# Patient Record
Sex: Female | Born: 1965
Health system: Southern US, Community
[De-identification: ages and names within clinical notes are randomized; demographics above are authoritative.]

## PROBLEM LIST (undated history)

## (undated) DIAGNOSIS — F329 Major depressive disorder, single episode, unspecified: Secondary | ICD-10-CM

## (undated) DIAGNOSIS — F32A Depression, unspecified: Secondary | ICD-10-CM

## (undated) DIAGNOSIS — R42 Dizziness and giddiness: Secondary | ICD-10-CM

## (undated) DIAGNOSIS — E079 Disorder of thyroid, unspecified: Secondary | ICD-10-CM

## (undated) DIAGNOSIS — R51 Headache: Secondary | ICD-10-CM

## (undated) DIAGNOSIS — T7840XA Allergy, unspecified, initial encounter: Secondary | ICD-10-CM

## (undated) DIAGNOSIS — M48061 Spinal stenosis, lumbar region without neurogenic claudication: Secondary | ICD-10-CM

## (undated) DIAGNOSIS — M79605 Pain in left leg: Secondary | ICD-10-CM

## (undated) DIAGNOSIS — K219 Gastro-esophageal reflux disease without esophagitis: Secondary | ICD-10-CM

## (undated) DIAGNOSIS — H9209 Otalgia, unspecified ear: Secondary | ICD-10-CM

## (undated) DIAGNOSIS — I739 Peripheral vascular disease, unspecified: Secondary | ICD-10-CM

## (undated) DIAGNOSIS — F419 Anxiety disorder, unspecified: Secondary | ICD-10-CM

## (undated) HISTORY — PX: OTHER SURGICAL HISTORY: SHX169

## (undated) HISTORY — DX: Anxiety disorder, unspecified: F41.9

## (undated) HISTORY — DX: Disorder of thyroid, unspecified: E07.9

## (undated) HISTORY — DX: Pain in left leg: M79.605

## (undated) HISTORY — DX: Dizziness and giddiness: R42

## (undated) HISTORY — DX: Headache: R51

## (undated) HISTORY — PX: CHOLECYSTECTOMY: SHX55

## (undated) HISTORY — DX: Peripheral vascular disease, unspecified: I73.9

## (undated) HISTORY — DX: Depression, unspecified: F32.A

## (undated) HISTORY — DX: Major depressive disorder, single episode, unspecified: F32.9

## (undated) HISTORY — DX: Allergy, unspecified, initial encounter: T78.40XA

## (undated) HISTORY — PX: VAGINAL HYSTERECTOMY: SUR661

## (undated) HISTORY — PX: BACK SURGERY: SHX140

## (undated) HISTORY — PX: ABDOMINAL HYSTERECTOMY: SHX81

---

## 2005-03-28 ENCOUNTER — Emergency Department (HOSPITAL_COMMUNITY): Admission: EM | Admit: 2005-03-28 | Discharge: 2005-03-28 | Payer: Self-pay | Admitting: Emergency Medicine

## 2005-04-03 ENCOUNTER — Encounter: Admission: RE | Admit: 2005-04-03 | Discharge: 2005-04-03 | Payer: Self-pay | Admitting: Emergency Medicine

## 2005-11-20 ENCOUNTER — Emergency Department (HOSPITAL_COMMUNITY): Admission: EM | Admit: 2005-11-20 | Discharge: 2005-11-20 | Payer: Self-pay | Admitting: Emergency Medicine

## 2005-11-25 ENCOUNTER — Emergency Department (HOSPITAL_COMMUNITY): Admission: EM | Admit: 2005-11-25 | Discharge: 2005-11-25 | Payer: Self-pay | Admitting: Family Medicine

## 2006-09-21 ENCOUNTER — Other Ambulatory Visit: Admission: RE | Admit: 2006-09-21 | Discharge: 2006-09-21 | Payer: Self-pay | Admitting: Gynecology

## 2007-09-27 ENCOUNTER — Other Ambulatory Visit: Admission: RE | Admit: 2007-09-27 | Discharge: 2007-09-27 | Payer: Self-pay | Admitting: Gynecology

## 2009-04-13 ENCOUNTER — Encounter (INDEPENDENT_AMBULATORY_CARE_PROVIDER_SITE_OTHER): Payer: Self-pay | Admitting: Obstetrics and Gynecology

## 2009-04-13 ENCOUNTER — Ambulatory Visit (HOSPITAL_COMMUNITY): Admission: RE | Admit: 2009-04-13 | Discharge: 2009-04-13 | Payer: Self-pay | Admitting: Obstetrics and Gynecology

## 2009-09-14 ENCOUNTER — Encounter: Admission: RE | Admit: 2009-09-14 | Discharge: 2009-09-14 | Payer: Self-pay | Admitting: Gastroenterology

## 2009-10-11 ENCOUNTER — Ambulatory Visit (HOSPITAL_COMMUNITY): Admission: RE | Admit: 2009-10-11 | Discharge: 2009-10-11 | Payer: Self-pay | Admitting: Gastroenterology

## 2009-11-23 ENCOUNTER — Ambulatory Visit (HOSPITAL_COMMUNITY): Admission: RE | Admit: 2009-11-23 | Discharge: 2009-11-23 | Payer: Self-pay | Admitting: General Surgery

## 2010-05-01 ENCOUNTER — Encounter: Admission: RE | Admit: 2010-05-01 | Discharge: 2010-05-01 | Payer: Self-pay | Admitting: Obstetrics and Gynecology

## 2010-05-20 ENCOUNTER — Encounter (INDEPENDENT_AMBULATORY_CARE_PROVIDER_SITE_OTHER): Payer: Self-pay | Admitting: Obstetrics and Gynecology

## 2010-05-20 ENCOUNTER — Ambulatory Visit (HOSPITAL_COMMUNITY): Admission: RE | Admit: 2010-05-20 | Discharge: 2010-05-21 | Payer: Self-pay | Admitting: Obstetrics and Gynecology

## 2011-01-18 LAB — CBC
Hemoglobin: 10.7 g/dL — ABNORMAL LOW (ref 12.0–15.0)
MCH: 28.7 pg (ref 26.0–34.0)
MCV: 81.8 fL (ref 78.0–100.0)
RBC: 3.73 MIL/uL — ABNORMAL LOW (ref 3.87–5.11)
WBC: 7.3 10*3/uL (ref 4.0–10.5)

## 2011-01-18 LAB — HCG, SERUM, QUALITATIVE: Preg, Serum: NEGATIVE

## 2011-01-19 LAB — CBC
HCT: 38.2 % (ref 36.0–46.0)
Hemoglobin: 13 g/dL (ref 12.0–15.0)
MCV: 80.7 fL (ref 78.0–100.0)
RBC: 4.73 MIL/uL (ref 3.87–5.11)
WBC: 5.3 10*3/uL (ref 4.0–10.5)

## 2011-01-20 LAB — COMPREHENSIVE METABOLIC PANEL
Albumin: 3.7 g/dL (ref 3.5–5.2)
BUN: 19 mg/dL (ref 6–23)
Calcium: 9.1 mg/dL (ref 8.4–10.5)
Creatinine, Ser: 0.63 mg/dL (ref 0.4–1.2)
Total Bilirubin: 0.4 mg/dL (ref 0.3–1.2)
Total Protein: 7.5 g/dL (ref 6.0–8.3)

## 2011-01-20 LAB — DIFFERENTIAL
Eosinophils Absolute: 0 10*3/uL (ref 0.0–0.7)
Eosinophils Relative: 0 % (ref 0–5)
Lymphocytes Relative: 34 % (ref 12–46)
Lymphs Abs: 2.2 10*3/uL (ref 0.7–4.0)
Neutrophils Relative %: 59 % (ref 43–77)

## 2011-01-20 LAB — CBC
HCT: 35.3 % — ABNORMAL LOW (ref 36.0–46.0)
Hemoglobin: 11.8 g/dL — ABNORMAL LOW (ref 12.0–15.0)
MCHC: 33.6 g/dL (ref 30.0–36.0)
MCV: 79.3 fL (ref 78.0–100.0)
Platelets: 283 10*3/uL (ref 150–400)
RDW: 12.8 % (ref 11.5–15.5)

## 2011-02-10 LAB — CBC
MCV: 80.1 fL (ref 78.0–100.0)
RBC: 4.47 MIL/uL (ref 3.87–5.11)
WBC: 4.2 10*3/uL (ref 4.0–10.5)

## 2011-03-18 NOTE — Op Note (Signed)
Kendra Rodriguez, Kendra Rodriguez      ACCOUNT NO.:  [id number]   MEDICAL RECORD NO.:  [id number]          PATIENT TYPE:  AMB   LOCATION:  SDC                           FACILITY:  [facility]   PHYSICIAN:  Osborn Coho, M.D.   DATE OF BIRTH:  [birth year]   DATE OF PROCEDURE:  04/13/2009  DATE OF DISCHARGE:                               OPERATIVE REPORT   PREOPERATIVE DIAGNOSES:  1. Menorrhagia.  2. Endometrial and endocervical polyp.   POSTOPERATIVE DIAGNOSES:  1. Menorrhagia.  2. Endometrial and endocervical polyp.   PROCEDURES:  1. Hysteroscopy.  2. Dilation and curettage.  3. Polypectomy.  4. NovaSure ablation.   SURGEON:  Osborn Coho, MD   ANESTHESIA:  General via LMA.   SPECIMENS TO PATHOLOGY:  1. Endometrial curettings.  2. Endocervical polyps.   HYSTEROSCOPIC FLUID DEFICIT:  LR 35 mL, fluids 1400 mL.   URINE OUTPUT:  Quantity sufficient via straight cath prior to procedure.   ESTIMATED BLOOD LOSS:  Minimal.   COMPLICATIONS:  None.   FINDINGS:  Approximately 1-2 cm endocervical polyp.  Uterus sounded to 9-  1/2 cm.  Cervical length measured 3-1/2 cm.  Cavity length measured 6  cm.  Cavity width measured 4.7 cm.  Power for the ablation was 55 watts  and length of the ablation was 45 seconds.   PROCEDURE:  The patient was taken to the operating room after risks,  benefits, and alternatives discussed with the patient.  The patient  verbalized understanding, consent signed and witnessed.  This was done  via translation with her daughter and previously with Day Surgery staff  and head of the translator.  The patient was placed under general  anesthesia and prepped and draped in the normal sterile fashion in the  dorsal lithotomy position.  A bivalve speculum was placed in the  patient's vagina and a paracervical block administered using a total of  10 mL of 1% lidocaine.  The anterior lip of the cervix was grasped with  single-tooth tenacula and the measurements as  noted above.  The  hysteroscope was introduced after polypectomy performed on the  endocervical polyp which was clearly visualized after the speculum was  placed.  This was protruding out of this external cervical os.  The  hysteroscope was introduced and no obvious intracavitary lesions were  noted.  The hysteroscope was removed and curettage was performed and a  gritty texture was noted.  The NovaSure instrument was then placed into  the endometrial cavity and ablation performed without difficulty with  the measurements as noted above.  The NovaSure instrument was removed  and hysteroscope replaced into the endometrial cavity and good ablation  results were noted.  All instruments were removed.  There was good  hemostasis at the tenaculum site.  Count was correct.  The patient  tolerated the procedure well and is currently awaiting transfer to the  recovery room in good condition.      Osborn Coho, M.D.  Electronically Signed     AR/MEDQ  D:  04/13/2009  T:  04/14/2009  Job:  [id number]

## 2011-03-20 ENCOUNTER — Other Ambulatory Visit: Payer: Self-pay | Admitting: Dermatology

## 2011-05-15 ENCOUNTER — Other Ambulatory Visit: Payer: Self-pay | Admitting: Internal Medicine

## 2011-05-15 DIAGNOSIS — R1013 Epigastric pain: Secondary | ICD-10-CM

## 2011-05-22 ENCOUNTER — Ambulatory Visit
Admission: RE | Admit: 2011-05-22 | Discharge: 2011-05-22 | Disposition: A | Discharge status: Home / Self Care | Payer: BC Managed Care – PPO | Source: Ambulatory Visit | Attending: Internal Medicine | Admitting: Internal Medicine

## 2011-05-22 DIAGNOSIS — R1013 Epigastric pain: Secondary | ICD-10-CM

## 2011-05-29 ENCOUNTER — Other Ambulatory Visit: Payer: Self-pay | Admitting: Internal Medicine

## 2011-05-29 DIAGNOSIS — N6321 Unspecified lump in the left breast, upper outer quadrant: Secondary | ICD-10-CM

## 2011-06-04 ENCOUNTER — Ambulatory Visit
Admission: RE | Admit: 2011-06-04 | Discharge: 2011-06-04 | Disposition: A | Discharge status: Home / Self Care | Payer: BC Managed Care – PPO | Source: Ambulatory Visit | Attending: Internal Medicine | Admitting: Internal Medicine

## 2011-06-04 DIAGNOSIS — N6321 Unspecified lump in the left breast, upper outer quadrant: Secondary | ICD-10-CM

## 2011-08-21 ENCOUNTER — Other Ambulatory Visit: Payer: Self-pay

## 2011-08-21 DIAGNOSIS — M79609 Pain in unspecified limb: Secondary | ICD-10-CM

## 2011-09-08 ENCOUNTER — Encounter: Payer: Self-pay | Admitting: Vascular Surgery

## 2011-09-15 ENCOUNTER — Encounter: Payer: Self-pay | Admitting: Vascular Surgery

## 2011-09-16 ENCOUNTER — Ambulatory Visit (INDEPENDENT_AMBULATORY_CARE_PROVIDER_SITE_OTHER): Payer: BC Managed Care – PPO | Admitting: Vascular Surgery

## 2011-09-16 ENCOUNTER — Other Ambulatory Visit (INDEPENDENT_AMBULATORY_CARE_PROVIDER_SITE_OTHER): Payer: BC Managed Care – PPO | Admitting: *Deleted

## 2011-09-16 ENCOUNTER — Encounter: Payer: Self-pay | Admitting: Vascular Surgery

## 2011-09-16 VITALS — BP 112/71 | HR 77 | Resp 18 | Ht 63.0 in | Wt 192.0 lb

## 2011-09-16 DIAGNOSIS — M79609 Pain in unspecified limb: Secondary | ICD-10-CM

## 2011-09-16 NOTE — Progress Notes (Signed)
Subjective:     Patient ID: Kendra Rodriguez, female   DOB: April 08, 1966, 45 y.o.   MRN: [id number]  HPI this 45 year old female patient was referred for evaluation of bilateral leg pain . Her daughter accompanies her who speaks [place] and serves as a Nurse, learning disability. The patient has pain in both lower extremities sometimes worse on the right sometimes worse on the left. This occurs only while sitting or lying supine but never while ambulating. Ambulation relieved the pain. She has no history of back pain or lumbar disc disease. She has no history of cellulitis, infection, gangrene, deep vein thrombosis, or thrombophlebitis. The discomfort begins in the hips and extends down into the thighs. She occasionally has numbness in the feet. Assessment going on for 3-4 months and is worse than it was at the onset.  Past Medical History  Diagnosis Date  . Dizziness   . Acute sinusitis   . Peripheral vascular disease   . Leg pain, left     with swelling    History  Substance Use Topics  . Smoking status: Never Smoker   . Smokeless tobacco: Never Used  . Alcohol Use: No    Family History  Problem Relation Age of Onset  . Hypertension Mother   . Arthritis Mother     No Known Allergies  Current outpatient prescriptions:dexlansoprazole (DEXILANT) 60 MG capsule, Take 60 mg by mouth daily.  , Disp: , Rfl: ;  sulfamethoxazole-trimethoprim (BACTRIM DS) 800-160 MG per tablet, Take 1 tablet by mouth every 12 (twelve) hours.  , Disp: , Rfl: ;  triamcinolone (NASACORT) 55 MCG/ACT nasal inhaler, Place 2 sprays into the nose daily.  , Disp: , Rfl:   BP 112/71  Pulse 77  Resp 18  Ht 5\' 3"  (1.6 m)  Wt 192 lb (87.091 kg)  BMI 34.01 kg/m2  Body mass index is 34.01 kg/(m^2).        Review of Systems she complains of dizziness, headaches, decreased vision, joint pain, chest pain, chest tightness, dyspnea on exertion, urinary frequency, dysuria, reflux esophagitis, and diarrhea. All other systems are negative  and a complete review of    Objective:   Physical Exam or pressure 112/71 heart rate 77 respirations 18 General she is well-developed well-nourished female in no apparent distress. She is alert and oriented x3 HEENT normal for age Lungs no rhonchi or wheezing Cardiovascular regular rhythm no murmurs carotid pulses 3+ no bruits Abdomen soft nontender with no masses Neurologic normal Joints no obvious deformities Skin free of rashes Lower extremity exam reveals 3+ femoral popliteal dorsalis pedis and posterior tibial pulses palpable. The feet are warm and well perfused.  Today I ordered lower extremity arterial Dopplers which I reviewed and interpreted. She has excellent triphasic flow in both feet with ABIs exceeding 1.1 bilaterally-this is a normal study    Assessment:    bilateral radicular leg pain relieved by ambulation-sounds like nerve compression syndrome no evidence of vascular occlusive disease    Plan:     Patient will return to see her health care provider to determine if referral to orthopedic or neuro surgeon is indicated to rule out lumbar spine disease No need for further vascular evaluation

## 2011-10-07 ENCOUNTER — Other Ambulatory Visit: Payer: Self-pay | Admitting: Internal Medicine

## 2011-10-09 ENCOUNTER — Ambulatory Visit
Admission: RE | Admit: 2011-10-09 | Discharge: 2011-10-09 | Disposition: A | Discharge status: Home / Self Care | Payer: BC Managed Care – PPO | Source: Ambulatory Visit | Attending: Internal Medicine | Admitting: Internal Medicine

## 2012-01-27 ENCOUNTER — Encounter (INDEPENDENT_AMBULATORY_CARE_PROVIDER_SITE_OTHER): Payer: BC Managed Care – PPO | Admitting: Obstetrics and Gynecology

## 2012-01-27 DIAGNOSIS — N644 Mastodynia: Secondary | ICD-10-CM

## 2012-01-27 DIAGNOSIS — N390 Urinary tract infection, site not specified: Secondary | ICD-10-CM

## 2012-01-27 DIAGNOSIS — B373 Candidiasis of vulva and vagina: Secondary | ICD-10-CM

## 2012-01-27 DIAGNOSIS — B3731 Acute candidiasis of vulva and vagina: Secondary | ICD-10-CM

## 2012-01-28 ENCOUNTER — Ambulatory Visit (INDEPENDENT_AMBULATORY_CARE_PROVIDER_SITE_OTHER): Payer: BC Managed Care – PPO | Admitting: Physician Assistant

## 2012-01-28 ENCOUNTER — Other Ambulatory Visit: Payer: Self-pay | Admitting: Obstetrics and Gynecology

## 2012-01-28 VITALS — BP 127/69 | HR 77 | Temp 97.8°F | Resp 16 | Ht 62.75 in | Wt 194.0 lb

## 2012-01-28 DIAGNOSIS — Z23 Encounter for immunization: Secondary | ICD-10-CM

## 2012-01-28 DIAGNOSIS — M79609 Pain in unspecified limb: Secondary | ICD-10-CM

## 2012-01-28 DIAGNOSIS — S61209A Unspecified open wound of unspecified finger without damage to nail, initial encounter: Secondary | ICD-10-CM

## 2012-01-28 DIAGNOSIS — M79646 Pain in unspecified finger(s): Secondary | ICD-10-CM

## 2012-01-28 DIAGNOSIS — N644 Mastodynia: Secondary | ICD-10-CM

## 2012-01-28 NOTE — Progress Notes (Signed)
  Subjective:    Patient ID: Kendra Rodriguez, female    DOB: 1966-03-01, 46 y.o.   MRN: [id number]  HPI Presents with pain and injury to the right index finger about one hour ago.  She was making strawberry syrup at home and reached into the spinning blender to push a frozen strawberry down toward the blades.  Does not recall the date of her last tetanus.  Her daughter is present and translates.  She is from [place], [place] and has 5 children.   Review of Systems As above.    Objective:   Physical Exam VS noted.  WDWN [place] female, awake, alert, oriented, NAD. Laceration to the tip of the right middle finger.  No significant nail injury.  FROM.  Normal sensation.  Capillary refill <3 seconds.  Verbal consent obtained from the patient.  Local anesthesia with 4cc Lidocaine 2% without epinephrine.  Wound scrubbed with soap and water and rinsed.  Wound closed with 5 5-0 Prolene SI sutures.  Wound cleansed and dressed.  Tdap given.    Assessment & Plan:   1. Wound, open, finger    2. Pain in finger    3. Need for Tdap vaccination  Tdap vaccine greater than or equal to 7yo IM   Local wound care.  RTC 7-10 days for suture removal, sooner if needed.

## 2012-01-28 NOTE — Patient Instructions (Signed)
CUIDADO DE LA HERIDA (Wound Care) Por favor vuelta en 7-10 das para hacer sus puntadas/ grapas quitar o ms pronto si usted tiene preocupaciones. . Mantenga el rea limpia y seca por 24 horas. No quite el vendaje, si est aplicado. . Despus de 24 horas, quita el vendaje y limpia la herida suavemente con cualquier tipo de jabn y agua tibia. Reaplique un vendaje nuevo despus de limpiar herida, si est dirigido. . Limpie la herida con el jabn y agua 1 a 2 veces al da hasta se quitan las puntadas. . No aplique ninguna ungentos ni cremas a la herida mientras que las puntadas estn en lugar, pues sta puede causar curativo retrasado. . Notifique la oficina si usted tiene el siguiente de los muestras de la infeccin: Hinchazn, enrojecimiento, calor, drenaje del pus, de la fiebre > 101.0 F . Notifique la oficina si usted tiene la sangra excesiva eso no para despus de 15-20 minutos de presin firme y constante.   

## 2012-02-04 ENCOUNTER — Ambulatory Visit (INDEPENDENT_AMBULATORY_CARE_PROVIDER_SITE_OTHER): Payer: BC Managed Care – PPO | Admitting: Physician Assistant

## 2012-02-04 ENCOUNTER — Ambulatory Visit
Admission: RE | Admit: 2012-02-04 | Discharge: 2012-02-04 | Disposition: A | Discharge status: Home / Self Care | Payer: BC Managed Care – PPO | Source: Ambulatory Visit | Attending: Obstetrics and Gynecology | Admitting: Obstetrics and Gynecology

## 2012-02-04 VITALS — BP 100/63 | HR 76 | Temp 97.7°F | Resp 18 | Ht 62.0 in | Wt 193.2 lb

## 2012-02-04 DIAGNOSIS — IMO0002 Reserved for concepts with insufficient information to code with codable children: Secondary | ICD-10-CM

## 2012-02-04 DIAGNOSIS — N644 Mastodynia: Secondary | ICD-10-CM

## 2012-02-04 DIAGNOSIS — Z4802 Encounter for removal of sutures: Secondary | ICD-10-CM

## 2012-02-04 NOTE — Progress Notes (Signed)
   Patient ID: Kendra Rodriguez MRN: [id number], DOB: [birth year] 46 y.o. Date of Encounter: 02/04/2012, 3:06 PM  Primary Physician: Bebe Liter, NP, NP  Chief Complaint: Suture removal    See note from 01/28/12   HPI: 46 y.o. y/o female with injury to left 3rd digit Here for suture removal s/p placement on 01/28/12 Doing well No issues/complaints Afebrile/ No chills No erythema No pain Able to move without difficulty Normal sensation  Past Medical History  Diagnosis Date  . Dizziness   . Acute sinusitis   . Peripheral vascular disease   . Leg pain, left     with swelling     Home Meds: Prior to Admission medications   Medication Sig Start Date End Date Taking? Authorizing Provider  dexlansoprazole (DEXILANT) 60 MG capsule Take 60 mg by mouth daily.     Yes Historical Provider, MD  Cholecalciferol 5000 UNITS capsule Take 10,000 Units by mouth once a week.    Historical Provider, MD  ciprofloxacin (CIPRO) 500 MG tablet Take 500 mg by mouth 2 (two) times daily.    Historical Provider, MD    Allergies: No Known Allergies  Physical Exam: Blood pressure 100/63, pulse 76, temperature 97.7 F (36.5 C), temperature source Oral, resp. rate 18, height 5\' 2"  (1.575 m), weight 193 lb 3.2 oz (87.635 kg)., Body mass index is 35.34 kg/(m^2). General: Well developed, well nourished, in no acute distress. Head: Normocephalic, atraumatic, sclera non-icteric, no xanthomas, nares are without discharge.  Neck: Supple. Lungs: Breathing is unlabored. Heart: Normal rate. Msk:  Strength and tone appear normal for age. Wound: Left 3rd digit wound well healed without erythema, swelling, or tenderness to palpation. FROM and 5/5 strength with normal sensation throughout including 2 point discrimination Skin: See above, otherwise dry without rash or erythema. Extremities: No clubbing or cyanosis. No edema. Neuro: Alert and oriented X 3. Moves all extremities spontaneously.  Psych:  Responds to  questions appropriately with a normal affect.   PROCEDURE: Verbal consent obtained. 5 simple interrupted sutures removed without difficulty.  Assessment and Plan: 46 y.o. y/o female here for suture removal for wound described above. -Sutures removed per above -Wound resolved -RTC prn  Signed, Eula Listen, PA-C 02/04/2012 3:06 PM

## 2012-10-07 ENCOUNTER — Ambulatory Visit (HOSPITAL_COMMUNITY): Payer: BC Managed Care – PPO | Admitting: Psychiatry

## 2012-11-24 ENCOUNTER — Encounter (HOSPITAL_COMMUNITY): Payer: Self-pay | Admitting: Psychiatry

## 2012-11-24 ENCOUNTER — Ambulatory Visit (INDEPENDENT_AMBULATORY_CARE_PROVIDER_SITE_OTHER): Payer: BC Managed Care – PPO | Admitting: Psychiatry

## 2012-11-24 VITALS — BP 123/75 | HR 71 | Ht 63.0 in | Wt 195.2 lb

## 2012-11-24 DIAGNOSIS — F3289 Other specified depressive episodes: Secondary | ICD-10-CM

## 2012-11-24 DIAGNOSIS — F329 Major depressive disorder, single episode, unspecified: Secondary | ICD-10-CM

## 2012-11-24 DIAGNOSIS — F32A Depression, unspecified: Secondary | ICD-10-CM

## 2012-11-24 NOTE — Progress Notes (Signed)
Chief complaint I was referred from my primary care physician for depression.  History presenting illness Patient is 47 year old Spanish-speaking married female who is referred from her primary care physician Dr. Shelda Pal for depression.  Patient does not speak [place] and information was obtained from translator.  Patient told that she has been depressed for past 8 months due to multiple stressors in her life.  Her main concern is her children.  She has 5 children and most of them have a lot of issues.  One son is in jail due to domestic violence, other son has relationship issues and 57 year old daughter has behavior problem.  Patient also endorse financial distress and her physical health issues.  She is overweight and diagnosed with high cholesterol, prediabetic, thyroid problem and chronic pain.  Patient admitted worries about her future.  She endorse for past 8 months she's been feeling very sad with decreased sleep having crying spells and afraid to leave her home.  She gets easily tired and sometime gets irritable and angry.  She admitted screaming at her family members especially on husband.  Her primary care physician started her on Celexa which she took for 3 months but she stopped after feeling more tired and feel like a zombie.  She stopped taking the medication and trying to cope on her own to deal with the depression.  Patient admitted at least one time having passive suicidal thinking 7 months ago and at the same time she is here voices but denies any recent active or passive suicidal thoughts or any hallucination.  Patient is not interested to take any antidepressant however she is more interested to see therapist and counselor.  Patient denies any paranoia, mania or any active psychosis.  She denies any aggression or any violence.  Past psychiatric history Patient endorse history of postpartum depression 15 years ago when she was living in [place].  She was treated with  antidepressant but do not remember the dosage.  Her medications were given by psychiatrist.  She denies any history of suicidal attempt or any inpatient psychiatric treatment.  Patient denies any history of psychosis mania or any aggression or violence.  Current psychiatric medication Patient was given Celexa by her primary care physician 7 months ago but she stopped Dr. 3 months due to side effects.  She is currently not taking any antidepressant.  Medical history Patient has obesity, prediabetic condition, high cholesterol, thyroid problem, chronic pain and vitamin D deficiency.  She sees tried internal medicine associates.  Family history Patient denies any history of family psychiatric illness.  Psychosocial history Patient was born and raised in [place].  She moved to the [place] at age 18.  At that time she got manic.  She lives in [place] for 13 years.  She moved to [place] 12 years ago.  She has 5 children.  Patient denies any history of physical sexual or any verbal abuse.  Patient husband works in [employer].  Education work history Patient has no formal schooling education.  She's not working.  Alcohol and substance use history Patient denies any history of alcohol or any illegal substance use.  Review of Systems  Constitutional: Negative.        Obesity  Musculoskeletal: Positive for back pain.  Skin: Negative.   Neurological: Positive for headaches.  Psychiatric/Behavioral: Positive for depression. Negative for suicidal ideas, hallucinations and substance abuse. The patient is nervous/anxious and has insomnia.    Mental status examination Patient is obese female who is casually  dressed and fairly groomed.  She maintained fair eye contact.  Her speech is soft clear and coherent.  Her thought process is also logical linear and goal-directed.  She described her mood is anxious and depressed and her affect is constricted.  She denies any active or passive suicidal  thoughts or homicidal thoughts.  She denies any auditory or visual hallucination.  There were no flight of ideas or any loose association.  Her attention and concentration is fair.  She's alert and oriented x3.  There were no delusion obsession or any paranoid thinking.  Her insight judgment and impulse control is okay.  Assessment Axis I depressive disorder NOS Axis II deferred Axis III see medical history Axis IV mild to moderate Axis V 60-65  Plan I talked with the patient with the help of translator about considering antidepressant however patient is very reluctant to take any antidepressant.  She is open for counseling and therapy.  She reported that she need skills to deal with her stressors.  We will schedule appointment with therapist in this office.  I recommend if she feels worsening of the symptoms that anytime having active or passive suicidal thoughts and she need to call us immediately or go to local emergency room.  We talk about relapse into depression if she does not get any treatment or help.  She acknowledged.  I will not schedule this patient this Clinical research associate as patient like to see counselor.  We will schedule appointment with the therapist.  I recommend to call us if she is any question or concern.  Time spent 60 minutes.

## 2013-01-04 ENCOUNTER — Ambulatory Visit (HOSPITAL_COMMUNITY): Payer: Self-pay | Admitting: Marriage and Family Therapist

## 2013-01-11 ENCOUNTER — Ambulatory Visit (HOSPITAL_COMMUNITY): Payer: Self-pay | Admitting: Marriage and Family Therapist

## 2013-02-01 ENCOUNTER — Other Ambulatory Visit: Payer: Self-pay | Admitting: Gastroenterology

## 2013-02-01 DIAGNOSIS — R1031 Right lower quadrant pain: Secondary | ICD-10-CM

## 2013-02-04 ENCOUNTER — Ambulatory Visit
Admission: RE | Admit: 2013-02-04 | Discharge: 2013-02-04 | Disposition: A | Discharge status: Home / Self Care | Payer: BC Managed Care – PPO | Source: Ambulatory Visit | Attending: Gastroenterology | Admitting: Gastroenterology

## 2013-02-04 DIAGNOSIS — R1031 Right lower quadrant pain: Secondary | ICD-10-CM

## 2013-06-14 ENCOUNTER — Other Ambulatory Visit: Payer: Self-pay

## 2013-06-14 DIAGNOSIS — Z1231 Encounter for screening mammogram for malignant neoplasm of breast: Secondary | ICD-10-CM

## 2013-06-27 ENCOUNTER — Ambulatory Visit: Payer: Self-pay

## 2013-06-28 ENCOUNTER — Ambulatory Visit
Admission: RE | Admit: 2013-06-28 | Discharge: 2013-06-28 | Disposition: A | Discharge status: Home / Self Care | Payer: BC Managed Care – PPO | Source: Ambulatory Visit

## 2013-06-28 DIAGNOSIS — Z1231 Encounter for screening mammogram for malignant neoplasm of breast: Secondary | ICD-10-CM

## 2013-12-23 ENCOUNTER — Ambulatory Visit (INDEPENDENT_AMBULATORY_CARE_PROVIDER_SITE_OTHER): Payer: BC Managed Care – PPO

## 2013-12-23 ENCOUNTER — Encounter (INDEPENDENT_AMBULATORY_CARE_PROVIDER_SITE_OTHER): Payer: Self-pay

## 2013-12-23 DIAGNOSIS — M79609 Pain in unspecified limb: Secondary | ICD-10-CM

## 2013-12-23 DIAGNOSIS — R209 Unspecified disturbances of skin sensation: Secondary | ICD-10-CM

## 2013-12-23 NOTE — Procedures (Signed)
     HISTORY:  Shon Milletva Raineri is a 48 year old patient with a history of severe left lower extremity discomfort and numbness. The patient had a laser vein reduction procedure, and then began to having significant discomfort and numbness in the left leg. The patient is being evaluated for possible neuropathy.  NERVE CONDUCTION STUDIES:  Nerve conduction studies were performed on both lower extremities. The distal motor latencies and motor amplitudes for the peroneal and posterior tibial nerves were within normal limits. The nerve conduction velocities for these nerves were also normal. The H reflex latencies were normal. The sensory latencies for the peroneal nerves were within normal limits. The medial and lateral plantar sensory latencies were symmetric and normal.   EMG STUDIES:  EMG evaluation was not performed.  IMPRESSION:  Nerve conduction studies done on both lower extremities were within normal limits. No evidence of a peripheral neuropathy is seen. There is no evidence of a tarsal tunnel syndrome on either side. If a lumbosacral radiculopathy is clinically suspected, we would recommend EMG evaluation of the affected extremity.  Marlan Palau. Keith Jrue Yambao MD 12/23/2013 8:57 AM  [facility] [address] [place], [place] [id number]  Phone [phone] Fax [phone]

## 2013-12-28 ENCOUNTER — Ambulatory Visit (INDEPENDENT_AMBULATORY_CARE_PROVIDER_SITE_OTHER): Payer: BC Managed Care – PPO | Admitting: Family Medicine

## 2013-12-28 VITALS — BP 102/60 | HR 108 | Temp 99.2°F | Resp 18 | Ht 63.0 in | Wt 198.0 lb

## 2013-12-28 DIAGNOSIS — J020 Streptococcal pharyngitis: Secondary | ICD-10-CM

## 2013-12-28 DIAGNOSIS — R6889 Other general symptoms and signs: Secondary | ICD-10-CM

## 2013-12-28 DIAGNOSIS — J029 Acute pharyngitis, unspecified: Secondary | ICD-10-CM

## 2013-12-28 LAB — POCT RAPID STREP A (OFFICE): RAPID STREP A SCREEN: POSITIVE — AB

## 2013-12-28 LAB — POCT INFLUENZA A/B
INFLUENZA A, POC: NEGATIVE
INFLUENZA B, POC: NEGATIVE

## 2013-12-28 MED ORDER — AMOXICILLIN 875 MG PO TABS: 875.0000 mg | ORAL_TABLET | Freq: Two times a day (BID) | ORAL | Status: DC

## 2013-12-28 NOTE — Patient Instructions (Signed)
Drink plenty of fluids and given enough rest. Take the next couple of days off of work.  Take the antibiotics one twice daily for infection  Take Tylenol or ibuprofen if needed for pain or fever  Return if worse  Angina por estreptococos (Strep Throat)  La angina estreptocccica es una infeccin en la garganta causada por una bacteria llamada Streptococcus pyogenes. El mdico la llamar "amigdalitis" o "faringitis" estreptocccica, segn haya signos de inflamacin en las amgdalas o en la zona posterior de la garganta. Es ms frecuente en nios entre los 5 y los 15 aos durante los meses de fro, Biomedical engineerpero puede ocurrir a Dealerpersonas de Actuarycualquier edad. La infeccin se transmite de persona a persona (contagio) a travs de la tos, el estornudo u otro contacto cercano.  SNTOMAS  Fiebre o escalofros.  La garganta o las Goodyear Tireamgdalas le duelen y estn inflamadas.  Dolor o dificultad para tragar.  Puntos blancos o amarillos en las amgdalas o la garganta.  Ganglios linfticos hinchados a los lados del cuello o debajo de la Wynotmandbula.  Erupcin rojiza en todo el cuerpo (poco comn). DIAGNSTICO Diferentes infecciones pueden causar los mismos sntomas. Para confirmar el diagnstico deber Assuranthacerse anlisis, y as podrn prescribirle el tratamiento adecuado. La "prueba rpida de estreptococo" ayudar al mdico a hacer el diagnstico en algunos minutos. Si no se dispone de la prueba, se har un rpido hisopado de la zona afectada para hacer un cultivo de las secreciones de la garganta. Si se hace un cultivo, los resultados estarn disponibles en Henry Scheinuno o dos das.  TRATAMIENTO El estreptococo de garganta se trata con antibiticos. INSTRUCCIONES PARA EL CUIDADO DOMICILIARIO  Haga grgaras con 1 cucharadita de sal en 1 taza de agua tibia, 3  4 veces por da o cuando lo crea necesario para sentirse mejor.  Los miembros de la familia que tambin tengan dolor en la garganta deben ser evaluados y tratados con  antibiticos si tienen la infeccin.  Asegrese de que todas las personas de su casa se laven Longs Drug Storesbien las manos.  No comparta alimentos, tazas ni utensilios personales que puedan contagiar la infeccin.  Coma alimentos blandos hasta que el dolor de garganta mejore.  Beba gran cantidad de lquido para mantener la orina de tono claro o color amarillo plido. Esto ayudar a Agricultural engineerprevenir la deshidratacin.  Debe hacer reposo.  No concurra a la escuela o la trabajo hasta que haya tomado los antibiticos durante 24 horas.  Utilice los medicamentos de venta libre o de prescripcin para Chief Technology Officerel dolor, Environmental health practitionerel malestar o la Millerfiebre, segn se lo indique el profesional que lo asiste.  Si le han recetado medicamentos, tmelos segn las indicaciones. Finalice la prescripcin completa, aunque se sienta mejor. SOLICITE ATENCIN MDICA SI:  Los ganglios del cuello siguen agrandados.  Aparece una erupcin cutnea, tos o dolor de odos.  Tiene un catarro verde, amarillo amarronado o esputo sanguinolento.  Siente dolor o Dentistmalestar que no puede controlar con los medicamentos.  Los sntomas parecen empeorar en vez de mejorar. SOLICITE ATENCIN MDICA DE INMEDIATO SI:  Presenta algn nuevo sntoma, como vmitos, dolor de cabeza intenso, rigidez o dolor en el cuello, dolor en el pecho o problemas respiratorios o dificultad para tragar.  Presenta dolor de garganta intenso, babeo o cambios en la voz.  Siente que el cuello se hincha o la piel de esa zona se vuelve roja y sensible.  Tiene fiebre.  Tiene signos de deshidratacin, como fatiga, boca seca y disminucin de la [place].  Comienza a  sentir mucho sueo, o no puede despertarse bien. Document Released: 07/30/2005 Document Revised: 10/06/2012 [facility] Patient Information 2014 [place], [place].

## 2013-12-28 NOTE — Progress Notes (Signed)
Subjective: 48 year old lady who speaks only a limited amount of [place]. Her daughter who speaks English is with her, and my Geophysicist/field seismologistassistant can speaks [place]. The patient has been sick for 2 days with lots of chills. She had a little a sensation in her years. She has a sore throat but not much in the way of runny nose or cough. She's had a headache and body aches. Does not smoke.  Objective: Pleasant lady in no major distress. TMs normal on the left. Lots of tympanosclerosis on the right. Throat is red, though not severely. No exudate. Enlarged tonsils. Neck supple with moderate nodes. Chest clear to auscultation. Heart regular without murmurs.  Assessment: Pharyngitis Flulike illness  Plan: Swab and strept  Results for orders placed in visit on 12/28/13  POCT RAPID STREP A (OFFICE)      Result Value Ref Range   Rapid Strep A Screen Positive (*) Negative  POCT INFLUENZA A/B      Result Value Ref Range   Influenza A, POC Negative     Influenza B, POC Negative

## 2014-01-30 ENCOUNTER — Ambulatory Visit (INDEPENDENT_AMBULATORY_CARE_PROVIDER_SITE_OTHER): Payer: BC Managed Care – PPO | Admitting: Physician Assistant

## 2014-01-30 ENCOUNTER — Ambulatory Visit: Payer: BC Managed Care – PPO

## 2014-01-30 VITALS — BP 140/86 | HR 66 | Temp 97.8°F | Resp 16 | Ht 62.75 in | Wt 198.8 lb

## 2014-01-30 DIAGNOSIS — M79605 Pain in left leg: Secondary | ICD-10-CM

## 2014-01-30 DIAGNOSIS — H9209 Otalgia, unspecified ear: Secondary | ICD-10-CM

## 2014-01-30 DIAGNOSIS — R937 Abnormal findings on diagnostic imaging of other parts of musculoskeletal system: Secondary | ICD-10-CM

## 2014-01-30 DIAGNOSIS — J302 Other seasonal allergic rhinitis: Secondary | ICD-10-CM

## 2014-01-30 DIAGNOSIS — M79609 Pain in unspecified limb: Secondary | ICD-10-CM

## 2014-01-30 DIAGNOSIS — I83891 Varicose veins of right lower extremities with other complications: Secondary | ICD-10-CM | POA: Insufficient documentation

## 2014-01-30 DIAGNOSIS — H6980 Other specified disorders of Eustachian tube, unspecified ear: Secondary | ICD-10-CM

## 2014-01-30 DIAGNOSIS — J309 Allergic rhinitis, unspecified: Secondary | ICD-10-CM

## 2014-01-30 DIAGNOSIS — H9201 Otalgia, right ear: Secondary | ICD-10-CM

## 2014-01-30 MED ORDER — TRIAMCINOLONE ACETONIDE 55 MCG/ACT NA AERO: 2.0000 | INHALATION_SPRAY | Freq: Every day | NASAL | Status: DC

## 2014-01-30 MED ORDER — TRAMADOL HCL 50 MG PO TABS: 50.0000 mg | ORAL_TABLET | Freq: Three times a day (TID) | ORAL | Status: DC | PRN

## 2014-01-30 NOTE — Patient Instructions (Signed)
Use nose spray for the allergies which is causing increase in fluid in your ear. Waiting on a referral to ortho for back/leg pain - take your xray to the appt.
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
## 2014-02-08 NOTE — Progress Notes (Signed)
CPE with Kendra LennertSarah Weber on 5/27 @ 2:45pm.

## 2014-03-29 ENCOUNTER — Encounter: Payer: Self-pay | Admitting: Physician Assistant

## 2014-03-29 ENCOUNTER — Ambulatory Visit (INDEPENDENT_AMBULATORY_CARE_PROVIDER_SITE_OTHER): Payer: BC Managed Care – PPO | Admitting: Physician Assistant

## 2014-03-29 VITALS — BP 110/74 | HR 80 | Temp 97.8°F | Resp 16 | Ht 62.75 in | Wt 205.0 lb

## 2014-03-29 DIAGNOSIS — Z0000 Encounter for general adult medical examination without abnormal findings: Secondary | ICD-10-CM

## 2014-03-29 DIAGNOSIS — Z13220 Encounter for screening for lipoid disorders: Secondary | ICD-10-CM

## 2014-03-29 DIAGNOSIS — J329 Chronic sinusitis, unspecified: Secondary | ICD-10-CM

## 2014-03-29 DIAGNOSIS — R5381 Other malaise: Secondary | ICD-10-CM

## 2014-03-29 DIAGNOSIS — R5383 Other fatigue: Secondary | ICD-10-CM

## 2014-03-29 DIAGNOSIS — Z113 Encounter for screening for infections with a predominantly sexual mode of transmission: Secondary | ICD-10-CM

## 2014-03-29 DIAGNOSIS — Z01419 Encounter for gynecological examination (general) (routine) without abnormal findings: Secondary | ICD-10-CM

## 2014-03-29 LAB — POCT URINALYSIS DIPSTICK
Bilirubin, UA: NEGATIVE
GLUCOSE UA: NEGATIVE
Ketones, UA: NEGATIVE
Leukocytes, UA: NEGATIVE
NITRITE UA: NEGATIVE
PROTEIN UA: NEGATIVE
SPEC GRAV UA: 1.025
UROBILINOGEN UA: 0.2
pH, UA: 6

## 2014-03-29 LAB — LIPID PANEL
Cholesterol: 198 mg/dL (ref 0–200)
HDL: 46 mg/dL (ref >39)
LDL CALC: 79 mg/dL (ref 0–99)
TRIGLYCERIDES: 363 mg/dL — AB (ref <150)
Total CHOL/HDL Ratio: 4.3 Ratio
VLDL: 73 mg/dL — AB (ref 0–40)

## 2014-03-29 LAB — CBC WITH DIFFERENTIAL/PLATELET
BASOS ABS: 0 10*3/uL (ref 0.0–0.1)
Basophils Relative: 0 % (ref 0–1)
Eosinophils Absolute: 0.1 10*3/uL (ref 0.0–0.7)
Eosinophils Relative: 1 % (ref 0–5)
HCT: 36.8 % (ref 36.0–46.0)
Hemoglobin: 12.8 g/dL (ref 12.0–15.0)
LYMPHS ABS: 2.6 10*3/uL (ref 0.7–4.0)
LYMPHS PCT: 39 % (ref 12–46)
MCH: 27.1 pg (ref 26.0–34.0)
MCHC: 34.8 g/dL (ref 30.0–36.0)
MCV: 78 fL (ref 78.0–100.0)
Monocytes Absolute: 0.5 10*3/uL (ref 0.1–1.0)
Monocytes Relative: 8 % (ref 3–12)
NEUTROS ABS: 3.4 10*3/uL (ref 1.7–7.7)
NEUTROS PCT: 52 % (ref 43–77)
PLATELETS: 305 10*3/uL (ref 150–400)
RBC: 4.72 MIL/uL (ref 3.87–5.11)
RDW: 13.2 % (ref 11.5–15.5)
WBC: 6.6 10*3/uL (ref 4.0–10.5)

## 2014-03-29 LAB — COMPLETE METABOLIC PANEL WITH GFR
ALT: 20 U/L (ref 0–35)
AST: 20 U/L (ref 0–37)
Albumin: 4.2 g/dL (ref 3.5–5.2)
Alkaline Phosphatase: 91 U/L (ref 39–117)
BUN: 14 mg/dL (ref 6–23)
CALCIUM: 9.3 mg/dL (ref 8.4–10.5)
CHLORIDE: 104 meq/L (ref 96–112)
CO2: 27 meq/L (ref 19–32)
CREATININE: 0.83 mg/dL (ref 0.50–1.10)
GFR, Est Non African American: 84 mL/min
Glucose, Bld: 95 mg/dL (ref 70–99)
Potassium: 4 mEq/L (ref 3.5–5.3)
SODIUM: 138 meq/L (ref 135–145)
TOTAL PROTEIN: 7.1 g/dL (ref 6.0–8.3)
Total Bilirubin: 0.4 mg/dL (ref 0.2–1.2)

## 2014-03-29 MED ORDER — AMOXICILLIN 875 MG PO TABS: 875.0000 mg | ORAL_TABLET | Freq: Two times a day (BID) | ORAL | Status: DC

## 2014-03-29 MED ORDER — GUAIFENESIN ER 1200 MG PO TB12: 1.0000 | ORAL_TABLET | Freq: Two times a day (BID) | ORAL | Status: AC

## 2014-03-29 NOTE — Patient Instructions (Signed)
Lubrication during sex to help with dryness.  If the hot flashes and dryness and irritability get to where you cannot handle it please recheck with me.  I will contact you with your lab results as soon as they are available.   If you have not heard from me in 2 weeks, please contact me.  The fastest way to get your results is to register for My Chart (see the instructions on the last page of this printout).

## 2014-03-29 NOTE — Progress Notes (Signed)
Subjective:    Patient ID: Kendra Rodriguez, female    DOB: April 05, 1966, 48 y.o.   MRN: [id number]  HPI Pt presents to clinic for a CPE.  She is doing well.  She has been doing PT for her leg pain - after she saw the ortho they felt that her numbness was related to musculoskeletal so she started PT.  She has had some left frontal headache and congestion for the last couple of weeks.  She has allergies but has been using her nasal spray and that has helped a lot.  Been years since vision and dental visits.  Mammogram this year - normal.  Review of Systems  Constitutional: Negative.   HENT: Negative.   Eyes: Negative.   Respiratory: Negative.   Cardiovascular: Negative.   Gastrointestinal: Negative.   Endocrine: Negative.   Genitourinary: Negative.   Musculoskeletal: Negative.   Skin: Negative.   Allergic/Immunologic: Negative.   Neurological: Negative.   Hematological: Negative.   Psychiatric/Behavioral: Negative.        Objective:   Physical Exam  Constitutional: She is oriented to person, place, and time. She appears well-developed and well-nourished.  HENT:  Head: Normocephalic and atraumatic.  Right Ear: Hearing, tympanic membrane, external ear and ear canal normal.  Left Ear: Hearing, tympanic membrane, external ear and ear canal normal.  Nose: Nose normal.  Mouth/Throat: Uvula is midline, oropharynx is clear and moist and mucous membranes are normal.  Eyes: Conjunctivae are normal.  Neck: Normal range of motion. Neck supple. No thyromegaly present.  Cardiovascular: Normal rate, regular rhythm and normal heart sounds.   No murmur heard. Pulmonary/Chest: Effort normal and breath sounds normal. She has no wheezes.  Abdominal: Soft. Bowel sounds are normal.  Genitourinary: Vagina normal. No breast swelling, tenderness, discharge or bleeding. Pelvic exam was performed with patient supine. There is no rash, tenderness, lesion or injury on the right labia. There is no rash,  tenderness, lesion or injury on the left labia. Right adnexum displays no mass, no tenderness and no fullness. Left adnexum displays no mass, no tenderness and no fullness.  Musculoskeletal: Normal range of motion.  Neurological: She is alert and oriented to person, place, and time.  Skin: Skin is warm and dry.  Psychiatric: She has a normal mood and affect. Her behavior is normal. Judgment and thought content normal.   Results for orders placed in visit on 03/29/14  POCT URINALYSIS DIPSTICK      Result Value Ref Range   Color, UA yellow     Clarity, UA clear     Glucose, UA neg     Bilirubin, UA neg     Ketones, UA neg     Spec Grav, UA 1.025     Blood, UA trace     pH, UA 6.0     Protein, UA neg     Urobilinogen, UA 0.2     Nitrite, UA neg     Leukocytes, UA Negative         Assessment & Plan:  Annual physical exam - Plan: CBC with Differential, COMPLETE METABOLIC PANEL WITH GFR, POCT urinalysis dipstick  Encounter for routine gynecological examination  Other malaise and fatigue - Plan: TSH  Screening for cholesterol level - Plan: Lipid panel  Screening for STD (sexually transmitted disease) - Plan: RPR, Hepatitis C antibody, Hepatitis B surface antigen, HIV antibody  Sinus infection - Plan: amoxicillin (AMOXIL) 875 MG tablet, Guaifenesin (MUCINEX MAXIMUM STRENGTH) 1200 MG TB12  Benny Lennert PA-C  [facility] 03/29/2014 8:56 PM

## 2014-03-30 LAB — HEPATITIS B SURFACE ANTIGEN: Hepatitis B Surface Ag: NEGATIVE

## 2014-03-30 LAB — TSH: TSH: 0.936 u[IU]/mL (ref 0.350–4.500)

## 2014-03-30 LAB — HEPATITIS C ANTIBODY: HCV Ab: NEGATIVE

## 2014-03-30 LAB — RPR

## 2014-03-30 LAB — HIV ANTIBODY (ROUTINE TESTING W REFLEX): HIV 1&2 Ab, 4th Generation: NONREACTIVE

## 2014-05-23 ENCOUNTER — Other Ambulatory Visit: Payer: Self-pay | Admitting: Orthopedic Surgery

## 2014-05-23 DIAGNOSIS — M5136 Other intervertebral disc degeneration, lumbar region: Secondary | ICD-10-CM

## 2014-05-23 DIAGNOSIS — M5126 Other intervertebral disc displacement, lumbar region: Secondary | ICD-10-CM

## 2014-06-02 ENCOUNTER — Ambulatory Visit
Admission: RE | Admit: 2014-06-02 | Discharge: 2014-06-02 | Disposition: A | Discharge status: Home / Self Care | Payer: BC Managed Care – PPO | Source: Ambulatory Visit | Attending: Orthopedic Surgery | Admitting: Orthopedic Surgery

## 2014-06-02 ENCOUNTER — Other Ambulatory Visit: Payer: Self-pay | Admitting: Orthopedic Surgery

## 2014-06-02 ENCOUNTER — Inpatient Hospital Stay
Admission: RE | Admit: 2014-06-02 | Discharge: 2014-06-02 | Disposition: A | Discharge status: Home / Self Care | Payer: Self-pay | Source: Ambulatory Visit | Attending: Orthopedic Surgery | Admitting: Orthopedic Surgery

## 2014-06-02 VITALS — BP 104/57 | HR 66

## 2014-06-02 DIAGNOSIS — M5126 Other intervertebral disc displacement, lumbar region: Secondary | ICD-10-CM

## 2014-06-02 DIAGNOSIS — M5136 Other intervertebral disc degeneration, lumbar region: Secondary | ICD-10-CM

## 2014-06-02 DIAGNOSIS — R52 Pain, unspecified: Secondary | ICD-10-CM

## 2014-06-02 MED ORDER — DIAZEPAM 5 MG PO TABS: 10.0000 mg | ORAL_TABLET | Freq: Once | ORAL | Status: DC

## 2014-06-02 MED ORDER — IOHEXOL 180 MG/ML  SOLN
18.0000 mL | Freq: Once | INTRAMUSCULAR | Status: AC | PRN
  Administered 2014-06-02: 18 mL via INTRATHECAL

## 2014-06-02 MED ORDER — DIAZEPAM 5 MG PO TABS
10.0000 mg | ORAL_TABLET | Freq: Once | ORAL | Status: AC
  Administered 2014-06-02: 10 mg via ORAL

## 2014-06-02 NOTE — Discharge Instructions (Signed)

## 2014-06-02 NOTE — Progress Notes (Signed)
Daughter read/interpreted discharge instructions to patient.  Questions answered.  jkl

## 2014-06-05 ENCOUNTER — Telehealth: Payer: Self-pay | Admitting: Radiology

## 2014-06-05 ENCOUNTER — Other Ambulatory Visit: Payer: Self-pay | Admitting: Orthopedic Surgery

## 2014-06-05 DIAGNOSIS — G971 Other reaction to spinal and lumbar puncture: Secondary | ICD-10-CM

## 2014-06-05 NOTE — Telephone Encounter (Signed)
Called daughter to tell her we had not received the blood patch order as of yet and that she might want to also call the office to expedite the order. Explained we could not do the blood patch without the order.

## 2014-06-05 NOTE — Telephone Encounter (Signed)
Pt's daughter called. Pt had a myelo on 06/02/14. She is now having a positional headache and would like to have a blood patch. Blood patch explained. Dr. Jeannetta EllisGioffre's office called. They will call back about the order.

## 2014-06-06 ENCOUNTER — Ambulatory Visit
Admission: RE | Admit: 2014-06-06 | Discharge: 2014-06-06 | Disposition: A | Discharge status: Home / Self Care | Payer: BC Managed Care – PPO | Source: Ambulatory Visit | Attending: Orthopedic Surgery | Admitting: Orthopedic Surgery

## 2014-06-06 VITALS — BP 114/69 | HR 63

## 2014-06-06 DIAGNOSIS — G971 Other reaction to spinal and lumbar puncture: Secondary | ICD-10-CM

## 2014-06-06 MED ORDER — IOHEXOL 180 MG/ML  SOLN
1.0000 mL | Freq: Once | INTRAMUSCULAR | Status: AC | PRN
  Administered 2014-06-06: 1 mL via EPIDURAL

## 2014-06-06 NOTE — Progress Notes (Signed)
20cc blood drawn from right [facility] space without difficulty for Epidural Blood Patch.  jkl

## 2014-06-06 NOTE — Discharge Instructions (Signed)

## 2014-06-09 NOTE — Progress Notes (Signed)
Patient's daughter called to ask if it is normal for her mom (who doesn't speak English) to still feel some pressure in the front of her head (no pain/headache anymore) and to have some dizziness after having an epidural blood patch here five days ago to treat the spinal headache she suffered after a lumbar myelogram 06/02/14.  I told her daughter that it may be that her mom still is a bit short on spinal fluid in her head and to try some more bed rest and caffeine consumption.  She said she would call her mom's doctor next week if the symptoms persist.  jkl

## 2014-07-17 ENCOUNTER — Other Ambulatory Visit: Payer: Self-pay

## 2014-07-17 DIAGNOSIS — Z1231 Encounter for screening mammogram for malignant neoplasm of breast: Secondary | ICD-10-CM

## 2014-07-24 ENCOUNTER — Encounter (INDEPENDENT_AMBULATORY_CARE_PROVIDER_SITE_OTHER): Payer: Self-pay

## 2014-07-24 ENCOUNTER — Ambulatory Visit
Admission: RE | Admit: 2014-07-24 | Discharge: 2014-07-24 | Disposition: A | Discharge status: Home / Self Care | Payer: BC Managed Care – PPO | Source: Ambulatory Visit

## 2014-07-24 DIAGNOSIS — Z1231 Encounter for screening mammogram for malignant neoplasm of breast: Secondary | ICD-10-CM

## 2014-08-14 ENCOUNTER — Ambulatory Visit (INDEPENDENT_AMBULATORY_CARE_PROVIDER_SITE_OTHER): Payer: BC Managed Care – PPO | Admitting: Family Medicine

## 2014-08-14 ENCOUNTER — Ambulatory Visit (INDEPENDENT_AMBULATORY_CARE_PROVIDER_SITE_OTHER): Payer: BC Managed Care – PPO

## 2014-08-14 VITALS — BP 121/82 | HR 69 | Temp 97.9°F | Resp 20 | Ht 62.5 in | Wt 203.2 lb

## 2014-08-14 DIAGNOSIS — R1031 Right lower quadrant pain: Secondary | ICD-10-CM

## 2014-08-14 DIAGNOSIS — R300 Dysuria: Secondary | ICD-10-CM

## 2014-08-14 DIAGNOSIS — H6591 Unspecified nonsuppurative otitis media, right ear: Secondary | ICD-10-CM

## 2014-08-14 DIAGNOSIS — R109 Unspecified abdominal pain: Secondary | ICD-10-CM

## 2014-08-14 LAB — POCT CBC
Granulocyte percent: 53.7 %G (ref 37–80)
HCT, POC: 39.1 % (ref 37.7–47.9)
Hemoglobin: 12.9 g/dL (ref 12.2–16.2)
Lymph, poc: 3.1 (ref 0.6–3.4)
MCH, POC: 26.6 pg — AB (ref 27–31.2)
MCHC: 32.9 g/dL (ref 31.8–35.4)
MCV: 80.8 fL (ref 80–97)
MID (cbc): 0.4 (ref 0–0.9)
MPV: 6.9 fL (ref 0–99.8)
POC Granulocyte: 4.1 (ref 2–6.9)
POC LYMPH PERCENT: 40.9 %L (ref 10–50)
POC MID %: 5.4 %M (ref 0–12)
Platelet Count, POC: 305 10*3/uL (ref 142–424)
RBC: 4.84 M/uL (ref 4.04–5.48)
RDW, POC: 13.4 %
WBC: 7.7 10*3/uL (ref 4.6–10.2)

## 2014-08-14 LAB — POCT URINALYSIS DIPSTICK
Bilirubin, UA: NEGATIVE
Blood, UA: NEGATIVE
Glucose, UA: NEGATIVE
Ketones, UA: NEGATIVE
Leukocytes, UA: NEGATIVE
Nitrite, UA: NEGATIVE
Protein, UA: NEGATIVE
Spec Grav, UA: 1.02
Urobilinogen, UA: 0.2
pH, UA: 6

## 2014-08-14 LAB — POCT UA - MICROSCOPIC ONLY
Casts, Ur, LPF, POC: NEGATIVE
Crystals, Ur, HPF, POC: NEGATIVE
Mucus, UA: NEGATIVE
Yeast, UA: NEGATIVE

## 2014-08-14 MED ORDER — AMOXICILLIN 875 MG PO TABS: 875.0000 mg | ORAL_TABLET | Freq: Two times a day (BID) | ORAL | Status: DC

## 2014-08-14 NOTE — Progress Notes (Signed)
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 She has 4 days of right upper quadrant pain which is worse after eating. The pain is intermittent and occurs at night sometimes. The pain radiates to her back and occasionally to her shoulder.  Patient describes pain as burning in quality.  Patient does not have any nausea vomiting or diarrhea. She's had this pain before.  Patient is status post hysterectomy and cholecystectomy.  She is no fever, jaundice, reflux currently. She has been treated for reflux in the past.  Objective: Alert and cooperative, in no acute distress. No icterus   HEENT: Patient has serous otitis media with scarring and clear bubbles in the left ear. Chest: Clear Heart: Regular no murmur Abdomen: Soft but tender in the right upper quadrant without HSM. There is no mass, rebound or guarding.  [facility] reading (PRIMARY) by  Dr. Milus GlazierLauenstein: KUB shows nonspecific gas pattern with slightly increased stool burden.  Results for orders placed in visit on 08/14/14  POCT UA - MICROSCOPIC ONLY      Result Value Ref Range   WBC, Ur, HPF, POC 1-3     RBC, urine, microscopic 0-1     Bacteria, U Microscopic trace     Mucus, UA neg     Epithelial cells, urine per micros 1-3     Crystals, Ur, HPF, POC neg     Casts, Ur, LPF, POC neg     Yeast, UA neg    POCT URINALYSIS DIPSTICK      Result Value Ref Range   Color, UA yellow     Clarity, UA clear     Glucose, UA neg     Bilirubin, UA neg     Ketones, UA neg     Spec Grav, UA 1.020     Blood, UA neg     pH, UA 6.0     Protein, UA neg     Urobilinogen, UA 0.2     Nitrite, UA neg     Leukocytes, UA Negative    POCT CBC      Result Value Ref Range   WBC 7.7  4.6 - 10.2 K/uL   Lymph, poc 3.1  0.6 - 3.4   POC LYMPH PERCENT 40.9  10 - 50 %L   MID (cbc) 0.4  0 - 0.9   POC MID % 5.4  0 - 12 %M   POC Granulocyte 4.1  2 - 6.9   Granulocyte percent 53.7  37 - 80 %G   RBC 4.84   4.04 - 5.48 M/uL   Hemoglobin 12.9  12.2 - 16.2 g/dL   HCT, POC 14.739.1  82.937.7 - 47.9 %   MCV 80.8  80 - 97 fL   MCH, POC 26.6 (*) 27 - 31.2 pg   MCHC 32.9  31.8 - 35.4 g/dL   RDW, POC 56.213.4     Platelet Count, POC 305  142 - 424 K/uL   MPV 6.9  0 - 99.8 fL   Right flank pain  Burning with urination - Plan: POCT UA - Microscopic Only, POCT urinalysis dipstick, POCT CBC, Comprehensive metabolic panel, DG Abd 1 View  RLQ abdominal pain - Plan: POCT UA - Microscopic Only, POCT urinalysis dipstick, POCT CBC, Comprehensive metabolic panel, DG Abd 1 View  Otitis media with effusion, right Patient instructed to use MiraLax for 5 days as well as take amoxicillin 875 twice a day for the next  week. Signed, Elvina SidleKurt Catilyn Boggus, MD

## 2014-08-14 NOTE — Patient Instructions (Signed)
You need to take MiraLax daily for 5 days.

## 2014-08-15 LAB — COMPREHENSIVE METABOLIC PANEL
ALT: 16 U/L (ref 0–35)
AST: 16 U/L (ref 0–37)
Albumin: 4.2 g/dL (ref 3.5–5.2)
Alkaline Phosphatase: 102 U/L (ref 39–117)
BUN: 15 mg/dL (ref 6–23)
CO2: 27 mEq/L (ref 19–32)
Calcium: 9.5 mg/dL (ref 8.4–10.5)
Chloride: 101 mEq/L (ref 96–112)
Creat: 0.59 mg/dL (ref 0.50–1.10)
Glucose, Bld: 102 mg/dL — ABNORMAL HIGH (ref 70–99)
Potassium: 3.8 mEq/L (ref 3.5–5.3)
Sodium: 136 mEq/L (ref 135–145)
Total Bilirubin: 0.4 mg/dL (ref 0.2–1.2)
Total Protein: 7.5 g/dL (ref 6.0–8.3)

## 2014-09-01 ENCOUNTER — Other Ambulatory Visit: Payer: Self-pay | Admitting: Surgical

## 2014-09-20 ENCOUNTER — Encounter (HOSPITAL_COMMUNITY): Admission: RE | Payer: Self-pay | Source: Ambulatory Visit

## 2014-09-20 ENCOUNTER — Ambulatory Visit (HOSPITAL_COMMUNITY)
Admission: RE | Admit: 2014-09-20 | Payer: BC Managed Care – PPO | Source: Ambulatory Visit | Admitting: Orthopedic Surgery

## 2014-09-20 SURGERY — LUMBAR LAMINECTOMY/DECOMPRESSION MICRODISCECTOMY 1 LEVEL
Anesthesia: General | Laterality: Left

## 2014-10-05 ENCOUNTER — Other Ambulatory Visit: Payer: Self-pay | Admitting: Surgical

## 2014-10-18 ENCOUNTER — Ambulatory Visit (HOSPITAL_COMMUNITY)
Admission: RE | Admit: 2014-10-18 | Discharge: 2014-10-18 | Disposition: A | Discharge status: Home / Self Care | Payer: BC Managed Care – PPO | Source: Ambulatory Visit | Attending: Surgical | Admitting: Surgical

## 2014-10-18 ENCOUNTER — Encounter (HOSPITAL_COMMUNITY): Payer: Self-pay

## 2014-10-18 ENCOUNTER — Encounter (HOSPITAL_COMMUNITY)
Admission: RE | Admit: 2014-10-18 | Discharge: 2014-10-18 | Disposition: A | Discharge status: Home / Self Care | Payer: BC Managed Care – PPO | Source: Ambulatory Visit | Attending: Orthopedic Surgery | Admitting: Orthopedic Surgery

## 2014-10-18 DIAGNOSIS — Z01812 Encounter for preprocedural laboratory examination: Secondary | ICD-10-CM | POA: Diagnosis not present

## 2014-10-18 DIAGNOSIS — Z01818 Encounter for other preprocedural examination: Secondary | ICD-10-CM

## 2014-10-18 DIAGNOSIS — M545 Low back pain: Secondary | ICD-10-CM | POA: Diagnosis not present

## 2014-10-18 DIAGNOSIS — M48061 Spinal stenosis, lumbar region without neurogenic claudication: Secondary | ICD-10-CM

## 2014-10-18 HISTORY — DX: Otalgia, unspecified ear: H92.09

## 2014-10-18 HISTORY — DX: Spinal stenosis, lumbar region without neurogenic claudication: M48.061

## 2014-10-18 HISTORY — DX: Gastro-esophageal reflux disease without esophagitis: K21.9

## 2014-10-18 LAB — CBC
HEMATOCRIT: 39.9 % (ref 36.0–46.0)
HEMOGLOBIN: 13.3 g/dL (ref 12.0–15.0)
MCH: 27.3 pg (ref 26.0–34.0)
MCHC: 33.3 g/dL (ref 30.0–36.0)
MCV: 81.9 fL (ref 78.0–100.0)
Platelets: 289 10*3/uL (ref 150–400)
RBC: 4.87 MIL/uL (ref 3.87–5.11)
RDW: 12.6 % (ref 11.5–15.5)
WBC: 5.4 10*3/uL (ref 4.0–10.5)

## 2014-10-18 LAB — COMPREHENSIVE METABOLIC PANEL
ALT: 19 U/L (ref 0–35)
AST: 17 U/L (ref 0–37)
Albumin: 4.1 g/dL (ref 3.5–5.2)
Alkaline Phosphatase: 118 U/L — ABNORMAL HIGH (ref 39–117)
Anion gap: 12 (ref 5–15)
BUN: 15 mg/dL (ref 6–23)
CO2: 26 mEq/L (ref 19–32)
Calcium: 9.9 mg/dL (ref 8.4–10.5)
Chloride: 100 mEq/L (ref 96–112)
Creatinine, Ser: 0.5 mg/dL (ref 0.50–1.10)
GFR calc Af Amer: >90 mL/min (ref >90)
GFR calc non Af Amer: >90 mL/min (ref >90)
Glucose, Bld: 110 mg/dL — ABNORMAL HIGH (ref 70–99)
Potassium: 4.4 mEq/L (ref 3.7–5.3)
Sodium: 138 mEq/L (ref 137–147)
Total Bilirubin: 0.4 mg/dL (ref 0.3–1.2)
Total Protein: 8.1 g/dL (ref 6.0–8.3)

## 2014-10-18 LAB — SURGICAL PCR SCREEN
MRSA, PCR: NEGATIVE
STAPHYLOCOCCUS AUREUS: POSITIVE — AB

## 2014-10-18 LAB — URINALYSIS, ROUTINE W REFLEX MICROSCOPIC
Bilirubin Urine: NEGATIVE
Glucose, UA: NEGATIVE mg/dL
Hgb urine dipstick: NEGATIVE
Ketones, ur: NEGATIVE mg/dL
Leukocytes, UA: NEGATIVE
Nitrite: NEGATIVE
Protein, ur: NEGATIVE mg/dL
Specific Gravity, Urine: 1.017 (ref 1.005–1.030)
Urobilinogen, UA: 0.2 mg/dL (ref 0.0–1.0)
pH: 7.5 (ref 5.0–8.0)

## 2014-10-18 LAB — PROTIME-INR
INR: 1.15 (ref 0.00–1.49)
Prothrombin Time: 14.8 seconds (ref 11.6–15.2)

## 2014-10-18 NOTE — Pre-Procedure Instructions (Signed)
EKG AND CXR WERE DONE TODAY PREOP. ANGELES THORNLOW WAS [place]  SPANISH INTERPRETER PRESENT TO INTERPRET FOR PT DURING PREOP VISIT.  SPANISH INTERPRETER HAS BEEN REQUESTED FOR DAY OF SURGERY.

## 2014-10-18 NOTE — Patient Instructions (Signed)
Kendra Rodriguez B Cumming  10/18/2014   Your procedure is scheduled on:   Tuesday  December 22nd  [facility]              Short Stay Center at 10:30 AM.  Call this number if you have problems the morning of surgery [phone]   Remember:  Do not eat food or drink liquids :After Midnight.     Take these medicines the morning of surgery with A SIP OF WATER:  DEXILANT                               You may not have any metal on your body including hair pins and              piercings  Do not wear jewelry, make-up, lotions, powders or perfumes.             Do not wear nail polish.  Do not shave  48 hours prior to surgery.              Men may shave face and neck.   Do not bring valuables to the hospital. [facility] IS NOT             RESPONSIBLE   FOR VALUABLES.  Contacts, dentures or bridgework may not be worn into surgery.  Leave suitcase in the car. After surgery it may be brought to your room.     Patients discharged the day of surgery will not be allowed to drive home.  Name and phone number of your driver:  PATIENT IS TO STAY IN HOSPITAL ONE NIGHT  Special Instructions: N/A              Please read over the following fact sheets you were given: _____________________________________________________________________             [facility] - Preparing for Surgery Before surgery, you can play an important role.  Because skin is not sterile, your skin needs to be as free of germs as possible.  You can reduce the number of germs on your skin by washing with CHG (chlorahexidine gluconate) soap before surgery.  CHG is an antiseptic cleaner which kills germs and bonds with the skin to continue killing germs even after washing. Please DO NOT use if you have an allergy to CHG or antibacterial soaps.  If your skin becomes reddened/irritated stop using the CHG and inform your nurse when you arrive at Short Stay. Do not shave (including legs and underarms) for at  least 48 hours prior to the first CHG shower.  You may shave your face/neck. Please follow these instructions carefully:  1.  Shower with CHG Soap the night before surgery and the  morning of Surgery.  2.  If you choose to wash your hair, wash your hair first as usual with your  normal  shampoo.  3.  After you shampoo, rinse your hair and body thoroughly to remove the  shampoo.                           4.  Use CHG as you would any other liquid soap.  You can apply chg directly  to the skin and wash  Gently with a scrungie or clean washcloth.  5.  Apply the CHG Soap to your body ONLY FROM THE NECK DOWN.   Do not use on face/ open                           Wound or open sores. Avoid contact with eyes, ears mouth and genitals (private parts).                       Wash face,  Genitals (private parts) with your normal soap.             6.  Wash thoroughly, paying special attention to the area where your surgery  will be performed.  7.  Thoroughly rinse your body with warm water from the neck down.  8.  DO NOT shower/wash with your normal soap after using and rinsing off  the CHG Soap.                9.  Pat yourself dry with a clean towel.            10.  Wear clean pajamas.            11.  Place clean sheets on your bed the night of your first shower and do not  sleep with pets. Day of Surgery : Do not apply any lotions/deodorants the morning of surgery.  Please wear clean clothes to the hospital/surgery center.  FAILURE TO FOLLOW THESE INSTRUCTIONS MAY RESULT IN THE CANCELLATION OF YOUR SURGERY PATIENT SIGNATURE_________________________________  NURSE SIGNATURE__________________________________  ________________________________________________________________________   Adam Phenix  An incentive spirometer is a tool that can help keep your lungs clear and active. This tool measures how well you are filling your lungs with each breath. Taking long deep breaths  may help reverse or decrease the chance of developing breathing (pulmonary) problems (especially infection) following:  A long period of time when you are unable to move or be active. BEFORE THE PROCEDURE   If the spirometer includes an indicator to show your best effort, your nurse or respiratory therapist will set it to a desired goal.  If possible, sit up straight or lean slightly forward. Try not to slouch.  Hold the incentive spirometer in an upright position. INSTRUCTIONS FOR USE  1. Sit on the edge of your bed if possible, or sit up as far as you can in bed or on a chair. 2. Hold the incentive spirometer in an upright position. 3. Breathe out normally. 4. Place the mouthpiece in your mouth and seal your lips tightly around it. 5. Breathe in slowly and as deeply as possible, raising the piston or the ball toward the top of the column. 6. Hold your breath for 3-5 seconds or for as long as possible. Allow the piston or ball to fall to the bottom of the column. 7. Remove the mouthpiece from your mouth and breathe out normally. 8. Rest for a few seconds and repeat Steps 1 through 7 at least 10 times every 1-2 hours when you are awake. Take your time and take a few normal breaths between deep breaths. 9. The spirometer may include an indicator to show your best effort. Use the indicator as a goal to work toward during each repetition. 10. After each set of 10 deep breaths, practice coughing to be sure your lungs are clear. If you have an incision (the cut made at the time of surgery),  support your incision when coughing by placing a pillow or rolled up towels firmly against it. Once you are able to get out of bed, walk around indoors and cough well. You may stop using the incentive spirometer when instructed by your caregiver.  RISKS AND COMPLICATIONS  Take your time so you do not get dizzy or light-headed.  If you are in pain, you may need to take or ask for pain medication before doing  incentive spirometry. It is harder to take a deep breath if you are having pain. AFTER USE  Rest and breathe slowly and easily.  It can be helpful to keep track of a log of your progress. Your caregiver can provide you with a simple table to help with this. If you are using the spirometer at home, follow these instructions: [place] IF:   You are having difficultly using the spirometer.  You have trouble using the spirometer as often as instructed.  Your pain medication is not giving enough relief while using the spirometer.  You develop fever of 100.5 F (38.1 C) or higher. SEEK IMMEDIATE MEDICAL CARE IF:   You cough up bloody sputum that had not been present before.  You develop fever of 102 F (38.9 C) or greater.  You develop worsening pain at or near the incision site. MAKE SURE YOU:   Understand these instructions.  Will watch your condition.  Will get help right away if you are not doing well or get worse. Document Released: 03/02/2007 Document Revised: 01/12/2012 Document Reviewed: 05/03/2007 [facility] Patient Information 2014 [place], [place].   ________________________________________________________________________

## 2014-10-20 NOTE — Progress Notes (Signed)
Final EKG done 10/18/14 in EPIC.   

## 2014-10-24 ENCOUNTER — Ambulatory Visit (HOSPITAL_COMMUNITY): Payer: BC Managed Care – PPO

## 2014-10-24 ENCOUNTER — Encounter (HOSPITAL_COMMUNITY): Payer: Self-pay

## 2014-10-24 ENCOUNTER — Inpatient Hospital Stay (HOSPITAL_COMMUNITY): Payer: BC Managed Care – PPO

## 2014-10-24 ENCOUNTER — Ambulatory Visit (HOSPITAL_COMMUNITY): Payer: BC Managed Care – PPO | Admitting: Anesthesiology

## 2014-10-24 ENCOUNTER — Inpatient Hospital Stay (HOSPITAL_COMMUNITY)
Admission: RE | Admit: 2014-10-24 | Discharge: 2014-10-25 | DRG: 520 | Disposition: A | Discharge status: Home / Self Care | Payer: BC Managed Care – PPO | Source: Ambulatory Visit | Attending: Orthopedic Surgery | Admitting: Orthopedic Surgery

## 2014-10-24 ENCOUNTER — Encounter (HOSPITAL_COMMUNITY)
Admission: RE | Disposition: A | Discharge status: Home / Self Care | Payer: Self-pay | Source: Ambulatory Visit | Attending: Orthopedic Surgery

## 2014-10-24 DIAGNOSIS — Z9049 Acquired absence of other specified parts of digestive tract: Secondary | ICD-10-CM | POA: Diagnosis present

## 2014-10-24 DIAGNOSIS — M48062 Spinal stenosis, lumbar region with neurogenic claudication: Secondary | ICD-10-CM | POA: Diagnosis present

## 2014-10-24 DIAGNOSIS — R531 Weakness: Secondary | ICD-10-CM | POA: Diagnosis present

## 2014-10-24 DIAGNOSIS — I739 Peripheral vascular disease, unspecified: Secondary | ICD-10-CM | POA: Diagnosis present

## 2014-10-24 DIAGNOSIS — E079 Disorder of thyroid, unspecified: Secondary | ICD-10-CM | POA: Diagnosis present

## 2014-10-24 DIAGNOSIS — M21372 Foot drop, left foot: Secondary | ICD-10-CM | POA: Diagnosis present

## 2014-10-24 DIAGNOSIS — F419 Anxiety disorder, unspecified: Secondary | ICD-10-CM | POA: Diagnosis present

## 2014-10-24 DIAGNOSIS — G43909 Migraine, unspecified, not intractable, without status migrainosus: Secondary | ICD-10-CM | POA: Diagnosis present

## 2014-10-24 DIAGNOSIS — Z79899 Other long term (current) drug therapy: Secondary | ICD-10-CM | POA: Diagnosis not present

## 2014-10-24 DIAGNOSIS — J309 Allergic rhinitis, unspecified: Secondary | ICD-10-CM | POA: Diagnosis present

## 2014-10-24 DIAGNOSIS — Z419 Encounter for procedure for purposes other than remedying health state, unspecified: Secondary | ICD-10-CM

## 2014-10-24 DIAGNOSIS — F329 Major depressive disorder, single episode, unspecified: Secondary | ICD-10-CM | POA: Diagnosis present

## 2014-10-24 DIAGNOSIS — M4806 Spinal stenosis, lumbar region: Secondary | ICD-10-CM | POA: Diagnosis present

## 2014-10-24 DIAGNOSIS — Z90710 Acquired absence of both cervix and uterus: Secondary | ICD-10-CM | POA: Diagnosis not present

## 2014-10-24 DIAGNOSIS — M5126 Other intervertebral disc displacement, lumbar region: Secondary | ICD-10-CM | POA: Diagnosis present

## 2014-10-24 DIAGNOSIS — K219 Gastro-esophageal reflux disease without esophagitis: Secondary | ICD-10-CM | POA: Diagnosis present

## 2014-10-24 DIAGNOSIS — M25512 Pain in left shoulder: Secondary | ICD-10-CM

## 2014-10-24 HISTORY — PX: LUMBAR LAMINECTOMY/DECOMPRESSION MICRODISCECTOMY: SHX5026

## 2014-10-24 SURGERY — LUMBAR LAMINECTOMY/DECOMPRESSION MICRODISCECTOMY 1 LEVEL
Anesthesia: General | Site: Back | Laterality: Left

## 2014-10-24 MED ORDER — BUPIVACAINE-EPINEPHRINE (PF) 0.5% -1:200000 IJ SOLN
INTRAMUSCULAR | Status: AC
  Filled 2014-10-24: qty 30

## 2014-10-24 MED ORDER — PROMETHAZINE HCL 25 MG/ML IJ SOLN: 6.2500 mg | INTRAMUSCULAR | Status: DC | PRN

## 2014-10-24 MED ORDER — EPHEDRINE SULFATE 50 MG/ML IJ SOLN
INTRAMUSCULAR | Status: DC | PRN
  Administered 2014-10-24: 10 mg via INTRAVENOUS

## 2014-10-24 MED ORDER — GLYCOPYRROLATE 0.2 MG/ML IJ SOLN
INTRAMUSCULAR | Status: DC | PRN
  Administered 2014-10-24: .3 mg via INTRAVENOUS

## 2014-10-24 MED ORDER — CEFAZOLIN SODIUM 1-5 GM-% IV SOLN
1.0000 g | Freq: Three times a day (TID) | INTRAVENOUS | Status: AC
  Administered 2014-10-24 – 2014-10-25 (×3): 1 g via INTRAVENOUS
  Filled 2014-10-24 (×3): qty 50

## 2014-10-24 MED ORDER — BACITRACIN-NEOMYCIN-POLYMYXIN 400-5-5000 EX OINT
TOPICAL_OINTMENT | CUTANEOUS | Status: DC | PRN
  Administered 2014-10-24: 1 via TOPICAL

## 2014-10-24 MED ORDER — FENTANYL CITRATE 0.05 MG/ML IJ SOLN
INTRAMUSCULAR | Status: AC
Start: 2014-10-24 — End: 2014-10-24
  Filled 2014-10-24: qty 5

## 2014-10-24 MED ORDER — BUPIVACAINE LIPOSOME 1.3 % IJ SUSP
20.0000 mL | Freq: Once | INTRAMUSCULAR | Status: AC
  Administered 2014-10-24: 20 mL
  Filled 2014-10-24: qty 20

## 2014-10-24 MED ORDER — HYDROMORPHONE HCL 1 MG/ML IJ SOLN: 0.5000 mg | INTRAMUSCULAR | Status: DC | PRN

## 2014-10-24 MED ORDER — ROCURONIUM BROMIDE 100 MG/10ML IV SOLN
INTRAVENOUS | Status: AC
  Filled 2014-10-24: qty 1

## 2014-10-24 MED ORDER — PHENOL 1.4 % MT LIQD: 1.0000 | OROMUCOSAL | Status: DC | PRN

## 2014-10-24 MED ORDER — OXYCODONE-ACETAMINOPHEN 5-325 MG PO TABS
1.0000 | ORAL_TABLET | ORAL | Status: DC | PRN
  Administered 2014-10-25: 2 via ORAL
  Filled 2014-10-24: qty 2

## 2014-10-24 MED ORDER — FENTANYL CITRATE 0.05 MG/ML IJ SOLN
INTRAMUSCULAR | Status: DC | PRN
  Administered 2014-10-24: 100 ug via INTRAVENOUS
  Administered 2014-10-24 (×3): 50 ug via INTRAVENOUS

## 2014-10-24 MED ORDER — BISACODYL 5 MG PO TBEC: 5.0000 mg | DELAYED_RELEASE_TABLET | Freq: Every day | ORAL | Status: DC | PRN

## 2014-10-24 MED ORDER — DEXAMETHASONE SODIUM PHOSPHATE 10 MG/ML IJ SOLN
INTRAMUSCULAR | Status: DC | PRN
  Administered 2014-10-24: 10 mg via INTRAVENOUS

## 2014-10-24 MED ORDER — DEXAMETHASONE SODIUM PHOSPHATE 10 MG/ML IJ SOLN
INTRAMUSCULAR | Status: AC
  Filled 2014-10-24: qty 1

## 2014-10-24 MED ORDER — POLYETHYLENE GLYCOL 3350 17 G PO PACK: 17.0000 g | PACK | Freq: Every day | ORAL | Status: DC | PRN

## 2014-10-24 MED ORDER — MENTHOL 3 MG MT LOZG
1.0000 | LOZENGE | OROMUCOSAL | Status: DC | PRN
  Filled 2014-10-24: qty 9

## 2014-10-24 MED ORDER — ASPIRIN EC 81 MG PO TBEC
81.0000 mg | DELAYED_RELEASE_TABLET | Freq: Once | ORAL | Status: DC
  Filled 2014-10-24: qty 1

## 2014-10-24 MED ORDER — MIDAZOLAM HCL 2 MG/2ML IJ SOLN
INTRAMUSCULAR | Status: AC
  Filled 2014-10-24: qty 2

## 2014-10-24 MED ORDER — HYDROMORPHONE HCL 1 MG/ML IJ SOLN
0.2500 mg | INTRAMUSCULAR | Status: DC | PRN
  Administered 2014-10-24: 0.5 mg via INTRAVENOUS

## 2014-10-24 MED ORDER — BACITRACIN ZINC 500 UNIT/GM EX OINT
TOPICAL_OINTMENT | CUTANEOUS | Status: AC
  Filled 2014-10-24: qty 28.35

## 2014-10-24 MED ORDER — THROMBIN 5000 UNITS EX SOLR
CUTANEOUS | Status: AC
  Filled 2014-10-24: qty 5000

## 2014-10-24 MED ORDER — PROPOFOL 10 MG/ML IV BOLUS
INTRAVENOUS | Status: AC
  Filled 2014-10-24: qty 20

## 2014-10-24 MED ORDER — ROCURONIUM BROMIDE 100 MG/10ML IV SOLN
INTRAVENOUS | Status: DC | PRN
  Administered 2014-10-24: 10 mg via INTRAVENOUS
  Administered 2014-10-24 (×3): 5 mg via INTRAVENOUS
  Administered 2014-10-24: 40 mg via INTRAVENOUS
  Administered 2014-10-24: 5 mg via INTRAVENOUS

## 2014-10-24 MED ORDER — CEFAZOLIN SODIUM-DEXTROSE 2-3 GM-% IV SOLR
INTRAVENOUS | Status: AC
  Filled 2014-10-24: qty 50

## 2014-10-24 MED ORDER — MIDAZOLAM HCL 5 MG/5ML IJ SOLN
INTRAMUSCULAR | Status: DC | PRN
  Administered 2014-10-24: 2 mg via INTRAVENOUS

## 2014-10-24 MED ORDER — ACETAMINOPHEN 650 MG RE SUPP: 650.0000 mg | RECTAL | Status: DC | PRN

## 2014-10-24 MED ORDER — ONDANSETRON HCL 4 MG/2ML IJ SOLN
INTRAMUSCULAR | Status: DC | PRN
  Administered 2014-10-24: 4 mg via INTRAVENOUS

## 2014-10-24 MED ORDER — ONDANSETRON HCL 4 MG/2ML IJ SOLN: 4.0000 mg | INTRAMUSCULAR | Status: DC | PRN

## 2014-10-24 MED ORDER — ASPIRIN 81 MG PO CHEW
81.0000 mg | CHEWABLE_TABLET | Freq: Once | ORAL | Status: AC
  Administered 2014-10-24: 81 mg via ORAL
  Filled 2014-10-24: qty 1

## 2014-10-24 MED ORDER — INFLUENZA VAC SPLIT QUAD 0.5 ML IM SUSY
0.5000 mL | PREFILLED_SYRINGE | INTRAMUSCULAR | Status: AC
  Administered 2014-10-25: 0.5 mL via INTRAMUSCULAR
  Filled 2014-10-24 (×2): qty 0.5

## 2014-10-24 MED ORDER — SODIUM CHLORIDE 0.9 % IR SOLN
Status: AC
  Filled 2014-10-24: qty 1

## 2014-10-24 MED ORDER — NEOSTIGMINE METHYLSULFATE 10 MG/10ML IV SOLN
INTRAVENOUS | Status: DC | PRN
  Administered 2014-10-24: 4 mg via INTRAVENOUS

## 2014-10-24 MED ORDER — LACTATED RINGERS IV SOLN
INTRAVENOUS | Status: DC | PRN
  Administered 2014-10-24: 12:00:00 via INTRAVENOUS

## 2014-10-24 MED ORDER — FLEET ENEMA 7-19 GM/118ML RE ENEM: 1.0000 | ENEMA | Freq: Once | RECTAL | Status: AC | PRN

## 2014-10-24 MED ORDER — METHOCARBAMOL 500 MG PO TABS
500.0000 mg | ORAL_TABLET | Freq: Four times a day (QID) | ORAL | Status: DC | PRN
  Administered 2014-10-25 (×2): 500 mg via ORAL
  Filled 2014-10-24 (×3): qty 1

## 2014-10-24 MED ORDER — FAMOTIDINE IN NACL 20-0.9 MG/50ML-% IV SOLN
20.0000 mg | INTRAVENOUS | Status: AC
  Administered 2014-10-24: 20 mg via INTRAVENOUS
  Filled 2014-10-24 (×2): qty 50

## 2014-10-24 MED ORDER — PANTOPRAZOLE SODIUM 40 MG PO TBEC
40.0000 mg | DELAYED_RELEASE_TABLET | Freq: Every day | ORAL | Status: DC
  Administered 2014-10-25: 40 mg via ORAL
  Filled 2014-10-24: qty 1

## 2014-10-24 MED ORDER — PROPOFOL 10 MG/ML IV BOLUS
INTRAVENOUS | Status: DC | PRN
  Administered 2014-10-24 (×2): 50 mg via INTRAVENOUS
  Administered 2014-10-24: 150 mg via INTRAVENOUS

## 2014-10-24 MED ORDER — HYDROMORPHONE HCL 1 MG/ML IJ SOLN
INTRAMUSCULAR | Status: AC
  Administered 2014-10-24: 0.5 mg
  Filled 2014-10-24: qty 1

## 2014-10-24 MED ORDER — MEPERIDINE HCL 50 MG/ML IJ SOLN: 6.2500 mg | INTRAMUSCULAR | Status: DC | PRN

## 2014-10-24 MED ORDER — CEFAZOLIN SODIUM-DEXTROSE 2-3 GM-% IV SOLR
2.0000 g | INTRAVENOUS | Status: AC
  Administered 2014-10-24: 2 g via INTRAVENOUS

## 2014-10-24 MED ORDER — HYDROCODONE-ACETAMINOPHEN 5-325 MG PO TABS
1.0000 | ORAL_TABLET | ORAL | Status: DC | PRN
  Administered 2014-10-24 – 2014-10-25 (×4): 2 via ORAL
  Filled 2014-10-24 (×4): qty 2

## 2014-10-24 MED ORDER — FENTANYL CITRATE 0.05 MG/ML IJ SOLN
INTRAMUSCULAR | Status: AC
  Filled 2014-10-24: qty 2

## 2014-10-24 MED ORDER — LIDOCAINE HCL (CARDIAC) 20 MG/ML IV SOLN
INTRAVENOUS | Status: DC | PRN
  Administered 2014-10-24: 50 mg via INTRAVENOUS

## 2014-10-24 MED ORDER — LIDOCAINE HCL (CARDIAC) 20 MG/ML IV SOLN
INTRAVENOUS | Status: AC
  Filled 2014-10-24: qty 5

## 2014-10-24 MED ORDER — SODIUM CHLORIDE 0.9 % IR SOLN
Status: DC | PRN
  Administered 2014-10-24: 500 mL

## 2014-10-24 MED ORDER — LACTATED RINGERS IV SOLN: INTRAVENOUS | Status: DC

## 2014-10-24 MED ORDER — METHOCARBAMOL 1000 MG/10ML IJ SOLN
500.0000 mg | Freq: Four times a day (QID) | INTRAVENOUS | Status: DC | PRN
  Filled 2014-10-24: qty 5

## 2014-10-24 MED ORDER — GELATIN ABSORBABLE MT POWD
OROMUCOSAL | Status: DC | PRN
  Administered 2014-10-24: 13:00:00 via TOPICAL

## 2014-10-24 MED ORDER — LACTATED RINGERS IV SOLN
INTRAVENOUS | Status: DC
  Administered 2014-10-24: 17:00:00 via INTRAVENOUS

## 2014-10-24 MED ORDER — ACETAMINOPHEN 325 MG PO TABS: 650.0000 mg | ORAL_TABLET | ORAL | Status: DC | PRN

## 2014-10-24 MED ORDER — LACTATED RINGERS IV SOLN
INTRAVENOUS | Status: DC
  Administered 2014-10-24: 1000 mL via INTRAVENOUS

## 2014-10-24 MED ORDER — NITROGLYCERIN 0.4 MG SL SUBL
SUBLINGUAL_TABLET | SUBLINGUAL | Status: AC
  Administered 2014-10-24: 0.4 mg
  Filled 2014-10-24: qty 1

## 2014-10-24 MED ORDER — LORAZEPAM 2 MG/ML IJ SOLN
0.5000 mg | INTRAMUSCULAR | Status: AC
  Administered 2014-10-24: 0.5 mg via INTRAVENOUS
  Filled 2014-10-24: qty 1

## 2014-10-24 MED ORDER — ONDANSETRON HCL 4 MG/2ML IJ SOLN
INTRAMUSCULAR | Status: AC
  Filled 2014-10-24: qty 2

## 2014-10-24 MED ORDER — BUPIVACAINE-EPINEPHRINE 0.5% -1:200000 IJ SOLN
INTRAMUSCULAR | Status: DC | PRN
  Administered 2014-10-24: 20 mL

## 2014-10-24 MED ORDER — NITROGLYCERIN 0.4 MG SL SUBL: 0.4000 mg | SUBLINGUAL_TABLET | SUBLINGUAL | Status: DC | PRN

## 2014-10-24 SURGICAL SUPPLY — 36 items
BAG ZIPLOCK 12X15 (MISCELLANEOUS) ×2 IMPLANT
CLEANER TIP ELECTROSURG 2X2 (MISCELLANEOUS) ×2 IMPLANT
DRAPE LG THREE QUARTER DISP (DRAPES) ×4 IMPLANT
DRAPE MICROSCOPE LEICA (MISCELLANEOUS) ×2 IMPLANT
DRAPE POUCH INSTRU U-SHP 10X18 (DRAPES) ×2 IMPLANT
DRAPE SURG 17X11 SM STRL (DRAPES) ×2 IMPLANT
DRSG ADAPTIC 3X8 NADH LF (GAUZE/BANDAGES/DRESSINGS) ×2 IMPLANT
DRSG PAD ABDOMINAL 8X10 ST (GAUZE/BANDAGES/DRESSINGS) ×6 IMPLANT
DURAPREP 26ML APPLICATOR (WOUND CARE) ×2 IMPLANT
ELECT BLADE TIP CTD 4 INCH (ELECTRODE) ×2 IMPLANT
ELECT REM PT RETURN 9FT ADLT (ELECTROSURGICAL) ×2
ELECTRODE REM PT RTRN 9FT ADLT (ELECTROSURGICAL) ×1 IMPLANT
GAUZE SPONGE 4X4 12PLY STRL (GAUZE/BANDAGES/DRESSINGS) ×2 IMPLANT
GLOVE BIOGEL PI IND STRL 8 (GLOVE) ×2 IMPLANT
GLOVE BIOGEL PI INDICATOR 8 (GLOVE) ×2
GLOVE ECLIPSE 8.0 STRL XLNG CF (GLOVE) ×4 IMPLANT
GOWN STRL REUS W/TWL XL LVL3 (GOWN DISPOSABLE) ×4 IMPLANT
KIT BASIN OR (CUSTOM PROCEDURE TRAY) ×2 IMPLANT
KIT POSITIONING SURG ANDREWS (MISCELLANEOUS) ×2 IMPLANT
LABEL STERILE EO BLANK 1X3 WHT (LABEL) ×2 IMPLANT
MANIFOLD NEPTUNE II (INSTRUMENTS) ×2 IMPLANT
NEEDLE SPNL 18GX3.5 QUINCKE PK (NEEDLE) ×6 IMPLANT
PACK LAMINECTOMY ORTHO (CUSTOM PROCEDURE TRAY) ×2 IMPLANT
PATTIES SURGICAL .5 X.5 (GAUZE/BANDAGES/DRESSINGS) IMPLANT
PATTIES SURGICAL .75X.75 (GAUZE/BANDAGES/DRESSINGS) ×2 IMPLANT
PATTIES SURGICAL 1X1 (DISPOSABLE) ×2 IMPLANT
SLEEVE SURGEON STRL (DRAPES) ×2 IMPLANT
SPONGE LAP 4X18 X RAY DECT (DISPOSABLE) ×4 IMPLANT
SPONGE SURGIFOAM ABS GEL 100 (HEMOSTASIS) ×2 IMPLANT
STAPLER VISISTAT 35W (STAPLE) ×2 IMPLANT
SUT VIC AB 0 CT1 27 (SUTURE)
SUT VIC AB 0 CT1 27XBRD ANTBC (SUTURE) IMPLANT
SUT VIC AB 1 CT1 27 (SUTURE) ×3
SUT VIC AB 1 CT1 27XBRD ANTBC (SUTURE) ×3 IMPLANT
SYR 20CC LL (SYRINGE) ×2 IMPLANT
TOWEL OR 17X26 10 PK STRL BLUE (TOWEL DISPOSABLE) ×2 IMPLANT

## 2014-10-24 NOTE — Transfer of Care (Signed)
Immediate Anesthesia Transfer of Care Note  Patient: Kendra Rodriguez  Procedure(s) Performed: Procedure(s): COMPLETE LUMBAR DECOMPRESSION L4-L5 CENTRAL AND MICRODISCECTOMY L4-L5 LEFT (Left)  Patient Location: PACU  Anesthesia Type:General  Level of Consciousness: awake, alert , oriented and patient cooperative  Airway & Oxygen Therapy: Patient Spontanous Breathing and Patient connected to face mask oxygen  Post-op Assessment: Report given to PACU RN, Post -op Vital signs reviewed and stable and Patient moving all extremities  Post vital signs: Reviewed and stable  Complications: No apparent anesthesia complications

## 2014-10-24 NOTE — Op Note (Signed)
Kendra Rodriguez:  Heick, Marc                 ACCOUNT NO.:  [id number]  MEDICAL RECORD NO.:  [id number]  LOCATION:  1603                         [facility]  PHYSICIAN:  Georges Lynchonald A. Veldon Wager, M.D.DATE OF BIRTH:  November 05, 1965  DATE OF PROCEDURE:  10/24/2014 DATE OF DISCHARGE:                              OPERATIVE REPORT   SURGEON:  Georges Lynchonald A. Darrelyn HillockGioffre, M.D.  ASSISTANT:  Marlowe KaysJames Aplington, M.D.  PREOPERATIVE DIAGNOSES: 1. Spinal stenosis at L4-5 with large defect on the left at this     level. 2. Footdrop on the left. 3. Herniated lumbar disk at L4-5 on the left.  POSTOPERATIVE DIAGNOSES: 1. Spinal stenosis at L4-5 with large defect on the left at this     level. 2. Footdrop on the left. 3. Herniated lumbar disk at L4-5 on the left.  Note, all her pain was     on the left leg.  DESCRIPTION OF PROCEDURE:  Under general anesthesia, routine orthopedic prepping and draping, the lower back was carried out.  At this particular time, the appropriate time-out was first carried out.  I also marked the appropriate left side of the back because of the pain that was not that side.  The foot drop was on that side, but she did have a central approach.  Under general anesthesia after the routine orthopedic prepping and draping, and the patient on spinal frame.  She had 2 g of IV Ancef.  At this time, two needles were placed in the back after the time-out.  X-ray was taken for localization purposes.  Following that, an incision was made over the L4-5 space, bleeders were identified and cauterized.  The incision was extended proximally and distally.  Self- retaining retractors were inserted.  I then stripped the muscle from the lamina and spinous process bilaterally.  Kocher clamps then were placed on the spinous processes.  X-ray was taken to verify the position. Following that, I then went down to began my central decompressive lumbar laminectomy, removing the spinous process of L4, part of L5 and  a part of L3 in order to get down to do a good decompression.  The microscope was brought in.  We then went down and identified the L4-5 space.  Great care was taken not to injure the dura.  We stayed well above the ligamentum flavum and carried out our laminectomies.  We went proximally and distally until we were totally free with the hockey- stick.  We then went out and decompressed the lateral recesses in usual fashion.  The left lateral recess was quite tight as expected with the myelogram findings and the clinical findings of a partial foot drop.  We protected the dura at all times.  After we did this decompression, we then cauterized the lateral recess veins on the left.  I gently inserted the Penfield 4, identified the nerve root and then gently retracted that with the D'Errico retractors.  We then put a needle in the space. Another x-ray was taken.  Note, another x-ray was taken prior to that. The x-rays were labeled.  I then made a cruciate incision in the posterior longitudinal ligament and a complete diskectomy was carried out.  The pituitary rongeur was inserted partially into the disk space and x-ray was taken.  No needle x-ray was taken, this was a pituitary in this space.  Following that, I utilized the Epstein curettes and a nerve hook to make sure there were no other loose fragments, there were none. The nerve root was completely decompressed.  I was able to easily pass a hockey-stick down the foramen as well as out the foramen above after we did our foraminotomy.  The wound was thoroughly irrigated and good hemostasis was maintained.  I loosely applied some thrombin-soaked Gelfoam, closed the wound layers in usual fashion leaving a small distal deep and proximal part of the wound open for drainage purposes.  Subcu was closed with #1 Vicryl, skin with metal staples.  Note, I injected 20 mL of Exparel into the muscle and soft tissue at the end of the procedure.  At the  beginning of the procedure, I injected 20 mL of 0.5% Marcaine and epinephrine into the soft tissue to control bleeding. Sterile Neosporin dressings were applied.  The patient left the operative room in satisfactory condition.          ______________________________ Georges Lynchonald A. Darrelyn HillockGioffre, M.D.     RAG/MEDQ  D:  10/24/2014  T:  10/24/2014  Job:  [id number]

## 2014-10-24 NOTE — Brief Op Note (Signed)
10/24/2014  3:06 PM  PATIENT:  Kendra Rodriguez  48 y.o. female  PRE-OPERATIVE DIAGNOSIS:  SPINAL STENOSIS L-4-L-5 and Herniated Lumbar Disc at L-4-L-5 on the left and Foot Drop on the Left.  POST-OPERATIVE DIAGNOSIS: Same as Pre-Op  PROCEDURE:  Procedure(s): COMPLETE LUMBAR DECOMPRESSION L4-L5 CENTRAL AND MICRODISCECTOMY L4-L5 LEFT (Left) and Foraminotomies for L-4 and L-5 Nerve Roots Bilaterally  SURGEON:  Surgeon(s) and Role:    * Jacki Conesonald A Chaundra Abreu, MD - Primary    * Drucilla SchmidtJames P Aplington, MD - Assisting     ASSISTANTS: Marlowe KaysJames Aplington MD  ANESTHESIA:   general  EBL:  Total I/O In: -  Out: 50 [Blood:50]  BLOOD ADMINISTERED:none  DRAINS: none   LOCAL MEDICATIONS USED:  MARCAINE20cc of 0.50% with Epinephrine.at the start of the case and 20cc of Exparel at the end of the Case.   SPECIMEN:  Source of Specimen:  L-4-L-5 interspace.  DISPOSITION OF SPECIMEN:  PATHOLOGY  COUNTS:  YES  TOURNIQUET:  * No tourniquets in log *  DICTATION: .Other Dictation: Dictation Number [id number]  PLAN OF CARE: Admit for overnight observation  PATIENT DISPOSITION:  PACU - hemodynamically stable.   Delay start of Pharmacological VTE agent (>24hrs) due to surgical blood loss or risk of bleeding: yes

## 2014-10-24 NOTE — Anesthesia Preprocedure Evaluation (Addendum)

## 2014-10-24 NOTE — H&P (Signed)
Kendra Rodriguez is an 48 y.o. female.   Chief Complaint: Pain in Left Leg. HPI: Progressive pain and Weakness of Left Foot.  Past Medical History  Diagnosis Date  . Dizziness   . Acute sinusitis   . Leg pain, left     with swelling  . Thyroid disease     TOLD BLOOD LEVELS SLIGHTLY ABNORMAL - BUT NOT REQUIRED MEDICATION  . Anxiety   . Allergy   . Peripheral vascular disease     HX OF TREATMENT FOR VARICOSE VEINS RIGHT LEG  . Ear ache     RIGHT --HX OF MULTIPLE EAR INFECTIONS AS A CHILD   . Depression     NO MEDICATIONS - DOING OK NOW  . GERD (gastroesophageal reflux disease)   . Headache(784.0)     MIGRAINES  . Lumbar stenosis     PAIN BACK AND LEFT LEG AND NUMBNESS LEG AND FOOT    Past Surgical History  Procedure Laterality Date  . Cholecystectomy    . Bladder lift      AT SAME TIME OF HYSTERECTOMY  . Abdominal hysterectomy      Family History  Problem Relation Age of Onset  . Hypertension Mother   . Arthritis Mother   . Diabetes Mother    Social History:  reports that she has never smoked. She has never used smokeless tobacco. She reports that she does not drink alcohol or use illicit drugs.  Allergies: No Known Allergies  Medications Prior to Admission  Medication Sig Dispense Refill  . Cholecalciferol (VITAMIN D PO) Take by mouth. TWICE A WEEK ON TUES AND THURS    . dexlansoprazole (DEXILANT) 60 MG capsule Take 60 mg by mouth every morning.     . Loratadine (CLARITIN PO) Take by mouth. AS NEEDED    . Omega-3 Fatty Acids (FISH OIL PO) Take 1 tablet by mouth every morning.     Marland Kitchen. amoxicillin (AMOXIL) 875 MG tablet Take 1 tablet (875 mg total) by mouth 2 (two) times daily. (Patient not taking: Reported on 10/13/2014) 20 tablet 0    No results found for this or any previous visit (from the past 48 hour(s)). No results found.  Review of Systems  Constitutional: Negative.   HENT: Negative.   Eyes: Negative.   Respiratory: Negative.   Cardiovascular: Negative.    Gastrointestinal: Negative.   Genitourinary: Negative.   Musculoskeletal: Positive for back pain.  Skin: Negative.   Neurological:       Weakness of Dorsiflexion of Left Foot.  Endo/Heme/Allergies: Negative.   Psychiatric/Behavioral: Negative.     Blood pressure 121/73, pulse 80, temperature 98.3 F (36.8 C), temperature source Oral, resp. rate 16, height 5' 3.5" (1.613 m), weight 91.173 kg (201 lb), SpO2 99 %. Physical Exam  Constitutional: She appears well-developed.  HENT:  Head: Normocephalic.  Eyes: Pupils are equal, round, and reactive to light.  Neck: Normal range of motion.  Cardiovascular: Normal rate.   Respiratory: Effort normal.  GI: Soft.  Musculoskeletal:  Weakness of Dorsiflexors of left Foot  Neurological:  Weakness of Dorsiflexors of left Foot.     Assessment/Plan Complete Decompressive Lumbar Laminectomy at L-4-L-5  Takeesha Isley A 10/24/2014, 12:44 PM

## 2014-10-24 NOTE — Progress Notes (Signed)
Received a call from the nurse about worsening left shoulder and scapula pressures/tightness that began about 1700. Pt has no cardiac history and her vitals have been stable since surgery today. New EKG was normal sinus rhythm with no change to prior ekg. Chest x-ray was negative. Suspect anxiety vs GERD episode, which pt has a history of both. Treated pt with one time doses of nitro, aspirin and oxygen and waited for any effect. If no improvement recommended one time dose of pepcid IV and 0.5 mg of ativan and to called for any worsening symptoms so that I may get the hospitalist involved.  Kendra OverallBrad Lashawnda Hancox PA-C

## 2014-10-25 ENCOUNTER — Encounter (HOSPITAL_COMMUNITY): Payer: Self-pay | Admitting: Orthopedic Surgery

## 2014-10-25 DIAGNOSIS — M5126 Other intervertebral disc displacement, lumbar region: Secondary | ICD-10-CM | POA: Diagnosis present

## 2014-10-25 MED ORDER — HYDROXYZINE HCL 10 MG PO TABS
10.0000 mg | ORAL_TABLET | Freq: Three times a day (TID) | ORAL | Status: DC | PRN
  Administered 2014-10-25: 10 mg via ORAL
  Filled 2014-10-25 (×2): qty 1

## 2014-10-25 MED ORDER — OXYCODONE-ACETAMINOPHEN 5-325 MG PO TABS: 1.0000 | ORAL_TABLET | ORAL | Status: DC | PRN

## 2014-10-25 MED ORDER — CHLORHEXIDINE GLUCONATE CLOTH 2 % EX PADS
6.0000 | MEDICATED_PAD | Freq: Every day | CUTANEOUS | Status: DC
  Administered 2014-10-25: 6 via TOPICAL

## 2014-10-25 MED ORDER — METHOCARBAMOL 500 MG PO TABS
500.0000 mg | ORAL_TABLET | Freq: Four times a day (QID) | ORAL | Status: DC | PRN
Start: 2014-10-25 — End: 2015-07-08

## 2014-10-25 MED ORDER — MUPIROCIN 2 % EX OINT
1.0000 "application " | TOPICAL_OINTMENT | Freq: Two times a day (BID) | CUTANEOUS | Status: DC
  Filled 2014-10-25: qty 22

## 2014-10-25 MED ORDER — DIPHENHYDRAMINE HCL 25 MG PO CAPS
25.0000 mg | ORAL_CAPSULE | Freq: Four times a day (QID) | ORAL | Status: DC | PRN
  Filled 2014-10-25: qty 1

## 2014-10-25 NOTE — Evaluation (Signed)
Occupational Therapy Evaluation Patient Details Name: Kendra Rodriguez MRN: [id number] DOB: 1966-03-16 Today's Date: 10/25/2014    History of Present Illness 48 yo spanish speaking female s/p L4-L5 decompression, discectomy 10/24/14. Pt is from home-family present and able to assist as needed.   Clinical Impression   Patient evaluated by Occupational Therapy with no further acute OT needs identified. All education has been completed and the patient has no further questions. All education completed.  Family is very supportive.  See below for any follow-up Occupational Therapy or equipment needs. OT is signing off. Thank you for this referral.      Follow Up Recommendations  No OT follow up;Supervision/Assistance - 24 hour    Equipment Recommendations  None recommended by OT    Recommendations for Other Services       Precautions / Restrictions Precautions Precautions: Back Precaution Booklet Issued: Yes (comment) Precaution Comments: Issued back precaution handout. Daughter stated she would translate for pt and that handout in spanish was not needed. Restrictions Weight Bearing Restrictions: No      Mobility Bed Mobility Overal bed mobility: Needs Assistance Bed Mobility: Rolling;Sidelying to Sit;Supine to Sit;Sit to Sidelying Rolling: Supervision Sidelying to sit: Min guard     Sit to sidelying: Min assist General bed mobility comments: Pt has a high bed at home, so simulated with similar height.  Pt requires min A to lift LEs onto bed.  Spouse is able to assist.  Anticipate when her pain is better, she will be able to perform with supervision   Transfers Overall transfer level: Needs assistance   Transfers: Sit to/from Stand;Stand Pivot Transfers Sit to Stand: Supervision         General transfer comment: close guard for safety. Multimodal cues for safety, technique, hand placement    Balance Overall balance assessment: No apparent balance deficits (not formally  assessed)                                          ADL Overall ADL's : Needs assistance/impaired Eating/Feeding: Independent   Grooming: Wash/dry hands;Wash/dry face;Oral care;Supervision/safety;Standing Grooming Details (indicate cue type and reason): Pt instructed to avoid bending when brushing teeth - use a cup and bring it to her mouth Upper Body Bathing: Set up;Sitting   Lower Body Bathing: Sit to/from stand;Minimal assistance;Adhering to back precautions Lower Body Bathing Details (indicate cue type and reason): Pt able to simulate shower in standing with supervision.  Discussed use of LH sponge vs family assisting  Upper Body Dressing : Supervision/safety;Sitting   Lower Body Dressing: Moderate assistance;Adhering to back precautions;Sit to/from stand Lower Body Dressing Details (indicate cue type and reason): Pt unable to cross ankles over knees to access feet for LB ADLs.  Discussed safe technique with pt, daughter, and spouse.   They report they will assist her as needed at discharge.  Did discuss AE, but they report they will assist her.  Toilet Transfer: Supervision/safety;Ambulation;Comfort height toilet Toilet Transfer Details (indicate cue type and reason): Pt instructed in safe technique Toileting- Clothing Manipulation and Hygiene: Supervision/safety;Sit to/from stand   Tub/ Shower Transfer: Walk-in shower;Supervision/safety;Adhering to back precautions   Functional mobility during ADLs: Supervision/safety General ADL Comments: Family is very supportive and will assist pt as needed      Vision  Perception     Praxis      Pertinent Vitals/Pain Pain Assessment: 0-10 Pain Score: 8  Pain Location: back  Pain Descriptors / Indicators: Aching;Constant;Grimacing;Guarding Pain Intervention(s): Monitored during session;Repositioned;Patient requesting pain meds-RN notified     Hand Dominance     Extremity/Trunk  Assessment Upper Extremity Assessment Upper Extremity Assessment: Overall WFL for tasks assessed   Lower Extremity Assessment Lower Extremity Assessment: Defer to PT evaluation   Cervical / Trunk Assessment Cervical / Trunk Assessment: Normal   Communication Communication Communication:  (Pt's daughter present and interpretted)   Cognition Arousal/Alertness: Awake/alert Behavior During Therapy: WFL for tasks assessed/performed Overall Cognitive Status: Within Functional Limits for tasks assessed                     General Comments       Exercises       Shoulder Instructions      Home Living Family/patient expects to be discharged to:: Private residence Living Arrangements: Children;Spouse/significant other   Type of Home: House Home Access: Stairs to enter Secretary/administratorntrance Stairs-Number of Steps: 4 Entrance Stairs-Rails: Right Home Layout: One level     Bathroom Shower/Tub: Producer, television/film/videoWalk-in shower   Bathroom Toilet: Standard     Home Equipment: None          Prior Functioning/Environment Level of Independence: Independent             OT Diagnosis: Acute pain   OT Problem List: Pain   OT Treatment/Interventions:      OT Goals(Current goals can be found in the care plan section) Acute Rehab OT Goals Patient Stated Goal: decreased pain  OT Goal Formulation: All assessment and education complete, DC therapy  OT Frequency:     Barriers to D/C:            Co-evaluation              End of Session Nurse Communication: Mobility status;Patient requests pain meds  Activity Tolerance: Patient tolerated treatment well Patient left: in chair;with call bell/phone within reach;with family/visitor present   Time: 4098-11911314-1327 OT Time Calculation (min): 13 min Charges:  OT General Charges $OT Visit: 1 Procedure OT Evaluation $Initial OT Evaluation Tier I: 1 Procedure OT Treatments $Self Care/Home Management : 8-22 mins G-Codes:    Kendra Rodriguez  M 10/25/2014, 1:44 PM

## 2014-10-25 NOTE — Anesthesia Postprocedure Evaluation (Signed)
  Anesthesia Post-op Note  Patient: Kendra Rodriguez  Procedure(s) Performed: Procedure(s) (LRB): COMPLETE LUMBAR DECOMPRESSION L4-L5 CENTRAL AND MICRODISCECTOMY L4-L5 LEFT (Left)  Patient Location: PACU  Anesthesia Type: General  Level of Consciousness: awake and alert   Airway and Oxygen Therapy: Patient Spontanous Breathing  Post-op Pain: mild  Post-op Assessment: Post-op Vital signs reviewed, Patient's Cardiovascular Status Stable, Respiratory Function Stable, Patent Airway and No signs of Nausea or vomiting  Last Vitals:  Filed Vitals:   10/25/14 1000  BP: 100/62  Pulse: 65  Temp: 36.7 C  Resp: 18    Post-op Vital Signs: stable   Complications: No apparent anesthesia complications

## 2014-10-25 NOTE — Discharge Instructions (Signed)
For the first few days, remove your dressing, tape a piece of saran wrap over your incision.  Take your shower, then remove the saran wrap and put a clean dressing on. After two days you can shower without the saran wrap.  Change your dressing daily. Shower only, no tub bath. Call if any temperatures greater than 101 or any wound complications: [phone] during the day and ask for Dr. Jeannetta EllisGioffre's nurse, Mackey Birchwoodammy Johnson.

## 2014-10-25 NOTE — Evaluation (Signed)
Physical Therapy Evaluation Patient Details Name: Kendra Rodriguez Fake MRN: [id number] DOB: 30-Jun-1966 Today's Date: 10/25/2014   History of Present Illness  48 yo spanish speaking female s/p L4-L5 decompression, discectomy 10/24/14. Pt is from home-family present and able to assist as needed.  Clinical Impression  On eval, pt was Min guard assist for mobility-able to ambulate ~150 feet without assistive device and to climb 2 steps with use of rail. Pt tolerated activity fairly well-pain 7/10 with activity. Daughter present and assisted with translation.  Discussed car transfer. All education complete.    Follow Up Recommendations No PT follow up;Supervision/Assistance - 24 hour    Equipment Recommendations  None recommended by PT    Recommendations for Other Services       Precautions / Restrictions Precautions Precautions: Back Precaution Booklet Issued: Yes (comment) Precaution Comments: Issued back precaution handout. Daughter stated she would translate for pt and that handout in spanish was not needed. Restrictions Weight Bearing Restrictions: No      Mobility  Bed Mobility Overal bed mobility: Needs Assistance Bed Mobility: Rolling;Sidelying to Sit Rolling: Supervision Sidelying to sit: Min guard       General bed mobility comments: close guard for safety.  Increased time. Multimodal cues for safety, technique, hand placement. Asked OT to attempt to practice getting back into high bed with pt, if able.   Transfers Overall transfer level: Needs assistance   Transfers: Sit to/from Stand Sit to Stand: Min guard         General transfer comment: close guard for safety. Multimodal cues for safety, technique, hand placement  Ambulation/Gait Ambulation/Gait assistance: Min guard Ambulation Distance (Feet): 150 Feet Assistive device: None Gait Pattern/deviations: Step-through pattern;Decreased stride length     General Gait Details: guarded gait pattern due to back  discomfort/pain. Pt tolerated fairly well.   Stairs Stairs: Yes   Stair Management: One rail Right;Step to pattern;Forwards Number of Stairs: 2 General stair comments: Multimodal cues for safety, technique. close guard for safety.  Wheelchair Mobility    Modified Rankin (Stroke Patients Only)       Balance                                             Pertinent Vitals/Pain Pain Assessment: 0-10 Pain Score: 7  Pain Location: back Pain Intervention(s): Limited activity within patient's tolerance;Monitored during session;Repositioned;Premedicated before session    Home Living Family/patient expects to be discharged to:: Private residence Living Arrangements: Children;Spouse/significant other   Type of Home: House Home Access: Stairs to enter Entrance Stairs-Rails: Right Entrance Stairs-Number of Steps: 4 Home Layout: One level Home Equipment: None      Prior Function Level of Independence: Independent               Hand Dominance        Extremity/Trunk Assessment   Upper Extremity Assessment: Defer to OT evaluation           Lower Extremity Assessment: Overall WFL for tasks assessed      Cervical / Trunk Assessment: Normal  Communication   Communication: Prefers language other than English  Cognition Arousal/Alertness: Awake/alert Behavior During Therapy: WFL for tasks assessed/performed Overall Cognitive Status: Within Functional Limits for tasks assessed                      General Comments  Exercises        Assessment/Plan    PT Assessment Patent does not need any further PT services  PT Diagnosis Difficulty walking;Acute pain   PT Problem List Decreased activity tolerance;Decreased mobility;Pain  PT Treatment Interventions     PT Goals (Current goals can be found in the Care Plan section) Acute Rehab PT Goals Patient Stated Goal: less pain.  PT Goal Formulation: All assessment and education  complete, DC therapy    Frequency     Barriers to discharge        Co-evaluation               End of Session   Activity Tolerance: Patient limited by pain Patient left: in chair;with call bell/phone within reach;with family/visitor present           Time: [id number] PT Time Calculation (min) (ACUTE ONLY): 21 min   Charges:   PT Evaluation $Initial PT Evaluation Tier I: 1 Procedure PT Treatments $Gait Training: 8-22 mins   PT G Codes:          Rebeca AlertJannie Shalan Neault, MPT Pager: [phone]

## 2014-10-25 NOTE — Progress Notes (Signed)
Pt c/o stabbing type pain and pressure at left shoulder blade area, no other s/s or complaints. BP and stats fine. PA Dixon advised and order for stat EKG, and stat  portable cxray. EKG showed NSR/ PA advised. Cxray normal also. PA advised, pain continued, PA Dixon ordered stat Nitro SL x1.  IV pepcid and ativan also ordered if pain continued.  Ativan given and pt able to go to sleep.

## 2014-10-25 NOTE — Progress Notes (Signed)
   Subjective: 1 Day Post-Op Procedure(s) (LRB): COMPLETE LUMBAR DECOMPRESSION L4-L5 CENTRAL AND MICRODISCECTOMY L4-L5 LEFT (Left) Patient reports pain as moderate.   Patient seen in rounds with Dr. Darrelyn HillockGioffre. Patient is doing well. She reports that she is feeling better in regards to the scapular back pain that she had last night. No chest pain or SOB. Having some itching. No leg pain.  Plan is to go Home after hospital stay.  Objective: Vital signs in last 24 hours: Temp:  [97.8 F (36.6 C)-99.1 F (37.3 C)] 98.2 F (36.8 C) (12/23 [id number]) Pulse Rate:  [62-80] 71 (12/23 0611) Resp:  [9-18] 17 (12/23 0611) BP: (97-121)/(46-73) 98/56 mmHg (12/23 0611) SpO2:  [95 %-100 %] 98 % (12/23 0611) Weight:  [91.173 kg (201 lb)] 91.173 kg (201 lb) (12/22 1039)  Intake/Output from previous day:  Intake/Output Summary (Last 24 hours) at 10/25/14 [id number] Last data filed at 10/25/14 [id number]  Gross per 24 hour  Intake   2460 ml  Output   1250 ml  Net   1210 ml    Intake/Output this shift: Total I/O In: -  Out: 350 [Urine:350]   EXAM General - Patient is Alert and Oriented Extremity - Neurologically intact Intact pulses distally Dorsiflexion/Plantar flexion intact Dressing/Incision - clean, dry, no drainage Motor Function - intact, moving foot and toes well on exam.   Past Medical History  Diagnosis Date  . Dizziness   . Acute sinusitis   . Leg pain, left     with swelling  . Thyroid disease     TOLD BLOOD LEVELS SLIGHTLY ABNORMAL - BUT NOT REQUIRED MEDICATION  . Anxiety   . Allergy   . Peripheral vascular disease     HX OF TREATMENT FOR VARICOSE VEINS RIGHT LEG  . Ear ache     RIGHT --HX OF MULTIPLE EAR INFECTIONS AS A CHILD   . Depression     NO MEDICATIONS - DOING OK NOW  . GERD (gastroesophageal reflux disease)   . Headache(784.0)     MIGRAINES  . Lumbar stenosis     PAIN BACK AND LEFT LEG AND NUMBNESS LEG AND FOOT    Assessment/Plan: 1 Day Post-Op Procedure(s)  (LRB): COMPLETE LUMBAR DECOMPRESSION L4-L5 CENTRAL AND MICRODISCECTOMY L4-L5 LEFT (Left) Active Problems:   Spinal stenosis, lumbar region, with neurogenic claudication   Lumbar disc herniation  Estimated body mass index is 35.04 kg/(m^2) as calculated from the following:   Height as of this encounter: 5' 3.5" (1.613 m).   Weight as of this encounter: 91.173 kg (201 lb). Advance diet Up with therapy D/C IV fluids Discharge home   DVT Prophylaxis - Aspirin Weight-Bearing as tolerated   Doing well. On atarax for itching. Will walk with PT this morning. DC home after PT. Follow up in office in 2 weeks.   Dimitri PedAmber Shelah Heatley, PA-C Orthopaedic Surgery 10/25/2014, 9:39 AM

## 2014-10-30 NOTE — Discharge Summary (Signed)
Physician Discharge Summary   Patient ID: Kendra Rodriguez MRN: [id number] DOB/AGE: [birth year] 49 y.o.  Admit date: 10/24/2014 Discharge date: 10/25/2014  Primary Diagnosis: Lumbar disc herniation  Admission Diagnoses:  Past Medical History  Diagnosis Date  . Dizziness   . Acute sinusitis   . Leg pain, left     with swelling  . Thyroid disease     TOLD BLOOD LEVELS SLIGHTLY ABNORMAL - BUT NOT REQUIRED MEDICATION  . Anxiety   . Allergy   . Peripheral vascular disease     HX OF TREATMENT FOR VARICOSE VEINS RIGHT LEG  . Ear ache     RIGHT --HX OF MULTIPLE EAR INFECTIONS AS A CHILD   . Depression     NO MEDICATIONS - DOING OK NOW  . GERD (gastroesophageal reflux disease)   . Headache(784.0)     MIGRAINES  . Lumbar stenosis     PAIN BACK AND LEFT LEG AND NUMBNESS LEG AND FOOT   Discharge Diagnoses:   Active Problems:   Spinal stenosis, lumbar region, with neurogenic claudication   Lumbar disc herniation  Estimated body mass index is 35.04 kg/(m^2) as calculated from the following:   Height as of this encounter: 5' 3.5" (1.613 m).   Weight as of this encounter: 91.173 kg (201 lb).  Procedure:  Procedure(s) (LRB): COMPLETE LUMBAR DECOMPRESSION L4-L5 CENTRAL AND MICRODISCECTOMY L4-L5 LEFT (Left)   Consults: None  HPI: The patient developed low back pain with progressively worsening left LE pain and left foot weakness.    Laboratory Data: Hospital Outpatient Visit on 10/18/2014  Component Date Value Ref Range Status  . WBC 10/18/2014 5.4  4.0 - 10.5 K/uL Final  . RBC 10/18/2014 4.87  3.87 - 5.11 MIL/uL Final  . Hemoglobin 10/18/2014 13.3  12.0 - 15.0 g/dL Final  . HCT 10/18/2014 39.9  36.0 - 46.0 % Final  . MCV 10/18/2014 81.9  78.0 - 100.0 fL Final  . MCH 10/18/2014 27.3  26.0 - 34.0 pg Final  . MCHC 10/18/2014 33.3  30.0 - 36.0 g/dL Final  . RDW 10/18/2014 12.6  11.5 - 15.5 % Final  . Platelets 10/18/2014 289  150 - 400 K/uL Final  . MRSA, PCR 10/18/2014 NEGATIVE   NEGATIVE Final  . Staphylococcus aureus 10/18/2014 POSITIVE* NEGATIVE Final   Comment:        The Xpert SA Assay (FDA approved for NASAL specimens in patients over 49 years of age), is one component of a comprehensive surveillance program.  Test performance has been validated by [employer] for patients greater than or equal to 49 year old. It is not intended to diagnose infection nor to guide or monitor treatment.   . Sodium 10/18/2014 138  137 - 147 mEq/L Final  . Potassium 10/18/2014 4.4  3.7 - 5.3 mEq/L Final  . Chloride 10/18/2014 100  96 - 112 mEq/L Final  . CO2 10/18/2014 26  19 - 32 mEq/L Final  . Glucose, Bld 10/18/2014 110* 70 - 99 mg/dL Final  . BUN 10/18/2014 15  6 - 23 mg/dL Final  . Creatinine, Ser 10/18/2014 0.50  0.50 - 1.10 mg/dL Final  . Calcium 10/18/2014 9.9  8.4 - 10.5 mg/dL Final  . Total Protein 10/18/2014 8.1  6.0 - 8.3 g/dL Final  . Albumin 10/18/2014 4.1  3.5 - 5.2 g/dL Final  . AST 10/18/2014 17  0 - 37 U/L Final  . ALT 10/18/2014 19  0 - 35 U/L Final  . Alkaline Phosphatase 10/18/2014  118* 39 - 117 U/L Final  . Total Bilirubin 10/18/2014 0.4  0.3 - 1.2 mg/dL Final  . GFR calc non Af Amer 10/18/2014 >90  >90 mL/min Final  . GFR calc Af Amer 10/18/2014 >90  >90 mL/min Final   Comment: (NOTE) The eGFR has been calculated using the CKD EPI equation. This calculation has not been validated in all clinical situations. eGFR's persistently <90 mL/min signify possible Chronic Kidney Disease.   . Anion gap 10/18/2014 12  5 - 15 Final  . Prothrombin Time 10/18/2014 14.8  11.6 - 15.2 seconds Final  . INR 10/18/2014 1.15  0.00 - 1.49 Final  . Color, Urine 10/18/2014 YELLOW  YELLOW Final  . APPearance 10/18/2014 CLEAR  CLEAR Final  . Specific Gravity, Urine 10/18/2014 1.017  1.005 - 1.030 Final  . pH 10/18/2014 7.5  5.0 - 8.0 Final  . Glucose, UA 10/18/2014 NEGATIVE  NEGATIVE mg/dL Final  . Hgb urine dipstick 10/18/2014 NEGATIVE  NEGATIVE Final  .  Bilirubin Urine 10/18/2014 NEGATIVE  NEGATIVE Final  . Ketones, ur 10/18/2014 NEGATIVE  NEGATIVE mg/dL Final  . Protein, ur 10/18/2014 NEGATIVE  NEGATIVE mg/dL Final  . Urobilinogen, UA 10/18/2014 0.2  0.0 - 1.0 mg/dL Final  . Nitrite 10/18/2014 NEGATIVE  NEGATIVE Final  . Leukocytes, UA 10/18/2014 NEGATIVE  NEGATIVE Final   MICROSCOPIC NOT DONE ON URINES WITH NEGATIVE PROTEIN, BLOOD, LEUKOCYTES, NITRITE, OR GLUCOSE <1000 mg/dL.     X-Rays:Dg Chest 2 View  10/18/2014   CLINICAL DATA:  Preop lumbar surgery.  EXAM: CHEST  2 VIEW  COMPARISON:  None.  FINDINGS: The heart size and mediastinal contours are within normal limits. Both lungs are clear. The visualized skeletal structures are unremarkable.  IMPRESSION: No active cardiopulmonary disease.   Electronically Signed   By: Rolm Baptise M.D.   On: 10/18/2014 13:36   Dg Lumbar Spine 2-3 Views  10/24/2014   ADDENDUM REPORT: 10/24/2014 12:12  ADDENDUM: Per request, the exam images are addended with labeling of the lumbar spinal segment levels.  To review the annotations of the segment levels, in may be necessary to view a different presentation state in PACs than what may be displayed when the case is initially opened.  Comparison made with prior CT myelogram of 06/02/2014, which was labeled with 5 lumbar segment levels.  Current radiographs are labeled similarly.   Electronically Signed   By: Lavonia Dana M.D.   On: 10/24/2014 12:12   10/24/2014   CLINICAL DATA:  Low back pain radiating down left leg.  EXAM: LUMBAR SPINE - 2-3 VIEW  COMPARISON:  01/30/2014  FINDINGS: Slight leftward scoliosis of the lumbar spine. Degenerative facet disease in the lower lumbar spine. No fracture. Slight anterolisthesis of L4 on L5 related to facet disease. This is stable. SI joints are symmetric and unremarkable.  IMPRESSION: Stable degenerative changes.  No acute bony abnormality.  Electronically Signed: By: Rolm Baptise M.D. On: 10/18/2014 13:43   Dg Chest Port 1  View  10/24/2014   CLINICAL DATA:  Left shoulder pain, left scapular pain  EXAM: PORTABLE CHEST - 1 VIEW  COMPARISON:  10/18/2014  FINDINGS: Cardiomediastinal silhouette is stable. No acute infiltrate or pleural effusion. No pulmonary edema. Bony thorax is unremarkable.  IMPRESSION: No active disease.   Electronically Signed   By: Lahoma Crocker M.D.   On: 10/24/2014 22:59   Dg Spine Portable 1 View  10/24/2014   CLINICAL DATA:  Intraoperative lumbar decompression L4-5  EXAM: PORTABLE SPINE -  1 VIEW  COMPARISON:  Study obtained earlier in the day  FINDINGS: This cross-table lateral portable lumbar images labeled image number 5 with time stamped 1433:50. Metallic probe overlies the L4-5 interspace from a posterior approach. There is slight anterolisthesis of L4 on L5. No fracture.  IMPRESSION: Metallic probe overlies the posterior aspect of the L4-5 interspace.   Electronically Signed   By: Lowella Grip M.D.   On: 10/24/2014 14:49   Dg Spine Portable 1 View  10/24/2014   CLINICAL DATA:  48 year old female undergoing lumbar surgery. Initial encounter.  EXAM: PORTABLE SPINE - 1 VIEW  COMPARISON:  Intraoperative film at 1339 hr the same day and earlier.  FINDINGS: Portable AP view at film lumbar spine at 1404 hrs. Two surgical probes in place, the more cephalad probe tip is at the L4 pedicle level. More caudal probe tip is at or just below the L5 pedicle level.  IMPRESSION: Intraoperative localization as above.   Electronically Signed   By: Lars Pinks M.D.   On: 10/24/2014 14:20   Dg Spine Portable 1 View  10/24/2014   CLINICAL DATA:  Intraoperative lumbar decompression  EXAM: PORTABLE SPINE - 1 VIEW  COMPARISON:  Study obtained earlier in the day  FINDINGS: Current cross-table lateral lumbar images labeled 3. There is a metallic probe with the tip posterior to the superior most aspect of the L4 vertebral body. There is no fracture. There is slight anterolisthesis of L4 on L5.  IMPRESSION: Metallic probe  tip is posterior to the superior most aspect of the L4 vertebral body.   Electronically Signed   By: Lowella Grip M.D.   On: 10/24/2014 14:02   Dg Spine Portable 1 View  10/24/2014   CLINICAL DATA:  48 year old female undergoing lumbar surgery. Initial encounter.  EXAM: PORTABLE SPINE - 1 VIEW  COMPARISON:  Intraoperative radiograph 1238 hr today. Preoperative films 10/18/2014.  FINDINGS: Intraoperative portable cross-table lateral view of the lumbar spine labeled film #2 At 1321 hrs.  Cephalad Surgical clamp or probe is situated between the L3 and L4 spinous processes. The caudal Surgical clamp or probe is just below the L4 spinous process. Grade 1 anterolisthesis re- identified at L4-L5.  IMPRESSION: Intraoperative localization as above.   Electronically Signed   By: Lars Pinks M.D.   On: 10/24/2014 13:51   Dg Spine Portable 1 View  10/24/2014   CLINICAL DATA:  Central lumbar decompression L4-L5. Intraoperative film.  EXAM: PORTABLE SPINE - 1 VIEW  COMPARISON:  10/18/2014  FINDINGS: Portable cross-table lateral view of the lumbar spine demonstrates surgical instruments localizing to posterior L3-L4 and L4-L5. Minimal anterolisthesis of L4 on L5, stable to preoperative exam. Facet arthropathy is seen at L4-L5 and L5-S1.  IMPRESSION: Surgical instruments localizing L3-L4 and L4-L5 during central lumbar decompression.   Electronically Signed   By: Jeb Levering M.D.   On: 10/24/2014 13:38    EKG: Orders placed or performed during the hospital encounter of 10/24/14  . EKG 12-Lead  . EKG 12-Lead  . EKG 12-Lead  . EKG 12-Lead  . EKG 12-Lead  . EKG 12-Lead     Hospital Course: Kendra Rodriguez is a 48 y.o. who was admitted to [facility]. They were brought to the operating room on 10/24/2014 and underwent Procedure(s): COMPLETE LUMBAR DECOMPRESSION L4-L5 CENTRAL AND MICRODISCECTOMY L4-L5 LEFT.  Patient tolerated the procedure well and was later transferred to the recovery room and then  to the orthopaedic floor for postoperative care.  They  were given PO and IV analgesics for pain control following their surgery.  They were given 24 hours of postoperative antibiotics of  Anti-infectives    Start     Dose/Rate Route Frequency Ordered Stop   10/24/14 2000  ceFAZolin (ANCEF) IVPB 1 g/50 mL premix     1 g100 mL/hr over 30 Minutes Intravenous Every 8 hours 10/24/14 1643 10/25/14 1230   10/24/14 1326  polymyxin B 500,000 Units, bacitracin 50,000 Units in sodium chloride irrigation 0.9 % 500 mL irrigation  Status:  Discontinued       As needed 10/24/14 1326 10/24/14 1509   10/24/14 1032  ceFAZolin (ANCEF) IVPB 2 g/50 mL premix     2 g100 mL/hr over 30 Minutes Intravenous On call to O.R. 10/24/14 1032 10/24/14 1301     and started on DVT prophylaxis in the form of Aspirin.   PT was ordered.  Discharge planning consulted to help with postop disposition and equipment needs.  Patient had a fair night on the evening of surgery.  They started to get up OOB with therapy on day one. Patient was seen in rounds and was ready to go home.   Diet: Regular diet Activity:WBAT Follow-up:in 2 weeks Disposition - Home Discharged Condition: stable   Discharge Instructions    Call MD / Call 911    Complete by:  As directed   If you experience chest pain or shortness of breath, CALL 911 and be transported to the hospital emergency room.  If you develope a fever above 101 F, pus (white drainage) or increased drainage or redness at the wound, or calf pain, call your surgeon's office.     Constipation Prevention    Complete by:  As directed   Drink plenty of fluids.  Prune juice may be helpful.  You may use a stool softener, such as Colace (over the counter) 100 mg twice a day.  Use MiraLax (over the counter) for constipation as needed.     Diet general    Complete by:  As directed      Discharge instructions    Complete by:  As directed   For the first few days, remove your dressing, tape a piece  of saran wrap over your incision.  Take your shower, then remove the saran wrap and put a clean dressing on. After two days you can shower without the saran wrap.  Change your dressing daily. Shower only, no tub bath. Call if any temperatures greater than 101 or any wound complications: [phone] during the day and ask for Dr. Charlestine Night nurse, Brunilda Payor.     Driving restrictions    Complete by:  As directed   No driving for 2 weeks     Increase activity slowly as tolerated    Complete by:  As directed      Lifting restrictions    Complete by:  As directed   No lifting            Medication List    TAKE these medications        CLARITIN PO  Take by mouth. AS NEEDED     DEXILANT 60 MG capsule  Generic drug:  dexlansoprazole  Take 60 mg by mouth every morning.     FISH OIL PO  Take 1 tablet by mouth every morning.     methocarbamol 500 MG tablet  Commonly known as:  ROBAXIN  Take 1 tablet (500 mg total) by mouth every 6 (six) hours as needed  for muscle spasms.  Notes to Patient:  Muscle Relaxer     oxyCODONE-acetaminophen 5-325 MG per tablet  Commonly known as:  PERCOCET/ROXICET  Take 1-2 tablets by mouth every 4 (four) hours as needed for moderate pain.     VITAMIN D PO  Take by mouth. TWICE A WEEK ON TUES AND THURS           Follow-up Information    Follow up with GIOFFRE,RONALD A, MD. Schedule an appointment as soon as possible for a visit in 2 weeks.   Specialty:  Orthopedic Surgery   Contact information:   [address] [place] [address] [phone]       Signed: Ardeen Jourdain, PA-C Orthopaedic Surgery 10/30/2014, 2:21 PM

## 2015-04-15 ENCOUNTER — Ambulatory Visit (INDEPENDENT_AMBULATORY_CARE_PROVIDER_SITE_OTHER): Payer: BLUE CROSS/BLUE SHIELD | Admitting: Family Medicine

## 2015-04-15 VITALS — BP 108/72 | HR 74 | Temp 97.9°F | Resp 18 | Ht 64.0 in | Wt 203.0 lb

## 2015-04-15 DIAGNOSIS — G5601 Carpal tunnel syndrome, right upper limb: Secondary | ICD-10-CM

## 2015-04-15 DIAGNOSIS — G5602 Carpal tunnel syndrome, left upper limb: Secondary | ICD-10-CM | POA: Diagnosis not present

## 2015-04-15 DIAGNOSIS — G5603 Carpal tunnel syndrome, bilateral upper limbs: Secondary | ICD-10-CM

## 2015-04-15 MED ORDER — MELOXICAM 7.5 MG PO TABS: 7.5000 mg | ORAL_TABLET | Freq: Two times a day (BID) | ORAL | Status: DC

## 2015-04-15 NOTE — Progress Notes (Addendum)
Patient ID: Kendra Rodriguez, female   DOB: 12-Oct-1966, 49 y.o.   MRN: [id number]   This chart was scribed for Elvina Sidle, MD by [facility], medical scribe at [facility].The patient was seen in exam room 11 and the patient's care was started at 8:13 AM.  Patient ID: Kendra Rodriguez MRN: [id number], DOB: 1965/11/22, 49 y.o. Date of Encounter: 04/15/2015  Primary Physician: Gwynneth Aliment, MD  Chief Complaint:  Chief Complaint  Patient presents with   Generalized Body Aches    from head down toward hands x1 week    Numbness    x1 week in hand mostly at night    HPI:  Kendra Rodriguez is a 49 y.o. female who presents to [facility] complaining of numbness starting from her head traveling down to her hands and pain in her hands bilaterally. Worse on the right than the left. Her pain in her hands worsens with repetitive movement. This has been ongoing for week. She has a history of spinal stenosis. She sees [facility], but does not see a neurologist. PCP is at [facility]. Her daughter was present to aid in translation   Past Medical History  Diagnosis Date   Dizziness    Acute sinusitis    Leg pain, left     with swelling   Thyroid disease     TOLD BLOOD LEVELS SLIGHTLY ABNORMAL - BUT NOT REQUIRED MEDICATION   Anxiety    Allergy    Peripheral vascular disease     HX OF TREATMENT FOR VARICOSE VEINS RIGHT LEG   Ear ache     RIGHT --HX OF MULTIPLE EAR INFECTIONS AS A CHILD    Depression     NO MEDICATIONS - DOING OK NOW   GERD (gastroesophageal reflux disease)    Headache(784.0)     MIGRAINES   Lumbar stenosis     PAIN BACK AND LEFT LEG AND NUMBNESS LEG AND FOOT    Home Meds: Prior to Admission medications   Medication Sig Start Date End Date Taking? Authorizing Provider  Cholecalciferol (VITAMIN D PO) Take by mouth. TWICE A WEEK ON TUES AND THURS   Yes Historical Provider, MD  dexlansoprazole  (DEXILANT) 60 MG capsule Take 60 mg by mouth every morning.    Yes Historical Provider, MD  Loratadine (CLARITIN PO) Take by mouth. AS NEEDED   Yes Historical Provider, MD  rosuvastatin (CRESTOR) 10 MG tablet Take 10 mg by mouth daily.   Yes Historical Provider, MD  methocarbamol (ROBAXIN) 500 MG tablet Take 1 tablet (500 mg total) by mouth every 6 (six) hours as needed for muscle spasms. Patient not taking: Reported on 04/15/2015 10/25/14   Dimitri Ped, PA-C  Omega-3 Fatty Acids (FISH OIL PO) Take 1 tablet by mouth every morning.     Historical Provider, MD  oxyCODONE-acetaminophen (PERCOCET/ROXICET) 5-325 MG per tablet Take 1-2 tablets by mouth every 4 (four) hours as needed for moderate pain. Patient not taking: Reported on 04/15/2015 10/25/14   Dimitri Ped, PA-C    Allergies: No Known Allergies  History   Social History   Marital Status: Married    Spouse Name: N/A   Number of Children: N/A   Years of Education: N/A   Occupational History   Not on file.   Social History Main Topics   Smoking status: Never Smoker    Smokeless tobacco: Never Used   Alcohol Use: No   Drug Use: No  Sexual Activity: Yes   Other Topics Concern   Not on file   Social History Narrative    Review of Systems: Constitutional: negative for chills, fever, night sweats, weight changes, or fatigue  HEENT: negative for vision changes, hearing loss, congestion, rhinorrhea, ST, epistaxis, or sinus pressure Cardiovascular: negative for chest pain or palpitations Respiratory: negative for hemoptysis, wheezing, shortness of breath, or cough Abdominal: negative for abdominal pain, nausea, vomiting, diarrhea, or constipation Dermatological: negative for rash Msk: Positive for arthralgias  Neurologic: negative for headache, dizziness, or syncope. Positive for numbness All other systems reviewed and are otherwise negative with the exception to those above and in the HPI.  Physical  Exam: Blood pressure 108/72, pulse 74, temperature 97.9 F (36.6 C), temperature source Oral, resp. rate 18, height 5\' 4"  (1.626 m), weight 203 lb (92.08 kg), SpO2 98 %., Body mass index is 34.83 kg/(m^2). General: Well developed, well nourished, in no acute distress. Head: Normocephalic, atraumatic, eyes without discharge, sclera non-icteric, nares are without discharge. Bilateral auditory canals clear, TM's are without perforation, pearly grey and translucent with reflective cone of light bilaterally. Oral cavity moist, posterior pharynx without exudate, erythema, peritonsillar abscess, or post nasal drip Neck: Supple. No thyromegaly. Full ROM. No lymphadenopathy. Msk:  Strength and tone normal for age. Extremities/Skin: Warm and dry. No clubbing or cyanosis. No edema. No rashes or suspicious lesions. Reproduction of pain with hyperextension of her wrist (Dorsi flexion). Negative Tinel's sign. Reflexes are normal. Full ROM and strength in her hands. Neuro: Alert and oriented X 3. Moves all extremities spontaneously. Gait is normal. CNII-XII grossly in tact.  Psych:  Responds to questions appropriately with a normal affect.  Patient speaks no English and so interview was conducted in Spanish primarily   ASSESSMENT AND PLAN:  49 y.o. year old female with   This chart was scribed in my presence and reviewed by me personally.    ICD-9-CM ICD-10-CM   1. Bilateral carpal tunnel syndrome 354.0 G56.01 meloxicam (MOBIC) 7.5 MG tablet    G56.02   follow up with PCP who is conducting endocrine workup related to the spinal stenosis in her back  Signed, Elvina Sidle, MD 04/15/2015 8:13 AM

## 2015-04-15 NOTE — Patient Instructions (Signed)
Other problems can cause carpal tunnel include: Low thyroid Low calcium Electrolyte imbalance Diabetes Other inflammatory conditions and the arthritis family  I don't think Mrs. Kiah has spinal stenosis in her neck, but it is a possibility as well as other nerve compression syndromes of the spine,    Sndrome del tnel carpiano  (Carpal Tunnel Syndrome)  El tnel carpiano es un espacio estrecho ubicado en el lado palmar de la [place]. Est formado por los huesos de la [place] y los ligamentos. Los nervios, vasos sanguneos y tendones pasan a travs del tnel carpiano. Los movimientos de la [place] o ciertas enfermedades pueden causar hinchazn del tnel. Esta hinchazn comprime el nervio principal en la mueca ((nervio mediano) y ocasiona un trastorno doloroso que se denomina sndrome del tnel carpiano. CAUSAS   Movimientos repetidos de [employer]  Lesiones en la [place].  Ciertas enfermedades como la artritis, la diabetes, el alcoholismo, el hipertiroidismo o la insuficiencia renal.  Obesidad.  Embarazo. SNTOMAS   Sensacin de "pinchazos" en los dedos o la mano.  Hormigueo o entumecimiento en los dedos o en la mano.  Sensacin dolorosa en todo el brazo.  Dolor en la mueca que sube por el brazo hasta el hombro.  Dolor que baja por la mano o los dedos.  Sensacin de [employer]. DIAGNSTICO  El mdico le har una historia clnica y un examen fsico. Puede ser necesario hacer un electromiograma. Esta prueba mide las seales elctricas enviadas por los msculos. Generalmente el paso de las seales elctricas es impedido por el sndrome del tnel carpiano. Tambin puede ser necesario que le tomen radiografas.  TRATAMIENTO  El sndrome del tnel carpiano puede curarse espontneamente sin tratamiento. El [employer] indicar el uso de un cabestrillo para la [place] o que tome medicamentos como antiinflamatorios no esteroides. Las inyecciones de cortisona pueden ayudar. A  veces es necesaria la ciruga para liberar el nervio comprimido.  INSTRUCCIONES PARA EL CUIDADO EN EL HOGAR   Tome todos los medicamentos segn le indic su mdico. Slo tome medicamentos de venta libre o recetados para Primary school teacher, las molestias o bajar la fiebre segn las indicaciones de su mdico.  Si le aconsejaron usar un cabestrillo para evitar que la [place] se doble, selo como le indicaron. Es importante que use el cabestrillo durante la noche. selo mientras sienta dolor o adormecimiento en la mano, el brazo o la [place]. Esto puede durar entre 1 y 2 meses.  Haga reposar la [place] de toda actividad que le cause dolor. Si sus sntomas estn relacionados con Kathie Dike, deber conversar con su empleador acerca de la posibilidad de cambiar a una tarea que no requiera el uso de la [place].  Aplique hielo en la mueca despus de los perodos prolongados de [place].  Ponga el hielo en una bolsa plstica.  Colquese una toalla entre la piel y la bolsa de hielo.  Deje el hielo en el lugar durante 15 a 20 minutos, 3 a 4 veces por da.  Cumpla con todas las visitas de control, segn le indique su mdico. Aqu se incluyen derivaciones a un ortopedista, fisioterapia y rehabilitacin. Gildardo Griffes en obtener la asistencia necesaria puede dar como resultado una demora o fracaso en la curacin. SOLICITE ATENCIN MDICA DE INMEDIATO SI:   Desarrolla nuevos e inexplicables sntomas.  Los sntomas actuales empeoran y la medicacin no los [place]. ASEGRESE DE QUE:   Comprende estas instrucciones.  Controlar su enfermedad.  Solicitar ayuda de inmediato si no mejora  o si empeora. Document Released: 10/20/2005 Document Revised: 07/14/2012 [facility] Patient Information 2015 [place], [place]. This information is not intended to replace advice given to you by your health care provider. Make sure you discuss any questions you have with your health care provider.

## 2015-07-08 ENCOUNTER — Ambulatory Visit (INDEPENDENT_AMBULATORY_CARE_PROVIDER_SITE_OTHER): Payer: BLUE CROSS/BLUE SHIELD | Admitting: Physician Assistant

## 2015-07-08 VITALS — BP 118/80 | HR 67 | Temp 98.0°F | Resp 16 | Ht 62.0 in | Wt 197.2 lb

## 2015-07-08 DIAGNOSIS — R079 Chest pain, unspecified: Secondary | ICD-10-CM

## 2015-07-08 DIAGNOSIS — R42 Dizziness and giddiness: Secondary | ICD-10-CM | POA: Diagnosis not present

## 2015-07-08 DIAGNOSIS — R519 Headache, unspecified: Secondary | ICD-10-CM

## 2015-07-08 DIAGNOSIS — R51 Headache: Secondary | ICD-10-CM | POA: Diagnosis not present

## 2015-07-08 DIAGNOSIS — R109 Unspecified abdominal pain: Secondary | ICD-10-CM

## 2015-07-08 LAB — POCT CBC
Granulocyte percent: 56.2 %G (ref 37–80)
HCT, POC: 41.7 % (ref 37.7–47.9)
HEMOGLOBIN: 13.5 g/dL (ref 12.2–16.2)
LYMPH, POC: 2.1 (ref 0.6–3.4)
MCH, POC: 26.1 pg — AB (ref 27–31.2)
MCHC: 32.4 g/dL (ref 31.8–35.4)
MCV: 80.7 fL (ref 80–97)
MID (CBC): 0.3 (ref 0–0.9)
MPV: 7 fL (ref 0–99.8)
POC Granulocyte: 3.1 (ref 2–6.9)
POC LYMPH PERCENT: 37.9 %L (ref 10–50)
POC MID %: 5.9 %M (ref 0–12)
Platelet Count, POC: 310 10*3/uL (ref 142–424)
RBC: 5.17 M/uL (ref 4.04–5.48)
RDW, POC: 13 %
WBC: 5.5 10*3/uL (ref 4.6–10.2)

## 2015-07-08 LAB — POCT GLYCOSYLATED HEMOGLOBIN (HGB A1C): Hemoglobin A1C: 6

## 2015-07-08 LAB — GLUCOSE, POCT (MANUAL RESULT ENTRY): POC Glucose: 113 mg/dl — AB (ref 70–99)

## 2015-07-08 MED ORDER — IBUPROFEN 600 MG PO TABS: 600.0000 mg | ORAL_TABLET | Freq: Three times a day (TID) | ORAL | Status: DC | PRN

## 2015-07-08 MED ORDER — MECLIZINE HCL 50 MG PO TABS: 25.0000 mg | ORAL_TABLET | Freq: Three times a day (TID) | ORAL | Status: DC | PRN

## 2015-07-08 NOTE — Progress Notes (Signed)
[facility] [address] [place] [address] [phone]  Date:  07/08/2015   Name:  Kendra Rodriguez   DOB:  [birth year]   MRN:  [id number]  PCP:  Gwynneth Aliment, MD    History of Present Illness:  Kendra Rodriguez is a 49 y.o. female patient with PMH of spinal stenosis, and pre-diabetes who presents to [facility] for chief complaint of fatigue, dizziness, and leg pain that started about 3 days ago.  Patient reports headache that feels as if here left eye is burning.  There is no vision changes.  She has some associated sob, nausea, and abdominal pain, though she reports that she recently started metformin for 2 weeks.  She stopped taking her metformin 3 days ago, once the abdominal pain started which has helped.  Dizziness occurs randomly and non-positional.   Reports that 4 days ago, she had a lower back injection for right leg pain.  The leg pain has improved, she notes.   She also has chest pain that has reoccurred over the last 4 days.  It is associated with some exertion, and relieved with rest.  This is an occurrence that has been happening for months however and with EKGs, and she reports that it has always proven normal, and without further workup. She also notes feeling hot all over, however no fever.     Patient Active Problem List   Diagnosis Date Noted  . Lumbar disc herniation 10/25/2014  . Spinal stenosis, lumbar region, with neurogenic claudication 10/24/2014  . Varicose veins of right leg with edema 01/30/2014    Past Medical History  Diagnosis Date  . Dizziness   . Acute sinusitis   . Leg pain, left     with swelling  . Thyroid disease     TOLD BLOOD LEVELS SLIGHTLY ABNORMAL - BUT NOT REQUIRED MEDICATION  . Anxiety   . Allergy   . Peripheral vascular disease     HX OF TREATMENT FOR VARICOSE VEINS RIGHT LEG  . Ear ache     RIGHT --HX OF MULTIPLE EAR INFECTIONS AS A CHILD   . Depression     NO MEDICATIONS - DOING OK NOW  . GERD (gastroesophageal  reflux disease)   . Headache(784.0)     MIGRAINES  . Lumbar stenosis     PAIN BACK AND LEFT LEG AND NUMBNESS LEG AND FOOT    Past Surgical History  Procedure Laterality Date  . Cholecystectomy    . Bladder lift      AT SAME TIME OF HYSTERECTOMY  . Abdominal hysterectomy    . Lumbar laminectomy/decompression microdiscectomy Left 10/24/2014    Procedure: COMPLETE LUMBAR DECOMPRESSION L4-L5 CENTRAL AND MICRODISCECTOMY L4-L5 LEFT;  Surgeon: Jacki Cones, MD;  Location: [facility];  Service: Orthopedics;  Laterality: Left;    Social History  Substance Use Topics  . Smoking status: Never Smoker   . Smokeless tobacco: Never Used  . Alcohol Use: No    Family History  Problem Relation Age of Onset  . Hypertension Mother   . Arthritis Mother   . Diabetes Mother     No Known Allergies  Medication list has been reviewed and updated.  Current Outpatient Prescriptions on File Prior to Visit  Medication Sig Dispense Refill  . dexlansoprazole (DEXILANT) 60 MG capsule Take 60 mg by mouth every morning.     . rosuvastatin (CRESTOR) 10 MG tablet Take 10 mg by mouth daily.    . Loratadine (CLARITIN  PO) Take by mouth. AS NEEDED     No current facility-administered medications on file prior to visit.    ROS ROS otherwise unremarkable unless listed above.   Physical Examination: BP 118/80 mmHg  Pulse 67  Temp(Src) 98 F (36.7 C) (Oral)  Resp 16  Ht 5\' 2"  (1.575 m)  Wt 197 lb 3.2 oz (89.449 kg)  BMI 36.06 kg/m2  SpO2 98% Ideal Body Weight: Weight in (lb) to have BMI = 25: 136.4  Physical Exam  Constitutional: She is oriented to person, place, and time. She appears well-developed and well-nourished. No distress.  HENT:  Head: Normocephalic and atraumatic.  Right Ear: External ear normal.  Left Ear: External ear normal.  Nose: Nose normal.  Mouth/Throat: Oropharynx is clear and moist. No oropharyngeal exudate.  Eyes: Conjunctivae are normal. Pupils are equal, round, and  reactive to light.  Cardiovascular: Normal rate, regular rhythm, normal heart sounds and intact distal pulses.  Exam reveals no friction rub.   No murmur heard. Pulmonary/Chest: Effort normal and breath sounds normal. No respiratory distress. She has no wheezes.  Musculoskeletal: She exhibits no edema.  Neurological: She is alert and oriented to person, place, and time. She has normal strength. She displays normal reflexes. No cranial nerve deficit. She exhibits normal muscle tone. She displays a negative Romberg sign.  Skin: Skin is warm and dry. She is not diaphoretic.  Psychiatric: She has a normal mood and affect. Her behavior is normal.    Results for orders placed or performed in visit on 07/08/15  POCT CBC  Result Value Ref Range   WBC 5.5 4.6 - 10.2 K/uL   Lymph, poc 2.1 0.6 - 3.4   POC LYMPH PERCENT 37.9 10 - 50 %L   MID (cbc) 0.3 0 - 0.9   POC MID % 5.9 0 - 12 %M   POC Granulocyte 3.1 2 - 6.9   Granulocyte percent 56.2 37 - 80 %G   RBC 5.17 4.04 - 5.48 M/uL   Hemoglobin 13.5 12.2 - 16.2 g/dL   HCT, POC 96.0 45.4 - 47.9 %   MCV 80.7 80 - 97 fL   MCH, POC 26.1 (A) 27 - 31.2 pg   MCHC 32.4 31.8 - 35.4 g/dL   RDW, POC 09.8 %   Platelet Count, POC 310 142 - 424 K/uL   MPV 7.0 0 - 99.8 fL  POCT glucose (manual entry)  Result Value Ref Range   POC Glucose 113 (A) 70 - 99 mg/dl  POCT glycosylated hemoglobin (Hb A1C)  Result Value Ref Range   Hemoglobin A1C 6.0    EKG normal  Assessment and Plan: 49 year old female is here today for chief complaint of dizziness, abdominal pain, and chest pains. EKG appears normal.   -Cardiology consult appreciated at this time. -Sxs likely due to steroid injection.  Glucose within normal range.   -I have advised to continue the metformin -Given meclizine and advil for the headache. -She will rtc in 7 days, if her sxs do not resolve, sooner if worsens.  -Will check electrolyte, renal, and liver fxn at this time.   Headache, unspecified  headache type - Plan: POCT CBC, POCT glucose (manual entry), POCT glycosylated hemoglobin (Hb A1C), meclizine (ANTIVERT) 50 MG tablet, ibuprofen (ADVIL,MOTRIN) 600 MG tablet, COMPLETE METABOLIC PANEL WITH GFR  Dizziness - Plan: POCT glucose (manual entry), POCT glycosylated hemoglobin (Hb A1C), meclizine (ANTIVERT) 50 MG tablet, COMPLETE METABOLIC PANEL WITH GFR  Abdominal pain, unspecified abdominal location -  Plan: POCT CBC, POCT glucose (manual entry), POCT glycosylated hemoglobin (Hb A1C), ibuprofen (ADVIL,MOTRIN) 600 MG tablet  Chest pain, unspecified chest pain type - Plan: EKG 12-Lead, Ambulatory referral to Cardiology, COMPLETE METABOLIC PANEL WITH GFR   Trena Platt, PA-C [facility] 07/08/2015 8:25 AM

## 2015-07-08 NOTE — Patient Instructions (Signed)
Please take the ibuprofen with food.  Please take your reflux medication at this time. I will have the results of outgoing labs, in about 7 days. If you continue to have dizziness, and headache please let me know. I am referring you to cardiology for this chest pain where we can have a full work up of this. Likely the steroid injection is causing these symptoms, and should resolve within the next week.

## 2015-07-09 LAB — COMPLETE METABOLIC PANEL WITH GFR
ALBUMIN: 4.2 g/dL (ref 3.6–5.1)
ALT: 16 U/L (ref 6–29)
AST: 15 U/L (ref 10–35)
Alkaline Phosphatase: 100 U/L (ref 33–115)
BUN: 17 mg/dL (ref 7–25)
CHLORIDE: 102 mmol/L (ref 98–110)
CO2: 27 mmol/L (ref 20–31)
Calcium: 9.4 mg/dL (ref 8.6–10.2)
Creat: 0.54 mg/dL (ref 0.50–1.10)
GFR, Est African American: >89 mL/min (ref >=60)
GLUCOSE: 101 mg/dL — AB (ref 65–99)
POTASSIUM: 4.3 mmol/L (ref 3.5–5.3)
SODIUM: 138 mmol/L (ref 135–146)
Total Bilirubin: 0.5 mg/dL (ref 0.2–1.2)
Total Protein: 7.4 g/dL (ref 6.1–8.1)

## 2015-07-25 ENCOUNTER — Telehealth: Payer: Self-pay | Admitting: Physician Assistant

## 2015-07-25 NOTE — Telephone Encounter (Signed)
New Message ° °This message is to inform you that we have made 3 consecutive attempts to contact your patient. We were unsuccessful in these attempts and wanted you to be aware of our efforts. Will remove the patient from our referral work queue at this time ° °Shanti °[facility] °

## 2015-07-25 NOTE — Telephone Encounter (Signed)
Shanti, Thank you for informing us. Judeth Cornfield English, pa-c

## 2015-07-26 ENCOUNTER — Encounter: Payer: Self-pay | Admitting: Family Medicine

## 2015-07-28 ENCOUNTER — Encounter: Payer: Self-pay | Admitting: *Deleted

## 2015-08-30 ENCOUNTER — Other Ambulatory Visit: Payer: Self-pay

## 2015-08-30 DIAGNOSIS — Z1231 Encounter for screening mammogram for malignant neoplasm of breast: Secondary | ICD-10-CM

## 2015-09-21 ENCOUNTER — Ambulatory Visit
Admission: RE | Admit: 2015-09-21 | Discharge: 2015-09-21 | Disposition: A | Discharge status: Home / Self Care | Payer: BLUE CROSS/BLUE SHIELD | Source: Ambulatory Visit

## 2015-09-21 DIAGNOSIS — Z1231 Encounter for screening mammogram for malignant neoplasm of breast: Secondary | ICD-10-CM

## 2015-10-17 ENCOUNTER — Ambulatory Visit: Payer: BLUE CROSS/BLUE SHIELD | Attending: Family Medicine | Admitting: Family Medicine

## 2015-10-17 ENCOUNTER — Encounter: Payer: Self-pay | Admitting: Family Medicine

## 2015-10-17 VITALS — BP 124/82 | HR 73 | Temp 96.0°F | Resp 14 | Ht 63.5 in | Wt 193.4 lb

## 2015-10-17 DIAGNOSIS — M4806 Spinal stenosis, lumbar region: Secondary | ICD-10-CM

## 2015-10-17 DIAGNOSIS — E781 Pure hyperglyceridemia: Secondary | ICD-10-CM

## 2015-10-17 DIAGNOSIS — Z7984 Long term (current) use of oral hypoglycemic drugs: Secondary | ICD-10-CM | POA: Diagnosis not present

## 2015-10-17 DIAGNOSIS — R7303 Prediabetes: Secondary | ICD-10-CM | POA: Insufficient documentation

## 2015-10-17 DIAGNOSIS — M791 Myalgia: Secondary | ICD-10-CM | POA: Diagnosis not present

## 2015-10-17 DIAGNOSIS — M48062 Spinal stenosis, lumbar region with neurogenic claudication: Secondary | ICD-10-CM

## 2015-10-17 DIAGNOSIS — M609 Myositis, unspecified: Secondary | ICD-10-CM | POA: Diagnosis not present

## 2015-10-17 DIAGNOSIS — Z23 Encounter for immunization: Secondary | ICD-10-CM

## 2015-10-17 DIAGNOSIS — I739 Peripheral vascular disease, unspecified: Secondary | ICD-10-CM | POA: Diagnosis not present

## 2015-10-17 DIAGNOSIS — R42 Dizziness and giddiness: Secondary | ICD-10-CM | POA: Diagnosis not present

## 2015-10-17 DIAGNOSIS — K219 Gastro-esophageal reflux disease without esophagitis: Secondary | ICD-10-CM | POA: Diagnosis not present

## 2015-10-17 DIAGNOSIS — H9201 Otalgia, right ear: Secondary | ICD-10-CM | POA: Insufficient documentation

## 2015-10-17 DIAGNOSIS — E079 Disorder of thyroid, unspecified: Secondary | ICD-10-CM | POA: Diagnosis not present

## 2015-10-17 DIAGNOSIS — Z0000 Encounter for general adult medical examination without abnormal findings: Secondary | ICD-10-CM | POA: Insufficient documentation

## 2015-10-17 DIAGNOSIS — F419 Anxiety disorder, unspecified: Secondary | ICD-10-CM | POA: Diagnosis not present

## 2015-10-17 DIAGNOSIS — F329 Major depressive disorder, single episode, unspecified: Secondary | ICD-10-CM | POA: Diagnosis not present

## 2015-10-17 DIAGNOSIS — M5126 Other intervertebral disc displacement, lumbar region: Secondary | ICD-10-CM | POA: Diagnosis not present

## 2015-10-17 DIAGNOSIS — IMO0001 Reserved for inherently not codable concepts without codable children: Secondary | ICD-10-CM

## 2015-10-17 LAB — LIPID PANEL
Cholesterol: 141 mg/dL (ref 125–200)
HDL: 49 mg/dL (ref >=46)
LDL CALC: 63 mg/dL (ref <130)
Total CHOL/HDL Ratio: 2.9 Ratio (ref <=5.0)
Triglycerides: 144 mg/dL (ref <150)
VLDL: 29 mg/dL (ref <30)

## 2015-10-17 LAB — CBC WITH DIFFERENTIAL/PLATELET
BASOS ABS: 0 10*3/uL (ref 0.0–0.1)
Basophils Relative: 0 % (ref 0–1)
EOS PCT: 2 % (ref 0–5)
Eosinophils Absolute: 0.1 10*3/uL (ref 0.0–0.7)
HCT: 39.7 % (ref 36.0–46.0)
Hemoglobin: 13.5 g/dL (ref 12.0–15.0)
LYMPHS ABS: 2.4 10*3/uL (ref 0.7–4.0)
Lymphocytes Relative: 37 % (ref 12–46)
MCH: 27.1 pg (ref 26.0–34.0)
MCHC: 34 g/dL (ref 30.0–36.0)
MCV: 79.7 fL (ref 78.0–100.0)
MPV: 9.5 fL (ref 8.6–12.4)
Monocytes Absolute: 0.4 10*3/uL (ref 0.1–1.0)
Monocytes Relative: 7 % (ref 3–12)
Neutro Abs: 3.5 10*3/uL (ref 1.7–7.7)
Neutrophils Relative %: 54 % (ref 43–77)
PLATELETS: 331 10*3/uL (ref 150–400)
RBC: 4.98 MIL/uL (ref 3.87–5.11)
RDW: 13.2 % (ref 11.5–15.5)
WBC: 6.4 10*3/uL (ref 4.0–10.5)

## 2015-10-17 LAB — COMPLETE METABOLIC PANEL WITH GFR
ALT: 13 U/L (ref 6–29)
AST: 15 U/L (ref 10–35)
Albumin: 4.4 g/dL (ref 3.6–5.1)
Alkaline Phosphatase: 102 U/L (ref 33–130)
BUN: 17 mg/dL (ref 7–25)
CALCIUM: 9.8 mg/dL (ref 8.6–10.4)
CO2: 29 mmol/L (ref 20–31)
Chloride: 101 mmol/L (ref 98–110)
Creat: 0.58 mg/dL (ref 0.50–1.05)
GFR, Est African American: >89 mL/min (ref >=60)
GFR, Est Non African American: >89 mL/min (ref >=60)
Glucose, Bld: 94 mg/dL (ref 65–99)
Potassium: 5.2 mmol/L (ref 3.5–5.3)
Sodium: 137 mmol/L (ref 135–146)
TOTAL PROTEIN: 7.6 g/dL (ref 6.1–8.1)
Total Bilirubin: 0.5 mg/dL (ref 0.2–1.2)

## 2015-10-17 LAB — CK: Total CK: 36 U/L (ref 7–177)

## 2015-10-17 MED ORDER — MECLIZINE HCL 25 MG PO TABS: 25.0000 mg | ORAL_TABLET | Freq: Two times a day (BID) | ORAL | Status: DC | PRN

## 2015-10-17 NOTE — Progress Notes (Signed)
Subjective:    Patient ID: Kendra Rodriguez, female    DOB: 08-Dec-1965, 49 y.o.   MRN: [id number]  HPI 49 year old female with a history of prediabetes, spinal stenosis and disc herniation status post complete lumbar decompression L4-L5, central on microdiscectomy L4-L5 on the left. In 10/2014 who comes into the clinic with complains of bilateral leg weakness which she describes as "my legs feeling tired". She was previously being seen at [facility] and is s/p epidural injections to her back 3 months ago. Complains of 3-4 month history of dizziness worse when she rises from a seated position but denies the room spinning. Also endorses some right otalgia but no fever and no sinus symptoms or postnasal drain.  States her symptoms date back to when she was placed on her new medications 3 months ago-metformin and Crestor.  Past Medical History  Diagnosis Date  . Dizziness   . Acute sinusitis   . Leg pain, left     with swelling  . Thyroid disease     TOLD BLOOD LEVELS SLIGHTLY ABNORMAL - BUT NOT REQUIRED MEDICATION  . Anxiety   . Allergy   . Peripheral vascular disease (HCC)     HX OF TREATMENT FOR VARICOSE VEINS RIGHT LEG  . Ear ache     RIGHT --HX OF MULTIPLE EAR INFECTIONS AS A CHILD   . Depression     NO MEDICATIONS - DOING OK NOW  . GERD (gastroesophageal reflux disease)   . Headache(784.0)     MIGRAINES  . Lumbar stenosis     PAIN BACK AND LEFT LEG AND NUMBNESS LEG AND FOOT    Past Surgical History  Procedure Laterality Date  . Cholecystectomy    . Bladder lift      AT SAME TIME OF HYSTERECTOMY  . Abdominal hysterectomy    . Lumbar laminectomy/decompression microdiscectomy Left 10/24/2014    Procedure: COMPLETE LUMBAR DECOMPRESSION L4-L5 CENTRAL AND MICRODISCECTOMY L4-L5 LEFT;  Surgeon: Jacki Conesonald A Gioffre, MD;  Location: [facility];  Service: Orthopedics;  Laterality: Left;    No Known Allergies  Current Outpatient Prescriptions on File Prior to Visit  Medication  Sig Dispense Refill  . dexlansoprazole (DEXILANT) 60 MG capsule Take 60 mg by mouth every morning.     . Loratadine (CLARITIN PO) Take by mouth. AS NEEDED    . metFORMIN (GLUCOPHAGE) 850 MG tablet Take 850 mg by mouth 2 (two) times daily with a meal.    . ibuprofen (ADVIL,MOTRIN) 600 MG tablet Take 1 tablet (600 mg total) by mouth every 8 (eight) hours as needed. (Patient not taking: Reported on 10/17/2015) 30 tablet 0   No current facility-administered medications on file prior to visit.       Review of Systems  Constitutional: Negative for activity change, appetite change and fatigue.  HENT: Positive for ear pain. Negative for congestion, sinus pressure and sore throat.   Eyes: Negative for visual disturbance.  Respiratory: Negative for cough, chest tightness, shortness of breath and wheezing.   Cardiovascular: Negative for chest pain and palpitations.  Gastrointestinal: Negative for abdominal pain, constipation and abdominal distention.  Endocrine: Negative for polydipsia.  Genitourinary: Negative for dysuria and frequency.  Musculoskeletal:       See hpi  Skin: Negative for rash.  Neurological: Positive for light-headedness. Negative for tremors and numbness.  Hematological: Does not bruise/bleed easily.  Psychiatric/Behavioral: Negative for behavioral problems and agitation.       Objective: Filed Vitals:   10/17/15 0951  BP:  124/82  Pulse: 73  Temp: 96 F (35.6 C)  Resp: 14  Height: 5' 3.5" (1.613 m)  Weight: 193 lb 6.4 oz (87.726 kg)  SpO2: 96%      Physical Exam  Constitutional: She is oriented to person, place, and time. She appears well-developed and well-nourished.  Cardiovascular: Normal rate, normal heart sounds and intact distal pulses.   No murmur heard. Pulmonary/Chest: Effort normal and breath sounds normal. She has no wheezes. She has no rales. She exhibits no tenderness.  Abdominal: Soft. Bowel sounds are normal. She exhibits no distension and no  mass. There is no tenderness.  Musculoskeletal: Normal range of motion.  Neurological: She is alert and oriented to person, place, and time.  Skin: Skin is warm and dry.      Lab Results  Component Value Date   HGBA1C 6.0 07/08/2015    Lipid Panel     Component Value Date/Time   CHOL 198 03/29/2014 1616   TRIG 363* 03/29/2014 1616   HDL 46 03/29/2014 1616   CHOLHDL 4.3 03/29/2014 1616   VLDL 73* 03/29/2014 1616   LDLCALC 79 03/29/2014 1616        Assessment & Plan:  Prediabetes: Controlled with A1c of 6.0. She could probably be controlled on lifestyle modifications and ADA diet. I will hold off on discontinuing metformin until we are persuaded that she would be disciplined with her diet and exercise regimen.  Myalgia: This could be statin induced. I have discontinued Crestor and will send a full lipid panel today. If statin is indicated we could try low-dose pravastatin but if not Petrella 5. Could be indicated  Dizziness/vertigo: Her med list reveals meclizine which I will refill  Hyper triglyceridemia: For now she'll be diet controlled since statin has been discontinued  Spinal stenosis and disc herniation: Status post surgery a year ago. She gives a history of resolution of the neurogenic claudication post surgery; if other causes of leg weakness and pain have been excluded we might have to proceed with evaluation for spinal etiology.

## 2015-10-17 NOTE — Progress Notes (Signed)
Patient here to establish care Complains of bilateral leg/feet weakness She wonders if it due to her diagnosed spinal stenosis She reports no depression/anxiety,smoking, ETOH or drug use She will take a flu shot

## 2015-10-18 ENCOUNTER — Telehealth: Payer: Self-pay

## 2015-10-18 NOTE — Telephone Encounter (Signed)
CMA called [employer] and spoke to [place] # [phone]. Patient was left a message to return my call asap.

## 2015-10-18 NOTE — Telephone Encounter (Signed)
-----   Message from Jaclyn ShaggyEnobong Amao, MD sent at 10/18/2015  8:49 AM EST ----- Please inform the patient that labs are normal. Thank you.

## 2015-10-19 ENCOUNTER — Telehealth: Payer: Self-pay

## 2015-10-19 NOTE — Telephone Encounter (Signed)
CMA called [employer] and spoke with Reuel BoomDaniel [phone]. Interpreter called patient, but patient didn't answer. VM was left for patient to return my call asap.  Letter will be sent to patient address on file on 12/19 by the end of the business if patient doesn't return my call.

## 2015-10-19 NOTE — Telephone Encounter (Addendum)
Patient will be sent a letter to address on file by the end of business day on 12/19, if patient do not reply back.

## 2015-10-22 ENCOUNTER — Other Ambulatory Visit: Payer: Self-pay | Admitting: Family Medicine

## 2015-10-22 MED ORDER — FLUCONAZOLE 150 MG PO TABS: 150.0000 mg | ORAL_TABLET | Freq: Once | ORAL | Status: DC

## 2015-10-22 NOTE — Progress Notes (Signed)
Diflucan sent to pharmacy as per patient's request for medication for yeast infection.

## 2015-10-23 ENCOUNTER — Telehealth: Payer: Self-pay | Admitting: Family Medicine

## 2015-10-23 NOTE — Telephone Encounter (Signed)
CMA called [employer] spoke with Kendra Rodriguez [phone]. Interpreter left message for the patient to return my call.

## 2015-10-23 NOTE — Telephone Encounter (Signed)
Pt. Called and gave new phone number. Please f/u with pt.

## 2015-10-23 NOTE — Telephone Encounter (Signed)
Pt. Returned call. Please f/u with pt. °

## 2015-10-25 NOTE — Telephone Encounter (Signed)
Pt. Returned call. Please f/u with pt. °

## 2015-10-25 NOTE — Telephone Encounter (Signed)
Pt. Called stating that she took the medication that was prescribed to her two days ago and pt. Is still experiencing burning when she urinates. Please f/u with pt. For advise.

## 2015-10-30 ENCOUNTER — Ambulatory Visit: Payer: BLUE CROSS/BLUE SHIELD | Admitting: Family Medicine

## 2015-10-31 ENCOUNTER — Ambulatory Visit: Payer: BLUE CROSS/BLUE SHIELD | Attending: Family Medicine | Admitting: Family Medicine

## 2015-10-31 ENCOUNTER — Encounter: Payer: Self-pay | Admitting: Family Medicine

## 2015-10-31 VITALS — BP 127/82 | HR 69 | Temp 97.9°F | Resp 13 | Ht 63.5 in | Wt 191.8 lb

## 2015-10-31 DIAGNOSIS — E079 Disorder of thyroid, unspecified: Secondary | ICD-10-CM | POA: Diagnosis not present

## 2015-10-31 DIAGNOSIS — R7303 Prediabetes: Secondary | ICD-10-CM | POA: Diagnosis not present

## 2015-10-31 DIAGNOSIS — R300 Dysuria: Secondary | ICD-10-CM

## 2015-10-31 DIAGNOSIS — R531 Weakness: Secondary | ICD-10-CM | POA: Insufficient documentation

## 2015-10-31 DIAGNOSIS — F329 Major depressive disorder, single episode, unspecified: Secondary | ICD-10-CM | POA: Diagnosis not present

## 2015-10-31 DIAGNOSIS — IMO0001 Reserved for inherently not codable concepts without codable children: Secondary | ICD-10-CM

## 2015-10-31 DIAGNOSIS — K219 Gastro-esophageal reflux disease without esophagitis: Secondary | ICD-10-CM | POA: Insufficient documentation

## 2015-10-31 DIAGNOSIS — F419 Anxiety disorder, unspecified: Secondary | ICD-10-CM | POA: Diagnosis not present

## 2015-10-31 DIAGNOSIS — I739 Peripheral vascular disease, unspecified: Secondary | ICD-10-CM | POA: Diagnosis not present

## 2015-10-31 DIAGNOSIS — M609 Myositis, unspecified: Secondary | ICD-10-CM | POA: Diagnosis not present

## 2015-10-31 DIAGNOSIS — M791 Myalgia: Secondary | ICD-10-CM | POA: Insufficient documentation

## 2015-10-31 LAB — POCT URINALYSIS DIPSTICK
Bilirubin, UA: NEGATIVE
Glucose, UA: NEGATIVE
KETONES UA: NEGATIVE
Leukocytes, UA: NEGATIVE
Nitrite, UA: NEGATIVE
PROTEIN UA: NEGATIVE
RBC UA: NEGATIVE
Spec Grav, UA: 1.025
Urobilinogen, UA: 0.2
pH, UA: 6.5

## 2015-10-31 MED ORDER — CIPROFLOXACIN HCL 500 MG PO TABS: 500.0000 mg | ORAL_TABLET | Freq: Two times a day (BID) | ORAL | Status: DC

## 2015-10-31 NOTE — Telephone Encounter (Signed)
CMA called [employer] and spoke to [place]rist[place] [phone]. Interpreter verified name and DOB. Patient informed me that she was seen in our office today concerning the burning when she urinates. Patient was placed on another abx for her symptoms. Patient didn't have any further questions.

## 2015-10-31 NOTE — Progress Notes (Signed)
Patient reports burning when she urinates No other complaints today

## 2015-10-31 NOTE — Progress Notes (Signed)
Subjective:    Patient ID: Kendra Rodriguez, female    DOB: [birth year], 49 y.o.   MRN: [id number]  HPI  49 year old female with a history of prediabetes (A1c of 6.0) who comes into the clinic complaining of dysuria for the last 2 weeks and right flank pain and lower abdominal pain. She denies nausea, vomiting or fever and use an OTC medication with no relief. At her last visit she had complained of lower extremity weakness and pain for which she was taken off her statin and she reports improvement in symptoms.  Past Medical History  Diagnosis Date  . Dizziness   . Acute sinusitis   . Leg pain, left     with swelling  . Thyroid disease     TOLD BLOOD LEVELS SLIGHTLY ABNORMAL - BUT NOT REQUIRED MEDICATION  . Anxiety   . Allergy   . Peripheral vascular disease (HCC)     HX OF TREATMENT FOR VARICOSE VEINS RIGHT LEG  . Ear ache     RIGHT --HX OF MULTIPLE EAR INFECTIONS AS A CHILD   . Depression     NO MEDICATIONS - DOING OK NOW  . GERD (gastroesophageal reflux disease)   . Headache(784.0)     MIGRAINES  . Lumbar stenosis     PAIN BACK AND LEFT LEG AND NUMBNESS LEG AND FOOT    Past Surgical History  Procedure Laterality Date  . Cholecystectomy    . Bladder lift      AT SAME TIME OF HYSTERECTOMY  . Abdominal hysterectomy    . Lumbar laminectomy/decompression microdiscectomy Left 10/24/2014    Procedure: COMPLETE LUMBAR DECOMPRESSION L4-L5 CENTRAL AND MICRODISCECTOMY L4-L5 LEFT;  Surgeon: Jacki Cones, MD;  Location: [facility];  Service: Orthopedics;  Laterality: Left;    Social History   Social History  . Marital Status: Married    Spouse Name: N/A  . Number of Children: N/A  . Years of Education: N/A   Occupational History  . Not on file.   Social History Main Topics  . Smoking status: Never Smoker   . Smokeless tobacco: Never Used  . Alcohol Use: No  . Drug Use: No  . Sexual Activity: Yes   Other Topics Concern  . Not on file   Social History Narrative     No Known Allergies    Review of Systems  Constitutional: Negative for activity change and appetite change.  HENT: Negative for sinus pressure and sore throat.   Respiratory: Negative for chest tightness, shortness of breath and wheezing.   Cardiovascular: Positive for chest pain and palpitations.  Gastrointestinal: Negative for abdominal pain, constipation and abdominal distention.  Genitourinary:       See history of present illness  Musculoskeletal: Negative.   Psychiatric/Behavioral: Negative for behavioral problems and dysphoric mood.       Objective: Filed Vitals:   10/31/15 0924  BP: 127/82  Pulse: 69  Temp: 97.9 F (36.6 C)  Resp: 13  Height: 5' 3.5" (1.613 m)  Weight: 191 lb 12.8 oz (87 kg)  SpO2: 95%      Physical Exam  Constitutional: She is oriented to person, place, and time. She appears well-developed and well-nourished.  Cardiovascular: Normal rate, normal heart sounds and intact distal pulses.   No murmur heard. Pulmonary/Chest: Effort normal and breath sounds normal. She has no wheezes. She has no rales. She exhibits no tenderness.  Abdominal: Soft. Bowel sounds are normal. She exhibits no distension and no mass. There is no  tenderness.  Musculoskeletal: Normal range of motion. She exhibits tenderness.  Positive right CVA tenderness  Neurological: She is alert and oriented to person, place, and time.               Assessment & Plan:  Pre Diabetes: Hba1c of 6.0 D/c metformin Maintain dietary control and lifestyle modifications  Urinary symptoms: UA is negative for UTI probably because she used an OTC medication Placed on Cipro because she is symptomatic.  Myalgia: Improved since discontinuation of statin.  This note has been created with Education officer, [employer] speech recognition software and smart phrase technology. Any transcriptional errors are unintentional.

## 2015-10-31 NOTE — Patient Instructions (Signed)

## 2015-11-09 ENCOUNTER — Ambulatory Visit: Payer: BLUE CROSS/BLUE SHIELD | Admitting: Family Medicine

## 2015-11-23 ENCOUNTER — Ambulatory Visit (INDEPENDENT_AMBULATORY_CARE_PROVIDER_SITE_OTHER): Payer: BLUE CROSS/BLUE SHIELD

## 2015-11-23 ENCOUNTER — Ambulatory Visit (INDEPENDENT_AMBULATORY_CARE_PROVIDER_SITE_OTHER): Payer: BLUE CROSS/BLUE SHIELD | Admitting: Family Medicine

## 2015-11-23 VITALS — BP 116/76 | HR 60 | Temp 97.8°F | Resp 16 | Ht 62.75 in | Wt 191.6 lb

## 2015-11-23 DIAGNOSIS — R300 Dysuria: Secondary | ICD-10-CM

## 2015-11-23 DIAGNOSIS — R32 Unspecified urinary incontinence: Secondary | ICD-10-CM

## 2015-11-23 DIAGNOSIS — Z124 Encounter for screening for malignant neoplasm of cervix: Secondary | ICD-10-CM

## 2015-11-23 DIAGNOSIS — M25562 Pain in left knee: Secondary | ICD-10-CM | POA: Diagnosis not present

## 2015-11-23 DIAGNOSIS — M179 Osteoarthritis of knee, unspecified: Secondary | ICD-10-CM

## 2015-11-23 DIAGNOSIS — M1712 Unilateral primary osteoarthritis, left knee: Secondary | ICD-10-CM

## 2015-11-23 LAB — POCT URINALYSIS DIP (MANUAL ENTRY)
BILIRUBIN UA: NEGATIVE
Bilirubin, UA: NEGATIVE
Blood, UA: NEGATIVE
Glucose, UA: NEGATIVE
Leukocytes, UA: NEGATIVE
Nitrite, UA: NEGATIVE
PH UA: 7.5
Protein Ur, POC: NEGATIVE
SPEC GRAV UA: 1.02
Urobilinogen, UA: 1

## 2015-11-23 LAB — POCT WET + KOH PREP
TRICH BY WET PREP: ABSENT
YEAST BY WET PREP: ABSENT
Yeast by KOH: ABSENT

## 2015-11-23 LAB — POC MICROSCOPIC URINALYSIS (UMFC): Mucus: ABSENT

## 2015-11-23 MED ORDER — MELOXICAM 15 MG PO TABS: 15.0000 mg | ORAL_TABLET | Freq: Every day | ORAL | Status: DC | PRN
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
## 2015-11-23 NOTE — Patient Instructions (Addendum)
Ejercicios de Kegel  Licensed conveyancer) El objetivo de los ejercicios de Kegel es aislar y Company secretary los msculos del suelo plvico. Estos msculos actan como una hamaca que soporta el recto, la vagina, el intestino delgado y [place]. A medida que los msculos se debilitan, se hunden y esos rganos son desplazados de sus posiciones normales. Los ejercicios de Kegel fortalecen los msculos del suelo plvico y ayudan a Careers information officer control de la vejiga y del intestino, mejoran la respuesta sexual y Biomedical scientist a Lawyer problemas y Chartered certified accountant durante el [place]. Se pueden hacer en cualquier lugar y en cualquier momento.  CMO REALIZAR LOS EJERCICIOS DE KEGEL  1. Localice los msculos del suelo plvico. Para ello apriete (contraiga) los msculos que se utilizan para tratar de TEFL teacher el flujo de [place]. Usted se sentir una opresin en el rea vaginal (mujeres) y que se eleva y comprime la zona rectal (hombres y mujeres). 2. Para comenzar, contraiga los msculos plvicos durante 2 a 5 segundos y despus reljelos durante 2 a 5 segundos. Esta es una serie. Repita 4 a 5 series con una breve pausa en el medio. 3. Contraiga los msculos de la pelvis durante 8 a 10 segundos y despus reljelos durante 8 a 10 segundos. Repita 4 a 5 series. Si no puede contraer los msculos de la pelvis durante 8 a 10 segundos, trate de 5 a 7 segundos y Scientist, research (physical sciences) 8 a 10 segundos. Su objetivo es Education officer, environmental 4 a 5 series de 10 Advice worker. Mantenga el estmago, las nalgas y las piernas relajadas durante los ejercicios. Realice ambas series de contracciones, cortas y largas. Vare las posiciones. Haga las contracciones 3 a 4 veces por Futures trader. Haga las series mientras est:    Acostado en la cama por la [place].  De pie en el almuerzo.  Sentado por las tardes.  Acostado en la cama por la noche.  Debe hacer 40 a 50 contracciones por da. No realice ms ejercicios de Kegel por da que lo recomendado. El  exceso de ejercicio puede causar fatiga muscular. Contine con estos ejercicios durante al menos 15 a 20 semanas o segn las indicaciones de su mdico.    Esta informacin no tiene Theme park manager el consejo del mdico. Asegrese de hacerle al mdico cualquier pregunta que tenga.   Document Released: 10/06/2012 Elsevier Interactive Patient Education 2016 [employer].  Dolor de rodilla (Knee Pain) El dolor de rodilla es un sntoma muy comn y puede tener muchas causas. Suele desaparecer cuando se siguen las indicaciones del mdico en lo que respecta a Engineer, materials y las molestias en la casa. Sin embargo, puede Scientist, product/process development en una afeccin que requiere [place]. Algunas afecciones pueden incluir lo siguiente: 4. Artritis por uso y Chiropractor (artrosis). 5. Artritis por hinchazn e irritacin (artritis reumatoide o gota). 6. Un quiste o un crecimiento en la rodilla. 7. Una infeccin en la articulacin de la rodilla. 8. Una lesin que no se cura. 9. Dao, hinchazn o irritacin de los tejidos que sostienen la rodilla (distensin de ligamentos o tendinitis). Si el dolor de Paramedic, tal vez haya que realizar ms estudios para Scientist, forensic, los cuales pueden incluir radiografas u otros estudios de diagnstico por imgenes de la rodilla. Adems, es posible que haya que extraerle lquido de la rodilla. El tratamiento del dolor continuo de rodilla depende de la causa, pero puede incluir lo siguiente:  Medicamentos para Engineer, materials o reducir la hinchazn.  Inyecciones de corticoides en la rodilla.  Fisioterapia.  Ciruga. INSTRUCCIONES PARA EL CUIDADO EN EL HOGAR  Tome los medicamentos solamente como se lo haya indicado el mdico.  Mantenga la rodilla en reposo y en alto (elevada) mientras est descansando.  No haga cosas que le causen dolor o que lo intensifiquen.  Evite las actividades o los ejercicios de alto impacto, como correr, Merchandiser, retail soga o hacer saltos de tijera.  Aplique hielo sobre la zona de la rodilla:  Ponga el hielo en una bolsa plstica.  Coloque una toalla entre la piel y la bolsa de hielo.  Coloque el hielo durante 20 minutos, 2 a 3 veces por da.  Pregntele al mdico si debe usar una Neurosurgeon.  Cuando duerma, pngase una almohada debajo de la rodilla.  Baje de peso si es necesario. [place] extra puede generar presin en la rodilla.  No consuma ningn producto que contenga tabaco, lo que incluye cigarrillos, tabaco de Theatre manager o Administrator, Civil Service. Si necesita ayuda para dejar de fumar, consulte al mdico. Fumar puede retrasar la curacin de cualquier problema que tenga en el hueso y Nurse, learning disability. SOLICITE ATENCIN MDICA SI:  El dolor de rodilla contina, [place].  Tiene fiebre junto con dolor de rodilla.  La rodilla se le tuerce o se le traba.  La rodilla est ms hinchada. SOLICITE ATENCIN MDICA DE INMEDIATO SI:   La articulacin de la rodilla est caliente al tacto.  Tiene dolor en el pecho o dificultad para respirar.   Esta informacin no tiene Theme park manager el consejo del mdico. Asegrese de hacerle al mdico cualquier pregunta que tenga.   Document Released: 04/07/2008 Document Revised: 11/10/2014 Elsevier Interactive Patient Education 2016 [employer]. Osteoartritis (Osteoarthritis) La osteoartritis es una enfermedad que provoca dolor e inflamacin en las articulaciones. Ocurre cuando el cartlago de la articulacin afectada se desgasta. El cartlago acta como una almohadilla que cubre los extremos de los huesos que forman una articulacin. La osteoartritis es la ms frecuente de reumatismo articular. Afecta a menudo a los ancianos. Las articulaciones que se ven ms afectadas por esta afeccin son las que se encuentran en las siguientes zonas: 10. Los extremos de los dedos. 11. Los pulgares. 12. El cuello. 13. La parte inferior de la  espalda. 14. Las rodillas. 15. Las caderas CAUSAS  Con el paso del [place], el cartlago que recubre los extremos de los huesos comienza a Acupuncturist. Esto provoca friccin [employer], lo que causa dolor y entumecimiento en las articulaciones afectadas.  FACTORES DE RIESGO Ciertos factores pueden aumentar las probabilidades de padecer osteoartritis, incluidos los siguientes:  Edad avanzada.  Exceso de Runner, broadcasting/film/video.  Uso excesivo de la articulacin.  Lesin previa en la articulacin. SIGNOS Y SNTOMAS   Dolor, hinchazn y entumecimiento en la articulacin.  Con el tiempo, la articulacin pierde su forma normal.  Pueden formarse pequeos depsitos de hueso (ostefitos) en los extremos de Nurse, learning disability.  Algunos trozos de [employer] o cartlago pueden separarse y flotar dentro del espacio de la articulacin. Esto puede causar ms dolor y lesiones. DIAGNSTICO  El mdico le preguntar acerca de sus sntomas y le har un examen fsico. Le indicarn varios estudios, como:  Radiografas de Statistician.  Anlisis de sangre para descartar otros tipos de artritis. Pueden usarse pruebas adicionales para diagnosticar la enfermedad. TRATAMIENTO  Los [employer] del tratamiento son Human resources officer y mejorar el funcionamiento de Nurse, learning disability. Los planes de tratamiento pueden incluir lo  siguiente:  Un programa de ejercicios recomendado que permita el descanso y el alivio de la articulacin.  Un plan de control del peso.  Tcnicas de [employer], como las siguientes:  Aplicacin correcta de fro y Airline pilot.  Impulsos elctricos enviados a las terminaciones nerviosas que se encuentran debajo de la piel (neuroestimulacin elctrica transcutnea [TENS]).  Masajes.  Ciertos suplementos nutricionales.  Medicamentos para Human resources officer como:  Paracetamol.  Antiinflamatorios no esteroides (AINE), como el naproxeno.  Narcticos o agentes de accin central, como  el tramadol.  Corticoides. Estos se pueden administrar por va oral o mediante una inyeccin.  Ciruga para reposicionar los [place] y Engineer, materials (osteotoma) o para retirar las piezas sueltas de hueso y TEFL teacher. Puede ser necesario el reemplazo de las articulaciones en estadios avanzados de la enfermedad. INSTRUCCIONES PARA EL CUIDADO EN EL HOGAR   Tome los medicamentos solamente como se lo haya indicado el mdico.  Mantenga un peso saludable. Siga las instrucciones del mdico con respecto al control del [place]. Esto puede incluir instrucciones Software engineer.  Practique los ejercicios que le indiquen. Es posible que el mdico le recomiende tipos especficos de ejercicios. Estos pueden incluir los siguientes:  Ejercicios de fortalecimiento Se realizan para fortalecer los msculos que sostienen las articulaciones afectadas por la artritis. Pueden realizarse con peso o con bandas para agregar resistencia.  Actividades Rhona Raider. Son Programmer, applications a paso ligero, gimnasia [place] de bajo impacto, que acelere el corazn.  Actividades de amplitud de movimientos. Dan agilidad a las articulaciones.  Ejercicios de equilibrio y [place]. Ayudan a [employer] se necesitan para la vida diaria.  Haga descansar a las articulaciones segn las indicaciones del mdico.  Concurra a todas las visitas de control como se lo haya indicado el mdico. SOLICITE ATENCIN MDICA SI:   La piel se pone roja.  Aparece una erupcin adems del dolor en la articulacin.  El dolor en la articulacin empeora.  Tiene fiebre y siente dolor en la articulacin o el msculo. SOLICITE ATENCIN MDICA DE INMEDIATO SI:  Nota una prdida importante de peso o del apetito.  Tiene transpiracin nocturna. PARA Parthenia Ames MS INFORMACIN   [employer] de Artritis y Event organiser Musculoesquelticas y Dermatolgicas [facility] of Arthritis and Musculoskeletal and Skin Diseases):  www.niams.http://www.myers.net/.  Instituto [employer] el Envejecimiento ([employer] on Aging): https://walker.com/.  Instit[facility] de Advice worker of Rheumatology): www.rheumatology.org.   Esta informacin no tiene como fin reemplazar el consejo del mdico. Asegrese de hacerle al mdico cualquier pregunta que tenga.   Document Released: 07/30/2005 Document Revised: 11/10/2014 Elsevier Interactive Patient Education 2016 [employer]. Josem Kaufmann urinaria (Urinary Incontinence) La incontinencia urinaria es la prdida involuntaria de orina de la vejiga. CAUSAS  Hay muchas causas de incontinencia urinaria. Ellas son: 9. Medicamentos. 17. Infecciones. 18. Agrandamiento prosttico que causa un aumento del flujo de [place] de la vejiga. 19. Ciruga. 20. Enfermedades neurolgicas. 21. Factores emocionales. SIGNOS Y SNTOMAS Hay cuatro tipos de incontinencia urinaria:  Incontinencia urinaria de urgencia: es la prdida involuntaria de orina antes de llegar al bao. Hay una urgencia repentina de orinar pero no llega a tiempo al bao.  Incontinencia por estrs: es la prdida repentina de orina con cualquier actividad que fuerza a pasar orina. La causa ms frecuente son los cambios anatmicos en la pelvis y las zonas del esfnter.  Incontinencia por rebosamiento: es la prdida de orina por una obstruccin de la apertura de la vejiga. Esto causa un retroceso de  la [place] y [place] acumulacin de presin dentro de la vejiga. Cuando la presin dentro de la vejiga excede la presin que Research officer, political party, la orina rebalsa y causa incontinencia, similar al desbordamiento de un dique.  Incontinencia total: es la prdida de orina como resultado de la incapacidad de Academic librarian orina dentro de la vejiga. DIAGNSTICO  La evaluacin de la causa puede requerir:  Neomia Dear historia mdica y obsttrica exhaustiva y [place].  Examen fsico completo.  Anlisis de laboratorio como un  urocultivo y [place]. Cuando se indican estudios adicionales, pueden incluirse:  Una ecografa.  Radiografas de vejiga y riones.  Una cistoscopa. Consiste en un examen de la vejiga utilizando un telescopio pequeo.  Estudios urodinmicos para comprobar la funcin nerviosa de la vejiga y Programmer, multimedia. TRATAMIENTO  El tratamiento de la incontinencia urinaria depende de la causa:  Para la incontinencia urinaria causada por una infeccin urinaria, le indicarn antibiticos. Si la incontinencia urinaria se relaciona con los [employer] toma, el mdico podr Estate agent.  Para la incontinencia por estrs, en necesaria una ciruga para restablecer el soporte anatmico de la vejiga o el esfnter, o ambos, lo que Designer, jewellery.  Para la incontinencia por rebosamiento causada por una prstata agrandada, una operacin para abrir el canal a travs de la prstata agrandada permitir que el flujo de orina salga de la vejiga. En las mujeres con fibromas, ser necesaria una histerectoma.  Para la incontinencia total, una ciruga del esfnter podr ayudar. Puede ser necesario colocar un esfnter urinario artificial (se coloca un manguito inflable alrededor de Engineer, mining). En las mujeres que hayan desarrollado un pasaje similar a un orificio entre la vejiga y la vagina (fstula vesicovaginal ), ser necesario realizar una ciruga para cerrar la fstula. INSTRUCCIONES PARA EL CUIDADO EN EL HOGAR  La higiene diaria normal y [address] regular de apsitos o paales para adultos cambiados con regularidad ayudan a prevenir olores y daos en la piel.  Evite la cafena. Puede sobreestimular la vejiga.  Use el bao con regularidad. Trate de ir al bao cada 2  3 horas, aunque no sienta la necesidad. Tmese el tiempo para vaciar la vejiga completamente. Despus de orinar, espere un minuto. Luego trate de orinar nuevamente.  Para las causas que implican una disfuncin nerviosa, lleve un  registro de los medicamentos que toma y un diario de las veces que va al bao. SOLICITE ATENCIN MDICA SI:  Despus del procedimiento, experimenta un empeoramiento del dolor en vez de mejorar.  La incontinencia empeora en vez de mejorar. SOLICITE ATENCIN MDICA DE INMEDIATO SI:  Le sube la fiebre o tiene escalofros.  No puede orinar.  Observa irritacin en la ingle o baja hacia los muslos. ASEGRESE DE QUE:   Comprende estas instrucciones.   Controlar su afeccin.  Recibir ayuda de inmediato si no mejora o si empeora.   Esta informacin no tiene Theme park manager el consejo del mdico. Asegrese de hacerle al mdico cualquier pregunta que tenga.   Document Released: 10/20/2005 Document Revised: 11/10/2014 Elsevier Interactive Patient Education [employer]

## 2015-11-29 LAB — PAP IG, CT-NG NAA, HPV HIGH-RISK
Chlamydia Probe Amp: NOT DETECTED
GC Probe Amp: NOT DETECTED
HPV DNA HIGH RISK: NOT DETECTED

## 2015-11-30 ENCOUNTER — Encounter: Payer: Self-pay | Admitting: Family Medicine

## 2016-02-12 DIAGNOSIS — R140 Abdominal distension (gaseous): Secondary | ICD-10-CM | POA: Diagnosis not present

## 2016-02-12 DIAGNOSIS — K219 Gastro-esophageal reflux disease without esophagitis: Secondary | ICD-10-CM | POA: Diagnosis not present

## 2016-02-12 DIAGNOSIS — R079 Chest pain, unspecified: Secondary | ICD-10-CM | POA: Diagnosis not present

## 2016-02-26 DIAGNOSIS — K219 Gastro-esophageal reflux disease without esophagitis: Secondary | ICD-10-CM | POA: Diagnosis not present

## 2016-02-26 DIAGNOSIS — E669 Obesity, unspecified: Secondary | ICD-10-CM | POA: Diagnosis not present

## 2016-02-26 DIAGNOSIS — R1011 Right upper quadrant pain: Secondary | ICD-10-CM | POA: Diagnosis not present

## 2016-02-26 DIAGNOSIS — R1013 Epigastric pain: Secondary | ICD-10-CM | POA: Diagnosis not present

## 2016-03-21 ENCOUNTER — Ambulatory Visit (INDEPENDENT_AMBULATORY_CARE_PROVIDER_SITE_OTHER): Payer: BLUE CROSS/BLUE SHIELD | Admitting: Physician Assistant

## 2016-03-21 VITALS — BP 120/82 | HR 76 | Temp 98.0°F | Resp 18 | Ht 62.75 in | Wt 191.0 lb

## 2016-03-21 DIAGNOSIS — J0110 Acute frontal sinusitis, unspecified: Secondary | ICD-10-CM | POA: Diagnosis not present

## 2016-03-21 IMAGING — CR DG KNEE 1-2V*L*
2 series · 2 of 2 positions shown · non-contrast
Comparison: Lower leg radiographs 11/20/2005.

CLINICAL DATA: Knee pain of unknown origin.

EXAM:
LEFT KNEE - 1-2 VIEW

[AP]
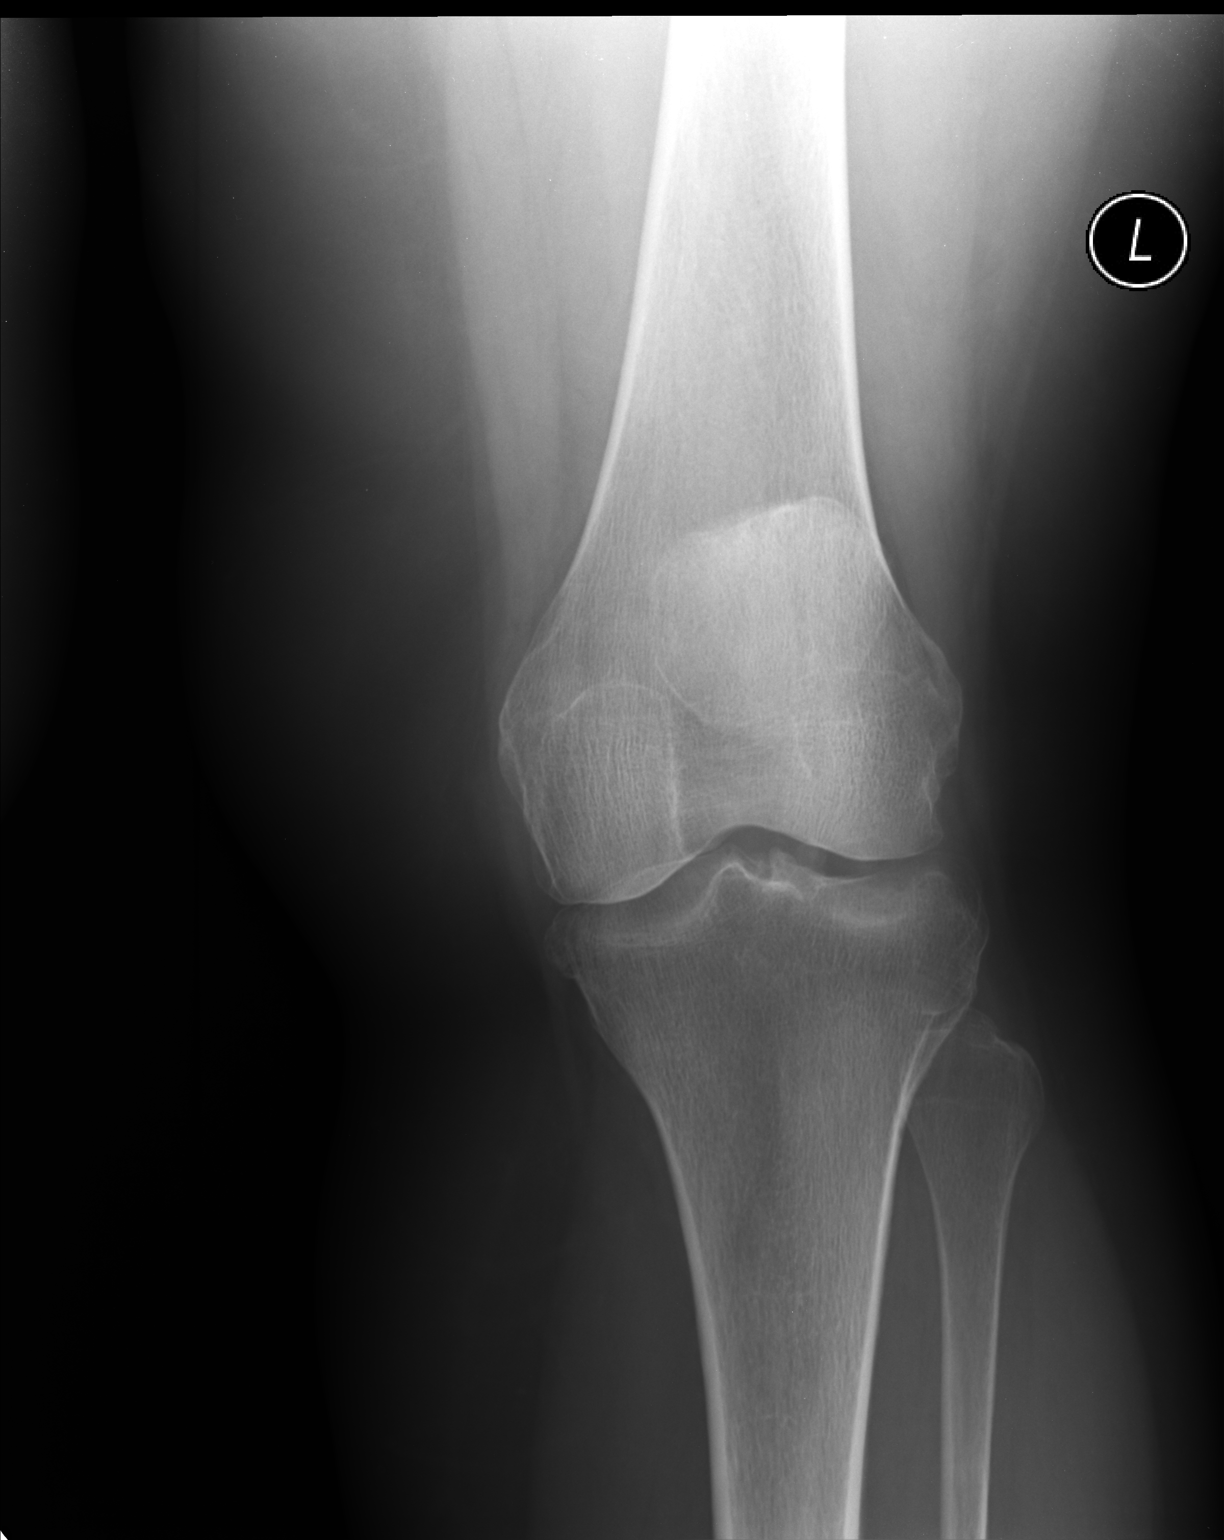

[lateral]
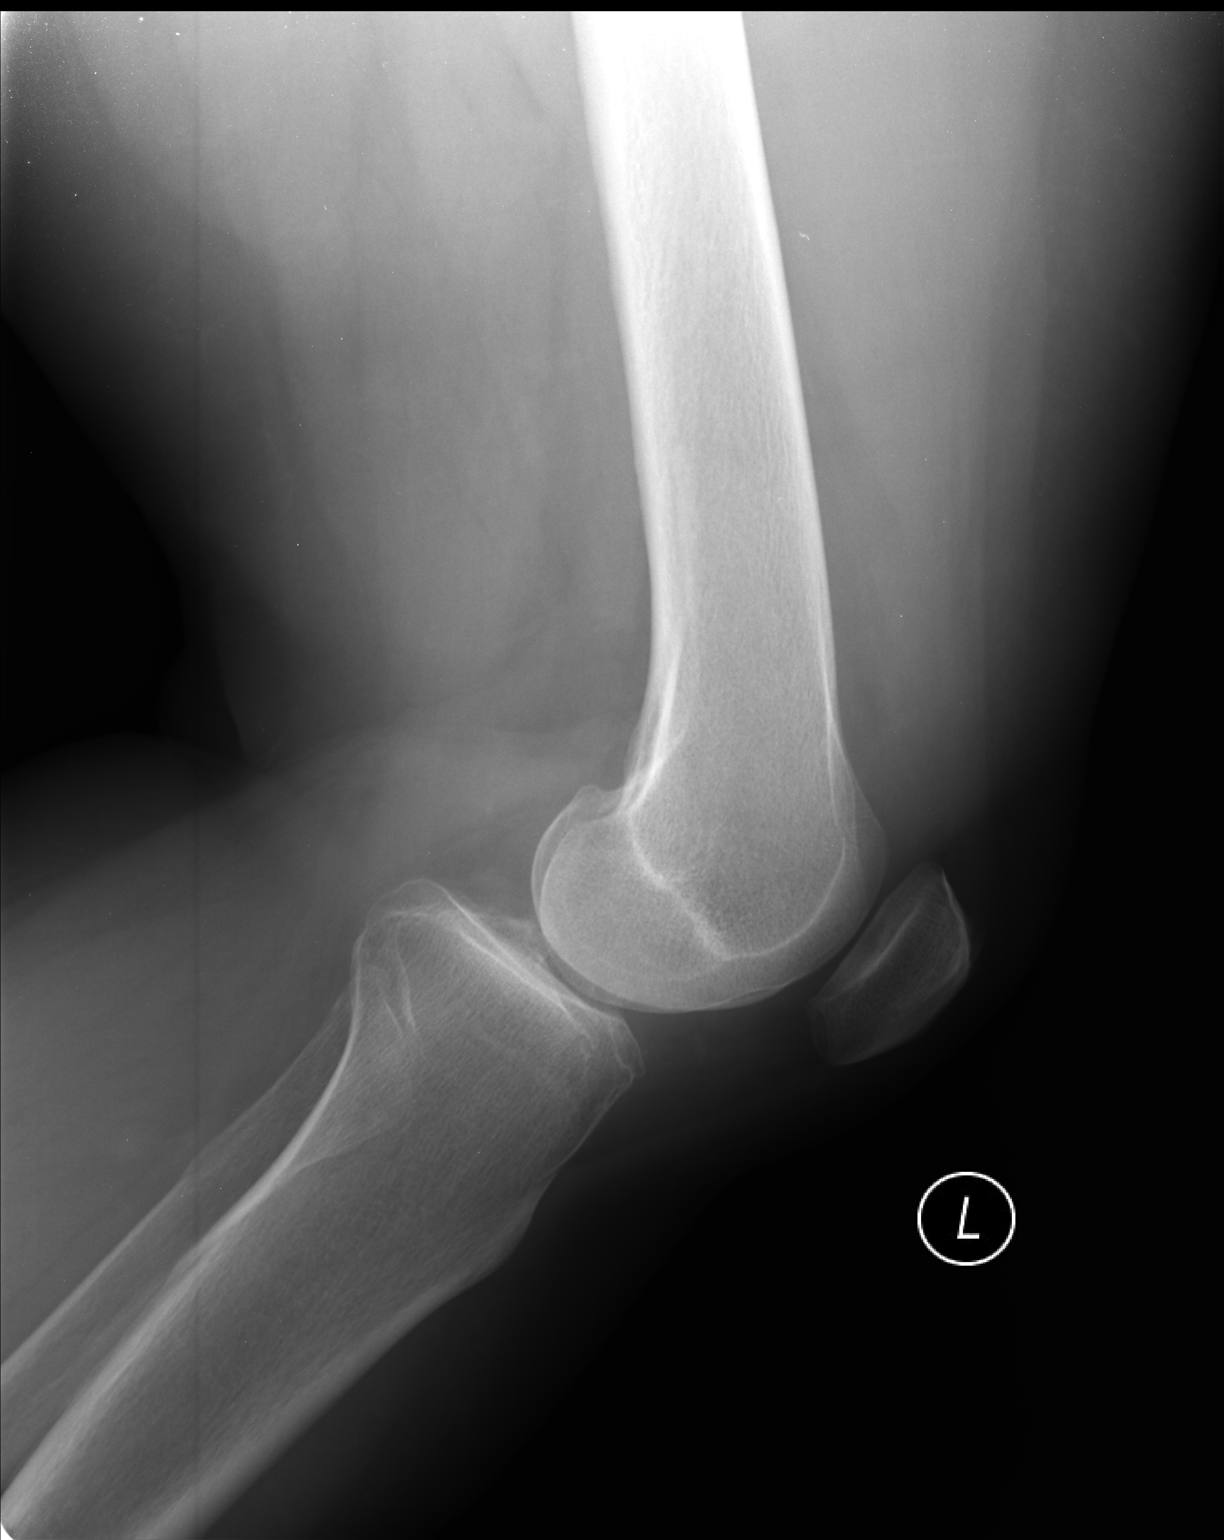

[2 of 2 positions shown; findings below may reference images not displayed]

FINDINGS: The mineralization and alignment are normal. The patient has
developed medial and lateral joint space loss with osteophytes.
There is a possible central loose body. Patellofemoral joint space
is maintained. No evidence of acute fracture, dislocation or
significant joint effusion.
IMPRESSION: Osteoarthritis with potential central loose body. No acute osseous
findings.

## 2016-03-21 MED ORDER — FLUTICASONE PROPIONATE 50 MCG/ACT NA SUSP: 2.0000 | Freq: Every day | NASAL | Status: DC

## 2016-03-21 MED ORDER — AMOXICILLIN-POT CLAVULANATE 875-125 MG PO TABS: 1.0000 | ORAL_TABLET | Freq: Two times a day (BID) | ORAL | Status: DC

## 2016-03-21 MED ORDER — PSEUDOEPHEDRINE HCL 60 MG PO TABS: 60.0000 mg | ORAL_TABLET | ORAL | Status: DC | PRN

## 2016-03-21 NOTE — Patient Instructions (Addendum)
Please take the sudafed 2-3 times daily as needed for congestion. Please use the flonase 2 sprays in each nostril for the next several weeks. Continue to take your zyrtec. If you're not feeling better by Monday please take the antibiotic, twice daily for 10 days.  You may also have migraines, picking up excedrin migraine to take while you have migraine symptoms may be helpful.   Sinusitis, Adult Sinusitis is redness, soreness, and inflammation of the paranasal sinuses. Paranasal sinuses are air pockets within the bones of your face. They are located beneath your eyes, in the middle of your forehead, and above your eyes. In healthy paranasal sinuses, mucus is able to drain out, and air is able to circulate through them by way of your nose. However, when your paranasal sinuses are inflamed, mucus and air can become trapped. This can allow bacteria and other germs to grow and cause infection. Sinusitis can develop quickly and last only a short time (acute) or continue over a long period (chronic). Sinusitis that lasts for more than 12 weeks is considered chronic. CAUSES Causes of sinusitis include:  Allergies.  Structural abnormalities, such as displacement of the cartilage that separates your nostrils (deviated septum), which can decrease the air flow through your nose and sinuses and affect sinus drainage.  Functional abnormalities, such as when the small hairs (cilia) that line your sinuses and help remove mucus do not work properly or are not present. SIGNS AND SYMPTOMS Symptoms of acute and chronic sinusitis are the same. The primary symptoms are pain and pressure around the affected sinuses. Other symptoms include:  Upper toothache.  Earache.  Headache.  Bad breath.  Decreased sense of smell and taste.  A cough, which worsens when you are lying flat.  Fatigue.  Fever.  Thick drainage from your nose, which often is green and may contain pus (purulent).  Swelling and warmth  over the affected sinuses. DIAGNOSIS Your health care provider will perform a physical exam. During your exam, your health care provider may perform any of the following to help determine if you have acute sinusitis or chronic sinusitis:  Look in your nose for signs of abnormal growths in your nostrils (nasal polyps).  Tap over the affected sinus to check for signs of infection.  View the inside of your sinuses using an imaging device that has a light attached (endoscope). If your health care provider suspects that you have chronic sinusitis, one or more of the following tests may be recommended:  Allergy tests.  Nasal culture. A sample of mucus is taken from your nose, sent to a lab, and screened for bacteria.  Nasal cytology. A sample of mucus is taken from your nose and examined by your health care provider to determine if your sinusitis is related to an allergy. TREATMENT Most cases of acute sinusitis are related to a viral infection and will resolve on their own within 10 days. Sometimes, medicines are prescribed to help relieve symptoms of both acute and chronic sinusitis. These may include pain medicines, decongestants, nasal steroid sprays, or saline sprays. However, for sinusitis related to a bacterial infection, your health care provider will prescribe antibiotic medicines. These are medicines that will help kill the bacteria causing the infection. Rarely, sinusitis is caused by a fungal infection. In these cases, your health care provider will prescribe antifungal medicine. For some cases of chronic sinusitis, surgery is needed. Generally, these are cases in which sinusitis recurs more than 3 times per year, despite other treatments. HOME  CARE INSTRUCTIONS  Drink plenty of water. Water helps thin the mucus so your sinuses can drain more easily.  Use a humidifier.  Inhale steam 3-4 times a day (for example, sit in the bathroom with the shower running).  Apply a warm, moist  washcloth to your face 3-4 times a day, or as directed by your health care provider.  Use saline nasal sprays to help moisten and clean your sinuses.  Take medicines only as directed by your health care provider.  If you were prescribed either an antibiotic or antifungal medicine, finish it all even if you start to feel better. SEEK IMMEDIATE MEDICAL CARE IF:  You have increasing pain or severe headaches.  You have nausea, vomiting, or drowsiness.  You have swelling around your face.  You have vision problems.  You have a stiff neck.  You have difficulty breathing.   This information is not intended to replace advice given to you by your health care provider. Make sure you discuss any questions you have with your health care provider.   Document Released: 10/20/2005 Document Revised: 11/10/2014 Document Reviewed: 11/04/2011 Elsevier Interactive Patient Education [employer]

## 2016-03-21 NOTE — Progress Notes (Signed)
   Subjective:    Patient ID: Kendra Rodriguez, female    DOB: Mar 08, 1966, 50 y.o.   MRN: [id number]  Chief Complaint  Patient presents with  . Sinusitis  . Headache  . Fatigue  . Dizziness   Medications, allergies, past medical history, surgical history, family history, social history and problem list reviewed and updated.  HPI  [age] yof presents with above complaints.   Symptoms intermittent over past 8-9 days. Head/nasal congestion. Right nasal rhinorrhea. Ears feeling congested. Feeling warm but no fevers. Having chills past few days. Intermittent right sided and top of head HAs over past week. Sometimes assoc with position. Currently without HA but feeling head congestion/eye pressure/and fatigue. Slight dizziness past few days.   Admits to hx HAs made worse with photophobia. None recently. Sometimes has aura with HAs but none recently.   Review of Systems No cp, sob, abd pain, n/v, diarrhea.     Objective:   Physical Exam  Constitutional: She appears well-developed and well-nourished.  Non-toxic appearance. She does not have a sickly appearance. She does not appear ill. No distress.  BP 120/82 mmHg  Pulse 76  Temp(Src) 98 F (36.7 C) (Oral)  Resp 18  Ht 5' 2.75" (1.594 m)  Wt 191 lb (86.637 kg)  BMI 34.10 kg/m2  SpO2 97%   HENT:  Right Ear: A middle ear effusion is present.  Left Ear: Tympanic membrane normal.  Nose: Mucosal edema and rhinorrhea present. Right sinus exhibits maxillary sinus tenderness. Right sinus exhibits no frontal sinus tenderness. Left sinus exhibits maxillary sinus tenderness. Left sinus exhibits no frontal sinus tenderness.  Mouth/Throat: Uvula is midline, oropharynx is clear and moist and mucous membranes are normal.  Pulmonary/Chest: Effort normal and breath sounds normal. No tachypnea.  Lymphadenopathy:       Head (right side): No submental, no submandibular and no tonsillar adenopathy present.       Head (left side): No submental, no  submandibular and no tonsillar adenopathy present.    She has no cervical adenopathy.      Assessment & Plan:   Acute frontal sinusitis, recurrence not specified - Plan: amoxicillin-clavulanate (AUGMENTIN) 875-125 MG tablet, pseudoephedrine (SUDAFED) 60 MG tablet, fluticasone (FLONASE) 50 MCG/ACT nasal spray --suspect sinusitis secondary to allergic rhinitis --continue zyrtec, start flonase and sudafed --script given for augmentin if symptoms not improved by early next week or if symptoms worsen --encouraged to try excedrin migraine if has return of migraine like symptoms   Donnajean Lopesodd M. Mavery Milling, PA-C Physician Assistant-Certified [facility]  03/21/2016 4:47 PM

## 2016-05-29 ENCOUNTER — Ambulatory Visit (INDEPENDENT_AMBULATORY_CARE_PROVIDER_SITE_OTHER): Payer: BLUE CROSS/BLUE SHIELD | Admitting: Urgent Care

## 2016-05-29 VITALS — BP 118/78 | HR 63 | Temp 98.4°F | Resp 17 | Ht 62.75 in | Wt 189.0 lb

## 2016-05-29 DIAGNOSIS — J309 Allergic rhinitis, unspecified: Secondary | ICD-10-CM

## 2016-05-29 DIAGNOSIS — R350 Frequency of micturition: Secondary | ICD-10-CM

## 2016-05-29 DIAGNOSIS — J019 Acute sinusitis, unspecified: Secondary | ICD-10-CM | POA: Diagnosis not present

## 2016-05-29 DIAGNOSIS — R300 Dysuria: Secondary | ICD-10-CM | POA: Diagnosis not present

## 2016-05-29 DIAGNOSIS — H6983 Other specified disorders of Eustachian tube, bilateral: Secondary | ICD-10-CM

## 2016-05-29 DIAGNOSIS — R42 Dizziness and giddiness: Secondary | ICD-10-CM | POA: Diagnosis not present

## 2016-05-29 LAB — POCT URINALYSIS DIP (MANUAL ENTRY)
BILIRUBIN UA: NEGATIVE
BILIRUBIN UA: NEGATIVE
Blood, UA: NEGATIVE
Glucose, UA: NEGATIVE
LEUKOCYTES UA: NEGATIVE
Nitrite, UA: NEGATIVE
PH UA: 7.5
Protein Ur, POC: NEGATIVE
Spec Grav, UA: 1.015
Urobilinogen, UA: 0.2

## 2016-05-29 LAB — POCT CBC
GRANULOCYTE PERCENT: 59 % (ref 37–80)
HCT, POC: 38.8 % (ref 37.7–47.9)
Hemoglobin: 13.4 g/dL (ref 12.2–16.2)
Lymph, poc: 1.7 (ref 0.6–3.4)
MCH: 27.9 pg (ref 27–31.2)
MCHC: 34.7 g/dL (ref 31.8–35.4)
MCV: 80.6 fL (ref 80–97)
MID (CBC): 0.3 (ref 0–0.9)
MPV: 7.4 fL (ref 0–99.8)
PLATELET COUNT, POC: 256 10*3/uL (ref 142–424)
POC GRANULOCYTE: 2.8 (ref 2–6.9)
POC LYMPH %: 35.4 % (ref 10–50)
POC MID %: 5.6 % (ref 0–12)
RBC: 4.81 M/uL (ref 4.04–5.48)
RDW, POC: 12.6 %
WBC: 4.7 10*3/uL (ref 4.6–10.2)

## 2016-05-29 LAB — POCT GLYCOSYLATED HEMOGLOBIN (HGB A1C): HEMOGLOBIN A1C: 6

## 2016-05-29 LAB — POC MICROSCOPIC URINALYSIS (UMFC): MUCUS RE: ABSENT

## 2016-05-29 MED ORDER — CETIRIZINE HCL 10 MG PO TABS: 10.0000 mg | ORAL_TABLET | Freq: Every day | ORAL | 11 refills | Status: DC

## 2016-05-29 MED ORDER — AMOXICILLIN 875 MG PO TABS: 875.0000 mg | ORAL_TABLET | Freq: Two times a day (BID) | ORAL | 0 refills | Status: DC

## 2016-05-29 MED ORDER — MONTELUKAST SODIUM 10 MG PO TABS: 10.0000 mg | ORAL_TABLET | Freq: Every day | ORAL | 3 refills | Status: DC

## 2016-05-29 NOTE — Progress Notes (Signed)
MRN: [id number] DOB: [birth year]  Subjective:   Kendra Rodriguez is a 50 y.o. female presenting for chief complaint of Headache; Dizziness; Emesis (On sunday due to dizziness ); and Dysuria (x2weeks )  Reports 2 week history of dysuria, urinary frequency, polydipsia, now having chills, dizziness with associated nausea and vomiting x1, blurred vision, headache, fatigue, sinus congestion, right sinus pain. Has tried Zyrtec with minimal relief. Admits difficult to control allergies. Also uses Flonase. Denies fever, new back pain, flank pain, abdominal pain, hematuria. Patient is pre-diabetic, takes Metformin once daily. Also has GERD, takes Dexilant for this. Her PCP is with [facility].   Kendra Rodriguez has a current medication list which includes the following prescription(s): dexlansoprazole, metformin, rosuvastatin, loratadine, meclizine, meloxicam, and sucralfate. Also has No Known Allergies.  Kendra Rodriguez  has a past medical history of Acute sinusitis; Allergy; Anxiety; Depression; Dizziness; Ear ache; GERD (gastroesophageal reflux disease); Headache(784.0); Leg pain, left; Lumbar stenosis; Peripheral vascular disease (HCC); and Thyroid disease. Also  has a past surgical history that includes Cholecystectomy; bladder lift; Abdominal hysterectomy; and Lumbar laminectomy/decompression microdiscectomy (Left, 10/24/2014).  Objective:   Vitals: BP 118/78 (BP Location: Left Arm, Patient Position: Sitting, Cuff Size: Normal)   Pulse 63   Temp 98.4 F (36.9 C) (Oral)   Resp 17   Ht 5' 2.75" (1.594 m)   Wt 189 lb (85.7 kg)   SpO2 100%   BMI 33.75 kg/m   Wt Readings from Last 3 Encounters:  05/29/16 189 lb (85.7 kg)  03/21/16 191 lb (86.6 kg)  11/23/15 191 lb 9.6 oz (86.9 kg)   Physical Exam  Constitutional: She is oriented to person, place, and time. She appears well-developed and well-nourished.  HENT:  Right TM sclerotic, flat. Left TM normal but no effusions or erythema bilaterally. Right  nasal turbinates boggy and edematous with right sided maxillary sinus tenderness. Slight postnasal drip present but without oropharyngeal exudates, erythema or abscesses.  Eyes: Pupils are equal, round, and reactive to light. No scleral icterus.  Neck: Normal range of motion. Neck supple. No thyromegaly present.  Cardiovascular: Normal rate, regular rhythm and intact distal pulses.  Exam reveals no gallop and no friction rub.   No murmur heard. Pulmonary/Chest: No respiratory distress. She has no wheezes. She has no rales.  Abdominal: Soft. Bowel sounds are normal. She exhibits no distension and no mass. There is no tenderness. There is no guarding.  Neurological: She is alert and oriented to person, place, and time.  Skin: Skin is warm and dry.   Results for orders placed or performed in visit on 05/29/16 (from the past 24 hour(s))  POCT urinalysis dipstick     Status: None   Collection Time: 05/29/16 10:30 AM  Result Value Ref Range   Color, UA yellow yellow   Clarity, UA clear clear   Glucose, UA negative negative   Bilirubin, UA negative negative   Ketones, POC UA negative negative   Spec Grav, UA 1.015    Blood, UA negative negative   pH, UA 7.5    Protein Ur, POC negative negative   Urobilinogen, UA 0.2    Nitrite, UA Negative Negative   Leukocytes, UA Negative Negative  POCT Microscopic Urinalysis ([facility])     Status: None   Collection Time: 05/29/16 10:31 AM  Result Value Ref Range   WBC,UR,HPF,POC None None WBC/hpf   RBC,UR,HPF,POC None None RBC/hpf   Bacteria None None, Too numerous to count   Mucus Absent Absent   Epithelial  Cells, UR Per Microscopy None None, Too numerous to count cells/hpf  POCT CBC     Status: None   Collection Time: 05/29/16 11:05 AM  Result Value Ref Range   WBC 4.7 4.6 - 10.2 K/uL   Lymph, poc 1.7 0.6 - 3.4   POC LYMPH PERCENT 35.4 10 - 50 %L   MID (cbc) 0.3 0 - 0.9   POC MID % 5.6 0 - 12 %M   POC Granulocyte 2.8 2 - 6.9   Granulocyte  percent 59.0 37 - 80 %G   RBC 4.81 4.04 - 5.48 M/uL   Hemoglobin 13.4 12.2 - 16.2 g/dL   HCT, POC 19.4 17.4 - 47.9 %   MCV 80.6 80 - 97 fL   MCH, POC 27.9 27 - 31.2 pg   MCHC 34.7 31.8 - 35.4 g/dL   RDW, POC 08.1 %   Platelet Count, POC 256 142 - 424 K/uL   MPV 7.4 0 - 99.8 fL  POCT glycosylated hemoglobin (Hb A1C)     Status: None   Collection Time: 05/29/16 11:11 AM  Result Value Ref Range   Hemoglobin A1C 6.0    Assessment and Plan :   1. Dysuria 2. Urinary frequency - May be related to her pre-diabetes which is stable. She does not drink caffeine, sodas, juices. She generally just drinks water. Will monitor her symptoms, consider STI testing, wet prep if they persist.  3. Dizziness 4. Allergic rhinitis, unspecified allergic rhinitis type 5. Eustachian tube dysfunction, bilateral 6. Acute sinusitis, recurrence not specified, unspecified location - I suspect this is all related to her allergies. She is to start Singulair to address allergies. Continue Zyrtec and Flonase. Start amoxicillin to address secondary sinus infection. Recheck in 1 week.  [name], PA-C [facility] [phone] 05/29/2016 10:17 AM

## 2016-05-29 NOTE — Patient Instructions (Addendum)
Sinusitis en adultos (Sinusitis, Adult) La sinusitis es el enrojecimiento, el dolor y la inflamacin de los senos paranasales. Los senos paranasales son cavidades de aire que estn dentro de los huesos de la cara. Se encuentran debajo de los ojos, en la mitad de la frente y encima de los ojos. En los senos paranasales sanos, el moco puede drenar y el aire circula a travs de ellos en su camino hacia la nariz. Sin embargo, cuando se inflaman, el moco y el aire quedan atrapados. Esto hace que se desarrollen bacterias y otros grmenes que causan infeccin. La sinusitis puede desarrollarse rpidamente y durar solo un tiempo corto (aguda) o continuar por un perodo largo (crnica). La sinusitis que dura ms de 12 semanas se considera crnica. CAUSAS Las causas de la sinusitis son:  Alergias.  Las anomalas estructurales, como el desplazamiento del cartlago que separa las fosas nasales (desvo del tabique), que puede disminuir el flujo de aire por la nariz y los senos paranasales, y afectar su drenaje.  Las anomalas funcionales, como cuando los pequeos pelos (cilias) que se encuentran en los senos paranasales y ayudan a eliminar el moco no funcionan correctamente o no estn presentes. SIGNOS Y SNTOMAS Los sntomas de la sinusitis aguda y crnica son los mismos. Los sntomas principales son el dolor y la presin alrededor de los senos paranasales afectados. Otros sntomas son:  Dolor en los dientes superiores.  Dolor de odos.  Dolor de cabeza.  Mal aliento.  Disminucin del sentido del olfato y del gusto.  Tos, que empeora al acostarse.  Fatiga.  Fiebre.  Drenaje de moco espeso por la nariz, que generalmente es de color verde y puede contener pus (purulento).  Hinchazn y calor en los senos paranasales afectados. DIAGNSTICO Su mdico le realizar un examen fsico. Durante el examen, el mdico puede hacer cualquiera de estas cosas para determinar si usted tiene sinusitis aguda o  crnica:  Le revisar la nariz para buscar signos de crecimientos anormales en las fosas nasales (plipos nasales).  Palpar los senos paranasales afectados para buscar signos de infeccin.  Observar la parte interna de los senos paranasales con un dispositivo que tiene una luz (endoscopio). Si el mdico sospecha que usted sufre sinusitis crnica, podr indicar una o ms de las siguientes pruebas:  Pruebas de alergia.  Cultivo de las secreciones nasales. Se extrae una muestra de moco de la nariz, que se enva al laboratorio para detectar bacterias.  Citologa nasal. Se extrae una muestra de moco de la nariz, que el mdico examina para determinar si la sinusitis est relacionada con una alergia. TRATAMIENTO La mayora de los casos de sinusitis aguda se deben a una infeccin viral y se resuelven espontneamente en un perodo de 10das. A veces, se recetan medicamentos como ayuda para aliviar los sntomas tanto de la sinusitis aguda como de la crnica. Estos pueden incluir analgsicos, descongestivos, aerosoles nasales con corticoides o aerosoles de solucin salina. Sin embargo, para la sinusitis por infeccin bacteriana, el mdico le recetar antibiticos. Los antibiticos son medicamentos que destruyen las bacterias que causan la infeccin. Con poca frecuencia, la sinusitis tiene su origen en una infeccin por hongos. En estos casos, el mdico le recetar un medicamento antimictico. Para algunos casos de sinusitis crnica, es necesario someterse a una ciruga. Generalmente, se trata de casos en los que la sinusitis se repite ms de 3veces al ao, a pesar de otros tratamientos. INSTRUCCIONES PARA EL CUIDADO EN EL HOGAR  Beber abundante agua. Los lquidos ayudan a disolver   el moco, para que drene ms fcilmente de los senos paranasales.  Use un humidificador.  Inhale vapor de 3a 4veces al da (por ejemplo, sintese en el bao con la [place]).  Aplquese un pao tibio y hmedo en  la cara 3 o 4veces al da o segn las indicaciones del mdico.  Use un aerosol nasal salino para ayudar a Environmental education officer y [employer] senos nasales.  Tome los medicamentos solamente como se lo haya indicado el mdico.  Si le recetaron un antimictico o un antibitico, asegrese de terminarlos, incluso si comienza a sentirse mejor. SOLICITE ATENCIN MDICA DE INMEDIATO SI:  Siente ms dolor o sufre dolores de cabeza intensos.  Tiene nuseas, vmitos o somnolencia.  Observa hinchazn alrededor del rostro.  Tiene problemas de visin.  Presenta rigidez en el cuello.  Tiene dificultad para respirar.   Esta informacin no tiene Theme park manager el consejo del mdico. Asegrese de hacerle al mdico cualquier pregunta que tenga.   Document Released: 07/30/2005 Document Revised: 11/10/2014 Elsevier Interactive Patient Education 2016 [employer].     Barotitis media (Barotitis Media) La barotitis media es la inflamacin del [place]. Se produce cuando el conducto auditivo (trompa de [place]) que une la parte posterior de la nariz (nasofaringe) con el tmpano, se obstruye. Esta obstruccin puede ser causada por un resfro, alergias ambientales o una infeccin en las vas respiratorias superiores. La barotitis media que no se cura puede llevar a una lesin o a la prdida auditiva barotrauma), que Sales executive a ser [employer]. INSTRUCCIONES PARA EL CUIDADO EN EL HOGAR   Tome todos los medicamentos como le indic el mdico. Los medicamentos de venta libre podrn ayudar a Advertising account planner la obstruccin del canal y a Air traffic controller el pasaje de Soil scientist.  No se introduzca nada en el odo para limpiarlo o destaparlo. Las gotas ticas no lo ayudarn.  No practique natacin ni buceo ni viaje en avin hasta que su mdico le diga que puede [place]. Si realizar estas actividades fueran necesario, puede ser til la goma de [place], que lo har tragar con frecuencia. Tambin lo ayudar si se oprime la Darene Lamer y  sopla suavemente hasta destaparse los odos para equilibrar los cambios de presin. Esto fuerza el aire en las trompas de [place].  Slo tome medicamentos de venta libre o recetados para Primary school teacher, Environmental health practitioner o bajar la [place], segn las indicaciones de su mdico.  Un descongestivo puede ayudarlo a Education administrator el odo medio y Radio producer ms fcil el equilibrio de la presin. SOLICITE ATENCIN MDICA SI:  Experimenta alguna forma de mareo grave, en el que siente como si la habitacin le diera vueltas y tiene nuseas (vrtigo).  Los sntomas slo involucran un odo. SOLICITE ATENCIN MDICA DE INMEDIATO SI:   Siente un fuerte dolor de cabeza, mareos o dolor intenso en el odo.  Tiene un drenaje sanguinolento o similar a pus por el odo.  Tiene fiebre.  Los sntomas no mejoran o empeoran. ASEGRESE DE QUE:   Comprende estas instrucciones.  Controlar su afeccin.  Recibir ayuda de inmediato si no mejora o si empeora.   Esta informacin no tiene Theme park manager el consejo del mdico. Asegrese de hacerle al mdico cualquier pregunta que tenga.   Document Released: 10/20/2005 Document Revised: 10/25/2013 Elsevier Interactive Patient Education 2016 [employer].    [place] (Dizziness) Los mareos son un problema muy frecuente. Se trata de una sensacin de inestabilidad o de desvanecimiento. Puede sentir que se va a desmayar. Un mareo puede  provocarle una lesin si se tropieza o se cae. Las Dealer de todas las edades pueden sufrir Research scientist (life sciences), Biomedical engineer es ms frecuente en los adultos [place]. Esta afeccin puede tener muchas causas, entre las que se pueden [employer], la deshidratacin y [place]. INSTRUCCIONES PARA EL CUIDADO EN EL HOGAR Estas indicaciones pueden ayudarlo con el trastorno: Comida y bebida  Beba suficiente lquido para Pharmacologist la orina clara o de color amarillo plido. Esto evita la deshidratacin. Trate de beber ms lquidos  transparentes, como agua.  No beba alcohol.  Limite el consumo de cafena si el mdico se lo indica.  Limite el consumo de sal si el mdico se lo indica. Actividad  Evite los movimientos rpidos.  Levntese de las sillas con lentitud y apyese hasta sentirse bien.  Por la maana, sintese primero a un lado de la cama. Cuando se sienta bien, pngase lentamente de [address] se sostiene de algo, hasta que sepa que ha logrado el equilibrio.  Mueva las piernas con frecuencia si debe estar de pie en un lugar durante mucho tiempo. Mientras est de pie, contraiga y relaje los msculos de las piernas.  No conduzca vehculos ni opere maquinaria pesada si se siente mareado.  Evite agacharse si se siente mareado. En su casa, coloque los objetos de modo que le resulte fcil alcanzarlos sin Public librarian. Estilo de vida  No consuma ningn producto que contenga tabaco, lo que incluye cigarrillos, tabaco de Theatre manager o Administrator, Civil Service. Si necesita ayuda para dejar de fumar, consulte al [employer].  Trate de reducir el nivel de estrs practicando actividades como el yoga o la meditacin. Hable con el mdico si necesita ayuda. Instrucciones generales  Controle sus mareos para ver si hay cambios.  Tome los medicamentos solamente como se lo haya indicado el mdico. Hable con el mdico si cree que los medicamentos que est tomando son la causa de sus mareos.  Infrmele a un amigo o a un familiar si se siente mareado. Pdale a esta persona que llame al mdico si observa cambios en su comportamiento.  Concurra a todas las visitas de control como se lo haya indicado el mdico. Esto es importante. SOLICITE ATENCIN MDICA SI:  Los [employer].  Los [employer] o la sensacin de Production assistant, radio.  Siente nuseas.  Ha perdido la audicin.  Aparecen nuevos sntomas.  Cuando est de pie se siente inestable o que la habitacin da vueltas. SOLICITE ATENCIN MDICA DE INMEDIATO SI:  Vomita  o tiene diarrea y no puede comer ni beber nada.  Tiene dificultad para hablar, para caminar, para tragar o para [employer], las manos o las piernas.  Siente una debilidad generalizada.  No piensa con claridad o tiene dificultades para armar oraciones. Es posible que un amigo o un familiar adviertan que esto ocurre.  Tiene dolor de pecho, dolor abdominal, sudoracin o Company secretary.  Hay cambios en la visin.  Observa un sangrado.  Tiene dolores de [place].  Tiene dolor o rigidez en el cuello.  Tiene fiebre.   Esta informacin no tiene Theme park manager el consejo del mdico. Asegrese de hacerle al mdico cualquier pregunta que tenga.   Document Released: 10/20/2005 Document Revised: 03/06/2015 Elsevier Interactive Patient Education [employer]   IF you received an x-ray today, you will receive an invoice from [facility]. Please contact [facility] at [phone] with questions or concerns regarding your invoice.   IF you received labwork today, you will receive an invoice from [facility]. Please contact [facility] at [phone] with questions or concerns regarding your invoice.   Our billing staff will not be able to assist you with questions regarding bills from these companies.  You will be contacted with the lab results as soon as they are available. The fastest way to get your results is to activate your My Chart account. Instructions are located on the last page of this paperwork. If you have not heard from [place] regarding the results in 2 weeks, please contact this office.

## 2016-05-30 ENCOUNTER — Encounter: Payer: Self-pay | Admitting: Urgent Care

## 2016-05-30 LAB — URINE CULTURE: ORGANISM ID, BACTERIA: NO GROWTH

## 2016-07-03 ENCOUNTER — Ambulatory Visit
Admission: RE | Admit: 2016-07-03 | Discharge: 2016-07-03 | Disposition: A | Discharge status: Home / Self Care | Payer: BLUE CROSS/BLUE SHIELD | Source: Ambulatory Visit | Attending: Gastroenterology | Admitting: Gastroenterology

## 2016-07-03 ENCOUNTER — Other Ambulatory Visit: Payer: Self-pay | Admitting: Gastroenterology

## 2016-07-03 DIAGNOSIS — R1033 Periumbilical pain: Secondary | ICD-10-CM | POA: Diagnosis not present

## 2016-07-03 DIAGNOSIS — R1031 Right lower quadrant pain: Secondary | ICD-10-CM

## 2016-07-03 DIAGNOSIS — K219 Gastro-esophageal reflux disease without esophagitis: Secondary | ICD-10-CM | POA: Diagnosis not present

## 2016-07-03 DIAGNOSIS — E669 Obesity, unspecified: Secondary | ICD-10-CM | POA: Diagnosis not present

## 2016-07-03 MED ORDER — IOPAMIDOL (ISOVUE-300) INJECTION 61%
100.0000 mL | Freq: Once | INTRAVENOUS | Status: AC | PRN
  Administered 2016-07-03: 100 mL via INTRAVENOUS

## 2016-07-09 DIAGNOSIS — Z23 Encounter for immunization: Secondary | ICD-10-CM | POA: Diagnosis not present

## 2016-07-09 DIAGNOSIS — M5417 Radiculopathy, lumbosacral region: Secondary | ICD-10-CM | POA: Diagnosis not present

## 2016-07-09 DIAGNOSIS — R7309 Other abnormal glucose: Secondary | ICD-10-CM | POA: Diagnosis not present

## 2016-07-09 DIAGNOSIS — E785 Hyperlipidemia, unspecified: Secondary | ICD-10-CM | POA: Diagnosis not present

## 2016-07-09 DIAGNOSIS — M545 Low back pain: Secondary | ICD-10-CM | POA: Diagnosis not present

## 2016-07-17 DIAGNOSIS — Z9889 Other specified postprocedural states: Secondary | ICD-10-CM | POA: Diagnosis not present

## 2016-07-17 DIAGNOSIS — M5441 Lumbago with sciatica, right side: Secondary | ICD-10-CM | POA: Diagnosis not present

## 2016-07-31 DIAGNOSIS — Z01812 Encounter for preprocedural laboratory examination: Secondary | ICD-10-CM | POA: Diagnosis not present

## 2016-08-06 DIAGNOSIS — M4807 Spinal stenosis, lumbosacral region: Secondary | ICD-10-CM | POA: Diagnosis not present

## 2016-09-08 DIAGNOSIS — M25561 Pain in right knee: Secondary | ICD-10-CM | POA: Diagnosis not present

## 2016-09-08 DIAGNOSIS — M5441 Lumbago with sciatica, right side: Secondary | ICD-10-CM | POA: Diagnosis not present

## 2016-09-08 DIAGNOSIS — M5136 Other intervertebral disc degeneration, lumbar region: Secondary | ICD-10-CM | POA: Diagnosis not present

## 2016-09-08 DIAGNOSIS — M4807 Spinal stenosis, lumbosacral region: Secondary | ICD-10-CM | POA: Diagnosis not present

## 2016-09-08 DIAGNOSIS — Z9889 Other specified postprocedural states: Secondary | ICD-10-CM | POA: Diagnosis not present

## 2016-11-13 DIAGNOSIS — J029 Acute pharyngitis, unspecified: Secondary | ICD-10-CM | POA: Diagnosis not present

## 2016-11-13 DIAGNOSIS — R1031 Right lower quadrant pain: Secondary | ICD-10-CM | POA: Diagnosis not present

## 2016-11-13 DIAGNOSIS — H9201 Otalgia, right ear: Secondary | ICD-10-CM | POA: Diagnosis not present

## 2016-11-13 DIAGNOSIS — R7303 Prediabetes: Secondary | ICD-10-CM | POA: Diagnosis not present

## 2016-12-04 DIAGNOSIS — R1031 Right lower quadrant pain: Secondary | ICD-10-CM | POA: Diagnosis not present

## 2016-12-04 DIAGNOSIS — Z6833 Body mass index (BMI) 33.0-33.9, adult: Secondary | ICD-10-CM | POA: Diagnosis not present

## 2016-12-04 DIAGNOSIS — Z124 Encounter for screening for malignant neoplasm of cervix: Secondary | ICD-10-CM | POA: Diagnosis not present

## 2016-12-04 DIAGNOSIS — R102 Pelvic and perineal pain: Secondary | ICD-10-CM | POA: Diagnosis not present

## 2016-12-04 DIAGNOSIS — Z1231 Encounter for screening mammogram for malignant neoplasm of breast: Secondary | ICD-10-CM | POA: Diagnosis not present

## 2016-12-04 DIAGNOSIS — Z01411 Encounter for gynecological examination (general) (routine) with abnormal findings: Secondary | ICD-10-CM | POA: Diagnosis not present

## 2016-12-09 DIAGNOSIS — R102 Pelvic and perineal pain: Secondary | ICD-10-CM | POA: Diagnosis not present

## 2016-12-09 DIAGNOSIS — R1031 Right lower quadrant pain: Secondary | ICD-10-CM | POA: Diagnosis not present

## 2016-12-16 DIAGNOSIS — K219 Gastro-esophageal reflux disease without esophagitis: Secondary | ICD-10-CM | POA: Diagnosis not present

## 2016-12-16 DIAGNOSIS — R1031 Right lower quadrant pain: Secondary | ICD-10-CM | POA: Diagnosis not present

## 2016-12-16 DIAGNOSIS — G894 Chronic pain syndrome: Secondary | ICD-10-CM | POA: Diagnosis not present

## 2016-12-16 DIAGNOSIS — H9201 Otalgia, right ear: Secondary | ICD-10-CM | POA: Diagnosis not present

## 2017-01-28 DIAGNOSIS — M2669 Other specified disorders of temporomandibular joint: Secondary | ICD-10-CM | POA: Diagnosis not present

## 2017-01-28 DIAGNOSIS — H7401 Tympanosclerosis, right ear: Secondary | ICD-10-CM | POA: Diagnosis not present

## 2017-01-28 DIAGNOSIS — H9201 Otalgia, right ear: Secondary | ICD-10-CM | POA: Diagnosis not present

## 2017-02-04 DIAGNOSIS — N3281 Overactive bladder: Secondary | ICD-10-CM | POA: Diagnosis not present

## 2017-03-09 DIAGNOSIS — R1011 Right upper quadrant pain: Secondary | ICD-10-CM | POA: Diagnosis not present

## 2017-03-12 DIAGNOSIS — J309 Allergic rhinitis, unspecified: Secondary | ICD-10-CM | POA: Diagnosis not present

## 2017-04-17 ENCOUNTER — Encounter (INDEPENDENT_AMBULATORY_CARE_PROVIDER_SITE_OTHER): Payer: Self-pay | Admitting: Physician Assistant

## 2017-04-17 ENCOUNTER — Ambulatory Visit (INDEPENDENT_AMBULATORY_CARE_PROVIDER_SITE_OTHER): Payer: BLUE CROSS/BLUE SHIELD | Admitting: Physician Assistant

## 2017-04-17 VITALS — BP 125/69 | HR 69 | Temp 98.1°F | Ht 62.0 in | Wt 191.6 lb

## 2017-04-17 DIAGNOSIS — R0981 Nasal congestion: Secondary | ICD-10-CM | POA: Diagnosis not present

## 2017-04-17 DIAGNOSIS — M4310 Spondylolisthesis, site unspecified: Secondary | ICD-10-CM

## 2017-04-17 DIAGNOSIS — R202 Paresthesia of skin: Secondary | ICD-10-CM | POA: Diagnosis not present

## 2017-04-17 DIAGNOSIS — Z9109 Other allergy status, other than to drugs and biological substances: Secondary | ICD-10-CM | POA: Diagnosis not present

## 2017-04-17 DIAGNOSIS — R5383 Other fatigue: Secondary | ICD-10-CM

## 2017-04-17 DIAGNOSIS — R300 Dysuria: Secondary | ICD-10-CM

## 2017-04-17 DIAGNOSIS — E781 Pure hyperglyceridemia: Secondary | ICD-10-CM | POA: Diagnosis not present

## 2017-04-17 LAB — POCT URINALYSIS DIPSTICK
Bilirubin, UA: NEGATIVE
Blood, UA: NEGATIVE
GLUCOSE UA: NEGATIVE
Ketones, UA: NEGATIVE
Leukocytes, UA: NEGATIVE
NITRITE UA: NEGATIVE
PROTEIN UA: NEGATIVE
SPEC GRAV UA: 1.02 (ref 1.010–1.025)
UROBILINOGEN UA: 0.2 U/dL
pH, UA: 7 (ref 5.0–8.0)

## 2017-04-17 MED ORDER — NAPROXEN 500 MG PO TABS
500.0000 mg | ORAL_TABLET | Freq: Two times a day (BID) | ORAL | 0 refills | Status: DC
Start: 2017-04-17 — End: 2017-06-25

## 2017-04-17 MED ORDER — PHENYLEPHRINE HCL 1 % NA SOLN: 1.0000 [drp] | Freq: Four times a day (QID) | NASAL | 0 refills | Status: AC | PRN

## 2017-04-17 MED ORDER — FLUCONAZOLE 150 MG PO TABS: 150.0000 mg | ORAL_TABLET | Freq: Once | ORAL | 0 refills | Status: AC

## 2017-04-17 MED ORDER — GABAPENTIN 100 MG PO CAPS: 100.0000 mg | ORAL_CAPSULE | Freq: Three times a day (TID) | ORAL | 3 refills | Status: DC

## 2017-04-17 MED ORDER — LEVOCETIRIZINE DIHYDROCHLORIDE 5 MG PO TABS: 5.0000 mg | ORAL_TABLET | Freq: Every evening | ORAL | 0 refills | Status: DC

## 2017-04-17 NOTE — Patient Instructions (Signed)
Fatiga  (Fatigue)  La fatiga es una sensacin de cansancio en todo momento, falta de energa o falta de motivacin. La fatiga ocasional o leve con frecuencia es una reaccin normal a la actividad o la vida en general. Sin embargo, la fatiga de larga duracin (crnica) o extrema puede indicar una enfermedad preexistente.  INSTRUCCIONES PARA EL CUIDADO EN EL HOGAR  Controle su fatiga para ver si hay cambios. Las siguientes indicaciones ayudarn a aliviar cualquier molestia que pueda sentir:   Hable con el mdico acerca de cunto debe dormir cada noche. Trate de dormir la cantidad de tiempo requerida todas las noches.   Tome los medicamentos solamente como se lo haya indicado el mdico.   Siga una dieta saludable y nutritiva. Pida ayuda al mdico si necesita hacer cambios en su dieta.   Beba suficiente lquido para mantener la orina clara o de color amarillo plido.   Practique actividades que lo relajen, como yoga, meditacin, terapia de masajes o acupuntura.   Haga ejercicios regularmente.   Cambie las situaciones que le provocan estrs. Trate de que su rutina de trabajo y personal sea moderada.   No consuma drogas.   Limite el consumo de alcohol a no ms de 1 medida por da si es mujer y no est embarazada, y 2 medidas si es hombre. Una medida equivale a 12onzas de cerveza, 5onzas de vino o 1onzas de bebidas alcohlicas de alta graduacin.   Tome una multivitamina, si se lo indic el mdico.  SOLICITE ATENCIN MDICA SI:   La fatiga no mejora.   Tiene fiebre.   Tiene prdida o aumento involuntario de peso.   Tiene dolores de cabeza.   Tiene dificultad para:  ? Dormirse.  ? Dormir durante toda la noche.   Se siente enojado, culpable, ansioso o triste.   No puede defecar (estreimiento).   Tiene la piel seca.   Tiene hinchadas las piernas u otra parte del cuerpo.  SOLICITE ATENCIN MDICA DE INMEDIATO SI:   Se siente confundido.   Tiene visin borrosa.   Sufre mareos o se desmaya.   Sufre un  dolor intenso de cabeza.   Siente dolor intenso en el abdomen, la pelvis o la espalda.   Tiene dolor de pecho, dificultad para respirar, o latidos cardacos irregulares o acelerados.   No puede orinar u orina menos de lo normal.   Presenta sangrado anormal, como sangrado del recto, la vagina, la nariz, los pulmones o los pezones.   Vomita sangre.   Tiene pensamientos acerca de hacerse dao a s mismo o cometer suicidio.   Le preocupa la posibilidad de hacerle dao a otra persona.  Esta informacin no tiene como fin reemplazar el consejo del mdico. Asegrese de hacerle al mdico cualquier pregunta que tenga.  Document Released: 02/05/2009 Document Revised: 02/11/2016 Document Reviewed: 02/21/2014  Elsevier Interactive Patient Education  2018 Elsevier Inc.
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
## 2017-04-21 ENCOUNTER — Other Ambulatory Visit (INDEPENDENT_AMBULATORY_CARE_PROVIDER_SITE_OTHER): Payer: BLUE CROSS/BLUE SHIELD

## 2017-04-21 DIAGNOSIS — R5383 Other fatigue: Secondary | ICD-10-CM | POA: Diagnosis not present

## 2017-04-21 DIAGNOSIS — E781 Pure hyperglyceridemia: Secondary | ICD-10-CM | POA: Diagnosis not present

## 2017-04-22 ENCOUNTER — Telehealth (INDEPENDENT_AMBULATORY_CARE_PROVIDER_SITE_OTHER): Payer: Self-pay | Admitting: Physician Assistant

## 2017-04-22 LAB — CBC WITH DIFFERENTIAL/PLATELET
BASOS: 0 %
Basophils Absolute: 0 10*3/uL (ref 0.0–0.2)
EOS (ABSOLUTE): 0.1 10*3/uL (ref 0.0–0.4)
Eos: 1 %
HEMOGLOBIN: 12.9 g/dL (ref 11.1–15.9)
Hematocrit: 38.8 % (ref 34.0–46.6)
IMMATURE GRANS (ABS): 0 10*3/uL (ref 0.0–0.1)
Immature Granulocytes: 0 %
LYMPHS: 42 %
Lymphocytes Absolute: 2.1 10*3/uL (ref 0.7–3.1)
MCH: 27.4 pg (ref 26.6–33.0)
MCHC: 33.2 g/dL (ref 31.5–35.7)
MCV: 82 fL (ref 79–97)
MONOCYTES: 7 %
Monocytes Absolute: 0.4 10*3/uL (ref 0.1–0.9)
NEUTROS ABS: 2.5 10*3/uL (ref 1.4–7.0)
Neutrophils: 50 %
Platelets: 271 10*3/uL (ref 150–379)
RBC: 4.71 x10E6/uL (ref 3.77–5.28)
RDW: 13.6 % (ref 12.3–15.4)
WBC: 5.1 10*3/uL (ref 3.4–10.8)

## 2017-04-22 LAB — COMPREHENSIVE METABOLIC PANEL
ALBUMIN: 4.4 g/dL (ref 3.5–5.5)
ALT: 13 IU/L (ref 0–32)
AST: 15 IU/L (ref 0–40)
Albumin/Globulin Ratio: 1.6 (ref 1.2–2.2)
Alkaline Phosphatase: 105 IU/L (ref 39–117)
BILIRUBIN TOTAL: 0.4 mg/dL (ref 0.0–1.2)
BUN/Creatinine Ratio: 27 — ABNORMAL HIGH (ref 9–23)
BUN: 17 mg/dL (ref 6–24)
CO2: 26 mmol/L (ref 20–29)
CREATININE: 0.62 mg/dL (ref 0.57–1.00)
Calcium: 9.6 mg/dL (ref 8.7–10.2)
Chloride: 100 mmol/L (ref 96–106)
GFR, EST AFRICAN AMERICAN: 121 mL/min/{1.73_m2} (ref >59)
GFR, EST NON AFRICAN AMERICAN: 105 mL/min/{1.73_m2} (ref >59)
GLUCOSE: 94 mg/dL (ref 65–99)
Globulin, Total: 2.8 g/dL (ref 1.5–4.5)
Potassium: 4.5 mmol/L (ref 3.5–5.2)
Sodium: 141 mmol/L (ref 134–144)
TOTAL PROTEIN: 7.2 g/dL (ref 6.0–8.5)

## 2017-04-22 LAB — LIPID PANEL
CHOL/HDL RATIO: 2.7 ratio (ref 0.0–4.4)
Cholesterol, Total: 138 mg/dL (ref 100–199)
HDL: 52 mg/dL (ref >39)
LDL CALC: 55 mg/dL (ref 0–99)
Triglycerides: 154 mg/dL — ABNORMAL HIGH (ref 0–149)
VLDL CHOLESTEROL CAL: 31 mg/dL (ref 5–40)

## 2017-04-22 LAB — TSH: TSH: 1.7 u[IU]/mL (ref 0.450–4.500)

## 2017-04-22 NOTE — Telephone Encounter (Signed)
I spoke to patient and answered all her questions. I recommended 2000 IU of Vit D3 and to continue on Metformin and Rosuvastatin.

## 2017-04-22 NOTE — Telephone Encounter (Signed)
Patient called stated did get message labs normal.  Would like to know if Vitamin D levels were checked because  Vitamin D levels are usually low and has been feeling tired.   And if needs to continue Metformin for pre-diabetes  and Rosuvastatin for cholesterol.  Please follow up with patient

## 2017-04-22 NOTE — Telephone Encounter (Signed)
FWD to PCP. Tempestt S Roberts, CMA  

## 2017-06-10 ENCOUNTER — Encounter (INDEPENDENT_AMBULATORY_CARE_PROVIDER_SITE_OTHER): Payer: Self-pay | Admitting: Specialist

## 2017-06-10 ENCOUNTER — Ambulatory Visit (INDEPENDENT_AMBULATORY_CARE_PROVIDER_SITE_OTHER): Payer: BLUE CROSS/BLUE SHIELD | Admitting: Specialist

## 2017-06-10 ENCOUNTER — Ambulatory Visit (INDEPENDENT_AMBULATORY_CARE_PROVIDER_SITE_OTHER): Payer: BLUE CROSS/BLUE SHIELD

## 2017-06-10 VITALS — BP 128/77 | HR 63 | Ht 62.0 in | Wt 191.0 lb

## 2017-06-10 DIAGNOSIS — G8929 Other chronic pain: Secondary | ICD-10-CM

## 2017-06-10 DIAGNOSIS — M4316 Spondylolisthesis, lumbar region: Secondary | ICD-10-CM

## 2017-06-10 DIAGNOSIS — M47816 Spondylosis without myelopathy or radiculopathy, lumbar region: Secondary | ICD-10-CM

## 2017-06-10 DIAGNOSIS — M5441 Lumbago with sciatica, right side: Secondary | ICD-10-CM | POA: Diagnosis not present
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
## 2017-06-10 NOTE — Patient Instructions (Signed)
Avoid frequent bending and stooping  No lifting greater than 15 lbs. May use ice or moist heat for pain. Weight loss is of benefit. Handicap license is approved.   

## 2017-06-25 ENCOUNTER — Ambulatory Visit (INDEPENDENT_AMBULATORY_CARE_PROVIDER_SITE_OTHER): Payer: BLUE CROSS/BLUE SHIELD | Admitting: Urgent Care

## 2017-06-25 ENCOUNTER — Ambulatory Visit (INDEPENDENT_AMBULATORY_CARE_PROVIDER_SITE_OTHER): Payer: BLUE CROSS/BLUE SHIELD | Admitting: Physician Assistant

## 2017-06-25 ENCOUNTER — Encounter: Payer: Self-pay | Admitting: Urgent Care

## 2017-06-25 VITALS — BP 107/70 | HR 71 | Temp 98.6°F | Resp 18 | Ht 62.0 in | Wt 191.0 lb

## 2017-06-25 DIAGNOSIS — E669 Obesity, unspecified: Secondary | ICD-10-CM | POA: Diagnosis not present

## 2017-06-25 DIAGNOSIS — M1712 Unilateral primary osteoarthritis, left knee: Secondary | ICD-10-CM

## 2017-06-25 DIAGNOSIS — M25562 Pain in left knee: Secondary | ICD-10-CM

## 2017-06-25 DIAGNOSIS — K219 Gastro-esophageal reflux disease without esophagitis: Secondary | ICD-10-CM | POA: Diagnosis not present

## 2017-06-25 DIAGNOSIS — R1012 Left upper quadrant pain: Secondary | ICD-10-CM | POA: Diagnosis not present

## 2017-06-25 DIAGNOSIS — R6889 Other general symptoms and signs: Secondary | ICD-10-CM | POA: Diagnosis not present

## 2017-06-25 DIAGNOSIS — M25362 Other instability, left knee: Secondary | ICD-10-CM

## 2017-06-25 MED ORDER — NAPROXEN 500 MG PO TABS: 500.0000 mg | ORAL_TABLET | Freq: Two times a day (BID) | ORAL | 1 refills | Status: DC

## 2017-06-25 NOTE — Patient Instructions (Addendum)
Puede usar naproxen 2 pastillas dos veces diaramente con comida. Seria una dosis de 400-440mg . Para dolor de nervio puede usar gabapentin 300mg  tres veces diaramente.    Artritis (Arthritis) El trmino artritis se [place] comnmente para hacer referencia al dolor de las articulaciones o a la enfermedad articular. Hay ms de 100tipos de artritis. CAUSAS La causa ms frecuente de esta afeccin es el desgaste de una articulacin. Algunas otras causas son las siguientes:  Gota.  Inflamacin de una articulacin.  Una infeccin de Risk analyst.  Esguinces y otras lesiones cerca de la articulacin.  Una reaccin farmacolgica o alrgica. En algunos casos, es posible que la causa no se conozca. SNTOMAS El sntoma principal de esta afeccin es el dolor de la articulacin con el movimiento. Otros sntomas pueden ser los siguientes:  Enrojecimiento, hinchazn o rigidez de Risk analyst.  Calor que emana de Nurse, learning disability.  Grant Ruts.  Sensacin generalizada de estar enfermo. DIAGNSTICO Esta afeccin se puede diagnosticar mediante un examen fsico y Judy Pimple ellos:  Anlisis de [place].  Anlisis de [place].  Estudios de diagnstico por imgenes, como una resonancia magntica (RM), radiografas o una tomografa computarizada (TC). A veces, se extrae lquido de una articulacin para analizarlo. TRATAMIENTO El tratamiento de esta afeccin puede incluir lo siguiente:  El tratamiento de la causa, si se conoce.  Reposo.  Mantener elevada la articulacin.  Aplicar compresas fras o calientes en la articulacin.  Medicamentos para [employer] sntomas y Social research officer, government.  Inyecciones de un corticoide, como cortisona, en la articulacin para ayudar a Engineer, materials y Social research officer, government. Segn la causa de la artritis, tal vez haya que hacer cambios en el estilo de vida para reducir la carga sobre la articulacin. Algunos de los cambios incluyen realizar ms  actividad fsica y Publishing copy de [place]. INSTRUCCIONES PARA EL CUIDADO EN EL HOGAR Medicamentos  [employer] de venta libre y los recetados solamente como se lo haya indicado el mdico.  No tome aspirina para Acupuncturist dolor si se sospecha la presencia de [place]. Actividades  Ponga en reposo la articulacin como se lo haya indicado el mdico. El reposo es importante cuando la enfermedad est activa y la articulacin le duele, est hinchada o rgida.  Evite las actividades que intensifiquen Chief Technology Officer. Es importante Contractor equilibrio entre la actividad y el reposo.  Con frecuencia, realice ejercicios de flexibilidad articular como se lo haya indicado el mdico. Intente realizar ejercicios de bajo impacto, por ejemplo: ? Natacin. ? Gimnasia acutica. ? Andar en bicicleta. ? Caminar. Cuidado de la articulacin  Si la articulacin se le hincha, mantngala elevada como se lo haya indicado el mdico.  Si al despertar por la maana, nota que la articulacin est rgida, intente tomar una ducha con agua tibia.  Si se lo indican, pngase calor en la articulacin. Si es diabtico, no se aplique calor sin la autorizacin del mdico. ? Coloque una toalla entre la articulacin y la compresa caliente o la almohadilla trmica. ? Coloque el calor en la zona durante 20 o .  Si se lo indican, pngase hielo en la articulacin: ? Ponga el hielo en una bolsa plstica. ? Coloque una [employer] piel y la bolsa de hielo. ? Coloque el hielo durante , 2 a 3veces por da.  Concurra a todas las visitas de control como se lo haya indicado el mdico. Esto es importante. SOLICITE ATENCIN MDICA SI:  El dolor empeora.  Tiene fiebre. SOLICITE ATENCIN  MDICA DE INMEDIATO SI:  Siente dolor, u observa hinchazn o enrojecimiento en la articulacin.  Siente dolor en muchas articulaciones y se le hinchan.  Siente un dolor intenso en la espalda.  Tiene mucha  debilidad en la pierna.  No puede controlar los intestinos o la vejiga. Esta informacin no tiene Theme park manager el consejo del mdico. Asegrese de hacerle al mdico cualquier pregunta que tenga. Document Released: 10/20/2005 Document Revised: 02/11/2016 Document Reviewed: 01/15/2015 Elsevier Interactive Patient Education  2018 [employer].   IF you received an x-ray today, you will receive an invoice from [facility]. Please contact [facility] at [phone] with questions or concerns regarding your invoice.   IF you received labwork today, you will receive an invoice from [place]. Please contact [facility] at [phone] with questions or concerns regarding your invoice.   Our billing staff will not be able to assist you with questions regarding bills from these companies.  You will be contacted with the lab results as soon as they are available. The fastest way to get your results is to activate your My Chart account. Instructions are located on the last page of this paperwork. If you have not heard from [place] regarding the results in 2 weeks, please contact this office.

## 2017-06-25 NOTE — Progress Notes (Signed)
    MRN: [id number] DOB: 1966-07-15  Subjective:   Kendra Rodriguez is a 51 y.o. female presenting for chief complaint of Knee Pain (left knee pain started last night when walkking pt states it feels likes its snapping and has a burning feeling from the knee down )  Reports 1 day history of recurrent left knee pain, having knee locking and buckling. She has used her OA knee brace but has not taken any medications for relief. She denies fever, redness, swelling, trauma, falls. She has an orthopedist, set up an appointment on 06/30/2017 with [facility]. Today, she would like to address her pain.   Kendra Rodriguez has a current medication list which includes the following prescription(s): cetirizine, dexlansoprazole, gabapentin, levocetirizine, metformin, montelukast, naproxen, rosuvastatin, and sucralfate. Also has No Known Allergies.  Kendra Rodriguez  has a past medical history of Acute sinusitis; Allergy; Anxiety; Depression; Dizziness; Ear ache; GERD (gastroesophageal reflux disease); Headache(784.0); Leg pain, left; Lumbar stenosis; Peripheral vascular disease (HCC); and Thyroid disease. Also  has a past surgical history that includes Cholecystectomy; bladder lift; Abdominal hysterectomy; and Lumbar laminectomy/decompression microdiscectomy (Left, 10/24/2014).  Objective:   Vitals: BP 107/70   Pulse 71   Temp 98.6 F (37 C) (Oral)   Resp 18   Ht 5\' 2"  (1.575 m)   Wt 191 lb (86.6 kg)   SpO2 97%   BMI 34.93 kg/m   Physical Exam  Constitutional: She is oriented to person, place, and time. She appears well-developed and well-nourished.  Cardiovascular: Normal rate.   Pulmonary/Chest: Effort normal.  Musculoskeletal:       Left knee: She exhibits normal range of motion, no swelling, no effusion, no ecchymosis, no deformity, no laceration, no erythema, normal alignment, normal patellar mobility, no bony tenderness and normal meniscus. Tenderness found. Medial joint line (mild) and lateral joint line (mild)  tenderness noted. No MCL, no LCL and no patellar tendon tenderness noted.  Neurological: She is alert and oriented to person, place, and time.   Assessment and Plan :   1. Acute pain of left knee 2. Knee buckling, left 3. Osteoarthritis of left knee, unspecified osteoarthritis type - We did not have a radiology tech to perform x-rays today. She is to keep her appointment with [facility], will start naproxen for pain and inflammation. Advised rest, continued use of OA reaction knee brace. Return-to-clinic precautions discussed, patient verbalized understanding.   [name], PA-C [facility] [phone] 06/25/2017  1:51 PM

## 2017-06-30 ENCOUNTER — Ambulatory Visit (INDEPENDENT_AMBULATORY_CARE_PROVIDER_SITE_OTHER): Payer: BLUE CROSS/BLUE SHIELD

## 2017-06-30 ENCOUNTER — Ambulatory Visit (INDEPENDENT_AMBULATORY_CARE_PROVIDER_SITE_OTHER): Payer: BLUE CROSS/BLUE SHIELD | Admitting: Specialist

## 2017-06-30 ENCOUNTER — Encounter (INDEPENDENT_AMBULATORY_CARE_PROVIDER_SITE_OTHER): Payer: Self-pay | Admitting: Specialist

## 2017-06-30 VITALS — BP 119/76 | HR 59 | Ht 62.0 in | Wt 191.0 lb

## 2017-06-30 DIAGNOSIS — M25562 Pain in left knee: Secondary | ICD-10-CM | POA: Diagnosis not present

## 2017-06-30 DIAGNOSIS — M2550 Pain in unspecified joint: Secondary | ICD-10-CM

## 2017-06-30 DIAGNOSIS — M1712 Unilateral primary osteoarthritis, left knee: Secondary | ICD-10-CM

## 2017-06-30 DIAGNOSIS — M4316 Spondylolisthesis, lumbar region: Secondary | ICD-10-CM | POA: Diagnosis not present

## 2017-06-30 DIAGNOSIS — M7062 Trochanteric bursitis, left hip: Secondary | ICD-10-CM

## 2017-06-30 DIAGNOSIS — M7061 Trochanteric bursitis, right hip: Secondary | ICD-10-CM

## 2017-06-30 DIAGNOSIS — M5416 Radiculopathy, lumbar region: Secondary | ICD-10-CM

## 2017-06-30 LAB — CBC
HCT: 39.1 % (ref 35.0–45.0)
Hemoglobin: 13 g/dL (ref 11.7–15.5)
MCH: 27.4 pg (ref 27.0–33.0)
MCHC: 33.2 g/dL (ref 32.0–36.0)
MCV: 82.5 fL (ref 80.0–100.0)
MPV: 9.4 fL (ref 7.5–12.5)
PLATELETS: 295 10*3/uL (ref 140–400)
RBC: 4.74 MIL/uL (ref 3.80–5.10)
RDW: 12.7 % (ref 11.0–15.0)
WBC: 4.9 10*3/uL (ref 3.8–10.8)

## 2017-06-30 MED ORDER — METHYLPREDNISOLONE ACETATE 40 MG/ML IJ SUSP
80.0000 mg | INTRAMUSCULAR | Status: AC | PRN
  Administered 2017-06-30: 80 mg

## 2017-06-30 MED ORDER — LIDOCAINE HCL 1 % IJ SOLN
3.0000 mL | INTRAMUSCULAR | Status: AC | PRN
  Administered 2017-06-30: 3 mL

## 2017-06-30 MED ORDER — BUPIVACAINE HCL 0.25 % IJ SOLN
6.0000 mL | INTRAMUSCULAR | Status: AC | PRN
  Administered 2017-06-30: 6 mL via INTRA_ARTICULAR

## 2017-06-30 NOTE — Progress Notes (Signed)
Office Visit Note   Patient: Kendra Rodriguez           Date of Birth: Feb 12, 1966           MRN: [id number] Visit Date: 06/30/2017              Requested by: Clent Demark, PA-C [place], [place] [address] PCP: Clent Demark, PA-C   Assessment & Plan: Visit Diagnoses:  1. Acute pain of left knee   2. Spondylolisthesis, lumbar region   3. Radiculopathy, lumbar region   4. Polyarthralgia   5. Greater trochanteric bursitis of both hips   6. Arthritis of left knee     Plan: Patient's ongoing lumbar spine issues we'll schedule MRI with contrast to rule out HNP/stenosis. Blood work also drawn today to check CBC and arthritis panel. We'll follow-up couple weeks for recheck to review MRI and labs. a given patient some relief of her knee pain offered injection. After patient consent left knee intra-articular Marcaine/Depo-Medrol injection was performed and tolerated without complication. She does not get long-term relief with the injection I would then get an MRI to evaluate the extent of her degenerative changes and also rule out meniscal tear. All questions answered. Interpreter was present throughout.  Follow-Up Instructions: Return in about 2 weeks (around 07/14/2017) for For review of lumbar spine MRI and labs with Dr. Louanne Skye.   Orders:  Orders Placed This Encounter  Procedures  . XR Knee 1-2 Views Left  . MR LUMBAR SPINE W CONTRAST  . CBC  . Rheumatoid Factor  . Antinuclear Antib (ANA)  . Uric acid  . Sed Rate (ESR)   No orders of the defined types were placed in this encounter.     Procedures: Large Joint Inj Date/Time: 06/30/2017 10:40 AM Performed by: Lanae Crumbly Authorized by: Lanae Crumbly   Consent Given by:  Patient Timeout: prior to procedure the correct patient, procedure, and site was verified   Location:  Knee Site:  L knee Needle Size:  25 G Needle Length:  1.5 inches Approach:  Anterolateral Ultrasound Guidance: No     Fluoroscopic Guidance: No   Medications:  3 mL lidocaine 1 %; 80 mg methylPREDNISolone acetate 40 MG/ML; 6 mL bupivacaine 0.25 % Aspiration Attempted: No   Patient tolerance:  Patient tolerated the procedure well with no immediate complications     Clinical Data: No additional findings.   Subjective: Chief Complaint  Patient presents with  . Left Knee - Pain    HPI Patient comes into the office today with complaints of left knee pain and low back pain and left lower extremity radiculopathy. Patient was seen by Dr.  Louanne Skye for lumbar spine issues. status post COMPLETE LUMBAR DECOMPRESSION L4-L5 CENTRAL AND MICRODISCECTOMY L4-L5 LEFT by Dr. Latanya Maudlin 10/24/2014. CT is complaining of low back pain that radiates to the left buttock left thigh down to her calf and foot. This worse with increased activity. No radicular symptoms on the right side. Also complaining of pain over the left greater than right lateral hip. Left knee has also been more bothersome over the last several weeks. No injury. Pain more localized to the medial joint line. Some feeling of catching. No instability. No injury. Also describes grinding behind her kneecap. Pain when she is getting up from a seated position, stairs and squatting. She has used Naprosyn with minimal improvement of the knee but not the lumbar radicular component. Patient not sure of a family  history of rheumatoid arthritis, lupus.   Review of Systems No current cardiac pulmonary GI GU issues.  Objective: Vital Signs: BP 119/76 (BP Location: Left Arm, Patient Position: Sitting)   Pulse (!) 59   Ht _0  (1.575 m)   Wt 191 lb (86.6 kg)   BMI 34.93 kg/m   Physical Exam  Constitutional: She is oriented to person, place, and time. She appears well-developed. No distress.  HENT:  Head: Normocephalic and atraumatic.  Eyes: Pupils are equal, round, and reactive to light. EOM are normal.  Neck: Normal range of motion.  Pulmonary/Chest: No  respiratory distress.  Musculoskeletal:  Patient has left knee pain which she gets up from a seated position. Gait somewhat antalgic. Left lumbar paraspinal tenderness. Left greater than right hip greater trochanter bursa tenderness. Negative logroll. Left knee positive patellofemoral crepitus/grind.  She has marked tenderness over the medial joint line. Ligaments stable. Some swelling without large effusion. Right knee unremarkable. She is moderate tenderness across the left ankle joint. No redness or bruising.  Neurological: She is alert and oriented to person, place, and time.  Skin: Skin is warm and dry.  Psychiatric: She has a normal mood and affect.    Ortho Exam  Specialty Comments:  No specialty comments available.  Imaging: Xr Knee 1-2 Views Left  Result Date: 06/30/2017 X-ray left knee shows tricompartmental degenerative changes most noted medial and patellofemoral compartment. Patellofemoral that shows some lateral tracking. Medial compartment close to bone-on-bone. No acute finding.    PMFS History: Patient Active Problem List   Diagnosis Date Noted  . Prediabetes 10/17/2015  . Hypertriglyceridemia 10/17/2015  . Lumbar disc herniation 10/25/2014  . Spinal stenosis, lumbar region, with neurogenic claudication 10/24/2014  . Varicose veins of right leg with edema 01/30/2014   Past Medical History:  Diagnosis Date  . Acute sinusitis   . Allergy   . Anxiety   . Depression    NO MEDICATIONS - DOING OK NOW  . Dizziness   . Ear ache    RIGHT --HX OF MULTIPLE EAR INFECTIONS AS A CHILD   . GERD (gastroesophageal reflux disease)   . Headache(784.0)    MIGRAINES  . Leg pain, left    with swelling  . Lumbar stenosis    PAIN BACK AND LEFT LEG AND NUMBNESS LEG AND FOOT  . Peripheral vascular disease (HCC)    HX OF TREATMENT FOR VARICOSE VEINS RIGHT LEG  . Thyroid disease    TOLD BLOOD LEVELS SLIGHTLY ABNORMAL - BUT NOT REQUIRED MEDICATION    Family History  Problem  Relation Age of Onset  . Hypertension Mother   . Arthritis Mother   . Diabetes Mother     Past Surgical History:  Procedure Laterality Date  . ABDOMINAL HYSTERECTOMY    . bladder lift     AT SAME TIME OF HYSTERECTOMY  . CHOLECYSTECTOMY    . LUMBAR LAMINECTOMY/DECOMPRESSION MICRODISCECTOMY Left 10/24/2014   Procedure: COMPLETE LUMBAR DECOMPRESSION L4-L5 CENTRAL AND MICRODISCECTOMY L4-L5 LEFT;  Surgeon: Tobi Bastos, MD;  Location: [facility];  Service: Orthopedics;  Laterality: Left;   Social History   Occupational History  . Not on file.   Social History Main Topics  . Smoking status: Never Smoker  . Smokeless tobacco: Never Used  . Alcohol use No  . Drug use: No  . Sexual activity: Yes

## 2017-07-01 LAB — ANA: Anti Nuclear Antibody(ANA): NEGATIVE

## 2017-07-01 LAB — SEDIMENTATION RATE: Sed Rate: 15 mm/hr (ref 0–30)

## 2017-07-01 LAB — RHEUMATOID FACTOR

## 2017-07-01 LAB — URIC ACID: URIC ACID, SERUM: 4.8 mg/dL (ref 2.5–7.0)

## 2017-08-03 ENCOUNTER — Ambulatory Visit (INDEPENDENT_AMBULATORY_CARE_PROVIDER_SITE_OTHER): Payer: BLUE CROSS/BLUE SHIELD | Admitting: Physician Assistant

## 2017-08-03 ENCOUNTER — Encounter (INDEPENDENT_AMBULATORY_CARE_PROVIDER_SITE_OTHER): Payer: Self-pay | Admitting: Physician Assistant

## 2017-08-03 VITALS — BP 101/69 | HR 70 | Temp 98.0°F | Wt 192.0 lb

## 2017-08-03 DIAGNOSIS — R0789 Other chest pain: Secondary | ICD-10-CM

## 2017-08-03 DIAGNOSIS — H9201 Otalgia, right ear: Secondary | ICD-10-CM

## 2017-08-03 DIAGNOSIS — H6991 Unspecified Eustachian tube disorder, right ear: Secondary | ICD-10-CM | POA: Diagnosis not present

## 2017-08-03 DIAGNOSIS — R7303 Prediabetes: Secondary | ICD-10-CM | POA: Diagnosis not present

## 2017-08-03 LAB — POCT GLYCOSYLATED HEMOGLOBIN (HGB A1C): Hemoglobin A1C: 5.7

## 2017-08-03 MED ORDER — PREDNISONE 20 MG PO TABS: 40.0000 mg | ORAL_TABLET | Freq: Every day | ORAL | 0 refills | Status: DC

## 2017-08-03 MED ORDER — NAPROXEN 500 MG PO TABS: 500.0000 mg | ORAL_TABLET | Freq: Two times a day (BID) | ORAL | 1 refills | Status: DC

## 2017-08-03 MED ORDER — FLUTICASONE PROPIONATE 50 MCG/ACT NA SUSP: 2.0000 | Freq: Every day | NASAL | 2 refills | Status: DC

## 2017-08-03 MED ORDER — CETIRIZINE HCL 10 MG PO TABS: 10.0000 mg | ORAL_TABLET | Freq: Every day | ORAL | 11 refills | Status: DC

## 2017-08-03 NOTE — Patient Instructions (Signed)
Disfuncin de la trompa de Eustaquio (Eustachian Tube Dysfunction) La trompa de Eustaquio conecta el odo medio con la parte posterior de la nariz. Regula la presin de aire en el odo medio al permitir que el aire circule por el odo y la nariz. Tambin ayuda a drenar el lquido del espacio del odo medio. Cuando la trompa de Eustaquio no funciona bien, se puede producir una acumulacin de presin de aire, lquido o ambos en el odo medio. La disfuncin de la trompa de Eustaquio puede afectar a uno o a los dos odos. CAUSAS Esta afeccin ocurre cuando la trompa de Eustaquio se bloquea o no puede abrirse normalmente. Puede ser consecuencia de lo siguiente:  Infecciones en los odos.  Resfriados y otras infecciones de las vas respiratorias superiores.  Alergias.  Irritacin, por ejemplo, por el humo del cigarrillo o los cidos del estmago que vuelven hacia el esfago (reflujo gastroesofgico).  Cambios sbitos en la presin del aire, como cuando baja un avin.  Crecimientos anormales en la nariz o la garganta, como plipos nasales, tumores o tejido engrosado en la parte posterior de la garganta (adenoides). FACTORES DE RIESGO Puede ser ms probable que esta afeccin se desarrolle en las personas que fuman y las personas que tienen sobrepeso. Tambin es ms probable que la disfuncin de la trompa de Eustaquio se produzca en los nios, especialmente los nios que presentan lo siguiente:  Ciertos defectos congnitos en la boca, como fisura del paladar.  Amgdalas y adenoides grandes. SNTOMAS Los sntomas de esta afeccin pueden incluir lo siguiente:  Sensacin de que el odo est tapado.  Dolor de odo.  Ruidos como chasquidos o crujidos en el odo.  Zumbidos en el odo.  Prdida auditiva.  Prdida del equilibrio. Los sntomas pueden empeorar cuando la presin que tiene a su alrededor cambia, como cuando viaja a una zona de mayor altura o en avin. DIAGNSTICO Esta afeccin se  puede diagnosticar en funcin de lo siguiente:  Sus sntomas.  Un examen fsico del odo, de la nariz y de la garganta.  Pruebas en las que se determine lo siguiente: ? El movimiento de la membrana del tmpano (timpanograma). ? La audicin (audiometra). TRATAMIENTO El tratamiento depende de la causa y de la gravedad de la afeccin. Si los sntomas son leves, es posible que pueda aliviarlos haciendo circular aire ("destapar") dentro de los odos. Si tiene sntomas de lquido en los odos, el tratamiento puede incluir lo siguiente:  Descongestivos.  Antihistamnicos.  Aerosoles nasales o gotas para los odos que contengan medicamentos para reducir la hinchazn (corticoides). En algunos casos, puede necesitar un procedimiento para drenar el lquido de la membrana del tmpano (miringotoma). En este procedimiento, se coloca un tubo pequeo en la membrana del tmpano para hacer lo siguiente:  Drenar el lquido.  Restablecer el aire en el espacio del odo medio. INSTRUCCIONES PARA EL CUIDADO EN EL HOGAR  Tome los medicamentos de venta libre y los recetados solamente como se lo haya indicado el mdico.  Utilice las tcnicas recomendadas por el mdico para ayudar a destapar los odos. Estas pueden incluir las siguientes: ? Masticar goma de mascar. ? Bostezos. ? Tragar vigorosamente con frecuencia. ? Cerrar la boca, taparse la nariz y soplar suavemente por la nariz como si tratara de soltar el aire.  No haga ninguna de estas cosas hasta que el mdico lo autorice: ? Viajar a grandes alturas. ? Viajar en avin. ? Trabajar en una cabina o una habitacin presurizada. ? Practicar buceo.  Mantener   secos los odos. Squese bien los odos despus de ducharse o darse un bao.  No fume.  Concurra a todas las visitas de control como se lo haya indicado el mdico. Esto es importante. SOLICITE ATENCIN MDICA SI:  Los sntomas no desaparecen despus del tratamiento.  Los sntomas regresan  despus del tratamiento.  No puede destaparse los odos.  Tiene los siguientes sntomas: ? Fiebre. ? Dolor en el odo. ? Dolor de cabeza o en el cuello. ? Hay lquido que sale del odo.  La audicin cambia de repente.  Se siente muy mareado.  Pierde el equilibrio. Esta informacin no tiene como fin reemplazar el consejo del mdico. Asegrese de hacerle al mdico cualquier pregunta que tenga. Document Released: 11/08/2014 Document Revised: 11/08/2014 Document Reviewed: 11/08/2014 Elsevier Interactive Patient Education  2018 [employer]vier [employer]  

## 2017-08-03 NOTE — Progress Notes (Signed)
Subjective:  Patient ID: Kendra Rodriguez, female    DOB: 1966-10-05  Age: 51 y.o. MRN: [id number]  CC: right ear pain  HPI  [name] is a 51 y.o. female with a medical history of anxiety, depression, OAB, GERD, migraines, varicose veins, lumbar stenosis s/p surgical decompression 10/24/14, s/p cholecystectomy, and s/p hypsterectomy, presents with right ear pain and ringing since yesterday.  No close contacts with the same. Has not taken anything for relief. No cough, SOB, fever, abdominal pain, or rash.     Concerned about her current sugar level and cholesterol. Last A1c 6.4%. Last triglycerides 154 mg/dL. Has occasional left sided chest pain that is described as sharp. Relieved when applying pressure to the chest wall. Does not know if attributed to her heartburn. May last from 30 -60 minutes. Does not radiate nor is pain associated with exertion/activities.    Outpatient Medications Prior to Visit  Medication Sig Dispense Refill  . cetirizine (ZYRTEC) 10 MG tablet Take 1 tablet (10 mg total) by mouth daily. 30 tablet 11  . dexlansoprazole (DEXILANT) 60 MG capsule Take 60 mg by mouth every morning.     . gabapentin (NEURONTIN) 100 MG capsule Take 1 capsule (100 mg total) by mouth 3 (three) times daily. 90 capsule 3  . levocetirizine (XYZAL) 5 MG tablet Take 1 tablet (5 mg total) by mouth every evening. 30 tablet 0  . metFORMIN (GLUCOPHAGE) 850 MG tablet Take 850 mg by mouth 2 (two) times daily with a meal.    . montelukast (SINGULAIR) 10 MG tablet Take 1 tablet (10 mg total) by mouth at bedtime. 30 tablet 3  . naproxen (NAPROSYN) 500 MG tablet Take 1 tablet (500 mg total) by mouth 2 (two) times daily with a meal. 30 tablet 1  . rosuvastatin (CRESTOR) 10 MG tablet Take 10 mg by mouth daily.    . sucralfate (CARAFATE) 1 GM/10ML suspension Take 1 g by mouth 4 (four) times daily -  with meals and at bedtime.     No facility-administered medications prior to visit.      ROS Review of  Systems  Constitutional: Negative for chills, fever and malaise/fatigue.  Eyes: Negative for blurred vision.  Respiratory: Negative for shortness of breath.   Cardiovascular: Negative for chest pain and palpitations.  Gastrointestinal: Negative for abdominal pain and nausea.  Genitourinary: Negative for dysuria and hematuria.  Musculoskeletal: Negative for joint pain and myalgias.  Skin: Negative for rash.  Neurological: Negative for tingling and headaches.  Psychiatric/Behavioral: Negative for depression. The patient is not nervous/anxious.     Objective:  There were no vitals taken for this visit.  BP/Weight 06/30/2017 06/25/2017 06/10/2017  Systolic BP 119 107 128  Diastolic BP 76 70 77  Wt. (Lbs) 191 191 191  BMI 34.93 34.93 34.93  Some encounter information is confidential and restricted. Go to Review Flowsheets activity to see all data.      Physical Exam  Constitutional: She is oriented to person, place, and time.  Well developed, well nourished, NAD, polite  HENT:  Head: Normocephalic and atraumatic.  TMs normal with exception of some scarring of the right TM. Turbinates hypertrophic, pale, and with moderate amount of clear mucus bilaterally  Eyes: No scleral icterus.  Neck: Normal range of motion. Neck supple. No thyromegaly present.  Cardiovascular: Normal rate, regular rhythm and normal heart sounds.   Pulmonary/Chest: Effort normal and breath sounds normal.  Musculoskeletal: She exhibits no edema.  Neurological: She is alert and  oriented to person, place, and time. No cranial nerve deficit. Coordination normal.  Skin: Skin is warm and dry. No rash noted. No erythema. No pallor.  Psychiatric: She has a normal mood and affect. Her behavior is normal. Thought content normal.  Vitals reviewed.    Assessment & Plan:    1. Disorder of right eustachian tube - predniSONE (DELTASONE) 20 MG tablet; Take 2 tablets (40 mg total) by mouth daily with breakfast.  Dispense: 10  tablet; Refill: 0 - fluticasone (FLONASE) 50 MCG/ACT nasal spray; Place 2 sprays into both nostrils daily.  Dispense: 16 g; Refill: 2 - cetirizine (ZYRTEC) 10 MG tablet; Take 1 tablet (10 mg total) by mouth daily.  Dispense: 30 tablet; Refill: 11  2. Otalgia of right ear - Begin  predniSONE (DELTASONE) 20 MG tablet; Take 2 tablets (40 mg total) by mouth daily with breakfast.  Dispense: 10 tablet; Refill: 0 - Begin fluticasone (FLONASE) 50 MCG/ACT nasal spray; Place 2 sprays into both nostrils daily.  Dispense: 16 g; Refill: 2 - Begin cetirizine (ZYRTEC) 10 MG tablet; Take 1 tablet (10 mg total) by mouth daily.  Dispense: 30 tablet; Refill: 11  3. Chest wall pain - Begin naproxen (NAPROSYN) 500 MG tablet after finishing prednisone; Take 1 tablet (500 mg total) by mouth 2 (two) times daily with a meal.  Dispense: 30 tablet; Refill: 1  4. Prediabetes - HgB A1c 5.7%in clinic today.   Meds ordered this encounter  Medications  . predniSONE (DELTASONE) 20 MG tablet    Sig: Take 2 tablets (40 mg total) by mouth daily with breakfast.    Dispense:  10 tablet    Refill:  0    Order Specific Question:   Supervising Provider    Answer:   Quentin Angst [id number]  . fluticasone (FLONASE) 50 MCG/ACT nasal spray    Sig: Place 2 sprays into both nostrils daily.    Dispense:  16 g    Refill:  2    Order Specific Question:   Supervising Provider    Answer:   Quentin Angst [id number]  . cetirizine (ZYRTEC) 10 MG tablet    Sig: Take 1 tablet (10 mg total) by mouth daily.    Dispense:  30 tablet    Refill:  11    Order Specific Question:   Supervising Provider    Answer:   Quentin Angst [id number]  . naproxen (NAPROSYN) 500 MG tablet    Sig: Take 1 tablet (500 mg total) by mouth 2 (two) times daily with a meal.    Dispense:  30 tablet    Refill:  1    Order Specific Question:   Supervising Provider    Answer:   Quentin Angst [id number]    Follow-up: Return if symptoms  worsen or fail to improve, for return for flu vaccine.   Loletta Specter PA

## 2017-08-07 ENCOUNTER — Other Ambulatory Visit (INDEPENDENT_AMBULATORY_CARE_PROVIDER_SITE_OTHER): Payer: Self-pay | Admitting: Physician Assistant

## 2017-08-07 ENCOUNTER — Ambulatory Visit
Admission: RE | Admit: 2017-08-07 | Discharge: 2017-08-07 | Disposition: A | Discharge status: Home / Self Care | Payer: BLUE CROSS/BLUE SHIELD | Source: Ambulatory Visit | Attending: Surgery | Admitting: Surgery

## 2017-08-07 ENCOUNTER — Other Ambulatory Visit: Payer: BLUE CROSS/BLUE SHIELD

## 2017-08-07 DIAGNOSIS — M5416 Radiculopathy, lumbar region: Secondary | ICD-10-CM

## 2017-08-07 DIAGNOSIS — M48061 Spinal stenosis, lumbar region without neurogenic claudication: Secondary | ICD-10-CM | POA: Diagnosis not present

## 2017-08-07 MED ORDER — GADOBENATE DIMEGLUMINE 529 MG/ML IV SOLN
18.0000 mL | Freq: Once | INTRAVENOUS | Status: AC | PRN
  Administered 2017-08-07: 18 mL via INTRAVENOUS

## 2017-08-07 NOTE — Telephone Encounter (Signed)
FWD to PCP. Tempestt S Roberts, CMA  

## 2017-08-14 ENCOUNTER — Encounter (INDEPENDENT_AMBULATORY_CARE_PROVIDER_SITE_OTHER): Payer: Self-pay | Admitting: Specialist

## 2017-08-14 ENCOUNTER — Ambulatory Visit (INDEPENDENT_AMBULATORY_CARE_PROVIDER_SITE_OTHER): Payer: BLUE CROSS/BLUE SHIELD | Admitting: Specialist

## 2017-08-14 VITALS — BP 130/77 | HR 65 | Ht 62.0 in | Wt 191.0 lb

## 2017-08-14 DIAGNOSIS — M5136 Other intervertebral disc degeneration, lumbar region: Secondary | ICD-10-CM

## 2017-08-14 DIAGNOSIS — M4316 Spondylolisthesis, lumbar region: Secondary | ICD-10-CM | POA: Diagnosis not present

## 2017-08-14 DIAGNOSIS — M1712 Unilateral primary osteoarthritis, left knee: Secondary | ICD-10-CM | POA: Diagnosis not present

## 2017-08-14 MED ORDER — DICLOFENAC-MISOPROSTOL 50-0.2 MG PO TBEC: 1.0000 | DELAYED_RELEASE_TABLET | Freq: Three times a day (TID) | ORAL | 1 refills | Status: AC

## 2017-08-14 NOTE — Patient Instructions (Signed)
  Knee is suffering from osteoarthritis, only real proven treatments are Weight loss, NSIADs like diclofenac and exercise. Well padded shoes help. Ice the knee 2-3 times a day 15-20 mins at a time. Avoid frequent bending and stooping  No lifting greater than 10-15 lbs. May use ice or moist heat for pain. Weight loss is of benefit. Handicap license is approved. See a physical therapist for the lumbar spine and left knee.

## 2017-08-14 NOTE — Progress Notes (Signed)
Office Visit Note   Patient: Kendra Rodriguez           Date of Birth: Feb 21, 1966           MRN: [id number] Visit Date: 08/14/2017              Requested by: Loletta Specter, PA-C [address] [place], [place] [address] PCP: Loletta Specter, PA-C   Assessment & Plan: Visit Diagnoses:  1. Spondylolisthesis, lumbar region   2. Degenerative disc disease, lumbar   3. Unilateral primary osteoarthritis, left knee     Plan: Knee is suffering from osteoarthritis, only real proven treatments are Weight loss, NSIADs like diclofenac and exercise. Well padded shoes help. Ice the knee 2-3 times a day 15-20 mins at a time. Avoid frequent bending and stooping  No lifting greater than 10-15 lbs. May use ice or moist heat for pain. Weight loss is of benefit. Handicap license is approved. See a physical therapist for the lumbar spine and left knee.  Follow-Up Instructions: No Follow-up on file.   Orders:  No orders of the defined types were placed in this encounter.  No orders of the defined types were placed in this encounter.     Procedures: No procedures performed   Clinical Data: No additional findings.   Subjective: Chief Complaint  Patient presents with  . Lower Back - Follow-up    MRI Review-Lsp    51 year old female with history of L4-5 laminectomy by Dr. Darrelyn Rodriguez in 2015. She has been seen by Dr. [name] at [facility] and has had previous ESIs, her pain is worse with bending, stooping, Lifting and sitting. Riding in a car, mopping, sweeping,picking up sticks and stooping. She has pain when she is straining at the bathroom. Pain is into the left anterior thigh to the knee.  Back pain worse than leg pain. She does not want surgery.    Review of Systems  Constitutional: Negative.   HENT: Negative.   Eyes: Negative.   Respiratory: Negative.   Cardiovascular: Negative.   Gastrointestinal: Negative.   Endocrine: Negative.   Genitourinary: Negative.     Musculoskeletal: Negative.   Skin: Negative.   Allergic/Immunologic: Negative.   Neurological: Negative.   Hematological: Negative.   Psychiatric/Behavioral: Negative.      Objective: Vital Signs: BP 130/77 (BP Location: Left Arm, Patient Position: Sitting)   Pulse 65   Ht  (1.575 m)   Wt 191 lb (86.6 kg)   BMI 34.93 kg/m   Physical Exam  Constitutional: She is oriented to person, place, and time. She appears well-developed and well-nourished.  HENT:  Head: Normocephalic and atraumatic.  Eyes: Pupils are equal, round, and reactive to light. EOM are normal.  Neck: Normal range of motion. Neck supple.  Pulmonary/Chest: Effort normal and breath sounds normal.  Abdominal: Soft. Bowel sounds are normal.  Musculoskeletal: Normal range of motion.  Neurological: She is alert and oriented to person, place, and time.  Skin: Skin is warm and dry.  Psychiatric: She has a normal mood and affect. Her behavior is normal. Judgment and thought content normal.    Back Exam   Tenderness  The patient is experiencing tenderness in the lumbar.  Range of Motion  Extension: normal  Flexion: normal  Lateral Bend Right: normal  Lateral Bend Left: normal  Rotation Right: normal  Rotation Left: normal   Muscle Strength  Right Quadriceps:  5/5  Left Quadriceps:  5/5  Right Hamstrings:  5/5  Left Hamstrings:  5/5   Tests  Straight leg raise right: negative Straight leg raise left: negative  Reflexes  Patellar: 2/4 Achilles: Hyporeflexic Babinski's sign: normal   Other  Toe Walk: normal Heel Walk: normal Sensation: normal Gait: normal  Erythema: no back redness Scars: present      Specialty Comments:  No specialty comments available.  Imaging: No results found.   PMFS History: Patient Active Problem List   Diagnosis Date Noted  . Prediabetes 10/17/2015  . Hypertriglyceridemia 10/17/2015  . Lumbar disc herniation 10/25/2014  . Spinal stenosis, lumbar region,  with neurogenic claudication 10/24/2014  . Varicose veins of right leg with edema 01/30/2014   Past Medical History:  Diagnosis Date  . Acute sinusitis   . Allergy   . Anxiety   . Depression    NO MEDICATIONS - DOING OK NOW  . Dizziness   . Ear ache    RIGHT --HX OF MULTIPLE EAR INFECTIONS AS A CHILD   . GERD (gastroesophageal reflux disease)   . Headache(784.0)    MIGRAINES  . Leg pain, left    with swelling  . Lumbar stenosis    PAIN BACK AND LEFT LEG AND NUMBNESS LEG AND FOOT  . Peripheral vascular disease (HCC)    HX OF TREATMENT FOR VARICOSE VEINS RIGHT LEG  . Thyroid disease    TOLD BLOOD LEVELS SLIGHTLY ABNORMAL - BUT NOT REQUIRED MEDICATION    Family History  Problem Relation Age of Onset  . Hypertension Mother   . Arthritis Mother   . Diabetes Mother     Past Surgical History:  Procedure Laterality Date  . ABDOMINAL HYSTERECTOMY    . bladder lift     AT SAME TIME OF HYSTERECTOMY  . CHOLECYSTECTOMY    . LUMBAR LAMINECTOMY/DECOMPRESSION MICRODISCECTOMY Left 10/24/2014   Procedure: COMPLETE LUMBAR DECOMPRESSION L4-L5 CENTRAL AND MICRODISCECTOMY L4-L5 LEFT;  Surgeon: Jacki Cones, MD;  Location: [facility];  Service: Orthopedics;  Laterality: Left;   Social History   Occupational History  . Not on file.   Social History Main Topics  . Smoking status: Never Smoker  . Smokeless tobacco: Never Used  . Alcohol use No  . Drug use: No  . Sexual activity: Yes

## 2017-08-18 DIAGNOSIS — Z23 Encounter for immunization: Secondary | ICD-10-CM | POA: Diagnosis not present

## 2017-08-24 ENCOUNTER — Encounter: Payer: Self-pay | Admitting: Physical Therapy

## 2017-08-24 ENCOUNTER — Ambulatory Visit: Payer: BLUE CROSS/BLUE SHIELD | Attending: Specialist | Admitting: Physical Therapy

## 2017-08-24 DIAGNOSIS — M6281 Muscle weakness (generalized): Secondary | ICD-10-CM | POA: Diagnosis not present

## 2017-08-24 DIAGNOSIS — M25662 Stiffness of left knee, not elsewhere classified: Secondary | ICD-10-CM | POA: Diagnosis not present

## 2017-08-24 DIAGNOSIS — G8929 Other chronic pain: Secondary | ICD-10-CM | POA: Diagnosis not present

## 2017-08-24 DIAGNOSIS — M5442 Lumbago with sciatica, left side: Secondary | ICD-10-CM | POA: Diagnosis not present

## 2017-08-24 DIAGNOSIS — M25562 Pain in left knee: Secondary | ICD-10-CM | POA: Insufficient documentation

## 2017-08-24 NOTE — Therapy (Signed)
[facility] [address] [place], [place], [address] Phone: [phone]   Fax:  [phone]  Physical Therapy Evaluation  Patient Details  Name: Kendra Rodriguez MRN: [id number] Date of Birth: [birth year] Referring Provider: [name]  Encounter Date: 08/24/2017      PT End of Session - 08/24/17 1425    Visit Number 1   Number of Visits 13   Date for PT Re-Evaluation 10/05/17   PT Start Time 1331   PT Stop Time 1430   PT Time Calculation (min) 59 min   Activity Tolerance Patient tolerated treatment well   Behavior During Therapy [facility] for tasks assessed/performed      Past Medical History:  Diagnosis Date  . Acute sinusitis   . Allergy   . Anxiety   . Depression    NO MEDICATIONS - DOING OK NOW  . Dizziness   . Ear ache    RIGHT --HX OF MULTIPLE EAR INFECTIONS AS A CHILD   . GERD (gastroesophageal reflux disease)   . Headache(784.0)    MIGRAINES  . Leg pain, left    with swelling  . Lumbar stenosis    PAIN BACK AND LEFT LEG AND NUMBNESS LEG AND FOOT  . Peripheral vascular disease (HCC)    HX OF TREATMENT FOR VARICOSE VEINS RIGHT LEG  . Thyroid disease    TOLD BLOOD LEVELS SLIGHTLY ABNORMAL - BUT NOT REQUIRED MEDICATION    Past Surgical History:  Procedure Laterality Date  . ABDOMINAL HYSTERECTOMY    . bladder lift     AT SAME TIME OF HYSTERECTOMY  . CHOLECYSTECTOMY    . LUMBAR LAMINECTOMY/DECOMPRESSION MICRODISCECTOMY Left 10/24/2014   Procedure: COMPLETE LUMBAR DECOMPRESSION L4-L5 CENTRAL AND MICRODISCECTOMY L4-L5 LEFT;  Surgeon: Jacki Cones, MD;  Location: [facility];  Service: Orthopedics;  Laterality: Left;    There were no vitals filed for this visit.       Subjective Assessment - 08/24/17 1341    Subjective pt is a 51 y.o with CC of low back and L knee pain. reports hx of chronic low back pain starting over 5 years ago with low back surgery 3 years ago with relief of the LLE pain. reports pain  in the back started hurting that started about a year after the surgery with specific MOI with referral down to the L knee. with bowel movement it causes the entire leg to hurt. pt reports no saddle paresthesia, reports occasional incontent accident with sleeping but nothing when she is awake. pt reports knee pain started about the same time the low back pain did with no specific MOI   Limitations Lifting;Walking;Standing  no lifting over 15#   How long can you sit comfortably? 15 min   How long can you stand comfortably? 1 hour   How long can you walk comfortably? 20 min  more comfortable with amb   Diagnostic tests MRI 08/10/2017   Patient Stated Goals to feel better, bending forward   Currently in Pain? Yes   Pain Score 5   at worst 8/10   Pain Location Back   Pain Orientation Right;Left;Lower   Pain Descriptors / Indicators Tightness;Throbbing   Pain Type Chronic pain   Pain Radiating Towards to the L knee with occaisonal referral to the foot   Pain Onset More than a month ago   Pain Frequency Constant   Aggravating Factors  bending forward, prolonged sitting,    Pain Relieving Factors walking around, resting, heat  Effect of Pain on Daily Activities limited endurance with positions   Multiple Pain Sites Yes   Pain Score 0  with walking at worst 6/10   Pain Location Knee   Pain Orientation Left   Pain Descriptors / Indicators --  popping, locking, clicking   Pain Type Chronic pain   Pain Onset More than a month ago   Pain Frequency Intermittent   Aggravating Factors  walking   Pain Relieving Factors injections,    Effect of Pain on Daily Activities limited CKC endurance             [facility] PT Assessment - 08/24/17 1351      Assessment   Medical Diagnosis L knee OA, and low back disc tear and spondylosis   Referring Provider Vira BrownsJames Nitka MD   Onset Date/Surgical Date --  2 years ago   Hand Dominance Right   Next MD Visit --  1 year   Prior Therapy yes      Precautions   Precaution Comments no lifting over 15#, avoid pushing heavy shopping cart     Restrictions   Weight Bearing Restrictions No     Balance Screen   Has the patient fallen in the past 6 months No   Has the patient had a decrease in activity level because of a fear of falling?  No   Is the patient reluctant to leave their home because of a fear of falling?  No     Home Environment   Living Environment Private residence   Living Arrangements Spouse/significant other   Available Help at Discharge Family;Available PRN/intermittently   Type of Home House   Home Access Stairs to enter   Entrance Stairs-Number of Steps 10   Entrance Stairs-Rails Can reach both  can reach in the front of th ehouse, but unable to in the ba   Home Layout --   Home Equipment None     Prior Function   Level of Independence Independent with basic ADLs   Vocation Unemployed   Leisure cooking     Cognition   Overall Cognitive Status Within Functional Limits for tasks assessed     Observation/Other Assessments   Focus on Therapeutic Outcomes (FOTO)  61% limited  predicted 43% limited     Posture/Postural Control   Posture/Postural Control Postural limitations   Postural Limitations Rounded Shoulders;Forward head     ROM / Strength   AROM / PROM / Strength AROM;Strength;PROM     AROM   AROM Assessment Site Lumbar;Knee   Right/Left Knee Right;Left   Right Knee Extension 0   Right Knee Flexion 130   Left Knee Extension 0   Left Knee Flexion 110   Lumbar Flexion 68  referral of sx down the LLE   Lumbar Extension 14  pinching at end range   Lumbar - Right Side Bend 13   Lumbar - Left Side Bend 14     PROM   PROM Assessment Site Knee   Right/Left Knee Left   Left Knee Extension 0   Left Knee Flexion 130     Strength   Strength Assessment Site Lumbar;Hip   Right/Left Hip Left;Right   Right Hip ABduction 4-/5   Left Hip ABduction 3+/5   Right/Left Knee Left;Right   Right Knee  Flexion 4+/5   Right Knee Extension 4+/5   Left Knee Flexion 4-/5  pain   Left Knee Extension 4-/5     Palpation   Spinal mobility hypomobility of L1-T11  Palpation comment TTP along bil lumbar paraspinals     Special Tests    Special Tests Lumbar   Lumbar Tests Straight Leg Raise;Prone Knee Bend Test     Prone Knee Bend Test   Findings Negative     Straight Leg Raise   Findings Positive   Comment ipsilatera/ contralateral            Objective measurements completed on examination: See above findings.          [facility] Adult PT Treatment/Exercise - 08/24/17 1351      Lumbar Exercises: Stretches   Lower Trunk Rotation Limitations --  2 x 10   Prone on Elbows Stretch --  10 x holding 5 sec    Prone on Elbows Stretch Limitations educated on benefits of tx     Lumbar Exercises: Supine   Clam 10 reps  with red theraband   Bent Knee Raise 10 reps  with ADIM                PT Education - 08/24/17 1424    Education provided Yes   Education Details evaluation findings, POC, HEP with form/ rationale.    Person(s) Educated Patient   Methods Explanation;Verbal cues;Handout   Comprehension Verbalized understanding;Verbal cues required          PT Short Term Goals - 08/24/17 1439      PT SHORT TERM GOAL #1   Title pt to be I with inital HEP   Time 3   Period Weeks   Status New   Target Date 09/14/17     PT SHORT TERM GOAL #2   Title pt to verbalize and demo proper posture with sitting/ standing and lifting mechanics to prevent and reduce low back pain   Time 3   Period Weeks   Status New   Target Date 09/14/17     PT SHORT TERM GOAL #3   Title pt to report pain to </= 4/10 in the back with no refrral of pain down the LLE to demo improvement in condition   Time 3   Period Weeks   Status New   Target Date 09/14/17           PT Long Term Goals - 08/24/17 1442      PT LONG TERM GOAL #1   Title pt to increase low back flexion to >/= 75  degrees, and extension/ bil sidebending to >/= 20   degrees with </= 2/10 painto promote funcitonal mobility required for ADLs    Time 6   Period Weeks   Status New   Target Date 10/05/17     PT LONG TERM GOAL #2   Title improve bil LE strength to >/= 4+/5 to promote hip stability and assist with maintain good form with lifting/ carrying mechanics    Time 6   Period Weeks   Status New   Target Date 10/05/17     PT LONG TERM GOAL #3   Title pt to increase sitting/ standing and walking endurnace to >/=1 hour with </= 2/10 pain for functional community amb and ADLs    Time 6   Period Weeks   Status New   Target Date 10/05/17     PT LONG TERM GOAL #4   Title pt to increase FOTO score </= 43% limited to demo improvement in function    Time 6   Period Weeks   Status New   Target Date 10/05/17  Plan - 08/24/17 1425    Clinical Impression Statement pt presents to OPPT with CC of chronic low back pain with referral down the LLE and L knee pain. She underwent a microdiscectomy and laminectomy 3 years ago with pain returning about 1 year following the surgery. pt demonstrated limited trunk mobility with exacerbation of referal sx down the LLE with flexion. pt demonstrated postive special testing indicating probability of disc involvement. mild limited knee mobility with pain noted during MMT. she would benefit from physical therapy to decrease low back pain/ referral sx, improve trunk/ knee mobility, increase L knee strength by addressing the deficits listed.    History and Personal Factors relevant to plan of care: Pmhx of lumbar microdiscectomy/ laminectomy, hx of depression, anxiety, spanish speaking   Clinical Presentation Evolving   Clinical Presentation due to: limited trunk mobility, low back pain/ L knee pain, decreased L knee ROM, weakness   Clinical Decision Making Moderate   Rehab Potential Good   PT Frequency 2x / week   PT Duration 6 weeks   PT  Treatment/Interventions ADLs/Self Care Home Management;Patient/family education;Cryotherapy;Electrical Stimulation;Iontophoresis 4mg /ml Dexamethasone;Moist Heat;Traction;Ultrasound;Therapeutic activities;Therapeutic exercise;Dry needling;Taping;Manual techniques;Passive range of motion;Neuromuscular re-education;Balance training   PT Next Visit Plan review and update HEP, extension biased treatment with progression, core strengthening. modalities,    PT Home Exercise Plan prone press-up, clams, supine marching. LTR   Consulted and Agree with Plan of Care Patient      Patient will benefit from skilled therapeutic intervention in order to improve the following deficits and impairments:  Pain, Improper body mechanics, Postural dysfunction, Decreased range of motion, Decreased strength, Decreased activity tolerance, Decreased endurance, Increased fascial restricitons  Visit Diagnosis: Chronic bilateral low back pain with left-sided sciatica - Plan: PT plan of care cert/re-cert  Chronic pain of left knee - Plan: PT plan of care cert/re-cert  Stiffness of left knee, not elsewhere classified - Plan: PT plan of care cert/re-cert  Muscle weakness (generalized) - Plan: PT plan of care cert/re-cert     Problem List Patient Active Problem List   Diagnosis Date Noted  . Prediabetes 10/17/2015  . Hypertriglyceridemia 10/17/2015  . Lumbar disc herniation 10/25/2014  . Spinal stenosis, lumbar region, with neurogenic claudication 10/24/2014  . Varicose veins of right leg with edema 01/30/2014   Lulu Riding PT, DPT, LAT, ATC  08/24/17  2:55 PM      [facility] [address] [place], [place], [address] Phone: [phone]   Fax:  [phone]  Name: NATOSHA BOU MRN: [id number] Date of Birth: 1966-02-24

## 2017-08-31 ENCOUNTER — Ambulatory Visit: Payer: BLUE CROSS/BLUE SHIELD | Admitting: Physical Therapy

## 2017-08-31 ENCOUNTER — Encounter: Payer: Self-pay | Admitting: Physical Therapy

## 2017-08-31 DIAGNOSIS — M6281 Muscle weakness (generalized): Secondary | ICD-10-CM | POA: Diagnosis not present

## 2017-08-31 DIAGNOSIS — M25562 Pain in left knee: Secondary | ICD-10-CM | POA: Diagnosis not present

## 2017-08-31 DIAGNOSIS — M5442 Lumbago with sciatica, left side: Secondary | ICD-10-CM | POA: Diagnosis not present

## 2017-08-31 DIAGNOSIS — M25662 Stiffness of left knee, not elsewhere classified: Secondary | ICD-10-CM

## 2017-08-31 DIAGNOSIS — G8929 Other chronic pain: Secondary | ICD-10-CM

## 2017-08-31 NOTE — Therapy (Signed)
[facility] [address] [place], [place], [address] Phone: [phone]   Fax:  [phone]  Physical Therapy Treatment  Patient Details  Name: Kendra Rodriguez MRN: [id number] Date of Birth: [birth year] Referring Provider: [name]  Encounter Date: 08/31/2017      PT End of Session - 08/31/17 1642    Visit Number 2   Number of Visits 13   Date for PT Re-Evaluation 10/05/17   PT Start Time 1332   PT Stop Time 1427   PT Time Calculation (min) 55 min   Activity Tolerance Patient tolerated treatment well   Behavior During Therapy [facility] for tasks assessed/performed      Past Medical History:  Diagnosis Date  . Acute sinusitis   . Allergy   . Anxiety   . Depression    NO MEDICATIONS - DOING OK NOW  . Dizziness   . Ear ache    RIGHT --HX OF MULTIPLE EAR INFECTIONS AS A CHILD   . GERD (gastroesophageal reflux disease)   . Headache(784.0)    MIGRAINES  . Leg pain, left    with swelling  . Lumbar stenosis    PAIN BACK AND LEFT LEG AND NUMBNESS LEG AND FOOT  . Peripheral vascular disease (HCC)    HX OF TREATMENT FOR VARICOSE VEINS RIGHT LEG  . Thyroid disease    TOLD BLOOD LEVELS SLIGHTLY ABNORMAL - BUT NOT REQUIRED MEDICATION    Past Surgical History:  Procedure Laterality Date  . ABDOMINAL HYSTERECTOMY    . bladder lift     AT SAME TIME OF HYSTERECTOMY  . CHOLECYSTECTOMY    . LUMBAR LAMINECTOMY/DECOMPRESSION MICRODISCECTOMY Left 10/24/2014   Procedure: COMPLETE LUMBAR DECOMPRESSION L4-L5 CENTRAL AND MICRODISCECTOMY L4-L5 LEFT;  Surgeon: Jacki Cones, MD;  Location: [facility];  Service: Orthopedics;  Laterality: Left;    There were no vitals filed for this visit.      Subjective Assessment - 08/31/17 1358    Subjective Exercises help.    Patient is accompained by: Interpreter   Currently in Pain? Yes   Pain Score 5    Pain Location Back   Pain Orientation Right;Left;Lower   Pain Descriptors / Indicators  Aching;Throbbing;Numbness   Pain Type Chronic pain   Pain Radiating Towards to knee    Pain Onset Today   Pain Frequency Constant   Aggravating Factors  bending over,  ADL.  Housework   Pain Relieving Factors resting,   heat,  walking around   Effect of Pain on Daily Activities limits endurance with ADL's   Multiple Pain Sites Yes   Pain Score --  mild   Pain Location Knee   Pain Orientation Left   Pain Type Chronic pain   Pain Frequency Intermittent   Pain Relieving Factors injections                         [facility] Adult PT Treatment/Exercise - 08/31/17 0001      Self-Care   Self-Care ADL's;Posture   ADL's demo modifications,  correct techniques.   Posture sitting,  standing     Lumbar Exercises: Stretches   Passive Hamstring Stretch 3 reps;30 seconds   Lower Trunk Rotation Limitations 5 reps,  cued for pain free range.   Prone on Elbows Stretch Limitations 5 reps,  irritating initially then pain eases     Lumbar Exercises: Supine   Clam 10 reps  red band,  cued technique   Bent  Knee Raise 10 reps   Bridge 10 reps   Bridge Limitations small lifts,  mild discomfort     Lumbar Exercises: Prone   Straight Leg Raises Limitations 2 reps increased back pain.  pain decreased with soft tissue work    Other Prone Lumbar Exercises multifitus with knee flex     Knee/Hip Exercises: Doctor, hospital 3 reps;30 seconds   Gastroc Stretch Limitations each     Modalities   Modalities Moist Heat     Moist Heat Therapy   Number Minutes Moist Heat 10 Minutes   Moist Heat Location --  low back     Manual Therapy   Manual therapy comments pain from prone exercise eases with soft tissue work,  instrument assist and with hands.  Tissue was softened ,  Pain decreased.  On right side.,   gentle PROM left QL                PT Education - 08/31/17 1642    Education provided Yes   Education Details ADL body mechanics,     Person(s) Educated Patient    Methods Explanation;Demonstration;Verbal cues   Comprehension Verbalized understanding;Need further instruction          PT Short Term Goals - 08/24/17 1439      PT SHORT TERM GOAL #1   Title pt to be I with inital HEP   Time 3   Period Weeks   Status New   Target Date 09/14/17     PT SHORT TERM GOAL #2   Title pt to verbalize and demo proper posture with sitting/ standing and lifting mechanics to prevent and reduce low back pain   Time 3   Period Weeks   Status New   Target Date 09/14/17     PT SHORT TERM GOAL #3   Title pt to report pain to </= 4/10 in the back with no refrral of pain down the LLE to demo improvement in condition   Time 3   Period Weeks   Status New   Target Date 09/14/17           PT Long Term Goals - 08/24/17 1442      PT LONG TERM GOAL #1   Title pt to increase low back flexion to >/= 75 degrees, and extension/ bil sidebending to >/= 20   degrees with </= 2/10 painto promote funcitonal mobility required for ADLs    Time 6   Period Weeks   Status New   Target Date 10/05/17     PT LONG TERM GOAL #2   Title improve bil LE strength to >/= 4+/5 to promote hip stability and assist with maintain good form with lifting/ carrying mechanics    Time 6   Period Weeks   Status New   Target Date 10/05/17     PT LONG TERM GOAL #3   Title pt to increase sitting/ standing and walking endurnace to >/=1 hour with </= 2/10 pain for functional community amb and ADLs    Time 6   Period Weeks   Status New   Target Date 10/05/17     PT LONG TERM GOAL #4   Title pt to increase FOTO score </= 43% limited to demo improvement in function    Time 6   Period Weeks   Status New   Target Date 10/05/17               Plan - 08/31/17 1643  Clinical Impression Statement Pain flare with prone hip extension today.  NO pain at end of session.  Patient required min cues with HEP  She has been doing ADL's at home with poor posture,  bending over,   reaching, etc.  She will try to limit amount of pain  with new techniques and frequent rests, vs getting the task done with increasing pain , then resting.     PT Treatment/Interventions ADLs/Self Care Home Management;Patient/family education;Cryotherapy;Electrical Stimulation;Iontophoresis 4mg /ml Dexamethasone;Moist Heat;Traction;Ultrasound;Therapeutic activities;Therapeutic exercise;Dry needling;Taping;Manual techniques;Passive range of motion;Neuromuscular re-education;Balance training   PT Next Visit Plan review and update HEP, extension biased treatment with progression, core strengthening. modalities, , ADL handouts (Spanish)   PT Home Exercise Plan prone press-up, clams, supine marching. LTR   Consulted and Agree with Plan of Care Patient      Patient will benefit from skilled therapeutic intervention in order to improve the following deficits and impairments:  Pain, Improper body mechanics, Postural dysfunction, Decreased range of motion, Decreased strength, Decreased activity tolerance, Decreased endurance, Increased fascial restricitons  Visit Diagnosis: Chronic bilateral low back pain with left-sided sciatica  Chronic pain of left knee  Stiffness of left knee, not elsewhere classified  Muscle weakness (generalized)     Problem List Patient Active Problem List   Diagnosis Date Noted  . Prediabetes 10/17/2015  . Hypertriglyceridemia 10/17/2015  . Lumbar disc herniation 10/25/2014  . Spinal stenosis, lumbar region, with neurogenic claudication 10/24/2014  . Varicose veins of right leg with edema 01/30/2014    Keara Pagliarulo PTA 08/31/2017, 4:47 PM  [facility] [address] [place], [place], [address] Phone: [phone]   Fax:  [phone]  Name: Kendra Rodriguez MRN: [id number] Date of Birth: Jan 03, 1966

## 2017-09-02 ENCOUNTER — Ambulatory Visit: Payer: BLUE CROSS/BLUE SHIELD | Admitting: Physical Therapy

## 2017-09-02 ENCOUNTER — Encounter: Payer: Self-pay | Admitting: Physical Therapy

## 2017-09-02 DIAGNOSIS — G8929 Other chronic pain: Secondary | ICD-10-CM

## 2017-09-02 DIAGNOSIS — M25662 Stiffness of left knee, not elsewhere classified: Secondary | ICD-10-CM | POA: Diagnosis not present

## 2017-09-02 DIAGNOSIS — M25562 Pain in left knee: Secondary | ICD-10-CM | POA: Diagnosis not present

## 2017-09-02 DIAGNOSIS — M6281 Muscle weakness (generalized): Secondary | ICD-10-CM

## 2017-09-02 DIAGNOSIS — M5442 Lumbago with sciatica, left side: Principal | ICD-10-CM

## 2017-09-02 NOTE — Therapy (Signed)
[facility] [address] [place], [place], [address] Phone: [phone]   Fax:  [phone]  Physical Therapy Treatment  Patient Details  Name: CAMIE HAUSS MRN: [id number] Date of Birth: 11/01/1966 Referring Provider: [name]  Encounter Date: 09/02/2017      PT End of Session - 09/02/17 1548    Visit Number 3   Number of Visits 13   Date for PT Re-Evaluation 10/05/17   PT Start Time 1500   PT Stop Time 1542   PT Time Calculation (min) 42 min   Activity Tolerance Patient tolerated treatment well   Behavior During Therapy [facility] for tasks assessed/performed      Past Medical History:  Diagnosis Date  . Acute sinusitis   . Allergy   . Anxiety   . Depression    NO MEDICATIONS - DOING OK NOW  . Dizziness   . Ear ache    RIGHT --HX OF MULTIPLE EAR INFECTIONS AS A CHILD   . GERD (gastroesophageal reflux disease)   . Headache(784.0)    MIGRAINES  . Leg pain, left    with swelling  . Lumbar stenosis    PAIN BACK AND LEFT LEG AND NUMBNESS LEG AND FOOT  . Peripheral vascular disease (HCC)    HX OF TREATMENT FOR VARICOSE VEINS RIGHT LEG  . Thyroid disease    TOLD BLOOD LEVELS SLIGHTLY ABNORMAL - BUT NOT REQUIRED MEDICATION    Past Surgical History:  Procedure Laterality Date  . ABDOMINAL HYSTERECTOMY    . bladder lift     AT SAME TIME OF HYSTERECTOMY  . CHOLECYSTECTOMY    . LUMBAR LAMINECTOMY/DECOMPRESSION MICRODISCECTOMY Left 10/24/2014   Procedure: COMPLETE LUMBAR DECOMPRESSION L4-L5 CENTRAL AND MICRODISCECTOMY L4-L5 LEFT;  Surgeon: Jacki Cones, MD;  Location: [facility];  Service: Orthopedics;  Laterality: Left;    There were no vitals filed for this visit.      Subjective Assessment - 09/02/17 1459    Subjective "I am doing better, pain only about 4-5/10. the exercises are good "   Currently in Pain? Yes   Pain Score 4    Pain Location Back   Pain Orientation Right;Left;Lower   Pain Score 6   Pain Orientation Right;Left   Aggravating Factors  walking                         [facility] Adult PT Treatment/Exercise - 09/02/17 1523      Lumbar Exercises: Stretches   Prone on Elbows Stretch --  1 x 20   Press Ups --  2 x 15 reps     Lumbar Exercises: Supine   Bent Knee Raise 20 reps  with ADIM x 2 sets   Bridge --  2 x 12 with ball squeeze     Knee/Hip Exercises: Stretches   Passive Hamstring Stretch 2 reps;30 seconds;Both     Knee/Hip Exercises: Aerobic   Nustep L5 x 4 min, L6 x 2, UE/LE     Knee/Hip Exercises: Supine   Short Arc Quad Sets 2 sets  12 reps with ball squeeze   Short Arc Quad Sets Limitations to promote VMO activation     Knee/Hip Exercises: Sidelying   Hip ABduction 2 sets;15 reps;Right  controlled eccentrics on 2nd set     Moist Heat Therapy   Number Minutes Moist Heat --     Manual Therapy   Manual Therapy Soft tissue mobilization;Joint mobilization   Manual therapy  comments Manual trigger point release over the vastus lateralis, DTM over vastus lateralis   Joint Mobilization slow creep medial patellar mobs                PT Education - 09/02/17 1546    Education provided Yes   Education Details updated HEP for prone press-up progression.    Person(s) Educated Patient   Methods Explanation;Verbal cues   Comprehension Verbalized understanding;Verbal cues required          PT Short Term Goals - 08/24/17 1439      PT SHORT TERM GOAL #1   Title pt to be I with inital HEP   Time 3   Period Weeks   Status New   Target Date 09/14/17     PT SHORT TERM GOAL #2   Title pt to verbalize and demo proper posture with sitting/ standing and lifting mechanics to prevent and reduce low back pain   Time 3   Period Weeks   Status New   Target Date 09/14/17     PT SHORT TERM GOAL #3   Title pt to report pain to </= 4/10 in the back with no refrral of pain down the LLE to demo improvement in condition   Time 3   Period  Weeks   Status New   Target Date 09/14/17           PT Long Term Goals - 08/24/17 1442      PT LONG TERM GOAL #1   Title pt to increase low back flexion to >/= 75 degrees, and extension/ bil sidebending to >/= 20   degrees with </= 2/10 painto promote funcitonal mobility required for ADLs    Time 6   Period Weeks   Status New   Target Date 10/05/17     PT LONG TERM GOAL #2   Title improve bil LE strength to >/= 4+/5 to promote hip stability and assist with maintain good form with lifting/ carrying mechanics    Time 6   Period Weeks   Status New   Target Date 10/05/17     PT LONG TERM GOAL #3   Title pt to increase sitting/ standing and walking endurnace to >/=1 hour with </= 2/10 pain for functional community amb and ADLs    Time 6   Period Weeks   Status New   Target Date 10/05/17     PT LONG TERM GOAL #4   Title pt to increase FOTO score </= 43% limited to demo improvement in function    Time 6   Period Weeks   Status New   Target Date 10/05/17               Plan - 09/02/17 1549    Clinical Impression Statement pt reports improvement of back pain but reports increased soreness in the R knee. Following manual techniques for the vastus lateralis and VMO activation techniques. Progressed prone extension biased treatment which she performed well reporting pain dropped in the low back to 1/10 and in the knee to 3/10.    PT Treatment/Interventions ADLs/Self Care Home Management;Patient/family education;Cryotherapy;Electrical Stimulation;Iontophoresis 4mg /ml Dexamethasone;Moist Heat;Traction;Ultrasound;Therapeutic activities;Therapeutic exercise;Dry needling;Taping;Manual techniques;Passive range of motion;Neuromuscular re-education;Balance training   PT Next Visit Plan Update HEP, extension biased treatment with progression, core strengthening. modalities, ADL handouts (Spanish), Vastus lateralis manual, and VMO activsation techinques. trial McConnel taping, discuss DN.    PT Home Exercise Plan prone press-up, clams, supine marching. LTR, prone    Consulted and  Agree with Plan of Care Patient      Patient will benefit from skilled therapeutic intervention in order to improve the following deficits and impairments:  Pain, Improper body mechanics, Postural dysfunction, Decreased range of motion, Decreased strength, Decreased activity tolerance, Decreased endurance, Increased fascial restricitons  Visit Diagnosis: Chronic bilateral low back pain with left-sided sciatica  Chronic pain of left knee  Stiffness of left knee, not elsewhere classified  Muscle weakness (generalized)     Problem List Patient Active Problem List   Diagnosis Date Noted  . Prediabetes 10/17/2015  . Hypertriglyceridemia 10/17/2015  . Lumbar disc herniation 10/25/2014  . Spinal stenosis, lumbar region, with neurogenic claudication 10/24/2014  . Varicose veins of right leg with edema 01/30/2014   Lulu RidingKristoffer Latoya Diskin PT, DPT, LAT, ATC  09/02/17  3:57 PM      [facility] [address] [place], [place], [address] Phone: [phone]   Fax:  [phone]  Name: Teofilo Podva B Remillard MRN: [id number] Date of Birth: [birth year]

## 2017-09-07 ENCOUNTER — Encounter: Payer: Self-pay | Admitting: Physical Therapy

## 2017-09-07 ENCOUNTER — Ambulatory Visit: Payer: BLUE CROSS/BLUE SHIELD | Attending: Specialist | Admitting: Physical Therapy

## 2017-09-07 DIAGNOSIS — M5442 Lumbago with sciatica, left side: Secondary | ICD-10-CM | POA: Insufficient documentation

## 2017-09-07 DIAGNOSIS — G8929 Other chronic pain: Secondary | ICD-10-CM | POA: Insufficient documentation

## 2017-09-07 DIAGNOSIS — M25662 Stiffness of left knee, not elsewhere classified: Secondary | ICD-10-CM | POA: Insufficient documentation

## 2017-09-07 DIAGNOSIS — M6281 Muscle weakness (generalized): Secondary | ICD-10-CM | POA: Diagnosis not present

## 2017-09-07 DIAGNOSIS — M25562 Pain in left knee: Secondary | ICD-10-CM | POA: Insufficient documentation

## 2017-09-07 NOTE — Therapy (Signed)
[facility] [address] [place], [place], [address] Phone: [phone]   Fax:  [phone]  Physical Therapy Treatment  Patient Details  Name: Kendra Rodriguez MRN: [id number] Date of Birth: 03/27/1966 Referring Provider: [name]   Encounter Date: 09/07/2017  PT End of Session - 09/07/17 1424    Visit Number  4    Number of Visits  13    Date for PT Re-Evaluation  10/05/17    PT Start Time  1337    PT Stop Time  1430    PT Time Calculation (min)  53 min    Activity Tolerance  Patient tolerated treatment well    Behavior During Therapy  [facility] for tasks assessed/performed       Past Medical History:  Diagnosis Date  . Acute sinusitis   . Allergy   . Anxiety   . Depression    NO MEDICATIONS - DOING OK NOW  . Dizziness   . Ear ache    RIGHT --HX OF MULTIPLE EAR INFECTIONS AS A CHILD   . GERD (gastroesophageal reflux disease)   . Headache(784.0)    MIGRAINES  . Leg pain, left    with swelling  . Lumbar stenosis    PAIN BACK AND LEFT LEG AND NUMBNESS LEG AND FOOT  . Peripheral vascular disease (HCC)    HX OF TREATMENT FOR VARICOSE VEINS RIGHT LEG  . Thyroid disease    TOLD BLOOD LEVELS SLIGHTLY ABNORMAL - BUT NOT REQUIRED MEDICATION    Past Surgical History:  Procedure Laterality Date  . ABDOMINAL HYSTERECTOMY    . bladder lift     AT SAME TIME OF HYSTERECTOMY  . CHOLECYSTECTOMY      There were no vitals filed for this visit.  Subjective Assessment - 09/07/17 1344    Subjective  Pain 6/10 low back and last toes right foot,  Burning .  in toe.  (Burning in toe new)  I did OK after the last session.      Currently in Pain?  Yes    Pain Score  6     Pain Location  Back    Pain Descriptors / Indicators  Burning;Cramping    Pain Score  0    Pain Location  Knee    Pain Orientation  Right    Pain Relieving Factors  exercises                      [facility] Adult PT Treatment/Exercise - 09/07/17  0001      Lumbar Exercises: Stretches   Passive Hamstring Stretch  3 reps;30 seconds both,  feels good ROM good   both,  feels good ROM good   Standing Extension  3 reps 5 seconds   5 seconds     Lumbar Exercises: Supine   Bent Knee Raise  20 reps Abdominal work concurrent.   Abdominal work concurrent.   Bridge  -- 2 x 12 with ball squeeze   2 x 12 with ball squeeze   Bridge Limitations    purple ball      Knee/Hip Exercises: Supine   Short Arc [employer]  10 reps    Short Arc Quad Sets Limitations  with ball squeeze and 3 LBS right. and 3 LBS right   and 3 LBS right     Knee/Hip Exercises: Sidelying   Clams  10       Modalities   Modalities  Cryotherapy  Cryotherapy   Number Minutes Cryotherapy  10 Minutes    Cryotherapy Location  -- foot right   foot right   Type of Cryotherapy  -- cold pack   cold pack     Manual Therapy   Manual therapy comments  KT tape to create space lateral foot.  suspect itape will not last.               PT Short Term Goals - 09/07/17 1440      PT SHORT TERM GOAL #1   Title  pt to be I with inital HEP    Baseline  independent with exercises issued so sfar    Time  3    Period  Weeks    Status  On-going      PT SHORT TERM GOAL #2   Title  pt to verbalize and demo proper posture with sitting/ standing and lifting mechanics to prevent and reduce low back pain    Time  3    Period  Weeks    Status  Unable to assess      PT SHORT TERM GOAL #3   Title  pt to report pain to </= 4/10 in the back with no refrral of pain down the LLE to demo improvement in condition    Baseline  6/10 back pain,  no referral intp leg, however has New lateral foor burning right    Time  3    Period  Weeks    Status  On-going        PT Long Term Goals - 08/24/17 1442      PT LONG TERM GOAL #1   Title  pt to increase low back flexion to >/= 75 degrees, and extension/ bil sidebending to >/= 20   degrees with </= 2/10 painto promote funcitonal  mobility required for ADLs     Time  6    Period  Weeks    Status  New    Target Date  10/05/17      PT LONG TERM GOAL #2   Title  improve bil LE strength to >/= 4+/5 to promote hip stability and assist with maintain good form with lifting/ carrying mechanics     Time  6    Period  Weeks    Status  New    Target Date  10/05/17      PT LONG TERM GOAL #3   Title  pt to increase sitting/ standing and walking endurnace to >/=1 hour with </= 2/10 pain for functional community amb and ADLs     Time  6    Period  Weeks    Status  New    Target Date  10/05/17      PT LONG TERM GOAL #4   Title  pt to increase FOTO score </= 43% limited to demo improvement in function     Time  6    Period  Weeks    Status  New    Target Date  10/05/17            Plan - 09/07/17 1425    Clinical Impression Statement  No right knee pain.  Back pain off and on.  she has burning lateral foot.  I suspect it is local,  trial of tape.  Tissue tender to palpation.  Noted left  calf visabily smaller than right.  No back pain or knee pain noted during session.      PT Next Visit  Plan  Update HEP, extension biased treatment with progression, core strengthening. modalities, ADL handouts (Spanish), Vastus lateralis manual, and VMO activsation techinques. trial McConnel taping, discuss DN.  assess foot tape.    PT Home Exercise Plan  prone press-up, clams, supine marching. LTR, prone     Consulted and Agree with Plan of Care  Patient       Patient will benefit from skilled therapeutic intervention in order to improve the following deficits and impairments:     Visit Diagnosis: Chronic bilateral low back pain with left-sided sciatica  Chronic pain of left knee  Stiffness of left knee, not elsewhere classified  Muscle weakness (generalized)     Problem List Patient Active Problem List   Diagnosis Date Noted  . Prediabetes 10/17/2015  . Hypertriglyceridemia 10/17/2015  . Lumbar disc herniation  10/25/2014  . Spinal stenosis, lumbar region, with neurogenic claudication 10/24/2014  . Varicose veins of right leg with edema 01/30/2014    Linville Decarolis PTA 09/07/2017, 2:56 PM  [facility] [address] [place], [place], [address] Phone: [phone]   Fax:  [phone]  Name: Kendra Rodriguez MRN: [id number] Date of Birth: May 01, 1966

## 2017-09-09 ENCOUNTER — Ambulatory Visit: Payer: BLUE CROSS/BLUE SHIELD | Admitting: Physical Therapy

## 2017-09-09 ENCOUNTER — Encounter: Payer: Self-pay | Admitting: Physical Therapy

## 2017-09-09 DIAGNOSIS — M25662 Stiffness of left knee, not elsewhere classified: Secondary | ICD-10-CM

## 2017-09-09 DIAGNOSIS — M5442 Lumbago with sciatica, left side: Principal | ICD-10-CM

## 2017-09-09 DIAGNOSIS — M25562 Pain in left knee: Secondary | ICD-10-CM

## 2017-09-09 DIAGNOSIS — M6281 Muscle weakness (generalized): Secondary | ICD-10-CM

## 2017-09-09 DIAGNOSIS — G8929 Other chronic pain: Secondary | ICD-10-CM

## 2017-09-09 NOTE — Therapy (Signed)
[facility] [address] [place], [place], [address] Phone: [phone]   Fax:  [phone]  Physical Therapy Treatment  Patient Details  Name: Kendra Rodriguez MRN: [id number] Date of Birth: 1966-06-05 Referring Provider: Vira BrownsJames Nitka MD   Encounter Date: 09/09/2017  PT End of Session - 09/09/17 1547    Visit Number  5    Number of Visits  13    Date for PT Re-Evaluation  10/05/17    PT Start Time  1505    PT Stop Time  1545    PT Time Calculation (min)  40 min    Activity Tolerance  Patient tolerated treatment well    Behavior During Therapy  [facility] for tasks assessed/performed       Past Medical History:  Diagnosis Date  . Acute sinusitis   . Allergy   . Anxiety   . Depression    NO MEDICATIONS - DOING OK NOW  . Dizziness   . Ear ache    RIGHT --HX OF MULTIPLE EAR INFECTIONS AS A CHILD   . GERD (gastroesophageal reflux disease)   . Headache(784.0)    MIGRAINES  . Leg pain, left    with swelling  . Lumbar stenosis    PAIN BACK AND LEFT LEG AND NUMBNESS LEG AND FOOT  . Peripheral vascular disease (HCC)    HX OF TREATMENT FOR VARICOSE VEINS RIGHT LEG  . Thyroid disease    TOLD BLOOD LEVELS SLIGHTLY ABNORMAL - BUT NOT REQUIRED MEDICATION    Past Surgical History:  Procedure Laterality Date  . ABDOMINAL HYSTERECTOMY    . bladder lift     AT SAME TIME OF HYSTERECTOMY  . CHOLECYSTECTOMY      There were no vitals filed for this visit.  Subjective Assessment - 09/09/17 1509    Subjective  "I am having more soreness in the R foot and my knee is much betteer, with only 4/10 pain in the back"     Currently in Pain?  Yes    Pain Score  4     Pain Location  Back    Pain Orientation  Right;Left    Pain Descriptors / Indicators  Aching    Pain Type  Chronic pain    Pain Frequency  Intermittent    Aggravating Factors   bending forward    Pain Relieving Factors  resting    Pain Score  0    Pain Location  Knee    Pain  Orientation  Right                      [facility] Adult PT Treatment/Exercise - 09/09/17 1613      Lumbar Exercises: Seated   Other Seated Lumbar Exercises  seated on dyna disc pelvic tilt 2 x 10, marching while sitting on dyna disc 2 x 10 with ADIM, horizontal lift/ chop 2 x 10 bil with red theraband while       Knee/Hip Exercises: Standing   SLS with Vectors  2 x 10 bil 4 -way with red theraband      Manual Therapy   Manual Therapy  Other (comment)    Manual therapy comments  MTPR over peroneal longus     Joint Mobilization  proximal fibular head glides and 4th/5th MTP glides    Other Manual Therapy  metatarsal bar in the R shoe to provide support and lift up the metatarsals  PT Education - 09/09/17 1547    Education provided  Yes    Education Details  updated HEP for standing hip strengthening. benefits of trialing metatarsal bar.     Person(s) Educated  Patient    Methods  Explanation;Verbal cues    Comprehension  Verbalized understanding;Verbal cues required       PT Short Term Goals - 09/07/17 1440      PT SHORT TERM GOAL #1   Title  pt to be I with inital HEP    Baseline  independent with exercises issued so sfar    Time  3    Period  Weeks    Status  On-going      PT SHORT TERM GOAL #2   Title  pt to verbalize and demo proper posture with sitting/ standing and lifting mechanics to prevent and reduce low back pain    Time  3    Period  Weeks    Status  Unable to assess      PT SHORT TERM GOAL #3   Title  pt to report pain to </= 4/10 in the back with no refrral of pain down the LLE to demo improvement in condition    Baseline  6/10 back pain,  no referral intp leg, however has New lateral foor burning right    Time  3    Period  Weeks    Status  On-going        PT Long Term Goals - 08/24/17 1442      PT LONG TERM GOAL #1   Title  pt to increase low back flexion to >/= 75 degrees, and extension/ bil sidebending to >/= 20    degrees with </= 2/10 painto promote funcitonal mobility required for ADLs     Time  6    Period  Weeks    Status  New    Target Date  10/05/17      PT LONG TERM GOAL #2   Title  improve bil LE strength to >/= 4+/5 to promote hip stability and assist with maintain good form with lifting/ carrying mechanics     Time  6    Period  Weeks    Status  New    Target Date  10/05/17      PT LONG TERM GOAL #3   Title  pt to increase sitting/ standing and walking endurnace to >/=1 hour with </= 2/10 pain for functional community amb and ADLs     Time  6    Period  Weeks    Status  New    Target Date  10/05/17      PT LONG TERM GOAL #4   Title  pt to increase FOTO score </= 43% limited to demo improvement in function     Time  6    Period  Weeks    Status  New    Target Date  10/05/17            Plan - 09/09/17 1548    Clinical Impression Statement  pt reports no pain inthe knee but has increased pain inthe R lateral foot. Following manual for the peroneals which she reported decreased pain in the foot. trialed metatarsal bar in the shoe to help seperate the metatarals. following hip strengthening she reported no pain in the with walking/ standing in the foot, knee or back.    PT Next Visit Plan  Update HEP, core strengthening. modalities, ADL handouts (Spanish), Vastus  lateralis manual, and VMO activsation techinques. trial McConnel taping, discuss DN.  assess metatarsal bar    PT Home Exercise Plan  prone press-up, clams, supine marching. LTR, prone, standing hip abduction/ extension    Consulted and Agree with Plan of Care  Patient       Patient will benefit from skilled therapeutic intervention in order to improve the following deficits and impairments:  Pain, Improper body mechanics, Postural dysfunction, Decreased range of motion, Decreased strength, Decreased activity tolerance, Decreased endurance, Increased fascial restricitons  Visit Diagnosis: Chronic bilateral low back  pain with left-sided sciatica  Chronic pain of left knee  Stiffness of left knee, not elsewhere classified  Muscle weakness (generalized)     Problem List Patient Active Problem List   Diagnosis Date Noted  . Prediabetes 10/17/2015  . Hypertriglyceridemia 10/17/2015  . Lumbar disc herniation 10/25/2014  . Spinal stenosis, lumbar region, with neurogenic claudication 10/24/2014  . Varicose veins of right leg with edema 01/30/2014   Lulu RidingKristoffer Almendra Loria PT, DPT, LAT, ATC  09/09/17  4:22 PM      [facility] [address] [place], [place], [address] Phone: [phone]   Fax:  [phone]  Name: Kendra Rodriguez MRN: [id number] Date of Birth: September 25, 1966

## 2017-09-14 ENCOUNTER — Ambulatory Visit: Payer: BLUE CROSS/BLUE SHIELD | Admitting: Physical Therapy

## 2017-09-14 ENCOUNTER — Encounter: Payer: Self-pay | Admitting: Physical Therapy

## 2017-09-14 ENCOUNTER — Ambulatory Visit (INDEPENDENT_AMBULATORY_CARE_PROVIDER_SITE_OTHER): Payer: BLUE CROSS/BLUE SHIELD | Admitting: Physician Assistant

## 2017-09-14 ENCOUNTER — Encounter (INDEPENDENT_AMBULATORY_CARE_PROVIDER_SITE_OTHER): Payer: Self-pay | Admitting: Physician Assistant

## 2017-09-14 VITALS — Ht 62.0 in | Wt 193.0 lb

## 2017-09-14 DIAGNOSIS — M25662 Stiffness of left knee, not elsewhere classified: Secondary | ICD-10-CM

## 2017-09-14 DIAGNOSIS — R202 Paresthesia of skin: Secondary | ICD-10-CM

## 2017-09-14 DIAGNOSIS — G8929 Other chronic pain: Secondary | ICD-10-CM

## 2017-09-14 DIAGNOSIS — R7303 Prediabetes: Secondary | ICD-10-CM | POA: Diagnosis not present

## 2017-09-14 DIAGNOSIS — H6991 Unspecified Eustachian tube disorder, right ear: Secondary | ICD-10-CM

## 2017-09-14 DIAGNOSIS — M25562 Pain in left knee: Secondary | ICD-10-CM | POA: Diagnosis not present

## 2017-09-14 DIAGNOSIS — M7750 Other enthesopathy of unspecified foot: Secondary | ICD-10-CM | POA: Diagnosis not present

## 2017-09-14 DIAGNOSIS — M5442 Lumbago with sciatica, left side: Principal | ICD-10-CM

## 2017-09-14 DIAGNOSIS — L309 Dermatitis, unspecified: Secondary | ICD-10-CM

## 2017-09-14 DIAGNOSIS — M6281 Muscle weakness (generalized): Secondary | ICD-10-CM | POA: Diagnosis not present

## 2017-09-14 LAB — GLUCOSE, POCT (MANUAL RESULT ENTRY): POC Glucose: 105 mg/dl — AB (ref 70–99)

## 2017-09-14 MED ORDER — GABAPENTIN 300 MG PO CAPS: 300.0000 mg | ORAL_CAPSULE | Freq: Every day | ORAL | 0 refills | Status: DC

## 2017-09-14 MED ORDER — TRIAMCINOLONE ACETONIDE 0.5 % EX CREA: 1.0000 "application " | TOPICAL_CREAM | Freq: Two times a day (BID) | CUTANEOUS | 0 refills | Status: AC

## 2017-09-14 MED ORDER — NAPROXEN 500 MG PO TABS: 500.0000 mg | ORAL_TABLET | Freq: Two times a day (BID) | ORAL | 0 refills | Status: DC

## 2017-09-14 MED ORDER — FLUTICASONE PROPIONATE 50 MCG/ACT NA SUSP: 2.0000 | Freq: Every day | NASAL | 2 refills | Status: DC

## 2017-09-14 NOTE — Patient Instructions (Signed)
Disfuncin de la trompa de Eustaquio (Eustachian Tube Dysfunction) La trompa de Eustaquio conecta el odo medio con la parte posterior de la nariz. Regula la presin de aire en el odo medio al permitir que el aire circule por el odo y la nariz. Tambin ayuda a drenar el lquido del espacio del odo medio. Cuando la trompa de Eustaquio no funciona bien, se puede producir una acumulacin de presin de aire, lquido o ambos en el odo medio. La disfuncin de la trompa de Eustaquio puede afectar a uno o a los dos odos. CAUSAS Esta afeccin ocurre cuando la trompa de Eustaquio se bloquea o no puede abrirse normalmente. Puede ser consecuencia de lo siguiente:  Infecciones en los odos.  Resfriados y otras infecciones de las vas respiratorias superiores.  Alergias.  Irritacin, por ejemplo, por el humo del cigarrillo o los cidos del estmago que vuelven hacia el esfago (reflujo gastroesofgico).  Cambios sbitos en la presin del aire, como cuando baja un avin.  Crecimientos anormales en la nariz o la garganta, como plipos nasales, tumores o tejido engrosado en la parte posterior de la garganta (adenoides). FACTORES DE RIESGO Puede ser ms probable que esta afeccin se desarrolle en las personas que fuman y las personas que tienen sobrepeso. Tambin es ms probable que la disfuncin de la trompa de Eustaquio se produzca en los nios, especialmente los nios que presentan lo siguiente:  Ciertos defectos congnitos en la boca, como fisura del paladar.  Amgdalas y adenoides grandes. SNTOMAS Los sntomas de esta afeccin pueden incluir lo siguiente:  Sensacin de que el odo est tapado.  Dolor de odo.  Ruidos como chasquidos o crujidos en el odo.  Zumbidos en el odo.  Prdida auditiva.  Prdida del equilibrio. Los sntomas pueden empeorar cuando la presin que tiene a su alrededor cambia, como cuando viaja a una zona de mayor altura o en avin. DIAGNSTICO Esta afeccin se  puede diagnosticar en funcin de lo siguiente:  Sus sntomas.  Un examen fsico del odo, de la nariz y de la garganta.  Pruebas en las que se determine lo siguiente: ? El movimiento de la membrana del tmpano (timpanograma). ? La audicin (audiometra). TRATAMIENTO El tratamiento depende de la causa y de la gravedad de la afeccin. Si los sntomas son leves, es posible que pueda aliviarlos haciendo circular aire ("destapar") dentro de los odos. Si tiene sntomas de lquido en los odos, el tratamiento puede incluir lo siguiente:  Descongestivos.  Antihistamnicos.  Aerosoles nasales o gotas para los odos que contengan medicamentos para reducir la hinchazn (corticoides). En algunos casos, puede necesitar un procedimiento para drenar el lquido de la membrana del tmpano (miringotoma). En este procedimiento, se coloca un tubo pequeo en la membrana del tmpano para hacer lo siguiente:  Drenar el lquido.  Restablecer el aire en el espacio del odo medio. INSTRUCCIONES PARA EL CUIDADO EN EL HOGAR  Tome los medicamentos de venta libre y los recetados solamente como se lo haya indicado el mdico.  Utilice las tcnicas recomendadas por el mdico para ayudar a destapar los odos. Estas pueden incluir las siguientes: ? Masticar goma de mascar. ? Bostezos. ? Tragar vigorosamente con frecuencia. ? Cerrar la boca, taparse la nariz y soplar suavemente por la nariz como si tratara de soltar el aire.  No haga ninguna de estas cosas hasta que el mdico lo autorice: ? Viajar a grandes alturas. ? Viajar en avin. ? Trabajar en una cabina o una habitacin presurizada. ? Practicar buceo.  Mantener   secos los odos. Squese bien los odos despus de ducharse o darse un bao.  No fume.  Concurra a todas las visitas de control como se lo haya indicado el mdico. Esto es importante. SOLICITE ATENCIN MDICA SI:  Los sntomas no desaparecen despus del tratamiento.  Los sntomas regresan  despus del tratamiento.  No puede destaparse los odos.  Tiene los siguientes sntomas: ? Fiebre. ? Dolor en el odo. ? Dolor de cabeza o en el cuello. ? Hay lquido que sale del odo.  La audicin cambia de repente.  Se siente muy mareado.  Pierde el equilibrio. Esta informacin no tiene como fin reemplazar el consejo del mdico. Asegrese de hacerle al mdico cualquier pregunta que tenga. Document Released: 11/08/2014 Document Revised: 11/08/2014 Document Reviewed: 11/08/2014 Elsevier Interactive Patient Education  2018 [employer]vier [employer]  

## 2017-09-14 NOTE — Therapy (Signed)
[place], [place], [address] Phone: [phone]   Fax:  [phone]  Physical Therapy Treatment  Patient Details  Name: Kendra Rodriguez MRN: [id number] Date of Birth: Jan 27, 1966 Referring Provider: Basil Dess MD   Encounter Date: 09/14/2017  PT End of Session - 09/14/17 1428    Visit Number  6    Number of Visits  13    Date for PT Re-Evaluation  10/05/17    PT Start Time  1332    PT Stop Time  1415    PT Time Calculation (min)  43 min    Activity Tolerance  Patient tolerated treatment well    Behavior During Therapy  [facility] for tasks assessed/performed       Past Medical History:  Diagnosis Date  . Acute sinusitis   . Allergy   . Anxiety   . Depression    NO MEDICATIONS - DOING OK NOW  . Dizziness   . Ear ache    RIGHT --HX OF MULTIPLE EAR INFECTIONS AS A CHILD   . GERD (gastroesophageal reflux disease)   . Headache(784.0)    MIGRAINES  . Leg pain, left    with swelling  . Lumbar stenosis    PAIN BACK AND LEFT LEG AND NUMBNESS LEG AND FOOT  . Peripheral vascular disease (HCC)    HX OF TREATMENT FOR VARICOSE VEINS RIGHT LEG  . Thyroid disease    TOLD BLOOD LEVELS SLIGHTLY ABNORMAL - BUT NOT REQUIRED MEDICATION    Past Surgical History:  Procedure Laterality Date  . ABDOMINAL HYSTERECTOMY    . bladder lift     AT SAME TIME OF HYSTERECTOMY  . CHOLECYSTECTOMY      There were no vitals filed for this visit.  Subjective Assessment - 09/14/17 1337    Subjective  Saw Md about her foot pain .  He said there was inflamation.  She is new meds,  does not know the names .  Back has a pinch low back,    Currently in Pain?  Yes    Pain Score  4     Pain Descriptors / Indicators  -- pinch    Pain Frequency  Intermittent    Aggravating Factors   sitting down a long time    Pain Relieving Factors  exercises    Multiple Pain Sites  -- right foot  5/10 MD is addressing with meds.     Pain Score  0     Pain Location  Knee    Pain Orientation  Right    Pain Relieving Factors  exercise                      [facility] Adult PT Treatment/Exercise - 09/14/17 0001      Therapeutic Activites    Therapeutic Activities  ADL's;Lifting    ADL's  laundry simulation,  she has been reaching into washing machine with 2 hands,  she has been bending over the dryer with hips flexed.      Lifting  ball from mat  carry ball      Lumbar Exercises: Stretches   Single Knee to Chest Stretch  3 reps;30 seconds rt and left  pin decreased     Pelvic Tilt  -- 8 X noted a pnch  so stopped and stretched      Lumbar Exercises: Seated   Other Seated Lumbar Exercises  seated on dyna disc pelvic tilt 2 x 10,  marching while sitting on dyna disc 2 x 10 with ADIM, horizontal lift/ chop 2 x 10 bil with red theraband while  left LE harder to lift      Lumbar Exercises: Supine   Isometric Hip Flexion  5 reps each leg    Large Ball Abdominal Isometric  10 reps    Large Ball Oblique Isometric  10 reps each hand      Knee/Hip Exercises: Standing   Hip ADduction  20 reps red band    Hip Extension  20 reps;1 set each leg,  red band,  , cued to avoid arch of back.               PT Education - 09/14/17 1427    Education provided  Yes    Education Details  Lift,  carry,  squat to pick up,  Laundry  ADL's    Person(s) Educated  Patient [place] child     Methods  Explanation;Demonstration;Verbal cues    Comprehension  Verbalized understanding;Returned demonstration       PT Short Term Goals - 09/14/17 1434      PT SHORT TERM GOAL #1   Title  pt to be I with inital HEP    Baseline  independent with exercises issued so sfar    Time  3    Period  Weeks    Status  On-going      PT SHORT TERM GOAL #2   Title  pt to verbalize and demo proper posture with sitting/ standing and lifting mechanics to prevent and reduce low back pain    Baseline  able to demo and verbal info for sitting posture.  Lifting  mechanics for ADL's education continued today.     Time  3    Period  Weeks    Status  Partially Met      PT SHORT TERM GOAL #3   Title  pt to report pain to </= 4/10 in the back with no refrral of pain down the LLE to demo improvement in condition    Baseline  pain inconsistant ,  intermitant into leg,  foot pain continues.     Time  3    Period  Weeks    Status  On-going        PT Long Term Goals - 08/24/17 1442      PT LONG TERM GOAL #1   Title  pt to increase low back flexion to >/= 75 degrees, and extension/ bil sidebending to >/= 20   degrees with </= 2/10 painto promote funcitonal mobility required for ADLs     Time  6    Period  Weeks    Status  New    Target Date  10/05/17      PT LONG TERM GOAL #2   Title  improve bil LE strength to >/= 4+/5 to promote hip stability and assist with maintain good form with lifting/ carrying mechanics     Time  6    Period  Weeks    Status  New    Target Date  10/05/17      PT LONG TERM GOAL #3   Title  pt to increase sitting/ standing and walking endurnace to >/=1 hour with </= 2/10 pain for functional community amb and ADLs     Time  6    Period  Weeks    Status  New    Target Date  10/05/17      PT  LONG TERM GOAL #4   Title  pt to increase FOTO score </= 43% limited to demo improvement in function     Time  6    Period  Weeks    Status  New    Target Date  10/05/17            Plan - 09/14/17 1430    Clinical Impression Statement  Knee pain seems to be resolved.  Patient requires ongoing education for ADL's.  Patient was able to demo good posture after instruction with lifting light objects.  She should be able to practice with 10 LB objects next.  No pain at end of session in back. She declined the need for modalities.  MD is addressing foot pain with medication. STG #2 partially met.    PT Treatment/Interventions  ADLs/Self Care Home Management;Patient/family education;Cryotherapy;Electrical Stimulation;Iontophoresis  38m/ml Dexamethasone;Moist Heat;Traction;Ultrasound;Therapeutic activities;Therapeutic exercise;Dry needling;Taping;Manual techniques;Passive range of motion;Neuromuscular re-education;Balance training    PT Next Visit Plan  Update HEP, core strengthening. modalities, ADL handouts (Spanish), Vastus lateralis manual, and VMO activsation techinques. trial McConnel taping, discuss DN.  assess metatarsal bar.  Practice lifting,  carrying 10 LBS    PT Home Exercise Plan  prone press-up, clams, supine marching. LTR, prone, standing hip abduction/ extension    Consulted and Agree with Plan of Care  Patient       Patient will benefit from skilled therapeutic intervention in order to improve the following deficits and impairments:     Visit Diagnosis: Chronic bilateral low back pain with left-sided sciatica  Chronic pain of left knee  Stiffness of left knee, not elsewhere classified  Muscle weakness (generalized)     Problem List Patient Active Problem List   Diagnosis Date Noted  . Prediabetes 10/17/2015  . Hypertriglyceridemia 10/17/2015  . Lumbar disc herniation 10/25/2014  . Spinal stenosis, lumbar region, with neurogenic claudication 10/24/2014  . Varicose veins of right leg with edema 01/30/2014    Bernie Fobes PTA 09/14/2017, 2:38 PM  [place] [address]Phone: [phone]  Fax:  [phone] Name: EAZURI BOZARDMRN: [id number] of Birth: [birth year]

## 2017-09-14 NOTE — Progress Notes (Signed)
Subjective:  Patient ID: Kendra Rodriguez, female    DOB: [birth year]  Age: 51 y.o. MRN: [id number]  CC: foot pain and otalgia  HPI Kendra Rodriguez a 51 y.o.femalewith a medical history of anxiety, depression, OAB, GERD, migraines, varicose veins, lumbar stenosis s/p surgical decompression 10/24/14, s/p cholecystectomy, and s/p hypsterectomy, presents with right ear pain and right foot pain. Right ear pain addressed on 08/03/17 and diagnosed with disorder of the right eustachian tube. Prescribed prednisone, fluticasone, and cetirizine. Took medications with significant relief of symptoms. Only had ringing in the right ear. Has run out of fluticasone and prednisone. Continues to take cetirizine. Does not endorse fever, malaise, chills, rash, headache, loss of audition, dizziness, or other focal neurological deficit.     Right foot with lateral pain along the 5th digit since approximately 10 days ago. Has pain and burning sensation along the right 5th metacarpal and digit with radiculopathy along the peroneal distribution.        Outpatient Medications Prior to Visit  Medication Sig Dispense Refill  . cetirizine (ZYRTEC) 10 MG tablet Take 1 tablet (10 mg total) by mouth daily. 30 tablet 11  . dexlansoprazole (DEXILANT) 60 MG capsule Take 60 mg by mouth every morning.     . fluticasone (FLONASE) 50 MCG/ACT nasal spray Place 2 sprays into both nostrils daily. 16 g 2  . gabapentin (NEURONTIN) 100 MG capsule Take 1 capsule (100 mg total) by mouth 3 (three) times daily. 90 capsule 3  . levocetirizine (XYZAL) 5 MG tablet Take 1 tablet (5 mg total) by mouth every evening. 30 tablet 0  . metFORMIN (GLUCOPHAGE) 850 MG tablet Take 850 mg by mouth 2 (two) times daily with a meal.    . montelukast (SINGULAIR) 10 MG tablet Take 1 tablet (10 mg total) by mouth at bedtime. 30 tablet 3  . naproxen (NAPROSYN) 500 MG tablet Take 1 tablet (500 mg total) by mouth 2 (two) times daily with a meal. 30 tablet 1  .  predniSONE (DELTASONE) 20 MG tablet Take 2 tablets (40 mg total) by mouth daily with breakfast. 10 tablet 0  . rosuvastatin (CRESTOR) 10 MG tablet Take 10 mg by mouth daily.    . sucralfate (CARAFATE) 1 GM/10ML suspension Take 1 g by mouth 4 (four) times daily -  with meals and at bedtime.     No facility-administered medications prior to visit.      ROS Review of Systems  Constitutional: Negative for chills, fever and malaise/fatigue.  HENT: Positive for ear pain.   Eyes: Negative for blurred vision.  Respiratory: Negative for shortness of breath.   Cardiovascular: Negative for chest pain and palpitations.  Gastrointestinal: Negative for abdominal pain and nausea.  Genitourinary: Negative for dysuria and hematuria.  Musculoskeletal: Positive for joint pain. Negative for myalgias.  Skin: Positive for rash.  Neurological: Negative for tingling and headaches.  Psychiatric/Behavioral: Negative for depression. The patient is not nervous/anxious.     Objective:  Ht 5\' 2"  (1.575 m)   Wt 193 lb (87.5 kg)   BMI 35.30 kg/m   BP/Weight 09/14/2017 08/14/2017 08/03/2017  Systolic BP - 130 101  Diastolic BP - 77 69  Wt. (Lbs) 193 191 192  BMI 35.3 34.93 35.12  Some encounter information is confidential and restricted. Go to Review Flowsheets activity to see all data.      Physical Exam  Constitutional: She is oriented to person, place, and time.  Well developed, overweight, NAD, polite  HENT:  Head:  Normocephalic and atraumatic.  Right TM with scarring but no erythema, bulging, or retraction.  Eyes: No scleral icterus.  Neck: Normal range of motion. Neck supple. No thyromegaly present.  Cardiovascular: Normal rate, regular rhythm and normal heart sounds.  Pulmonary/Chest: Effort normal and breath sounds normal.  Abdominal: Soft. Bowel sounds are normal. There is no tenderness.  Musculoskeletal: She exhibits no edema.  Right fifth digit with no erythema, edema, ecchymosis, or  deformity; however, there is some tenderness to palpation.  Neurological: She is alert and oriented to person, place, and time. No cranial nerve deficit. Coordination normal.  Skin: Skin is warm and dry.  Interdigital erythema with small papules in the right hand.  Psychiatric: She has a normal mood and affect. Her behavior is normal. Thought content normal.  Vitals reviewed.    Assessment & Plan:    1. Disorder of right eustachian tube - Continue on cetirizine - Refill fluticasone (FLONASE) 50 MCG/ACT nasal spray; Place 2 sprays daily into both nostrils.  Dispense: 16 g; Refill: 2 - Begin Naproxen 500 mg BID PRN  2. Eczema, unspecified type - Begin Triamcinolone 0.5% cream BID x7 days  3. Tendonitis of foot - Begin Naproxen 500 mg BID  4. Paresthesia of right foot - Begin gabapentin 300 mg qhs  5. Prediabetes - Glucose (CBG) 105 in clinic today.     Meds ordered this encounter  Medications  . naproxen (NAPROSYN) 500 MG tablet    Sig: Take 1 tablet (500 mg total) 2 (two) times daily with a meal by mouth.    Dispense:  30 tablet    Refill:  0    Order Specific Question:   Supervising Provider    Answer:   Quentin AngstJEGEDE, OLUGBEMIGA E [id number]]  . triamcinolone cream (KENALOG) 0.5 %    Sig: Apply 1 application 2 (two) times daily for 7 days topically. Do not use for more than 7 consecutive days without medical approval.    Dispense:  30 g    Refill:  0    Order Specific Question:   Supervising Provider    Answer:   Quentin AngstJEGEDE, OLUGBEMIGA E [id number]]  . gabapentin (NEURONTIN) 300 MG capsule    Sig: Take 1 capsule (300 mg total) at bedtime by mouth.    Dispense:  30 capsule    Refill:  0    Order Specific Question:   Supervising Provider    Answer:   Quentin AngstJEGEDE, OLUGBEMIGA E [id number]]  . fluticasone (FLONASE) 50 MCG/ACT nasal spray    Sig: Place 2 sprays daily into both nostrils.    Dispense:  16 g    Refill:  2    Order Specific Question:   Supervising Provider    Answer:    Quentin AngstJEGEDE, OLUGBEMIGA E [id number]]    Follow-up: 3 months for prediabetes.  [name]

## 2017-09-16 ENCOUNTER — Ambulatory Visit: Payer: BLUE CROSS/BLUE SHIELD | Admitting: Physical Therapy

## 2017-09-16 ENCOUNTER — Encounter: Payer: Self-pay | Admitting: Physical Therapy

## 2017-09-16 DIAGNOSIS — M5442 Lumbago with sciatica, left side: Principal | ICD-10-CM

## 2017-09-16 DIAGNOSIS — G8929 Other chronic pain: Secondary | ICD-10-CM

## 2017-09-16 DIAGNOSIS — M6281 Muscle weakness (generalized): Secondary | ICD-10-CM

## 2017-09-16 DIAGNOSIS — M25662 Stiffness of left knee, not elsewhere classified: Secondary | ICD-10-CM | POA: Diagnosis not present

## 2017-09-16 DIAGNOSIS — M25562 Pain in left knee: Secondary | ICD-10-CM

## 2017-09-16 NOTE — Patient Instructions (Addendum)

## 2017-09-16 NOTE — Therapy (Signed)
[place], [place], [address] Phone: [phone]   Fax:  [phone]  Physical Therapy Treatment  Patient Details  Name: Kendra Rodriguez MRN: [id number] Date of Birth: [birth year] Referring Provider: Basil Dess MD   Encounter Date: 09/16/2017  PT End of Session - 09/16/17 1514    Visit Number  7    Number of Visits  13    Date for PT Re-Evaluation  10/05/17    PT Start Time  1501    PT Stop Time  4010    PT Time Calculation (min)  47 min    Activity Tolerance  Patient tolerated treatment well    Behavior During Therapy  [facility] for tasks assessed/performed       Past Medical History:  Diagnosis Date  . Acute sinusitis   . Allergy   . Anxiety   . Depression    NO MEDICATIONS - DOING OK NOW  . Dizziness   . Ear ache    RIGHT --HX OF MULTIPLE EAR INFECTIONS AS A CHILD   . GERD (gastroesophageal reflux disease)   . Headache(784.0)    MIGRAINES  . Leg pain, left    with swelling  . Lumbar stenosis    PAIN BACK AND LEFT LEG AND NUMBNESS LEG AND FOOT  . Peripheral vascular disease (HCC)    HX OF TREATMENT FOR VARICOSE VEINS RIGHT LEG  . Thyroid disease    TOLD BLOOD LEVELS SLIGHTLY ABNORMAL - BUT NOT REQUIRED MEDICATION    Past Surgical History:  Procedure Laterality Date  . ABDOMINAL HYSTERECTOMY    . bladder lift     AT SAME TIME OF HYSTERECTOMY  . CHOLECYSTECTOMY      There were no vitals filed for this visit.  Subjective Assessment - 09/16/17 1455    Subjective  "The low back is still about a 3/10 pain today, the knee is still going well"     Currently in Pain?  Yes    Pain Score  3     Pain Location  Back    Pain Type  Chronic pain    Pain Onset  More than a month ago    Pain Frequency  Intermittent    Pain Score  0    Pain Orientation  Right    Pain Type  Chronic pain    Pain Onset  More than a month ago    Pain Frequency  Intermittent                      [facility]  Adult PT Treatment/Exercise - 09/16/17 1517      Self-Care   Self-Care  Posture    ADL's  proper lifting/ carrying mechanics to prevent/ reduce low back pain      Lumbar Exercises: Stretches   Lower Trunk Rotation  -- 1 x 10      Lumbar Exercises: Supine   Bent Knee Raise  20 reps with ADIM      Manual Therapy   Manual Therapy  Soft tissue mobilization;Muscle Energy Technique    Joint Mobilization  grade 3 PA L1-L5    Soft tissue mobilization  IASTM over bil lumbar paraspinals with focus on the R    Muscle Energy Technique  resisted R hip flexion 5 x 10 sec in supine       Trigger Point Dry Needling - 09/16/17 1515    Consent Given?  Yes    Education Handout Provided  Yes    Muscles Treated Upper Body  Longissimus    Longissimus Response  Twitch response elicited;Palpable increased muscle length R L1-L3 multfidus           PT Education - 09/16/17 1513    Education provided  Yes    Education Details  muscle anatomay and referral patterns. What TPDN is benefits, what to expect and after care.     Person(s) Educated  Patient    Methods  Explanation    Comprehension  Verbalized understanding       PT Short Term Goals - 09/14/17 1434      PT SHORT TERM GOAL #1   Title  pt to be I with inital HEP    Baseline  independent with exercises issued so sfar    Time  3    Period  Weeks    Status  On-going      PT SHORT TERM GOAL #2   Title  pt to verbalize and demo proper posture with sitting/ standing and lifting mechanics to prevent and reduce low back pain    Baseline  able to demo and verbal info for sitting posture.  Lifting mechanics for ADL's education continued today.     Time  3    Period  Weeks    Status  Partially Met      PT SHORT TERM GOAL #3   Title  pt to report pain to </= 4/10 in the back with no refrral of pain down the LLE to demo improvement in condition    Baseline  pain inconsistant ,  intermitant into leg,  foot pain continues.     Time  3     Period  Weeks    Status  On-going        PT Long Term Goals - 08/24/17 1442      PT LONG TERM GOAL #1   Title  pt to increase low back flexion to >/= 75 degrees, and extension/ bil sidebending to >/= 20   degrees with </= 2/10 painto promote funcitonal mobility required for ADLs     Time  6    Period  Weeks    Status  New    Target Date  10/05/17      PT LONG TERM GOAL #2   Title  improve bil LE strength to >/= 4+/5 to promote hip stability and assist with maintain good form with lifting/ carrying mechanics     Time  6    Period  Weeks    Status  New    Target Date  10/05/17      PT LONG TERM GOAL #3   Title  pt to increase sitting/ standing and walking endurnace to >/=1 hour with </= 2/10 pain for functional community amb and ADLs     Time  6    Period  Weeks    Status  New    Target Date  10/05/17      PT LONG TERM GOAL #4   Title  pt to increase FOTO score </= 43% limited to demo improvement in function     Time  6    Period  Weeks    Status  New    Target Date  10/05/17            Plan - 09/16/17 1730    Clinical Impression Statement  pt reports pain inthe low back today at 3/10. Educated and performed DN over R lumbar paraspinals/ multifidus  followed with manual techinques. conintued posteriorly rotated innominate treatment on the L with MET for activating R hip flexion. she performed core strenghtening well with minimal report of pain. educated about posture and proper form with lifting/ carrying mechanics. Post session she reported no pain.     PT Next Visit Plan  Update HEP, assess response to DN, ADL handouts (Spanish), Vastus lateralis manual, and VMO activsation techinques. trial McConnel taping, discuss DN.  assess metatarsal bar.  Practice lifting,  carrying 10 LBS    PT Home Exercise Plan  prone press-up, clams, supine marching. LTR, prone, standing hip abduction/ extension    Consulted and Agree with Plan of Care  Patient       Patient will benefit  from skilled therapeutic intervention in order to improve the following deficits and impairments:  Pain, Improper body mechanics, Postural dysfunction, Decreased range of motion, Decreased strength, Decreased activity tolerance, Decreased endurance, Increased fascial restricitons  Visit Diagnosis: Chronic bilateral low back pain with left-sided sciatica  Chronic pain of left knee  Stiffness of left knee, not elsewhere classified  Muscle weakness (generalized)     Problem List Patient Active Problem List   Diagnosis Date Noted  . Prediabetes 10/17/2015  . Hypertriglyceridemia 10/17/2015  . Lumbar disc herniation 10/25/2014  . Spinal stenosis, lumbar region, with neurogenic claudication 10/24/2014  . Varicose veins of right leg with edema 01/30/2014   Starr Lake PT, DPT, LAT, ATC  09/16/17  5:35 PM      [place], [place], [address] Phone: [phone]   Fax:  [phone]  Name: STACIA FEAZELL MRN: [id number] Date of Birth: 1966-05-11

## 2017-09-21 ENCOUNTER — Ambulatory Visit: Payer: BLUE CROSS/BLUE SHIELD | Admitting: Physical Therapy

## 2017-09-21 ENCOUNTER — Encounter: Payer: Self-pay | Admitting: Physical Therapy

## 2017-09-21 DIAGNOSIS — M25562 Pain in left knee: Secondary | ICD-10-CM

## 2017-09-21 DIAGNOSIS — M5442 Lumbago with sciatica, left side: Secondary | ICD-10-CM | POA: Diagnosis not present

## 2017-09-21 DIAGNOSIS — G8929 Other chronic pain: Secondary | ICD-10-CM | POA: Diagnosis not present

## 2017-09-21 DIAGNOSIS — M6281 Muscle weakness (generalized): Secondary | ICD-10-CM

## 2017-09-21 DIAGNOSIS — M25662 Stiffness of left knee, not elsewhere classified: Secondary | ICD-10-CM | POA: Diagnosis not present

## 2017-09-21 NOTE — Therapy (Signed)
[place], [place], [address] Phone: [phone]   Fax:  [phone]  Physical Therapy Treatment  Patient Details  Name: Kendra Rodriguez MRN: [id number] Date of Birth: [birth year] Referring Provider: Basil Dess MD   Encounter Date: 09/21/2017  PT End of Session - 09/21/17 1413    Visit Number  8    Number of Visits  13    Date for PT Re-Evaluation  10/05/17    PT Start Time  1332    PT Stop Time  1425    PT Time Calculation (min)  53 min    Activity Tolerance  Patient tolerated treatment well       Past Medical History:  Diagnosis Date  . Acute sinusitis   . Allergy   . Anxiety   . Depression    NO MEDICATIONS - DOING OK NOW  . Dizziness   . Ear ache    RIGHT --HX OF MULTIPLE EAR INFECTIONS AS A CHILD   . GERD (gastroesophageal reflux disease)   . Headache(784.0)    MIGRAINES  . Leg pain, left    with swelling  . Lumbar stenosis    PAIN BACK AND LEFT LEG AND NUMBNESS LEG AND FOOT  . Peripheral vascular disease (HCC)    HX OF TREATMENT FOR VARICOSE VEINS RIGHT LEG  . Thyroid disease    TOLD BLOOD LEVELS SLIGHTLY ABNORMAL - BUT NOT REQUIRED MEDICATION    Past Surgical History:  Procedure Laterality Date  . ABDOMINAL HYSTERECTOMY    . bladder lift     AT SAME TIME OF HYSTERECTOMY  . CHOLECYSTECTOMY    . COMPLETE LUMBAR DECOMPRESSION L4-L5 CENTRAL AND MICRODISCECTOMY L4-L5 LEFT Left 10/24/2014   Performed by Tobi Bastos, MD at [facility]    There were no vitals filed for this visit.  Subjective Assessment - 09/21/17 1335    Subjective  No pain    feels pressure/  weight in low back ever since the DN    Currently in Pain?  No/denies    Pain Location  Back    Pain Orientation  Right;Left;Lower    Pain Descriptors / Indicators  -- pressure,  weight    Pain Type  Chronic pain    Pain Radiating Towards  no    Aggravating Factors   sitting down tpoo long,      Pain Relieving Factors   DN,  exrecise  Cat camel    Multiple Pain Sites  -- lateral foot pain 6/10  taking pain meds for.                      [facility] Treatment/Exercise - 09/21/17 0001      Lumbar Exercises: Seated   Other Seated Lumbar Exercises  on green ball pelvic mobility stiff,  stabilization bounce, reach march SBA        Lumbar Exercises: Supine   Clam  10 reps green band,  2 sets,  also ball squeeze with abd engagement    Bridge  20 reps      Lumbar Exercises: Quadruped   Other Quadruped Lumbar Exercises  cat/camel.  mirror needed for cat,  ROM limited,  Camel WNL.      Knee/Hip Exercises: Stretches   Passive Hamstring Stretch  3 reps;30 seconds    Quad Stretch  3 reps;30 seconds right,    Hip Flexor Stretch  3 reps;30 seconds riight      Moist  Heat Therapy   Number Minutes Moist Heat  10 Minutes    Moist Heat Location  Lumbar Spine               PT Short Term Goals - 09/21/17 1415      PT SHORT TERM GOAL #1   Title  pt to be I with inital HEP    Baseline  independent    Time  3    Period  Weeks    Status  Achieved      PT SHORT TERM GOAL #2   Title  pt to verbalize and demo proper posture with sitting/ standing and lifting mechanics to prevent and reduce low back pain    Baseline  doing at home without pain increase ,  consistant?    Time  3    Period  Weeks    Status  Partially Met      PT SHORT TERM GOAL #3   Title  pt to report pain to </= 4/10 in the back with no refrral of pain down the LLE to demo improvement in condition    Baseline  no pain today    Time  3    Period  Weeks    Status  Partially Met        PT Long Term Goals - 08/24/17 1442      PT LONG TERM GOAL #1   Title  pt to increase low back flexion to >/= 75 degrees, and extension/ bil sidebending to >/= 20   degrees with </= 2/10 painto promote funcitonal mobility required for ADLs     Time  6    Period  Weeks    Status  New    Target Date  10/05/17      PT LONG TERM GOAL  #2   Title  improve bil LE strength to >/= 4+/5 to promote hip stability and assist with maintain good form with lifting/ carrying mechanics     Time  6    Period  Weeks    Status  New    Target Date  10/05/17      PT LONG TERM GOAL #3   Title  pt to increase sitting/ standing and walking endurnace to >/=1 hour with </= 2/10 pain for functional community amb and ADLs     Time  6    Period  Weeks    Status  New    Target Date  10/05/17      PT LONG TERM GOAL #4   Title  pt to increase FOTO score </= 43% limited to demo improvement in function     Time  6    Period  Weeks    Status  New    Target Date  10/05/17            Plan - 09/21/17 1414    Clinical Impression Statement  No pain today,  just pressure in low back.  stabilization continued with patient getting good height with bridge.  DN was helpful.  STG#1 met.  STG#2, #3 partially met.    PT Treatment/Interventions  ADLs/Self Care Home Management;Patient/family education;Cryotherapy;Electrical Stimulation;Iontophoresis 52m/ml Dexamethasone;Moist Heat;Traction;Ultrasound;Therapeutic activities;Therapeutic exercise;Dry needling;Taping;Manual techniques;Passive range of motion;Neuromuscular re-education;Balance training    PT Next Visit Plan  Update HEP, , ADL handouts (Spanish), Vastus lateralis manual, and VMO activsation techinques. trial McConnel taping, discuss DN.  assess metatarsal bar.  Practice lifting,  carrying 10 LBS    PT Home Exercise Plan  prone  press-up, clams, supine marching. LTR, prone, standing hip abduction/ extension    Consulted and Agree with Plan of Care  Patient       Patient will benefit from skilled therapeutic intervention in order to improve the following deficits and impairments:     Visit Diagnosis: Chronic bilateral low back pain with left-sided sciatica  Chronic pain of left knee  Muscle weakness (generalized)     Problem List Patient Active Problem List   Diagnosis Date Noted  .  Prediabetes 10/17/2015  . Hypertriglyceridemia 10/17/2015  . Lumbar disc herniation 10/25/2014  . Spinal stenosis, lumbar region, with neurogenic claudication 10/24/2014  . Varicose veins of right leg with edema 01/30/2014    HARRIS,KAREN PTA 09/21/2017, 2:17 PM  [place], [place], [address] Phone: [phone]   Fax:  [phone]  Name: Kendra Rodriguez MRN: [id number] Date of Birth: December 21, 1965

## 2017-09-23 ENCOUNTER — Encounter: Payer: Self-pay | Admitting: Physical Therapy

## 2017-09-23 ENCOUNTER — Ambulatory Visit: Payer: BLUE CROSS/BLUE SHIELD | Admitting: Physical Therapy

## 2017-09-23 DIAGNOSIS — M5442 Lumbago with sciatica, left side: Secondary | ICD-10-CM | POA: Diagnosis not present

## 2017-09-23 DIAGNOSIS — M25562 Pain in left knee: Secondary | ICD-10-CM

## 2017-09-23 DIAGNOSIS — G8929 Other chronic pain: Secondary | ICD-10-CM

## 2017-09-23 DIAGNOSIS — M6281 Muscle weakness (generalized): Secondary | ICD-10-CM

## 2017-09-23 DIAGNOSIS — M25662 Stiffness of left knee, not elsewhere classified: Secondary | ICD-10-CM | POA: Diagnosis not present

## 2017-09-23 NOTE — Therapy (Signed)
[place], [place], [address] Phone: [phone]   Fax:  [phone]  Physical Therapy Treatment  Patient Details  Name: Kendra Rodriguez MRN: [id number] Date of Birth: [birth year] Referring Provider: Basil Dess MD   Encounter Date: 09/23/2017  PT End of Session - 09/23/17 1548    Visit Number  9    Number of Visits  13    Date for PT Re-Evaluation  10/05/17    PT Start Time  1501    PT Stop Time  1547    PT Time Calculation (min)  46 min    Activity Tolerance  Patient tolerated treatment well    Behavior During Therapy  [facility] for tasks assessed/performed       Past Medical History:  Diagnosis Date  . Acute sinusitis   . Allergy   . Anxiety   . Depression    NO MEDICATIONS - DOING OK NOW  . Dizziness   . Ear ache    RIGHT --HX OF MULTIPLE EAR INFECTIONS AS A CHILD   . GERD (gastroesophageal reflux disease)   . Headache(784.0)    MIGRAINES  . Leg pain, left    with swelling  . Lumbar stenosis    PAIN BACK AND LEFT LEG AND NUMBNESS LEG AND FOOT  . Peripheral vascular disease (HCC)    HX OF TREATMENT FOR VARICOSE VEINS RIGHT LEG  . Thyroid disease    TOLD BLOOD LEVELS SLIGHTLY ABNORMAL - BUT NOT REQUIRED MEDICATION    Past Surgical History:  Procedure Laterality Date  . ABDOMINAL HYSTERECTOMY    . bladder lift     AT SAME TIME OF HYSTERECTOMY  . CHOLECYSTECTOMY    . LUMBAR LAMINECTOMY/DECOMPRESSION MICRODISCECTOMY Left 10/24/2014   Procedure: COMPLETE LUMBAR DECOMPRESSION L4-L5 CENTRAL AND MICRODISCECTOMY L4-L5 LEFT;  Surgeon: Tobi Bastos, MD;  Location: [facility];  Service: Orthopedics;  Laterality: Left;    There were no vitals filed for this visit.  Subjective Assessment - 09/23/17 1459    Subjective  "I am feeling heaviness in the back but no pain"     Currently in Pain?  No/denies    Pain Score  0-No pain    Pain Location  Back    Pain Descriptors / Indicators  Heaviness                       [facility] Adult PT Treatment/Exercise - 09/23/17 1530      Lumbar Exercises: Supine   Dead Bug  5 reps 15 sec hold with ball between hands and knees      Knee/Hip Exercises: Standing   Hip Abduction  20 reps;2 sets;Both;Knee straight    Hip Extension  Stengthening;Both;2 sets;20 reps;Knee straight    Other Standing Knee Exercises  1 x 20 with yellow theraband, with tactile cues to touch table for appropriate height      Knee/Hip Exercises: Seated   Sit to Sand  2 sets;15 reps;without UE support with blue theraband around the knees for glute med      Manual Therapy   Manual Therapy  Myofascial release    Joint Mobilization  grade 3 PA L1-L5, long axis distraction grade 5 LLE    Soft tissue mobilization  IASTM over bil lumbar paraspinals with focus on the R    Myofascial Release  fascial stretching/ rolling bil       Trigger Point Dry Needling - 09/23/17 1508    Consent Given?  Yes    Education Handout Provided  Yes    Muscles Treated Upper Body  Longissimus    Longissimus Response  Twitch response elicited;Palpable increased muscle length L1,L5 bil multifidus             PT Short Term Goals - 09/21/17 1415      PT SHORT TERM GOAL #1   Title  pt to be I with inital HEP    Baseline  independent    Time  3    Period  Weeks    Status  Achieved      PT SHORT TERM GOAL #2   Title  pt to verbalize and demo proper posture with sitting/ standing and lifting mechanics to prevent and reduce low back pain    Baseline  doing at home without pain increase ,  consistant?    Time  3    Period  Weeks    Status  Partially Met      PT SHORT TERM GOAL #3   Title  pt to report pain to </= 4/10 in the back with no refrral of pain down the LLE to demo improvement in condition    Baseline  no pain today    Time  3    Period  Weeks    Status  Partially Met        PT Long Term Goals - 08/24/17 1442      PT LONG TERM GOAL #1   Title  pt to increase  low back flexion to >/= 75 degrees, and extension/ bil sidebending to >/= 20   degrees with </= 2/10 painto promote funcitonal mobility required for ADLs     Time  6    Period  Weeks    Status  New    Target Date  10/05/17      PT LONG TERM GOAL #2   Title  improve bil LE strength to >/= 4+/5 to promote hip stability and assist with maintain good form with lifting/ carrying mechanics     Time  6    Period  Weeks    Status  New    Target Date  10/05/17      PT LONG TERM GOAL #3   Title  pt to increase sitting/ standing and walking endurnace to >/=1 hour with </= 2/10 pain for functional community amb and ADLs     Time  6    Period  Weeks    Status  New    Target Date  10/05/17      PT LONG TERM GOAL #4   Title  pt to increase FOTO score </= 43% limited to demo improvement in function     Time  6    Period  Weeks    Status  New    Target Date  10/05/17            Plan - 09/23/17 1549    Clinical Impression Statement  No report of pain today just heaviness inthe L low back. continued TPDN over the L paraspinals followed with IASTM techniques. She was able to perform core and hip strengthening with no reprot of pain and reported no heaviness in the low back. She declined modalities post session.     PT Treatment/Interventions  ADLs/Self Care Home Management;Patient/family education;Cryotherapy;Electrical Stimulation;Iontophoresis 59m/ml Dexamethasone;Moist Heat;Traction;Ultrasound;Therapeutic activities;Therapeutic exercise;Dry needling;Taping;Manual techniques;Passive range of motion;Neuromuscular re-education;Balance training    PT Next Visit Plan  Update HEP, , ADL handouts (Spanish), Vastus lateralis manual,  and VMO activsation techinques. trial McConnel taping, discuss DN.  assess metatarsal bar.  Practice lifting,  carrying 10 LBS, if she is doing well consider D/C    PT Home Exercise Plan  prone press-up, clams, supine marching. LTR, prone, standing hip abduction/ extension     Consulted and Agree with Plan of Care  Patient       Patient will benefit from skilled therapeutic intervention in order to improve the following deficits and impairments:  Pain, Improper body mechanics, Postural dysfunction, Decreased range of motion, Decreased strength, Decreased activity tolerance, Decreased endurance, Increased fascial restricitons  Visit Diagnosis: Chronic bilateral low back pain with left-sided sciatica  Chronic pain of left knee  Muscle weakness (generalized)  Stiffness of left knee, not elsewhere classified     Problem List Patient Active Problem List   Diagnosis Date Noted  . Prediabetes 10/17/2015  . Hypertriglyceridemia 10/17/2015  . Lumbar disc herniation 10/25/2014  . Spinal stenosis, lumbar region, with neurogenic claudication 10/24/2014  . Varicose veins of right leg with edema 01/30/2014   Starr Lake PT, DPT, LAT, ATC  09/23/17  3:53 PM      [facility] [address] [place], [place], [address] Phone: [phone]   Fax:  [phone]  Name: Kendra Rodriguez MRN: [id number] Date of Birth: 12/17/1965

## 2017-09-28 ENCOUNTER — Encounter: Payer: Self-pay | Admitting: Physical Therapy

## 2017-09-28 ENCOUNTER — Ambulatory Visit: Payer: BLUE CROSS/BLUE SHIELD | Admitting: Physical Therapy

## 2017-09-28 DIAGNOSIS — M5442 Lumbago with sciatica, left side: Principal | ICD-10-CM

## 2017-09-28 DIAGNOSIS — M6281 Muscle weakness (generalized): Secondary | ICD-10-CM

## 2017-09-28 DIAGNOSIS — G8929 Other chronic pain: Secondary | ICD-10-CM

## 2017-09-28 DIAGNOSIS — M25562 Pain in left knee: Secondary | ICD-10-CM | POA: Diagnosis not present

## 2017-09-28 DIAGNOSIS — M25662 Stiffness of left knee, not elsewhere classified: Secondary | ICD-10-CM | POA: Diagnosis not present

## 2017-09-28 NOTE — Patient Instructions (Addendum)
Bridge     Repeat _10_X.   1 - 2 X a dia  http://pm.exer.us/55   Copyright  VHI. All rights reserved.  Lower Abdominal (Dead Bug): Strength         Copyright  VHI. All rights reserved.  Hamstring Stretch     1 X dia 3 X 30 segundos.Calf Stretch    1 X dia 3X  30 segunds http://gt2.exer.us/478   Copyright  VHI. All rights reserved.    http://gt2.exer.us/280   Copyright  VHI. All rights reserved.  dEAD BUG ADDED FROM eX DRAWER\3-4 X A WEEK 5-10 X

## 2017-09-28 NOTE — Therapy (Addendum)
[place], [place], [address] Phone: [phone]   Fax:  [phone]  Physical Therapy Treatment / Discharge Summary  Patient Details  Name: Kendra Rodriguez MRN: [id number] Date of Birth: 28-Dec-1965 Referring Provider: Basil Dess MD   Encounter Date: 09/28/2017  PT End of Session - 09/28/17 1530    PT Start Time  1332    PT Stop Time  1416    PT Time Calculation (min)  44 min    Activity Tolerance  Patient tolerated treatment well    Behavior During Therapy  [facility] for tasks assessed/performed       Past Medical History:  Diagnosis Date  . Acute sinusitis   . Allergy   . Anxiety   . Depression    NO MEDICATIONS - DOING OK NOW  . Dizziness   . Ear ache    RIGHT --HX OF MULTIPLE EAR INFECTIONS AS A CHILD   . GERD (gastroesophageal reflux disease)   . Headache(784.0)    MIGRAINES  . Leg pain, left    with swelling  . Lumbar stenosis    PAIN BACK AND LEFT LEG AND NUMBNESS LEG AND FOOT  . Peripheral vascular disease (HCC)    HX OF TREATMENT FOR VARICOSE VEINS RIGHT LEG  . Thyroid disease    TOLD BLOOD LEVELS SLIGHTLY ABNORMAL - BUT NOT REQUIRED MEDICATION    Past Surgical History:  Procedure Laterality Date  . ABDOMINAL HYSTERECTOMY    . bladder lift     AT SAME TIME OF HYSTERECTOMY  . CHOLECYSTECTOMY    . LUMBAR LAMINECTOMY/DECOMPRESSION MICRODISCECTOMY Left 10/24/2014   Procedure: COMPLETE LUMBAR DECOMPRESSION L4-L5 CENTRAL AND MICRODISCECTOMY L4-L5 LEFT;  Surgeon: Tobi Bastos, MD;  Location: [facility];  Service: Orthopedics;  Laterality: Left;    There were no vitals filed for this visit.  Subjective Assessment - 09/28/17 1336    Subjective  Wed is my last treatment.  can i give me exercise so i can do at home and continue.  The Needles have helped me the most.    I do the exercises 20 minutes a day.  the cat is the hardest.     Patient is accompained by:  Interpreter    Pain Score   0-No pain has just a little bit,  not too much.    Pain Location  Back    Pain Radiating Towards  no    Aggravating Factors   sitting down too long    Pain Relieving Factors  DN,  exercise                      [facility] Adult PT Treatment/Exercise - 09/28/17 0001      Lumbar Exercises: Stretches   Press Ups  5 reps      Lumbar Exercises: Aerobic   Stationary Bike  nustep 5 minutes L4      Lumbar Exercises: Supine   Clam  10 reps green band    Dead Bug  5 reps HEP             PT Education - 09/28/17 1347    Education provided  Yes    Education Details  HEP    Person(s) Educated  Patient    Methods  Explanation;Demonstration;Verbal cues;Handout    Comprehension  Verbalized understanding;Returned demonstration       PT Short Term Goals - 09/21/17 1415      PT SHORT TERM GOAL #1  Title  pt to be I with inital HEP    Baseline  independent    Time  3    Period  Weeks    Status  Achieved      PT SHORT TERM GOAL #2   Title  pt to verbalize and demo proper posture with sitting/ standing and lifting mechanics to prevent and reduce low back pain    Baseline  doing at home without pain increase ,  consistant?    Time  3    Period  Weeks    Status  Partially Met      PT SHORT TERM GOAL #3   Title  pt to report pain to </= 4/10 in the back with no refrral of pain down the LLE to demo improvement in condition    Baseline  no pain today    Time  3    Period  Weeks    Status  Partially Met        PT Long Term Goals - 08/24/17 1442      PT LONG TERM GOAL #1   Title  pt to increase low back flexion to >/= 75 degrees, and extension/ bil sidebending to >/= 20   degrees with </= 2/10 painto promote funcitonal mobility required for ADLs     Time  6    Period  Weeks    Status  New    Target Date  10/05/17      PT LONG TERM GOAL #2   Title  improve bil LE strength to >/= 4+/5 to promote hip stability and assist with maintain good form with lifting/ carrying  mechanics     Time  6    Period  Weeks    Status  New    Target Date  10/05/17      PT LONG TERM GOAL #3   Title  pt to increase sitting/ standing and walking endurnace to >/=1 hour with </= 2/10 pain for functional community amb and ADLs     Time  6    Period  Weeks    Status  New    Target Date  10/05/17      PT LONG TERM GOAL #4   Title  pt to increase FOTO score </= 43% limited to demo improvement in function     Time  6    Period  Weeks    Status  New    Target Date  10/05/17            Plan - 09/28/17 1531    Clinical Impression Statement  No pain today.  patient relates her pain improvement mostly due to DN.  Focus was finalizing her HEP at her request.  She plans on one more visit prior to D/C. Patient is independent with all exercises issued today.    PT Next Visit Plan  Check HEP, , ADL handouts (Spanish),  Practice lifting,  carrying 10 LBS, if she is doing well patient requests D/C to HEP.    PT Home Exercise Plan  prone press-up, clams, supine marching. LTR, prone, standing hip abduction/ extension,  Dead Bug, calf, hamstring stretch, Bridge.    Consulted and Agree with Plan of Care  Patient       Patient will benefit from skilled therapeutic intervention in order to improve the following deficits and impairments:     Visit Diagnosis: Chronic bilateral low back pain with left-sided sciatica  Chronic pain of left knee  Muscle weakness (  generalized)  Stiffness of left knee, not elsewhere classified     Problem List Patient Active Problem List   Diagnosis Date Noted  . Prediabetes 10/17/2015  . Hypertriglyceridemia 10/17/2015  . Lumbar disc herniation 10/25/2014  . Spinal stenosis, lumbar region, with neurogenic claudication 10/24/2014  . Varicose veins of right leg with edema 01/30/2014    HARRIS,KAREN PTA 09/28/2017, 3:35 PM  [facility] [address] [place], [place], [address] Phone:  [phone]   Fax:  [phone]  Name: CLARITA MCELVAIN MRN: [id number] Date of Birth: 10-Oct-1966      PHYSICAL THERAPY DISCHARGE SUMMARY  Visits from Start of Care: 10  Current functional level related to goals / functional outcomes: See goals    Remaining deficits: Unknown   Education / Equipment: HEP, theraband  Plan: Patient agrees to discharge.  Patient goals were partially met. Patient is being discharged due to not returning since the last visit.  ?????

## 2017-09-30 ENCOUNTER — Ambulatory Visit: Payer: BLUE CROSS/BLUE SHIELD | Admitting: Physical Therapy

## 2017-11-10 ENCOUNTER — Ambulatory Visit (INDEPENDENT_AMBULATORY_CARE_PROVIDER_SITE_OTHER): Payer: BLUE CROSS/BLUE SHIELD | Admitting: Physician Assistant

## 2017-12-07 DIAGNOSIS — Z1231 Encounter for screening mammogram for malignant neoplasm of breast: Secondary | ICD-10-CM | POA: Diagnosis not present

## 2017-12-07 DIAGNOSIS — R252 Cramp and spasm: Secondary | ICD-10-CM | POA: Diagnosis not present

## 2017-12-07 DIAGNOSIS — R102 Pelvic and perineal pain: Secondary | ICD-10-CM | POA: Diagnosis not present

## 2017-12-07 DIAGNOSIS — Z139 Encounter for screening, unspecified: Secondary | ICD-10-CM | POA: Diagnosis not present

## 2017-12-07 DIAGNOSIS — Z6834 Body mass index (BMI) 34.0-34.9, adult: Secondary | ICD-10-CM | POA: Diagnosis not present

## 2017-12-07 DIAGNOSIS — Z01411 Encounter for gynecological examination (general) (routine) with abnormal findings: Secondary | ICD-10-CM | POA: Diagnosis not present

## 2017-12-28 DIAGNOSIS — R102 Pelvic and perineal pain: Secondary | ICD-10-CM | POA: Diagnosis not present

## 2017-12-28 DIAGNOSIS — N3281 Overactive bladder: Secondary | ICD-10-CM | POA: Diagnosis not present

## 2017-12-28 DIAGNOSIS — E559 Vitamin D deficiency, unspecified: Secondary | ICD-10-CM | POA: Diagnosis not present

## 2018-04-13 ENCOUNTER — Encounter (INDEPENDENT_AMBULATORY_CARE_PROVIDER_SITE_OTHER): Payer: Self-pay | Admitting: Physician Assistant

## 2018-04-13 ENCOUNTER — Other Ambulatory Visit: Payer: Self-pay

## 2018-04-13 ENCOUNTER — Ambulatory Visit (INDEPENDENT_AMBULATORY_CARE_PROVIDER_SITE_OTHER): Payer: BLUE CROSS/BLUE SHIELD | Admitting: Physician Assistant

## 2018-04-13 VITALS — BP 114/76 | HR 65 | Temp 97.8°F | Ht 62.0 in | Wt 195.6 lb

## 2018-04-13 DIAGNOSIS — R42 Dizziness and giddiness: Secondary | ICD-10-CM | POA: Diagnosis not present

## 2018-04-13 DIAGNOSIS — H9201 Otalgia, right ear: Secondary | ICD-10-CM | POA: Diagnosis not present

## 2018-04-13 DIAGNOSIS — E669 Obesity, unspecified: Secondary | ICD-10-CM

## 2018-04-13 DIAGNOSIS — Z6835 Body mass index (BMI) 35.0-35.9, adult: Secondary | ICD-10-CM | POA: Diagnosis not present

## 2018-04-13 DIAGNOSIS — R7303 Prediabetes: Secondary | ICD-10-CM | POA: Diagnosis not present

## 2018-04-13 NOTE — Patient Instructions (Signed)
Vrtigo  (Vertigo)  El trmino vrtigo hace referencia a la sensacin de que se est moviendo cuando no es as. El vrtigo puede causarle la sensacin de que las cosas que lo rodean se estn moviendo cuando en realidad eso no sucede. Esta sensacin puede aparecer y desaparecer en cualquier momento. El vrtigo suele desaparecer solo.  CUIDADOS EN EL HOGAR   No haga movimientos rpidos.   No conduzca.   No use maquinaria pesada.   No haga nada que pueda ser peligroso para usted o para otras personas en el caso de que se produjera una crisis de vrtigo.   Sintese de inmediato si est mareado o tiene dificultad para mantener el equilibrio.   Tome los medicamentos de venta libre y los recetados solamente como se lo haya indicado el mdico.   Siga las indicaciones de mdico en lo que respecta a las posiciones o los movimientos que debe evitar.   Beba suficiente lquido para mantener el pis (orina) claro o de color amarillo plido.   Concurra a todas las visitas de control como se lo haya indicado el mdico. Esto es importante.  SOLICITE AYUDA SI:   Los medicamentos no le alivian el vrtigo.   Tiene fiebre.   Los problemas empeoran o le aparecen sntomas nuevos.   Sus familiares o amigos observan cambios en su comportamiento.   Tiene malestar estomacal (nuseas) o vomita.   Tiene sensacin de hormigueo o se le adormece una parte del cuerpo.  SOLICITE AYUDA DE INMEDIATO SI:   Tiene dificultad para moverse o para caminar.   Esta mareado todo el tiempo.   Pierde el conocimiento (se desmaya).   Tiene dolores de cabeza muy intensos.   Se siente dbil o tiene problemas para usar las manos, los brazos o las piernas.   Tiene cambios en la audicin.   Tiene cambios en la visin.   Tiene rigidez en el cuello.   La luz brillante empieza a molestarlo.  Esta informacin no tiene como fin reemplazar el consejo del mdico. Asegrese de hacerle al mdico cualquier pregunta que tenga.  Document Released: 11/22/2010  Document Revised: 07/11/2015 Document Reviewed: 02/12/2015  Elsevier Interactive Patient Education  2018 [employer]

## 2018-04-13 NOTE — Progress Notes (Signed)
Subjective:  Patient ID: Kendra Rodriguez, female    DOB: [birth year]  Age: 52 y.o. MRN: [id number]  CC: annual physical  HPI  Kendra Callanderva B Ramirezis a 52 y.o.femalewith amedical historyof anxiety, depression, prediabetes, OAB, GERD, migraines, varicose veins, lumbar stenosis s/p surgical decompression 10/24/14, s/p cholecystectomy, and s/p hypsterectomy, presents for an annual physical. Says she is trying to watch her carb intake due to her prediabetes. Taking Metformin 850 mg once a day due to abdominal upset. Says she still has occasional right otalgia. Otolaryngologist performed a fiber optic exam and did not find anything abnormal in the ear or throat. Reports that right ear discomfort is worse when she does not take cetirizine. There is occasional dizziness associated with the right ear discomfort/pain. Has chronic mild LBP but there are no other symptoms or complaints at this time.       Outpatient Medications Prior to Visit  Medication Sig Dispense Refill  . cetirizine (ZYRTEC) 10 MG tablet Take 1 tablet (10 mg total) by mouth daily. 30 tablet 11  . dexlansoprazole (DEXILANT) 60 MG capsule Take 60 mg by mouth every morning.     . metFORMIN (GLUCOPHAGE) 850 MG tablet Take 850 mg by mouth 2 (two) times daily with a meal.    . fluticasone (FLONASE) 50 MCG/ACT nasal spray Place 2 sprays daily into both nostrils. (Patient not taking: Reported on 04/13/2018) 16 g 2  . sucralfate (CARAFATE) 1 GM/10ML suspension Take 1 g by mouth 4 (four) times daily -  with meals and at bedtime.    . gabapentin (NEURONTIN) 300 MG capsule Take 1 capsule (300 mg total) at bedtime by mouth. 30 capsule 0  . levocetirizine (XYZAL) 5 MG tablet Take 1 tablet (5 mg total) by mouth every evening. 30 tablet 0  . montelukast (SINGULAIR) 10 MG tablet Take 1 tablet (10 mg total) by mouth at bedtime. 30 tablet 3  . naproxen (NAPROSYN) 500 MG tablet Take 1 tablet (500 mg total) 2 (two) times daily with a meal by mouth. 30  tablet 0  . predniSONE (DELTASONE) 20 MG tablet Take 2 tablets (40 mg total) by mouth daily with breakfast. 10 tablet 0  . rosuvastatin (CRESTOR) 10 MG tablet Take 10 mg by mouth daily.     No facility-administered medications prior to visit.      ROS Review of Systems  Constitutional: Negative for chills, fever and malaise/fatigue.  HENT: Positive for ear pain.   Eyes: Negative for blurred vision.  Respiratory: Negative for shortness of breath.   Cardiovascular: Negative for chest pain and palpitations.  Gastrointestinal: Negative for abdominal pain and nausea.  Genitourinary: Negative for dysuria and hematuria.  Musculoskeletal: Positive for back pain (chronic). Negative for joint pain and myalgias.  Skin: Negative for rash.  Neurological: Positive for dizziness. Negative for tingling and headaches.  Psychiatric/Behavioral: Negative for depression. The patient is not nervous/anxious.     Objective:  BP 114/76 (BP Location: Left Arm, Patient Position: Sitting, Cuff Size: Normal)   Pulse 65   Temp 97.8 F (36.6 C) (Oral)   Ht 5\' 2"  (1.575 m)   Wt 195 lb 9.6 oz (88.7 kg)   SpO2 98%   BMI 35.78 kg/m   BP/Weight 04/13/2018 09/14/2017 08/14/2017  Systolic BP 114 - 130  Diastolic BP 76 - 77  Wt. (Lbs) 195.6 193 191  BMI 35.78 35.3 34.93  Some encounter information is confidential and restricted. Go to Review Flowsheets activity to see all data.  Physical Exam  Constitutional: She is oriented to person, place, and time.  Well developed, well nourished, NAD, polite  HENT:  Head: Normocephalic and atraumatic.  Scar tissue on right TM.  Eyes: Pupils are equal, round, and reactive to light. EOM are normal. No scleral icterus.  Neck: Normal range of motion. Neck supple. No thyromegaly present.  Cardiovascular: Normal rate, regular rhythm, normal heart sounds and intact distal pulses. Exam reveals no friction rub.  No murmur heard. Pulmonary/Chest: Effort normal and  breath sounds normal.  Abdominal: Soft. Bowel sounds are normal. There is no tenderness.  Musculoskeletal: She exhibits no edema.  Neurological: She is alert and oriented to person, place, and time.  Patellar DTR 2+. Normal gait. Normal heel and toe walking.  Skin: Skin is warm and dry. No rash noted. No erythema. No pallor.  Psychiatric: She has a normal mood and affect. Her behavior is normal. Thought content normal.  Vitals reviewed.    Assessment & Plan:    1. Right ear pain - Ambulatory referral to Neurology - Otolaryngology reports normal anatomy by way of fiber optic procedure. - Pt also to be evaluated at dental for TMJ  2. Vertigo - Ambulatory referral to Neurology  3. Prediabetes - CBC with Differential - Comprehensive metabolic panel - Lipid panel  4. Class 2 obesity without serious comorbidity with body mass index (BMI) of 35.0 to 35.9 in adult, unspecified obesity type - CBC with Differential - Comprehensive metabolic panel - Lipid panel     Follow-up: Return in about 8 weeks (around 06/08/2018) for Vertigo, f/u neurology.   Loletta Specter PA

## 2018-04-14 ENCOUNTER — Other Ambulatory Visit (INDEPENDENT_AMBULATORY_CARE_PROVIDER_SITE_OTHER): Payer: Self-pay | Admitting: Physician Assistant

## 2018-04-14 DIAGNOSIS — E7841 Elevated Lipoprotein(a): Secondary | ICD-10-CM

## 2018-04-14 LAB — CBC WITH DIFFERENTIAL/PLATELET
Basophils Absolute: 0 10*3/uL (ref 0.0–0.2)
Basos: 1 %
EOS (ABSOLUTE): 0.1 10*3/uL (ref 0.0–0.4)
EOS: 1 %
HEMATOCRIT: 38 % (ref 34.0–46.6)
Hemoglobin: 13.1 g/dL (ref 11.1–15.9)
Immature Grans (Abs): 0 10*3/uL (ref 0.0–0.1)
Immature Granulocytes: 0 %
LYMPHS ABS: 1.9 10*3/uL (ref 0.7–3.1)
Lymphs: 42 %
MCH: 27.4 pg (ref 26.6–33.0)
MCHC: 34.5 g/dL (ref 31.5–35.7)
MCV: 80 fL (ref 79–97)
MONOS ABS: 0.4 10*3/uL (ref 0.1–0.9)
Monocytes: 8 %
NEUTROS ABS: 2.1 10*3/uL (ref 1.4–7.0)
Neutrophils: 48 %
Platelets: 302 10*3/uL (ref 150–450)
RBC: 4.78 x10E6/uL (ref 3.77–5.28)
RDW: 13 % (ref 12.3–15.4)
WBC: 4.4 10*3/uL (ref 3.4–10.8)

## 2018-04-14 LAB — LIPID PANEL
CHOL/HDL RATIO: 4.3 ratio (ref 0.0–4.4)
Cholesterol, Total: 200 mg/dL — ABNORMAL HIGH (ref 100–199)
HDL: 47 mg/dL (ref >39)
LDL Calculated: 110 mg/dL — ABNORMAL HIGH (ref 0–99)
TRIGLYCERIDES: 214 mg/dL — AB (ref 0–149)
VLDL Cholesterol Cal: 43 mg/dL — ABNORMAL HIGH (ref 5–40)

## 2018-04-14 LAB — COMPREHENSIVE METABOLIC PANEL
A/G RATIO: 1.5 (ref 1.2–2.2)
ALT: 12 IU/L (ref 0–32)
AST: 13 IU/L (ref 0–40)
Albumin: 4.4 g/dL (ref 3.5–5.5)
Alkaline Phosphatase: 113 IU/L (ref 39–117)
BUN / CREAT RATIO: 35 — AB (ref 9–23)
BUN: 19 mg/dL (ref 6–24)
Bilirubin Total: 0.4 mg/dL (ref 0.0–1.2)
CO2: 25 mmol/L (ref 20–29)
Calcium: 9.6 mg/dL (ref 8.7–10.2)
Chloride: 100 mmol/L (ref 96–106)
Creatinine, Ser: 0.55 mg/dL — ABNORMAL LOW (ref 0.57–1.00)
GFR calc Af Amer: 125 mL/min/{1.73_m2} (ref >59)
GFR, EST NON AFRICAN AMERICAN: 108 mL/min/{1.73_m2} (ref >59)
GLOBULIN, TOTAL: 3 g/dL (ref 1.5–4.5)
Glucose: 105 mg/dL — ABNORMAL HIGH (ref 65–99)
POTASSIUM: 4.4 mmol/L (ref 3.5–5.2)
SODIUM: 140 mmol/L (ref 134–144)
Total Protein: 7.4 g/dL (ref 6.0–8.5)

## 2018-04-14 MED ORDER — ATORVASTATIN CALCIUM 40 MG PO TABS: 40.0000 mg | ORAL_TABLET | Freq: Every day | ORAL | 3 refills | Status: DC

## 2018-04-16 ENCOUNTER — Telehealth (INDEPENDENT_AMBULATORY_CARE_PROVIDER_SITE_OTHER): Payer: Self-pay

## 2018-04-16 NOTE — Telephone Encounter (Signed)
[employer] interpreter Kendra CastleValentina [phone] left message informing patient that triglycerides and LDL are elevated, [facility] [address] All other labs normal. Call [facility] with any questions. Kendra Rodriguez, CMA

## 2018-04-16 NOTE — Telephone Encounter (Signed)
-----   Message from Loletta Specteroger David Gomez, PA-C sent at 04/14/2018  8:27 AM EDT ----- Triglycerides and LDL are elevated. I have sent ator[facility]atin to [facility] [address] Rest of labs unremarkable.

## 2018-05-31 ENCOUNTER — Ambulatory Visit (INDEPENDENT_AMBULATORY_CARE_PROVIDER_SITE_OTHER): Payer: BLUE CROSS/BLUE SHIELD | Admitting: Physician Assistant

## 2018-05-31 ENCOUNTER — Encounter (INDEPENDENT_AMBULATORY_CARE_PROVIDER_SITE_OTHER): Payer: Self-pay | Admitting: Physician Assistant

## 2018-05-31 ENCOUNTER — Other Ambulatory Visit: Payer: Self-pay

## 2018-05-31 VITALS — BP 125/63 | HR 75 | Temp 97.8°F | Ht 62.0 in | Wt 197.0 lb

## 2018-05-31 DIAGNOSIS — R110 Nausea: Secondary | ICD-10-CM | POA: Diagnosis not present

## 2018-05-31 DIAGNOSIS — R51 Headache: Secondary | ICD-10-CM | POA: Diagnosis not present

## 2018-05-31 DIAGNOSIS — H9201 Otalgia, right ear: Secondary | ICD-10-CM

## 2018-05-31 DIAGNOSIS — R7303 Prediabetes: Secondary | ICD-10-CM | POA: Diagnosis not present

## 2018-05-31 DIAGNOSIS — F40248 Other situational type phobia: Secondary | ICD-10-CM

## 2018-05-31 DIAGNOSIS — H532 Diplopia: Secondary | ICD-10-CM

## 2018-05-31 DIAGNOSIS — R519 Headache, unspecified: Secondary | ICD-10-CM

## 2018-05-31 DIAGNOSIS — E7841 Elevated Lipoprotein(a): Secondary | ICD-10-CM

## 2018-05-31 DIAGNOSIS — H81399 Other peripheral vertigo, unspecified ear: Secondary | ICD-10-CM

## 2018-05-31 LAB — POCT GLYCOSYLATED HEMOGLOBIN (HGB A1C): HEMOGLOBIN A1C: 5.6 % (ref 4.0–5.6)

## 2018-05-31 MED ORDER — ALPRAZOLAM 2 MG PO TABS: 2.0000 mg | ORAL_TABLET | Freq: Every evening | ORAL | 0 refills | Status: DC | PRN

## 2018-05-31 MED ORDER — ATORVASTATIN CALCIUM 40 MG PO TABS: 40.0000 mg | ORAL_TABLET | Freq: Every day | ORAL | 3 refills | Status: DC

## 2018-05-31 NOTE — Patient Instructions (Signed)
Resonancia magntica  (Magnetic Resonance Imaging)  La resonancia magntica (RM) es un estudio indoloro, que toma imgenes del interior del cuerpo. Este estudio utiliza un potente imn. No utiliza rayos X ni radiacin.  ANTES DEL PROCEDIMIENTO   Le pedirn que se quite todos los objetos de metal que tenga. Esto incluye:  ? El reloj, joyas y otros objetos de metal.  ? Ciertos maquillajes pueden tener trazas de metal y tal vez sea necesario quitarlo.  ? Los aparatos de ortodoncia y las emplomaduras no son un problema.  PROCEDIMIENTO   Es posible que le den tapones para los odos o auriculares para escuchar msica. La mquina puede hacer ruido.   Podra recibir una inyeccin con una sustancia de contraste para ayudar a la resonancia magntica a tomar mejores imgenes.   La resonancia magntica se realiza en un escner con forma de tnel. Usted estar recostado sobre una mesa que se desliza dentro de ese escner. Una vez adentro, podr hablar con la persona que realiza el estudio.   Le pedirn que se quede muy quieto. Le dirn cuando pueda cambiar de posicin. Deber esperar unos minutos para asegurarse de que las imgenes pueden leerse.  DESPUS DEL PROCEDIMIENTO   Podr volver a sus actividades normales de inmediato.   Si le administraron una inyeccin con una sustancia de contraste, la eliminar naturalmente despus de un da.   Su mdico hablar con usted sobre los resultados.  Esta informacin no tiene como fin reemplazar el consejo del mdico. Asegrese de hacerle al mdico cualquier pregunta que tenga.  Document Released: 02/04/2011 Document Revised: 11/10/2014 Document Reviewed: 12/15/2013  Elsevier Interactive Patient Education  2018 [employer] Inc[employer]

## 2018-05-31 NOTE — Progress Notes (Signed)
Subjective:  Patient ID: Kendra Rodriguez, female    DOB: [birth year]  Age: 52 y.o. MRN: [id number]  CC: ear pain and headache  HPI   Kendra Rodriguez a 52 y.o.femalewith amedical historyof anxiety, depression, prediabetes, OAB, GERD, migraines, varicose veins, lumbar stenosis s/p surgical decompression 10/24/14, s/p cholecystectomy, and s/p hypsterectomy, presents to f/u on ear pain. Seen here six weeks ago for right ear pain. Otolaryngology reports normal anatomy by way of fiber optic procedure. Pt advised to be evaluated at dental for TMJ but she has not gone for evaluation. Has popping and pain when chewing on foods or when yawning. Also referred to neurology for occasional vertigo but is still awaiting neurology appointment on 07/08/18. Has taken certirizine and tylenol without relief. Has been experiencing double vision, visual blurring, mild vertigo, pain on either left or right of head (alternates), nausea, and photophobia for more than four weeks now. Feels headaches are worsening and becoming more frequent. Does not endorse CP, palpitations, SOB, abdominal pain, fever, chills, rash, swelling, or GI/GU sxs.     Outpatient Medications Prior to Visit  Medication Sig Dispense Refill  . cetirizine (ZYRTEC) 10 MG tablet Take 1 tablet (10 mg total) by mouth daily. 30 tablet 11  . dexlansoprazole (DEXILANT) 60 MG capsule Take 60 mg by mouth every morning.     . metFORMIN (GLUCOPHAGE) 850 MG tablet Take 850 mg by mouth 2 (two) times daily with a meal.    . atorvastatin (LIPITOR) 40 MG tablet Take 1 tablet (40 mg total) by mouth daily. (Patient not taking: Reported on 05/31/2018) 90 tablet 3  . sucralfate (CARAFATE) 1 GM/10ML suspension Take 1 g by mouth 4 (four) times daily -  with meals and at bedtime.    . fluticasone (FLONASE) 50 MCG/ACT nasal spray Place 2 sprays daily into both nostrils. (Patient not taking: Reported on 04/13/2018) 16 g 2   No facility-administered medications prior to  visit.      ROS Review of Systems  Constitutional: Negative for chills, fever and malaise/fatigue.  HENT: Positive for ear pain.   Eyes: Positive for blurred vision and double vision.  Respiratory: Negative for shortness of breath.   Cardiovascular: Negative for chest pain and palpitations.  Gastrointestinal: Positive for nausea. Negative for abdominal pain and vomiting.  Genitourinary: Negative for dysuria and hematuria.  Musculoskeletal: Negative for joint pain and myalgias.  Skin: Negative for rash.  Neurological: Positive for dizziness and headaches. Negative for tingling.  Psychiatric/Behavioral: Negative for depression. The patient is not nervous/anxious.     Objective:  BP 125/63 (BP Location: Left Arm, Patient Position: Sitting, Cuff Size: Large)   Pulse 75   Temp 97.8 F (36.6 C) (Oral)   Ht 5\' 2"  (1.575 m)   Wt 197 lb (89.4 kg)   SpO2 97%   BMI 36.03 kg/m   BP/Weight 05/31/2018 04/13/2018 09/14/2017  Systolic BP 125 114 -  Diastolic BP 63 76 -  Wt. (Lbs) 197 195.6 193  BMI 36.03 35.78 35.3  Some encounter information is confidential and restricted. Go to Review Flowsheets activity to see all data.      Physical Exam  Constitutional: She is oriented to person, place, and time.  Well developed, well nourished, NAD, polite  HENT:  Head: Normocephalic and atraumatic.  Eyes: Pupils are equal, round, and reactive to light. EOM are normal. No scleral icterus. Right pupil is round and reactive. Left pupil is round and reactive. Pupils are equal.  Neck: Normal range  of motion. Neck supple. No thyromegaly present.  Cardiovascular: Normal rate, regular rhythm and normal heart sounds. Exam reveals no friction rub.  No murmur heard. Pulmonary/Chest: Effort normal and breath sounds normal.  Abdominal: Soft. Bowel sounds are normal. There is no tenderness.  Musculoskeletal: She exhibits no edema.  Neurological: She is alert and oriented to person, place, and time. She is  not disoriented. She displays normal reflexes. No cranial nerve deficit or sensory deficit. She displays a negative Romberg sign. Coordination and gait normal.  Stereognosis intact. Finger to nose intact bilaterally. Strength 5/5 throughout.   Skin: Skin is warm and dry. No rash noted. No erythema. No pallor.  Psychiatric: She has a normal mood and affect. Her behavior is normal. Thought content normal.  Vitals reviewed.    Assessment & Plan:     1. Nonintractable headache, unspecified chronicity pattern, unspecified headache type - MR Brain Wo Contrast; Future - CBC with Differential - Comprehensive metabolic panel - Sedimentation Rate - C-reactive protein  2. Diplopia - MR Brain Wo Contrast; Future  3. Nausea without vomiting - MR Brain Wo Contrast; Future  4. Otalgia of right ear - MR Brain Wo Contrast; Future  5. Other peripheral vertigo, unspecified ear - MR Brain Wo Contrast; Future  6. Other situational type phobia - Begin alprazolam (XANAX) 2 MG tablet; Take 1 tablet (2 mg total) by mouth at bedtime as needed for sleep.  Dispense: 1 tablet; Refill: 0  7. Prediabetes - HgB A1c 5.6% today  8. Elevated lipoprotein(a) - atorvastatin (LIPITOR) 40 MG tablet; Take 1 tablet (40 mg total) by mouth daily.  Dispense: 90 tablet; Refill: 3   Meds ordered this encounter  Medications  . alprazolam (XANAX) 2 MG tablet    Sig: Take 1 tablet (2 mg total) by mouth at bedtime as needed for sleep.    Dispense:  1 tablet    Refill:  0    Order Specific Question:   Supervising Provider    Answer:   Hoy RegisterNEWLIN, ENOBONG [[id number]]  . atorvastatin (LIPITOR) 40 MG tablet    Sig: Take 1 tablet (40 mg total) by mouth daily.    Dispense:  90 tablet    Refill:  3    Order Specific Question:   Supervising Provider    Answer:   Hoy RegisterNEWLIN, ENOBONG [[id number]]    Follow-up: 2 weeks for Headache, vertigo, and diplopia   Loletta Specteroger David Arwyn Besaw PA

## 2018-06-01 LAB — COMPREHENSIVE METABOLIC PANEL
ALBUMIN: 4.3 g/dL (ref 3.5–5.5)
ALK PHOS: 132 IU/L — AB (ref 39–117)
ALT: 22 IU/L (ref 0–32)
AST: 15 IU/L (ref 0–40)
Albumin/Globulin Ratio: 1.3 (ref 1.2–2.2)
BILIRUBIN TOTAL: 0.3 mg/dL (ref 0.0–1.2)
BUN/Creatinine Ratio: 28 — ABNORMAL HIGH (ref 9–23)
BUN: 18 mg/dL (ref 6–24)
CO2: 24 mmol/L (ref 20–29)
CREATININE: 0.64 mg/dL (ref 0.57–1.00)
Calcium: 9.4 mg/dL (ref 8.7–10.2)
Chloride: 101 mmol/L (ref 96–106)
GFR calc non Af Amer: 103 mL/min/{1.73_m2} (ref >59)
GFR, EST AFRICAN AMERICAN: 119 mL/min/{1.73_m2} (ref >59)
GLOBULIN, TOTAL: 3.2 g/dL (ref 1.5–4.5)
GLUCOSE: 126 mg/dL — AB (ref 65–99)
Potassium: 4.1 mmol/L (ref 3.5–5.2)
SODIUM: 138 mmol/L (ref 134–144)
TOTAL PROTEIN: 7.5 g/dL (ref 6.0–8.5)

## 2018-06-01 LAB — CBC WITH DIFFERENTIAL/PLATELET
BASOS ABS: 0 10*3/uL (ref 0.0–0.2)
Basos: 1 %
EOS (ABSOLUTE): 0 10*3/uL (ref 0.0–0.4)
EOS: 1 %
HEMATOCRIT: 37.9 % (ref 34.0–46.6)
Hemoglobin: 12.9 g/dL (ref 11.1–15.9)
IMMATURE GRANS (ABS): 0 10*3/uL (ref 0.0–0.1)
Immature Granulocytes: 0 %
LYMPHS ABS: 2.4 10*3/uL (ref 0.7–3.1)
LYMPHS: 38 %
MCH: 27.5 pg (ref 26.6–33.0)
MCHC: 34 g/dL (ref 31.5–35.7)
MCV: 81 fL (ref 79–97)
MONOCYTES: 7 %
Monocytes Absolute: 0.4 10*3/uL (ref 0.1–0.9)
NEUTROS ABS: 3.5 10*3/uL (ref 1.4–7.0)
Neutrophils: 53 %
Platelets: 302 10*3/uL (ref 150–450)
RBC: 4.69 x10E6/uL (ref 3.77–5.28)
RDW: 13.9 % (ref 12.3–15.4)
WBC: 6.4 10*3/uL (ref 3.4–10.8)

## 2018-06-01 LAB — C-REACTIVE PROTEIN: CRP: 2 mg/L (ref 0–10)

## 2018-06-01 LAB — SEDIMENTATION RATE: Sed Rate: 13 mm/hr (ref 0–40)

## 2018-06-03 ENCOUNTER — Telehealth (INDEPENDENT_AMBULATORY_CARE_PROVIDER_SITE_OTHER): Payer: Self-pay

## 2018-06-03 NOTE — Telephone Encounter (Signed)
-----   Message from Loletta Specteroger David Gomez, PA-C sent at 06/01/2018  1:03 PM EDT ----- Essentially normal results. Normal inflammatory markers.

## 2018-06-03 NOTE — Telephone Encounter (Signed)
Call placed using [employer] interpreter [phone]([phone]) left voicemail notifying patient that labs are normal and inflammatory markers are normal. Call [facility] with any questions or concerns. Maryjean Mornempestt S Sheronda Parran, CMA

## 2018-06-07 ENCOUNTER — Ambulatory Visit (HOSPITAL_COMMUNITY)
Admission: RE | Admit: 2018-06-07 | Discharge: 2018-06-07 | Disposition: A | Discharge status: Home / Self Care | Payer: BLUE CROSS/BLUE SHIELD | Source: Ambulatory Visit | Attending: Physician Assistant | Admitting: Physician Assistant

## 2018-06-07 DIAGNOSIS — H9201 Otalgia, right ear: Secondary | ICD-10-CM | POA: Diagnosis not present

## 2018-06-07 DIAGNOSIS — R51 Headache: Secondary | ICD-10-CM | POA: Diagnosis not present

## 2018-06-07 DIAGNOSIS — R110 Nausea: Secondary | ICD-10-CM | POA: Insufficient documentation

## 2018-06-07 DIAGNOSIS — H532 Diplopia: Secondary | ICD-10-CM | POA: Diagnosis not present

## 2018-06-07 DIAGNOSIS — H81399 Other peripheral vertigo, unspecified ear: Secondary | ICD-10-CM

## 2018-06-07 DIAGNOSIS — R519 Headache, unspecified: Secondary | ICD-10-CM

## 2018-06-07 DIAGNOSIS — R42 Dizziness and giddiness: Secondary | ICD-10-CM | POA: Diagnosis not present

## 2018-06-11 ENCOUNTER — Telehealth (INDEPENDENT_AMBULATORY_CARE_PROVIDER_SITE_OTHER): Payer: Self-pay

## 2018-06-11 NOTE — Telephone Encounter (Signed)
Patients daughter is aware that brain MRI was normal. She will inform patient.  Maryjean Mornempestt S Roberts, CMA

## 2018-06-11 NOTE — Telephone Encounter (Signed)
-----   Message from Loletta Specteroger David Gomez, PA-C sent at 06/08/2018  8:38 AM EDT ----- Normal brain MRI.

## 2018-06-24 ENCOUNTER — Ambulatory Visit (INDEPENDENT_AMBULATORY_CARE_PROVIDER_SITE_OTHER): Payer: BLUE CROSS/BLUE SHIELD | Admitting: Physician Assistant

## 2018-06-24 ENCOUNTER — Encounter (INDEPENDENT_AMBULATORY_CARE_PROVIDER_SITE_OTHER): Payer: Self-pay | Admitting: Physician Assistant

## 2018-06-24 VITALS — BP 97/63 | HR 78 | Temp 98.0°F | Ht 62.0 in | Wt 196.2 lb

## 2018-06-24 DIAGNOSIS — R202 Paresthesia of skin: Secondary | ICD-10-CM | POA: Diagnosis not present

## 2018-06-24 DIAGNOSIS — R519 Headache, unspecified: Secondary | ICD-10-CM

## 2018-06-24 DIAGNOSIS — M79671 Pain in right foot: Secondary | ICD-10-CM

## 2018-06-24 DIAGNOSIS — F458 Other somatoform disorders: Secondary | ICD-10-CM

## 2018-06-24 DIAGNOSIS — R51 Headache: Secondary | ICD-10-CM | POA: Diagnosis not present

## 2018-06-24 MED ORDER — TOPIRAMATE 25 MG PO TABS: 25.0000 mg | ORAL_TABLET | Freq: Two times a day (BID) | ORAL | 2 refills | Status: DC

## 2018-06-24 MED ORDER — PREDNISONE 10 MG PO TABS: 10.0000 mg | ORAL_TABLET | Freq: Every day | ORAL | 0 refills | Status: AC

## 2018-06-24 NOTE — Progress Notes (Signed)
Subjective:  Patient ID: Kendra Rodriguez, female    DOB: [birth year]  Age: 52 y.o. MRN: [id number]  CC: f/u headache/vertigo/diplopia. Grinds teeth.   HPI   Kendra Graffam Ramirezis a 52 y.o.femalewith amedical historyof anxiety, depression,prediabetes,OAB, GERD, migraines, varicose veins, lumbar stenosis s/p surgical decompression 10/24/14, s/p cholecystectomy, and s/p hypsterectomy, presents on f/u of headache/vertigo/diplopia. MR brain w/o contrast was ordered three weeks ago which revealed normal appearance of the brain for her age. Says she still has headache and vertigo at times. Diplopia has not occurred since her last visit here. Headache is often associated with photophobia. Denies any other associated symptoms.      She went to the dentist and was told she has signs of bruxism which may likely be causing her ear pain. Wearing mouthguard at night.      Also complains of right ankle pain and swelling waxing and waning since three weeks ago. Pain and swelling have progressed and mainly located around the right lateral malleolus. Can feel a burning sensation distally to the lateral aspect of right foot. Denies trauma, inversion injury, or fall. No associated back pain. Has not taken anything for relief. Does not endorse any other symptoms.      Outpatient Medications Prior to Visit  Medication Sig Dispense Refill  . atorvastatin (LIPITOR) 40 MG tablet Take 1 tablet (40 mg total) by mouth daily. 90 tablet 3  . cetirizine (ZYRTEC) 10 MG tablet Take 1 tablet (10 mg total) by mouth daily. 30 tablet 11  . dexlansoprazole (DEXILANT) 60 MG capsule Take 60 mg by mouth every morning.     . metFORMIN (GLUCOPHAGE) 850 MG tablet Take 850 mg by mouth 2 (two) times daily with a meal.    . sucralfate (CARAFATE) 1 GM/10ML suspension Take 1 g by mouth 4 (four) times daily -  with meals and at bedtime.    Marland Kitchen alprazolam (XANAX) 2 MG tablet Take 1 tablet (2 mg total) by mouth at bedtime as needed for sleep. 1  tablet 0   No facility-administered medications prior to visit.      ROS Review of Systems  Constitutional: Negative for chills, fever and malaise/fatigue.  Eyes: Negative for blurred vision.  Respiratory: Negative for shortness of breath.   Cardiovascular: Negative for chest pain and palpitations.  Gastrointestinal: Negative for abdominal pain and nausea.  Genitourinary: Negative for dysuria and hematuria.  Musculoskeletal: Positive for joint pain (right ankle). Negative for myalgias.  Skin: Negative for rash.  Neurological: Negative for tingling and headaches.  Psychiatric/Behavioral: Negative for depression. The patient is not nervous/anxious.     Objective:  BP 97/63 (BP Location: Left Arm, Patient Position: Sitting, Cuff Size: Large)   Pulse 78   Temp 98 F (36.7 C) (Oral)   Ht 5\' 2"  (1.575 m)   Wt 196 lb 3.2 oz (89 kg)   SpO2 95%   BMI 35.89 kg/m   BP/Weight 06/24/2018 05/31/2018 04/13/2018  Systolic BP 97 125 114  Diastolic BP 63 63 76  Wt. (Lbs) 196.2 197 195.6  BMI 35.89 36.03 35.78  Some encounter information is confidential and restricted. Go to Review Flowsheets activity to see all data.      Physical Exam  Constitutional: She is oriented to person, place, and time.  Well developed, overweight, NAD, polite  HENT:  Head: Normocephalic and atraumatic.  Eyes: Pupils are equal, round, and reactive to light. EOM are normal. No scleral icterus.  Neck: Normal range of motion. Neck supple. No thyromegaly  present.  Cardiovascular: Normal rate, regular rhythm and normal heart sounds.  Pulmonary/Chest: Effort normal and breath sounds normal.  Musculoskeletal: She exhibits no edema.  Mild edema and mild erythema around the right lateral malleolus with tenderness to palpation and radiculopathy to the distal lateral foot upon palpation.   Neurological: She is alert and oriented to person, place, and time.  Normal gait  Skin: Skin is warm and dry. No rash noted. No  erythema. No pallor.  Psychiatric: She has a normal mood and affect. Her behavior is normal. Thought content normal.  Vitals reviewed.    Assessment & Plan:   1. Nonintractable headache, unspecified chronicity pattern, unspecified headache type - Begin topiramate (TOPAMAX) 25 MG tablet; Take 1 tablet (25 mg total) by mouth 2 (two) times daily.  Dispense: 30 tablet; Refill: 2  2. Right foot pain - Inflammation of the area around right lateral malleolus. - Begin predniSONE (DELTASONE) 10 MG tablet; Take 1 tablet (10 mg total) by mouth daily with breakfast for 12 days. Day 1 take 6 tablets  Day 2 take 6 tablets Day 3 take 5 tablets  Day 4 take 5 tablets   Day 5 take 4 tablets  Day 6 take 4 tablets  Day 7 take 3 tablets   Day 8 take 3 tablets  Day 9 take 2 tablets Day 10 take 2 tablets  Day 11 take 1 tablet   Day 12 take 1 tablet  Dispense: 42 tablet; Refill: 0  3. Paresthesia of right foot - Inflammation of the area around right lateral malleolus is likely affecting the peroneal nerve. Will try prednisone first. If no better by f/u appt, then I will send to orthopedics. - Begin predniSONE (DELTASONE) 10 MG tablet; Take 1 tablet (10 mg total) by mouth daily with breakfast for 12 days. Day 1 take 6 tablets  Day 2 take 6 tablets Day 3 take 5 tablets  Day 4 take 5 tablets   Day 5 take 4 tablets  Day 6 take 4 tablets  Day 7 take 3 tablets   Day 8 take 3 tablets  Day 9 take 2 tablets Day 10 take 2 tablets  Day 11 take 1 tablet   Day 12 take 1 tablet  Dispense: 42 tablet; Refill: 0  4. Bruxism - Continue wearing mouth guard and continue your Dentist's advice.    Meds ordered this encounter  Medications  . predniSONE (DELTASONE) 10 MG tablet    Sig: Take 1 tablet (10 mg total) by mouth daily with breakfast for 12 days. Day 1 take 6 tablets  Day 2 take 6 tablets Day 3 take 5 tablets  Day 4 take 5 tablets   Day 5 take 4 tablets  Day 6 take 4 tablets  Day 7 take 3 tablets   Day 8 take 3 tablets   Day 9 take 2 tablets Day 10 take 2 tablets  Day 11 take 1 tablet   Day 12 take 1 tablet    Dispense:  42 tablet    Refill:  0    Order Specific Question:   Supervising Provider    Answer:   Hoy RegisterNEWLIN, ENOBONG [[id number]]  . topiramate (TOPAMAX) 25 MG tablet    Sig: Take 1 tablet (25 mg total) by mouth 2 (two) times daily.    Dispense:  30 tablet    Refill:  2    Order Specific Question:   Supervising Provider    Answer:   Hoy RegisterNEWLIN, ENOBONG [[id number]]  Follow-up: Return in about 4 weeks (around 07/22/2018) for headache, and foot pain. Flu vaccine.Loletta Specter PA

## 2018-06-24 NOTE — Progress Notes (Signed)
Pt complains of daily ear pain ( right )

## 2018-07-08 ENCOUNTER — Ambulatory Visit: Payer: BLUE CROSS/BLUE SHIELD | Admitting: Neurology

## 2018-07-13 DIAGNOSIS — R140 Abdominal distension (gaseous): Secondary | ICD-10-CM | POA: Diagnosis not present

## 2018-07-13 DIAGNOSIS — K219 Gastro-esophageal reflux disease without esophagitis: Secondary | ICD-10-CM | POA: Diagnosis not present

## 2018-07-13 DIAGNOSIS — R1013 Epigastric pain: Secondary | ICD-10-CM | POA: Diagnosis not present

## 2018-07-30 ENCOUNTER — Telehealth (INDEPENDENT_AMBULATORY_CARE_PROVIDER_SITE_OTHER): Payer: Self-pay | Admitting: Specialist

## 2018-07-30 NOTE — Telephone Encounter (Signed)
Patients daughter called, patient would like to be added to cancellation list. # [phone]

## 2018-08-02 NOTE — Telephone Encounter (Signed)
I put her on the cancellation list 

## 2018-08-18 ENCOUNTER — Other Ambulatory Visit (INDEPENDENT_AMBULATORY_CARE_PROVIDER_SITE_OTHER): Payer: Self-pay | Admitting: Physician Assistant

## 2018-08-18 DIAGNOSIS — H9201 Otalgia, right ear: Secondary | ICD-10-CM

## 2018-08-18 DIAGNOSIS — H6991 Unspecified Eustachian tube disorder, right ear: Secondary | ICD-10-CM

## 2018-08-18 NOTE — Telephone Encounter (Signed)
FWD to PCP. Kendra Rodriguez Kendra Rodriguez, CMA  

## 2018-08-24 DIAGNOSIS — Z23 Encounter for immunization: Secondary | ICD-10-CM | POA: Diagnosis not present

## 2018-08-26 ENCOUNTER — Ambulatory Visit (INDEPENDENT_AMBULATORY_CARE_PROVIDER_SITE_OTHER): Payer: BLUE CROSS/BLUE SHIELD | Admitting: Specialist

## 2018-08-26 ENCOUNTER — Ambulatory Visit (INDEPENDENT_AMBULATORY_CARE_PROVIDER_SITE_OTHER): Payer: Self-pay

## 2018-08-26 ENCOUNTER — Other Ambulatory Visit (INDEPENDENT_AMBULATORY_CARE_PROVIDER_SITE_OTHER): Payer: Self-pay | Admitting: Physician Assistant

## 2018-08-26 ENCOUNTER — Encounter (INDEPENDENT_AMBULATORY_CARE_PROVIDER_SITE_OTHER): Payer: Self-pay | Admitting: Specialist

## 2018-08-26 VITALS — BP 129/73 | HR 76 | Ht 62.0 in | Wt 196.0 lb

## 2018-08-26 DIAGNOSIS — M67471 Ganglion, right ankle and foot: Secondary | ICD-10-CM

## 2018-08-26 DIAGNOSIS — M79671 Pain in right foot: Secondary | ICD-10-CM

## 2018-08-26 DIAGNOSIS — G5601 Carpal tunnel syndrome, right upper limb: Secondary | ICD-10-CM | POA: Diagnosis not present

## 2018-08-26 DIAGNOSIS — M7521 Bicipital tendinitis, right shoulder: Secondary | ICD-10-CM

## 2018-08-26 DIAGNOSIS — M25511 Pain in right shoulder: Secondary | ICD-10-CM

## 2018-08-26 DIAGNOSIS — M7671 Peroneal tendinitis, right leg: Secondary | ICD-10-CM

## 2018-08-26 MED ORDER — DICLOFENAC SODIUM 1 % TD GEL: 4.0000 g | Freq: Four times a day (QID) | TRANSDERMAL | 1 refills | Status: DC

## 2018-08-26 NOTE — Telephone Encounter (Signed)
FWD to PCP. Kendra Rodriguez, CMA  

## 2018-08-26 NOTE — Progress Notes (Signed)
Office Visit Note   Patient: Kendra Rodriguez           Date of Birth: 1966-07-21           MRN: [id number] Visit Date: 08/26/2018              Requested by: Loletta Specter, PA-C [address] [place], [place] [address] PCP: Loletta Specter, PA-C   Assessment & Plan: Visit Diagnoses:  1. Right shoulder pain, unspecified chronicity   2. Right foot pain   3. Carpal tunnel syndrome, right upper limb   4. Tendonitis of upper biceps tendon of right shoulder   5. Peroneal tendonitis of right lower leg   6. Ganglion, right ankle and foot     Plan: Avoid overhead lifting and overhead use of the arms. Do not lift greater than 10 lbs. Tylenol ES one every 6-8 hours for pain and inflamation. Go to PT for exercise program, and then a home exercise program. Voltaren gel up to 4 times a day applied to the right outer foot and right anterior shoulder  May use the wrist splints as needed.    Carpal Tunnel Syndrome  Carpal tunnel syndrome is a condition that causes pain in your hand and arm. The carpal tunnel is a narrow area located on the palm side of your wrist. Repeated wrist motion or certain diseases may cause swelling within the tunnel. This swelling pinches the main nerve in the wrist (median nerve). What are the causes? This condition may be caused by:  Repeated wrist motions.  Wrist injuries.  Arthritis.  A cyst or tumor in the carpal tunnel.  Fluid buildup during pregnancy. Sometimes the cause of this condition is not known. What increases the risk? This condition is more likely to develop in:  People who have jobs that cause them to repeatedly move their wrists in the same motion, such as Health visitor.  Women.  People with certain conditions, such as: ? Diabetes. ? Obesity. ? An underactive thyroid (hypothyroidism). ? Kidney failure. What are the signs or symptoms? Symptoms of this condition include:  A tingling feeling in your fingers,  especially in your thumb, index, and middle fingers.  Tingling or numbness in your hand.  An aching feeling in your entire arm, especially when your wrist and elbow are bent for long periods of time.  Wrist pain that goes up your arm to your shoulder.  Pain that goes down into your palm or fingers.  A weak feeling in your hands. You may have trouble grabbing and holding items. Your symptoms may feel worse during the night. How is this diagnosed? This condition is diagnosed with a medical history and physical exam. You may also have tests, including:  An electromyogram (EMG). This test measures electrical signals sent by your nerves into the muscles.  X-rays. How is this treated? Treatment for this condition includes:  Lifestyle changes. It is important to stop doing or modify the activity that caused your condition.  Physical or occupational therapy.  Medicines for pain and inflammation. This may include medicine that is injected into your wrist.  A wrist splint.  Surgery. Follow these instructions at home: If you have a splint:   Wear it as told by your health care provider. Remove it only as told by your health care provider.  Loosen the splint if your fingers become numb and tingle, or if they turn cold and blue.  Keep the splint clean and dry. General  instructions   Take over-the-counter and prescription medicines only as told by your health care provider.  Rest your wrist from any activity that may be causing your pain. If your condition is work related, talk to your employer about changes that can be made, such as getting a wrist pad to use while typing.  If directed, apply ice to the painful area: ? Put ice in a plastic bag. ? Place a towel between your skin and the bag. ? Leave the ice on for 20 minutes, 2-3 times per day.  Keep all follow-up visits as told by your health care provider. This is important.  Do any exercises as told by your health care  provider, physical therapist, or occupational therapist. Contact a health care provider if:  You have new symptoms.  Your pain is not controlled with medicines.  Your symptoms get worse.   Follow-Up Instructions: No follow-ups on file.   Orders:  Orders Placed This Encounter  Procedures  . XR Shoulder Right  . XR Foot Complete Right   No orders of the defined types were placed in this encounter.     Procedures: No procedures performed   Clinical Data: No additional findings.   Subjective: Chief Complaint  Patient presents with  . Right Shoulder - Pain  . Right Foot - Pain    52 year old right handed female with 3 month history of right shoulder pain and right foot pain. The pain in the right shoulder presented gradually and she noticed right shoulder pain with over head use of the right arm and with lying on the right side. She saw her primary MD Dr. Su Hilt, he prescribed prednisone. History of GERD. She has history of right foot pain with standing and walking and at rest The pain is less. Then the right shoulder is painful, sometimes with neck stiffness and discomfort with occasional stiffness and grating in the lower neck. There is some weakness in the right wrist, with gripping and with cleaning or opening jars. The burning pain is sometimes.   Review of Systems  Constitutional: Negative.   HENT: Negative.   Eyes: Negative.   Respiratory: Negative.   Cardiovascular: Negative.   Gastrointestinal: Negative.   Endocrine: Negative.   Genitourinary: Negative.   Musculoskeletal: Positive for arthralgias, myalgias and neck stiffness.  Skin: Negative.   Allergic/Immunologic: Negative.   Neurological: Negative.   Hematological: Negative.   Psychiatric/Behavioral: Negative.      Objective: Vital Signs: BP 129/73 (BP Location: Left Arm, Patient Position: Sitting)   Pulse 76   Ht 5\' 2"  (1.575 m)   Wt 196 lb (88.9 kg)   BMI 35.85 kg/m   Physical Exam  Right  Ankle Exam   Tenderness  The patient is experiencing tenderness in the lateral malleolus. Swelling: moderate  Range of Motion  Dorsiflexion: normal  Plantar flexion: normal  Eversion:  10 abnormal  Inversion: abnormal   Muscle Strength  Dorsiflexion:  5/5 Plantar flexion:  5/5 Anterior tibial:  5/5 Posterior tibial:  5/5 Gastrocsoleus:  5/5 Peroneal muscle:  4/5  Tests  Anterior drawer: negative Varus tilt: negative  Other  Erythema: absent Sensation: normal Pulse: present   Comments:  Pain with eversion of the right ankle and foot vs resistance. Area of swelling right anterolateral ankle and lateral talocalcaneal Region 5cm x 9cm x 2cm, this transluminates.    Left Ankle Exam  Left ankle exam is normal.   Right Hand Exam   Tenderness  The patient is experiencing  tenderness in the palmer area.  Range of Motion  Wrist  Extension: normal  Flexion: abnormal  Pronation: normal  Supination: normal  Hand  MP Thumb: normal  MP Index: normal  MP Middle: normal  MP Ring: normal  MP Little: normal   Muscle Strength  Wrist extension: 5/5  Wrist flexion: 5/5  Grip: 5/5   Tests  Phalen's sign: positive Tinel's sign (median nerve): positive Finkelstein's test: negative  Other  Erythema: absent Scars: absent Sensation: decreased Pulse: present   Right Shoulder Exam   Tenderness  The patient is experiencing tenderness in the biceps tendon.  Range of Motion  Active abduction:  140 abnormal  Passive abduction:  150 abnormal  Extension: 60  External rotation: 90  Forward flexion: 150  Internal rotation 0 degrees: normal  Internal rotation 90 degrees: normal   Muscle Strength  Abduction: 5/5  Internal rotation: 5/5  External rotation: 5/5  Supraspinatus: 5/5  Subscapularis: 5/5  Biceps: 4/5   Tests  Apprehension: negative Hawkins test: negative Cross arm: negative Impingement: positive Drop arm: negative Sulcus: absent  Other  Scars:  absent Sensation: normal Pulse: present  Comments:  Pain with bicep testing vs resistance and direct palpation of the right anterior bicipital groove.    Left Shoulder Exam  Left shoulder exam is normal.      Specialty Comments:  No specialty comments available.  Imaging: No results found.   PMFS History: Patient Active Problem List   Diagnosis Date Noted  . Right ear pain 04/13/2018  . Vertigo 04/13/2018  . Prediabetes 10/17/2015  . Hypertriglyceridemia 10/17/2015  . Lumbar disc herniation 10/25/2014  . Spinal stenosis, lumbar region, with neurogenic claudication 10/24/2014  . Varicose veins of right leg with edema 01/30/2014   Past Medical History:  Diagnosis Date  . Acute sinusitis   . Allergy   . Anxiety   . Depression    NO MEDICATIONS - DOING OK NOW  . Dizziness   . Ear ache    RIGHT --HX OF MULTIPLE EAR INFECTIONS AS A CHILD   . GERD (gastroesophageal reflux disease)   . Headache(784.0)    MIGRAINES  . Leg pain, left    with swelling  . Lumbar stenosis    PAIN BACK AND LEFT LEG AND NUMBNESS LEG AND FOOT  . Peripheral vascular disease (HCC)    HX OF TREATMENT FOR VARICOSE VEINS RIGHT LEG  . Thyroid disease    TOLD BLOOD LEVELS SLIGHTLY ABNORMAL - BUT NOT REQUIRED MEDICATION    Family History  Problem Relation Age of Onset  . Hypertension Mother   . Arthritis Mother   . Diabetes Mother     Past Surgical History:  Procedure Laterality Date  . ABDOMINAL HYSTERECTOMY    . bladder lift     AT SAME TIME OF HYSTERECTOMY  . CHOLECYSTECTOMY    . LUMBAR LAMINECTOMY/DECOMPRESSION MICRODISCECTOMY Left 10/24/2014   Procedure: COMPLETE LUMBAR DECOMPRESSION L4-L5 CENTRAL AND MICRODISCECTOMY L4-L5 LEFT;  Surgeon: Jacki Cones, MD;  Location: [facility];  Service: Orthopedics;  Laterality: Left;   Social History   Occupational History  . Not on file  Tobacco Use  . Smoking status: Never Smoker  . Smokeless tobacco: Never Used  Substance and Sexual  Activity  . Alcohol use: No  . Drug use: No  . Sexual activity: Yes

## 2018-08-26 NOTE — Patient Instructions (Signed)
Avoid overhead lifting and overhead use of the arms. Do not lift greater than 10 lbs. Tylenol ES one every 6-8 hours for pain and inflamation. Go to PT for exercise program, and then a home exercise program. Voltaren gel up to 4 times a day applied to the right outer foot and right anterior shoulder  May use the wrist splints as needed.    Carpal Tunnel Syndrome  Carpal tunnel syndrome is a condition that causes pain in your hand and arm. The carpal tunnel is a narrow area located on the palm side of your wrist. Repeated wrist motion or certain diseases may cause swelling within the tunnel. This swelling pinches the main nerve in the wrist (median nerve). What are the causes? This condition may be caused by:  Repeated wrist motions.  Wrist injuries.  Arthritis.  A cyst or tumor in the carpal tunnel.  Fluid buildup during pregnancy. Sometimes the cause of this condition is not known. What increases the risk? This condition is more likely to develop in:  People who have jobs that cause them to repeatedly move their wrists in the same motion, such as Health visitor.  Women.  People with certain conditions, such as: ? Diabetes. ? Obesity. ? An underactive thyroid (hypothyroidism). ? Kidney failure. What are the signs or symptoms? Symptoms of this condition include:  A tingling feeling in your fingers, especially in your thumb, index, and middle fingers.  Tingling or numbness in your hand.  An aching feeling in your entire arm, especially when your wrist and elbow are bent for long periods of time.  Wrist pain that goes up your arm to your shoulder.  Pain that goes down into your palm or fingers.  A weak feeling in your hands. You may have trouble grabbing and holding items. Your symptoms may feel worse during the night. How is this diagnosed? This condition is diagnosed with a medical history and physical exam. You may also have tests, including:  An  electromyogram (EMG). This test measures electrical signals sent by your nerves into the muscles.  X-rays. How is this treated? Treatment for this condition includes:  Lifestyle changes. It is important to stop doing or modify the activity that caused your condition.  Physical or occupational therapy.  Medicines for pain and inflammation. This may include medicine that is injected into your wrist.  A wrist splint.  Surgery. Follow these instructions at home: If you have a splint:   Wear it as told by your health care provider. Remove it only as told by your health care provider.  Loosen the splint if your fingers become numb and tingle, or if they turn cold and blue.  Keep the splint clean and dry. General instructions   Take over-the-counter and prescription medicines only as told by your health care provider.  Rest your wrist from any activity that may be causing your pain. If your condition is work related, talk to your employer about changes that can be made, such as getting a wrist pad to use while typing.  If directed, apply ice to the painful area: ? Put ice in a plastic bag. ? Place a towel between your skin and the bag. ? Leave the ice on for 20 minutes, 2-3 times per day.  Keep all follow-up visits as told by your health care provider. This is important.  Do any exercises as told by your health care provider, physical therapist, or occupational therapist. Contact a health care provider if:  You  have new symptoms.  Your pain is not controlled with medicines.  Your symptoms get worse.   This information is not intended to replace advice given to you by your health care provider. Make sure you discuss any questions you have with your health care provider. Document Released: 10/17/2000 Document Revised: 02/28/2016 Document Reviewed: 07/01/2017 Elsevier Interactive Patient Education  2017 [employer].

## 2018-08-31 ENCOUNTER — Encounter: Payer: Self-pay | Admitting: Physical Therapy

## 2018-08-31 ENCOUNTER — Ambulatory Visit: Payer: BLUE CROSS/BLUE SHIELD | Attending: Specialist | Admitting: Physical Therapy

## 2018-08-31 ENCOUNTER — Other Ambulatory Visit: Payer: Self-pay

## 2018-08-31 DIAGNOSIS — G8929 Other chronic pain: Secondary | ICD-10-CM

## 2018-08-31 DIAGNOSIS — M6281 Muscle weakness (generalized): Secondary | ICD-10-CM | POA: Diagnosis not present

## 2018-08-31 DIAGNOSIS — M25511 Pain in right shoulder: Secondary | ICD-10-CM | POA: Insufficient documentation

## 2018-08-31 DIAGNOSIS — M25571 Pain in right ankle and joints of right foot: Secondary | ICD-10-CM | POA: Diagnosis not present

## 2018-08-31 DIAGNOSIS — R2689 Other abnormalities of gait and mobility: Secondary | ICD-10-CM | POA: Insufficient documentation

## 2018-08-31 NOTE — Patient Instructions (Addendum)

## 2018-08-31 NOTE — Therapy (Signed)
[facility] [address] [place], [place], [address] Phone: [phone]   Fax:  [phone]  Physical Therapy Treatment  Patient Details  Name: Kendra Rodriguez MRN: [id number] Date of Birth: 1966-02-20 Referring Provider (PT): [name]   Encounter Date: 08/31/2018  PT End of Session - 08/31/18 1520    Visit Number  1    Number of Visits  17    Date for PT Re-Evaluation  10/26/18    PT Start Time  1331    PT Stop Time  1415    PT Time Calculation (min)  44 min    Activity Tolerance  Patient tolerated treatment well    Behavior During Therapy  [facility] for tasks assessed/performed       Past Medical History:  Diagnosis Date  . Acute sinusitis   . Allergy   . Anxiety   . Depression    NO MEDICATIONS - DOING OK NOW  . Dizziness   . Ear ache    RIGHT --HX OF MULTIPLE EAR INFECTIONS AS A CHILD   . GERD (gastroesophageal reflux disease)   . Headache(784.0)    MIGRAINES  . Leg pain, left    with swelling  . Lumbar stenosis    PAIN BACK AND LEFT LEG AND NUMBNESS LEG AND FOOT  . Peripheral vascular disease (HCC)    HX OF TREATMENT FOR VARICOSE VEINS RIGHT LEG  . Thyroid disease    TOLD BLOOD LEVELS SLIGHTLY ABNORMAL - BUT NOT REQUIRED MEDICATION    Past Surgical History:  Procedure Laterality Date  . ABDOMINAL HYSTERECTOMY    . bladder lift     AT SAME TIME OF HYSTERECTOMY  . CHOLECYSTECTOMY    . LUMBAR LAMINECTOMY/DECOMPRESSION MICRODISCECTOMY Left 10/24/2014   Procedure: COMPLETE LUMBAR DECOMPRESSION L4-L5 CENTRAL AND MICRODISCECTOMY L4-L5 LEFT;  Surgeon: Jacki Cones, MD;  Location: [facility];  Service: Orthopedics;  Laterality: Left;    There were no vitals filed for this visit.  Subjective Assessment - 08/31/18 1336    Subjective  pt is 52 y.o F With CC of R foot and R ankle with no specific onset that started about 3 months. Pain starts in the shoulder and refers down the R arm tot he elbow and report of  wrist pain. In  R foot it startes inthe ankle and radiates into the toe and feel like its burning, Since onset the shoulder feels like it is worsening, and the foot is staying the same since onset.     Limitations  Walking;Standing    How long can you sit comfortably?  20- 30 min     How long can you stand comfortably?  20 min     How long can you walk comfortably?  20 min     Diagnostic tests  X-ray 08/26/2018    Patient Stated Goals  decrease pain and not have to take mediacation,     Currently in Pain?  Yes    Pain Score  8     Pain Location  Shoulder    Pain Orientation  Right;Lateral;Anterior    Pain Descriptors / Indicators  Aching   pinching   Pain Type  Chronic pain    Pain Radiating Towards  to the elbow    Pain Onset  More than a month ago    Pain Frequency  Constant    Aggravating Factors   reaching overhead, getting dressed, grooming    Pain Relieving Factors  laying down, ointment  Effect of Pain on Daily Activities  limited lifting/ movement with R shoulder    Multiple Pain Sites  Yes    Pain Score  8    Pain Location  Ankle    Pain Orientation  Right    Pain Descriptors / Indicators  Burning   pinching   Pain Type  Chronic pain    Pain Onset  More than a month ago    Pain Frequency  Constant    Aggravating Factors   standing/ walking     Pain Relieving Factors  N/A    Effect of Pain on Daily Activities  limited walking/ standing endurance         [facility] PT Assessment - 08/31/18 1326      Assessment   Medical Diagnosis  R shoulder pain, R peroneals tendinitis, and R bicep tendinitis    Referring Provider (PT)  [name]    Onset Date/Surgical Date  --   3 months   Hand Dominance  Right    Next MD Visit  6 weeks    Prior Therapy  yes      Precautions   Precautions  None      Restrictions   Weight Bearing Restrictions  No      Balance Screen   Has the patient fallen in the past 6 months  No      Home Environment   Living Environment  Private  residence    Living Arrangements  Spouse/significant other;Children    Available Help at Discharge  Family    Type of Home  House    Home Access  Stairs to enter    Entrance Stairs-Number of Steps  5    Entrance Stairs-Rails  Can reach both    Home Layout  One level      Prior Function   Level of Independence  Independent with basic ADLs    Vocation  Unemployed      Cognition   Overall Cognitive Status  Within Functional Limits for tasks assessed      Posture/Postural Control   Posture/Postural Control  Postural limitations    Postural Limitations  Rounded Shoulders;Forward head      ROM / Strength   AROM / PROM / Strength  AROM;PROM;Strength      AROM   Overall AROM Comments  bil ankle ROM WFL    AROM Assessment Site  Shoulder;Ankle;Knee    Right/Left Shoulder  Right;Left    Right Shoulder Extension  55 Degrees    Right Shoulder Flexion  98 Degrees   ERP    Right Shoulder ABduction  78 Degrees    Right Shoulder Internal Rotation  --   R SIJ   Right Shoulder External Rotation  --   C3   Left Shoulder Extension  65 Degrees    Left Shoulder Flexion  154 Degrees    Left Shoulder ABduction  167 Degrees    Left Shoulder Internal Rotation  --   T9   Left Shoulder External Rotation  --   T2   Right/Left Ankle  Right;Left      Strength   Strength Assessment Site  Shoulder;Elbow;Hand;Ankle    Right/Left Shoulder  Right;Left    Right/Left Elbow  Right;Left    Right Hand Grip (lbs)  27.6   27,32,24   Left Hand Grip (lbs)  33.3   36,38,26   Right/Left Ankle  Right;Left    Right Ankle Dorsiflexion  4/5    Right Ankle Plantar  Flexion  4/5    Right Ankle Inversion  4/5    Right Ankle Eversion  4/5    Left Ankle Dorsiflexion  5/5    Left Ankle Plantar Flexion  5/5    Left Ankle Inversion  5/5    Left Ankle Eversion  5/5      Palpation   Palpation comment  TTP in the R ankle along the peroneal brevis/ longus and pain along the 5th metatarsal, R shoulder upper trap  tighness, TTP along the bicep brachii       Special Tests    Special Tests  Rotator Cuff Impingement;Ankle/Foot Special Tests    Rotator Cuff Impingment tests  Leanord Asal test;other;Painful Arc of Motion    Ankle/Foot Special Tests   Thompson's Test;Talar Tilt Test      Hawkins-Kennedy test   Findings  Positive    Side  Right      Painful Arc of Motion   Findings  Positive    Side  Right      other   Findings  Negative    Comments  scapular assist      Talar Tilt Test    Findings  Negative      Thompson's Test   Findings  Negative      Ambulation/Gait   Ambulation/Gait  Yes    Gait Pattern  Step-through pattern;Decreased stance time - right;Decreased step length - left;Trendelenburg;Antalgic                   [facility] Adult PT Treatment/Exercise - 08/31/18 1326      Exercises   Exercises  Shoulder;Ankle      Shoulder Exercises: Seated   Other Seated Exercises  shoulder table slides flexion / abduction 1 x 10 ea.      Shoulder Exercises: Stretch   Other Shoulder Stretches  upper trap stretch 2 x 30 sec      Modalities   Modalities  Iontophoresis      Iontophoresis   Type of Iontophoresis  Dexamethasone    Location  R lateral distal peroneals along th ebase of fifth    Dose  4mg /ml    Time  6 hour stat patch      Ankle Exercises: Stretches   Gastroc Stretch  2 reps;30 seconds             PT Education - 08/31/18 1414    Education Details  evaluation findings, POC, goals, HEP with proper form/ rationale.     Person(s) Educated  Patient    Methods  Explanation;Verbal cues;Handout;Demonstration    Comprehension  Verbalized understanding;Verbal cues required;Returned demonstration       PT Short Term Goals - 08/31/18 1531      PT SHORT TERM GOAL #1   Title  pt to be I with inital HEP    Time  4    Period  Weeks    Status  On-going    Target Date  09/28/18      PT SHORT TERM GOAL #2   Title  pt to verbalize techniques to calm down  R shoulder and foot pain via RICE and HEP    Time  4    Period  Weeks    Status  New    Target Date  09/28/18      PT SHORT TERM GOAL #3   Title  pt to increase R grip strength by >/= 10# to demo improvement in shoulder function    Time  4    Period  Weeks    Status  New    Target Date  09/28/18      PT SHORT TERM GOAL #4   Title  pt to be able to perofrm grooming and dressing with  </= 5/10 pain in the R shoulder    Time  4    Period  Weeks    Status  New    Target Date  09/28/18        PT Long Term Goals - 08/31/18 1532      PT LONG TERM GOAL #1   Title  increase R shoulder abduction/ flexion to >/= 130 and IR to L1, and ER to C6 to promote functional mobility required for ADLs with </= 2/10 pain     Time  8    Period  Weeks    Status  New    Target Date  10/26/18      PT LONG TERM GOAL #2   Title  increase R shoulder strength to >/= 4/5 to promote functional lifting and carrying for ADLs with </= 2/10 pain     Time  8    Period  Weeks    Status  New    Target Date  09/28/18      PT LONG TERM GOAL #3   Title  increase R ankle strength to >/= 5/5 in all planes to promote stability with walking/ standing activities and maximize gait efficency    Time  8    Period  Weeks    Status  New    Target Date  10/26/18      PT LONG TERM GOAL #4   Title  pt to increase FOTO score </= 35% limited to demo improvement in function     Time  8    Period  Weeks    Status  New    Target Date  10/26/18      PT LONG TERM GOAL #5   Title  pt to be able to stand/ walk >/= 60 min with </= 2/10 pain for functional endurance required for household tasks     Time  8    Period  Weeks    Status  New    Target Date  10/26/18      Additional Long Term Goals   Additional Long Term Goals  --      PT LONG TERM GOAL #6   Title  pt to be i with all HEP given as of last visit to maintain and progress current function independently    Time  8    Period  Weeks    Status  New    Target  Date  10/26/18            Plan - 08/31/18 1415    Clinical Impression Statement  pt is a 52 y.o F presents to OPPT with CC of R shoulder and lateral ankle pain starting 3 months ago with specific onset. She demonstrates significant R shoulder ROM limitation due to guarding and pain, unable to perofrm MMT due to pain, and limited special testing . R ankle she has functional ROM but demosntrates pain with eversion and plantarflexion with tenderness noted along the peroneal longus and fifth metarsal. she we would benefit from physical therapy to decrease R shoulder and ankle pain, improve mobility and strength and maximize funciton by addressing the deficits listed.     History and Personal Factors relevant to plan of care:  pt speaks spanish, hx of low back pain    Clinical Presentation  Evolving    Clinical Presentation due to:  pain in multiple joints, limited shoulder ROM, gen weakness    Clinical Decision Making  Moderate    Rehab Potential  Good    PT Frequency  2x / week    PT Duration  6 weeks    PT Treatment/Interventions  ADLs/Self Care Home Management;Cryotherapy;Electrical Stimulation;Iontophoresis 4mg /ml Dexamethasone;Moist Heat;Ultrasound;Therapeutic activities;Therapeutic exercise;Manual techniques;Taping;Vasopneumatic Device;Dry needling;Passive range of motion;Patient/family education;Gait training;Balance training    PT Next Visit Plan  review/ update HEP, shoulder ROM, STW, scapular mobs, how was ionto patch.     PT Home Exercise Plan  upper trap stretch, scapular retraction, calf stretch, table slides flexion/ abduction    Consulted and Agree with Plan of Care  Patient       Patient will benefit from skilled therapeutic intervention in order to improve the following deficits and impairments:  Pain, Decreased endurance, Decreased strength, Increased fascial restricitons, Difficulty walking, Postural dysfunction, Improper body mechanics, Decreased activity tolerance, Decreased  range of motion, Abnormal gait, Increased edema  Visit Diagnosis: Chronic right shoulder pain  Muscle weakness (generalized)  Pain in right ankle and joints of right foot  Other abnormalities of gait and mobility     Problem List Patient Active Problem List   Diagnosis Date Noted  . Right ear pain 04/13/2018  . Vertigo 04/13/2018  . Prediabetes 10/17/2015  . Hypertriglyceridemia 10/17/2015  . Lumbar disc herniation 10/25/2014  . Spinal stenosis, lumbar region, with neurogenic claudication 10/24/2014  . Varicose veins of right leg with edema 01/30/2014   Lulu Riding PT, DPT, LAT, ATC  08/31/18  3:39 PM      [facility] [address] [place], [place], [address] Phone: [phone]   Fax:  [phone]  Name: Kendra Rodriguez MRN: [id number] Date of Birth: 08-25-1966

## 2018-09-08 ENCOUNTER — Ambulatory Visit: Payer: BLUE CROSS/BLUE SHIELD | Attending: Specialist | Admitting: Physical Therapy

## 2018-09-08 ENCOUNTER — Encounter: Payer: Self-pay | Admitting: Physical Therapy

## 2018-09-08 DIAGNOSIS — R2689 Other abnormalities of gait and mobility: Secondary | ICD-10-CM

## 2018-09-08 DIAGNOSIS — M25571 Pain in right ankle and joints of right foot: Secondary | ICD-10-CM

## 2018-09-08 DIAGNOSIS — G8929 Other chronic pain: Secondary | ICD-10-CM | POA: Diagnosis not present

## 2018-09-08 DIAGNOSIS — M25511 Pain in right shoulder: Secondary | ICD-10-CM | POA: Diagnosis not present

## 2018-09-08 DIAGNOSIS — M6281 Muscle weakness (generalized): Secondary | ICD-10-CM | POA: Insufficient documentation

## 2018-09-08 NOTE — Therapy (Signed)
[facility] [address] [place], [place], [address] Phone: [phone]   Fax:  [phone]  Physical Therapy Treatment  Patient Details  Name: Kendra Rodriguez MRN: [id number] Date of Birth: 11/07/1965 Referring Provider (PT): [name]   Encounter Date: 09/08/2018  PT End of Session - 09/08/18 1733    Visit Number  2    Number of Visits  17    Date for PT Re-Evaluation  10/26/18    PT Start Time  1547    PT Stop Time  1628    PT Time Calculation (min)  41 min    Activity Tolerance  Patient tolerated treatment well    Behavior During Therapy  [facility] for tasks assessed/performed       Past Medical History:  Diagnosis Date  . Acute sinusitis   . Allergy   . Anxiety   . Depression    NO MEDICATIONS - DOING OK NOW  . Dizziness   . Ear ache    RIGHT --HX OF MULTIPLE EAR INFECTIONS AS A CHILD   . GERD (gastroesophageal reflux disease)   . Headache(784.0)    MIGRAINES  . Leg pain, left    with swelling  . Lumbar stenosis    PAIN BACK AND LEFT LEG AND NUMBNESS LEG AND FOOT  . Peripheral vascular disease (HCC)    HX OF TREATMENT FOR VARICOSE VEINS RIGHT LEG  . Thyroid disease    TOLD BLOOD LEVELS SLIGHTLY ABNORMAL - BUT NOT REQUIRED MEDICATION    Past Surgical History:  Procedure Laterality Date  . ABDOMINAL HYSTERECTOMY    . bladder lift     AT SAME TIME OF HYSTERECTOMY  . CHOLECYSTECTOMY    . LUMBAR LAMINECTOMY/DECOMPRESSION MICRODISCECTOMY Left 10/24/2014   Procedure: COMPLETE LUMBAR DECOMPRESSION L4-L5 CENTRAL AND MICRODISCECTOMY L4-L5 LEFT;  Surgeon: Jacki Cones, MD;  Location: [facility];  Service: Orthopedics;  Laterality: Left;    There were no vitals filed for this visit.  Subjective Assessment - 09/08/18 1551    Subjective  "the shoulder is doing better and the R foot is still sore"     Currently in Pain?  Yes    Pain Score  6     Pain Orientation  Right;Left    Pain Type  Chronic pain    Pain  Onset  More than a month ago    Aggravating Factors   reaching     Pain Score  7    Pain Location  Ankle    Pain Orientation  Right    Pain Descriptors / Indicators  Burning;Sore                       [facility] Adult PT Treatment/Exercise - 09/08/18 0001      Exercises   Exercises  Shoulder;Ankle      Shoulder Exercises: Supine   Protraction  Right;Strengthening;10 reps;Weights   pt fatigued quickly with exercise   Protraction Weight (lbs)  2      Shoulder Exercises: Seated   Other Seated Exercises  lower trap strengtheing with elbows on bolster 2 x 10 with red theraband      Iontophoresis   Type of Iontophoresis  Dexamethasone    Location  R lateral distal peroneals along the base of fifth    Dose  4mg /ml    Time  6 hour stat patch      Manual Therapy   Manual Therapy  Joint mobilization;Soft tissue mobilization;Other (  comment)    Manual therapy comments  skilled palpation and monitoring pt throghout TPDN    Joint Mobilization  R ankle distraction mobs grade 4 & 5,  R shoulder scapular mobs grade 3 in all directions with emphasis on upward rotation.    scapular assist combined with shoulder flexion/ abduction   Soft tissue mobilization  IASTM along R peroneal longus/ brevis    Other Manual Therapy  MTPR along R rhomboids and pec major       Trigger Point Dry Needling - 09/08/18 1556    Consent Given?  Yes    Education Handout Provided  Yes    Muscles Treated Lower Body  --   peroneal longus/ brevis on the R          PT Education - 09/08/18 1736    Education Details  reviewed HEP. DN eduation benefits and what to expect.     Person(s) Educated  Patient    Methods  Explanation;Verbal cues    Comprehension  Verbalized understanding;Verbal cues required       PT Short Term Goals - 08/31/18 1531      PT SHORT TERM GOAL #1   Title  pt to be I with inital HEP    Time  4    Period  Weeks    Status  On-going    Target Date  09/28/18      PT SHORT  TERM GOAL #2   Title  pt to verbalize techniques to calm down R shoulder and foot pain via RICE and HEP    Time  4    Period  Weeks    Status  New    Target Date  09/28/18      PT SHORT TERM GOAL #3   Title  pt to increase R grip strength by >/= 10# to demo improvement in shoulder function    Time  4    Period  Weeks    Status  New    Target Date  09/28/18      PT SHORT TERM GOAL #4   Title  pt to be able to perofrm grooming and dressing with  </= 5/10 pain in the R shoulder    Time  4    Period  Weeks    Status  New    Target Date  09/28/18        PT Long Term Goals - 08/31/18 1532      PT LONG TERM GOAL #1   Title  increase R shoulder abduction/ flexion to >/= 130 and IR to L1, and ER to C6 to promote functional mobility required for ADLs with </= 2/10 pain     Time  8    Period  Weeks    Status  New    Target Date  10/26/18      PT LONG TERM GOAL #2   Title  increase R shoulder strength to >/= 4/5 to promote functional lifting and carrying for ADLs with </= 2/10 pain     Time  8    Period  Weeks    Status  New    Target Date  09/28/18      PT LONG TERM GOAL #3   Title  increase R ankle strength to >/= 5/5 in all planes to promote stability with walking/ standing activities and maximize gait efficency    Time  8    Period  Weeks    Status  New  Target Date  10/26/18      PT LONG TERM GOAL #4   Title  pt to increase FOTO score </= 35% limited to demo improvement in function     Time  8    Period  Weeks    Status  New    Target Date  10/26/18      PT LONG TERM GOAL #5   Title  pt to be able to stand/ walk >/= 60 min with </= 2/10 pain for functional endurance required for household tasks     Time  8    Period  Weeks    Status  New    Target Date  10/26/18      Additional Long Term Goals   Additional Long Term Goals  --      PT LONG TERM GOAL #6   Title  pt to be i with all HEP given as of last visit to maintain and progress current function  independently    Time  8    Period  Weeks    Status  New    Target Date  10/26/18            Plan - 09/08/18 1733    Clinical Impression Statement  pt reports consistency with HEP and improvement of the R shoulder pain but conitnued pain in the R ankle/ foot. Educated and consent was given for DN along the peroneals followed with IASTM techniques and ankle mobs. For shoulder focused on scapular motion and scapulohumeral rhythm which she noted improvement with shoudler motions. continued Ionto along R ankle for pain relief.     PT Treatment/Interventions  ADLs/Self Care Home Management;Cryotherapy;Electrical Stimulation;Iontophoresis 4mg /ml Dexamethasone;Moist Heat;Ultrasound;Therapeutic activities;Therapeutic exercise;Manual techniques;Taping;Vasopneumatic Device;Dry needling;Passive range of motion;Patient/family education;Gait training;Balance training    PT Next Visit Plan  update HEP, shoulder ROM, STW, scapular mobs, peri-scapular strengthening.     PT Home Exercise Plan  upper trap stretch, scapular retraction, calf stretch, table slides flexion/ abduction    Consulted and Agree with Plan of Care  Patient       Patient will benefit from skilled therapeutic intervention in order to improve the following deficits and impairments:  Pain, Decreased endurance, Decreased strength, Increased fascial restricitons, Difficulty walking, Postural dysfunction, Improper body mechanics, Decreased activity tolerance, Decreased range of motion, Abnormal gait, Increased edema  Visit Diagnosis: Chronic right shoulder pain  Muscle weakness (generalized)  Pain in right ankle and joints of right foot  Other abnormalities of gait and mobility     Problem List Patient Active Problem List   Diagnosis Date Noted  . Right ear pain 04/13/2018  . Vertigo 04/13/2018  . Prediabetes 10/17/2015  . Hypertriglyceridemia 10/17/2015  . Lumbar disc herniation 10/25/2014  . Spinal stenosis, lumbar region,  with neurogenic claudication 10/24/2014  . Varicose veins of right leg with edema 01/30/2014   Lulu Riding PT, DPT, LAT, ATC  09/08/18  5:37 PM      [facility] [address] [place], [place], [address] Phone: [phone]   Fax:  [phone]  Name: GRICEL COPEN MRN: [id number] Date of Birth: January 29, 1966

## 2018-09-09 ENCOUNTER — Ambulatory Visit: Payer: BLUE CROSS/BLUE SHIELD | Admitting: Physical Therapy

## 2018-09-10 ENCOUNTER — Encounter

## 2018-09-14 ENCOUNTER — Encounter: Payer: Self-pay | Admitting: Physical Therapy

## 2018-09-14 ENCOUNTER — Ambulatory Visit: Payer: BLUE CROSS/BLUE SHIELD | Admitting: Physical Therapy

## 2018-09-14 DIAGNOSIS — R2689 Other abnormalities of gait and mobility: Secondary | ICD-10-CM | POA: Diagnosis not present

## 2018-09-14 DIAGNOSIS — G8929 Other chronic pain: Secondary | ICD-10-CM

## 2018-09-14 DIAGNOSIS — M25571 Pain in right ankle and joints of right foot: Secondary | ICD-10-CM

## 2018-09-14 DIAGNOSIS — M6281 Muscle weakness (generalized): Secondary | ICD-10-CM

## 2018-09-14 DIAGNOSIS — M25511 Pain in right shoulder: Principal | ICD-10-CM

## 2018-09-14 NOTE — Therapy (Signed)
[facility] [address] [place], [place], [address] Phone: [phone]   Fax:  [phone]  Physical Therapy Treatment  Patient Details  Name: Kendra Rodriguez MRN: [id number] Date of Birth: April 30, 1966 Referring Provider (PT): [name]   Encounter Date: 09/14/2018  PT End of Session - 09/14/18 0939    Visit Number  3    Number of Visits  17    Date for PT Re-Evaluation  10/26/18    PT Start Time  0846    PT Stop Time  0930    PT Time Calculation (min)  44 min    Activity Tolerance  Patient tolerated treatment well    Behavior During Therapy  [facility] for tasks assessed/performed       Past Medical History:  Diagnosis Date  . Acute sinusitis   . Allergy   . Anxiety   . Depression    NO MEDICATIONS - DOING OK NOW  . Dizziness   . Ear ache    RIGHT --HX OF MULTIPLE EAR INFECTIONS AS A CHILD   . GERD (gastroesophageal reflux disease)   . Headache(784.0)    MIGRAINES  . Leg pain, left    with swelling  . Lumbar stenosis    PAIN BACK AND LEFT LEG AND NUMBNESS LEG AND FOOT  . Peripheral vascular disease (HCC)    HX OF TREATMENT FOR VARICOSE VEINS RIGHT LEG  . Thyroid disease    TOLD BLOOD LEVELS SLIGHTLY ABNORMAL - BUT NOT REQUIRED MEDICATION    Past Surgical History:  Procedure Laterality Date  . ABDOMINAL HYSTERECTOMY    . bladder lift     AT SAME TIME OF HYSTERECTOMY  . CHOLECYSTECTOMY    . LUMBAR LAMINECTOMY/DECOMPRESSION MICRODISCECTOMY Left 10/24/2014   Procedure: COMPLETE LUMBAR DECOMPRESSION L4-L5 CENTRAL AND MICRODISCECTOMY L4-L5 LEFT;  Surgeon: Jacki Cones, MD;  Location: [facility];  Service: Orthopedics;  Laterality: Left;    There were no vitals filed for this visit.  Subjective Assessment - 09/14/18 0847    Subjective  "since yesterday my shoulder is more sore, and the Foot is more sore which is burning    Currently in Pain?  Yes    Pain Score  6     Pain Orientation  Right    Pain Descriptors  / Indicators  Aching    Pain Type  Chronic pain    Pain Onset  More than a month ago    Pain Frequency  Intermittent    Pain Score  6    Pain Location  Ankle    Pain Orientation  Right    Pain Descriptors / Indicators  Burning;Sore    Pain Type  Chronic pain    Pain Onset  More than a month ago    Pain Frequency  Intermittent    Aggravating Factors   standing/ walking                       [facility] Adult PT Treatment/Exercise - 09/14/18 0854      Shoulder Exercises: Seated   Other Seated Exercises  UE ranger 2 x 10 protaction with yellow band resistance      Shoulder Exercises: Standing   Flexion  Right;10 reps;Other (comment)   combined with scapular assist      Iontophoresis   Type of Iontophoresis  Dexamethasone    Location  long head of the bicep tendon on R     Dose  4mg /ml  Time  6 hour stat patch      Manual Therapy   Manual Therapy  Scapular mobilization    Manual therapy comments  skilled palpation and monitoring pt throghout TPDN    Joint Mobilization  R ankle distraction mobs grade 4 & 5,  R shoulder scapular mobs grade 3 in sitting     Soft tissue mobilization  IASTM along R peroneal brevis and tertius, and biceps brachii    Scapular Mobilization  upward rotation performed with active flexion/ abduction    Other Manual Therapy  MTPR along R rhomboids x upper trap on R x 2 ea.       Trigger Point Dry Needling - 09/14/18 [id number]    Consent Given?  Yes    Education Handout Provided  No    Muscles Treated Upper Body  Rhomboids;Pectoralis major;Upper trapezius    Upper Trapezius Response  Twitch reponse elicited;Palpable increased muscle length   r only   Pectoralis Major Response  Twitch response elicited;Palpable increased muscle length   R only   Rhomboids Response  Twitch response elicited;Palpable increased muscle length   R only          PT Education - 09/14/18 0937    Education Details  updated HEP for ceiling punches and lower trap  strength    Person(s) Educated  Patient    Methods  Explanation;Verbal cues;Handout    Comprehension  Verbalized understanding;Verbal cues required       PT Short Term Goals - 08/31/18 1531      PT SHORT TERM GOAL #1   Title  pt to be I with inital HEP    Time  4    Period  Weeks    Status  On-going    Target Date  09/28/18      PT SHORT TERM GOAL #2   Title  pt to verbalize techniques to calm down R shoulder and foot pain via RICE and HEP    Time  4    Period  Weeks    Status  New    Target Date  09/28/18      PT SHORT TERM GOAL #3   Title  pt to increase R grip strength by >/= 10# to demo improvement in shoulder function    Time  4    Period  Weeks    Status  New    Target Date  09/28/18      PT SHORT TERM GOAL #4   Title  pt to be able to perofrm grooming and dressing with  </= 5/10 pain in the R shoulder    Time  4    Period  Weeks    Status  New    Target Date  09/28/18        PT Long Term Goals - 08/31/18 1532      PT LONG TERM GOAL #1   Title  increase R shoulder abduction/ flexion to >/= 130 and IR to L1, and ER to C6 to promote functional mobility required for ADLs with </= 2/10 pain     Time  8    Period  Weeks    Status  New    Target Date  10/26/18      PT LONG TERM GOAL #2   Title  increase R shoulder strength to >/= 4/5 to promote functional lifting and carrying for ADLs with </= 2/10 pain     Time  8    Period  Weeks  Status  New    Target Date  09/28/18      PT LONG TERM GOAL #3   Title  increase R ankle strength to >/= 5/5 in all planes to promote stability with walking/ standing activities and maximize gait efficency    Time  8    Period  Weeks    Status  New    Target Date  10/26/18      PT LONG TERM GOAL #4   Title  pt to increase FOTO score </= 35% limited to demo improvement in function     Time  8    Period  Weeks    Status  New    Target Date  10/26/18      PT LONG TERM GOAL #5   Title  pt to be able to stand/ walk >/=  60 min with </= 2/10 pain for functional endurance required for household tasks     Time  8    Period  Weeks    Status  New    Target Date  10/26/18      Additional Long Term Goals   Additional Long Term Goals  --      PT LONG TERM GOAL #6   Title  pt to be i with all HEP given as of last visit to maintain and progress current function independently    Time  8    Period  Weeks    Status  New    Target Date  10/26/18            Plan - 09/14/18 0940    Clinical Impression Statement  increased soreness in the R shoulder and R ankle. DN was performed along peroneal tertius and bicep followed with IASTM. Continued working on scapulohumeral rhythm and shoulder ROM which she noted improvement of pain in the shoulder to 3/10 and reported no pain inthe R ankle. trialed placemen to of the ionto patch along the long head of the biceps tendon.     PT Treatment/Interventions  ADLs/Self Care Home Management;Cryotherapy;Electrical Stimulation;Iontophoresis 4mg /ml Dexamethasone;Moist Heat;Ultrasound;Therapeutic activities;Therapeutic exercise;Manual techniques;Taping;Vasopneumatic Device;Dry needling;Passive range of motion;Patient/family education;Gait training;Balance training    PT Next Visit Plan  update HEP, shoulder ROM, STW, scapular mobs, peri-scapular strengthening.     PT Home Exercise Plan  upper trap stretch, scapular retraction, calf stretch, table slides flexion/ abduction    Consulted and Agree with Plan of Care  Patient       Patient will benefit from skilled therapeutic intervention in order to improve the following deficits and impairments:  Pain, Decreased endurance, Decreased strength, Increased fascial restricitons, Difficulty walking, Postural dysfunction, Improper body mechanics, Decreased activity tolerance, Decreased range of motion, Abnormal gait, Increased edema  Visit Diagnosis: Chronic right shoulder pain  Muscle weakness (generalized)  Pain in right ankle and  joints of right foot  Other abnormalities of gait and mobility     Problem List Patient Active Problem List   Diagnosis Date Noted  . Right ear pain 04/13/2018  . Vertigo 04/13/2018  . Prediabetes 10/17/2015  . Hypertriglyceridemia 10/17/2015  . Lumbar disc herniation 10/25/2014  . Spinal stenosis, lumbar region, with neurogenic claudication 10/24/2014  . Varicose veins of right leg with edema 01/30/2014   Kendra Rodriguez PT, DPT, LAT, ATC  09/14/18  9:48 AM      [facility] [address] [place], [place], [address] Phone: [phone]   Fax:  [phone]  Name: Kendra Rodriguez MRN: [id number]  Date of Birth: [birth year]

## 2018-09-16 ENCOUNTER — Ambulatory Visit: Payer: BLUE CROSS/BLUE SHIELD | Admitting: Physical Therapy

## 2018-09-16 ENCOUNTER — Encounter: Payer: Self-pay | Admitting: Physical Therapy

## 2018-09-16 DIAGNOSIS — M6281 Muscle weakness (generalized): Secondary | ICD-10-CM | POA: Diagnosis not present

## 2018-09-16 DIAGNOSIS — M25571 Pain in right ankle and joints of right foot: Secondary | ICD-10-CM

## 2018-09-16 DIAGNOSIS — M25511 Pain in right shoulder: Secondary | ICD-10-CM | POA: Diagnosis not present

## 2018-09-16 DIAGNOSIS — R2689 Other abnormalities of gait and mobility: Secondary | ICD-10-CM | POA: Diagnosis not present

## 2018-09-16 DIAGNOSIS — G8929 Other chronic pain: Secondary | ICD-10-CM | POA: Diagnosis not present

## 2018-09-16 NOTE — Therapy (Signed)
[facility] Outpatient Rehabilitation [facility] [address] [place], [place], [address] Phone: [phone]   Fax:  [phone]  Physical Therapy Treatment  Patient Details  Name: Kendra Rodriguez MRN: [id number] Date of Birth: 1966-01-11 Referring Provider (PT): [name]   Encounter Date: 09/16/2018  PT End of Session - 09/16/18 1413    Visit Number  4    Number of Visits  17    Date for PT Re-Evaluation  10/26/18    PT Start Time  1413    PT Stop Time  1500    PT Time Calculation (min)  47 min    Activity Tolerance  Patient tolerated treatment well    Behavior During Therapy  [facility] for tasks assessed/performed       Past Medical History:  Diagnosis Date  . Acute sinusitis   . Allergy   . Anxiety   . Depression    NO MEDICATIONS - DOING OK NOW  . Dizziness   . Ear ache    RIGHT --HX OF MULTIPLE EAR INFECTIONS AS A CHILD   . GERD (gastroesophageal reflux disease)   . Headache(784.0)    MIGRAINES  . Leg pain, left    with swelling  . Lumbar stenosis    PAIN BACK AND LEFT LEG AND NUMBNESS LEG AND FOOT  . Peripheral vascular disease (HCC)    HX OF TREATMENT FOR VARICOSE VEINS RIGHT LEG  . Thyroid disease    TOLD BLOOD LEVELS SLIGHTLY ABNORMAL - BUT NOT REQUIRED MEDICATION    Past Surgical History:  Procedure Laterality Date  . ABDOMINAL HYSTERECTOMY    . bladder lift     AT SAME TIME OF HYSTERECTOMY  . CHOLECYSTECTOMY    . LUMBAR LAMINECTOMY/DECOMPRESSION MICRODISCECTOMY Left 10/24/2014   Procedure: COMPLETE LUMBAR DECOMPRESSION L4-L5 CENTRAL AND MICRODISCECTOMY L4-L5 LEFT;  Surgeon: Jacki Cones, MD;  Location: [facility];  Service: Orthopedics;  Laterality: Left;    There were no vitals filed for this visit.  Subjective Assessment - 09/16/18 1413    Subjective  "I feeling better today, the R shoulder is only 3/10, and 5 /10 in the ankle"     Currently in Pain?  Yes    Pain Score  3     Pain Location  Shoulder    Pain Orientation  Right     Pain Descriptors / Indicators  Sore    Pain Onset  More than a month ago    Pain Frequency  Intermittent    Aggravating Factors   doing too much     Pain Relieving Factors  laying down, ointment, exercise     Pain Score  5    Pain Location  Ankle    Pain Orientation  Right    Pain Descriptors / Indicators  Sore;Aching                       [facility] Adult PT Treatment/Exercise - 09/16/18 1423      Exercises   Exercises  Shoulder;Ankle      Shoulder Exercises: Seated   Other Seated Exercises  scapular retraction with ER 2 x 15 with red theraband      Shoulder Exercises: Standing   Other Standing Exercises  scaption 2 x 15       Shoulder Exercises: ROM/Strengthening   UBE (Upper Arm Bike)  L1 x 4 min    changing direction at 2 min      Iontophoresis   Type of  Iontophoresis  Dexamethasone    Location  long head of the bicep tendon on R     Dose  4mg /ml    Time  6 hour stat patch      Manual Therapy   Manual Therapy  Taping    Manual therapy comments  skilled palpation and monitoring pt throghout TPDN    Joint Mobilization  R ankle distraction mobs grade 4 & 5,  R shoulder scapular mobs grade 3 in sitting     Soft tissue mobilization  IASTM along posterior tib and upper trap    Scapular Mobilization  upward rotation performed with active flexion/ abduction    McConnell  arch support       Ankle Exercises: Stretches   Gastroc Stretch  2 reps;30 seconds   slant board     Ankle Exercises: Seated   Other Seated Ankle Exercises  heel raise with inverions for posterior tib strength 2 x 15       Trigger Point Dry Needling - 09/16/18 1425    Consent Given?  Yes    Education Handout Provided  No   given previously   Muscles Treated Upper Body  Subscapularis    Muscles Treated Lower Body  --   posterior tib on the R   Upper Trapezius Response  Twitch reponse elicited;Palpable increased muscle length    Subscapularis Response  Twitch response  elicited;Palpable increased muscle length             PT Short Term Goals - 08/31/18 1531      PT SHORT TERM GOAL #1   Title  pt to be I with inital HEP    Time  4    Period  Weeks    Status  On-going    Target Date  09/28/18      PT SHORT TERM GOAL #2   Title  pt to verbalize techniques to calm down R shoulder and foot pain via RICE and HEP    Time  4    Period  Weeks    Status  New    Target Date  09/28/18      PT SHORT TERM GOAL #3   Title  pt to increase R grip strength by >/= 10# to demo improvement in shoulder function    Time  4    Period  Weeks    Status  New    Target Date  09/28/18      PT SHORT TERM GOAL #4   Title  pt to be able to perofrm grooming and dressing with  </= 5/10 pain in the R shoulder    Time  4    Period  Weeks    Status  New    Target Date  09/28/18        PT Long Term Goals - 08/31/18 1532      PT LONG TERM GOAL #1   Title  increase R shoulder abduction/ flexion to >/= 130 and IR to L1, and ER to C6 to promote functional mobility required for ADLs with </= 2/10 pain     Time  8    Period  Weeks    Status  New    Target Date  10/26/18      PT LONG TERM GOAL #2   Title  increase R shoulder strength to >/= 4/5 to promote functional lifting and carrying for ADLs with </= 2/10 pain     Time  8    Period  Weeks    Status  New    Target Date  09/28/18      PT LONG TERM GOAL #3   Title  increase R ankle strength to >/= 5/5 in all planes to promote stability with walking/ standing activities and maximize gait efficency    Time  8    Period  Weeks    Status  New    Target Date  10/26/18      PT LONG TERM GOAL #4   Title  pt to increase FOTO score </= 35% limited to demo improvement in function     Time  8    Period  Weeks    Status  New    Target Date  10/26/18      PT LONG TERM GOAL #5   Title  pt to be able to stand/ walk >/= 60 min with </= 2/10 pain for functional endurance required for household tasks     Time  8     Period  Weeks    Status  New    Target Date  10/26/18      Additional Long Term Goals   Additional Long Term Goals  --      PT LONG TERM GOAL #6   Title  pt to be i with all HEP given as of last visit to maintain and progress current function independently    Time  8    Period  Weeks    Status  New    Target Date  10/26/18            Plan - 09/16/18 1500    Clinical Impression Statement  imrpovement noted today with pain inthe shoulder and ankle. continued DN along the subscapularis, proximal bicep and R tibialis posterior followed with IASTM. conitnued shoulder strengthening, and trialed arch taping which she reported decreased pain with standing/ walking. pt reported 2-3/10 pain following session today.     PT Next Visit Plan  update HEP, shoulder ROM, STW, scapular mobs, peri-scapular strengthening.     PT Home Exercise Plan  upper trap stretch, scapular retraction, calf stretch, table slides flexion/ abduction    Consulted and Agree with Plan of Care  Patient       Patient will benefit from skilled therapeutic intervention in order to improve the following deficits and impairments:  Pain, Decreased endurance, Decreased strength, Increased fascial restricitons, Difficulty walking, Postural dysfunction, Improper body mechanics, Decreased activity tolerance, Decreased range of motion, Abnormal gait, Increased edema  Visit Diagnosis: Chronic right shoulder pain  Muscle weakness (generalized)  Pain in right ankle and joints of right foot  Other abnormalities of gait and mobility     Problem List Patient Active Problem List   Diagnosis Date Noted  . Right ear pain 04/13/2018  . Vertigo 04/13/2018  . Prediabetes 10/17/2015  . Hypertriglyceridemia 10/17/2015  . Lumbar disc herniation 10/25/2014  . Spinal stenosis, lumbar region, with neurogenic claudication 10/24/2014  . Varicose veins of right leg with edema 01/30/2014   Lulu RidingKristoffer Jadda Hunsucker PT, DPT, LAT, ATC   09/16/18  3:02 PM      [facility] [address] [place], [place], [address] Phone: [phone]   Fax:  [phone]  Name: Kendra Rodriguez MRN: [id number] Date of Birth: 26-May-1966

## 2018-09-17 ENCOUNTER — Encounter (INDEPENDENT_AMBULATORY_CARE_PROVIDER_SITE_OTHER): Payer: Self-pay | Admitting: Physician Assistant

## 2018-09-17 ENCOUNTER — Telehealth (INDEPENDENT_AMBULATORY_CARE_PROVIDER_SITE_OTHER): Payer: Self-pay | Admitting: Physician Assistant

## 2018-09-17 NOTE — Telephone Encounter (Signed)
FWD to PCP. Kendra Rodriguez Suman Trivedi, CMA  

## 2018-09-17 NOTE — Telephone Encounter (Signed)
I have written letter. Pt's husband will pick up along with his letter.

## 2018-09-17 NOTE — Telephone Encounter (Signed)
Patient called requesting a letter form her PCP specifying her current diagnosis and current med list. Patient needs this letter for immigration purposes. If you have any questions please call [phone].

## 2018-09-20 ENCOUNTER — Ambulatory Visit: Payer: BLUE CROSS/BLUE SHIELD | Admitting: Physical Therapy

## 2018-09-20 ENCOUNTER — Encounter: Payer: Self-pay | Admitting: Physical Therapy

## 2018-09-20 DIAGNOSIS — M6281 Muscle weakness (generalized): Secondary | ICD-10-CM | POA: Diagnosis not present

## 2018-09-20 DIAGNOSIS — G8929 Other chronic pain: Secondary | ICD-10-CM

## 2018-09-20 DIAGNOSIS — R2689 Other abnormalities of gait and mobility: Secondary | ICD-10-CM | POA: Diagnosis not present

## 2018-09-20 DIAGNOSIS — M25511 Pain in right shoulder: Secondary | ICD-10-CM | POA: Diagnosis not present

## 2018-09-20 DIAGNOSIS — M25571 Pain in right ankle and joints of right foot: Secondary | ICD-10-CM

## 2018-09-20 NOTE — Therapy (Signed)
[facility] [address] [place], [place], [address] Phone: [phone]   Fax:  [phone]  Physical Therapy Treatment  Patient Details  Name: Kendra Rodriguez MRN: [id number] Date of Birth: [birth year] Referring Provider (PT): [name]   Encounter Date: 09/20/2018  PT End of Session - 09/20/18 1414    Visit Number  5    Number of Visits  17    Date for PT Re-Evaluation  10/26/18    PT Start Time  1415    PT Stop Time  1458    PT Time Calculation (min)  43 min    Activity Tolerance  Patient tolerated treatment well    Behavior During Therapy  [employer] for tasks assessed/performed       Past Medical History:  Diagnosis Date  . Acute sinusitis   . Allergy   . Anxiety   . Depression    NO MEDICATIONS - DOING OK NOW  . Dizziness   . Ear ache    RIGHT --HX OF MULTIPLE EAR INFECTIONS AS A CHILD   . GERD (gastroesophageal reflux disease)   . Headache(784.0)    MIGRAINES  . Leg pain, left    with swelling  . Lumbar stenosis    PAIN BACK AND LEFT LEG AND NUMBNESS LEG AND FOOT  . Peripheral vascular disease (HCC)    HX OF TREATMENT FOR VARICOSE VEINS RIGHT LEG  . Thyroid disease    TOLD BLOOD LEVELS SLIGHTLY ABNORMAL - BUT NOT REQUIRED MEDICATION    Past Surgical History:  Procedure Laterality Date  . ABDOMINAL HYSTERECTOMY    . bladder lift     AT SAME TIME OF HYSTERECTOMY  . CHOLECYSTECTOMY    . LUMBAR LAMINECTOMY/DECOMPRESSION MICRODISCECTOMY Left 10/24/2014   Procedure: COMPLETE LUMBAR DECOMPRESSION L4-L5 CENTRAL AND MICRODISCECTOMY L4-L5 LEFT;  Surgeon: Jacki Cones, MD;  Location: [facility];  Service: Orthopedics;  Laterality: Left;    There were no vitals filed for this visit.  Subjective Assessment - 09/20/18 1414    Subjective  " I feel like I am doing better    Currently in Pain?  Yes    Pain Score  4     Pain Orientation  Right    Pain Descriptors / Indicators  Sore    Pain Type  Chronic pain    Pain Onset  More than a month ago    Pain Frequency  Intermittent    Pain Score  4    Pain Location  Ankle    Pain Orientation  Right    Pain Descriptors / Indicators  Aching    Pain Onset  More than a month ago    Pain Frequency  Intermittent                       [facility] Adult PT Treatment/Exercise - 09/20/18 1417      Knee/Hip Exercises: Sidelying   Hip ABduction  2 sets;10 reps;Strengthening;Right      Shoulder Exercises: Sidelying   Other Sidelying Exercises  abduction 2 x 15 RUE only in L sidelying      Shoulder Exercises: Standing   External Rotation  10 reps;Strengthening;Theraband    Theraband Level (Shoulder External Rotation)  Level 2 (Red)    Internal Rotation  10 reps;Strengthening;Right;Theraband    Theraband Level (Shoulder Internal Rotation)  Level 2 (Red)      Shoulder Exercises: ROM/Strengthening   Nustep  L5 x 5 min UE/LE  Manual Therapy   Soft tissue mobilization  IASTM along posterior tib and upper trap    Scapular Mobilization  upward rotation performed with active sidelying R shoulder abduction    McConnell  arch support taping on the R arch      Ankle Exercises: Seated   Towel Crunch  3 reps   RLE only            PT Education - 09/20/18 1500    Education Details  updated HEP for shoulder strengthening.     Person(s) Educated  Patient    Methods  Explanation;Verbal cues;Handout    Comprehension  Verbalized understanding;Verbal cues required       PT Short Term Goals - 08/31/18 1531      PT SHORT TERM GOAL #1   Title  pt to be I with inital HEP    Time  4    Period  Weeks    Status  On-going    Target Date  09/28/18      PT SHORT TERM GOAL #2   Title  pt to verbalize techniques to calm down R shoulder and foot pain via RICE and HEP    Time  4    Period  Weeks    Status  New    Target Date  09/28/18      PT SHORT TERM GOAL #3   Title  pt to increase R grip strength by >/= 10# to demo improvement in shoulder  function    Time  4    Period  Weeks    Status  New    Target Date  09/28/18      PT SHORT TERM GOAL #4   Title  pt to be able to perofrm grooming and dressing with  </= 5/10 pain in the R shoulder    Time  4    Period  Weeks    Status  New    Target Date  09/28/18        PT Long Term Goals - 08/31/18 1532      PT LONG TERM GOAL #1   Title  increase R shoulder abduction/ flexion to >/= 130 and IR to L1, and ER to C6 to promote functional mobility required for ADLs with </= 2/10 pain     Time  8    Period  Weeks    Status  New    Target Date  10/26/18      PT LONG TERM GOAL #2   Title  increase R shoulder strength to >/= 4/5 to promote functional lifting and carrying for ADLs with </= 2/10 pain     Time  8    Period  Weeks    Status  New    Target Date  09/28/18      PT LONG TERM GOAL #3   Title  increase R ankle strength to >/= 5/5 in all planes to promote stability with walking/ standing activities and maximize gait efficency    Time  8    Period  Weeks    Status  New    Target Date  10/26/18      PT LONG TERM GOAL #4   Title  pt to increase FOTO score </= 35% limited to demo improvement in function     Time  8    Period  Weeks    Status  New    Target Date  10/26/18      PT LONG TERM GOAL #  5   Title  pt to be able to stand/ walk >/= 60 min with </= 2/10 pain for functional endurance required for household tasks     Time  8    Period  Weeks    Status  New    Target Date  10/26/18      Additional Long Term Goals   Additional Long Term Goals  --      PT LONG TERM GOAL #6   Title  pt to be i with all HEP given as of last visit to maintain and progress current function independently    Time  8    Period  Weeks    Status  New    Target Date  10/26/18            Plan - 09/20/18 1458    Clinical Impression Statement  Kendra Rodriguez continues to make improvement reporting decreased pain/ burning in the R foot and pain only at 3-4/10 in the R shoulder.  Continued arch taping to promote support with standing, and strengthening to reduce arch drop. worked on shoulder stability which she reported decreased pain at 2/10. trialed holding off on ionto to assess pt progress.     PT Treatment/Interventions  ADLs/Self Care Home Management;Cryotherapy;Electrical Stimulation;Iontophoresis 4mg /ml Dexamethasone;Moist Heat;Ultrasound;Therapeutic activities;Therapeutic exercise;Manual techniques;Taping;Vasopneumatic Device;Dry needling;Passive range of motion;Patient/family education;Gait training;Balance training    PT Next Visit Plan  update HEP, shoulder ROM, STW, scapular mobs, peri-scapular strengthening.     PT Home Exercise Plan  upper trap stretch, scapular retraction, calf stretch, table slides flexion/ abduction, shoulder IR/ER, rows     Consulted and Agree with Plan of Care  Patient       Patient will benefit from skilled therapeutic intervention in order to improve the following deficits and impairments:  Pain, Decreased endurance, Decreased strength, Increased fascial restricitons, Difficulty walking, Postural dysfunction, Improper body mechanics, Decreased activity tolerance, Decreased range of motion, Abnormal gait, Increased edema  Visit Diagnosis: Chronic right shoulder pain  Muscle weakness (generalized)  Pain in right ankle and joints of right foot  Other abnormalities of gait and mobility     Problem List Patient Active Problem List   Diagnosis Date Noted  . Right ear pain 04/13/2018  . Vertigo 04/13/2018  . Prediabetes 10/17/2015  . Hypertriglyceridemia 10/17/2015  . Lumbar disc herniation 10/25/2014  . Spinal stenosis, lumbar region, with neurogenic claudication 10/24/2014  . Varicose veins of right leg with edema 01/30/2014   Lulu RidingKristoffer Tinie Mcgloin PT, DPT, LAT, ATC  09/20/18  3:01 PM      [facility] [address] [place], [place], [address] Phone: [phone]   Fax:   [phone]  Name: Kendra Rodriguez MRN: [id number] Date of Birth: 1966/02/22

## 2018-09-22 ENCOUNTER — Ambulatory Visit: Payer: BLUE CROSS/BLUE SHIELD | Admitting: Physical Therapy

## 2018-09-22 ENCOUNTER — Encounter: Payer: Self-pay | Admitting: Physical Therapy

## 2018-09-22 DIAGNOSIS — M6281 Muscle weakness (generalized): Secondary | ICD-10-CM | POA: Diagnosis not present

## 2018-09-22 DIAGNOSIS — G8929 Other chronic pain: Secondary | ICD-10-CM

## 2018-09-22 DIAGNOSIS — M25511 Pain in right shoulder: Principal | ICD-10-CM

## 2018-09-22 DIAGNOSIS — M25571 Pain in right ankle and joints of right foot: Secondary | ICD-10-CM | POA: Diagnosis not present

## 2018-09-22 DIAGNOSIS — R2689 Other abnormalities of gait and mobility: Secondary | ICD-10-CM | POA: Diagnosis not present

## 2018-09-22 NOTE — Therapy (Signed)
[facility] [address] [place], [place], [address] Phone: [phone]   Fax:  [phone]  Physical Therapy Treatment  Patient Details  Name: Kendra Rodriguez MRN: [id number] Date of Birth: [birth year] Referring Provider (PT): Vira BrownsJames Nitka MD   Encounter Date: 09/22/2018  PT End of Session - 09/22/18 1418    Visit Number  6    Number of Visits  17    Date for PT Re-Evaluation  10/26/18    PT Start Time  1418    PT Stop Time  1501    PT Time Calculation (min)  43 min    Activity Tolerance  Patient tolerated treatment well    Behavior During Therapy  [facility] for tasks assessed/performed       Past Medical History:  Diagnosis Date  . Acute sinusitis   . Allergy   . Anxiety   . Depression    NO MEDICATIONS - DOING OK NOW  . Dizziness   . Ear ache    RIGHT --HX OF MULTIPLE EAR INFECTIONS AS A CHILD   . GERD (gastroesophageal reflux disease)   . Headache(784.0)    MIGRAINES  . Leg pain, left    with swelling  . Lumbar stenosis    PAIN BACK AND LEFT LEG AND NUMBNESS LEG AND FOOT  . Peripheral vascular disease (HCC)    HX OF TREATMENT FOR VARICOSE VEINS RIGHT LEG  . Thyroid disease    TOLD BLOOD LEVELS SLIGHTLY ABNORMAL - BUT NOT REQUIRED MEDICATION    Past Surgical History:  Procedure Laterality Date  . ABDOMINAL HYSTERECTOMY    . bladder lift     AT SAME TIME OF HYSTERECTOMY  . CHOLECYSTECTOMY    . LUMBAR LAMINECTOMY/DECOMPRESSION MICRODISCECTOMY Left 10/24/2014   Procedure: COMPLETE LUMBAR DECOMPRESSION L4-L5 CENTRAL AND MICRODISCECTOMY L4-L5 LEFT;  Surgeon: Jacki Conesonald A Gioffre, MD;  Location: [facility];  Service: Orthopedics;  Laterality: Left;    There were no vitals filed for this visit.  Subjective Assessment - 09/22/18 1419    Subjective  " I am feeling more or less the R shoulder I    Currently in Pain?  Yes    Pain Score  6     Pain Location  Shoulder    Pain Orientation  Right    Pain Type  Chronic pain    Pain Onset  More than a month ago    Pain Frequency  Intermittent    Pain Score  4    Pain Location  Ankle    Pain Orientation  Right    Pain Type  Chronic pain    Pain Frequency  Intermittent    Aggravating Factors   standing/ walking    Pain Relieving Factors  taping                       [facility] Adult PT Treatment/Exercise - 09/22/18 0001      Exercises   Exercises  Shoulder;Ankle      Shoulder Exercises: Seated   Horizontal ABduction  Strengthening;Both;15 reps;Theraband    Theraband Level (Shoulder Horizontal ABduction)  Level 3 (Green)    Other Seated Exercises  rib mob grade 3 using towel      Shoulder Exercises: Standing   Other Standing Exercises  lower trap y's 2 x 15     Other Standing Exercises  scaption 2 x 10 1# bil       Manual Therapy   Manual therapy comments  MTPR along rhomboids and upper trap, and scalenes, and for R ankle posterior tib and peroneal longus.     Joint Mobilization  R 1st rib mob grade 3      Ankle Exercises: Seated   Other Seated Ankle Exercises  heel raise with inverions for posterior tib strength 2 x 15 with red theraband around                PT Short Term Goals - 08/31/18 1531      PT SHORT TERM GOAL #1   Title  pt to be I with inital HEP    Time  4    Period  Weeks    Status  On-going    Target Date  09/28/18      PT SHORT TERM GOAL #2   Title  pt to verbalize techniques to calm down R shoulder and foot pain via RICE and HEP    Time  4    Period  Weeks    Status  New    Target Date  09/28/18      PT SHORT TERM GOAL #3   Title  pt to increase R grip strength by >/= 10# to demo improvement in shoulder function    Time  4    Period  Weeks    Status  New    Target Date  09/28/18      PT SHORT TERM GOAL #4   Title  pt to be able to perofrm grooming and dressing with  </= 5/10 pain in the R shoulder    Time  4    Period  Weeks    Status  New    Target Date  09/28/18        PT Long Term Goals -  08/31/18 1532      PT LONG TERM GOAL #1   Title  increase R shoulder abduction/ flexion to >/= 130 and IR to L1, and ER to C6 to promote functional mobility required for ADLs with </= 2/10 pain     Time  8    Period  Weeks    Status  New    Target Date  10/26/18      PT LONG TERM GOAL #2   Title  increase R shoulder strength to >/= 4/5 to promote functional lifting and carrying for ADLs with </= 2/10 pain     Time  8    Period  Weeks    Status  New    Target Date  09/28/18      PT LONG TERM GOAL #3   Title  increase R ankle strength to >/= 5/5 in all planes to promote stability with walking/ standing activities and maximize gait efficency    Time  8    Period  Weeks    Status  New    Target Date  10/26/18      PT LONG TERM GOAL #4   Title  pt to increase FOTO score </= 35% limited to demo improvement in function     Time  8    Period  Weeks    Status  New    Target Date  10/26/18      PT LONG TERM GOAL #5   Title  pt to be able to stand/ walk >/= 60 min with </= 2/10 pain for functional endurance required for household tasks     Time  8    Period  Weeks    Status  New    Target Date  10/26/18      Additional Long Term Goals   Additional Long Term Goals  --      PT LONG TERM GOAL #6   Title  pt to be i with all HEP given as of last visit to maintain and progress current function independently    Time  8    Period  Weeks    Status  New    Target Date  10/26/18            Plan - 09/22/18 1518    Clinical Impression Statement  Continued soft tissue work for the R upper trap, scalenes which reproduced symptoms she was feeling, and perfromed rib mobs which she stated reduced pain to a 3-4/10. continued STW along posterior tib and peroneals and pt noted no pain or burning following session.     PT Next Visit Plan  Reassessment, update HEP, shoulder ROM, STW, scapular mobs, peri-scapular strengthening.     PT Home Exercise Plan  upper trap stretch, scapular  retraction, calf stretch, table slides flexion/ abduction, shoulder IR/ER, rows horizontal abduction, rib mobs     Consulted and Agree with Plan of Care  Patient       Patient will benefit from skilled therapeutic intervention in order to improve the following deficits and impairments:     Visit Diagnosis: Chronic right shoulder pain  Muscle weakness (generalized)  Pain in right ankle and joints of right foot     Problem List Patient Active Problem List   Diagnosis Date Noted  . Right ear pain 04/13/2018  . Vertigo 04/13/2018  . Prediabetes 10/17/2015  . Hypertriglyceridemia 10/17/2015  . Lumbar disc herniation 10/25/2014  . Spinal stenosis, lumbar region, with neurogenic claudication 10/24/2014  . Varicose veins of right leg with edema 01/30/2014   Lulu Riding PT, DPT, LAT, ATC  09/22/18  3:22 PM      [facility] [address] [place], [place], [address] Phone: [phone]   Fax:  [phone]  Name: Kendra Rodriguez MRN: [id number] Date of Birth: September 27, 1966

## 2018-09-27 ENCOUNTER — Encounter: Payer: Self-pay | Admitting: Physical Therapy

## 2018-09-27 ENCOUNTER — Ambulatory Visit: Payer: BLUE CROSS/BLUE SHIELD | Admitting: Physical Therapy

## 2018-09-27 DIAGNOSIS — G8929 Other chronic pain: Secondary | ICD-10-CM

## 2018-09-27 DIAGNOSIS — R2689 Other abnormalities of gait and mobility: Secondary | ICD-10-CM | POA: Diagnosis not present

## 2018-09-27 DIAGNOSIS — M25511 Pain in right shoulder: Secondary | ICD-10-CM | POA: Diagnosis not present

## 2018-09-27 DIAGNOSIS — M25571 Pain in right ankle and joints of right foot: Secondary | ICD-10-CM | POA: Diagnosis not present

## 2018-09-27 DIAGNOSIS — M6281 Muscle weakness (generalized): Secondary | ICD-10-CM | POA: Diagnosis not present

## 2018-09-27 NOTE — Therapy (Signed)
[facility] [address] [place], [place], [address] Phone: [phone]   Fax:  [phone]  Physical Therapy Treatment  Patient Details  Name: Kendra Rodriguez MRN: [id number] Date of Birth: 1966/01/18 Referring Provider (PT): Vira BrownsJames Nitka MD   Encounter Date: 09/27/2018  PT End of Session - 09/27/18 1438    Visit Number  7    Number of Visits  17    Date for PT Re-Evaluation  10/26/18    PT Start Time  1418    PT Stop Time  1458    PT Time Calculation (min)  40 min    Activity Tolerance  Patient tolerated treatment well    Behavior During Therapy  [facility] for tasks assessed/performed       Past Medical History:  Diagnosis Date  . Acute sinusitis   . Allergy   . Anxiety   . Depression    NO MEDICATIONS - DOING OK NOW  . Dizziness   . Ear ache    RIGHT --HX OF MULTIPLE EAR INFECTIONS AS A CHILD   . GERD (gastroesophageal reflux disease)   . Headache(784.0)    MIGRAINES  . Leg pain, left    with swelling  . Lumbar stenosis    PAIN BACK AND LEFT LEG AND NUMBNESS LEG AND FOOT  . Peripheral vascular disease (HCC)    HX OF TREATMENT FOR VARICOSE VEINS RIGHT LEG  . Thyroid disease    TOLD BLOOD LEVELS SLIGHTLY ABNORMAL - BUT NOT REQUIRED MEDICATION    Past Surgical History:  Procedure Laterality Date  . ABDOMINAL HYSTERECTOMY    . bladder lift     AT SAME TIME OF HYSTERECTOMY  . CHOLECYSTECTOMY    . LUMBAR LAMINECTOMY/DECOMPRESSION MICRODISCECTOMY Left 10/24/2014   Procedure: COMPLETE LUMBAR DECOMPRESSION L4-L5 CENTRAL AND MICRODISCECTOMY L4-L5 LEFT;  Surgeon: Jacki Conesonald A Gioffre, MD;  Location: [facility];  Service: Orthopedics;  Laterality: Left;    There were no vitals filed for this visit.  Subjective Assessment - 09/27/18 1421    Subjective  "am doing better in the shoulder and reaching having only 3/10 in the hsoulder and the ankle/ foot is bothering more with burning in the     Currently in Pain?  Yes    Pain Score   3     Pain Orientation  Right    Aggravating Factors   reaching overhead     Pain Relieving Factors  laying down, exercise    Pain Score  5    Pain Location  Ankle    Pain Orientation  Right    Pain Descriptors / Indicators  Burning    Pain Type  Chronic pain    Pain Onset  More than a month ago    Pain Frequency  Intermittent    Aggravating Factors   standing/ walking                       [facility] Adult PT Treatment/Exercise - 09/27/18 0001      Exercises   Exercises  Shoulder;Ankle      Lumbar Exercises: Stretches   Passive Hamstring Stretch  2 reps   with 20 ankle pumps each for nerve glide     Shoulder Exercises: Seated   Row  Strengthening;Both;15 reps;Theraband   x 2 sets   Theraband Level (Shoulder Row)  Level 4 (Blue)    Horizontal ABduction  Strengthening;Both;15 reps;Theraband    Theraband Level (Shoulder Horizontal ABduction)  Level 4 (  Blue)      Manual Therapy   Manual Therapy  Neural Stretch    Manual therapy comments  MTPR along piriformis    Joint Mobilization  R 1st rib mob grade 3    Neural Stretch  sciatic nerve glides in sitting      Ankle Exercises: Stretches   Gastroc Stretch  2 reps;30 seconds             PT Education - 09/27/18 1438    Education Details  updated HEp for sciatic nerve glides    Person(s) Educated  Patient    Methods  Explanation;Verbal cues;Handout    Comprehension  Verbalized understanding;Verbal cues required       PT Short Term Goals - 08/31/18 1531      PT SHORT TERM GOAL #1   Title  pt to be I with inital HEP    Time  4    Period  Weeks    Status  On-going    Target Date  09/28/18      PT SHORT TERM GOAL #2   Title  pt to verbalize techniques to calm down R shoulder and foot pain via RICE and HEP    Time  4    Period  Weeks    Status  New    Target Date  09/28/18      PT SHORT TERM GOAL #3   Title  pt to increase R grip strength by >/= 10# to demo improvement in shoulder function    Time   4    Period  Weeks    Status  New    Target Date  09/28/18      PT SHORT TERM GOAL #4   Title  pt to be able to perofrm grooming and dressing with  </= 5/10 pain in the R shoulder    Time  4    Period  Weeks    Status  New    Target Date  09/28/18        PT Long Term Goals - 08/31/18 1532      PT LONG TERM GOAL #1   Title  increase R shoulder abduction/ flexion to >/= 130 and IR to L1, and ER to C6 to promote functional mobility required for ADLs with </= 2/10 pain     Time  8    Period  Weeks    Status  New    Target Date  10/26/18      PT LONG TERM GOAL #2   Title  increase R shoulder strength to >/= 4/5 to promote functional lifting and carrying for ADLs with </= 2/10 pain     Time  8    Period  Weeks    Status  New    Target Date  09/28/18      PT LONG TERM GOAL #3   Title  increase R ankle strength to >/= 5/5 in all planes to promote stability with walking/ standing activities and maximize gait efficency    Time  8    Period  Weeks    Status  New    Target Date  10/26/18      PT LONG TERM GOAL #4   Title  pt to increase FOTO score </= 35% limited to demo improvement in function     Time  8    Period  Weeks    Status  New    Target Date  10/26/18  PT LONG TERM GOAL #5   Title  pt to be able to stand/ walk >/= 60 min with </= 2/10 pain for functional endurance required for household tasks     Time  8    Period  Weeks    Status  New    Target Date  10/26/18      Additional Long Term Goals   Additional Long Term Goals  --      PT LONG TERM GOAL #6   Title  pt to be i with all HEP given as of last visit to maintain and progress current function independently    Time  8    Period  Weeks    Status  New    Target Date  10/26/18            Plan - 09/27/18 1459    Clinical Impression Statement  pt reprots improvement of hsoulder pain rating 3/10 only when reaching above her head. continued pain inthe R ankle rated at 5/10 and reported R posterior  hip pain. following stretching of the piriformis and nerve glides she reported pain dropped to 3/10 in the foot. updated HEP for sciatic nerve glides. plan to assess pt next visit and determine if more PT is necessary.     PT Treatment/Interventions  ADLs/Self Care Home Management;Cryotherapy;Electrical Stimulation;Iontophoresis 4mg /ml Dexamethasone;Moist Heat;Ultrasound;Therapeutic activities;Therapeutic exercise;Manual techniques;Taping;Vasopneumatic Device;Dry needling;Passive range of motion;Patient/family education;Gait training;Balance training    PT Next Visit Plan  Reassessment, update HEP, shoulder ROM, STW, scapular mobs, peri-scapular strengthening.     PT Home Exercise Plan  upper trap stretch, scapular retraction, calf stretch, table slides flexion/ abduction, shoulder IR/ER, rows horizontal abduction, rib mobs     Consulted and Agree with Plan of Care  Patient       Patient will benefit from skilled therapeutic intervention in order to improve the following deficits and impairments:  Pain, Decreased endurance, Decreased strength, Increased fascial restricitons, Difficulty walking, Postural dysfunction, Improper body mechanics, Decreased activity tolerance, Decreased range of motion, Abnormal gait, Increased edema  Visit Diagnosis: Chronic right shoulder pain  Muscle weakness (generalized)  Pain in right ankle and joints of right foot  Other abnormalities of gait and mobility     Problem List Patient Active Problem List   Diagnosis Date Noted  . Right ear pain 04/13/2018  . Vertigo 04/13/2018  . Prediabetes 10/17/2015  . Hypertriglyceridemia 10/17/2015  . Lumbar disc herniation 10/25/2014  . Spinal stenosis, lumbar region, with neurogenic claudication 10/24/2014  . Varicose veins of right leg with edema 01/30/2014   Kendra Rodriguez PT, DPT, LAT, ATC  09/27/18  3:03 PM      [facility] [address] [place], [place], [address] Phone: [phone]   Fax:  [phone]  Name: Kendra Rodriguez MRN: [id number] Date of Birth: October 23, 1966

## 2018-09-29 ENCOUNTER — Encounter: Payer: Self-pay | Admitting: Physical Therapy

## 2018-09-29 ENCOUNTER — Ambulatory Visit: Payer: BLUE CROSS/BLUE SHIELD | Admitting: Physical Therapy

## 2018-09-29 DIAGNOSIS — G8929 Other chronic pain: Secondary | ICD-10-CM

## 2018-09-29 DIAGNOSIS — M25571 Pain in right ankle and joints of right foot: Secondary | ICD-10-CM | POA: Diagnosis not present

## 2018-09-29 DIAGNOSIS — M25511 Pain in right shoulder: Secondary | ICD-10-CM | POA: Diagnosis not present

## 2018-09-29 DIAGNOSIS — M6281 Muscle weakness (generalized): Secondary | ICD-10-CM | POA: Diagnosis not present

## 2018-09-29 DIAGNOSIS — R2689 Other abnormalities of gait and mobility: Secondary | ICD-10-CM

## 2018-09-29 NOTE — Therapy (Signed)
[facility] [address] [place], [place], [address] Phone: [phone]   Fax:  [phone]  Physical Therapy Treatment  Patient Details  Name: Kendra Rodriguez MRN: [id number] Date of Birth: 04/07/1966 Referring Provider (PT): James Nitka MD   Encounter Date: 09/29/2018  PT End of Session - 09/29/18 1441    Visit Number  8    Number of Visits  17    Date for PT Re-Evaluation  10/26/18    PT Start Time  1415    PT Stop Time  1458    PT Time Calculation (min)  43 min    Activity Tolerance  Patient tolerated treatment well    Behavior During Therapy  WFL for tasks assessed/performed       Past Medical History:  Diagnosis Date  . Acute sinusitis   . Allergy   . Anxiety   . Depression    NO MEDICATIONS - DOING OK NOW  . Dizziness   . Ear ache    RIGHT --HX OF MULTIPLE EAR INFECTIONS AS A CHILD   . GERD (gastroesophageal reflux disease)   . Headache(784.0)    MIGRAINES  . Leg pain, left    with swelling  . Lumbar stenosis    PAIN BACK AND LEFT LEG AND NUMBNESS LEG AND FOOT  . Peripheral vascular disease (HCC)    HX OF TREATMENT FOR VARICOSE VEINS RIGHT LEG  . Thyroid disease    TOLD BLOOD LEVELS SLIGHTLY ABNORMAL - BUT NOT REQUIRED MEDICATION    Past Surgical History:  Procedure Laterality Date  . ABDOMINAL HYSTERECTOMY    . bladder lift     AT SAME TIME OF HYSTERECTOMY  . CHOLECYSTECTOMY    . LUMBAR LAMINECTOMY/DECOMPRESSION MICRODISCECTOMY Left 10/24/2014   Procedure: COMPLETE LUMBAR DECOMPRESSION L4-L5 CENTRAL AND MICRODISCECTOMY L4-L5 LEFT;  Surgeon: Ronald A Gioffre, MD;  Location: [facility];  Service: Orthopedics;  Laterality: Left;    There were no vitals filed for this visit.  Subjective Assessment - 09/29/18 1411    Subjective  "my foot is still sore, and the lower back is at a 7 and the R foot is 7/10. The right shoulder is about a 4/10 today"     Currently in Pain?  Yes    Pain Score  4     Pain  Location  Shoulder    Pain Orientation  Right    Pain Descriptors / Indicators  Aching    Pain Type  Chronic pain    Pain Onset  More than a month ago    Pain Frequency  Intermittent    Pain Score  7    Pain Location  Ankle    Pain Orientation  Right    Pain Descriptors / Indicators  Aching;Burning    Pain Type  Chronic pain    Pain Frequency  Intermittent    Aggravating Factors   standing/ walking          [facility] PT Assessment - 09/29/18 0001      Assessment   Referring Provider (PT)  James Nitka MD                   [facility] Adult PT Treatment/Exercise - 09/29/18 0001      Self-Care   Self-Care  Posture    Posture  sleeping positiong using a pillow under the arm to calm down referred symptom down the RUE      Exercises   Exercises  Shoulder;Ankle        Lumbar Exercises: Stretches   Passive Hamstring Stretch  2 reps;30 seconds;Right    ITB Stretch  2 reps;30 seconds;Right   in L sidelying   Piriformis Stretch  2 reps;30 seconds      Knee/Hip Exercises: Supine   Hip Adduction Isometric  2 sets;10 reps;Strengthening;Both   holding 5 sec ea.   Other Supine Knee/Hip Exercises  hip flexion with ball squeeze between knees 10 x holding 3 sec ea.      Shoulder Exercises: ROM/Strengthening   Nustep  L5 x 5 min UE/LE      Manual Therapy   Manual Therapy  Muscle Energy Technique    Manual therapy comments  MTPR along piriformis and glute medius   pt reported increased R ankle referred pain    Soft tissue mobilization  IASTM along the R glute med/ piriformis    Muscle Energy Technique  resisted R hip flexion 5 x 10 sec in supine      Ankle Exercises: Stretches   Gastroc Stretch  2 reps;30 seconds               PT Short Term Goals - 09/29/18 1519      PT SHORT TERM GOAL #1   Title  pt to be I with inital HEP    Time  4    Period  Weeks    Status  Achieved      PT SHORT TERM GOAL #2   Title  pt to verbalize techniques to calm down R shoulder and  foot pain via RICE and HEP    Time  4    Period  Weeks    Status  Achieved      PT SHORT TERM GOAL #3   Title  pt to increase R grip strength by >/= 10# to demo improvement in shoulder function    Time  4    Period  Weeks    Status  Unable to assess      PT SHORT TERM GOAL #4   Title  pt to be able to perofrm grooming and dressing with  </= 5/10 pain in the R shoulder    Time  4    Period  Weeks    Status  Achieved        PT Long Term Goals - 08/31/18 1532      PT LONG TERM GOAL #1   Title  increase R shoulder abduction/ flexion to >/= 130 and IR to L1, and ER to C6 to promote functional mobility required for ADLs with </= 2/10 pain     Time  8    Period  Weeks    Status  New    Target Date  10/26/18      PT LONG TERM GOAL #2   Title  increase R shoulder strength to >/= 4/5 to promote functional lifting and carrying for ADLs with </= 2/10 pain     Time  8    Period  Weeks    Status  New    Target Date  09/28/18      PT LONG TERM GOAL #3   Title  increase R ankle strength to >/= 5/5 in all planes to promote stability with walking/ standing activities and maximize gait efficency    Time  8    Period  Weeks    Status  New    Target Date  10/26/18      PT LONG TERM GOAL #4   Title    pt to increase FOTO score </= 35% limited to demo improvement in function     Time  8    Period  Weeks    Status  New    Target Date  10/26/18      PT LONG TERM GOAL #5   Title  pt to be able to stand/ walk >/= 60 min with </= 2/10 pain for functional endurance required for household tasks     Time  8    Period  Weeks    Status  New    Target Date  10/26/18      Additional Long Term Goals   Additional Long Term Goals  --      PT LONG TERM GOAL #6   Title  pt to be i with all HEP given as of last visit to maintain and progress current function independently    Time  8    Period  Weeks    Status  New    Target Date  10/26/18            Plan - 09/29/18 1513    Clinical  Impression Statement  pt reports increased R ankle pain and R low back pain which following further assessment revealed SIJ involvement. following MET of the R LE with resisted HIp flexion and core activation she reported decreased pain in th eback and RLE. focused primarily on LE due to increased pain and symptoms. plan to conitnue with current POC to work on pain in the R ankle/LE and R shoulder.     PT Treatment/Interventions  ADLs/Self Care Home Management;Cryotherapy;Electrical Stimulation;Iontophoresis 4mg/ml Dexamethasone;Moist Heat;Ultrasound;Therapeutic activities;Therapeutic exercise;Manual techniques;Taping;Vasopneumatic Device;Dry needling;Passive range of motion;Patient/family education;Gait training;Balance training    PT Next Visit Plan  update HEP, shoulder ROM, STW, scapular mobs, peri-scapular strengthening.     PT Home Exercise Plan  upper trap stretch, scapular retraction, calf stretch, table slides flexion/ abduction, shoulder IR/ER, rows horizontal abduction, rib mobs     Consulted and Agree with Plan of Care  Patient       Patient will benefit from skilled therapeutic intervention in order to improve the following deficits and impairments:  Pain, Decreased endurance, Decreased strength, Increased fascial restricitons, Difficulty walking, Postural dysfunction, Improper body mechanics, Decreased activity tolerance, Decreased range of motion, Abnormal gait, Increased edema  Visit Diagnosis: Chronic right shoulder pain  Muscle weakness (generalized)  Pain in right ankle and joints of right foot  Other abnormalities of gait and mobility     Problem List Patient Active Problem List   Diagnosis Date Noted  . Right ear pain 04/13/2018  . Vertigo 04/13/2018  . Prediabetes 10/17/2015  . Hypertriglyceridemia 10/17/2015  . Lumbar disc herniation 10/25/2014  . Spinal stenosis, lumbar region, with neurogenic claudication 10/24/2014  . Varicose veins of right leg with edema  01/30/2014   Kristoffer Leamon PT, DPT, LAT, ATC  09/29/18  3:20 PM      [facility] [address] [place], [place], [address] Phone: [phone]   Fax:  [phone]  Name: Kendra Rodriguez MRN: [id number] Date of Birth: 11/28/1965   

## 2018-10-04 IMAGING — MR MR HEAD W/O CM
9 of 10 series · 38 of 48 positions shown · non-contrast
Comparison: CT head without contrast 10/09/2011.

CLINICAL DATA: Vertigo, episodic, peripheral. Non intractable
headache, unspecified chronicity pattern, unspecified headache type.
Diplopia. Nausea without vomiting. Otalgia of the right ear.

EXAM:
MRI HEAD WITHOUT CONTRAST
TECHNIQUE: Multiplanar, multiecho pulse sequences of the brain and surrounding
structures were obtained without intravenous contrast.

[Series 3: DWI · axial · 3.0mm · 0.94mm/px · z∈[-135,+23]mm · 11 of 110 slices shown (1 of 2)]
[im 1/110]
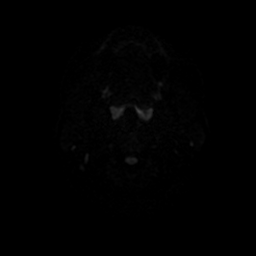
[im 11/110]
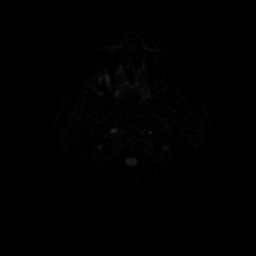
[im 22/110]
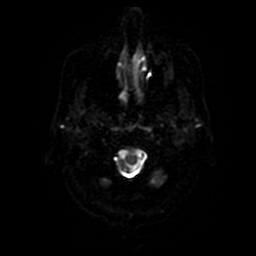
[im 33/110]
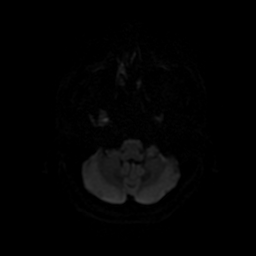
[im 44/110]
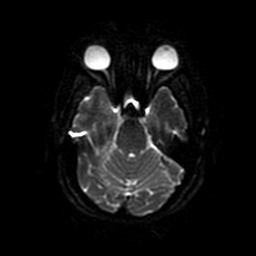
[im 55/110]
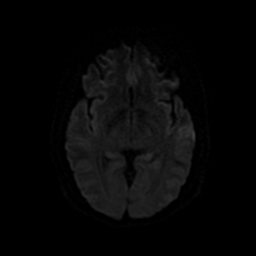
[im 66/110]
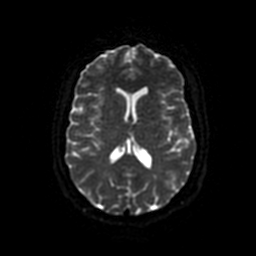
[im 77/110]
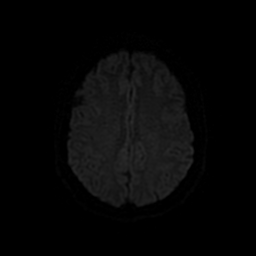
[im 88/110]
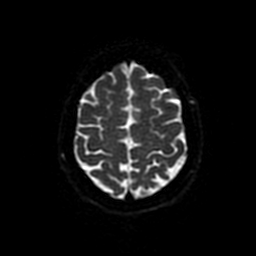
[im 99/110]
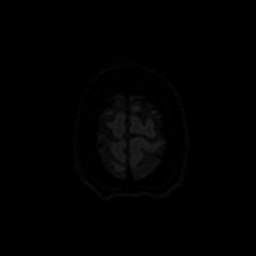
[im 110/110]
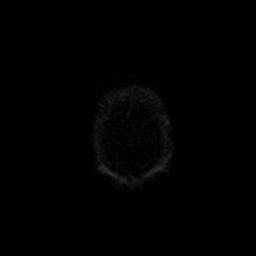

[Series 4: DWI · coronal · 4.0mm · 0.94mm/px · 8 of 72 slices shown (2 of 2)]
[im 1/72]
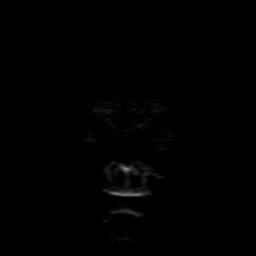
[im 11/72]
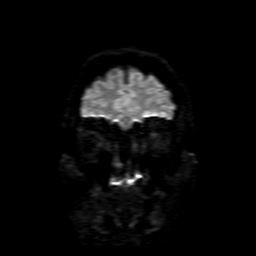
[im 21/72]
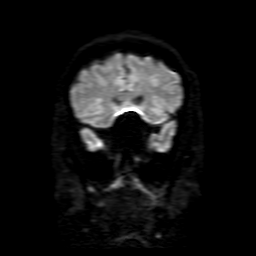
[im 31/72]
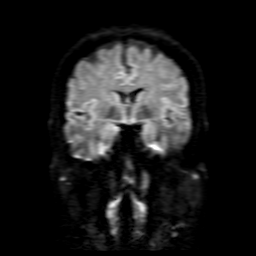
[im 41/72]
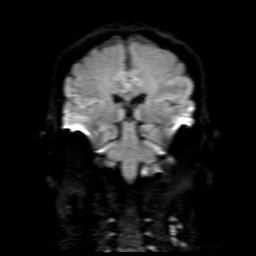
[im 51/72]
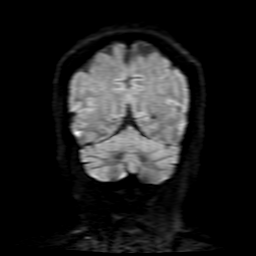
[im 61/72]
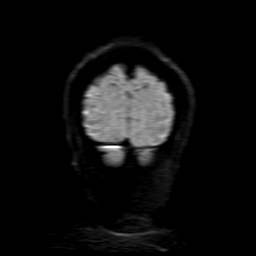
[im 72/72]
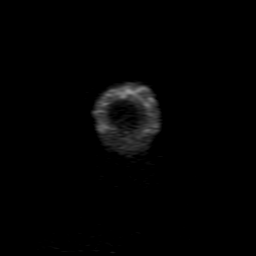

[Series 5: FLAIR · sagittal · 5.0mm · 0.47mm/px · 2 of 23 slices shown (1 of 2)]
[im 1/23]
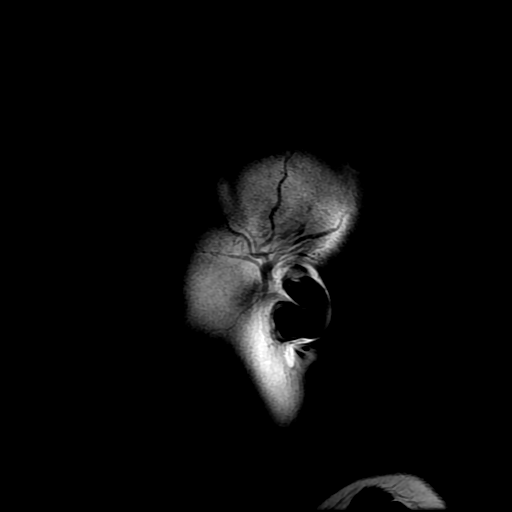
[im 23/23]
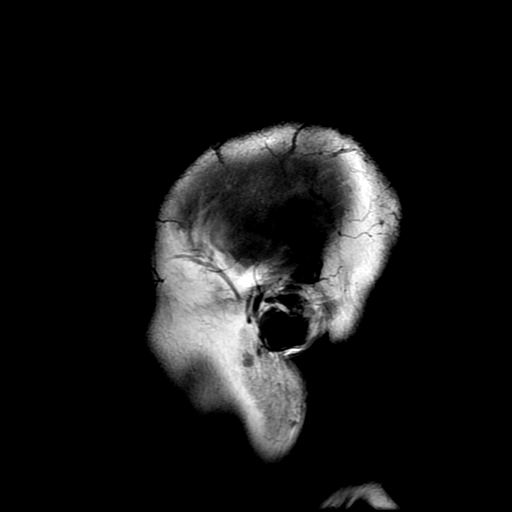

[Series 7: T2 · axial · 5.0mm · 0.47mm/px · z∈[-123,+24]mm · 2 of 26 slices shown (1 of 2)]
[im 1/26]
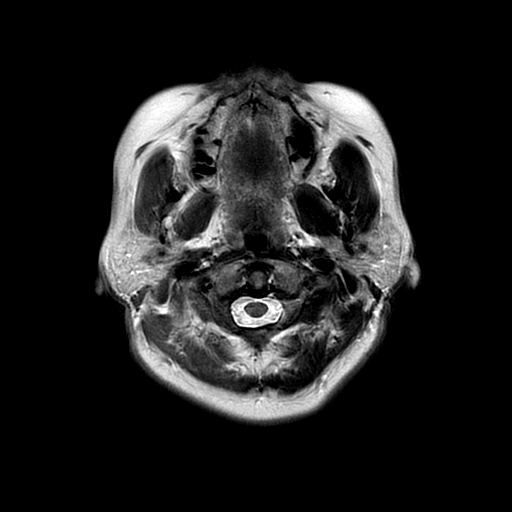
[im 26/26]
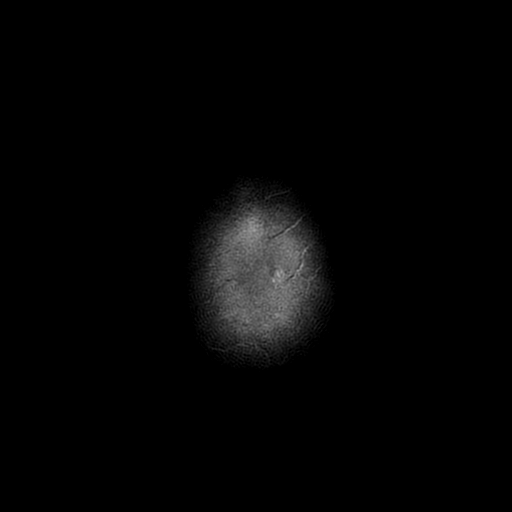

[Series 8: FLAIR · axial · 3.0mm · 0.45mm/px · z∈[-117,+23]mm · 2 of 25 slices shown (2 of 2)]
[im 1/25]
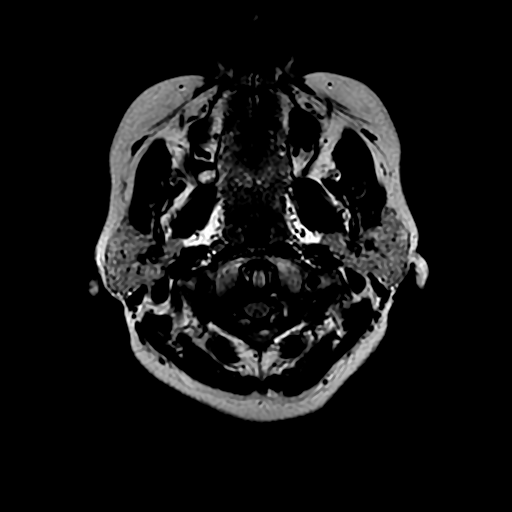
[im 25/25]
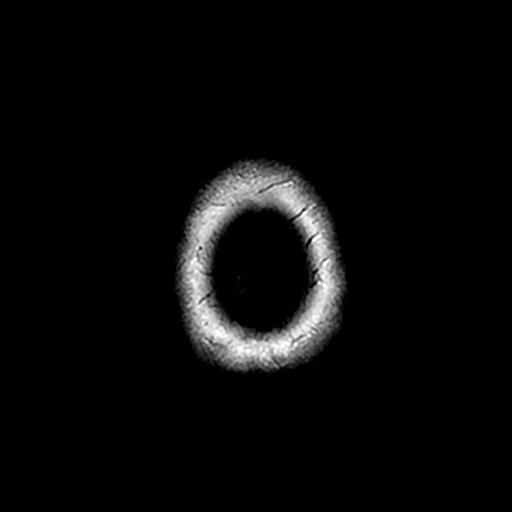

[Series 9: (person_name) · axial · 3.0mm · 0.47mm/px · z∈[-117,-81]mm · 3 of 100 slices shown]
[im 1/100]
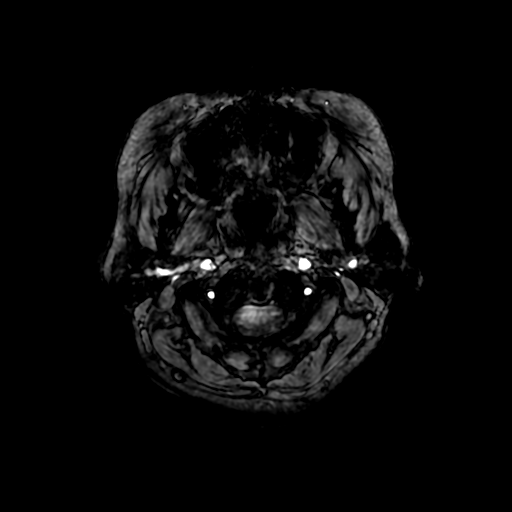
[im 13/100]
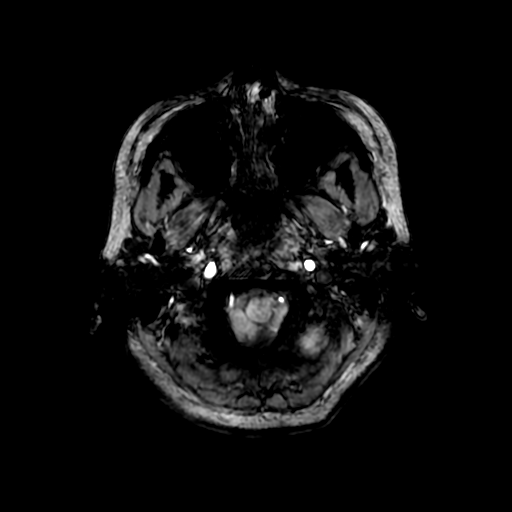
[im 25/100]
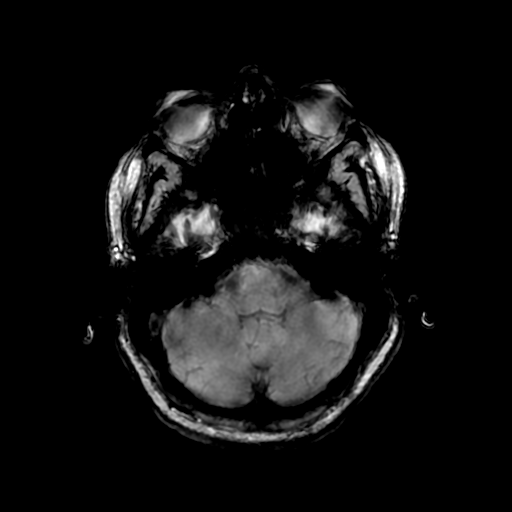

[Series 11: T2 · coronal · 5.0mm · 0.39mm/px · 2 of 25 slices shown (2 of 2)]
[im 1/25]
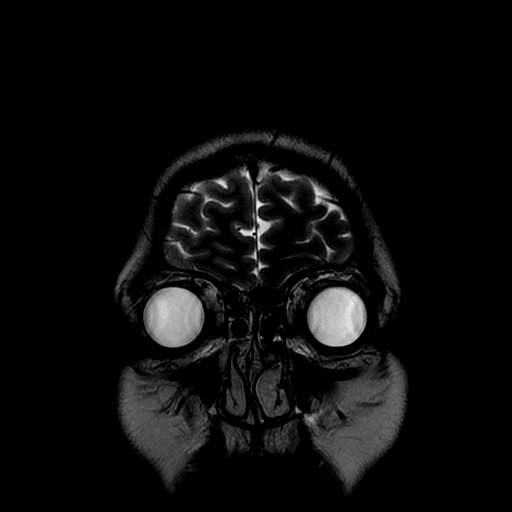
[im 25/25]
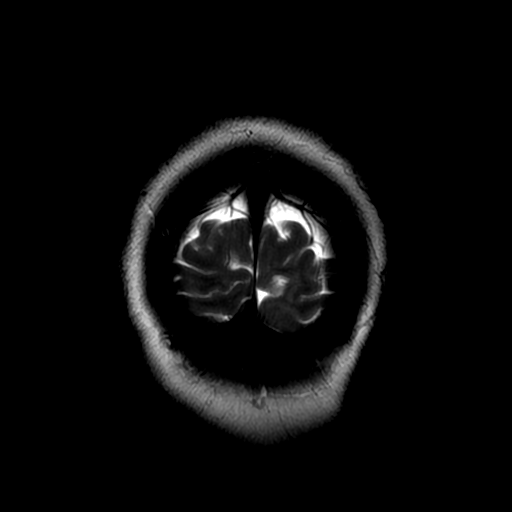

[Series 350: ADC · axial · 3.0mm · 0.94mm/px · z∈[-135,+23]mm · 5 of 55 slices shown (1 of 2)]
[im 1/55]
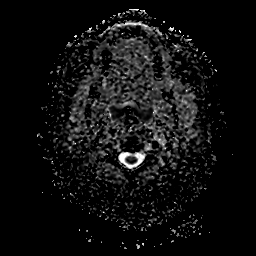
[im 14/55]
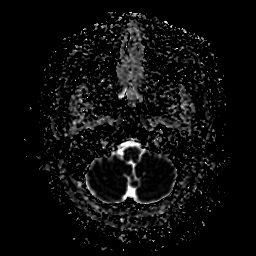
[im 28/55]
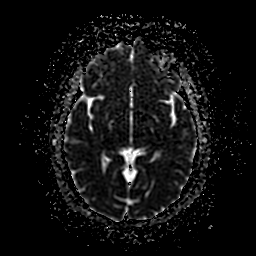
[im 41/55]
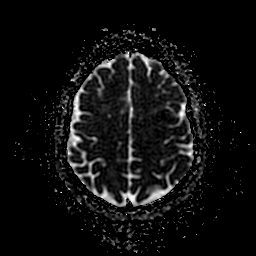
[im 55/55]
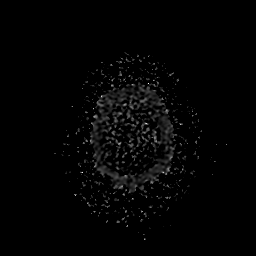

[Series 450: ADC · coronal · 4.0mm · 0.94mm/px · 3 of 36 slices shown (2 of 2)]
[im 1/36]
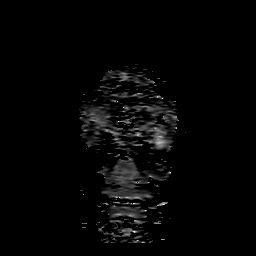
[im 18/36]
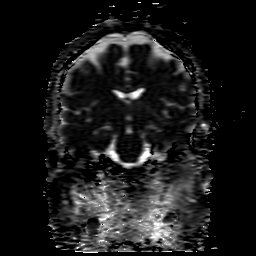
[im 36/36]
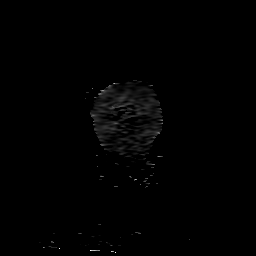

[38 of 48 positions shown; findings below may reference images not displayed]

FINDINGS: Brain: No acute infarct, hemorrhage, or mass lesion is present. The
ventricles are of normal size. No significant extraaxial fluid
collection is present. Four or five scattered subcortical T2
hyperintensities are within normal limits for age. No significant
white matter disease is present. Internal auditory canals are within
normal limits bilaterally. The brainstem and cerebellum are normal.

Vascular: Flow is present in the major intracranial arteries.

Skull and upper cervical spine: The skull base is within normal
limits. Craniocervical junction is normal.

Sinuses/Orbits: Mild mucosal thickening present along the floor of
the left maxillary sinus. The remaining paranasal sinuses and
mastoid air cells are clear. Globes and orbits are within normal
limits.
IMPRESSION: 1. Normal MRI appearance of the brain for age.

## 2018-10-07 ENCOUNTER — Ambulatory Visit (INDEPENDENT_AMBULATORY_CARE_PROVIDER_SITE_OTHER): Payer: BLUE CROSS/BLUE SHIELD | Admitting: Specialist

## 2018-10-07 ENCOUNTER — Encounter (INDEPENDENT_AMBULATORY_CARE_PROVIDER_SITE_OTHER): Payer: Self-pay | Admitting: Specialist

## 2018-10-07 VITALS — BP 127/80 | HR 73 | Ht 62.0 in | Wt 196.0 lb

## 2018-10-07 DIAGNOSIS — M778 Other enthesopathies, not elsewhere classified: Secondary | ICD-10-CM

## 2018-10-07 DIAGNOSIS — G5601 Carpal tunnel syndrome, right upper limb: Secondary | ICD-10-CM | POA: Diagnosis not present

## 2018-10-07 DIAGNOSIS — M722 Plantar fascial fibromatosis: Secondary | ICD-10-CM

## 2018-10-07 DIAGNOSIS — M7581 Other shoulder lesions, right shoulder: Secondary | ICD-10-CM | POA: Diagnosis not present

## 2018-10-07 DIAGNOSIS — M4316 Spondylolisthesis, lumbar region: Secondary | ICD-10-CM

## 2018-10-07 NOTE — Progress Notes (Signed)
Office Visit Note   Patient: Kendra Rodriguez           Date of Birth: 15-Dec-1965           MRN: [id number] Visit Date: 10/07/2018              Requested by: Loletta SpecterGomez, Roger David, PA-C [id number] [place], [place] [id number] PCP: Loletta SpecterGomez, Roger David, PA-C   Assessment & Plan: Visit Diagnoses:  1. Right wrist tendonitis   2. Shoulder tendonitis, right   3. Carpal tunnel syndrome, right upper limb   4. Plantar fasciitis, right     Plan: Avoid overhead lifting and overhead use of the arms. Do not lift greater than 10 lbs. Tylenol ES one every 6-8 hours for pain and inflamation. Call if you are having right knee pain and need to consider a cortisone injection into the right knee. Continue with PT for another 3-4 weeks, then a home exercise program. Avoid bending, stooping and avoid lifting weights greater than 10 lbs. Avoid prolong standing and walking. Avoid frequent bending and stooping  No lifting greater than 10 lbs. May use ice or moist heat for pain. Weight loss is of benefit. Use right wrist splint intermittently for assisting when the wrist is painful. Follow-Up Instructions: Return in about 5 weeks (around 11/11/2018).   Orders:  No orders of the defined types were placed in this encounter.  No orders of the defined types were placed in this encounter.     Procedures: No procedures performed   Clinical Data: No additional findings.   Subjective: Chief Complaint  Patient presents with  . Right Shoulder - Follow-up  . Right Foot - Follow-up    52 year old female with right shoulder pain and carpal tunnel symptoms.  She is going to therapy for the right shoulder and this is improving and she is  Scheduled for further therapy this month for right shoulder tendonitis and bursitis. She is also having problem with right foot heel pain.  Her therapist recommented a store on [address] for arch supports and the use of voltaren gel for arthritis. She also has right  back pain and it is worse with  Bending and stooping and lifting. No numbness or weakness. No pain with prolong standing and walking. Previous studies show spondylolisthesis at L4-5 which is dynamic.    Review of Systems  Constitutional: Negative.   HENT: Negative.   Eyes: Negative.   Respiratory: Negative.   Cardiovascular: Negative.   Gastrointestinal: Negative.   Endocrine: Negative.   Genitourinary: Negative.   Musculoskeletal: Negative.   Skin: Negative.   Allergic/Immunologic: Negative.   Neurological: Negative.   Hematological: Negative.   Psychiatric/Behavioral: Negative.      Objective: Vital Signs: BP 127/80 (BP Location: Left Arm, Patient Position: Sitting)   Pulse 73   Ht 5\' 2"  (1.575 m)   Wt 196 lb (88.9 kg)   BMI 35.85 kg/m   Physical Exam  Constitutional: She is oriented to person, place, and time. She appears well-developed and well-nourished.  HENT:  Head: Normocephalic and atraumatic.  Eyes: Pupils are equal, round, and reactive to light. EOM are normal.  Neck: Normal range of motion. Neck supple.  Pulmonary/Chest: Effort normal and breath sounds normal.  Abdominal: Soft. Bowel sounds are normal.  Musculoskeletal: Normal range of motion.  Neurological: She is alert and oriented to person, place, and time.  Skin: Skin is warm and dry.  Psychiatric: She has a normal mood and affect.  Her behavior is normal. Judgment and thought content normal.    Ortho Exam  Specialty Comments:  No specialty comments available.  Imaging: No results found.   PMFS History: Patient Active Problem List   Diagnosis Date Noted  . Right ear pain 04/13/2018  . Vertigo 04/13/2018  . Prediabetes 10/17/2015  . Hypertriglyceridemia 10/17/2015  . Lumbar disc herniation 10/25/2014  . Spinal stenosis, lumbar region, with neurogenic claudication 10/24/2014  . Varicose veins of right leg with edema 01/30/2014   Past Medical History:  Diagnosis Date  . Acute sinusitis     . Allergy   . Anxiety   . Depression    NO MEDICATIONS - DOING OK NOW  . Dizziness   . Ear ache    RIGHT --HX OF MULTIPLE EAR INFECTIONS AS A CHILD   . GERD (gastroesophageal reflux disease)   . Headache(784.0)    MIGRAINES  . Leg pain, left    with swelling  . Lumbar stenosis    PAIN BACK AND LEFT LEG AND NUMBNESS LEG AND FOOT  . Peripheral vascular disease (HCC)    HX OF TREATMENT FOR VARICOSE VEINS RIGHT LEG  . Thyroid disease    TOLD BLOOD LEVELS SLIGHTLY ABNORMAL - BUT NOT REQUIRED MEDICATION    Family History  Problem Relation Age of Onset  . Hypertension Mother   . Arthritis Mother   . Diabetes Mother     Past Surgical History:  Procedure Laterality Date  . ABDOMINAL HYSTERECTOMY    . bladder lift     AT SAME TIME OF HYSTERECTOMY  . CHOLECYSTECTOMY    . LUMBAR LAMINECTOMY/DECOMPRESSION MICRODISCECTOMY Left 10/24/2014   Procedure: COMPLETE LUMBAR DECOMPRESSION L4-L5 CENTRAL AND MICRODISCECTOMY L4-L5 LEFT;  Surgeon: Jacki Cones, MD;  Location: [facility];  Service: Orthopedics;  Laterality: Left;   Social History   Occupational History  . Not on file  Tobacco Use  . Smoking status: Never Smoker  . Smokeless tobacco: Never Used  Substance and Sexual Activity  . Alcohol use: No  . Drug use: No  . Sexual activity: Yes

## 2018-10-07 NOTE — Patient Instructions (Addendum)
Avoid overhead lifting and overhead use of the arms. Do not lift greater than 10 lbs. Tylenol ES one every 6-8 hours for pain and inflamation. Call if you are having right knee pain and need to consider a cortisone injection into the right knee. Continue with PT for another 3-4 weeks, then a home exercise program. Avoid bending, stooping and avoid lifting weights greater than 10 lbs. Avoid prolong standing and walking. Avoid frequent bending and stooping  No lifting greater than 10 lbs. May use ice or moist heat for pain. Weight loss is of benefit. Use right wrist splint intermittently for assisting when the wrist is painful.

## 2018-10-11 ENCOUNTER — Encounter: Payer: Self-pay | Admitting: Physical Therapy

## 2018-10-11 ENCOUNTER — Ambulatory Visit: Payer: BLUE CROSS/BLUE SHIELD | Attending: Specialist | Admitting: Physical Therapy

## 2018-10-11 DIAGNOSIS — R2689 Other abnormalities of gait and mobility: Secondary | ICD-10-CM | POA: Diagnosis not present

## 2018-10-11 DIAGNOSIS — M25511 Pain in right shoulder: Secondary | ICD-10-CM | POA: Diagnosis not present

## 2018-10-11 DIAGNOSIS — G8929 Other chronic pain: Secondary | ICD-10-CM | POA: Insufficient documentation

## 2018-10-11 DIAGNOSIS — M6281 Muscle weakness (generalized): Secondary | ICD-10-CM | POA: Diagnosis not present

## 2018-10-11 DIAGNOSIS — M5442 Lumbago with sciatica, left side: Secondary | ICD-10-CM | POA: Insufficient documentation

## 2018-10-11 DIAGNOSIS — M25571 Pain in right ankle and joints of right foot: Secondary | ICD-10-CM | POA: Diagnosis not present

## 2018-10-11 NOTE — Therapy (Signed)
[facility] [place], [place], [address] Phone: [phone]   Fax:  [phone]  Physical Therapy Treatment / Re-certification  Patient Details  Name: Kendra Rodriguez MRN: [id number] Date of Birth: 31-Jan-1966 Referring Provider (PT): Basil Dess MD   Encounter Date: 10/11/2018  PT End of Session - 10/11/18 1238    Visit Number  9    Number of Visits  21    Date for PT Re-Evaluation  11/22/18    PT Start Time  2023    PT Stop Time  1231    PT Time Calculation (min)  43 min    Activity Tolerance  Patient tolerated treatment well    Behavior During Therapy  [facility] for tasks assessed/performed       Past Medical History:  Diagnosis Date  . Acute sinusitis   . Allergy   . Anxiety   . Depression    NO MEDICATIONS - DOING OK NOW  . Dizziness   . Ear ache    RIGHT --HX OF MULTIPLE EAR INFECTIONS AS A CHILD   . GERD (gastroesophageal reflux disease)   . Headache(784.0)    MIGRAINES  . Leg pain, left    with swelling  . Lumbar stenosis    PAIN BACK AND LEFT LEG AND NUMBNESS LEG AND FOOT  . Peripheral vascular disease (HCC)    HX OF TREATMENT FOR VARICOSE VEINS RIGHT LEG  . Thyroid disease    TOLD BLOOD LEVELS SLIGHTLY ABNORMAL - BUT NOT REQUIRED MEDICATION    Past Surgical History:  Procedure Laterality Date  . ABDOMINAL HYSTERECTOMY    . bladder lift     AT SAME TIME OF HYSTERECTOMY  . CHOLECYSTECTOMY    . LUMBAR LAMINECTOMY/DECOMPRESSION MICRODISCECTOMY Left 10/24/2014   Procedure: COMPLETE LUMBAR DECOMPRESSION L4-L5 CENTRAL AND MICRODISCECTOMY L4-L5 LEFT;  Surgeon: Tobi Bastos, MD;  Location: [facility];  Service: Orthopedics;  Laterality: Left;    There were no vitals filed for this visit.  Subjective Assessment - 10/11/18 1148    Subjective  pt brought in an updated referral for the low back. " I only have alittle pain in the shoulder and the low back. The back has been going on for a while but for the  last 2 weeks it seems to have gotten worse    Currently in Pain?  Yes    Pain Score  3     Pain Location  Shoulder    Pain Orientation  Right    Aggravating Factors   reaching overhead     Pain Relieving Factors  laying down, exercise     Pain Score  4    Pain Location  Ankle    Pain Orientation  Right    Pain Descriptors / Indicators  Sore    Pain Type  Chronic pain    Pain Onset  More than a month ago    Pain Frequency  Intermittent    Aggravating Factors   stanidng/ walking    Pain Score  6    Pain Location  Back    Pain Orientation  Right    Pain Type  Chronic pain    Pain Onset  More than a month ago    Pain Frequency  Intermittent    Aggravating Factors   sitting for long periods of time,     Pain Relieving Factors  unsure         [facility] PT Assessment - 10/11/18 1154  AROM   AROM Assessment Site  Lumbar    Lumbar Flexion  58    Lumbar Extension  10   pain during movement   Lumbar - Right Side Bend  8   increased pain during motion   Lumbar - Left Side Bend  10      Special Tests    Special Tests  Lumbar;Sacrolliac Tests    Sacroiliac Tests   Gaenslen's Test                   [facility] Adult PT Treatment/Exercise - 10/11/18 0001      Exercises   Exercises  Lumbar      Lumbar Exercises: Stretches   Active Hamstring Stretch  4 reps;30 seconds   PNF contract/ relax with 10 sec hold   Other Lumbar Stretch Exercise  sitting low back stretch 2 x 30 sec      Lumbar Exercises: Supine   Dead Bug  10 reps   10 sec hold   Straight Leg Raise  10 reps   RLE only     Manual Therapy   Joint Mobilization  LAD grade 5 RLE    Muscle Energy Technique  resisted R hip flexion 5 x 10 sec in supine               PT Short Term Goals - 09/29/18 1519      PT SHORT TERM GOAL #1   Title  pt to be I with inital HEP    Time  4    Period  Weeks    Status  Achieved      PT SHORT TERM GOAL #2   Title  pt to verbalize techniques to calm down R shoulder  and foot pain via RICE and HEP    Time  4    Period  Weeks    Status  Achieved      PT SHORT TERM GOAL #3   Title  pt to increase R grip strength by >/= 10# to demo improvement in shoulder function    Time  4    Period  Weeks    Status  Unable to assess      PT SHORT TERM GOAL #4   Title  pt to be able to perofrm grooming and dressing with  </= 5/10 pain in the R shoulder    Time  4    Period  Weeks    Status  Achieved        PT Long Term Goals - 10/11/18 1241      PT LONG TERM GOAL #1   Title  increase R shoulder abduction/ flexion to >/= 130 and IR to L1, and ER to C6 to promote functional mobility required for ADLs with </= 2/10 pain     Time  8    Period  Weeks    Status  Partially Met      PT LONG TERM GOAL #2   Title  increase R shoulder strength to >/= 4/5 to promote functional lifting and carrying for ADLs with </= 2/10 pain     Time  8    Period  Weeks    Status  Partially Met    Target Date  11/22/18      PT LONG TERM GOAL #3   Title  increase R ankle strength to >/= 5/5 in all planes to promote stability with walking/ standing activities and maximize gait efficency    Time  8  Period  Weeks    Status  Unable to assess    Target Date  11/22/18      PT LONG TERM GOAL #4   Title  pt to increase FOTO score </= 35% limited to demo improvement in function     Time  8    Period  Weeks    Status  Unable to assess    Target Date  11/22/18      PT LONG TERM GOAL #5   Title  pt to be able to stand/ walk >/= 60 min with </= 2/10 pain for functional endurance required for household tasks     Time  8    Period  Weeks    Status  On-going    Target Date  11/22/18      Additional Long Term Goals   Additional Long Term Goals  Yes      PT LONG TERM GOAL #6   Title  pt to be i with all HEP given as of last visit to maintain and progress current function independently    Time  8    Period  Weeks    Status  On-going    Target Date  11/22/18      PT LONG TERM  GOAL #7   Title  increase low back mobility to [facility] to with </= 2/10 pain for functional mobility required for ADLs    Time  6    Period  Weeks    Status  New    Target Date  11/22/18            Plan - 10/11/18 1238    Clinical Impression Statement  pt had an updated referral to include her low back. She reported hx or chronic pain with recent exacerbation 2 weeks ago. special testing is consistent for SIJ involvement in combination with low back spasm. following hip mobs and MET for R hip flexors she reported pain dropped from 7/10 to 2-3/10 and additonally reported decreased soreness in the hip. She would benefit from continued physical therapy treating R shoulder/ ankle and low back to reduce pain, improve trunk mobility, and maximize overall function     PT Frequency  2x / week    PT Duration  6 weeks    PT Treatment/Interventions  ADLs/Self Care Home Management;Cryotherapy;Electrical Stimulation;Iontophoresis 37m/ml Dexamethasone;Moist Heat;Ultrasound;Therapeutic activities;Therapeutic exercise;Manual techniques;Taping;Vasopneumatic Device;Dry needling;Passive range of motion;Patient/family education;Gait training;Balance training    PT Next Visit Plan  update HEP, shoulder ROM, STW, scapular mobs, peri-scapular strengthening.     PT Home Exercise Plan  upper trap stretch, scapular retraction, calf stretch, table slides flexion/ abduction, shoulder IR/ER, rows horizontal abduction, rib mobs        Patient will benefit from skilled therapeutic intervention in order to improve the following deficits and impairments:  Pain, Decreased endurance, Decreased strength, Increased fascial restricitons, Difficulty walking, Postural dysfunction, Improper body mechanics, Decreased activity tolerance, Decreased range of motion, Abnormal gait, Increased edema  Visit Diagnosis: Chronic right shoulder pain - Plan: PT plan of care cert/re-cert  Muscle weakness (generalized) - Plan: PT plan of care  cert/re-cert  Pain in right ankle and joints of right foot - Plan: PT plan of care cert/re-cert  Other abnormalities of gait and mobility - Plan: PT plan of care cert/re-cert  Chronic bilateral low back pain with left-sided sciatica - Plan: PT plan of care cert/re-cert     Problem List Patient Active Problem List   Diagnosis Date Noted  . Right  ear pain 04/13/2018  . Vertigo 04/13/2018  . Prediabetes 10/17/2015  . Hypertriglyceridemia 10/17/2015  . Lumbar disc herniation 10/25/2014  . Spinal stenosis, lumbar region, with neurogenic claudication 10/24/2014  . Varicose veins of right leg with edema 01/30/2014   Starr Lake PT, DPT, LAT, ATC  10/11/18  12:47 PM       [facility] [address] [place], [place], [address] Phone: [phone]   Fax:  [phone]  Name: Kendra Rodriguez MRN: [id number] Date of Birth: 1965-11-16

## 2018-10-13 ENCOUNTER — Ambulatory Visit: Payer: BLUE CROSS/BLUE SHIELD | Admitting: Physical Therapy

## 2018-10-13 ENCOUNTER — Encounter: Payer: Self-pay | Admitting: Physical Therapy

## 2018-10-13 DIAGNOSIS — M25511 Pain in right shoulder: Secondary | ICD-10-CM | POA: Diagnosis not present

## 2018-10-13 DIAGNOSIS — M5442 Lumbago with sciatica, left side: Secondary | ICD-10-CM

## 2018-10-13 DIAGNOSIS — M6281 Muscle weakness (generalized): Secondary | ICD-10-CM

## 2018-10-13 DIAGNOSIS — R2689 Other abnormalities of gait and mobility: Secondary | ICD-10-CM | POA: Diagnosis not present

## 2018-10-13 DIAGNOSIS — M25571 Pain in right ankle and joints of right foot: Secondary | ICD-10-CM

## 2018-10-13 DIAGNOSIS — G8929 Other chronic pain: Secondary | ICD-10-CM | POA: Diagnosis not present

## 2018-10-13 NOTE — Therapy (Signed)
[place], [place], [address] Phone: [phone]   Fax:  [phone]  Physical Therapy Treatment  Patient Details  Name: Kendra Rodriguez MRN: [id number] Date of Birth: April 11, 1966 Referring Provider (PT): Basil Dess MD   Encounter Date: 10/13/2018  PT End of Session - 10/13/18 1326    Visit Number  10    Number of Visits  21    Date for PT Re-Evaluation  11/22/18    PT Start Time  1326    PT Stop Time  1410    PT Time Calculation (min)  44 min    Activity Tolerance  Patient tolerated treatment well    Behavior During Therapy  [facility] for tasks assessed/performed       Past Medical History:  Diagnosis Date  . Acute sinusitis   . Allergy   . Anxiety   . Depression    NO MEDICATIONS - DOING OK NOW  . Dizziness   . Ear ache    RIGHT --HX OF MULTIPLE EAR INFECTIONS AS A CHILD   . GERD (gastroesophageal reflux disease)   . Headache(784.0)    MIGRAINES  . Leg pain, left    with swelling  . Lumbar stenosis    PAIN BACK AND LEFT LEG AND NUMBNESS LEG AND FOOT  . Peripheral vascular disease (HCC)    HX OF TREATMENT FOR VARICOSE VEINS RIGHT LEG  . Thyroid disease    TOLD BLOOD LEVELS SLIGHTLY ABNORMAL - BUT NOT REQUIRED MEDICATION    Past Surgical History:  Procedure Laterality Date  . ABDOMINAL HYSTERECTOMY    . bladder lift     AT SAME TIME OF HYSTERECTOMY  . CHOLECYSTECTOMY    . LUMBAR LAMINECTOMY/DECOMPRESSION MICRODISCECTOMY Left 10/24/2014   Procedure: COMPLETE LUMBAR DECOMPRESSION L4-L5 CENTRAL AND MICRODISCECTOMY L4-L5 LEFT;  Surgeon: Tobi Bastos, MD;  Location: [facility];  Service: Orthopedics;  Laterality: Left;    There were no vitals filed for this visit.  Subjective Assessment - 10/13/18 1328    Subjective  I've been more sore in my core my back is much better at a 3/10    Currently in Pain?  Yes    Pain Score  3     Pain Score  3    Pain Location  Ankle    Pain Orientation   Right    Pain Score  3    Pain Location  Back    Pain Orientation  Right    Pain Type  Chronic pain                       [facility] Adult PT Treatment/Exercise - 10/13/18 0001      Exercises   Exercises  Lumbar;Shoulder      Lumbar Exercises: Seated   Hip Flexion on Ball  Both;Strengthening;15 reps   x 2 sets   Hip Flexion on Ball Limitations  tactile cues to maintain good posture keeping core tight throughout exercise      Lumbar Exercises: Supine   Bridge  15 reps   x 2 sets     Shoulder Exercises: Seated   Other Seated Exercises  bicep curl 2 x 10 with eccentric loading using red theraband      Manual Therapy   Manual therapy comments  skilled palpation and monitoring of pt during TPDN    Joint Mobilization  LAD grade 5 RLE    Soft tissue mobilization  IASTM along the R  bicep    Muscle Energy Technique  resisted R hip flexion 5 x 10 sec in supine      Ankle Exercises: Seated   Other Seated Ankle Exercises  ankle eversion 2 x 10 with red theraband, and posterior tib strengthening       Trigger Point Dry Needling - 10/13/18 1411    Consent Given?  Yes    Education Handout Provided  No    Muscles Treated Upper Body  --   bicep brachii on the R            PT Short Term Goals - 09/29/18 1519      PT SHORT TERM GOAL #1   Title  pt to be I with inital HEP    Time  4    Period  Weeks    Status  Achieved      PT SHORT TERM GOAL #2   Title  pt to verbalize techniques to calm down R shoulder and foot pain via RICE and HEP    Time  4    Period  Weeks    Status  Achieved      PT SHORT TERM GOAL #3   Title  pt to increase R grip strength by >/= 10# to demo improvement in shoulder function    Time  4    Period  Weeks    Status  Unable to assess      PT SHORT TERM GOAL #4   Title  pt to be able to perofrm grooming and dressing with  </= 5/10 pain in the R shoulder    Time  4    Period  Weeks    Status  Achieved        PT Long Term Goals -  10/11/18 1241      PT LONG TERM GOAL #1   Title  increase R shoulder abduction/ flexion to >/= 130 and IR to L1, and ER to C6 to promote functional mobility required for ADLs with </= 2/10 pain     Time  8    Period  Weeks    Status  Partially Met      PT LONG TERM GOAL #2   Title  increase R shoulder strength to >/= 4/5 to promote functional lifting and carrying for ADLs with </= 2/10 pain     Time  8    Period  Weeks    Status  Partially Met    Target Date  11/22/18      PT LONG TERM GOAL #3   Title  increase R ankle strength to >/= 5/5 in all planes to promote stability with walking/ standing activities and maximize gait efficency    Time  8    Period  Weeks    Status  Unable to assess    Target Date  11/22/18      PT LONG TERM GOAL #4   Title  pt to increase FOTO score </= 35% limited to demo improvement in function     Time  8    Period  Weeks    Status  Unable to assess    Target Date  11/22/18      PT LONG TERM GOAL #5   Title  pt to be able to stand/ walk >/= 60 min with </= 2/10 pain for functional endurance required for household tasks     Time  8    Period  Weeks    Status  On-going  Target Date  11/22/18      Additional Long Term Goals   Additional Long Term Goals  Yes      PT LONG TERM GOAL #6   Title  pt to be i with all HEP given as of last visit to maintain and progress current function independently    Time  8    Period  Weeks    Status  On-going    Target Date  11/22/18      PT LONG TERM GOAL #7   Title  increase low back mobility to [facility] to with </= 2/10 pain for functional mobility required for ADLs    Time  6    Period  Weeks    Status  New    Target Date  11/22/18            Plan - 10/13/18 1423    Clinical Impression Statement  pt reports she is doing better but notes sorenss in the core and thights which is likely from her new HEP. continued core strengthenign using physioball, and TPDN focusing on the R proximal bicep which she  reported no pain following. she was able to perofrm all exercisese today reporteding pain dropped in the R shoulder, ankle and low back.     PT Treatment/Interventions  ADLs/Self Care Home Management;Cryotherapy;Electrical Stimulation;Iontophoresis 53m/ml Dexamethasone;Moist Heat;Ultrasound;Therapeutic activities;Therapeutic exercise;Manual techniques;Taping;Vasopneumatic Device;Dry needling;Passive range of motion;Patient/family education;Gait training;Balance training    PT Next Visit Plan  update HEP, shoulder ROM, STW, scapular mobs, peri-scapular strengthening.     PT Home Exercise Plan  upper trap stretch, scapular retraction, calf stretch, table slides flexion/ abduction, shoulder IR/ER, rows horizontal abduction, rib mobs     Consulted and Agree with Plan of Care  Patient       Patient will benefit from skilled therapeutic intervention in order to improve the following deficits and impairments:  Pain, Decreased endurance, Decreased strength, Increased fascial restricitons, Difficulty walking, Postural dysfunction, Improper body mechanics, Decreased activity tolerance, Decreased range of motion, Abnormal gait, Increased edema  Visit Diagnosis: Chronic right shoulder pain  Muscle weakness (generalized)  Pain in right ankle and joints of right foot  Other abnormalities of gait and mobility  Chronic bilateral low back pain with left-sided sciatica     Problem List Patient Active Problem List   Diagnosis Date Noted  . Right ear pain 04/13/2018  . Vertigo 04/13/2018  . Prediabetes 10/17/2015  . Hypertriglyceridemia 10/17/2015  . Lumbar disc herniation 10/25/2014  . Spinal stenosis, lumbar region, with neurogenic claudication 10/24/2014  . Varicose veins of right leg with edema 01/30/2014   KStarr LakePT, DPT, LAT, ATC  10/13/18  2:30 PM      [place] [address]Phone: [phone]   Fax:  [phone] Name: ETALEIGH GEROMRN: [id number] of Birth: 912/20/1967

## 2018-10-15 ENCOUNTER — Encounter

## 2018-10-19 ENCOUNTER — Encounter: Payer: Self-pay | Admitting: Physical Therapy

## 2018-10-19 ENCOUNTER — Ambulatory Visit: Payer: BLUE CROSS/BLUE SHIELD | Admitting: Physical Therapy

## 2018-10-19 DIAGNOSIS — G8929 Other chronic pain: Secondary | ICD-10-CM

## 2018-10-19 DIAGNOSIS — M5442 Lumbago with sciatica, left side: Secondary | ICD-10-CM | POA: Diagnosis not present

## 2018-10-19 DIAGNOSIS — M25571 Pain in right ankle and joints of right foot: Secondary | ICD-10-CM

## 2018-10-19 DIAGNOSIS — M25511 Pain in right shoulder: Principal | ICD-10-CM

## 2018-10-19 DIAGNOSIS — M6281 Muscle weakness (generalized): Secondary | ICD-10-CM

## 2018-10-19 DIAGNOSIS — R2689 Other abnormalities of gait and mobility: Secondary | ICD-10-CM

## 2018-10-19 NOTE — Therapy (Signed)
[place], [place], [address] Phone: [phone]   Fax:  [phone]  Physical Therapy Treatment  Patient Details  Name: ZEANNA SUNDE MRN: [id number] Date of Birth: [birth year] Referring Provider (PT): Basil Dess MD   Encounter Date: 10/19/2018  PT End of Session - 10/19/18 1457    Visit Number  11    Number of Visits  21    Date for PT Re-Evaluation  11/22/18    PT Start Time  1418    PT Stop Time  1457    PT Time Calculation (min)  39 min    Activity Tolerance  Patient tolerated treatment well    Behavior During Therapy  [facility] for tasks assessed/performed       Past Medical History:  Diagnosis Date  . Acute sinusitis   . Allergy   . Anxiety   . Depression    NO MEDICATIONS - DOING OK NOW  . Dizziness   . Ear ache    RIGHT --HX OF MULTIPLE EAR INFECTIONS AS A CHILD   . GERD (gastroesophageal reflux disease)   . Headache(784.0)    MIGRAINES  . Leg pain, left    with swelling  . Lumbar stenosis    PAIN BACK AND LEFT LEG AND NUMBNESS LEG AND FOOT  . Peripheral vascular disease (HCC)    HX OF TREATMENT FOR VARICOSE VEINS RIGHT LEG  . Thyroid disease    TOLD BLOOD LEVELS SLIGHTLY ABNORMAL - BUT NOT REQUIRED MEDICATION    Past Surgical History:  Procedure Laterality Date  . ABDOMINAL HYSTERECTOMY    . bladder lift     AT SAME TIME OF HYSTERECTOMY  . CHOLECYSTECTOMY    . LUMBAR LAMINECTOMY/DECOMPRESSION MICRODISCECTOMY Left 10/24/2014   Procedure: COMPLETE LUMBAR DECOMPRESSION L4-L5 CENTRAL AND MICRODISCECTOMY L4-L5 LEFT;  Surgeon: Tobi Bastos, MD;  Location: [facility];  Service: Orthopedics;  Laterality: Left;    There were no vitals filed for this visit.  Subjective Assessment - 10/19/18 1421    Subjective  "I twisted my R foot on Friday and I am pain on the inside of my ankle due to rolling'    Currently in Pain?  Yes    Pain Score  3     Pain Location  Shoulder    Pain  Orientation  Right    Pain Descriptors / Indicators  Aching    Pain Type  Chronic pain    Pain Onset  More than a month ago    Aggravating Factors   reaching overhead    Pain Relieving Factors  laying down, exerc    Pain Score  6    Pain Orientation  Right    Pain Descriptors / Indicators  Aching;Sore    Pain Onset  More than a month ago    Pain Frequency  Intermittent    Aggravating Factors   standing/ walking    Pain Score  2    Pain Location  Back    Pain Orientation  Right    Pain Descriptors / Indicators  Aching    Pain Type  Chronic pain    Pain Onset  More than a month ago    Pain Frequency  Intermittent    Aggravating Factors   sitting for toolong                        [facility] Adult PT Treatment/Exercise - 10/19/18 0001  Elbow Exercises   Elbow Flexion  Right;10 reps;Strengthening   3#     Shoulder Exercises: Supine   Protraction  20 reps;Weights   with cirlce s   Protraction Weight (lbs)  3    Other Supine Exercises  lower trap strengthening with blue band in supine 3 x 10      Shoulder Exercises: Seated   Other Seated Exercises  2 x 15 with elbows propped on bolster with green theraband      Shoulder Exercises: Sidelying   Other Sidelying Exercises  shoulder abduction in L sidelying 2 x 10   combined with scapular upward rotation assist     Manual Therapy   Scapular Mobilization  upward rotation performed with active sidelying L shoulder abduction      Ankle Exercises: Seated   ABC's  2 reps    Other Seated Ankle Exercises  ankle 4 way strengthening with red theraband 1 x 12             PT Education - 10/19/18 1457    Education Details  updated HEP for 4 way ankle strengthening with red theraband    Person(s) Educated  Patient    Methods  Explanation;Verbal cues    Comprehension  Verbalized understanding;Verbal cues required       PT Short Term Goals - 09/29/18 1519      PT SHORT TERM GOAL #1   Title  pt to be I with  inital HEP    Time  4    Period  Weeks    Status  Achieved      PT SHORT TERM GOAL #2   Title  pt to verbalize techniques to calm down R shoulder and foot pain via RICE and HEP    Time  4    Period  Weeks    Status  Achieved      PT SHORT TERM GOAL #3   Title  pt to increase R grip strength by >/= 10# to demo improvement in shoulder function    Time  4    Period  Weeks    Status  Unable to assess      PT SHORT TERM GOAL #4   Title  pt to be able to perofrm grooming and dressing with  </= 5/10 pain in the R shoulder    Time  4    Period  Weeks    Status  Achieved        PT Long Term Goals - 10/11/18 1241      PT LONG TERM GOAL #1   Title  increase R shoulder abduction/ flexion to >/= 130 and IR to L1, and ER to C6 to promote functional mobility required for ADLs with </= 2/10 pain     Time  8    Period  Weeks    Status  Partially Met      PT LONG TERM GOAL #2   Title  increase R shoulder strength to >/= 4/5 to promote functional lifting and carrying for ADLs with </= 2/10 pain     Time  8    Period  Weeks    Status  Partially Met    Target Date  11/22/18      PT LONG TERM GOAL #3   Title  increase R ankle strength to >/= 5/5 in all planes to promote stability with walking/ standing activities and maximize gait efficency    Time  8    Period  Weeks  Status  Unable to assess    Target Date  11/22/18      PT LONG TERM GOAL #4   Title  pt to increase FOTO score </= 35% limited to demo improvement in function     Time  8    Period  Weeks    Status  Unable to assess    Target Date  11/22/18      PT LONG TERM GOAL #5   Title  pt to be able to stand/ walk >/= 60 min with </= 2/10 pain for functional endurance required for household tasks     Time  8    Period  Weeks    Status  On-going    Target Date  11/22/18      Additional Long Term Goals   Additional Long Term Goals  Yes      PT LONG TERM GOAL #6   Title  pt to be i with all HEP given as of last visit to  maintain and progress current function independently    Time  8    Period  Weeks    Status  On-going    Target Date  11/22/18      PT LONG TERM GOAL #7   Title  increase low back mobility to [facility] to with </= 2/10 pain for functional mobility required for ADLs    Time  6    Period  Weeks    Status  New    Target Date  11/22/18            Plan - 10/19/18 1458    Clinical Impression Statement  pt states she rolled her ankle last friday reporting pain at 6-7/10, but notes improvement in the shoulder and back. continued working on core strength and shoulder stabilizers which fatiqued quickly but noted improvement of pain. updated HEp for ankle strengthening which she noted improvement at end of session.     PT Treatment/Interventions  ADLs/Self Care Home Management;Cryotherapy;Electrical Stimulation;Iontophoresis 46m/ml Dexamethasone;Moist Heat;Ultrasound;Therapeutic activities;Therapeutic exercise;Manual techniques;Taping;Vasopneumatic Device;Dry needling;Passive range of motion;Patient/family education;Gait training;Balance training    PT Next Visit Plan  update HEP, shoulder ROM, STW, scapular mobs, peri-scapular strengthening.     PT Home Exercise Plan  upper trap stretch, scapular retraction, calf stretch, table slides flexion/ abduction, shoulder IR/ER, rows horizontal abduction, rib mobs     Consulted and Agree with Plan of Care  Patient       Patient will benefit from skilled therapeutic intervention in order to improve the following deficits and impairments:  Pain, Decreased endurance, Decreased strength, Increased fascial restricitons, Difficulty walking, Postural dysfunction, Improper body mechanics, Decreased activity tolerance, Decreased range of motion, Abnormal gait, Increased edema  Visit Diagnosis: Chronic right shoulder pain  Muscle weakness (generalized)  Pain in right ankle and joints of right foot  Other abnormalities of gait and mobility  Chronic bilateral low  back pain with left-sided sciatica     Problem List Patient Active Problem List   Diagnosis Date Noted  . Right ear pain 04/13/2018  . Vertigo 04/13/2018  . Prediabetes 10/17/2015  . Hypertriglyceridemia 10/17/2015  . Lumbar disc herniation 10/25/2014  . Spinal stenosis, lumbar region, with neurogenic claudication 10/24/2014  . Varicose veins of right leg with edema 01/30/2014   KStarr LakePT, DPT, LAT, ATC  10/19/18  3:01 PM      [facility] [address]Phone: [phone]  Fax:  [phone] Name:  DANIJELA VESSEY MRN: [id number] Date of Birth: 12-02-1965

## 2018-10-21 ENCOUNTER — Ambulatory Visit: Payer: BLUE CROSS/BLUE SHIELD | Admitting: Physical Therapy

## 2018-10-21 ENCOUNTER — Encounter: Payer: Self-pay | Admitting: Physical Therapy

## 2018-10-21 DIAGNOSIS — R2689 Other abnormalities of gait and mobility: Secondary | ICD-10-CM | POA: Diagnosis not present

## 2018-10-21 DIAGNOSIS — M5442 Lumbago with sciatica, left side: Secondary | ICD-10-CM | POA: Diagnosis not present

## 2018-10-21 DIAGNOSIS — M25511 Pain in right shoulder: Secondary | ICD-10-CM | POA: Diagnosis not present

## 2018-10-21 DIAGNOSIS — G8929 Other chronic pain: Secondary | ICD-10-CM

## 2018-10-21 DIAGNOSIS — M6281 Muscle weakness (generalized): Secondary | ICD-10-CM

## 2018-10-21 DIAGNOSIS — M25571 Pain in right ankle and joints of right foot: Secondary | ICD-10-CM

## 2018-10-21 NOTE — Therapy (Signed)
[place], [place], [address] Phone: [phone]   Fax:  [phone]  Physical Therapy Treatment  Patient Details  Name: Kendra Rodriguez MRN: [id number] Date of Birth: 02/10/1966 Referring Provider (PT): Basil Dess MD   Encounter Date: 10/21/2018  PT End of Session - 10/21/18 1419    Visit Number  12    Number of Visits  21    Date for PT Re-Evaluation  11/22/18    PT Start Time  1419    PT Stop Time  1500    PT Time Calculation (min)  41 min    Activity Tolerance  Patient tolerated treatment well    Behavior During Therapy  [facility] for tasks assessed/performed       Past Medical History:  Diagnosis Date  . Acute sinusitis   . Allergy   . Anxiety   . Depression    NO MEDICATIONS - DOING OK NOW  . Dizziness   . Ear ache    RIGHT --HX OF MULTIPLE EAR INFECTIONS AS A CHILD   . GERD (gastroesophageal reflux disease)   . Headache(784.0)    MIGRAINES  . Leg pain, left    with swelling  . Lumbar stenosis    PAIN BACK AND LEFT LEG AND NUMBNESS LEG AND FOOT  . Peripheral vascular disease (HCC)    HX OF TREATMENT FOR VARICOSE VEINS RIGHT LEG  . Thyroid disease    TOLD BLOOD LEVELS SLIGHTLY ABNORMAL - BUT NOT REQUIRED MEDICATION    Past Surgical History:  Procedure Laterality Date  . ABDOMINAL HYSTERECTOMY    . bladder lift     AT SAME TIME OF HYSTERECTOMY  . CHOLECYSTECTOMY    . LUMBAR LAMINECTOMY/DECOMPRESSION MICRODISCECTOMY Left 10/24/2014   Procedure: COMPLETE LUMBAR DECOMPRESSION L4-L5 CENTRAL AND MICRODISCECTOMY L4-L5 LEFT;  Surgeon: Tobi Bastos, MD;  Location: [facility];  Service: Orthopedics;  Laterality: Left;    There were no vitals filed for this visit.  Subjective Assessment - 10/21/18 1419    Subjective  "most of the pain in the is inthe ankle the shoulder and back are around a 2/10"     Currently in Pain?  Yes    Pain Score  2     Pain Location  Shoulder    Pain Orientation   Right    Pain Descriptors / Indicators  Sore    Pain Type  Chronic pain    Pain Onset  More than a month ago    Pain Frequency  Intermittent    Pain Score  5    Pain Location  Ankle    Pain Orientation  Right    Pain Descriptors / Indicators  Aching;Sore    Pain Type  Chronic pain    Pain Onset  More than a month ago    Pain Frequency  Intermittent                       [facility] Adult PT Treatment/Exercise - 10/21/18 0001      Exercises   Exercises  Knee/Hip      Knee/Hip Exercises: Standing   Forward Step Up  2 sets;Both;15 reps   onto bosu     Shoulder Exercises: Standing   Extension  Strengthening;Both;10 reps    Theraband Level (Shoulder Extension)  Level 3 (Green)    Row  15 reps;Theraband    Theraband Level (Shoulder Row)  Level 3 (Green)    Other Standing  Exercises  lower trap y's 2 x 15 green theraband       Iontophoresis   Type of Iontophoresis  Dexamethasone    Location  R proxmial peroneal longus    Dose  31m/ml    Time  6 hour patch      Manual Therapy   Manual therapy comments  skilled palpation and monitoring of pt during TPDN    Joint Mobilization  proximal fibular posterior mobs grade 3     Soft tissue mobilization  IASTM along the peroneals      Ankle Exercises: Seated   ABC's  2 reps    Towel Inversion/Eversion  5 reps   bil RLE only      Trigger Point Dry Needling - 10/21/18 1431    Consent Given?  Yes    Education Handout Provided  No    Muscles Treated Lower Body  --   peroneals            PT Short Term Goals - 09/29/18 1519      PT SHORT TERM GOAL #1   Title  pt to be I with inital HEP    Time  4    Period  Weeks    Status  Achieved      PT SHORT TERM GOAL #2   Title  pt to verbalize techniques to calm down R shoulder and foot pain via RICE and HEP    Time  4    Period  Weeks    Status  Achieved      PT SHORT TERM GOAL #3   Title  pt to increase R grip strength by >/= 10# to demo improvement in shoulder  function    Time  4    Period  Weeks    Status  Unable to assess      PT SHORT TERM GOAL #4   Title  pt to be able to perofrm grooming and dressing with  </= 5/10 pain in the R shoulder    Time  4    Period  Weeks    Status  Achieved        PT Long Term Goals - 10/11/18 1241      PT LONG TERM GOAL #1   Title  increase R shoulder abduction/ flexion to >/= 130 and IR to L1, and ER to C6 to promote functional mobility required for ADLs with </= 2/10 pain     Time  8    Period  Weeks    Status  Partially Met      PT LONG TERM GOAL #2   Title  increase R shoulder strength to >/= 4/5 to promote functional lifting and carrying for ADLs with </= 2/10 pain     Time  8    Period  Weeks    Status  Partially Met    Target Date  11/22/18      PT LONG TERM GOAL #3   Title  increase R ankle strength to >/= 5/5 in all planes to promote stability with walking/ standing activities and maximize gait efficency    Time  8    Period  Weeks    Status  Unable to assess    Target Date  11/22/18      PT LONG TERM GOAL #4   Title  pt to increase FOTO score </= 35% limited to demo improvement in function     Time  8    Period  Weeks  Status  Unable to assess    Target Date  11/22/18      PT LONG TERM GOAL #5   Title  pt to be able to stand/ walk >/= 60 min with </= 2/10 pain for functional endurance required for household tasks     Time  8    Period  Weeks    Status  On-going    Target Date  11/22/18      Additional Long Term Goals   Additional Long Term Goals  Yes      PT LONG TERM GOAL #6   Title  pt to be i with all HEP given as of last visit to maintain and progress current function independently    Time  8    Period  Weeks    Status  On-going    Target Date  11/22/18      PT LONG TERM GOAL #7   Title  increase low back mobility to [facility] to with </= 2/10 pain for functional mobility required for ADLs    Time  6    Period  Weeks    Status  New    Target Date  11/22/18             Plan - 10/21/18 1500    Clinical Impression Statement  pt reports most of the pain in the ankle and some soreness in the shoulder and low back, focused primarily on the ankle today with TPDN along the peroneals followed with IASTM techniques and strengthening. she performed all shoulder strengthening today with  no report of pain or discomfort.     PT Treatment/Interventions  ADLs/Self Care Home Management;Cryotherapy;Electrical Stimulation;Iontophoresis 49m/ml Dexamethasone;Moist Heat;Ultrasound;Therapeutic activities;Therapeutic exercise;Manual techniques;Taping;Vasopneumatic Device;Dry needling;Passive range of motion;Patient/family education;Gait training;Balance training    PT Next Visit Plan  update HEP, shoulder ROM, STW, scapular mobs, peri-scapular strengthening.     PT Home Exercise Plan  upper trap stretch, scapular retraction, calf stretch, table slides flexion/ abduction, shoulder IR/ER, rows horizontal abduction, rib mobs     Consulted and Agree with Plan of Care  Patient       Patient will benefit from skilled therapeutic intervention in order to improve the following deficits and impairments:  Pain, Decreased endurance, Decreased strength, Increased fascial restricitons, Difficulty walking, Postural dysfunction, Improper body mechanics, Decreased activity tolerance, Decreased range of motion, Abnormal gait, Increased edema  Visit Diagnosis: Chronic right shoulder pain  Muscle weakness (generalized)  Pain in right ankle and joints of right foot  Other abnormalities of gait and mobility  Chronic bilateral low back pain with left-sided sciatica     Problem List Patient Active Problem List   Diagnosis Date Noted  . Right ear pain 04/13/2018  . Vertigo 04/13/2018  . Prediabetes 10/17/2015  . Hypertriglyceridemia 10/17/2015  . Lumbar disc herniation 10/25/2014  . Spinal stenosis, lumbar region, with neurogenic claudication 10/24/2014  . Varicose veins of  right leg with edema 01/30/2014   KStarr LakePT, DPT, LAT, ATC  10/21/18  3:51 PM     s [facility]GArbon [place] [address]Phone: [phone]  Fax:  [phone] Name: Kendra ALGEOMRN: [id number] of Birth: 908-21-1967

## 2018-10-25 ENCOUNTER — Ambulatory Visit: Payer: BLUE CROSS/BLUE SHIELD | Admitting: Physical Therapy

## 2018-10-25 ENCOUNTER — Encounter: Payer: Self-pay | Admitting: Physical Therapy

## 2018-10-25 DIAGNOSIS — M25571 Pain in right ankle and joints of right foot: Secondary | ICD-10-CM | POA: Diagnosis not present

## 2018-10-25 DIAGNOSIS — G8929 Other chronic pain: Secondary | ICD-10-CM | POA: Diagnosis not present

## 2018-10-25 DIAGNOSIS — R2689 Other abnormalities of gait and mobility: Secondary | ICD-10-CM

## 2018-10-25 DIAGNOSIS — M25511 Pain in right shoulder: Principal | ICD-10-CM

## 2018-10-25 DIAGNOSIS — M6281 Muscle weakness (generalized): Secondary | ICD-10-CM

## 2018-10-25 DIAGNOSIS — M5442 Lumbago with sciatica, left side: Secondary | ICD-10-CM

## 2018-10-25 NOTE — Therapy (Signed)
[place], [place], [address] Phone: [phone]   Fax:  [phone]  Physical Therapy Treatment  Patient Details  Name: Kendra Rodriguez MRN: [id number] Date of Birth: [birth year] Referring Provider (PT): Basil Dess MD   Encounter Date: 10/25/2018  PT End of Session - 10/25/18 1332    Visit Number  13    Number of Visits  21    Date for PT Re-Evaluation  11/22/18    PT Start Time  1332    PT Stop Time  1416    PT Time Calculation (min)  44 min    Activity Tolerance  Patient tolerated treatment well    Behavior During Therapy  [facility] for tasks assessed/performed       Past Medical History:  Diagnosis Date  . Acute sinusitis   . Allergy   . Anxiety   . Depression    NO MEDICATIONS - DOING OK NOW  . Dizziness   . Ear ache    RIGHT --HX OF MULTIPLE EAR INFECTIONS AS A CHILD   . GERD (gastroesophageal reflux disease)   . Headache(784.0)    MIGRAINES  . Leg pain, left    with swelling  . Lumbar stenosis    PAIN BACK AND LEFT LEG AND NUMBNESS LEG AND FOOT  . Peripheral vascular disease (HCC)    HX OF TREATMENT FOR VARICOSE VEINS RIGHT LEG  . Thyroid disease    TOLD BLOOD LEVELS SLIGHTLY ABNORMAL - BUT NOT REQUIRED MEDICATION    Past Surgical History:  Procedure Laterality Date  . ABDOMINAL HYSTERECTOMY    . bladder lift     AT SAME TIME OF HYSTERECTOMY  . CHOLECYSTECTOMY    . LUMBAR LAMINECTOMY/DECOMPRESSION MICRODISCECTOMY Left 10/24/2014   Procedure: COMPLETE LUMBAR DECOMPRESSION L4-L5 CENTRAL AND MICRODISCECTOMY L4-L5 LEFT;  Surgeon: Tobi Bastos, MD;  Location: [facility];  Service: Orthopedics;  Laterality: Left;    There were no vitals filed for this visit.  Subjective Assessment - 10/25/18 1333    Subjective  the shoulder and the back are at about 3/10 and burning the ankle"     Currently in Pain?  Yes    Pain Score  3     Pain Location  Shoulder    Pain Orientation  Right    Pain  Type  Chronic pain    Pain Onset  More than a month ago    Pain Frequency  Intermittent    Pain Score  3    Pain Descriptors / Indicators  Aching    Pain Onset  More than a month ago    Pain Frequency  Intermittent    Pain Score  0    Pain Location  Ankle    Pain Orientation  Right    Pain Descriptors / Indicators  Aching    Pain Type  Chronic pain                       [facility] Adult PT Treatment/Exercise - 10/25/18 0001      Shoulder Exercises: Seated   Horizontal ABduction  10 reps;Theraband;Strengthening;Both    Theraband Level (Shoulder Horizontal ABduction)  Level 3 (Green)    Other Seated Exercises  2 x 10 with green theraband lower trap strengthening while seated on green physioball      Manual Therapy   Manual Therapy  Other (comment)    Manual therapy comments  skilled palpation and monitoring of pt during  TPDN   MTPR along the R lumbar paraspinals   Joint Mobilization  proximal fibular posterior mobs grade 3, talocrural  distraction Level 5    Soft tissue mobilization  IASTM along the peroneals    Other Manual Therapy  edema reduction massage with pt in prone    Muscle Energy Technique  prone resisted R hip flexor 10 x 10 sec       Ankle Exercises: Seated   Other Seated Ankle Exercises  ankle eversion with manual resistance 2 x 10       Trigger Point Dry Needling - 10/25/18 1348    Consent Given?  Yes    Education Handout Provided  No    Muscles Treated Lower Body  --   peroneal longus/ brevis on the R            PT Short Term Goals - 09/29/18 1519      PT SHORT TERM GOAL #1   Title  pt to be I with inital HEP    Time  4    Period  Weeks    Status  Achieved      PT SHORT TERM GOAL #2   Title  pt to verbalize techniques to calm down R shoulder and foot pain via RICE and HEP    Time  4    Period  Weeks    Status  Achieved      PT SHORT TERM GOAL #3   Title  pt to increase R grip strength by >/= 10# to demo improvement in shoulder  function    Time  4    Period  Weeks    Status  Unable to assess      PT SHORT TERM GOAL #4   Title  pt to be able to perofrm grooming and dressing with  </= 5/10 pain in the R shoulder    Time  4    Period  Weeks    Status  Achieved        PT Long Term Goals - 10/11/18 1241      PT LONG TERM GOAL #1   Title  increase R shoulder abduction/ flexion to >/= 130 and IR to L1, and ER to C6 to promote functional mobility required for ADLs with </= 2/10 pain     Time  8    Period  Weeks    Status  Partially Met      PT LONG TERM GOAL #2   Title  increase R shoulder strength to >/= 4/5 to promote functional lifting and carrying for ADLs with </= 2/10 pain     Time  8    Period  Weeks    Status  Partially Met    Target Date  11/22/18      PT LONG TERM GOAL #3   Title  increase R ankle strength to >/= 5/5 in all planes to promote stability with walking/ standing activities and maximize gait efficency    Time  8    Period  Weeks    Status  Unable to assess    Target Date  11/22/18      PT LONG TERM GOAL #4   Title  pt to increase FOTO score </= 35% limited to demo improvement in function     Time  8    Period  Weeks    Status  Unable to assess    Target Date  11/22/18      PT LONG TERM  GOAL #5   Title  pt to be able to stand/ walk >/= 60 min with </= 2/10 pain for functional endurance required for household tasks     Time  8    Period  Weeks    Status  On-going    Target Date  11/22/18      Additional Long Term Goals   Additional Long Term Goals  Yes      PT LONG TERM GOAL #6   Title  pt to be i with all HEP given as of last visit to maintain and progress current function independently    Time  8    Period  Weeks    Status  On-going    Target Date  11/22/18      PT LONG TERM GOAL #7   Title  increase low back mobility to [facility] to with </= 2/10 pain for functional mobility required for ADLs    Time  6    Period  Weeks    Status  New    Target Date  11/22/18             Plan - 10/25/18 1417    Clinical Impression Statement  pt reports only 3/10 pain in the back and the shoulder. continued TPDN along the R peroneals followed with IASMT and ankle mobs. pt noted no burning following treatment. continued core and shoulder strengthening. end of session she reported no pain.     PT Treatment/Interventions  ADLs/Self Care Home Management;Cryotherapy;Electrical Stimulation;Iontophoresis 52m/ml Dexamethasone;Moist Heat;Ultrasound;Therapeutic activities;Therapeutic exercise;Manual techniques;Taping;Vasopneumatic Device;Dry needling;Passive range of motion;Patient/family education;Gait training;Balance training    PT Next Visit Plan  update HEP, shoulder ROM, STW, scapular mobs, peri-scapular strengthening.     PT Home Exercise Plan  upper trap stretch, scapular retraction, calf stretch, table slides flexion/ abduction, shoulder IR/ER, rows horizontal abduction, rib mobs     Consulted and Agree with Plan of Care  Patient       Patient will benefit from skilled therapeutic intervention in order to improve the following deficits and impairments:  Pain, Decreased endurance, Decreased strength, Increased fascial restricitons, Difficulty walking, Postural dysfunction, Improper body mechanics, Decreased activity tolerance, Decreased range of motion, Abnormal gait, Increased edema  Visit Diagnosis: Chronic right shoulder pain  Muscle weakness (generalized)  Pain in right ankle and joints of right foot  Other abnormalities of gait and mobility  Chronic bilateral low back pain with left-sided sciatica     Problem List Patient Active Problem List   Diagnosis Date Noted  . Right ear pain 04/13/2018  . Vertigo 04/13/2018  . Prediabetes 10/17/2015  . Hypertriglyceridemia 10/17/2015  . Lumbar disc herniation 10/25/2014  . Spinal stenosis, lumbar region, with neurogenic claudication 10/24/2014  . Varicose veins of right leg with edema 01/30/2014    KStarr LakePT, DPT, LAT, ATC  10/25/18  2:18 PM      CLacassineCFirst[facility] Treatment[address]GMay[place] [address]Phone: [phone]  Fax:  [phone] Name: Kendra KOZUBMRN: [id number] of Birth: 9Apr 19, 1967

## 2018-10-26 ENCOUNTER — Ambulatory Visit: Payer: BLUE CROSS/BLUE SHIELD | Admitting: Physical Therapy

## 2018-11-01 ENCOUNTER — Ambulatory Visit: Payer: BLUE CROSS/BLUE SHIELD | Admitting: Physical Therapy

## 2018-11-01 ENCOUNTER — Encounter: Payer: Self-pay | Admitting: Physical Therapy

## 2018-11-01 DIAGNOSIS — R2689 Other abnormalities of gait and mobility: Secondary | ICD-10-CM

## 2018-11-01 DIAGNOSIS — M6281 Muscle weakness (generalized): Secondary | ICD-10-CM | POA: Diagnosis not present

## 2018-11-01 DIAGNOSIS — G8929 Other chronic pain: Secondary | ICD-10-CM

## 2018-11-01 DIAGNOSIS — M25511 Pain in right shoulder: Principal | ICD-10-CM

## 2018-11-01 DIAGNOSIS — M5442 Lumbago with sciatica, left side: Secondary | ICD-10-CM | POA: Diagnosis not present

## 2018-11-01 DIAGNOSIS — M25571 Pain in right ankle and joints of right foot: Secondary | ICD-10-CM | POA: Diagnosis not present

## 2018-11-01 NOTE — Therapy (Signed)
[place], [place], [address] Phone: [phone]   Fax:  [phone]  Physical Therapy Treatment  Patient Details  Name: Kendra Rodriguez MRN: [id number] Date of Birth: 1966/09/27 Referring Provider (PT): Basil Dess MD   Encounter Date: 11/01/2018  PT End of Session - 11/01/18 1406    Visit Number  14    Number of Visits  21    Date for PT Re-Evaluation  11/22/18    PT Start Time  1406    PT Stop Time  1445    PT Time Calculation (min)  39 min    Activity Tolerance  Patient tolerated treatment well    Behavior During Therapy  [facility] for tasks assessed/performed       Past Medical History:  Diagnosis Date  . Acute sinusitis   . Allergy   . Anxiety   . Depression    NO MEDICATIONS - DOING OK NOW  . Dizziness   . Ear ache    RIGHT --HX OF MULTIPLE EAR INFECTIONS AS A CHILD   . GERD (gastroesophageal reflux disease)   . Headache(784.0)    MIGRAINES  . Leg pain, left    with swelling  . Lumbar stenosis    PAIN BACK AND LEFT LEG AND NUMBNESS LEG AND FOOT  . Peripheral vascular disease (HCC)    HX OF TREATMENT FOR VARICOSE VEINS RIGHT LEG  . Thyroid disease    TOLD BLOOD LEVELS SLIGHTLY ABNORMAL - BUT NOT REQUIRED MEDICATION    Past Surgical History:  Procedure Laterality Date  . ABDOMINAL HYSTERECTOMY    . bladder lift     AT SAME TIME OF HYSTERECTOMY  . CHOLECYSTECTOMY    . LUMBAR LAMINECTOMY/DECOMPRESSION MICRODISCECTOMY Left 10/24/2014   Procedure: COMPLETE LUMBAR DECOMPRESSION L4-L5 CENTRAL AND MICRODISCECTOMY L4-L5 LEFT;  Surgeon: Tobi Bastos, MD;  Location: [facility];  Service: Orthopedics;  Laterality: Left;    There were no vitals filed for this visit.  Subjective Assessment - 11/01/18 1408    Subjective  The shoulder is better, the back still hast a 3/10 and mild burning 3/10    Currently in Pain?  Yes    Pain Score  0-No pain    Pain Location  Shoulder    Pain Orientation   Right    Pain Type  Chronic pain    Pain Frequency  Occasional    Aggravating Factors   reaching overhead    Pain Relieving Factors  laying     Pain Score  3    Pain Location  Ankle    Pain Orientation  Right    Pain Descriptors / Indicators  Burning    Pain Type  Chronic pain    Pain Onset  More than a month ago    Pain Frequency  Intermittent    Aggravating Factors   standing/ walking     Pain Relieving Factors  taping    Pain Score  3    Pain Location  Ankle    Pain Orientation  Right    Pain Descriptors / Indicators  Aching    Pain Onset  More than a month ago    Pain Frequency  Intermittent    Aggravating Factors   standing for prolong periods of time    Pain Relieving Factors  unsure                       [facility] Adult PT Treatment/Exercise -  11/01/18 0001      Lumbar Exercises: Supine   Dead Bug  --   5 x 20 second     Knee/Hip Exercises: Seated   Sit to Sand  2 sets;15 reps;without UE support   with blue theraband around the knees     Knee/Hip Exercises: Supine   Straight Leg Raises  2 sets;15 reps   4#     Knee/Hip Exercises: Sidelying   Hip ABduction  2 sets;20 reps   4#     Shoulder Exercises: Supine   Protraction  20 reps;Weights   CW/ CCW with sustained protraction   Protraction Weight (lbs)  3    Other Supine Exercises  lower trap 2 x 15 red theraband       Manual Therapy   Manual therapy comments  MTPR along the peroneal longus x 3    McConnell  arch support taping on the R arch   no pain in the foot with walking              PT Short Term Goals - 09/29/18 1519      PT SHORT TERM GOAL #1   Title  pt to be I with inital HEP    Time  4    Period  Weeks    Status  Achieved      PT SHORT TERM GOAL #2   Title  pt to verbalize techniques to calm down R shoulder and foot pain via RICE and HEP    Time  4    Period  Weeks    Status  Achieved      PT SHORT TERM GOAL #3   Title  pt to increase R grip strength by >/= 10#  to demo improvement in shoulder function    Time  4    Period  Weeks    Status  Unable to assess      PT SHORT TERM GOAL #4   Title  pt to be able to perofrm grooming and dressing with  </= 5/10 pain in the R shoulder    Time  4    Period  Weeks    Status  Achieved        PT Long Term Goals - 10/11/18 1241      PT LONG TERM GOAL #1   Title  increase R shoulder abduction/ flexion to >/= 130 and IR to L1, and ER to C6 to promote functional mobility required for ADLs with </= 2/10 pain     Time  8    Period  Weeks    Status  Partially Met      PT LONG TERM GOAL #2   Title  increase R shoulder strength to >/= 4/5 to promote functional lifting and carrying for ADLs with </= 2/10 pain     Time  8    Period  Weeks    Status  Partially Met    Target Date  11/22/18      PT LONG TERM GOAL #3   Title  increase R ankle strength to >/= 5/5 in all planes to promote stability with walking/ standing activities and maximize gait efficency    Time  8    Period  Weeks    Status  Unable to assess    Target Date  11/22/18      PT LONG TERM GOAL #4   Title  pt to increase FOTO score </= 35% limited to demo improvement in function  Time  8    Period  Weeks    Status  Unable to assess    Target Date  11/22/18      PT LONG TERM GOAL #5   Title  pt to be able to stand/ walk >/= 60 min with </= 2/10 pain for functional endurance required for household tasks     Time  8    Period  Weeks    Status  On-going    Target Date  11/22/18      Additional Long Term Goals   Additional Long Term Goals  Yes      PT LONG TERM GOAL #6   Title  pt to be i with all HEP given as of last visit to maintain and progress current function independently    Time  8    Period  Weeks    Status  On-going    Target Date  11/22/18      PT LONG TERM GOAL #7   Title  increase low back mobility to [facility] to with </= 2/10 pain for functional mobility required for ADLs    Time  6    Period  Weeks    Status  New     Target Date  11/22/18            Plan - 11/01/18 1446    Clinical Impression Statement  continued STW along peroneals and arch taping which pt notes complete relief of pain in the foot as well as reduction of soreness inthe low back. continued hip / core strengthening as well as scapular stability. She noted mild soreness in the shoulder but otherwise had no pain following treatment.     PT Next Visit Plan  update HEP, shoulder ROM, STW, scapular mobs, peri-scapular strengthening. ROM, DN for the low back    PT Home Exercise Plan  upper trap stretch, scapular retraction, calf stretch, table slides flexion/ abduction, shoulder IR/ER, rows horizontal abduction, rib mobs     Consulted and Agree with Plan of Care  Patient       Patient will benefit from skilled therapeutic intervention in order to improve the following deficits and impairments:  Pain, Decreased endurance, Decreased strength, Increased fascial restricitons, Difficulty walking, Postural dysfunction, Improper body mechanics, Decreased activity tolerance, Decreased range of motion, Abnormal gait, Increased edema  Visit Diagnosis: Chronic right shoulder pain  Muscle weakness (generalized)  Pain in right ankle and joints of right foot  Other abnormalities of gait and mobility     Problem List Patient Active Problem List   Diagnosis Date Noted  . Right ear pain 04/13/2018  . Vertigo 04/13/2018  . Prediabetes 10/17/2015  . Hypertriglyceridemia 10/17/2015  . Lumbar disc herniation 10/25/2014  . Spinal stenosis, lumbar region, with neurogenic claudication 10/24/2014  . Varicose veins of right leg with edema 01/30/2014   Starr Lake PT, DPT, LAT, ATC  11/01/18  2:52 PM      [facility] [address] [place], [place], [address] Phone: [phone]   Fax:  [phone]  Name: RACHELL DRUCKENMILLER MRN: [id number] Date of Birth: June 14, 1966

## 2018-11-02 ENCOUNTER — Ambulatory Visit: Payer: BLUE CROSS/BLUE SHIELD | Admitting: Physical Therapy

## 2018-11-09 ENCOUNTER — Encounter: Payer: Self-pay | Admitting: Physical Therapy

## 2018-11-09 ENCOUNTER — Ambulatory Visit: Payer: BLUE CROSS/BLUE SHIELD | Attending: Specialist | Admitting: Physical Therapy

## 2018-11-09 DIAGNOSIS — M25511 Pain in right shoulder: Secondary | ICD-10-CM | POA: Insufficient documentation

## 2018-11-09 DIAGNOSIS — R2689 Other abnormalities of gait and mobility: Secondary | ICD-10-CM | POA: Insufficient documentation

## 2018-11-09 DIAGNOSIS — M25571 Pain in right ankle and joints of right foot: Secondary | ICD-10-CM | POA: Insufficient documentation

## 2018-11-09 DIAGNOSIS — M6281 Muscle weakness (generalized): Secondary | ICD-10-CM | POA: Diagnosis not present

## 2018-11-09 DIAGNOSIS — G8929 Other chronic pain: Secondary | ICD-10-CM | POA: Insufficient documentation

## 2018-11-09 DIAGNOSIS — M5442 Lumbago with sciatica, left side: Secondary | ICD-10-CM | POA: Insufficient documentation

## 2018-11-09 NOTE — Therapy (Signed)
[place], [place], [address] Phone: [phone]   Fax:  [phone]  Physical Therapy Treatment / Discharge summary  Patient Details  Name: SELENY ALLBRIGHT MRN: [id number] Date of Birth: [birth year] Referring Provider (PT): Basil Dess MD   Encounter Date: 11/09/2018  PT End of Session - 11/09/18 1420    Visit Number  15    Number of Visits  21    Date for PT Re-Evaluation  11/22/18    PT Start Time  6962    PT Stop Time  1456    PT Time Calculation (min)  38 min    Activity Tolerance  Patient tolerated treatment well    Behavior During Therapy  [facility] for tasks assessed/performed       Past Medical History:  Diagnosis Date  . Acute sinusitis   . Allergy   . Anxiety   . Depression    NO MEDICATIONS - DOING OK NOW  . Dizziness   . Ear ache    RIGHT --HX OF MULTIPLE EAR INFECTIONS AS A CHILD   . GERD (gastroesophageal reflux disease)   . Headache(784.0)    MIGRAINES  . Leg pain, left    with swelling  . Lumbar stenosis    PAIN BACK AND LEFT LEG AND NUMBNESS LEG AND FOOT  . Peripheral vascular disease (HCC)    HX OF TREATMENT FOR VARICOSE VEINS RIGHT LEG  . Thyroid disease    TOLD BLOOD LEVELS SLIGHTLY ABNORMAL - BUT NOT REQUIRED MEDICATION    Past Surgical History:  Procedure Laterality Date  . ABDOMINAL HYSTERECTOMY    . bladder lift     AT SAME TIME OF HYSTERECTOMY  . CHOLECYSTECTOMY    . LUMBAR LAMINECTOMY/DECOMPRESSION MICRODISCECTOMY Left 10/24/2014   Procedure: COMPLETE LUMBAR DECOMPRESSION L4-L5 CENTRAL AND MICRODISCECTOMY L4-L5 LEFT;  Surgeon: Tobi Bastos, MD;  Location: [facility];  Service: Orthopedics;  Laterality: Left;    There were no vitals filed for this visit.  Subjective Assessment - 11/09/18 1421    Subjective  " I am doing much better ,I did stop to and get new shoes and inserts and I only have alittle burning which is much better.     Currently in Pain?  Yes    Pain  Score  1     Pain Location  Shoulder    Pain Orientation  Right    Pain Descriptors / Indicators  Sore    Pain Type  Chronic pain    Pain Onset  More than a month ago    Pain Frequency  Intermittent    Pain Score  1    Pain Location  Ankle    Pain Orientation  Right    Pain Descriptors / Indicators  Sore    Pain Onset  More than a month ago    Pain Frequency  Intermittent    Aggravating Factors   prlonged standing/ wlaking     Pain Onset  More than a month ago    Pain Frequency  Intermittent         [facility] PT Assessment - 11/09/18 0001      Observation/Other Assessments   Focus on Therapeutic Outcomes (FOTO)   26% limited      AROM   Right Shoulder Flexion  145 Degrees    Right Shoulder ABduction  134 Degrees    Right Shoulder Internal Rotation  --   L3   Right Shoulder External Rotation  --  C7   Lumbar Flexion  60    Lumbar Extension  15    Lumbar - Right Side Bend  15    Lumbar - Left Side Bend  15      Strength   Right Shoulder Flexion  4+/5    Right Shoulder Extension  5/5    Right Shoulder ABduction  4+/5    Right Shoulder Internal Rotation  4+/5    Right Shoulder External Rotation  4/5    Right Ankle Dorsiflexion  5/5    Right Ankle Plantar Flexion  5/5    Right Ankle Inversion  5/5    Right Ankle Eversion  5/5                   [facility] Adult PT Treatment/Exercise - 11/09/18 0001      Lumbar Exercises: Stretches   Lower Trunk Rotation  5 reps;20 seconds    Quadruped Mid Back Stretch  2 reps;30 seconds      Knee/Hip Exercises: Stretches   Gastroc Stretch  2 reps;30 seconds    Soleus Stretch  2 reps;30 seconds      Manual Therapy   Manual therapy comments  skilled palpation and monitoring of pt during TPDN    Soft tissue mobilization  IASTM along the R lumbar paraspinals    Other Manual Therapy  MTPR along R peroneal longus             PT Education - 11/09/18 1456    Education Details  reviewed previously provided HEP, updated  for low back stretching and ROM. importance of continued strengthening and continued use of inserts in the shoes    Person(s) Educated  Patient    Methods  Explanation;Verbal cues;Handout    Comprehension  Verbalized understanding;Verbal cues required       PT Short Term Goals - 09/29/18 1519      PT SHORT TERM GOAL #1   Title  pt to be I with inital HEP    Time  4    Period  Weeks    Status  Achieved      PT SHORT TERM GOAL #2   Title  pt to verbalize techniques to calm down R shoulder and foot pain via RICE and HEP    Time  4    Period  Weeks    Status  Achieved      PT SHORT TERM GOAL #3   Title  pt to increase R grip strength by >/= 10# to demo improvement in shoulder function    Time  4    Period  Weeks    Status  Unable to assess      PT SHORT TERM GOAL #4   Title  pt to be able to perofrm grooming and dressing with  </= 5/10 pain in the R shoulder    Time  4    Period  Weeks    Status  Achieved        PT Long Term Goals - 11/09/18 1455      PT LONG TERM GOAL #1   Title  increase R shoulder abduction/ flexion to >/= 130 and IR to L1, and ER to C6 to promote functional mobility required for ADLs with </= 2/10 pain     Time  8    Period  Weeks    Status  Achieved      PT LONG TERM GOAL #2   Title  increase R shoulder strength to >/=  4/5 to promote functional lifting and carrying for ADLs with </= 2/10 pain     Time  8    Period  Weeks    Status  Achieved      PT LONG TERM GOAL #3   Title  increase R ankle strength to >/= 5/5 in all planes to promote stability with walking/ standing activities and maximize gait efficency    Time  8    Period  Weeks    Status  Achieved      PT LONG TERM GOAL #4   Title  pt to increase FOTO score </= 35% limited to demo improvement in function     Time  8    Period  Weeks    Status  Achieved      PT LONG TERM GOAL #5   Title  pt to be able to stand/ walk >/= 60 min with </= 2/10 pain for functional endurance required  for household tasks     Time  8    Period  Weeks    Status  Achieved      PT LONG TERM GOAL #6   Title  pt to be i with all HEP given as of last visit to maintain and progress current function independently    Time  8    Period  Weeks    Status  Achieved      PT LONG TERM GOAL #7   Title  increase low back mobility to [facility] to with </= 2/10 pain for functional mobility required for ADLs    Baseline  pain fluctuates up to a 4/10    Time  6    Period  Weeks    Status  Partially Met            Plan - 11/09/18 1457    Clinical Impression Statement  Mrs Dafoe has made great progress with physical therapy increasing R shoulder, trunk and ankle mobilty and strength. performed TPDN along the R low back followed with IASTM to calm down residual muscle soreness. She is able to maintainand progress her current level of function independently and will be discharged from pt today.     PT Treatment/Interventions  ADLs/Self Care Home Management;Cryotherapy;Electrical Stimulation;Iontophoresis 50m/ml Dexamethasone;Moist Heat;Ultrasound;Therapeutic activities;Therapeutic exercise;Manual techniques;Taping;Vasopneumatic Device;Dry needling;Passive range of motion;Patient/family education;Gait training;Balance training    PT Next Visit Plan  update HEP, shoulder ROM, STW, scapular mobs, peri-scapular strengthening. ROM, DN for the low back    PT Home Exercise Plan  upper trap stretch, scapular retraction, calf stretch, table slides flexion/ abduction, shoulder IR/ER, rows horizontal abduction, rib mobs     Consulted and Agree with Plan of Care  Patient       Patient will benefit from skilled therapeutic intervention in order to improve the following deficits and impairments:  Pain, Decreased endurance, Decreased strength, Increased fascial restricitons, Difficulty walking, Postural dysfunction, Improper body mechanics, Decreased activity tolerance, Decreased range of motion, Abnormal gait, Increased  edema  Visit Diagnosis: Chronic right shoulder pain  Muscle weakness (generalized)  Other abnormalities of gait and mobility  Pain in right ankle and joints of right foot  Chronic bilateral low back pain with left-sided sciatica     Problem List Patient Active Problem List   Diagnosis Date Noted  . Right ear pain 04/13/2018  . Vertigo 04/13/2018  . Prediabetes 10/17/2015  . Hypertriglyceridemia 10/17/2015  . Lumbar disc herniation 10/25/2014  . Spinal stenosis, lumbar region, with neurogenic claudication 10/24/2014  . Varicose  veins of right leg with edema 01/30/2014    Starr Lake 11/09/2018, 3:01 PM  [place], [place], [address] Phone: [phone]   Fax:  [phone]  Name: QUINTAVIA ROGSTAD MRN: [id number] Date of Birth: 1965/12/27        PHYSICAL THERAPY DISCHARGE SUMMARY  Visits from Start of Care: 15  Current functional level related to goals / functional outcomes: See goals,    Remaining deficits: Intermittent R ankle soreness/ burning that is reduced with stretching, intermittent R shoulder and R low back pain. See above assessment   Education / Equipment: HEP, theraband, posture, benefits orthotics  Plan: Patient agrees to discharge.  Patient goals were met. Patient is being discharged due to being pleased with the current functional level.  ?????        PT, DPT, LAT, ATC  11/09/18  3:02 PM

## 2018-11-09 NOTE — Patient Instructions (Signed)
Kendra Rodriguez

## 2018-11-18 ENCOUNTER — Ambulatory Visit (INDEPENDENT_AMBULATORY_CARE_PROVIDER_SITE_OTHER): Payer: Self-pay

## 2018-11-18 ENCOUNTER — Ambulatory Visit (INDEPENDENT_AMBULATORY_CARE_PROVIDER_SITE_OTHER): Payer: BLUE CROSS/BLUE SHIELD | Admitting: Specialist

## 2018-11-18 ENCOUNTER — Encounter (INDEPENDENT_AMBULATORY_CARE_PROVIDER_SITE_OTHER): Payer: Self-pay | Admitting: Specialist

## 2018-11-18 VITALS — BP 129/78 | HR 68 | Ht 62.0 in | Wt 196.0 lb

## 2018-11-18 DIAGNOSIS — M19031 Primary osteoarthritis, right wrist: Secondary | ICD-10-CM | POA: Diagnosis not present

## 2018-11-18 DIAGNOSIS — M25531 Pain in right wrist: Secondary | ICD-10-CM | POA: Diagnosis not present

## 2018-11-18 DIAGNOSIS — M7581 Other shoulder lesions, right shoulder: Secondary | ICD-10-CM

## 2018-11-18 DIAGNOSIS — M778 Other enthesopathies, not elsewhere classified: Secondary | ICD-10-CM

## 2018-11-18 MED ORDER — DICLOFENAC SODIUM 1 % TD GEL: 4.0000 g | Freq: Four times a day (QID) | TRANSDERMAL | 1 refills | Status: DC

## 2018-11-18 NOTE — Patient Instructions (Signed)
Plan: The main ways of treat osteoarthritis, that are found to be success. Use voltaren gel applied to the right wrist TID or QID Exercise is important to maintaining cartilage and thickness and strengthening. Use a splint for the right wrist while working to decrease pain due to increased stress.  Ice is okay  In afternoon and evening and hot shower in the am Warm soaks with soapy water or Epsum salts may help.  Shoulder stretching exercises at home.

## 2018-11-18 NOTE — Progress Notes (Signed)
Office Visit Note   Patient: Kendra Rodriguez           Date of Birth: 1966-04-20           MRN: [id number] Visit Date: 11/18/2018              Requested by: Loletta Specter, PA-C No address on file PCP: Loletta Specter, PA-C   Assessment & Plan: Visit Diagnoses:  1. Pain in right wrist   2. Primary osteoarthritis of right wrist   3. Right shoulder tendonitis     Plan: The main ways of treat osteoarthritis, that are found to be success. Use voltaren gel applied to the right wrist TID or QID Exercise is important to maintaining cartilage and thickness and strengthening. Use a splint for the right wrist while working to decrease pain due to increased stress.  Ice is okay  In afternoon and evening and hot shower in the am Warm soaks with soapy water or Epsum salts may help.  Shoulder stretching exercises at home.    Follow-Up Instructions: No follow-ups on file.   Orders:  Orders Placed This Encounter  Procedures  . XR Wrist Complete Right   Meds ordered this encounter  Medications  . diclofenac sodium (VOLTAREN) 1 % GEL    Sig: Apply 4 g topically 4 (four) times daily.    Dispense:  3 Tube    Refill:  1      Procedures: No procedures performed   Clinical Data: No additional findings.   Subjective: Chief Complaint  Patient presents with  . Right Shoulder - Pain    53 year old female with history of LBP, right shoulder, right wrist and right heel pain. Underwent evaluation and was found to have right wrist and shoulder tendonitis, right plantar fascitis and lumbar DDD.  She has been to PT with improvement in back pain and right heel pain. Is wearing a tennis shoe with a good arch support and heal padidn   Review of Systems  Constitutional: Negative.   HENT: Negative.   Eyes: Negative.   Respiratory: Negative.   Cardiovascular: Negative.   Gastrointestinal: Negative.   Endocrine: Negative.   Genitourinary: Negative.   Musculoskeletal: Negative.     Skin: Negative.   Allergic/Immunologic: Negative.   Neurological: Negative.   Hematological: Negative.   Psychiatric/Behavioral: Negative.      Objective: Vital Signs: BP 129/78   Pulse 68   Ht 5\' 2"  (1.575 m)   Wt 196 lb (88.9 kg)   BMI 35.85 kg/m   Physical Exam Constitutional:      Appearance: She is well-developed.  HENT:     Head: Normocephalic and atraumatic.  Eyes:     Pupils: Pupils are equal, round, and reactive to light.  Neck:     Musculoskeletal: Normal range of motion and neck supple.  Pulmonary:     Effort: Pulmonary effort is normal.     Breath sounds: Normal breath sounds.  Abdominal:     General: Bowel sounds are normal.     Palpations: Abdomen is soft.  Skin:    General: Skin is warm and dry.  Neurological:     Mental Status: She is alert and oriented to person, place, and time.  Psychiatric:        Behavior: Behavior normal.        Thought Content: Thought content normal.        Judgment: Judgment normal.     Right Shoulder Exam  Tenderness  The patient is experiencing no tenderness.  Range of Motion  Active abduction: abnormal  Passive abduction: normal  Extension: normal  External rotation: abnormal  Forward flexion: normal  Internal rotation 0 degrees: normal  Internal rotation 90 degrees: normal   Muscle Strength  Abduction: 5/5  External rotation: 5/5  Supraspinatus: 4/5  Subscapularis: 5/5  Biceps: 5/5   Tests  Apprehension: negative Hawkins test: negative Cross arm: negative Impingement: negative Drop arm: negative Sulcus: absent  Other  Erythema: absent Scars: absent Sensation: normal Pulse: present      Specialty Comments:  No specialty comments available.  Imaging: Xr Wrist Complete Right  Result Date: 11/18/2018 Right wrist with narrowing of the radiocarpal joint by about 50% compared with the ulna side of the radiocarpal joint. Sharpening of the ulna styloid and spurs off the thumb MC-C joint  radially.     PMFS History: Patient Active Problem List   Diagnosis Date Noted  . Right ear pain 04/13/2018  . Vertigo 04/13/2018  . Prediabetes 10/17/2015  . Hypertriglyceridemia 10/17/2015  . Lumbar disc herniation 10/25/2014  . Spinal stenosis, lumbar region, with neurogenic claudication 10/24/2014  . Varicose veins of right leg with edema 01/30/2014   Past Medical History:  Diagnosis Date  . Acute sinusitis   . Allergy   . Anxiety   . Depression    NO MEDICATIONS - DOING OK NOW  . Dizziness   . Ear ache    RIGHT --HX OF MULTIPLE EAR INFECTIONS AS A CHILD   . GERD (gastroesophageal reflux disease)   . Headache(784.0)    MIGRAINES  . Leg pain, left    with swelling  . Lumbar stenosis    PAIN BACK AND LEFT LEG AND NUMBNESS LEG AND FOOT  . Peripheral vascular disease (HCC)    HX OF TREATMENT FOR VARICOSE VEINS RIGHT LEG  . Thyroid disease    TOLD BLOOD LEVELS SLIGHTLY ABNORMAL - BUT NOT REQUIRED MEDICATION    Family History  Problem Relation Age of Onset  . Hypertension Mother   . Arthritis Mother   . Diabetes Mother     Past Surgical History:  Procedure Laterality Date  . ABDOMINAL HYSTERECTOMY    . bladder lift     AT SAME TIME OF HYSTERECTOMY  . CHOLECYSTECTOMY    . LUMBAR LAMINECTOMY/DECOMPRESSION MICRODISCECTOMY Left 10/24/2014   Procedure: COMPLETE LUMBAR DECOMPRESSION L4-L5 CENTRAL AND MICRODISCECTOMY L4-L5 LEFT;  Surgeon: Jacki Conesonald A Gioffre, MD;  Location: [facility];  Service: Orthopedics;  Laterality: Left;   Social History   Occupational History  . Not on file  Tobacco Use  . Smoking status: Never Smoker  . Smokeless tobacco: Never Used  Substance and Sexual Activity  . Alcohol use: No  . Drug use: No  . Sexual activity: Yes

## 2018-12-07 ENCOUNTER — Other Ambulatory Visit: Payer: Self-pay

## 2018-12-07 ENCOUNTER — Encounter: Payer: Self-pay | Admitting: Physical Therapy

## 2018-12-07 ENCOUNTER — Ambulatory Visit: Payer: BLUE CROSS/BLUE SHIELD | Attending: Specialist | Admitting: Physical Therapy

## 2018-12-07 DIAGNOSIS — M6281 Muscle weakness (generalized): Secondary | ICD-10-CM | POA: Diagnosis not present

## 2018-12-07 DIAGNOSIS — R2689 Other abnormalities of gait and mobility: Secondary | ICD-10-CM | POA: Diagnosis not present

## 2018-12-07 DIAGNOSIS — M25511 Pain in right shoulder: Secondary | ICD-10-CM | POA: Insufficient documentation

## 2018-12-07 DIAGNOSIS — M25531 Pain in right wrist: Secondary | ICD-10-CM | POA: Insufficient documentation

## 2018-12-07 DIAGNOSIS — G8929 Other chronic pain: Secondary | ICD-10-CM | POA: Diagnosis not present

## 2018-12-07 NOTE — Therapy (Signed)
[facility] [address] [place], [place], [address] Phone: [phone]   Fax:  [phone]  Physical Therapy Evaluation  Patient Details  Name: Kendra Rodriguez MRN: [id number] Date of Birth: January 12, 1966 Referring Provider (PT): Vira BrownsJames Nitka MD   Encounter Date: 12/07/2018  PT End of Session - 12/07/18 1643    Visit Number  1    Number of Visits  7    Date for PT Re-Evaluation  01/18/19    PT Start Time  1547    PT Stop Time  1630    PT Time Calculation (min)  43 min    Activity Tolerance  Patient tolerated treatment well    Behavior During Therapy  [facility] for tasks assessed/performed       Past Medical History:  Diagnosis Date  . Acute sinusitis   . Allergy   . Anxiety   . Depression    NO MEDICATIONS - DOING OK NOW  . Dizziness   . Ear ache    RIGHT --HX OF MULTIPLE EAR INFECTIONS AS A CHILD   . GERD (gastroesophageal reflux disease)   . Headache(784.0)    MIGRAINES  . Leg pain, left    with swelling  . Lumbar stenosis    PAIN BACK AND LEFT LEG AND NUMBNESS LEG AND FOOT  . Peripheral vascular disease (HCC)    HX OF TREATMENT FOR VARICOSE VEINS RIGHT LEG  . Thyroid disease    TOLD BLOOD LEVELS SLIGHTLY ABNORMAL - BUT NOT REQUIRED MEDICATION    Past Surgical History:  Procedure Laterality Date  . ABDOMINAL HYSTERECTOMY    . bladder lift     AT SAME TIME OF HYSTERECTOMY  . CHOLECYSTECTOMY    . LUMBAR LAMINECTOMY/DECOMPRESSION MICRODISCECTOMY Left 10/24/2014   Procedure: COMPLETE LUMBAR DECOMPRESSION L4-L5 CENTRAL AND MICRODISCECTOMY L4-L5 LEFT;  Surgeon: Jacki Conesonald A Gioffre, MD;  Location: [facility];  Service: Orthopedics;  Laterality: Left;    There were no vitals filed for this visit.   Subjective Assessment - 12/07/18 1551    Subjective  "My shoulder pain started in about October or November with no specific cause. My wrist always hurts, but the MD did some XRAYs and there is something wrong with the bone. My wrist  started hurting around the same time with no known cause. Pt reports some numbness and tingling in all of her fingers.  I can do everything but with some pain.     Limitations  Walking;Standing    Patient Stated Goals  decrease pain    Currently in Pain?  Yes    Pain Score  5     Pain Location  Wrist    Pain Orientation  Right    Pain Descriptors / Indicators  Aching;Sore    Pain Type  Chronic pain    Pain Radiating Towards  to fingers    Pain Onset  More than a month ago    Pain Frequency  Intermittent    Aggravating Factors   chopping during cooking    Pain Relieving Factors  rest    Multiple Pain Sites  Yes    Pain Score  0    Pain Location  Shoulder    Pain Orientation  Right    Aggravating Factors   cooking or doing chores    Pain Relieving Factors  exercises from previous therapy sessions         [facility] - 12/07/18 0001      Assessment   Medical  Diagnosis  R shoulder and R wrist pain    Referring Provider (PT)  [name]    Onset Date/Surgical Date  --   October 2019   Hand Dominance  Right    Next MD Visit  6 weeks    Prior Therapy  yes      Precautions   Precautions  None      Restrictions   Weight Bearing Restrictions  No      Balance Screen   Has the patient fallen in the past 6 months  No      Home Environment   Living Environment  Private residence    Living Arrangements  Spouse/significant other;Children    Available Help at Discharge  Family    Type of Home  House    Home Access  Stairs to enter    Entrance Stairs-Number of Steps  5    Entrance Stairs-Rails  Can reach both    Home Layout  One level      Prior Function   Level of Independence  Independent with basic ADLs    Vocation  Unemployed      Cognition   Overall Cognitive Status  Within Functional Limits for tasks assessed      Observation/Other Assessments   Focus on Therapeutic Outcomes (FOTO)   54% limited   predicted 44% limited     AROM   Overall AROM Comments   bilat wrist WFL, and bil shoulder WFL    AROM Assessment Site  Wrist    Right/Left Shoulder  Right;Left    Right/Left Wrist  Left      Strength   Strength Assessment Site  Wrist;Forearm    Right Shoulder Flexion  4/5    Right Shoulder Extension  5/5    Right Shoulder ABduction  4/5   pain during tsting   Right Shoulder Internal Rotation  5/5    Right Shoulder External Rotation  5/5    Left Shoulder Flexion  4/5    Left Shoulder Extension  5/5    Left Shoulder ABduction  4/5    Left Shoulder Internal Rotation  5/5    Left Shoulder External Rotation  5/5    Right/Left Forearm  Right;Left    Right Forearm Pronation  4-/5   reproduction of concordant pain   Right Forearm Supination  4+/5    Left Forearm Pronation  5/5    Left Forearm Supination  5/5    Right/Left Wrist  Right;Left    Right Wrist Flexion  5/5    Right Wrist Extension  5/5    Left Wrist Flexion  5/5    Left Wrist Extension  5/5    Right Hand Grip (lbs)  18   24,14,16   Left Hand Grip (lbs)  33.3   34,30,36     Palpation   Palpation comment  TTP along radiocarpal/ ulnarcarpal joint Palmar > dorsal. For shoulder sub-acromial space/ long head of the biceps      Special Tests   Other special tests  postive tinels sign with N/T into the 2nd and 3rd digits.                 Objective measurements completed on examination: See above findings.      [facility] Adult PT Treatment/Exercise - 12/07/18 0001      Exercises   Exercises  Wrist      Wrist Exercises   Other wrist exercises  wrist flexor/ extensor stretch 2 x 30  ea.      Iontophoresis   Type of Iontophoresis  Dexamethasone    Location  R wrist palmar side    Dose  4mg /ml    Time  6 hour patch             PT Education - 12/07/18 1643    Education Details  evaluation findings, POC, goals, HEp with proper form/ rationale, reivew iontophoresis, benefits and length of wear.     Person(s) Educated  Patient    Methods  Explanation;Verbal  cues    Comprehension  Verbalized understanding;Verbal cues required       PT Short Term Goals - 12/07/18 1650      PT SHORT TERM GOAL #1   Title  pt to be I with inital HEP    Time  3    Period  Weeks    Status  New    Target Date  12/28/18        PT Long Term Goals - 12/07/18 1651      PT LONG TERM GOAL #1   Title  incresae R shoulder strength in all planes to >/=4+/5 in all planes to promote shoulder stability with </= 1/10 during assessment     Time  6    Period  Weeks    Status  New    Target Date  01/18/19      PT LONG TERM GOAL #2   Title  Increase R wrist strength to >4+/5 to promote stability with gripping/ lifting  and manipulating items    Time  6    Period  Weeks    Status  New    Target Date  01/18/19      PT LONG TERM GOAL #3   Title  improve FOTO score to </= 44% limited to demo improvement in function    Time  6    Period  Weeks    Status  New    Target Date  01/18/19      PT LONG TERM GOAL #4   Title  pt to be I with all HEP given as of last visit to maintain and progress current level function    Time  6    Status  New    Target Date  01/18/19             Plan - 12/07/18 1644    Clinical Impression Statement  pt presents to OPPT with CC of R shoulder and R wrist pain. she demonstrates functional mobility in the shoulder and wrist. weakness with reproduction of concorant pain with resisted wrist pronation and extension, and shoulder pain with abduction/ flexion.  She was seen recently for R shoulder pain which she notes improvement with exercise. applied ionto patch today along wrist. she would benfit from physical therapy to improve wrist/ shoulder pain, increase strength and overall function by addressing the deficits listed.     History and Personal Factors relevant to plan of care:  spanish speaking pt, hx of anxiety    Clinical Presentation  Evolving    Clinical Presentation due to:  chronic pain shoulder, Wrist pain,  general weakness,      Clinical Decision Making  Moderate    Rehab Potential  Good    PT Frequency  1x / week    PT Duration  6 weeks    PT Treatment/Interventions  ADLs/Self Care Home Management;Cryotherapy;Electrical Stimulation;Iontophoresis 4mg /ml Dexamethasone;Moist Heat;Ultrasound;Therapeutic activities;Therapeutic exercise;Manual techniques;Taping;Vasopneumatic Device;Dry needling;Passive range of motion;Patient/family education;Gait training;Balance training  PT Next Visit Plan  review/ update HEP, how was ionto, wrist strengtheing, functional shoulder strengthening,     PT Home Exercise Plan  wrist flexor/ extensor stretching, gripping with towel, tendon glides    Consulted and Agree with Plan of Care  Patient       Patient will benefit from skilled therapeutic intervention in order to improve the following deficits and impairments:  Pain, Decreased endurance, Decreased strength, Increased fascial restricitons, Difficulty walking, Postural dysfunction, Improper body mechanics, Decreased activity tolerance, Decreased range of motion, Abnormal gait, Increased edema  Visit Diagnosis: Chronic right shoulder pain  Pain in right wrist  Muscle weakness (generalized)     Problem List Patient Active Problem List   Diagnosis Date Noted  . Right ear pain 04/13/2018  . Vertigo 04/13/2018  . Prediabetes 10/17/2015  . Hypertriglyceridemia 10/17/2015  . Lumbar disc herniation 10/25/2014  . Spinal stenosis, lumbar region, with neurogenic claudication 10/24/2014  . Varicose veins of right leg with edema 01/30/2014   Lulu RidingKristoffer Amrie Gurganus PT, DPT, LAT, ATC  12/07/18  5:09 PM      [facility] [address] [place], [place], [address] Phone: [phone]   Fax:  [phone]  Name: Kendra Rodriguez MRN: [id number] Date of Birth: September 15, 1966

## 2018-12-16 ENCOUNTER — Ambulatory Visit: Payer: BLUE CROSS/BLUE SHIELD | Admitting: Physical Therapy

## 2018-12-20 DIAGNOSIS — R7303 Prediabetes: Secondary | ICD-10-CM | POA: Diagnosis not present

## 2018-12-20 DIAGNOSIS — K649 Unspecified hemorrhoids: Secondary | ICD-10-CM | POA: Diagnosis not present

## 2018-12-20 DIAGNOSIS — Z1231 Encounter for screening mammogram for malignant neoplasm of breast: Secondary | ICD-10-CM | POA: Diagnosis not present

## 2018-12-20 DIAGNOSIS — Z01411 Encounter for gynecological examination (general) (routine) with abnormal findings: Secondary | ICD-10-CM | POA: Diagnosis not present

## 2018-12-20 DIAGNOSIS — E559 Vitamin D deficiency, unspecified: Secondary | ICD-10-CM | POA: Diagnosis not present

## 2018-12-20 DIAGNOSIS — R5383 Other fatigue: Secondary | ICD-10-CM | POA: Diagnosis not present

## 2018-12-20 DIAGNOSIS — R350 Frequency of micturition: Secondary | ICD-10-CM | POA: Diagnosis not present

## 2018-12-23 ENCOUNTER — Ambulatory Visit: Payer: BLUE CROSS/BLUE SHIELD | Admitting: Physical Therapy

## 2018-12-23 ENCOUNTER — Encounter: Payer: Self-pay | Admitting: Physical Therapy

## 2018-12-23 DIAGNOSIS — M25511 Pain in right shoulder: Principal | ICD-10-CM

## 2018-12-23 DIAGNOSIS — M6281 Muscle weakness (generalized): Secondary | ICD-10-CM

## 2018-12-23 DIAGNOSIS — R2689 Other abnormalities of gait and mobility: Secondary | ICD-10-CM | POA: Diagnosis not present

## 2018-12-23 DIAGNOSIS — G8929 Other chronic pain: Secondary | ICD-10-CM | POA: Diagnosis not present

## 2018-12-23 DIAGNOSIS — M25531 Pain in right wrist: Secondary | ICD-10-CM

## 2018-12-23 NOTE — Therapy (Addendum)
[place], [place], [address] Phone: [phone]   Fax:  [phone]  Physical Therapy Treatment / Discharge  Patient Details  Name: Kendra Rodriguez MRN: [id number] Date of Birth: 01/03/1966 Referring Provider (PT): Basil Dess MD   Encounter Date: 12/23/2018  PT End of Session - 12/23/18 1404    Visit Number  2    Number of Visits  7    Date for PT Re-Evaluation  01/18/19    PT Start Time  1404    PT Stop Time  1454    PT Time Calculation (min)  50 min    Activity Tolerance  Patient tolerated treatment well    Behavior During Therapy  [facility] for tasks assessed/performed       Past Medical History:  Diagnosis Date  . Acute sinusitis   . Allergy   . Anxiety   . Depression    NO MEDICATIONS - DOING OK NOW  . Dizziness   . Ear ache    RIGHT --HX OF MULTIPLE EAR INFECTIONS AS A CHILD   . GERD (gastroesophageal reflux disease)   . Headache(784.0)    MIGRAINES  . Leg pain, left    with swelling  . Lumbar stenosis    PAIN BACK AND LEFT LEG AND NUMBNESS LEG AND FOOT  . Peripheral vascular disease (HCC)    HX OF TREATMENT FOR VARICOSE VEINS RIGHT LEG  . Thyroid disease    TOLD BLOOD LEVELS SLIGHTLY ABNORMAL - BUT NOT REQUIRED MEDICATION    Past Surgical History:  Procedure Laterality Date  . ABDOMINAL HYSTERECTOMY    . bladder lift     AT SAME TIME OF HYSTERECTOMY  . CHOLECYSTECTOMY    . LUMBAR LAMINECTOMY/DECOMPRESSION MICRODISCECTOMY Left 10/24/2014   Procedure: COMPLETE LUMBAR DECOMPRESSION L4-L5 CENTRAL AND MICRODISCECTOMY L4-L5 LEFT;  Surgeon: Tobi Bastos, MD;  Location: [facility];  Service: Orthopedics;  Laterality: Left;    There were no vitals filed for this visit.  Subjective Assessment - 12/23/18 1406    Subjective  "    Currently in Pain?  Yes    Pain Score  5     Pain Location  Wrist    Pain Orientation  Right    Pain Type  Chronic pain    Pain Onset  More than a month ago     Pain Frequency  Intermittent    Aggravating Factors   using the R wrist    Pain Relieving Factors  resting    Pain Score  3    Pain Orientation  Right    Pain Descriptors / Indicators  Aching;Sore    Pain Type  Chronic pain    Aggravating Factors   unknown    Pain Relieving Factors  exercise from previous                       [facility] Adult PT Treatment/Exercise - 12/23/18 0001      Shoulder Exercises: Seated   Retraction  20 reps;Theraband   performed during iontphoresis   Horizontal ABduction  Strengthening;10 reps;Theraband    Theraband Level (Shoulder Horizontal ABduction)  Level 4 (Blue)    Other Seated Exercises  pilates circle 2 x 10 pushing in and 2 x 10 pulling with shoulder flexion    Other Seated Exercises  seated lower trap strengthening with elbows on bolster 2 x 10 with manual resitance   performed during ionto     Wrist  Exercises   Other wrist exercises  wrist flexor/ extensor stretch 2 x 30 ea.    Other wrist exercises  tendon glides x 10 hlding each position 5 seconds      Iontophoresis   Type of Iontophoresis  Dexamethasone    Location  R wrist palmar side    Dose  46m/ml    Time  16 min, L 3.1 Ma      Manual Therapy   Manual therapy comments  MTPR along bil upper trap/levator scapulae, wrist flexors and supinator   performed during iontophoresis   Neural Stretch  median nerve glides 2 x 10             PT Education - 12/23/18 1457    Education Details  updated HEP for median nerve glides, reviewed in house iontophoresis treatment versus 6 hour patch    Person(s) Educated  Patient    Methods  Explanation;Verbal cues;Handout    Comprehension  Verbalized understanding;Verbal cues required       PT Short Term Goals - 12/07/18 1650      PT SHORT TERM GOAL #1   Title  pt to be I with inital HEP    Time  3    Period  Weeks    Status  New    Target Date  12/28/18        PT Long Term Goals - 12/07/18 1651      PT LONG TERM GOAL  #1   Title  incresae R shoulder strength in all planes to >/=4+/5 in all planes to promote shoulder stability with </= 1/10 during assessment     Time  6    Period  Weeks    Status  New    Target Date  01/18/19      PT LONG TERM GOAL #2   Title  Increase R wrist strength to >4+/5 to promote stability with gripping/ lifting  and manipulating items    Time  6    Period  Weeks    Status  New    Target Date  01/18/19      PT LONG TERM GOAL #3   Title  improve FOTO score to </= 44% limited to demo improvement in function    Time  6    Period  Weeks    Status  New    Target Date  01/18/19      PT LONG TERM GOAL #4   Title  pt to be I with all HEP given as of last visit to maintain and progress current level function    Time  6    Status  New    Target Date  01/18/19            Plan - 12/23/18 1457    Clinical Impression Statement  pt ocntinues to report pain in the R wrist and shoulder. She demonstrates signs and symptoms consistent with carpal tunnel syndrome. continued tendon glides adding median nerve glides. applied in house ionto and continued working on shoulder strengthening to promote scapular stability. end of session she reported decreased wrist soreness and shoulder pain.     PT Next Visit Plan  review/ update HEP, how was ionto, wrist strengtheing, functional shoulder strengthening, how was ionto    PT Home Exercise Plan  wrist flexor/ extensor stretching, gripping with towel, tendon glides    Consulted and Agree with Plan of Care  Patient       Patient will benefit from skilled  therapeutic intervention in order to improve the following deficits and impairments:     Visit Diagnosis: Chronic right shoulder pain  Pain in right wrist  Muscle weakness (generalized)  Other abnormalities of gait and mobility     Problem List Patient Active Problem List   Diagnosis Date Noted  . Right ear pain 04/13/2018  . Vertigo 04/13/2018  . Prediabetes 10/17/2015  .  Hypertriglyceridemia 10/17/2015  . Lumbar disc herniation 10/25/2014  . Spinal stenosis, lumbar region, with neurogenic claudication 10/24/2014  . Varicose veins of right leg with edema 01/30/2014   Starr Lake PT, DPT, LAT, ATC  12/23/18  3:00 PM      [place], [place], [address] Phone: [phone]   Fax:  [phone]  Name: KAELANI KENDRICK MRN: [id number] Date of Birth: 09-Feb-1966       PHYSICAL THERAPY DISCHARGE SUMMARY  Visits from Start of Care: 2  Current functional level related to goals / functional outcomes: See goals   Remaining deficits: Unknown due to COVID related clinic closure   Education / Equipment: HEP  Plan: Patient agrees to discharge.  Patient goals were not met. Patient is being discharged due to not returning since the last visit.  ?????

## 2018-12-29 ENCOUNTER — Encounter: Payer: BLUE CROSS/BLUE SHIELD | Admitting: Physical Therapy

## 2018-12-30 ENCOUNTER — Ambulatory Visit (INDEPENDENT_AMBULATORY_CARE_PROVIDER_SITE_OTHER): Payer: BLUE CROSS/BLUE SHIELD | Admitting: Specialist

## 2019-01-06 ENCOUNTER — Ambulatory Visit: Payer: BLUE CROSS/BLUE SHIELD | Admitting: Physical Therapy

## 2019-01-10 ENCOUNTER — Encounter: Payer: BLUE CROSS/BLUE SHIELD | Admitting: Physical Therapy

## 2019-01-11 ENCOUNTER — Telehealth: Payer: Self-pay | Admitting: Physical Therapy

## 2019-01-11 NOTE — Telephone Encounter (Signed)
LVM regarding missed appointment on Monday. Discussed that she had no more visits scheduled and if she would like to continue to call back and we can get her back on the schedule. If she feels she no longer requires physical therapy we can discharge her from PT. Either way if she could call us back and let us know how she would like to proceed.

## 2019-01-20 ENCOUNTER — Ambulatory Visit (INDEPENDENT_AMBULATORY_CARE_PROVIDER_SITE_OTHER): Payer: BLUE CROSS/BLUE SHIELD | Admitting: Primary Care

## 2019-01-20 ENCOUNTER — Telehealth: Payer: BLUE CROSS/BLUE SHIELD | Admitting: Family

## 2019-01-20 DIAGNOSIS — J302 Other seasonal allergic rhinitis: Secondary | ICD-10-CM | POA: Diagnosis not present

## 2019-01-20 MED ORDER — LEVOCETIRIZINE DIHYDROCHLORIDE 5 MG PO TABS: 5.0000 mg | ORAL_TABLET | Freq: Every evening | ORAL | 0 refills | Status: DC

## 2019-01-20 NOTE — Progress Notes (Signed)
E visit for Allergic Rhinitis We are sorry that you are not feeling well.  Here is how we plan to help!  Based on what you have shared with me it looks like you have Allergic Rhinitis.  Rhinitis is when a reaction occurs that causes nasal congestion, runny nose, sneezing, and itching.  Most types of rhinitis are caused by an inflammation and are associated with symptoms in the eyes ears or throat. There are several types of rhinitis.  The most common are acute rhinitis, which is usually caused by a viral illness, allergic or seasonal rhinitis, and nonallergic or year-round rhinitis.  Nasal allergies occur certain times of the year.  Allergic rhinitis is caused when allergens in the air trigger the release of histamine in the body.  Histamine causes itching, swelling, and fluid to build up in the fragile linings of the nasal passages, sinuses and eyelids.  An itchy nose and clear discharge are common.  I recommend the following over the counter treatments: Xyzal 5 mg take 1 tablet daily    HOME CARE:   You can use an over-the-counter saline nasal spray as needed  Avoid areas where there is heavy dust, mites, or molds  Stay indoors on windy days during the pollen season  Keep windows closed in home, at least in bedroom; use air conditioner.  Use high-efficiency house air filter  Keep windows closed in car, turn AC on re-circulate  Avoid playing out with dog during pollen season  GET HELP RIGHT AWAY IF:   If your symptoms do not improve within 10 days  You become short of breath  You develop yellow or green discharge from your nose for over 3 days  You have coughing fits  MAKE SURE YOU:   Understand these instructions  Will watch your condition  Will get help right away if you are not doing well or get worse  Thank you for choosing an e-visit. Your e-visit answers were reviewed by a board certified advanced clinical practitioner to complete your personal care plan.  Depending upon the condition, your plan could have included both over the counter or prescription medications. Please review your pharmacy choice. Be sure that the pharmacy you have chosen is open so that you can pick up your prescription now.  If there is a problem you may message your provider in MyChart to have the prescription routed to another pharmacy. Your safety is important to us. If you have drug allergies check your prescription carefully.  For the next 24 hours, you can use MyChart to ask questions about today's visit, request a non-urgent call back, or ask for a work or school excuse from your e-visit provider. You will get an email in the next two days asking about your experience. I hope that your e-visit has been valuable and will speed your recovery.        

## 2019-02-20 ENCOUNTER — Telehealth: Payer: BLUE CROSS/BLUE SHIELD | Admitting: Nurse Practitioner

## 2019-02-20 DIAGNOSIS — J329 Chronic sinusitis, unspecified: Secondary | ICD-10-CM

## 2019-02-20 DIAGNOSIS — B9789 Other viral agents as the cause of diseases classified elsewhere: Secondary | ICD-10-CM

## 2019-02-20 MED ORDER — FLUTICASONE PROPIONATE 50 MCG/ACT NA SUSP: 2.0000 | Freq: Every day | NASAL | 6 refills | Status: DC

## 2019-02-20 NOTE — Progress Notes (Signed)

## 2019-03-08 DIAGNOSIS — R194 Change in bowel habit: Secondary | ICD-10-CM | POA: Diagnosis not present

## 2019-03-08 DIAGNOSIS — R1033 Periumbilical pain: Secondary | ICD-10-CM | POA: Diagnosis not present

## 2019-03-08 DIAGNOSIS — R1310 Dysphagia, unspecified: Secondary | ICD-10-CM | POA: Diagnosis not present

## 2019-03-08 DIAGNOSIS — K219 Gastro-esophageal reflux disease without esophagitis: Secondary | ICD-10-CM | POA: Diagnosis not present

## 2019-03-14 ENCOUNTER — Encounter: Payer: Self-pay | Admitting: Physician Assistant

## 2019-03-14 DIAGNOSIS — D122 Benign neoplasm of ascending colon: Secondary | ICD-10-CM | POA: Diagnosis not present

## 2019-03-14 DIAGNOSIS — K635 Polyp of colon: Secondary | ICD-10-CM | POA: Diagnosis not present

## 2019-03-14 DIAGNOSIS — R1033 Periumbilical pain: Secondary | ICD-10-CM | POA: Diagnosis not present

## 2019-03-14 DIAGNOSIS — R1310 Dysphagia, unspecified: Secondary | ICD-10-CM | POA: Diagnosis not present

## 2019-03-14 DIAGNOSIS — K219 Gastro-esophageal reflux disease without esophagitis: Secondary | ICD-10-CM | POA: Diagnosis not present

## 2019-03-14 DIAGNOSIS — K5140 Inflammatory polyps of colon without complications: Secondary | ICD-10-CM | POA: Diagnosis not present

## 2019-03-14 DIAGNOSIS — R194 Change in bowel habit: Secondary | ICD-10-CM | POA: Diagnosis not present

## 2019-03-14 DIAGNOSIS — Z1211 Encounter for screening for malignant neoplasm of colon: Secondary | ICD-10-CM | POA: Diagnosis not present

## 2019-03-31 ENCOUNTER — Telehealth: Payer: BLUE CROSS/BLUE SHIELD | Admitting: Physician Assistant

## 2019-03-31 DIAGNOSIS — J310 Chronic rhinitis: Secondary | ICD-10-CM | POA: Diagnosis not present

## 2019-03-31 MED ORDER — LEVOCETIRIZINE DIHYDROCHLORIDE 5 MG PO TABS: 5.0000 mg | ORAL_TABLET | Freq: Every evening | ORAL | 0 refills | Status: DC

## 2019-03-31 NOTE — Progress Notes (Signed)
I have spent 5 minutes in review of e-visit questionnaire, review and updating patient chart, medical decision making and response to patient.   Keenan Dimitrov Cody Bunnie Lederman, PA-C    

## 2019-03-31 NOTE — Progress Notes (Signed)
E visit for Allergic Rhinitis We are sorry that you are not feeling well.  Here is how we plan to help!  Based on what you have shared with me it looks like you have Allergic Rhinitis.  Rhinitis is when a reaction occurs that causes nasal congestion, runny nose, sneezing, and itching.  Most types of rhinitis are caused by an inflammation and are associated with symptoms in the eyes ears or throat. There are several types of rhinitis.  The most common are acute rhinitis, which is usually caused by a viral illness, allergic or seasonal rhinitis, and nonallergic or year-round rhinitis.  Nasal allergies occur certain times of the year.  Allergic rhinitis is caused when allergens in the air trigger the release of histamine in the body.  Histamine causes itching, swelling, and fluid to build up in the fragile linings of the nasal passages, sinuses and eyelids.  An itchy nose and clear discharge are common.  I recommend the following over the counter treatments: You should take a daily dose of antihistamine and Xyzal 5 mg take 1 tablet daily - a prescription has been sent in.  I also would recommend a nasal spray: Nasonex 2 sprays into each nostril once daily - continue this over-the-counter  You may also benefit from eye drops such as: Systane 1-2 driops each eye twice daily as needed - can get over-the-counter.  Reviewing your chart it seems you have had prior visits for these issues. I recommend you follow-up with your primary care provider for further assessment and ongoing management of these symptoms.  HOME CARE:   You can use an over-the-counter saline nasal spray as needed  Avoid areas where there is heavy dust, mites, or molds  Stay indoors on windy days during the pollen season  Keep windows closed in home, at least in bedroom; use air conditioner.  Use high-efficiency house air filter  Keep windows closed in car, turn AC on re-circulate  Avoid playing out with dog during pollen  season  GET HELP RIGHT AWAY IF:   If your symptoms do not improve within 10 days  You become short of breath  You develop yellow or green discharge from your nose for over 3 days  You have coughing fits  MAKE SURE YOU:   Understand these instructions  Will watch your condition  Will get help right away if you are not doing well or get worse  Thank you for choosing an e-visit. Your e-visit answers were reviewed by a board certified advanced clinical practitioner to complete your personal care plan. Depending upon the condition, your plan could have included both over the counter or prescription medications. Please review your pharmacy choice. Be sure that the pharmacy you have chosen is open so that you can pick up your prescription now.  If there is a problem you may message your provider in MyChart to have the prescription routed to another pharmacy. Your safety is important to [place]. If you have drug allergies check your prescription carefully.  For the next 24 hours, you can use MyChart to ask questions about today's visit, request a non-urgent call back, or ask for a work or school excuse from your e-visit provider. You will get an email in the next two days asking about your experience. I hope that your e-visit has been valuable and will speed your recovery.

## 2019-04-12 DIAGNOSIS — R110 Nausea: Secondary | ICD-10-CM | POA: Diagnosis not present

## 2019-04-12 DIAGNOSIS — R1011 Right upper quadrant pain: Secondary | ICD-10-CM | POA: Diagnosis not present

## 2019-04-12 DIAGNOSIS — K219 Gastro-esophageal reflux disease without esophagitis: Secondary | ICD-10-CM | POA: Diagnosis not present

## 2019-04-19 ENCOUNTER — Other Ambulatory Visit: Payer: Self-pay | Admitting: Gastroenterology

## 2019-04-19 DIAGNOSIS — G8929 Other chronic pain: Secondary | ICD-10-CM

## 2019-04-19 DIAGNOSIS — R1031 Right lower quadrant pain: Secondary | ICD-10-CM

## 2019-04-19 DIAGNOSIS — R1011 Right upper quadrant pain: Secondary | ICD-10-CM

## 2019-05-11 ENCOUNTER — Ambulatory Visit
Admission: RE | Admit: 2019-05-11 | Discharge: 2019-05-11 | Disposition: A | Discharge status: Home / Self Care | Payer: BC Managed Care – PPO | Source: Ambulatory Visit | Attending: Gastroenterology | Admitting: Gastroenterology

## 2019-05-11 DIAGNOSIS — R1011 Right upper quadrant pain: Secondary | ICD-10-CM

## 2019-05-11 DIAGNOSIS — Z9049 Acquired absence of other specified parts of digestive tract: Secondary | ICD-10-CM | POA: Diagnosis not present

## 2019-05-11 DIAGNOSIS — G8929 Other chronic pain: Secondary | ICD-10-CM

## 2019-05-11 DIAGNOSIS — K7689 Other specified diseases of liver: Secondary | ICD-10-CM | POA: Diagnosis not present

## 2019-05-11 MED ORDER — GADOBENATE DIMEGLUMINE 529 MG/ML IV SOLN
18.0000 mL | Freq: Once | INTRAVENOUS | Status: AC | PRN
  Administered 2019-05-11: 18 mL via INTRAVENOUS

## 2019-05-14 ENCOUNTER — Other Ambulatory Visit: Payer: BLUE CROSS/BLUE SHIELD

## 2019-06-02 ENCOUNTER — Other Ambulatory Visit: Payer: Self-pay

## 2019-06-02 ENCOUNTER — Encounter: Payer: Self-pay | Admitting: Emergency Medicine

## 2019-06-02 ENCOUNTER — Ambulatory Visit: Payer: BC Managed Care – PPO | Admitting: Emergency Medicine

## 2019-06-02 VITALS — BP 144/80 | HR 85 | Temp 98.6°F | Resp 16 | Wt 194.8 lb

## 2019-06-02 DIAGNOSIS — R7303 Prediabetes: Secondary | ICD-10-CM

## 2019-06-02 DIAGNOSIS — K219 Gastro-esophageal reflux disease without esophagitis: Secondary | ICD-10-CM | POA: Diagnosis not present

## 2019-06-02 DIAGNOSIS — J0110 Acute frontal sinusitis, unspecified: Secondary | ICD-10-CM

## 2019-06-02 DIAGNOSIS — Z7689 Persons encountering health services in other specified circumstances: Secondary | ICD-10-CM

## 2019-06-02 DIAGNOSIS — J3489 Other specified disorders of nose and nasal sinuses: Secondary | ICD-10-CM

## 2019-06-02 DIAGNOSIS — E7841 Elevated Lipoprotein(a): Secondary | ICD-10-CM | POA: Diagnosis not present

## 2019-06-02 MED ORDER — ATORVASTATIN CALCIUM 40 MG PO TABS: 40.0000 mg | ORAL_TABLET | Freq: Every day | ORAL | 3 refills | Status: DC

## 2019-06-02 MED ORDER — METFORMIN HCL 850 MG PO TABS: 850.0000 mg | ORAL_TABLET | Freq: Two times a day (BID) | ORAL | 3 refills | Status: DC

## 2019-06-02 MED ORDER — AMOXICILLIN-POT CLAVULANATE 875-125 MG PO TABS: 1.0000 | ORAL_TABLET | Freq: Two times a day (BID) | ORAL | 0 refills | Status: AC

## 2019-06-02 MED ORDER — DEXILANT 60 MG PO CPDR: 60.0000 mg | DELAYED_RELEASE_CAPSULE | Freq: Every morning | ORAL | 3 refills | Status: DC

## 2019-06-02 NOTE — Patient Instructions (Addendum)
   If you have lab work done today you will be contacted with your lab results within the next 2 weeks.  If you have not heard from us then please contact us. The fastest way to get your results is to register for My Chart.   IF you received an x-ray today, you will receive an invoice from [facility]. Please contact [facility] at [phone] with questions or concerns regarding your invoice.   IF you received labwork today, you will receive an invoice from [facility]. Please contact [facility] at [phone] with questions or concerns regarding your invoice.   Our billing staff will not be able to assist you with questions regarding bills from these companies.  You will be contacted with the lab results as soon as they are available. The fastest way to get your results is to activate your My Chart account. Instructions are located on the last page of this paperwork. If you have not heard from us regarding the results in 2 weeks, please contact this office.     Sinusitis, en adultos Sinusitis, Adult La sinusitis es el dolor y la hinchazn (inflamacin) de los senos paranasales. Los senos paranasales son espacios vacos en los huesos alrededor del rostro. Estos se encuentran en los siguientes lugares:  Alrededor de los ojos.  En la mitad de la frente.  Detrs de la nariz.  En los pmulos. Los senos paranasales y las fosas nasales estn cubiertos de un lquido llamado mucosidad. La mucosidad drena a travs de los senos paranasales. La hinchazn puede atrapar mucosidad en los senos paranasales. Esto permite que se desarrollen grmenes (bacterias, virus u hongos), lo que produce infecciones. La mayora de las veces, la causa de esta afeccin es un virus. Cules son las causas? Las causas de esta afeccin son:  Alergias.  Asma.  Grmenes.  Objetos que obstruyen la nariz o los senos paranasales.  Crecimientos en el interior de la nariz (plipos  nasales).  Sustancias qumicas o irritantes que estn presentes en el aire.  Hongos (poco frecuente). Qu incrementa el riesgo? Es ms probable que contraiga esta afeccin si:  Tiene debilitado el sistema de defensa del organismo (sistema inmunitario).  Nada o bucea mucho.  [place] aerosoles nasales en exceso.  Fuma. Cules son los signos o los sntomas? Los principales sntomas de esta afeccin son dolor y sensacin de presin alrededor de los senos paranasales. Otros sntomas pueden incluir los siguientes:  Nariz tapada (congestin nasal).  Goteo nasal (drenaje).  Hinchazn y calor en los senos paranasales.  Dolor de cabeza.  Dolor dental.  Tos que puede empeorar por la noche.  Mucosidad que se acumula en la garganta o la parte posterior de la nariz (goteo posnasal).  Incapacidad de sentir olores y sabores.  Estar muy cansado (fatiga).  Fiebre.  Dolor de garganta.  Mal aliento. Cmo se diagnostica? Esta afeccin se diagnostica en funcin de lo siguiente:  Sus sntomas.  Sus antecedentes mdicos.  Un examen fsico.  Pruebas para averiguar si la afeccin es de corta duracin (aguda) o de larga duracin (crnica). El mdico puede: ? Revisarle la nariz para detectar crecimientos (plipos). ? Revisarle los senos paranasales con una herramienta que tiene una luz (endoscopio). ? Revisar si tiene alergias o grmenes. ? Hacerle pruebas de diagnstico por imgenes, como una resonancia magntica (RM) o exploracin por tomografa computarizada (TC). Cmo se trata? El tratamiento de esta afeccin depende de la causa y si es de corta o de larga duracin.  Si la   causa es un virus, los sntomas deberan desaparecer solos en el trmino de 10das. Pueden darle medicamentos para aliviar los sntomas. Entre ellos, se incluyen los siguientes: ? Medicamentos para encoger el tejido inflamado de la nariz. ? Medicamentos para tratar alergias (antihistamnicos). ? Un aerosol  para tratar la hinchazn de las fosas nasales. ? Enjuagues que ayudan a eliminar la mucosidad espesa de la nariz (lavados con solucin salina nasal).  Si la causa es una bacteria, es posible que el mdico espere para averiguar si usted mejora sin tratamiento. Es posible que le den un antibitico si usted tiene: ? Una infeccin grave. ? El sistema de defensa del organismo debilitado.  Si la causa son crecimientos en la nariz, es posible que necesite una ciruga. Siga estas indicaciones en su casa: Medicamentos  Tome, use o aplique los medicamentos de venta libre y los recetados solamente como se lo haya indicado el mdico. Estos pueden incluir aerosoles nasales.  Si le recetaron un antibitico, tmelo como se lo haya indicado el mdico. No deje de tomar los antibiticos aunque comience a sentirse mejor. Hidrtese y humidifique los ambientes   Beba suficiente agua para mantener el pis (la orina) de color amarillo plido.  Use un humidificador de vapor fro para mantener la humedad de su hogar por encima del 50%.  Inhale vapor durante 10a 15minutos, de 3a 4veces al da, o como se lo haya indicado el mdico. Puede hacer esto en el bao con el vapor del agua caliente de la ducha.  Trate de no exponerse al aire fro o seco. Reposo  Descanse todo lo posible.  Duerma con la cabeza levantada (elevada).  Asegrese de dormir lo suficiente cada noche. Indicaciones generales   Pngase un pao caliente y hmedo en el rostro 3 a 4 veces al da, o con la frecuencia indicada por el mdico. Esto ayuda a calmar las molestias.  Lvese las manos frecuentemente con agua y jabn. Use un desinfectante para manos si no dispone de agua y jabn.  No fume. Evite estar cerca de personas que fuman (fumador pasivo).  Concurra a todas las visitas de seguimiento como se lo haya indicado el mdico. Esto es importante. Comunquese con un mdico si:  Tiene fiebre.  Sus sntomas empeoran.  Los  sntomas no mejoran en el perodo de 10das. Solicite ayuda inmediatamente si:  Tiene un dolor de cabeza muy intenso.  No puede dejar de vomitar.  Tiene dolor muy intenso o hinchazn en la zona del rostro o los ojos.  Tiene dificultad para ver.  Se siente confundido.  Tiene el cuello rgido.  Tiene dificultad para respirar. Resumen  La sinusitis es la hinchazn de los senos paranasales. Los senos paranasales son espacios vacos en los huesos alrededor del rostro.  La causa de esta afeccin es la inflamacin o hinchazn de los tejidos que estn en el interior de la nariz. Esto hace que los grmenes queden atrapados. Los grmenes pueden provocar infecciones.  Si le recetaron un antibitico, tmelo como se lo haya indicado el mdico. No deje de tomarlo aunque comience a sentirse mejor.  Concurra a todas las visitas de seguimiento como se lo haya indicado el mdico. Esto es importante. Esta informacin no tiene como fin reemplazar el consejo del mdico. Asegrese de hacerle al mdico cualquier pregunta que tenga. Document Released: 07/14/2012 Document Revised: 05/05/2018 Document Reviewed: 05/05/2018 Elsevier Patient Education  2020 Elsevier Inc.  

## 2019-06-02 NOTE — Progress Notes (Signed)
Kendra Rodriguez 53 y.o.   Chief Complaint  Patient presents with  . Eye Pain    and ear pain - LEFT x 3 mo  . Knee Pain    LEFT x 3 weeks    HISTORY OF PRESENT ILLNESS: This is a 53 y.o. female here to establish care.  First visit to this clinic.  Past medical history includes spinal stenosis with chronic low back pain, prediabetes, high cholesterol, GERD.  Non-smoker and no EtOH user.  Has several complaints for the past 3 weeks.  Complaining of right sinus pain with radiation to right ear and right nose along with some retro-orbital discomfort and diffuse joint pains.  No other significant symptoms.  Denies fever or chills.  HPI   Prior to Admission medications   Medication Sig Start Date End Date Taking? Authorizing Provider  atorvastatin (LIPITOR) 40 MG tablet Take 1 tablet (40 mg total) by mouth daily. 05/31/18  Yes Clent Demark, PA-C  dexlansoprazole (DEXILANT) 60 MG capsule Take 60 mg by mouth every morning.    Yes [provider]  fluticasone (FLONASE) 50 MCG/ACT nasal spray Place 2 sprays into both nostrils daily. 02/20/19  Yes Hassell Done, Mary-Margaret, FNP  metFORMIN (GLUCOPHAGE) 850 MG tablet TAKE 1 TABLET BY MOUTH TWICE DAILY WITH THE MORNING AND EVENING MEAL 08/26/18  Yes Clent Demark, PA-C  diclofenac sodium (VOLTAREN) 1 % GEL Apply 4 g topically 4 (four) times daily. 11/18/18   Jessy Oto, MD  levocetirizine (XYZAL) 5 MG tablet Take 1 tablet (5 mg total) by mouth every evening. 03/31/19   Brunetta Jeans, PA-C  sucralfate (CARAFATE) 1 GM/10ML suspension Take 1 g by mouth 4 (four) times daily -  with meals and at bedtime.    [provider]  topiramate (TOPAMAX) 25 MG tablet Take 1 tablet (25 mg total) by mouth 2 (two) times daily. 06/24/18   Clent Demark, PA-C    Allergies  Allergen Reactions  . Other     Patient Active Problem List   Diagnosis Date Noted  . Prediabetes 10/17/2015  . Hypertriglyceridemia 10/17/2015  . Spinal  stenosis, lumbar region, with neurogenic claudication 10/24/2014    Past Medical History:  Diagnosis Date  . Acute sinusitis   . Allergy   . Anxiety   . Depression    NO MEDICATIONS - DOING OK NOW  . Dizziness   . Ear ache    RIGHT --HX OF MULTIPLE EAR INFECTIONS AS A CHILD   . GERD (gastroesophageal reflux disease)   . Headache(784.0)    MIGRAINES  . Leg pain, left    with swelling  . Lumbar stenosis    PAIN BACK AND LEFT LEG AND NUMBNESS LEG AND FOOT  . Peripheral vascular disease (HCC)    HX OF TREATMENT FOR VARICOSE VEINS RIGHT LEG  . Thyroid disease    TOLD BLOOD LEVELS SLIGHTLY ABNORMAL - BUT NOT REQUIRED MEDICATION    Past Surgical History:  Procedure Laterality Date  . ABDOMINAL HYSTERECTOMY    . bladder lift     AT SAME TIME OF HYSTERECTOMY  . CHOLECYSTECTOMY    . LUMBAR LAMINECTOMY/DECOMPRESSION MICRODISCECTOMY Left 10/24/2014   Procedure: COMPLETE LUMBAR DECOMPRESSION L4-L5 CENTRAL AND MICRODISCECTOMY L4-L5 LEFT;  Surgeon: Tobi Bastos, MD;  Location: [facility];  Service: Orthopedics;  Laterality: Left;    Social History   Socioeconomic History  . Marital status: Married    Spouse name: Not on file  . Number of children: Not  on file  . Years of education: Not on file  . Highest education level: Not on file  Occupational History  . Not on file  Social Needs  . Financial resource strain: Not on file  . Food insecurity    Worry: Not on file    Inability: Not on file  . Transportation needs    Medical: Not on file    Non-medical: Not on file  Tobacco Use  . Smoking status: Never Smoker  . Smokeless tobacco: Never Used  Substance and Sexual Activity  . Alcohol use: No  . Drug use: No  . Sexual activity: Yes  Lifestyle  . Physical activity    Days per week: Not on file    Minutes per session: Not on file  . Stress: Not on file  Relationships  . Social Musicianconnections    Talks on phone: Not on file    Gets together: Not on file    Attends  religious service: Not on file    Active member of club or organization: Not on file    Attends meetings of clubs or organizations: Not on file    Relationship status: Not on file  . Intimate partner violence    Fear of current or ex partner: Not on file    Emotionally abused: Not on file    Physically abused: Not on file    Forced sexual activity: Not on file  Other Topics Concern  . Not on file  Social History Narrative  . Not on file    Family History  Problem Relation Age of Onset  . Hypertension Mother   . Arthritis Mother   . Diabetes Mother      Review of Systems  Constitutional: Negative.  Negative for chills and fever.  HENT: Positive for congestion and sinus pain. Negative for nosebleeds and sore throat.   Eyes: Negative.   Respiratory: Negative.  Negative for cough and shortness of breath.   Cardiovascular: Negative.  Negative for chest pain and palpitations.  Gastrointestinal: Negative.  Negative for abdominal pain, diarrhea, nausea and vomiting.  Genitourinary: Negative.  Negative for dysuria.  Musculoskeletal: Positive for joint pain.  Skin: Negative.  Negative for rash.  Neurological: Negative.  Negative for dizziness, sensory change, speech change, focal weakness and headaches.  Endo/Heme/Allergies: Negative.   All other systems reviewed and are negative.   Today's Vitals   06/02/19 1323  BP: (!) 144/80  Pulse: 85  Resp: 16  Temp: 98.6 F (37 C)  TempSrc: Oral  SpO2: 98%  Weight: 194 lb 12.8 oz (88.4 kg)   Body mass index is 35.63 kg/m.  Physical Exam Vitals signs reviewed.  HENT:     Head: Normocephalic and atraumatic.     Right Ear: Tympanic membrane, ear canal and external ear normal.     Left Ear: Tympanic membrane, ear canal and external ear normal.     Nose:     Right Sinus: Maxillary sinus tenderness and frontal sinus tenderness present.     Left Sinus: Frontal sinus tenderness present. No maxillary sinus tenderness.      Mouth/Throat:     Mouth: Mucous membranes are moist.     Pharynx: Oropharynx is clear.  Eyes:     Extraocular Movements: Extraocular movements intact.     Conjunctiva/sclera: Conjunctivae normal.     Pupils: Pupils are equal, round, and reactive to light.  Neck:     Musculoskeletal: Normal range of motion and neck supple.  Cardiovascular:  Rate and Rhythm: Normal rate and regular rhythm.     Pulses: Normal pulses.     Heart sounds: Normal heart sounds.  Pulmonary:     Effort: Pulmonary effort is normal.     Breath sounds: Normal breath sounds.  Musculoskeletal: Normal range of motion.  Lymphadenopathy:     Cervical: No cervical adenopathy.  Skin:    General: Skin is warm and dry.     Capillary Refill: Capillary refill takes less than 2 seconds.  Neurological:     General: No focal deficit present.     Mental Status: She is alert and oriented to person, place, and time.  Psychiatric:        Mood and Affect: Mood normal.        Behavior: Behavior normal.    A total of 30 minutes was spent in the room with the patient, greater than 50% of which was in counseling/coordination of care regarding chronic medical problems and acute problem, treatment, medications and side effects, review of present medications, review of medical history, differential diagnosis, prognosis, and need for follow-up.   ASSESSMENT & PLAN: Kendra Rodriguez was seen today for eye pain, knee pain and medication refill.  Diagnoses and all orders for this visit:  Acute non-recurrent frontal sinusitis -     amoxicillin-clavulanate (AUGMENTIN) 875-125 MG tablet; Take 1 tablet by mouth 2 (two) times daily for 7 days.  Elevated lipoprotein(a) -     atorvastatin (LIPITOR) 40 MG tablet; Take 1 tablet (40 mg total) by mouth daily.  Prediabetes -     metFORMIN (GLUCOPHAGE) 850 MG tablet; Take 1 tablet (850 mg total) by mouth 2 (two) times daily with a meal.  Gastroesophageal reflux disease without esophagitis -      dexlansoprazole (DEXILANT) 60 MG capsule; Take 1 capsule (60 mg total) by mouth every morning.  Sinus pressure  Encounter to establish care    Patient Instructions       If you have lab work done today you will be contacted with your lab results within the next 2 weeks.  If you have not heard from us then please contact us. The fastest way to get your results is to register for My Chart.   IF you received an x-ray today, you will receive an invoice [facility]. Please contact [facility] at [phone] with questions or concerns regarding your invoice.   IF you received labwork today, you will receive an invoice from [facility]. Please contact [facility] at [phone] with questions or concerns regarding your invoice.   Our billing staff will not be able to assist you with questions regarding bills from these companies.  You will be contacted with the lab results as soon as they are available. The fastest way to get your results is to activate your My Chart account. Instructions are located on the last page of this paperwork. If you have not heard from us regarding the results in 2 weeks, please contact this office.     Sinusitis, en adultos Sinusitis, Adult La sinusitis es el dolor y la hinchazn (inflamacin) de los senos paranasales. Los senos paranasales son espacios vacos en los huesos alrededor del rostro. Estos se encuentran en los siguientes lugares:  Alrededor de los ojos.  En la mitad de la frente.  Detrs de Architectural technologistla nariz.  En los pmulos. Los senos paranasales y las fosas nasales estn cubiertos de un lquido llamado mucosidad. La mucosidad drena a travs de los senos paranasales. La hinchazn puede atrapar Freescale Semiconductormucosidad  en los senos paranasales. Esto permite que se desarrollen grmenes (bacterias, virus u hongos), lo que produce infecciones. La [employer], la causa de esta afeccin es un virus. Cules son las causas? Las causas de esta  afeccin son:  [place].  Asma.  Grmenes.  Objetos que obstruyen la nariz o los senos paranasales.  Crecimientos en el interior de la nariz (plipos nasales).  Sustancias qumicas o irritantes que estn presentes en el aire.  Hongos (poco frecuente). Qu incrementa el riesgo? Es ms probable que contraiga esta afeccin si:  Tiene debilitado el sistema de defensa del organismo (sistema inmunitario).  Nada o bucea mucho.  [place] aerosoles nasales en exceso.  Fuma. Cules son los signos o los sntomas? Los principales sntomas de esta afeccin son dolor y sensacin de presin alrededor de los senos paranasales. Otros sntomas pueden incluir los siguientes:  Nariz tapada (congestin nasal).  Goteo nasal (drenaje).  Hinchazn y calor en los senos paranasales.  Dolor de [place].  Dolor dental.  Tos que puede empeorar por la noche.  Mucosidad que se acumula en la garganta o la parte posterior de la nariz (goteo posnasal).  Incapacidad de sentir olores y sabores.  Estar muy cansado (fatiga).  Grant Ruts.  Dolor de Advertising copywriter.  Mal aliento. Cmo se diagnostica? Esta afeccin se diagnostica en funcin de lo siguiente:  Sus sntomas.  Sus antecedentes mdicos.  Un examen fsico.  Pruebas para averiguar si la afeccin es de corta duracin [place]) o de larga duracin (crnica). El mdico puede: ? Revisarle la nariz para detectar crecimientos (plipos). ? Revisarle los senos paranasales con una herramienta que tiene una luz (endoscopio). ? Revisar si tiene alergias o grmenes. ? Hacerle pruebas de diagnstico por imgenes, como una resonancia magntica (RM) o exploracin por tomografa computarizada (TC). Cmo se trata? El tratamiento de esta afeccin depende de la causa y si es de corta o de larga duracin.  Si la causa es un virus, los sntomas deberan desaparecer solos en el trmino de 10das. Pueden darle medicamentos para aliviar los sntomas. Entre ellos, se  incluyen los siguientes: ? Medicamentos para Actuary tejido inflamado de la Clinical cytogeneticist. ? Medicamentos para tratar alergias (antihistamnicos). ? Un aerosol para tratar la hinchazn de las fosas nasales. ? Enjuagues que ayudan a eliminar la mucosidad espesa de la nariz (lavados con solucin salina nasal).  Si la causa es una bacteria, es posible que el mdico espere para averiguar si usted mejora sin TEFL teacher. Es posible que le den un antibitico si usted tiene: ? Una infeccin grave. ? El sistema de defensa del organismo debilitado.  Si la causa son crecimientos en la Darene Lamer, es posible que necesite [place]. Siga estas indicaciones en su casa: [employer], use o aplique los medicamentos de venta libre y los recetados solamente como se lo haya indicado el mdico. Estos pueden incluir aerosoles nasales.  Si le recetaron un antibitico, tmelo como se lo haya indicado el mdico. No deje de tomar los antibiticos aunque comience a Actor. Hidrtese y humidifique los ambientes   Beba suficiente agua para Pharmacologist el pis (la orina) de color amarillo plido.  Use un humidificador de vapor fro para mantener la humedad de su hogar por encima del 50%.  Inhale vapor durante 10a , de 3a 4veces al da, o como se lo haya indicado el mdico. Puede hacer esto en el bao con el vapor del agua caliente de la ducha.  Trate de no exponerse al aire fro o  seco. Reposo  Descanse todo lo posible.  Duerma con la cabeza levantada (elevada).  Asegrese de dormir lo suficiente cada noche. Indicaciones generales   Pngase un pao caliente y hmedo en el rostro 3 a 4 veces al da, o con la frecuencia indicada por el mdico. Esto ayuda a Multimedia programmercalmar las molestias.  Lvese las manos frecuentemente con agua y [place]. Use un desinfectante para manos si no dispone de [place] y [place].  No fume. Evite estar cerca de personas que fuman (fumador pasivo).  Concurra a todas las visitas  de [address]se[address] como se lo haya indicado el mdico. Esto es importante. Comunquese con un mdico si:  Tiene fiebre.  Sus sntomas empeoran.  Los sntomas no mejoran en el perodo de 10das. Solicite ayuda inmediatamente si:  Tiene un dolor de cabeza muy intenso.  No puede dejar de vomitar.  Tiene dolor muy intenso o hinchazn en la zona del rostro o los ojos.  Tiene dificultad para ver.  Se siente confundido.  Tiene el cuello rgido.  Tiene dificultad para respirar. Resumen  La sinusitis es la hinchazn de los senos paranasales. Los senos paranasales son espacios vacos en los huesos alrededor del rostro.  La causa de esta afeccin es la inflamacin o hinchazn de los tejidos que estn en el interior de la [place]. Esto hace que los grmenes queden atrapados. Los grmenes pueden provocar infecciones.  Si le recetaron un antibitico, tmelo como se lo haya indicado el mdico. No deje de tomarlo aunque comience a sentirse mejor.  Concurra a todas las visitas de [address]gu[address] como se lo haya indicado el mdico. Esto es importante. Esta informacin no tiene Theme park managercomo fin reemplazar el consejo del mdico. Asegrese de hacerle al mdico cualquier pregunta que tenga. Document Released: 07/14/2012 Document Revised: 05/05/2018 Document Reviewed: 05/05/2018 Elsevier Patient Education  2020 Elsevier Inc.      Edwina BarthMiguel Yides Saidi, MD [facility]

## 2019-06-17 ENCOUNTER — Other Ambulatory Visit: Payer: Self-pay

## 2019-06-17 DIAGNOSIS — Z20822 Contact with and (suspected) exposure to covid-19: Secondary | ICD-10-CM

## 2019-06-19 LAB — NOVEL CORONAVIRUS, NAA: SARS-CoV-2, NAA: NOT DETECTED

## 2019-06-30 ENCOUNTER — Other Ambulatory Visit: Payer: Self-pay | Admitting: *Deleted

## 2019-06-30 ENCOUNTER — Other Ambulatory Visit: Payer: Self-pay

## 2019-06-30 ENCOUNTER — Encounter: Payer: Self-pay | Admitting: Emergency Medicine

## 2019-06-30 ENCOUNTER — Ambulatory Visit: Payer: BC Managed Care – PPO | Admitting: Emergency Medicine

## 2019-06-30 VITALS — BP 128/74 | HR 81 | Temp 98.4°F | Resp 16 | Ht 62.0 in | Wt 194.8 lb

## 2019-06-30 DIAGNOSIS — K2100 Gastro-esophageal reflux disease with esophagitis, without bleeding: Secondary | ICD-10-CM

## 2019-06-30 DIAGNOSIS — E785 Hyperlipidemia, unspecified: Secondary | ICD-10-CM | POA: Diagnosis not present

## 2019-06-30 DIAGNOSIS — R079 Chest pain, unspecified: Secondary | ICD-10-CM | POA: Diagnosis not present

## 2019-06-30 DIAGNOSIS — J309 Allergic rhinitis, unspecified: Secondary | ICD-10-CM | POA: Insufficient documentation

## 2019-06-30 DIAGNOSIS — R7303 Prediabetes: Secondary | ICD-10-CM

## 2019-06-30 MED ORDER — AZELASTINE-FLUTICASONE 137-50 MCG/ACT NA SUSP: 1.0000 | Freq: Two times a day (BID) | NASAL | 2 refills | Status: DC

## 2019-06-30 MED ORDER — FAMOTIDINE 40 MG PO TABS: 40.0000 mg | ORAL_TABLET | Freq: Every day | ORAL | 1 refills | Status: DC

## 2019-06-30 MED ORDER — METFORMIN HCL 850 MG PO TABS: 850.0000 mg | ORAL_TABLET | Freq: Two times a day (BID) | ORAL | 3 refills | Status: DC

## 2019-06-30 NOTE — Patient Instructions (Addendum)
   If you have lab work done today you will be contacted with your lab results within the next 2 weeks.  If you have not heard from us then please contact us. The fastest way to get your results is to register for My Chart.   IF you received an x-ray today, you will receive an invoice from [facility]. Please contact [facility] at [phone] with questions or concerns regarding your invoice.   IF you received labwork today, you will receive an invoice from [facility]. Please contact [facility] at [phone] with questions or concerns regarding your invoice.   Our billing staff will not be able to assist you with questions regarding bills from these companies.  You will be contacted with the lab results as soon as they are available. The fastest way to get your results is to activate your My Chart account. Instructions are located on the last page of this paperwork. If you have not heard from us regarding the results in 2 weeks, please contact this office.     Opciones de alimentos para pacientes adultos con enfermedad de reflujo gastroesofgico Food Choices for Gastroesophageal Reflux Disease, Adult Si tiene enfermedad de reflujo gastroesofgico (ERGE), los alimentos que consume y los hbitos de alimentacin son muy importantes. Elegir los alimentos adecuados puede ayudar a aliviar las molestias. Piense en consultar a un especialista en nutricin (nutricionista) para que lo ayude a hacer buenas elecciones. Consejos para seguir este plan  Comidas  Elija alimentos saludables con bajo contenido de grasa, como frutas, verduras, cereales integrales, productos lcteos descremados y carne magra de vaca, de pescado y de ave.  Haga comidas pequeas durante el da en lugar de 3 comidas abundantes. Coma lentamente y en un lugar donde est distendido. Evite agacharse o recostarse hasta 2 o 3horas despus de haber comido.  Evite comer 2 a 3horas antes de ir a acostarse.  Evite  beber grandes cantidades de lquidos con las comidas.  Evite frer los alimentos a la hora de la coccin. Puede hornear, grillar o asar a la parrilla.  Evite o limite la cantidad de: ? Chocolate. ? Menta y mentol. ? Alcohol. ? Pimienta. ? Caf negro y descafeinado. ? T negro y descafeinado. ? Bebidas con gas (gaseosas). ? Bebidas energizantes y refrescos que contengan cafena.  Limite los alimentos con alto contenido de grasas, por ejemplo: ? Carnes grasas o alimentos fritos. ? Leche entera, crema, manteca o helado. ? Nueces y mantequillas de frutos secos. ? Pastelera, donas y dulces hechos con manteca o margarina.  Evite los alimentos que le ocasionen sntomas. Estos pueden ser distintos para cada persona. Los alimentos que suelen causan sntomas son los siguientes: ? Tomates. ? Naranjas, limones y limas. ? Pimientos. ? Comidas condimentadas. ? [place]ebolla y ajo. ? Vinagre. Estilo de vida  Mantenga un peso saludable. Pregntele a su mdico cul es el peso saludable para usted. Si necesita perder peso, hable con su mdico para hacerlo de manera segura.  Realice actividad fsica durante, al menos, 30 minutos 5 das por semana o ms, o segn lo indicado por su mdico.  Use ropa suelta.  No fume. Si necesita ayuda para dejar de fumar, consulte al mdico.  Duerma con la cabecera de la cama ms elevada que los pies. Use una cua debajo del colchn o bloques debajo del armazn de la cama para mantener la cabecera de la cama elevada. Resumen  Si tiene enfermedad de reflujo gastroesofgico (ERGE), las elecciones de alimentos y el estilo   de vida son muy importantes para ayudar a Merchant navy officeraliviar los sntomas.  Haga comidas pequeas durante Glass blower/designerel da en lugar de 3 comidas abundantes. Coma lentamente y en un lugar donde est distendido.  Limite los alimentos con alto contenido graso como la carne grasa o los alimentos fritos.  Evite agacharse o recostarse hasta 2 o 3horas despus de haber  comido.  Evite la menta y Thunderbird Bayhierba buena, la cafena, el alcohol y el chocolate. Esta informacin no tiene Theme park managercomo fin reemplazar el consejo del mdico. Asegrese de hacerle al mdico cualquier pregunta que tenga. Document Released: 04/20/2012 Document Revised: 05/26/2017 Document Reviewed: 05/26/2017 Elsevier Patient Education  2020 [employer]  Diabetes mellitus y nutricin, en adultos Diabetes Mellitus and Nutrition, Adult Si sufre de diabetes (diabetes mellitus), es muy importante tener hbitos alimenticios saludables debido a que sus niveles de Psychologist, counsellingazcar en la sangre (glucosa) se ven afectados en gran medida por lo que come y bebe. [place] alimentos saludables en las cantidades Lawrenceadecuadas, aproximadamente a la Smith Internationalmisma hora todos los das, [place]lo ayudar a:  Scientist, physiologicalControlar la glucemia.  Disminuir el riesgo de sufrir una enfermedad cardaca.  Mejorar la presin arterial.  BaristaAlcanzar o mantener un peso saludable. Todas las personas que sufren de diabetes son diferentes y cada una tiene necesidades diferentes en cuanto a un plan de alimentacin. El mdico puede recomendarle que trabaje con un especialista en dietas y nutricin (nutricionista) para Tax adviserelaborar el mejor plan para usted. Su plan de alimentacin puede variar segn factores como:  Las caloras que necesita.  Los medicamentos que toma.  Su peso.  Sus niveles de glucemia, presin arterial y colesterol.  Su nivel de [place] and the Grenadinesactividad.  Otras afecciones que tenga, como enfermedades cardacas o renales. Cmo me afectan los carbohidratos? Los carbohidratos, o hidratos de carbono, afectan su nivel de glucemia ms que cualquier otro tipo de alimento. La ingesta de carbohidratos naturalmente aumenta la cantidad de CarMaxglucosa en la sangre. El recuento de carbohidratos es un mtodo destinado a Midwifellevar un registro de la cantidad de carbohidratos que se consumen. El recuento de carbohidratos es importante para Pharmacologistmantener la glucemia a un nivel saludable, especialmente si  utiliza insulina o toma determinados medicamentos por va oral para la diabetes. Es importante conocer la cantidad de carbohidratos que se pueden ingerir en cada comida sin correr Surveyor, mineralsningn riesgo. Esto es Government social research officerdiferente en cada persona. Su nutricionista puede ayudarlo a calcular la cantidad de carbohidratos que debe ingerir en cada comida y en cada refrigerio. Entre los alimentos que contienen carbohidratos, se incluyen:  Pan, cereal, arroz, pastas y galletas.  Papas y maz.  Guisantes, frijoles y lentejas.  Leche y Dentistyogur.  Nils PyleFrutas y Sloveniajugo.  Postres, como pasteles, galletas, helado y caramelos. Cmo me afecta el alcohol? El alcohol puede provocar disminuciones sbitas de la glucemia (hipoglucemia), especialmente si utiliza insulina o toma determinados medicamentos por va oral para la diabetes. La hipoglucemia es una afeccin potencialmente mortal. Los sntomas de la hipoglucemia (somnolencia, mareos y confusin) son similares a los sntomas de haber consumido demasiado alcohol. Si el mdico afirma que el alcohol es seguro para usted, Mainesiga estas pautas:  Limite el consumo de alcohol a no ms de 1medida por da si es mujer y no est Yermoembarazada, y a 2medidas si es hombre. Una medida equivale a 12oz (355ml) de cerveza, 5oz (148ml) de vino o 1oz (44ml) de bebidas alcohlicas de alta graduacin.  No beba con el estmago vaco.  Mantngase hidratado bebiendo agua, refrescos dietticos o t helado sin azcar.  Tenga en  cuenta que los refrescos comunes, los jugos y otras bebida para Engineer, manufacturing pueden contener Product/process development scientist y se deben contar como carbohidratos. Cules son algunos consejos para seguir este plan?  Leer las etiquetas de los alimentos  Comience por leer el tamao de la porcin en la "Informacin nutricional" en las etiquetas de los alimentos envasados y las bebidas. La cantidad de caloras, carbohidratos, grasas y otros nutrientes mencionados en la etiqueta se basan en una porcin del  alimento. Muchos alimentos contienen ms de una porcin por envase.  Verifique la cantidad total de gramos (g) de carbohidratos totales en una porcin. Puede calcular la cantidad de porciones de carbohidratos al dividir el total de carbohidratos por 15. Por ejemplo, si un alimento tiene un total de 30g de carbohidratos, equivale a 2 porciones de carbohidratos.  Verifique la cantidad de gramos (g) de grasas saturadas y grasas trans en una porcin. Escoja alimentos que no contengan grasa o que tengan un bajo contenido.  Verifique la cantidad de miligramos (mg) de sal (sodio) en una porcin. La [employer] de las personas deben limitar la ingesta de sodio total a menos de 2300mg  por Futures trader.  Siempre consulte la informacin nutricional de los alimentos etiquetados como "con bajo contenido de grasa" o "sin grasa". Estos alimentos pueden tener un mayor contenido de International aid/development worker agregada o carbohidratos refinados, y deben evitarse.  Hable con su nutricionista para identificar sus objetivos diarios en cuanto a los nutrientes mencionados en la etiqueta. Al ir de compras  Evite comprar alimentos procesados, enlatados o precocinados. Estos alimentos tienden a Counselling psychologist mayor cantidad de [place], sodio y azcar agregada.  Compre en la zona exterior de la tienda de comestibles. Esta zona incluye frutas y verduras frescas, granos a granel, carnes frescas y productos lcteos frescos. Al cocinar  Utilice mtodos de coccin a baja temperatura, como hornear, en lugar de mtodos de coccin a alta temperatura, como frer en abundante aceite.  Cocine con aceites saludables, como el aceite de [place], canola o [place].  Evite cocinar con manteca, crema o carnes con alto contenido de grasa. Planificacin de las comidas  Coma las comidas y los refrigerios regularmente, preferentemente a la misma hora todos [place]. Evite pasar largos perodos de tiempo sin comer.  Consuma alimentos ricos en fibra, como frutas frescas,  verduras, frijoles y cereales integrales. Consulte a su nutricionista sobre cuntas porciones de carbohidratos puede consumir en cada comida.  Consuma entre 4 y 6 onzas (oz) de protenas magras por da, como carnes [place], pollo, pescado, huevos o tofu. Una onza de protena magra equivale a: ? 1 onza de carne, pollo o pescado. ? 1huevo. ?  taza de tofu.  Coma algunos alimentos por da que contengan grasas saludables, como aguacates, frutos secos, semillas y pescado. Estilo de vida  Controle su nivel de glucemia con regularidad.  Haga actividad fsica habitualmente como se lo haya indicado el mdico. Esto puede incluir lo siguiente: ? semanales de ejercicio de intensidad moderada o alta. Esto podra incluir caminatas dinmicas, ciclismo o gimnasia acutica. ? Realizar ejercicios de elongacin y de fortalecimiento, como yoga o levantamiento de pesas, por lo menos 2veces por semana.  Tome los Monsanto Company se lo haya indicado el mdico.  No consuma ningn producto que contenga nicotina o tabaco, como cigarrillos y Administrator, Civil Service. Si necesita ayuda para dejar de fumar, consulte al CIGNA con un asesor o instructor en diabetes para identificar estrategias para controlar el estrs y cualquier desafo emocional y social. Preguntas para  hacerle al mdico  Es necesario que consulte a Radio broadcast assistant en el cuidado de la diabetes?  Es necesario que me rena con un nutricionista?  A qu nmero puedo llamar si tengo preguntas?  Cules son los mejores momentos para controlar la glucemia? Dnde encontrar ms informacin:  Asociacin Estadounidense de la Diabetes (American Diabetes Association): diabetes.org  Academia de Nutricin y Information systems manager (Academy of Nutrition and Dietetics): www.eatright.[place] Diabetes y las Enfermedades Digestivas y [facility] of Diabetes and Digestive and Kidney Diseases, NIH):  DesMoinesFuneral.dk Resumen  Un plan de alimentacin saludable lo ayudar a Aeronautical engineer glucemia y Theatre manager un estilo de vida saludable.  Trabajar con un especialista en dietas y nutricin (nutricionista) puede ayudarlo a Insurance claims handler de alimentacin para usted.  Tenga en cuenta que los carbohidratos (hidratos de carbono) y el alcohol tienen efectos inmediatos en sus niveles de glucemia. Es importante contar los carbohidratos que ingiere y consumir alcohol con prudencia. Esta informacin no tiene Marine scientist el consejo del mdico. Asegrese de hacerle al mdico cualquier pregunta que tenga. Document Released: 01/27/2008 Document Revised: 06/30/2017 Document Reviewed: 02/09/2017 Elsevier Patient Education  [place] de pecho inespecfico Nonspecific Chest Pain El dolor de pecho puede deberse a muchas afecciones diferentes. Algunas causas del dolor de pecho pueden ser potencialmente mortales. Estas requieren tratamiento inmediato. Algunas causas grave de dolor en el pecho son las siguientes:  Infarto de miocardio.  Un desgarro en el vaso sanguneo principal del cuerpo.  Enrojecimiento e hinchazn (inflamacin) alrededor del corazn.  Un cogulo de [employer]. Otras causas de dolor en el pecho pueden no ser tan graves. Estos incluyen los siguientes:  Merchant navy officer.  Ansiedad o estrs.  Dao en [place].  Infecciones pulmonares. El dolor de pecho puede provocar las siguientes sensaciones:  Dolor o molestias en el pecho.  Dolor opresivo, continuo o constrictivo.  Ardor u hormigueo.  Dolor sordo o intenso que empeora al [employer], toser o inhalar profundamente.  Dolor o [employer] tambin se sienten en la espalda, el cuello, la [place], el hombro o el brazo, o dolor que se extiende a cualquiera de estas zonas. Es difcil saber si la causa del dolor es algo grave o algo que no es tan grave. Por lo tanto,  es importante que consulte al mdico inmediatamente si tiene Tourist information centre manager. Siga estas indicaciones en su casa: Medicamentos  Delphi de venta libre y los recetados solamente como se lo haya indicado el mdico.  Si le recetaron un antibitico, tmelo como se lo haya indicado el mdico. No deje de tomar los antibiticos aunque comience a Sports administrator. Estilo de vida   Haga reposo como se lo haya indicado el mdico.  No consuma ningn producto que contenga nicotina o tabaco, como cigarrillos, cigarrillos electrnicos y tabaco de Higher education careers adviser. Si necesita ayuda para dejar de fumar, consulte al mdico.  No beba alcohol.  Haga cambios en su estilo de vida segn las indicaciones del mdico. Estos pueden incluir lo siguiente: ? Practicar actividad fsica con regularidad. Pregntele al mdico qu actividades son seguras para usted. ? Seguir una dieta cardiosaludable. Un especialista en dietas y alimentacin (nutricionista) puede ayudarlo a que haga elecciones saludables. ? Mantener un peso saludable. ? Tratar la diabetes o la presin arterial alta, si es necesario. ? Reducir el estrs. Las [employer] yoga y las tcnicas de relajacin pueden ayudar. Indicaciones generales  Est atento a cualquier cambio en los sntomas. Informe a su mdico si presenta algn cambio en los sntomas o si aparecen sntomas nuevos.  Evite las SUPERVALU INCactividades que le causen dolor de [place]cho.  Concurra a todas las visitas de [address]wayseguimiento como se lo haya indicado el mdico. Esto es importante. Es posible que tenga que someterse a ms estudios si el dolor de pecho no desaparece. Comunquese con un mdico si:  El dolor de pecho no desaparece.  Se siente deprimido.  Tiene fiebre. Solicite ayuda inmediatamente si:  El dolor en el pecho es ms intenso.  Tiene tos que empeora o tose con Tatamysangre.  Tiene dolor muy intenso en el vientre (abdomen).  Pierde el conocimiento (se desmaya).  Tiene  cualquiera de estos sntomas sin ninguna causa clara: ? DentistMalestar repentino en el pecho. ? Molestias repentinas en los brazos, la espalda, el cuello o la Golfmandbula.  Le falta el aire en cualquier momento.  Comienza a sudar de Hondurasmanera repentina o la piel se le humedece.  Siente malestar estomacal (nuseas).  Vomita.  Se siente repentinamente mareado o se desmaya.  Se siente muy dbil o cansado.  El corazn comienza a latirle rpidamente o parece que se saltea latidos. Estos sntomas pueden Customer service managerindicar una emergencia. No espere a ver si los sntomas desaparecen. Solicite atencin mdica de inmediato. Comunquese con el servicio de emergencias de su localidad (911 en los [place]). No conduzca por sus propios medios OfficeMax Incorporatedhasta el hospital. Resumen  El dolor de pecho puede deberse a muchas afecciones diferentes. La causa puede ser grave y requerir tratamiento de inmediato. Si tiene dolor de pecho, consulte al mdico de inmediato.  Siga las indicaciones del mdico para tomar los medicamentos y Radio producerhacer cambios en su estilo de vida.  Concurra a todas las visitas de seguimiento como se lo haya indicado el mdico. Esto incluye las visitas para realizarle otros estudios si el dolor de pecho no desaparece.  Asegrese de The Northwestern Mutualconocer los signos que indican que su afeccin ha empeorado. Obtenga ayuda de inmediato si tiene esos sntomas. Esta informacin no tiene Theme park managercomo fin reemplazar el consejo del mdico. Asegrese de hacerle al mdico cualquier pregunta que tenga. Document Released: 01/16/2009 Document Revised: 06/21/2018 Document Reviewed: 06/21/2018 Elsevier Patient Education  2020 Arvin[employer]Elsevier Inc.

## 2019-06-30 NOTE — Progress Notes (Signed)
Kendra Rodriguez 53 y.o.   Chief Complaint  Patient presents with  . Sinus Problem    follow up 4 weeks fro the last 06/02/2019  . Medication Refill    Metformin    HISTORY OF PRESENT ILLNESS: This is a 53 y.o. female complaining of several problems: 1.  Chronic sinus congestion with occasional frontal headaches.  Has a history of seasonal allergies.  Cleans almost daily at home and daughters at home using many different cleaning solutions.  Exposed to fumes. 2.  History of prediabetes/diabetes: Has been on metformin for 2 to 3 years.  Takes it once a day. 3.  History of high cholesterol: Takes Lipitor 40 mg daily.  Fasting today.  We will do labs. 4.  History of GERD.  Sees Dr. Loreta Rodriguez, GI doctor.  Had recent upper and lower endoscopies.  Basically within normal limits.  One polyp on colonoscopy.  Has been on Dexilant for 5 years.  Gets occasional heartburn.  Complains of chronic "throat problems".  May be related to reflux disease. 5.  Atypical chest pain.  Complaining of pain-discomfort across the chest for the past several weeks.  Intermittent dull aches with some radiation into her neck.  Varies with physical activity.  Non-smoker.  No history of heart condition.  HPI   Prior to Admission medications   Medication Sig Start Date End Date Taking? Authorizing Provider  atorvastatin (LIPITOR) 40 MG tablet Take 1 tablet (40 mg total) by mouth daily. 06/02/19  Yes Kendra Rodriguez, Kendra Kempf, MD  dexlansoprazole (DEXILANT) 60 MG capsule Take 1 capsule (60 mg total) by mouth every morning. 06/02/19  Yes Kendra Rodriguez, Kendra Kempf, MD  fluticasone [facility]) 50 MCG/ACT nasal spray Place 2 sprays into both nostrils daily. 02/20/19  Yes Kendra Rodriguez, Mary-Margaret, FNP  metFORMIN (GLUCOPHAGE) 850 MG tablet Take 1 tablet (850 mg total) by mouth 2 (two) times daily with a meal. 06/02/19 08/31/19 Yes Kendra Rodriguez, Kendra Kempf, MD  diclofenac sodium (VOLTAREN) 1 % GEL Apply 4 g topically 4 (four) times daily. 11/18/18   Kendra Champagne, MD  levocetirizine (XYZAL) 5 MG tablet Take 1 tablet (5 mg total) by mouth every evening. 03/31/19   Kendra Merl, PA-C  sucralfate (CARAFATE) 1 GM/10ML suspension Take 1 g by mouth 4 (four) times daily -  with meals and at bedtime.    [provider]  topiramate (TOPAMAX) 25 MG tablet Take 1 tablet (25 mg total) by mouth 2 (two) times daily. 06/24/18   Kendra Specter, PA-C    Allergies  Allergen Reactions  . Other     Patient Active Problem List   Diagnosis Date Noted  . Prediabetes 10/17/2015  . Hypertriglyceridemia 10/17/2015  . Spinal stenosis, lumbar region, with neurogenic claudication 10/24/2014    Past Medical History:  Diagnosis Date  . Acute sinusitis   . Allergy   . Anxiety   . Depression    NO MEDICATIONS - DOING OK NOW  . Dizziness   . Ear ache    RIGHT --HX OF MULTIPLE EAR INFECTIONS AS A CHILD   . GERD (gastroesophageal reflux disease)   . Headache(784.0)    MIGRAINES  . Leg pain, left    with swelling  . Lumbar stenosis    PAIN BACK AND LEFT LEG AND NUMBNESS LEG AND FOOT  . Peripheral vascular disease (HCC)    HX OF TREATMENT FOR VARICOSE VEINS RIGHT LEG  . Thyroid disease    TOLD BLOOD LEVELS SLIGHTLY ABNORMAL - BUT NOT  REQUIRED MEDICATION    Past Surgical History:  Procedure Laterality Date  . ABDOMINAL HYSTERECTOMY    . bladder lift     AT SAME TIME OF HYSTERECTOMY  . CHOLECYSTECTOMY    . LUMBAR LAMINECTOMY/DECOMPRESSION MICRODISCECTOMY Left 10/24/2014   Procedure: COMPLETE LUMBAR DECOMPRESSION L4-L5 CENTRAL AND MICRODISCECTOMY L4-L5 LEFT;  Surgeon: Kendra Cones, MD;  Location: [facility];  Service: Orthopedics;  Laterality: Left;    Social History   Socioeconomic History  . Marital status: Married    Spouse name: Not on file  . Number of children: Not on file  . Years of education: Not on file  . Highest education level: Not on file  Occupational History  . Not on file  Social Needs  . Financial resource  strain: Not on file  . Food insecurity    Worry: Not on file    Inability: Not on file  . Transportation needs    Medical: Not on file    Non-medical: Not on file  Tobacco Use  . Smoking status: Never Smoker  . Smokeless tobacco: Never Used  Substance and Sexual Activity  . Alcohol use: No  . Drug use: No  . Sexual activity: Yes  Lifestyle  . Physical activity    Days per week: Not on file    Minutes per session: Not on file  . Stress: Not on file  Relationships  . Social Musician on phone: Not on file    Gets together: Not on file    Attends religious service: Not on file    Active member of club or organization: Not on file    Attends meetings of clubs or organizations: Not on file    Relationship status: Not on file  . Intimate partner violence    Fear of current or ex partner: Not on file    Emotionally abused: Not on file    Physically abused: Not on file    Forced sexual activity: Not on file  Other Topics Concern  . Not on file  Social History Narrative  . Not on file    Family History  Problem Relation Age of Onset  . Hypertension Mother   . Arthritis Mother   . Diabetes Mother   . Hyperlipidemia Mother      Review of Systems  Constitutional: Negative.  Negative for chills and fever.  HENT: Positive for congestion and sore throat.   Respiratory: Negative.  Negative for cough, shortness of breath and wheezing.   Cardiovascular: Positive for chest pain. Negative for palpitations.  Gastrointestinal: Positive for heartburn. Negative for abdominal pain, blood in stool, melena, nausea and vomiting.  Genitourinary: Negative.   Musculoskeletal: Negative.  Negative for myalgias and neck pain.  Skin: Negative.  Negative for rash.  Neurological: Positive for headaches.  All other systems reviewed and are negative.  Today's Vitals   06/30/19 0817  BP: 128/74  Pulse: 81  Resp: 16  Temp: 98.4 F (36.9 C)  TempSrc: Oral  SpO2: 96%  Weight: 194  lb 12.8 oz (88.4 kg)  Height: 5\' 2"  (1.575 m)   Body mass index is 35.63 kg/m.   Physical Exam Vitals signs reviewed.  Constitutional:      Appearance: Normal appearance.  HENT:     Head: Normocephalic.     Mouth/Throat:     Mouth: Mucous membranes are moist.     Pharynx: Oropharynx is clear.  Eyes:     Extraocular Movements: Extraocular movements intact.  Conjunctiva/sclera: Conjunctivae normal.     Pupils: Pupils are equal, round, and reactive to light.  Neck:     Musculoskeletal: Normal range of motion and neck supple.  Cardiovascular:     Rate and Rhythm: Normal rate and regular rhythm.     Heart sounds: Normal heart sounds.  Pulmonary:     Effort: Pulmonary effort is normal.     Breath sounds: Normal breath sounds.  Abdominal:     General: There is no distension.     Palpations: Abdomen is soft.     Tenderness: There is no abdominal tenderness.  Musculoskeletal: Normal range of motion.  Skin:    General: Skin is warm and dry.     Capillary Refill: Capillary refill takes less than 2 seconds.  Neurological:     General: No focal deficit present.     Mental Status: She is alert and oriented to person, place, and time.  Psychiatric:        Mood and Affect: Mood normal.        Behavior: Behavior normal.     EKG: Normal sinus rhythm with ventricular rate of 70 bpm.  No acute ischemic changes.  Normal EKG. ASSESSMENT & PLAN:  Kendra Rodriguez was seen today for sinus problem and medication refill. A total of 40 minutes was spent in the room with the patient, greater than 50% of which was in counseling/coordination of care regarding differential diagnosis including possibility of heart disease, need for cardiology evaluation, review of EKG, need for blood work, diet and nutrition regarding GERD, physical activity, medications and side effects, prognosis, and need for follow-up.  Diagnoses and all orders for this visit:  Nonspecific chest pain -     CBC with  Differential/Platelet -     Comprehensive metabolic panel -     EKG 12-Lead -     Ambulatory referral to Cardiology  Prediabetes -     Hemoglobin A1c  Dyslipidemia -     Lipid panel  GERD with esophagitis -     CBC with Differential/Platelet -     Comprehensive metabolic panel -     famotidine (PEPCID) 40 MG tablet; Take 1 tablet (40 mg total) by mouth at bedtime for 14 days.  Allergic rhinitis, unspecified seasonality, unspecified trigger -     Azelastine-Fluticasone 137-50 MCG/ACT SUSP; Place 1 spray into the nose 2 (two) times daily.   Patient Instructions      If you have lab work done today you will be contacted with your lab results within the next 2 weeks.  If you have not heard from [place] then please contact us. The fastest way to get your results is to register for My Chart.   IF you received an x-ray today, you will receive an invoice from [facility]. Please contact [facility] at [phone] with questions or concerns regarding your invoice.   IF you received labwork today, you will receive an invoice from [place]. Please contact [facility] at [phone] with questions or concerns regarding your invoice.   Our billing staff will not be able to assist you with questions regarding bills from these companies.  You will be contacted with the lab results as soon as they are available. The fastest way to get your results is to activate your My Chart account. Instructions are located on the last page of this paperwork. If you have not heard from [place] regarding the results in 2 weeks, please contact this office.    Opciones de alimentos para  pacientes adultos con enfermedad de reflujo gastroesofgico Food Choices for Gastroesophageal Reflux Disease, Adult Si tiene enfermedad de reflujo gastroesofgico (ERGE), los alimentos que consume y los hbitos de alimentacin son muy importantes. Elegir los alimentos adecuados puede ayudar a [employer].  Piense en consultar a un especialista en nutricin (nutricionista) para que lo ayude a Warehouse manager. Consejos para seguir [place] plan  Comidas Elija alimentos saludables con bajo contenido de grasa, como frutas, verduras, cereales integrales, productos lcteos descremados y carne [place] de [place], de pescado y de [place]. Haga comidas pequeas durante Psychiatrist de 3 comidas abundantes. Coma lentamente y en un lugar donde est distendido. Evite agacharse o recostarse hasta 2 o 3horas despus de haber comido. Evite comer 2 a 3horas antes de ir a acostarse. Evite beber grandes cantidades de lquidos con las comidas. Evite frer los alimentos a la hora de la coccin. Puede hornear, grillar o asar a la parrilla. Evite o limite la cantidad de: Chocolate. Menta y mentol. Alcohol. Pimienta. Caf negro y descafeinado. T negro y descafeinado. Bebidas con gas (gaseosas). Bebidas energizantes y refrescos que contengan cafena. Limite los alimentos con alto contenido de [place], por ejemplo: Carnes grasas o alimentos fritos. Leche entera, crema, manteca o helado. Nueces y [employer] de frutos secos. Pastelera, donas y dulces hechos con [place] o [place]. Evite los alimentos que le ocasionen sntomas. Estos pueden ser distintos para Higher education careers adviser. Los alimentos que suelen causan sntomas son los siguientes: Tomates. Naranjas, limones y limas. Pimientos. Comidas condimentadas. [place] y [place]. [place]. Estilo de vida Mantenga un peso saludable. Pregntele a su mdico cul es el peso saludable para usted. Si necesita perder peso, hable con su mdico para hacerlo de manera segura. Realice actividad fsica durante, al menos, 30 minutos 5 das por semana o ms, o segn lo indicado por su mdico. Use ropa suelta. No fume. Si necesita ayuda para dejar de fumar, consulte al mdico. Duerma con la cabecera de la cama ms elevada que los pies. Use una cua debajo del colchn o bloques debajo  del armazn de la cama para Theatre manager la cabecera de la cama elevada. Resumen Si tiene enfermedad de reflujo gastroesofgico (ERGE), las elecciones de alimentos y el [place] de vida son muy importantes para ayudar a Public house manager los sntomas. Haga comidas pequeas durante Psychiatrist de 3 comidas abundantes. Coma lentamente y en un lugar donde est distendido. Limite los alimentos con alto contenido graso como la carne grasa o los alimentos fritos. Evite agacharse o recostarse hasta 2 o 3horas despus de haber comido. Evite la menta y [place] buena, la cafena, el alcohol y el chocolate. Esta informacin no tiene Marine scientist el consejo del mdico. Asegrese de hacerle al mdico cualquier pregunta que tenga. Document Released: 04/20/2012 Document Revised: 05/26/2017 Document Reviewed: 05/26/2017 Elsevier Patient Education  [place].  Diabetes mellitus y nutricin, en adultos Diabetes Mellitus and Nutrition, Adult Si sufre de diabetes (diabetes mellitus), es muy importante tener hbitos alimenticios saludables debido a que sus niveles de Designer, television/film set sangre (glucosa) se ven afectados en gran medida por lo que come y bebe. [place] alimentos saludables en las cantidades [place], aproximadamente a la [employer], [place] ayudar a: Aeronautical engineer glucemia. Disminuir el riesgo de sufrir una enfermedad cardaca. Mejorar la presin arterial. Science writer o mantener un peso saludable. Todas las personas que sufren de diabetes son diferentes y cada una tiene necesidades diferentes en cuanto a un plan de alimentacin.  El mdico puede recomendarle que trabaje con un especialista en dietas y nutricin (nutricionista) para Tax adviser plan para usted. Su plan de alimentacin puede variar segn factores como: Las caloras que necesita. Los medicamentos que toma. Su peso. Sus niveles de glucemia, presin arterial y colesterol. Su nivel de [place]. Otras afecciones que tenga, como  enfermedades cardacas o renales. Cmo me afectan los carbohidratos? Los carbohidratos, o hidratos de carbono, afectan su nivel de glucemia ms que cualquier otro tipo de alimento. La ingesta de carbohidratos naturalmente aumenta la cantidad de [employer]. El recuento de carbohidratos es un mtodo destinado a Midwife un registro de la cantidad de carbohidratos que se consumen. El recuento de carbohidratos es importante para Pharmacologist la glucemia a un nivel saludable, especialmente si utiliza insulina o toma determinados medicamentos por va oral para la diabetes. Es importante conocer la cantidad de carbohidratos que se pueden ingerir en cada comida sin correr Surveyor, minerals. Esto es Government social research officer. Su nutricionista puede ayudarlo a calcular la cantidad de carbohidratos que debe ingerir en cada comida y en cada refrigerio. Entre los alimentos que contienen carbohidratos, se incluyen: Pan, cereal, arroz, pastas y galletas. Papas y maz. Guisantes, frijoles y lentejas. Leche y Dentist. Nils Pyle y [place]. Postres, como pasteles, galletas, helado y caramelos. Cmo me afecta el alcohol? El alcohol puede provocar disminuciones sbitas de la glucemia (hipoglucemia), especialmente si utiliza insulina o toma determinados medicamentos por va oral para la diabetes. La hipoglucemia es una afeccin potencialmente mortal. Los sntomas de la hipoglucemia (somnolencia, mareos y confusin) son similares a los sntomas de haber consumido demasiado alcohol. Si el mdico afirma que el alcohol es seguro para usted, [place] estas pautas: Limite el consumo de alcohol a no ms de por da si es mujer y no est [place], y a si es hombre. Una medida equivale a 12oz ( ) de cerveza, 5oz ( ) de vino o 1oz (44ml) de bebidas alcohlicas de alta graduacin. No beba con el estmago vaco. Mantngase hidratado bebiendo agua, refrescos dietticos o t helado sin azcar. Tenga en cuenta  que los refrescos comunes, los jugos y otras bebida para Engineer, manufacturing pueden contener mucha azcar y se deben contar como carbohidratos. Cules son algunos consejos para seguir este plan?  Leer las etiquetas de los alimentos Comience por leer el tamao de la porcin en la "Informacin nutricional" en las etiquetas de los alimentos envasados y las bebidas. La cantidad de caloras, carbohidratos, grasas y otros nutrientes mencionados en la etiqueta se basan en una porcin del alimento. Muchos alimentos contienen ms de una porcin por envase. Verifique la cantidad total de gramos (g) de carbohidratos totales en una porcin. Puede calcular la cantidad de porciones de carbohidratos al dividir el total de carbohidratos por 15. Por ejemplo, si un alimento tiene un total de 30g de carbohidratos, equivale a 2 porciones de carbohidratos. Verifique la cantidad de gramos (g) de grasas saturadas y grasas trans en una porcin. Escoja alimentos que no contengan grasa o que tengan un bajo contenido. Verifique la cantidad de miligramos (mg) de sal (sodio) en una porcin. La mayora de las personas deben limitar la ingesta de sodio total a menos de 2300mg  por Futures trader. Siempre consulte la informacin nutricional de los alimentos etiquetados como "con bajo contenido de grasa" o "sin grasa". Estos alimentos pueden tener un mayor contenido de International aid/development worker agregada o carbohidratos refinados, y deben evitarse. Hable con su nutricionista para identificar sus objetivos diarios en cuanto a los nutrientes mencionados  en la etiqueta. Al ir de compras Evite comprar alimentos procesados, enlatados o precocinados. Estos alimentos tienden a Counselling psychologisttener una mayor cantidad de [place]grasa, sodio y azcar agregada. Compre en la zona exterior de la tienda de comestibles. Esta zona incluye frutas y verduras frescas, granos a granel, carnes frescas y productos lcteos frescos. Al cocinar Utilice mtodos de coccin a baja temperatura, como hornear, en lugar de  mtodos de coccin a alta temperatura, como frer en abundante aceite. Cocine con aceites saludables, como el aceite de [place], canola o Seagrovegirasol. Evite cocinar con manteca, crema o carnes con alto contenido de grasa. Planificacin de las comidas Coma las comidas y los refrigerios regularmente, preferentemente a la misma hora todos Switzerlos das. Evite pasar largos perodos de tiempo sin comer. Consuma alimentos ricos en fibra, como frutas frescas, verduras, frijoles y cereales integrales. Consulte a su nutricionista sobre cuntas porciones de carbohidratos puede consumir en cada comida. Consuma entre 4 y 6 onzas (oz) de protenas magras por da, como carnes [place]magras, pollo, pescado, huevos o tofu. Una onza de protena magra equivale a: 1 onza de carne, pollo o pescado. 1huevo.  taza de tofu. Coma algunos alimentos por da que contengan grasas saludables, como aguacates, frutos secos, semillas y pescado. Estilo de vida Controle su nivel de glucemia con regularidad. Haga actividad fsica habitualmente como se lo haya indicado el mdico. Esto puede incluir lo siguiente: 150minutos semanales de ejercicio de intensidad moderada o alta. Esto podra incluir caminatas dinmicas, ciclismo o gimnasia acutica. Realizar ejercicios de elongacin y de fortalecimiento, como yoga o levantamiento de pesas, por lo menos 2veces por semana. Tome los Monsanto Companymedicamentos como se lo haya indicado el mdico. No consuma ningn producto que contenga nicotina o tabaco, como cigarrillos y Administrator, Civil Servicecigarrillos electrnicos. Si necesita ayuda para dejar de fumar, consulte al Autolivmdico. Trabaje con un asesor o instructor en diabetes para identificar estrategias para controlar el estrs y cualquier desafo emocional y social. Preguntas para hacerle al mdico Es necesario que consulte a IT trainerun instructor en el cuidado de la diabetes? Es necesario que me rena con un nutricionista? A qu nmero puedo llamar si tengo preguntas? Cules son los  mejores momentos para controlar la glucemia? Dnde encontrar ms informacin: Asociacin Estadounidense de la Diabetes (American Diabetes Association): diabetes.org Academia de Nutricin y Pension scheme managerDiettica (Academy of Nutrition and Dietetics): www.eatright.org The Krogernstituto Nacional de la Diabetes y las Enfermedades Digestivas y [facility](National Institute of Diabetes and Digestive and Kidney Diseases, NIH): CarFlippers.tnwww.niddk.nih.gov Resumen Un plan de alimentacin saludable lo ayudar a Scientist, physiologicalcontrolar la glucemia y Pharmacologistmantener un estilo de vida saludable. Trabajar con un especialista en dietas y nutricin (nutricionista) puede ayudarlo a Designer, television/film setelaborar el mejor plan de alimentacin para usted. Tenga en cuenta que los carbohidratos (hidratos de carbono) y el alcohol tienen efectos inmediatos en sus niveles de glucemia. Es importante contar los carbohidratos que ingiere y consumir alcohol con prudencia. Esta informacin no tiene Theme park managercomo fin reemplazar el consejo del mdico. Asegrese de hacerle al mdico cualquier pregunta que tenga. Document Released: 01/27/2008 Document Revised: 06/30/2017 Document Reviewed: 02/09/2017 Elsevier Patient Education  2020 ArvinMeritorElsevier Inc.  Dolor de pecho inespecfico Nonspecific Chest Pain El dolor de pecho puede deberse a muchas afecciones diferentes. Algunas causas del dolor de pecho pueden ser potencialmente mortales. Estas requieren tratamiento inmediato. Algunas causas grave de dolor en el pecho son las siguientes: Infarto de miocardio. Un desgarro en el vaso sanguneo principal del cuerpo. Enrojecimiento e hinchazn (inflamacin) alrededor del corazn. Un cogulo de ConAgra Foodssangre en los pulmones. Otras causas de  dolor en el pecho pueden no ser tan graves. Estos incluyen los siguientes: Merchant navy officer. Ansiedad o estrs. Dao en los huesos o msculos del corazn. Infecciones pulmonares. El dolor de pecho puede provocar las siguientes sensaciones: Dolor o molestias en el pecho. Dolor opresivo,  continuo o constrictivo. Ardor u hormigueo. Dolor sordo o intenso que empeora al [employer], toser o inhalar profundamente. Dolor o [employer] tambin se sienten en la espalda, el cuello, la [place], el hombro o el brazo, o dolor que se extiende a cualquiera de estas zonas. Es difcil saber si la causa del dolor es algo grave o algo que no es tan grave. Por lo tanto, es importante que consulte al mdico inmediatamente si tiene Journalist, newspaper. Siga estas indicaciones en su casa: Medicamentos Baxter International de venta libre y los recetados solamente como se lo haya indicado el mdico. Si le recetaron un antibitico, tmelo como se lo haya indicado el mdico. No deje de tomar los antibiticos aunque comience a Actor. Estilo de vida  Haga reposo como se lo haya indicado el mdico. No consuma ningn producto que contenga nicotina o tabaco, como cigarrillos, cigarrillos electrnicos y tabaco de Theatre manager. Si necesita ayuda para dejar de fumar, consulte al mdico. No beba alcohol. Haga cambios en su estilo de vida segn las indicaciones del mdico. Estos pueden incluir lo siguiente: Education administrator actividad fsica con regularidad. Pregntele al mdico qu actividades son seguras para usted. Seguir una dieta cardiosaludable. Un especialista en dietas y alimentacin (nutricionista) puede ayudarlo a que haga elecciones saludables. Mantener un peso saludable. Tratar la diabetes o la presin arterial alta, si es necesario. Reducir el estrs. Las [employer] Systems yoga y las tcnicas de relajacin pueden ayudar. Indicaciones generales Est atento a cualquier cambio en los sntomas. Informe a su mdico si presenta algn cambio en los sntomas o si aparecen sntomas nuevos. Evite las SUPER[employer] INC causen dolor de [place]. Concurra a todas las visitas de [address] se lo haya indicado el mdico. Esto es importante. Es posible que tenga que someterse a ms estudios si el dolor de pecho no  desaparece. Comunquese con un mdico si: El dolor de pecho no desaparece. Se siente deprimido. Tiene fiebre. Solicite ayuda inmediatamente si: El dolor en el pecho es ms intenso. Tiene tos que empeora o tose con [place]. Tiene dolor muy intenso en el vientre (abdomen). Pierde el conocimiento (se desmaya). Tiene cualquiera de estos sntomas sin ninguna causa clara: Malestar repentino en el pecho. Molestias repentinas Sears [employer] Corporation, la espalda, el cuello o la [place]. Le falta el aire en cualquier momento. Comienza a sudar de [place] repentina o la piel se le humedece. Siente malestar estomacal (nuseas). Vomita. Se siente repentinamente mareado o se desmaya. Se siente muy dbil o cansado. El corazn comienza a latirle rpidamente o parece que se saltea latidos. Estos sntomas pueden Customer service manager. No espere a ver si los sntomas desaparecen. Solicite atencin mdica de inmediato. Comunquese con el servicio de emergencias de su localidad (911 en los [place]). No conduzca por sus propios medios [employer]. Resumen El dolor de pecho puede deberse a muchas afecciones diferentes. La causa puede ser grave y requerir tratamiento de inmediato. Si tiene dolor de pecho, consulte al mdico de inmediato. Siga las indicaciones del mdico para tomar los medicamentos y Radio producer cambios en su estilo de vida. Concurra a todas las visitas de seguimiento como se lo haya indicado el mdico. Esto incluye las visitas para realizarle  otros estudios si el dolor de pecho no desaparece. Asegrese de [employer] Northwestern Mutualconocer los signos que indican que su afeccin ha empeorado. Obtenga ayuda de inmediato si tiene esos sntomas. Esta informacin no tiene Theme park managercomo fin reemplazar el consejo del mdico. Asegrese de hacerle al mdico cualquier pregunta que tenga. Document Released: 01/16/2009 Document Revised: 06/21/2018 Document Reviewed: 06/21/2018 Elsevier Patient Education  2020 Elsevier Inc.       Edwina BarthMiguel  Amaury Kuzel, MD [facility] Group[facility] Results Component Value Date  HGBA1C 5.6 05/31/2018 Lab Results  Component Value Date   CHOL 200 (H) 04/13/2018   HDL 47 04/13/2018   LDLCALC 110 (H) 04/13/2018   TRIG 214 (H) 04/13/2018   CHOLHDL 4.3 04/13/2018

## 2019-07-01 LAB — CBC WITH DIFFERENTIAL/PLATELET
Basophils Absolute: 0 10*3/uL (ref 0.0–0.2)
Basos: 1 %
EOS (ABSOLUTE): 0.1 10*3/uL (ref 0.0–0.4)
Eos: 1 %
Hematocrit: 39.8 % (ref 34.0–46.6)
Hemoglobin: 12.9 g/dL (ref 11.1–15.9)
Immature Grans (Abs): 0 10*3/uL (ref 0.0–0.1)
Immature Granulocytes: 0 %
Lymphocytes Absolute: 1.9 10*3/uL (ref 0.7–3.1)
Lymphs: 37 %
MCH: 27.2 pg (ref 26.6–33.0)
MCHC: 32.4 g/dL (ref 31.5–35.7)
MCV: 84 fL (ref 79–97)
Monocytes Absolute: 0.3 10*3/uL (ref 0.1–0.9)
Monocytes: 7 %
Neutrophils Absolute: 2.8 10*3/uL (ref 1.4–7.0)
Neutrophils: 54 %
Platelets: 296 10*3/uL (ref 150–450)
RBC: 4.74 x10E6/uL (ref 3.77–5.28)
RDW: 12 % (ref 11.7–15.4)
WBC: 5.1 10*3/uL (ref 3.4–10.8)

## 2019-07-01 LAB — COMPREHENSIVE METABOLIC PANEL
ALT: 12 IU/L (ref 0–32)
AST: 14 IU/L (ref 0–40)
Albumin/Globulin Ratio: 1.7 (ref 1.2–2.2)
Albumin: 4.5 g/dL (ref 3.8–4.9)
Alkaline Phosphatase: 116 IU/L (ref 39–117)
BUN/Creatinine Ratio: 21 (ref 9–23)
BUN: 15 mg/dL (ref 6–24)
Bilirubin Total: 0.5 mg/dL (ref 0.0–1.2)
CO2: 23 mmol/L (ref 20–29)
Calcium: 9.3 mg/dL (ref 8.7–10.2)
Chloride: 101 mmol/L (ref 96–106)
Creatinine, Ser: 0.7 mg/dL (ref 0.57–1.00)
GFR calc Af Amer: 114 mL/min/{1.73_m2} (ref >59)
GFR calc non Af Amer: 99 mL/min/{1.73_m2} (ref >59)
Globulin, Total: 2.6 g/dL (ref 1.5–4.5)
Glucose: 111 mg/dL — ABNORMAL HIGH (ref 65–99)
Potassium: 4 mmol/L (ref 3.5–5.2)
Sodium: 140 mmol/L (ref 134–144)
Total Protein: 7.1 g/dL (ref 6.0–8.5)

## 2019-07-01 LAB — LIPID PANEL
Chol/HDL Ratio: 2.5 ratio (ref 0.0–4.4)
Cholesterol, Total: 129 mg/dL (ref 100–199)
HDL: 51 mg/dL (ref >39)
LDL Calculated: 50 mg/dL (ref 0–99)
Triglycerides: 138 mg/dL (ref 0–149)
VLDL Cholesterol Cal: 28 mg/dL (ref 5–40)

## 2019-07-01 LAB — HEMOGLOBIN A1C
Est. average glucose Bld gHb Est-mCnc: 126 mg/dL
Hgb A1c MFr Bld: 6 % — ABNORMAL HIGH (ref 4.8–5.6)

## 2019-07-02 ENCOUNTER — Encounter: Payer: Self-pay | Admitting: Emergency Medicine

## 2019-07-21 DIAGNOSIS — K219 Gastro-esophageal reflux disease without esophagitis: Secondary | ICD-10-CM | POA: Diagnosis not present

## 2019-07-21 DIAGNOSIS — M2550 Pain in unspecified joint: Secondary | ICD-10-CM | POA: Diagnosis not present

## 2019-07-21 DIAGNOSIS — R682 Dry mouth, unspecified: Secondary | ICD-10-CM | POA: Diagnosis not present

## 2019-07-21 DIAGNOSIS — E669 Obesity, unspecified: Secondary | ICD-10-CM | POA: Diagnosis not present

## 2019-07-22 ENCOUNTER — Ambulatory Visit: Payer: BC Managed Care – PPO | Admitting: Rheumatology

## 2019-07-22 ENCOUNTER — Encounter: Payer: Self-pay | Admitting: Rheumatology

## 2019-07-22 ENCOUNTER — Ambulatory Visit: Payer: Self-pay

## 2019-07-22 ENCOUNTER — Other Ambulatory Visit: Payer: Self-pay

## 2019-07-22 VITALS — BP 113/80 | HR 83 | Resp 14 | Ht 63.0 in | Wt 195.0 lb

## 2019-07-22 DIAGNOSIS — M48062 Spinal stenosis, lumbar region with neurogenic claudication: Secondary | ICD-10-CM

## 2019-07-22 DIAGNOSIS — M25511 Pain in right shoulder: Secondary | ICD-10-CM | POA: Diagnosis not present

## 2019-07-22 DIAGNOSIS — R5383 Other fatigue: Secondary | ICD-10-CM | POA: Diagnosis not present

## 2019-07-22 DIAGNOSIS — J3089 Other allergic rhinitis: Secondary | ICD-10-CM

## 2019-07-22 DIAGNOSIS — M79672 Pain in left foot: Secondary | ICD-10-CM

## 2019-07-22 DIAGNOSIS — M79642 Pain in left hand: Secondary | ICD-10-CM | POA: Diagnosis not present

## 2019-07-22 DIAGNOSIS — K2100 Gastro-esophageal reflux disease with esophagitis, without bleeding: Secondary | ICD-10-CM

## 2019-07-22 DIAGNOSIS — M25562 Pain in left knee: Secondary | ICD-10-CM

## 2019-07-22 DIAGNOSIS — M79641 Pain in right hand: Secondary | ICD-10-CM

## 2019-07-22 DIAGNOSIS — M79671 Pain in right foot: Secondary | ICD-10-CM

## 2019-07-22 DIAGNOSIS — M25512 Pain in left shoulder: Secondary | ICD-10-CM

## 2019-07-22 DIAGNOSIS — R7303 Prediabetes: Secondary | ICD-10-CM

## 2019-07-22 DIAGNOSIS — E785 Hyperlipidemia, unspecified: Secondary | ICD-10-CM

## 2019-07-22 DIAGNOSIS — G8929 Other chronic pain: Secondary | ICD-10-CM

## 2019-07-22 DIAGNOSIS — M3500 Sicca syndrome, unspecified: Secondary | ICD-10-CM | POA: Diagnosis not present

## 2019-07-22 DIAGNOSIS — Z8669 Personal history of other diseases of the nervous system and sense organs: Secondary | ICD-10-CM

## 2019-07-22 NOTE — Progress Notes (Signed)
Office Visit Note  Patient: Kendra Rodriguez             Date of Birth: 02-10-1966           MRN: [id number]             PCP: Georgina QuintSagardia, Miguel Jose, MD Referring: Georgina QuintSagardia, Miguel Jose, * Visit Date: 07/22/2019 Occupation: unemployed  Translator-Kendra Rodriguez (daughter)  Subjective:  Pain in multiple joints and dry mouth.   History of Present Illness: Kendra Podva B Ricardo is a 53 y.o. female seen in consultation per request of Dr. Loreta AveMann.  According to patient and her daughter that her symptoms started about 2-1/2 months ago with a cold sensation in her throat especially with taking deep breath, sore throat and dry mouth.  There is no history of dry eyes.  She also gives history of episodic headaches with dizziness.  She also gives history of aura with her migraines.  She has been experiencing pain in her multiple joints including her shoulders, hands, left knee and her feet.  She notices swelling in her hands.  Her brother recently passed away with interstitial lung disease.  Activities of Daily Living:  Patient reports morning stiffness off and on for several minutes.   Patient Reports nocturnal pain.  Difficulty dressing/grooming: Denies Difficulty climbing stairs: Denies Difficulty getting out of chair: Denies Difficulty using hands for taps, buttons, cutlery, and/or writing: Reports  Review of Systems  Constitutional: Positive for fatigue. Negative for night sweats, weight gain and weight loss.  HENT: Positive for mouth sores and mouth dryness. Negative for trouble swallowing, trouble swallowing and nose dryness.   Eyes: Negative for pain, redness, visual disturbance and dryness.  Respiratory: Positive for difficulty breathing. Negative for cough and shortness of breath.   Cardiovascular: Negative for chest pain, palpitations, hypertension, irregular heartbeat and swelling in legs/feet.  Gastrointestinal: Positive for diarrhea. Negative for abdominal pain, blood in stool and constipation.   Endocrine: Negative for increased urination.  Genitourinary: Negative for difficulty urinating, painful urination and vaginal dryness.  Musculoskeletal: Positive for arthralgias, joint pain, joint swelling and morning stiffness. Negative for myalgias, muscle weakness, muscle tenderness and myalgias.  Skin: Negative for color change, rash, hair loss, skin tightness, ulcers and sensitivity to sunlight.  Allergic/Immunologic: Negative for susceptible to infections.  Neurological: Positive for dizziness and headaches. Negative for memory loss, night sweats and weakness.  Hematological: Negative for bruising/bleeding tendency and swollen glands.  Psychiatric/Behavioral: Negative for depressed mood, confusion and sleep disturbance. The patient is not nervous/anxious.     PMFS History:  Patient Active Problem List   Diagnosis Date Noted  . Dyslipidemia 06/30/2019  . Nonspecific chest pain 06/30/2019  . GERD with esophagitis 06/30/2019  . Allergic rhinitis 06/30/2019  . Prediabetes 10/17/2015  . Hypertriglyceridemia 10/17/2015  . Spinal stenosis, lumbar region, with neurogenic claudication 10/24/2014    Past Medical History:  Diagnosis Date  . Acute sinusitis   . Allergy   . Anxiety   . Depression    NO MEDICATIONS - DOING OK NOW  . Dizziness   . Ear ache    RIGHT --HX OF MULTIPLE EAR INFECTIONS AS A CHILD   . GERD (gastroesophageal reflux disease)   . Headache(784.0)    MIGRAINES  . Leg pain, left    with swelling  . Lumbar stenosis    PAIN BACK AND LEFT LEG AND NUMBNESS LEG AND FOOT  . Peripheral vascular disease (HCC)    HX OF TREATMENT FOR VARICOSE VEINS RIGHT LEG  .  Thyroid disease    TOLD BLOOD LEVELS SLIGHTLY ABNORMAL - BUT NOT REQUIRED MEDICATION    Family History  Problem Relation Age of Onset  . Hypertension Mother   . Arthritis Mother   . Diabetes Mother   . Hyperlipidemia Mother   . Arthritis Sister   . Rheum arthritis Brother   . Autoimmune disease Brother    . Lung disease Brother   . Arthritis Sister   . Gout Son   . High Cholesterol Daughter    Past Surgical History:  Procedure Laterality Date  . ABDOMINAL HYSTERECTOMY    . bladder lift     AT SAME TIME OF HYSTERECTOMY  . CHOLECYSTECTOMY    . LUMBAR LAMINECTOMY/DECOMPRESSION MICRODISCECTOMY Left 10/24/2014   Procedure: COMPLETE LUMBAR DECOMPRESSION L4-L5 CENTRAL AND MICRODISCECTOMY L4-L5 LEFT;  Surgeon: Jacki Cones, MD;  Location: [facility];  Service: Orthopedics;  Laterality: Left;   Social History   Social History Narrative  . Not on file   Immunization History  Administered Date(s) Administered  . Influenza,inj,Quad PF,6+ Mos 10/25/2014, 10/17/2015  . Tdap 01/28/2012     Objective: Vital Signs: BP 113/80 (BP Location: Left Arm, Patient Position: Sitting, Cuff Size: Normal)   Pulse 83   Resp 14   Ht 5\' 3"  (1.6 m)   Wt 195 lb (88.5 kg)   BMI 34.54 kg/m    Physical Exam Vitals signs and nursing note reviewed.  Constitutional:      Appearance: She is well-developed.  HENT:     Head: Normocephalic and atraumatic.  Eyes:     Conjunctiva/sclera: Conjunctivae normal.  Neck:     Musculoskeletal: Normal range of motion.  Cardiovascular:     Rate and Rhythm: Normal rate and regular rhythm.     Heart sounds: Normal heart sounds.  Pulmonary:     Effort: Pulmonary effort is normal.     Breath sounds: Normal breath sounds.  Abdominal:     General: Bowel sounds are normal.     Palpations: Abdomen is soft.  Lymphadenopathy:     Cervical: No cervical adenopathy.  Skin:    General: Skin is warm and dry.     Capillary Refill: Capillary refill takes less than 2 seconds.  Neurological:     Mental Status: She is alert and oriented to person, place, and time.  Psychiatric:        Behavior: Behavior normal.      Musculoskeletal Exam: C-spine was not good range of motion.  She had discomfort range of motion of her lumbar spine.  Shoulder joints elbow joints wrist joints  with good range of motion.  She has some tenderness of her PIP joints but no synovitis was noted.  She also had some tenderness over right second and third MCP joint but no synovitis was noted.  Hip joints, knee joints, ankles were in good range of motion.  She has some thickening of PIP joints in her feet but no synovitis was noted.  CDAI Exam: CDAI Score: - Patient Global: -; Provider Global: - Swollen: -; Tender: - Joint Exam   No joint exam has been documented for this visit   There is currently no information documented on the homunculus. Go to the Rheumatology activity and complete the homunculus joint exam.  Investigation: No additional findings.  Imaging: Xr Foot 2 Views Left  Result Date: 07/22/2019 PIP and DIP narrowing was noted.  No MTP, intertarsal or tibiotalar joint space narrowing was noted.  Inferior posterior calcaneal spurs were noted.  Impression: These findings are consistent with osteoarthritis of the foot.  Xr Foot 2 Views Right  Result Date: 07/22/2019 PIP and DIP narrowing was noted.  No MTP, intertarsal or tibiotalar joint space narrowing was noted.  Inferior posterior calcaneal spurs were noted. Impression: These findings are consistent with osteoarthritis of the foot  Xr Hand 2 View Left  Result Date: 07/22/2019 Minimal PIP and DIP narrowing was noted.  No MCP intercarpal radiocarpal joint space narrowing was noted.  No erosive changes were noted. Impression: These findings are consistent with mild osteoarthritis of the hand.  Xr Hand 2 View Right  Result Date: 07/22/2019 Minimal PIP and DIP narrowing was noted.  No MCP intercarpal radiocarpal joint space narrowing was noted.  No erosive changes were noted. Impression: These findings are consistent with mild osteoarthritis of the hand.  Xr Knee 3 View Left  Result Date: 07/22/2019 Moderate to severe medial compartment narrowing was noted.  Intercondylar osteophytes were noted.  No chondrocalcinosis was  noted.  Severe patellofemoral narrowing was noted. Impression: These findings are consistent with moderate to severe osteoarthritis and moderate chondromalacia patella.   Recent Labs: Lab Results  Component Value Date   WBC 5.1 06/30/2019   HGB 12.9 06/30/2019   PLT 296 06/30/2019   NA 140 06/30/2019   K 4.0 06/30/2019   CL 101 06/30/2019   CO2 23 06/30/2019   GLUCOSE 111 (H) 06/30/2019   BUN 15 06/30/2019   CREATININE 0.70 06/30/2019   BILITOT 0.5 06/30/2019   ALKPHOS 116 06/30/2019   AST 14 06/30/2019   ALT 12 06/30/2019   PROT 7.1 06/30/2019   ALBUMIN 4.5 06/30/2019   CALCIUM 9.3 06/30/2019   GFRAA 114 06/30/2019    Speciality Comments: No specialty comments available.  Procedures:  No procedures performed Allergies: Patient has no known allergies.   Assessment / Plan:     Visit Diagnoses: Sicca syndrome ([place]) -patient has history of sicca symptoms, oral ulcers.  She is also concerned about underlying autoimmune disease and her brother recently passed from interstitial lung disease.  I will obtain following labs.  Plan: ANA, RNP Antibody, Anti-Smith antibody, Sjogrens syndrome-A extractable nuclear antibody, Sjogrens syndrome-B extractable nuclear antibody, Anti-DNA antibody, double-stranded, Anti-scleroderma antibody, C3 and C4  Chronic pain of both shoulders-she had good range of motion.  No effusion was noted.  Pain in both hands -she had tenderness on palpation over MCPs and PIPs of her right hand.  No synovitis was noted.  Plan: XR Hand 2 View Right, XR Hand 2 View Left, x-ray bilateral hands were consistent mild osteoarthritis.  Sedimentation rate, Rheumatoid factor, Cyclic citrul peptide antibody, IgG, 14-3-3 eta Protein  Chronic pain of left knee -she complains of chronic discomfort in her left knee joint.  No warmth swelling or effusion was noted.  Plan: XR KNEE 3 VIEW LEFT.  The x-rays showed moderate to severe medial compartment narrowing and moderate to severe  patellofemoral narrowing.  Knee joint exercises were discussed.  A handout on knee joint exercises was given.  Use of diclofenac gel was discussed.  Pain in both feet -she complains of pain in her MTPs and ankle joints.  No synovitis was noted.  Plan: XR Foot 2 Views Right, XR Foot 2 Views Left.  X-ray of bilateral feet are consistent with osteoarthritis.  Spinal stenosis, lumbar region, with neurogenic claudication - Status post laminectomy/decompression  Prediabetes  Dyslipidemia-she is on his statins.  GERD with esophagitis-followed by Dr. Collene Mares.  She was started on Dexilant recently.  Seasonal allergic rhinitis due to other allergic trigger  History of migraine-patient is currently not seeing anybody for migraine headaches.  She gives history of aura and vertigo.  Have advised her to see neurologist.  Other fatigue - Plan: Glucose 6 phosphate dehydrogenase   Family history of autoimmune disease-ILD in her brother.  He recently passed away from the complications.   Orders: Orders Placed This Encounter  Procedures  . XR Hand 2 View Right  . XR Hand 2 View Left  . XR KNEE 3 VIEW LEFT  . XR Foot 2 Views Right  . XR Foot 2 Views Left  . Sedimentation rate  . Rheumatoid factor  . Cyclic citrul peptide antibody, IgG  . 14-3-3 eta Protein  . ANA  . RNP Antibody  . Anti-Smith antibody  . Sjogrens syndrome-A extractable nuclear antibody  . Sjogrens syndrome-B extractable nuclear antibody  . Anti-DNA antibody, double-stranded  . Anti-scleroderma antibody  . C3 and C4  . Glucose 6 phosphate dehydrogenase   No orders of the defined types were placed in this encounter.   Face-to-face time spent with patient was 50 minutes. Greater than 50% of time was spent in counseling and coordination of care.  Follow-Up Instructions: Return for Sicca symptoms and polyarthralgia.   Pollyann SavoyShaili Quinn Quam, MD  Note - This record has been created using Animal nutritionistDragon software.  Chart creation errors  have been sought, but may not always  have been located. Such creation errors do not reflect on  the standard of medical care.

## 2019-07-22 NOTE — Patient Instructions (Signed)
Journal for Nurse Practitioners, 15(4), 263-267. Retrieved August 09, 2018 from http://clinicalkey.com/nursing">  Knee Exercises Ask your health care provider which exercises are safe for you. Do exercises exactly as told by your health care provider and adjust them as directed. It is normal to feel mild stretching, pulling, tightness, or discomfort as you do these exercises. Stop right away if you feel sudden pain or your pain gets worse. Do not begin these exercises until told by your health care provider. Stretching and range-of-motion exercises These exercises warm up your muscles and joints and improve the movement and flexibility of your knee. These exercises also help to relieve pain and swelling. Knee extension, prone 1. Lie on your abdomen (prone position) on a bed. 2. Place your left / right knee just beyond the edge of the surface so your knee is not on the bed. You can put a towel under your left / right thigh just above your kneecap for comfort. 3. Relax your leg muscles and allow gravity to straighten your knee (extension). You should feel a stretch behind your left / right knee. 4. Hold this position for __________ seconds. 5. Scoot up so your knee is supported between repetitions. Repeat __________ times. Complete this exercise __________ times a day. Knee flexion, active  1. Lie on your back with both legs straight. If this causes back discomfort, bend your left / right knee so your foot is flat on the floor. 2. Slowly slide your left / right heel back toward your buttocks. Stop when you feel a gentle stretch in the front of your knee or thigh (flexion). 3. Hold this position for __________ seconds. 4. Slowly slide your left / right heel back to the starting position. Repeat __________ times. Complete this exercise __________ times a day. Quadriceps stretch, prone  1. Lie on your abdomen on a firm surface, such as a bed or padded floor. 2. Bend your left / right knee and hold  your ankle. If you cannot reach your ankle or pant leg, loop a belt around your foot and grab the belt instead. 3. Gently pull your heel toward your buttocks. Your knee should not slide out to the side. You should feel a stretch in the front of your thigh and knee (quadriceps). 4. Hold this position for __________ seconds. Repeat __________ times. Complete this exercise __________ times a day. Hamstring, supine 1. Lie on your back (supine position). 2. Loop a belt or towel over the ball of your left / right foot. The ball of your foot is on the walking surface, right under your toes. 3. Straighten your left / right knee and slowly pull on the belt to raise your leg until you feel a gentle stretch behind your knee (hamstring). ? Do not let your knee bend while you do this. ? Keep your other leg flat on the floor. 4. Hold this position for __________ seconds. Repeat __________ times. Complete this exercise __________ times a day. Strengthening exercises These exercises build strength and endurance in your knee. Endurance is the ability to use your muscles for a long time, even after they get tired. Quadriceps, isometric This exercise stretches the muscles in front of your thigh (quadriceps) without moving your knee joint (isometric). 1. Lie on your back with your left / right leg extended and your other knee bent. Put a rolled towel or small pillow under your knee if told by your health care provider. 2. Slowly tense the muscles in the front of your left /   right thigh. You should see your kneecap slide up toward your hip or see increased dimpling just above the knee. This motion will push the back of the knee toward the floor. 3. For __________ seconds, hold the muscle as tight as you can without increasing your pain. 4. Relax the muscles slowly and completely. Repeat __________ times. Complete this exercise __________ times a day. Straight leg raises This exercise stretches the muscles in front  of your thigh (quadriceps) and the muscles that move your hips (hip flexors). 1. Lie on your back with your left / right leg extended and your other knee bent. 2. Tense the muscles in the front of your left / right thigh. You should see your kneecap slide up or see increased dimpling just above the knee. Your thigh may even shake a bit. 3. Keep these muscles tight as you raise your leg 4-6 inches (10-15 cm) off the floor. Do not let your knee bend. 4. Hold this position for __________ seconds. 5. Keep these muscles tense as you lower your leg. 6. Relax your muscles slowly and completely after each repetition. Repeat __________ times. Complete this exercise __________ times a day. Hamstring, isometric 1. Lie on your back on a firm surface. 2. Bend your left / right knee about __________ degrees. 3. Dig your left / right heel into the surface as if you are trying to pull it toward your buttocks. Tighten the muscles in the back of your thighs (hamstring) to "dig" as hard as you can without increasing any pain. 4. Hold this position for __________ seconds. 5. Release the tension gradually and allow your muscles to relax completely for __________ seconds after each repetition. Repeat __________ times. Complete this exercise __________ times a day. Hamstring curls If told by your health care provider, do this exercise while wearing ankle weights. Begin with __________ lb weights. Then increase the weight by 1 lb (0.5 kg) increments. Do not wear ankle weights that are more than __________ lb. 1. Lie on your abdomen with your legs straight. 2. Bend your left / right knee as far as you can without feeling pain. Keep your hips flat against the floor. 3. Hold this position for __________ seconds. 4. Slowly lower your leg to the starting position. Repeat __________ times. Complete this exercise __________ times a day. Squats This exercise strengthens the muscles in front of your thigh and knee  (quadriceps). 1. Stand in front of a table, with your feet and knees pointing straight ahead. You may rest your hands on the table for balance but not for support. 2. Slowly bend your knees and lower your hips like you are going to sit in a chair. ? Keep your weight over your heels, not over your toes. ? Keep your lower legs upright so they are parallel with the table legs. ? Do not let your hips go lower than your knees. ? Do not bend lower than told by your health care provider. ? If your knee pain increases, do not bend as low. 3. Hold the squat position for __________ seconds. 4. Slowly push with your legs to return to standing. Do not use your hands to pull yourself to standing. Repeat __________ times. Complete this exercise __________ times a day. Wall slides This exercise strengthens the muscles in front of your thigh and knee (quadriceps). 1. Lean your back against a smooth wall or door, and walk your feet out 18-24 inches (46-61 cm) from it. 2. Place your feet hip-width apart. 3.   Slowly slide down the wall or door until your knees bend __________ degrees. Keep your knees over your heels, not over your toes. Keep your knees in line with your hips. 4. Hold this position for __________ seconds. Repeat __________ times. Complete this exercise __________ times a day. Straight leg raises This exercise strengthens the muscles that rotate the leg at the hip and move it away from your body (hip abductors). 1. Lie on your side with your left / right leg in the top position. Lie so your head, shoulder, knee, and hip line up. You may bend your bottom knee to help you keep your balance. 2. Roll your hips slightly forward so your hips are stacked directly over each other and your left / right knee is facing forward. 3. Leading with your heel, lift your top leg 4-6 inches (10-15 cm). You should feel the muscles in your outer hip lifting. ? Do not let your foot drift forward. ? Do not let your knee  roll toward the ceiling. 4. Hold this position for __________ seconds. 5. Slowly return your leg to the starting position. 6. Let your muscles relax completely after each repetition. Repeat __________ times. Complete this exercise __________ times a day. Straight leg raises This exercise stretches the muscles that move your hips away from the front of the pelvis (hip extensors). 1. Lie on your abdomen on a firm surface. You can put a pillow under your hips if that is more comfortable. 2. Tense the muscles in your buttocks and lift your left / right leg about 4-6 inches (10-15 cm). Keep your knee straight as you lift your leg. 3. Hold this position for __________ seconds. 4. Slowly lower your leg to the starting position. 5. Let your leg relax completely after each repetition. Repeat __________ times. Complete this exercise __________ times a day. This information is not intended to replace advice given to you by your health care provider. Make sure you discuss any questions you have with your health care provider. Document Released: 09/03/2005 Document Revised: 08/10/2018 Document Reviewed: 08/10/2018 Elsevier Patient Education  2020 Elsevier Inc.  

## 2019-07-28 LAB — SJOGRENS SYNDROME-B EXTRACTABLE NUCLEAR ANTIBODY: SSB (La) (ENA) Antibody, IgG: <1 AI

## 2019-07-28 LAB — C3 AND C4
C3 Complement: 149 mg/dL (ref 83–193)
C4 Complement: 31 mg/dL (ref 15–57)

## 2019-07-28 LAB — ANA: Anti Nuclear Antibody (ANA): NEGATIVE

## 2019-07-28 LAB — ANTI-SMITH ANTIBODY: ENA SM Ab Ser-aCnc: <1 AI

## 2019-07-28 LAB — SEDIMENTATION RATE: Sed Rate: 19 mm/h (ref 0–30)

## 2019-07-28 LAB — CYCLIC CITRUL PEPTIDE ANTIBODY, IGG: Cyclic Citrullin Peptide Ab: <16 UNITS

## 2019-07-28 LAB — RNP ANTIBODY: Ribonucleic Protein(ENA) Antibody, IgG: <1 AI

## 2019-07-28 LAB — SJOGRENS SYNDROME-A EXTRACTABLE NUCLEAR ANTIBODY: SSA (Ro) (ENA) Antibody, IgG: <1 AI

## 2019-07-28 LAB — GLUCOSE 6 PHOSPHATE DEHYDROGENASE: G-6PDH: 16.1 U/g Hgb (ref 7.0–20.5)

## 2019-07-28 LAB — ANTI-DNA ANTIBODY, DOUBLE-STRANDED: ds DNA Ab: 1 IU/mL

## 2019-07-28 LAB — RHEUMATOID FACTOR: Rheumatoid fact SerPl-aCnc: <14 IU/mL (ref <14)

## 2019-07-28 LAB — 14-3-3 ETA PROTEIN: 14-3-3 eta Protein: <0.2 ng/mL (ref <0.2)

## 2019-07-28 LAB — ANTI-SCLERODERMA ANTIBODY: Scleroderma (Scl-70) (ENA) Antibody, IgG: <1 AI

## 2019-08-05 NOTE — Progress Notes (Signed)
Office Visit Note  Patient: Kendra Rodriguez             Date of Birth: [birth year]           MRN: [id number]             PCP: Georgina QuintSagardia, Miguel Jose, MD Referring: Georgina QuintSagardia, Miguel Jose, * Visit Date: 08/19/2019 Occupation: @GUAROCC @  Subjective:  Throat irritation   History of Present Illness: Kendra Podva B Perry is a 53 y.o. female with history of sicca symptoms and osteoarthritis.  She presents to discuss recent lab work.  She states that she has not developed any new or worsening symptoms since her initial office visit.  She continues to have pain in bilateral hands, left knee joint, and bilateral feet.  He denies any joint swelling.  She states her discomfort is worse with activity.  She continues to have chronic sicca symptoms.  She states that her biggest concern is the throat irritation she experiences.  She states that at times it feels as though her throat is closing up.  She has had a full work-up performed by Dr. Loreta AveMann.  She had an endoscopy as well as a colonoscopy.  She has also been evaluated by ENT according to the patient but we do not have any records of that.    Activities of Daily Living:  Patient reports morning stiffness for 0 minutes.   Patient Reports nocturnal pain.  Difficulty dressing/grooming: Denies Difficulty climbing stairs: Denies Difficulty getting out of chair: Reports Difficulty using hands for taps, buttons, cutlery, and/or writing: Reports  Review of Systems  Constitutional: Positive for fatigue.  HENT: Positive for mouth dryness. Negative for mouth sores and nose dryness.   Eyes: Negative for itching and dryness.  Respiratory: Negative for shortness of breath, wheezing and difficulty breathing.   Cardiovascular: Positive for chest pain. Negative for palpitations.  Gastrointestinal: Positive for diarrhea. Negative for blood in stool and constipation.  Endocrine: Negative for increased urination.  Genitourinary: Negative for difficulty urinating and painful  urination.  Musculoskeletal: Positive for arthralgias and joint pain. Negative for joint swelling and morning stiffness.  Skin: Negative for rash and hair loss.  Allergic/Immunologic: Negative for susceptible to infections.  Neurological: Positive for dizziness, numbness and headaches. Negative for memory loss and weakness.  Hematological: Negative for bruising/bleeding tendency.  Psychiatric/Behavioral: Negative for depressed mood and confusion. The patient is not nervous/anxious.     PMFS History:  Patient Active Problem List   Diagnosis Date Noted  . Dyslipidemia 06/30/2019  . Nonspecific chest pain 06/30/2019  . GERD with esophagitis 06/30/2019  . Allergic rhinitis 06/30/2019  . Prediabetes 10/17/2015  . Hypertriglyceridemia 10/17/2015  . Spinal stenosis, lumbar region, with neurogenic claudication 10/24/2014    Past Medical History:  Diagnosis Date  . Acute sinusitis   . Allergy   . Anxiety   . Depression    NO MEDICATIONS - DOING OK NOW  . Dizziness   . Ear ache    RIGHT --HX OF MULTIPLE EAR INFECTIONS AS A CHILD   . GERD (gastroesophageal reflux disease)   . Headache(784.0)    MIGRAINES  . Leg pain, left    with swelling  . Lumbar stenosis    PAIN BACK AND LEFT LEG AND NUMBNESS LEG AND FOOT  . Peripheral vascular disease (HCC)    HX OF TREATMENT FOR VARICOSE VEINS RIGHT LEG  . Thyroid disease    TOLD BLOOD LEVELS SLIGHTLY ABNORMAL - BUT NOT REQUIRED MEDICATION    Family  History  Problem Relation Age of Onset  . Hypertension Mother   . Arthritis Mother   . Diabetes Mother   . Hyperlipidemia Mother   . Arthritis Sister   . Rheum arthritis Brother   . Autoimmune disease Brother   . Lung disease Brother   . Arthritis Sister   . Gout Son   . High Cholesterol Daughter    Past Surgical History:  Procedure Laterality Date  . ABDOMINAL HYSTERECTOMY    . bladder lift     AT SAME TIME OF HYSTERECTOMY  . CHOLECYSTECTOMY    . LUMBAR LAMINECTOMY/DECOMPRESSION  MICRODISCECTOMY Left 10/24/2014   Procedure: COMPLETE LUMBAR DECOMPRESSION L4-L5 CENTRAL AND MICRODISCECTOMY L4-L5 LEFT;  Surgeon: Tobi Bastos, MD;  Location: [facility];  Service: Orthopedics;  Laterality: Left;   Social History   Social History Narrative  . Not on file   Immunization History  Administered Date(s) Administered  . Influenza,inj,Quad PF,6+ Mos 10/25/2014, 10/17/2015  . Tdap 01/28/2012     Objective: Vital Signs: BP 119/69 (BP Location: Left Arm, Patient Position: Sitting, Cuff Size: Normal)   Pulse 77   Resp 13   Ht 5\' 3"  (1.6 m)   Wt 196 lb (88.9 kg)   BMI 34.72 kg/m    Physical Exam Vitals signs and nursing note reviewed.  Constitutional:      Appearance: She is well-developed.  HENT:     Head: Normocephalic and atraumatic.  Eyes:     Conjunctiva/sclera: Conjunctivae normal.  Neck:     Musculoskeletal: Normal range of motion.  Cardiovascular:     Rate and Rhythm: Normal rate and regular rhythm.     Heart sounds: Normal heart sounds.  Pulmonary:     Effort: Pulmonary effort is normal.     Breath sounds: Normal breath sounds.  Abdominal:     General: Bowel sounds are normal.     Palpations: Abdomen is soft.  Lymphadenopathy:     Cervical: No cervical adenopathy.  Skin:    General: Skin is warm and dry.     Capillary Refill: Capillary refill takes less than 2 seconds.  Neurological:     Mental Status: She is alert and oriented to person, place, and time.  Psychiatric:        Behavior: Behavior normal.      Musculoskeletal Exam: C-spine, thoracic spine, lumbar spine good range of motion.  No midline spinal tenderness.  No SI joint tenderness.  Shoulder joints, elbow joints, strength, MCPs, PIPs and DIPs good range of motion no synovitis.  Mild DIP synovial thickening.  Hip joints, knee joints ankle joints, MTPs, PIPs and DIPs good range of motion no synovitis.  No warmth or effusion of bilateral knee joints.  No tenderness or swelling of ankle  joints.  CDAI Exam: CDAI Score: - Patient Global: -; Provider Global: - Swollen: -; Tender: - Joint Exam   No joint exam has been documented for this visit   There is currently no information documented on the homunculus. Go to the Rheumatology activity and complete the homunculus joint exam.  Investigation: No additional findings. Component     Latest Ref Rng & Units 07/22/2019  C3 Complement     83 - 193 mg/dL 149  C4 Complement     15 - 57 mg/dL 31  Sed Rate     0 - 30 mm/h 19  RA Latex Turbid.     <40 IU/mL <98  Cyclic Citrullin Peptide Ab     UNITS <16  14-3-3 eta Protein     <0.2 ng/mL <0.2  Anti Nuclear Antibody (ANA)     NEGATIVE NEGATIVE  Ribonucleic Protein(ENA) Antibody, IgG     <1.0 NEG AI <1.0 NEG  ENA SM Ab Ser-aCnc     <1.0 NEG AI <1.0 NEG  SSA (Ro) (ENA) Antibody, IgG     <1.0 NEG AI <1.0 NEG  SSB (La) (ENA) Antibody, IgG     <1.0 NEG AI <1.0 NEG  ds DNA Ab     IU/mL 1  Scleroderma (Scl-70) (ENA) Antibody, IgG     <1.0 NEG AI <1.0 NEG  G-6PDH     7.0 - 20.5 U/g Hgb 16.1   Imaging: Xr Foot 2 Views Left  Result Date: 07/22/2019 PIP and DIP narrowing was noted.  No MTP, intertarsal or tibiotalar joint space narrowing was noted.  Inferior posterior calcaneal spurs were noted. Impression: These findings are consistent with osteoarthritis of the foot.  Xr Foot 2 Views Right  Result Date: 07/22/2019 PIP and DIP narrowing was noted.  No MTP, intertarsal or tibiotalar joint space narrowing was noted.  Inferior posterior calcaneal spurs were noted. Impression: These findings are consistent with osteoarthritis of the foot  Xr Hand 2 View Left  Result Date: 07/22/2019 Minimal PIP and DIP narrowing was noted.  No MCP intercarpal radiocarpal joint space narrowing was noted.  No erosive changes were noted. Impression: These findings are consistent with mild osteoarthritis of the hand.  Xr Hand 2 View Right  Result Date: 07/22/2019 Minimal PIP and DIP  narrowing was noted.  No MCP intercarpal radiocarpal joint space narrowing was noted.  No erosive changes were noted. Impression: These findings are consistent with mild osteoarthritis of the hand.  Xr Knee 3 View Left  Result Date: 07/22/2019 Moderate to severe medial compartment narrowing was noted.  Intercondylar osteophytes were noted.  No chondrocalcinosis was noted.  Severe patellofemoral narrowing was noted. Impression: These findings are consistent with moderate to severe osteoarthritis and moderate chondromalacia patella.   Recent Labs: Lab Results  Component Value Date   WBC 5.1 06/30/2019   HGB 12.9 06/30/2019   PLT 296 06/30/2019   NA 140 06/30/2019   K 4.0 06/30/2019   CL 101 06/30/2019   CO2 23 06/30/2019   GLUCOSE 111 (H) 06/30/2019   BUN 15 06/30/2019   CREATININE 0.70 06/30/2019   BILITOT 0.5 06/30/2019   ALKPHOS 116 06/30/2019   AST 14 06/30/2019   ALT 12 06/30/2019   PROT 7.1 06/30/2019   ALBUMIN 4.5 06/30/2019   CALCIUM 9.3 06/30/2019   GFRAA 114 06/30/2019    Speciality Comments: No specialty comments available.  Procedures:  No procedures performed Allergies: Patient has no known allergies.   Assessment / Plan:     Visit Diagnoses: Sicca syndrome (HCC) - ANA-, ENA-, RF-, CCP-, 14-3-3 eta-: We reviewed lab work from 07/22/2019.  All questions were addressed.  She continues to have sicca symptoms.  We discussed trying over-the-counter products such as Biotene products, XyliMelts, and refresh eyedrops for symptomatic relief.  We also discussed trying pilocarpine to help with sicca symptoms.  Her biggest concern is the throat irritation she has been experiencing.  She states that at times it feels as though her throat is closing up.  She has been evaluated by Dr. Loreta Ave who performed a colonoscopy and endoscopy which were unremarkable as well as a MRI of the abdomen.  She has not had any symptoms of GERD recently.  She is taking Dexilant and  Pepcid as prescribed.   She also takes Carafate daily. She was advised to follow up with ENT to further discuss the throat irritation she is experiencing. She will follow up as needed.   Primary osteoarthritis of left knee - moderate to severe osteoarthritis and moderate chondromalacia patella: She has good ROM with no discomfort at this time.  No warmth or effusion noted.   Primary osteoarthritis of both feet: She experiences intermittent pain in both feet.   Primary osteoarthritis of both hands: She has mild DIP synovial thickening consistent with osteoarthritis of both hands.  She has no tenderness or synovitis on exam.  She experiences discomfort in both hands with activity.  Joint protection and muscle strengthening were discussed.  She was given a handout of hand exercises to perform.   Spinal stenosis, lumbar region, with neurogenic claudication: She has no discomfort at this time.   Prediabetes  Dyslipidemia  History of gastroesophageal reflux (GERD)  History of migraine  Other fatigue  Orders: No orders of the defined types were placed in this encounter.  No orders of the defined types were placed in this encounter.  Face-to-face time spent with patient was over 30 minutes.  Greater than 50% time was spent in consultation and coordination of care.  Follow-Up Instructions: Return if symptoms worsen or fail to improve, for Osteoarthritis, Sicca symptoms .   Gearldine Bienenstock, PA-C   I examined and evaluated the patient with Sherron Ales PA.  I had detailed discussion with the patient and her daughter today.  Her daughter was a Nurse, learning disability.  She states her main problem is dryness in her throat.  The oral dryness does not bother her.  Over-the-counter products were discussed.  She is not satisfied and would like further exploration of dry throat.  Have advised her to schedule an appointment with the ENT.  She has seen ENT in the past.  The plan of care was discussed as noted above.  Pollyann Savoy, MD   Note - This record has been created using Animal nutritionist.  Chart creation errors have been sought, but may not always  have been located. Such creation errors do not reflect on  the standard of medical care.

## 2019-08-09 ENCOUNTER — Ambulatory Visit: Payer: BC Managed Care – PPO | Admitting: Cardiology

## 2019-08-19 ENCOUNTER — Encounter: Payer: Self-pay | Admitting: Rheumatology

## 2019-08-19 ENCOUNTER — Other Ambulatory Visit: Payer: Self-pay

## 2019-08-19 ENCOUNTER — Ambulatory Visit (INDEPENDENT_AMBULATORY_CARE_PROVIDER_SITE_OTHER): Payer: BC Managed Care – PPO | Admitting: Rheumatology

## 2019-08-19 VITALS — BP 119/69 | HR 77 | Resp 13 | Ht 63.0 in | Wt 196.0 lb

## 2019-08-19 DIAGNOSIS — M19042 Primary osteoarthritis, left hand: Secondary | ICD-10-CM

## 2019-08-19 DIAGNOSIS — M19072 Primary osteoarthritis, left ankle and foot: Secondary | ICD-10-CM

## 2019-08-19 DIAGNOSIS — M19071 Primary osteoarthritis, right ankle and foot: Secondary | ICD-10-CM

## 2019-08-19 DIAGNOSIS — M1712 Unilateral primary osteoarthritis, left knee: Secondary | ICD-10-CM | POA: Diagnosis not present

## 2019-08-19 DIAGNOSIS — Z8719 Personal history of other diseases of the digestive system: Secondary | ICD-10-CM

## 2019-08-19 DIAGNOSIS — M48062 Spinal stenosis, lumbar region with neurogenic claudication: Secondary | ICD-10-CM

## 2019-08-19 DIAGNOSIS — Z8669 Personal history of other diseases of the nervous system and sense organs: Secondary | ICD-10-CM

## 2019-08-19 DIAGNOSIS — M19041 Primary osteoarthritis, right hand: Secondary | ICD-10-CM | POA: Diagnosis not present

## 2019-08-19 DIAGNOSIS — M3500 Sicca syndrome, unspecified: Secondary | ICD-10-CM | POA: Diagnosis not present

## 2019-08-19 DIAGNOSIS — R7303 Prediabetes: Secondary | ICD-10-CM

## 2019-08-19 DIAGNOSIS — R5383 Other fatigue: Secondary | ICD-10-CM

## 2019-08-19 DIAGNOSIS — E785 Hyperlipidemia, unspecified: Secondary | ICD-10-CM

## 2019-08-19 NOTE — Patient Instructions (Addendum)
For mouth dryness: you can try Biotene products or xilymelts  For eye dryness: OTC eye drops-Refresh      Hand Exercises Hand exercises can be helpful for almost anyone. These exercises can strengthen the hands, improve flexibility and movement, and increase blood flow to the hands. These results can make work and daily tasks easier. Hand exercises can be especially helpful for people who have joint pain from arthritis or have nerve damage from overuse (carpal tunnel syndrome). These exercises can also help people who have injured a hand. Exercises Most of these hand exercises are gentle stretching and motion exercises. It is usually safe to do them often throughout the day. Warming up your hands before exercise may help to reduce stiffness. You can do this with gentle massage or by placing your hands in warm water for 10-15 minutes. It is normal to feel some stretching, pulling, tightness, or mild discomfort as you begin new exercises. This will gradually improve. Stop an exercise right away if you feel sudden, severe pain or your pain gets worse. Ask your health care provider which exercises are best for you. Knuckle bend or "claw" fist 1. Stand or sit with your arm, hand, and all five fingers pointed straight up. Make sure to keep your wrist straight during the exercise. 2. Gently bend your fingers down toward your palm until the tips of your fingers are touching the top of your palm. Keep your big knuckle straight and just bend the small knuckles in your fingers. 3. Hold this position for __________ seconds. 4. Straighten (extend) your fingers back to the starting position. Repeat this exercise 5-10 times with each hand. Full finger fist 1. Stand or sit with your arm, hand, and all five fingers pointed straight up. Make sure to keep your wrist straight during the exercise. 2. Gently bend your fingers into your palm until the tips of your fingers are touching the middle of your palm. 3. Hold  this position for __________ seconds. 4. Extend your fingers back to the starting position, stretching every joint fully. Repeat this exercise 5-10 times with each hand. Straight fist 1. Stand or sit with your arm, hand, and all five fingers pointed straight up. Make sure to keep your wrist straight during the exercise. 2. Gently bend your fingers at the big knuckle, where your fingers meet your hand, and the middle knuckle. Keep the knuckle at the tips of your fingers straight and try to touch the bottom of your palm. 3. Hold this position for __________ seconds. 4. Extend your fingers back to the starting position, stretching every joint fully. Repeat this exercise 5-10 times with each hand. Tabletop 1. Stand or sit with your arm, hand, and all five fingers pointed straight up. Make sure to keep your wrist straight during the exercise. 2. Gently bend your fingers at the big knuckle, where your fingers meet your hand, as far down as you can while keeping the small knuckles in your fingers straight. Think of forming a tabletop with your fingers. 3. Hold this position for __________ seconds. 4. Extend your fingers back to the starting position, stretching every joint fully. Repeat this exercise 5-10 times with each hand. Finger spread 1. Place your hand flat on a table with your palm facing down. Make sure your wrist stays straight as you do this exercise. 2. Spread your fingers and thumb apart from each other as far as you can until you feel a gentle stretch. Hold this position for __________ seconds. 3.  Bring your fingers and thumb tight together again. Hold this position for __________ seconds. Repeat this exercise 5-10 times with each hand. Making circles 1. Stand or sit with your arm, hand, and all five fingers pointed straight up. Make sure to keep your wrist straight during the exercise. 2. Make a circle by touching the tip of your thumb to the tip of your index finger. 3. Hold for  __________ seconds. Then open your hand wide. 4. Repeat this motion with your thumb and each finger on your hand. Repeat this exercise 5-10 times with each hand. Thumb motion 1. Sit with your forearm resting on a table and your wrist straight. Your thumb should be facing up toward the ceiling. Keep your fingers relaxed as you move your thumb. 2. Lift your thumb up as high as you can toward the ceiling. Hold for __________ seconds. 3. Bend your thumb across your palm as far as you can, reaching the tip of your thumb for the small finger (pinkie) side of your palm. Hold for __________ seconds. Repeat this exercise 5-10 times with each hand. Grip strengthening  1. Hold a stress ball or other soft ball in the middle of your hand. 2. Slowly increase the pressure, squeezing the ball as much as you can without causing pain. Think of bringing the tips of your fingers into the middle of your palm. All of your finger joints should bend when doing this exercise. 3. Hold your squeeze for __________ seconds, then relax. Repeat this exercise 5-10 times with each hand. Contact a health care provider if:  Your hand pain or discomfort gets much worse when you do an exercise.  Your hand pain or discomfort does not improve within 2 hours after you exercise. If you have any of these problems, stop doing these exercises right away. Do not do them again unless your health care provider says that you can. Get help right away if:  You develop sudden, severe hand pain or swelling. If this happens, stop doing these exercises right away. Do not do them again unless your health care provider says that you can. This information is not intended to replace advice given to you by your health care provider. Make sure you discuss any questions you have with your health care provider. Document Released: 10/01/2015 Document Revised: 02/10/2019 Document Reviewed: 10/21/2018 Elsevier Patient Education  2020 [employer].

## 2019-08-31 ENCOUNTER — Encounter: Payer: Self-pay | Admitting: Cardiology

## 2019-08-31 ENCOUNTER — Other Ambulatory Visit: Payer: Self-pay

## 2019-08-31 ENCOUNTER — Ambulatory Visit: Payer: BC Managed Care – PPO | Admitting: Cardiology

## 2019-08-31 VITALS — BP 131/89 | HR 76 | Ht 63.0 in | Wt 195.0 lb

## 2019-08-31 DIAGNOSIS — E782 Mixed hyperlipidemia: Secondary | ICD-10-CM

## 2019-08-31 DIAGNOSIS — Z7189 Other specified counseling: Secondary | ICD-10-CM

## 2019-08-31 DIAGNOSIS — R079 Chest pain, unspecified: Secondary | ICD-10-CM

## 2019-08-31 NOTE — Patient Instructions (Signed)
Medication Instructions:  Your physician recommends that you continue on your current medications as directed. Please refer to the Current Medication list given to you today.  If you need a refill on your cardiac medications before your next appointment, please call your pharmacy.   Lab work: NONE  Testing/Procedures: Your physician has requested that you have an exercise tolerance test. For further information please visit [email or web address]. Please also follow instruction sheet, as given.   Follow-Up: At [facility], you and your health needs are our priority.  As part of our continuing mission to provide you with exceptional heart care, we have created designated Provider Care Teams.  These Care Teams include your primary Cardiologist (physician) and Advanced Practice Providers (APPs -  Physician Assistants and Nurse Practitioners) who all work together to provide you with the care you need, when you need it. You may see Buford Dresser, MD or one of the following Advanced Practice Providers on your designated Care Team:    Rosaria Ferries, PA-C  Jory Sims, DNP, ANP  Cadence Kathlen Mody, NP Your physician wants you to follow-up in: 1 month. You will receive a reminder letter in the mail two months in advance. If you don't receive a letter, please call our office to schedule the follow-up appointment.   Any Other Special Instructions Will Be Listed Below (If Applicable). You will have to have a Covid test prior to your treadmill test.

## 2019-08-31 NOTE — Progress Notes (Signed)
Cardiology Office Note:    Date:  08/31/2019   ID:  Kendra Rodriguez, DOB [birth year], MRN [id number]  PCP:  [name], MD  Cardiologist:  Jodelle Red, MD  Referring MD: [name], *   CC: new patient evaluation for chest pain  History of Present Illness:    Kendra Rodriguez is a 53 y.o. female with a hx of GERD who is seen as a new consult at the request of [name], * for the evaluation and management of chest pain.  Speaks some English, also here with her daughter who assists with translation as well.  Chest pain: -Initial onset: 3-4 months ago -Quality: pressure, always mid chest, no radiation. Happens at rest sometimes but more with exertion. -Frequency: every day, 2-3 times/day -Duration: 20-30 minutes. -Associated symptoms: feels nauseated, shortness of breath sometimes with it.  -Aggravating/alleviating factors: none that she knows of -Prior cardiac history: none -Prior ECG: NSR -Prior workup: no testing -Prior treatment: none -Alcohol: none -Tobacco: never -Comorbidities:  Prediabetic, on metformin. Has hyperlipidemia, on atorvastatin. Denies hypertension, kidney disease -Exercise level: cleans house, cooks, on her feet all day. Not limited in her activity level. -Cardiac ROS: no shortness of breath, no PND, no orthopnea, no LE edema (beyond chronic ankle arthritis swelling), no syncope -Family history: none that she is aware of.   Past Medical History:  Diagnosis Date  . Acute sinusitis   . Allergy   . Anxiety   . Depression    NO MEDICATIONS - DOING OK NOW  . Dizziness   . Ear ache    RIGHT --HX OF MULTIPLE EAR INFECTIONS AS A CHILD   . GERD (gastroesophageal reflux disease)   . Headache(784.0)    MIGRAINES  . Leg pain, left    with swelling  . Lumbar stenosis    PAIN BACK AND LEFT LEG AND NUMBNESS LEG AND FOOT  . Peripheral vascular disease (HCC)    HX OF TREATMENT FOR VARICOSE VEINS RIGHT LEG  . Thyroid  disease    TOLD BLOOD LEVELS SLIGHTLY ABNORMAL - BUT NOT REQUIRED MEDICATION    Past Surgical History:  Procedure Laterality Date  . ABDOMINAL HYSTERECTOMY    . bladder lift     AT SAME TIME OF HYSTERECTOMY  . CHOLECYSTECTOMY    . LUMBAR LAMINECTOMY/DECOMPRESSION MICRODISCECTOMY Left 10/24/2014   Procedure: COMPLETE LUMBAR DECOMPRESSION L4-L5 CENTRAL AND MICRODISCECTOMY L4-L5 LEFT;  Surgeon: Jacki Cones, MD;  Location: [facility];  Service: Orthopedics;  Laterality: Left;    Current Medications: Current Outpatient Medications on File Prior to Visit  Medication Sig  . atorvastatin (LIPITOR) 40 MG tablet Take 1 tablet (40 mg total) by mouth daily.  Marland Kitchen dexlansoprazole (DEXILANT) 60 MG capsule Take 1 capsule (60 mg total) by mouth every morning.  . fluticasone (FLONASE) 50 MCG/ACT nasal spray Place 2 sprays into both nostrils daily.  . metFORMIN (GLUCOPHAGE) 850 MG tablet Take 1 tablet (850 mg total) by mouth 2 (two) times daily with a meal.   No current facility-administered medications on file prior to visit.      Allergies:   Patient has no known allergies.   Social History   Tobacco Use  . Smoking status: Never Smoker  . Smokeless tobacco: Never Used  Substance Use Topics  . Alcohol use: No  . Drug use: No    Family History: family history includes Arthritis in her mother, sister, and sister; Autoimmune disease in her brother; Diabetes in her mother; Gout  in her son; High Cholesterol in her daughter; Hyperlipidemia in her mother; Hypertension in her mother; Lung disease in her brother; Rheum arthritis in her brother.  ROS:   Please see the history of present illness.  Additional pertinent ROS: Constitutional: Negative for chills, fever, night sweats, unintentional weight loss  HENT: Negative for ear pain and hearing loss.   Eyes: Negative for loss of vision and eye pain.  Respiratory: Negative for cough, sputum, wheezing.   Cardiovascular: See HPI. Gastrointestinal:  Negative for abdominal pain, melena, and hematochezia.  Genitourinary: Negative for dysuria and hematuria.  Musculoskeletal: Negative for falls and myalgias.  Skin: Negative for itching and rash.  Neurological: Negative for focal weakness, focal sensory changes and loss of consciousness.  Endo/Heme/Allergies: Does not bruise/bleed easily.     EKGs/Labs/Other Studies Reviewed:    The following studies were reviewed today: ABI/duplex studies in 2012  EKG:  EKG is personally reviewed.  The ekg ordered today demonstrates NSR at 76 bpm  Recent Labs: 06/30/2019: ALT 12; BUN 15; Creatinine, Ser 0.70; Hemoglobin 12.9; Platelets 296; Potassium 4.0; Sodium 140  Recent Lipid Panel    Component Value Date/Time   CHOL 129 06/30/2019 0907   TRIG 138 06/30/2019 0907   HDL 51 06/30/2019 0907   CHOLHDL 2.5 06/30/2019 0907   CHOLHDL 2.9 10/17/2015 1026   VLDL 29 10/17/2015 1026   LDLCALC 50 06/30/2019 0907    Physical Exam:    VS:  BP 131/89   Pulse 76   Ht 5\' 3"  (1.6 m)   Wt 195 lb (88.5 kg)   SpO2 99%   BMI 34.54 kg/m     Wt Readings from Last 3 Encounters:  08/31/19 195 lb (88.5 kg)  08/19/19 196 lb (88.9 kg)  07/22/19 195 lb (88.5 kg)    GEN: Well nourished, well developed in no acute distress HEENT: Normal, moist mucous membranes NECK: No JVD CARDIAC: regular rhythm, normal S1 and S2, no murmurs, rubs, gallops.  VASCULAR: Radial and DP pulses 2+ bilaterally. No carotid bruits RESPIRATORY:  Clear to auscultation without rales, wheezing or rhonchi  ABDOMEN: Soft, non-tender, non-distended MUSCULOSKELETAL:  Ambulates independently SKIN: Warm and dry, no edema NEUROLOGIC:  Alert and oriented x 3. No focal neuro deficits noted. PSYCHIATRIC:  Normal affect    ASSESSMENT:    1. Chest pain of uncertain etiology   2. Nonspecific chest pain   3. Cardiac risk counseling   4. Counseling on health promotion and disease prevention   5. Mixed hyperlipidemia    PLAN:    Chest  pain: both typical and atypical components, does have risk factors. Warrants further evaluation. -discussed treadmill stress, nuclear stress/lexiscan, and CT coronary angiography. Discussed pros and cons of each, including but not limited to false positive/false negative risk, radiation risk, and risk of IV contrast dye. Based on shared decision making, decision was made to pursue exercise treadmill stress test. -if treadmill abnormal but not high risk, she would prefer CT coronary. If high risk, will need to discuss cath  Hyperlipidemia, mixed: previously with Tchol 200, TG 214, LDL 110. Since starting atorvastatin, Tchol 129, TG 128, LDL 50 -continue atorvastatin  Cardiac risk counseling and prevention recommendations: -recommend heart healthy/Mediterranean diet, with whole grains, fruits, vegetable, fish, lean meats, nuts, and olive oil. Limit salt. -recommend moderate walking, 3-5 times/week for 30-50 minutes each session. Aim for at least 150 minutes.week. Goal should be pace of 3 miles/hours, or walking 1.5 miles in 30 minutes -recommend avoidance of tobacco products.  Avoid excess alcohol. -Additional risk factor control:  -Diabetes risk: prediabetic, on metformin  -Lipids:  As above  -Blood pressure control; on no meds, <140/90  -Weight:  BMI 34, would benefit from weight loss -ASCVD risk score: The ASCVD Risk score Mikey Bussing DC Jr., et al., 2013) failed to calculate for the following reasons:   The valid total cholesterol range is 130 to 320 mg/dL    Plan for follow up: 1 month  Medication Adjustments/Labs and Tests Ordered: Current medicines are reviewed at length with the patient today.  Concerns regarding medicines are outlined above.  Orders Placed This Encounter  Procedures  . EXERCISE TOLERANCE TEST (ETT)  . EKG 12-Lead   No orders of the defined types were placed in this encounter.   Patient Instructions  Medication Instructions:  Your physician recommends that you  continue on your current medications as directed. Please refer to the Current Medication list given to you today.  If you need a refill on your cardiac medications before your next appointment, please call your pharmacy.   Lab work: NONE  Testing/Procedures: Your physician has requested that you have an exercise tolerance test. For further information please visit [email or web address]. Please also follow instruction sheet, as given.   Follow-Up: At [facility], you and your health needs are our priority.  As part of our continuing mission to provide you with exceptional heart care, we have created designated Provider Care Teams.  These Care Teams include your primary Cardiologist (physician) and Advanced Practice Providers (APPs -  Physician Assistants and Nurse Practitioners) who all work together to provide you with the care you need, when you need it. You may see Buford Dresser, MD or one of the following Advanced Practice Providers on your designated Care Team:    Rosaria Ferries, PA-C  Jory Sims, DNP, ANP  Cadence Kathlen Mody, NP Your physician wants you to follow-up in: 1 month. You will receive a reminder letter in the mail two months in advance. If you don't receive a letter, please call our office to schedule the follow-up appointment.   Any Other Special Instructions Will Be Listed Below (If Applicable). You will have to have a Covid test prior to your treadmill test.        Signed, Buford Dresser, MD PhD 08/31/2019 5:52 PM    [place]

## 2019-09-04 DIAGNOSIS — E782 Mixed hyperlipidemia: Secondary | ICD-10-CM | POA: Insufficient documentation

## 2019-09-09 ENCOUNTER — Telehealth: Payer: Self-pay

## 2019-09-09 NOTE — Telephone Encounter (Signed)
Attempted to contact pt to schedule COVID test prior to GXT. Left message to call back.

## 2019-09-14 NOTE — Telephone Encounter (Signed)
Attempted to contact pt x 2 to scheduled COVID test. Left message to call back.

## 2019-09-15 NOTE — Telephone Encounter (Signed)
Returned call to daughter [facility]) scheduled appt for pre-procedure COVID testing for ETT on 11-20. Explained COVID testing procedure and guidelines and self-quarantine instructions. Verbalized understanding

## 2019-09-15 NOTE — Telephone Encounter (Signed)
  Daughter is returning call 

## 2019-09-20 ENCOUNTER — Telehealth: Payer: Self-pay

## 2019-09-20 ENCOUNTER — Other Ambulatory Visit (HOSPITAL_COMMUNITY)
Admission: RE | Admit: 2019-09-20 | Discharge: 2019-09-20 | Disposition: A | Discharge status: Home / Self Care | Payer: BC Managed Care – PPO | Source: Ambulatory Visit | Attending: Cardiology | Admitting: Cardiology

## 2019-09-20 ENCOUNTER — Telehealth (HOSPITAL_COMMUNITY): Payer: Self-pay

## 2019-09-20 ENCOUNTER — Inpatient Hospital Stay (HOSPITAL_COMMUNITY): Admission: RE | Admit: 2019-09-20 | Payer: BC Managed Care – PPO | Source: Ambulatory Visit

## 2019-09-20 DIAGNOSIS — Z20828 Contact with and (suspected) exposure to other viral communicable diseases: Secondary | ICD-10-CM | POA: Insufficient documentation

## 2019-09-20 DIAGNOSIS — Z01812 Encounter for preprocedural laboratory examination: Secondary | ICD-10-CM | POA: Insufficient documentation

## 2019-09-20 NOTE — Telephone Encounter (Signed)
Encounter complete. 

## 2019-09-20 NOTE — Telephone Encounter (Signed)
Incoming call from Mining engineer. She states pt daughter calling office to know why pt was advised at [place] site to go to pre-admission after her COVID-19 test. Spoke with pt daughter Asencion Partridge. Unable to find any reason on file for this. Asencion Partridge then stated that her sister returned from inside [facility] and was told that it was an error.   She inquired if pt will have a translator available for her ETT on 09/23/2019 at 3:30 pm. Consulted Satira Sark who confirmed that pt is set up in [employer] to have an interpreter at [place] locations. Pt daughter made aware and verbalized uderstanding

## 2019-09-21 LAB — NOVEL CORONAVIRUS, NAA (HOSP ORDER, SEND-OUT TO REF LAB; TAT 18-24 HRS): SARS-CoV-2, NAA: NOT DETECTED

## 2019-09-23 ENCOUNTER — Encounter (HOSPITAL_COMMUNITY): Payer: Self-pay | Admitting: *Deleted

## 2019-09-23 ENCOUNTER — Ambulatory Visit (HOSPITAL_COMMUNITY)
Admission: RE | Admit: 2019-09-23 | Discharge: 2019-09-23 | Disposition: A | Discharge status: Home / Self Care | Payer: BC Managed Care – PPO | Source: Ambulatory Visit | Attending: Cardiology | Admitting: Cardiology

## 2019-09-23 ENCOUNTER — Other Ambulatory Visit: Payer: Self-pay

## 2019-09-23 DIAGNOSIS — R079 Chest pain, unspecified: Secondary | ICD-10-CM | POA: Diagnosis not present

## 2019-09-23 LAB — EXERCISE TOLERANCE TEST
Estimated workload: 8.3 METS
Exercise duration (min): 6 min
Exercise duration (sec): 52 s
MPHR: 166 {beats}/min
Peak HR: 155 {beats}/min
Percent HR: 93 %
RPE: 17
Rest HR: 78 {beats}/min

## 2019-09-23 NOTE — Progress Notes (Signed)
Interpreter present during test [place] from [facility].

## 2019-09-28 ENCOUNTER — Other Ambulatory Visit: Payer: Self-pay

## 2019-09-28 ENCOUNTER — Encounter: Payer: Self-pay | Admitting: Cardiology

## 2019-09-28 ENCOUNTER — Ambulatory Visit: Payer: BC Managed Care – PPO | Admitting: Cardiology

## 2019-09-28 VITALS — BP 114/76 | HR 76 | Ht 63.0 in | Wt 195.4 lb

## 2019-09-28 DIAGNOSIS — Z7189 Other specified counseling: Secondary | ICD-10-CM

## 2019-09-28 DIAGNOSIS — R079 Chest pain, unspecified: Secondary | ICD-10-CM

## 2019-09-28 DIAGNOSIS — Z712 Person consulting for explanation of examination or test findings: Secondary | ICD-10-CM

## 2019-09-28 NOTE — Patient Instructions (Signed)
Medication Instructions:  Your Physician recommend you continue on your current medication as directed.    *If you need a refill on your cardiac medications before your next appointment, please call your pharmacy*  Lab Work: None  Testing/Procedures: None  Follow-Up: At [facility], you and your health needs are our priority.  As part of our continuing mission to provide you with exceptional heart care, we have created designated Provider Care Teams.  These Care Teams include your primary Cardiologist (physician) and Advanced Practice Providers (APPs -  Physician Assistants and Nurse Practitioners) who all work together to provide you with the care you need, when you need it.  Your next appointment:   2 year(s)  The format for your next appointment:   In Person  Provider:   Bridgette Christopher, MD   

## 2019-09-28 NOTE — Progress Notes (Signed)
Cardiology Office Note:    Date:  09/28/2019   ID:  ADDIS BENNIE, DOB Dec 25, 1965, MRN [id number]  PCP:  Horald Pollen, MD  Cardiologist:  Buford Dresser, MD  Referring MD: Horald Pollen, *   CC: follow up  History of Present Illness:    Kendra Rodriguez is a 53 y.o. female with a hx of GERD who is seen in follow up today. I initially met her 08/31/19 as a new consult at the request of [facility], * for the evaluation and management of chest pain.  Speaks some English, also here with her daughter who assists with translation as well.  Today: Chest pain unchanged. Reviewed results of ETT, which did not show any ischemia. She is reassured by this. She has felt that this was acid reflux and now that she knows her heart is good, plans to readdress with Dr. Mitchel Honour.   Past Medical History:  Diagnosis Date  . Acute sinusitis   . Allergy   . Anxiety   . Depression    NO MEDICATIONS - DOING OK NOW  . Dizziness   . Ear ache    RIGHT --HX OF MULTIPLE EAR INFECTIONS AS A CHILD   . GERD (gastroesophageal reflux disease)   . Headache(784.0)    MIGRAINES  . Leg pain, left    with swelling  . Lumbar stenosis    PAIN BACK AND LEFT LEG AND NUMBNESS LEG AND FOOT  . Peripheral vascular disease (HCC)    HX OF TREATMENT FOR VARICOSE VEINS RIGHT LEG  . Thyroid disease    TOLD BLOOD LEVELS SLIGHTLY ABNORMAL - BUT NOT REQUIRED MEDICATION    Past Surgical History:  Procedure Laterality Date  . ABDOMINAL HYSTERECTOMY    . bladder lift     AT SAME TIME OF HYSTERECTOMY  . CHOLECYSTECTOMY    . LUMBAR LAMINECTOMY/DECOMPRESSION MICRODISCECTOMY Left 10/24/2014   Procedure: COMPLETE LUMBAR DECOMPRESSION L4-L5 CENTRAL AND MICRODISCECTOMY L4-L5 LEFT;  Surgeon: Tobi Bastos, MD;  Location: [facility];  Service: Orthopedics;  Laterality: Left;    Current Medications: Current Outpatient Medications on File Prior to Visit  Medication Sig  . dexlansoprazole  (DEXILANT) 60 MG capsule Take 1 capsule (60 mg total) by mouth every morning.  . fluticasone (FLONASE) 50 MCG/ACT nasal spray Place 2 sprays into both nostrils daily.  . metFORMIN (GLUCOPHAGE) 850 MG tablet Take 1 tablet (850 mg total) by mouth 2 (two) times daily with a meal.   No current facility-administered medications on file prior to visit.      Allergies:   Patient has no known allergies.   Social History   Tobacco Use  . Smoking status: Never Smoker  . Smokeless tobacco: Never Used  Substance Use Topics  . Alcohol use: No  . Drug use: No    Family History: family history includes Arthritis in her mother, sister, and sister; Autoimmune disease in her brother; Diabetes in her mother; Gout in her son; High Cholesterol in her daughter; Hyperlipidemia in her mother; Hypertension in her mother; Lung disease in her brother; Rheum arthritis in her brother.  ROS:   Please see the history of present illness.  Additional pertinent ROS: Constitutional: Negative for chills, fever, night sweats, unintentional weight loss  HENT: Negative for ear pain and hearing loss.   Eyes: Negative for loss of vision and eye pain.  Respiratory: Negative for cough, sputum, wheezing.   Cardiovascular: See HPI. Gastrointestinal: Negative for abdominal pain, melena, and hematochezia.  Genitourinary: Negative for dysuria and hematuria.  Musculoskeletal: Negative for falls and myalgias.  Skin: Negative for itching and rash.  Neurological: Negative for focal weakness, focal sensory changes and loss of consciousness.  Endo/Heme/Allergies: Does not bruise/bleed easily.   EKGs/Labs/Other Studies Reviewed:    The following studies were reviewed today: ABI/duplex studies in 2012 ETT 09/23/19  Blood pressure demonstrated a normal response to exercise.  There was no ST segment deviation noted during stress.  No T wave inversion was noted during stress.  Negative, adequate stress test.  EKG:  EKG is  personally reviewed.  The ekg ordered 08/31/19 demonstrates NSR at 76 bpm  Recent Labs: 06/30/2019: ALT 12; BUN 15; Creatinine, Ser 0.70; Hemoglobin 12.9; Platelets 296; Potassium 4.0; Sodium 140  Recent Lipid Panel    Component Value Date/Time   CHOL 129 06/30/2019 0907   TRIG 138 06/30/2019 0907   HDL 51 06/30/2019 0907   CHOLHDL 2.5 06/30/2019 0907   CHOLHDL 2.9 10/17/2015 1026   VLDL 29 10/17/2015 1026   LDLCALC 50 06/30/2019 0907    Physical Exam:    VS:  BP 114/76   Pulse 76   Ht 5' 3"  (1.6 m)   Wt 195 lb 6.4 oz (88.6 kg)   SpO2 99%   BMI 34.61 kg/m     Wt Readings from Last 3 Encounters:  09/28/19 195 lb 6.4 oz (88.6 kg)  08/31/19 195 lb (88.5 kg)  08/19/19 196 lb (88.9 kg)    GEN: Well nourished, well developed in no acute distress HEENT: Normal, moist mucous membranes NECK: No JVD CARDIAC: regular rhythm, normal S1 and S2, no rubs or gallops. No murmur. VASCULAR: Radial and DP pulses 2+ bilaterally. No carotid bruits RESPIRATORY:  Clear to auscultation without rales, wheezing or rhonchi  ABDOMEN: Soft, non-tender, non-distended MUSCULOSKELETAL:  Ambulates independently SKIN: Warm and dry, no edema NEUROLOGIC:  Alert and oriented x 3. No focal neuro deficits noted. PSYCHIATRIC:  Normal affect   ASSESSMENT:    1. Encounter to discuss test results   2. Nonspecific chest pain   3. Cardiac risk counseling   4. Counseling on health promotion and disease prevention    PLAN:    Chest pain: treadmill stress reassuring. Discussed other etiologies for chest pain -plans to follow up with PCP  Hyperlipidemia, mixed:  -continue atorvastatin  Cardiac risk counseling and prevention recommendations: -recommend heart healthy/Mediterranean diet, with whole grains, fruits, vegetable, fish, lean meats, nuts, and olive oil. Limit salt. -recommend moderate walking, 3-5 times/week for 30-50 minutes each session. Aim for at least 150 minutes.week. Goal should be pace of 3  miles/hours, or walking 1.5 miles in 30 minutes -recommend avoidance of tobacco products. Avoid excess alcohol. -Additional risk factor control:  -Diabetes risk: prediabetic, on metformin  -Lipids:  As above  -Blood pressure control; on no meds, <140/90  -Weight:  BMI 34, would benefit from weight loss -ASCVD risk score: The ASCVD Risk score Mikey Bussing DC Jr., et al., 2013) failed to calculate for the following reasons:   The valid total cholesterol range is 130 to 320 mg/dL    Plan for follow up: 2 years for prevention  Medication Adjustments/Labs and Tests Ordered: Current medicines are reviewed at length with the patient today.  Concerns regarding medicines are outlined above.  No orders of the defined types were placed in this encounter.  No orders of the defined types were placed in this encounter.   Patient Instructions  Medication Instructions:  Your Physician recommend you continue  on your current medication as directed.    *If you need a refill on your cardiac medications before your next appointment, please call your pharmacy*  Lab Work: None  Testing/Procedures: None  Follow-Up: At [facility], you and your health needs are our priority.  As part of our continuing mission to provide you with exceptional heart care, we have created designated Provider Care Teams.  These Care Teams include your primary Cardiologist (physician) and Advanced Practice Providers (APPs -  Physician Assistants and Nurse Practitioners) who all work together to provide you with the care you need, when you need it.  Your next appointment:   2 year(s)  The format for your next appointment:   In Person  Provider:   Buford Dresser, MD     Signed, Buford Dresser, MD PhD 09/28/2019 3:20 PM    Kendra Rodriguez

## 2019-10-05 ENCOUNTER — Other Ambulatory Visit: Payer: Self-pay | Admitting: Emergency Medicine

## 2019-10-05 DIAGNOSIS — K219 Gastro-esophageal reflux disease without esophagitis: Secondary | ICD-10-CM

## 2019-11-01 ENCOUNTER — Ambulatory Visit: Payer: BLUE CROSS/BLUE SHIELD | Attending: Internal Medicine

## 2019-11-01 DIAGNOSIS — Z20828 Contact with and (suspected) exposure to other viral communicable diseases: Secondary | ICD-10-CM | POA: Insufficient documentation

## 2019-11-01 DIAGNOSIS — Z20822 Contact with and (suspected) exposure to covid-19: Secondary | ICD-10-CM

## 2019-11-02 LAB — NOVEL CORONAVIRUS, NAA: SARS-CoV-2, NAA: NOT DETECTED

## 2019-11-08 DIAGNOSIS — R1033 Periumbilical pain: Secondary | ICD-10-CM | POA: Diagnosis not present

## 2019-11-08 DIAGNOSIS — E669 Obesity, unspecified: Secondary | ICD-10-CM | POA: Diagnosis not present

## 2019-11-08 DIAGNOSIS — K219 Gastro-esophageal reflux disease without esophagitis: Secondary | ICD-10-CM | POA: Diagnosis not present

## 2019-11-10 ENCOUNTER — Encounter: Payer: Self-pay | Admitting: Emergency Medicine

## 2019-11-10 DIAGNOSIS — K219 Gastro-esophageal reflux disease without esophagitis: Secondary | ICD-10-CM | POA: Diagnosis not present

## 2019-11-10 DIAGNOSIS — R194 Change in bowel habit: Secondary | ICD-10-CM | POA: Diagnosis not present

## 2019-11-10 DIAGNOSIS — R1033 Periumbilical pain: Secondary | ICD-10-CM | POA: Diagnosis not present

## 2019-11-10 DIAGNOSIS — R140 Abdominal distension (gaseous): Secondary | ICD-10-CM | POA: Diagnosis not present

## 2019-11-24 ENCOUNTER — Encounter: Payer: Self-pay | Admitting: Emergency Medicine

## 2019-12-06 ENCOUNTER — Encounter: Payer: Self-pay | Admitting: Emergency Medicine

## 2019-12-06 ENCOUNTER — Telehealth (INDEPENDENT_AMBULATORY_CARE_PROVIDER_SITE_OTHER): Payer: BLUE CROSS/BLUE SHIELD | Admitting: Emergency Medicine

## 2019-12-06 ENCOUNTER — Other Ambulatory Visit: Payer: Self-pay

## 2019-12-06 ENCOUNTER — Telehealth: Payer: Self-pay | Admitting: *Deleted

## 2019-12-06 VITALS — Temp 97.2°F | Wt 196.0 lb

## 2019-12-06 DIAGNOSIS — Z8669 Personal history of other diseases of the nervous system and sense organs: Secondary | ICD-10-CM | POA: Diagnosis not present

## 2019-12-06 DIAGNOSIS — H538 Other visual disturbances: Secondary | ICD-10-CM | POA: Diagnosis not present

## 2019-12-06 NOTE — Telephone Encounter (Signed)
Left message in voice mail to call back to be triaged for telemed appt at 10:00 am. [employer] was used.

## 2019-12-06 NOTE — Progress Notes (Signed)
Telemedicine Encounter- SOAP NOTE Established Patient  This telephone encounter was conducted with the patient's (or proxy's) verbal consent via audio telecommunications: yes/no: Yes Patient was instructed to have this encounter in a suitably private space; and to only have persons present to whom they give permission to participate. In addition, patient identity was confirmed by use of name plus two identifiers (DOB and address).  I discussed the limitations, risks, security and privacy concerns of performing an evaluation and management service by telephone and the availability of in person appointments. I also discussed with the patient that there may be a patient responsible charge related to this service. The patient expressed understanding and agreed to proceed.  I spent a total of TIME; 0 MIN TO 60 MIN: 15 minutes talking with the patient or their proxy.  Chief Complaint  Patient presents with  . Blurred Vision    blurred vision for 10 day only in the left eye. no pain or no reddness in the eye    Subjective   Kendra Rodriguez is a 54 y.o. female established patient. Telephone visit today complaining of blurry vision from the left eye that started 10 days ago along with itching.  Denies redness or discharge.  Denies pain.  No injuries.  No other associated symptoms.  Has a history of migraine headaches and gets occasional unilateral headaches with photosensitivity and visual auras along with nausea and vomiting.  HPI   Patient Active Problem List   Diagnosis Date Noted  . History of migraine headaches 12/06/2019  . Mixed hyperlipidemia 09/04/2019  . Dyslipidemia 06/30/2019  . Nonspecific chest pain 06/30/2019  . GERD with esophagitis 06/30/2019  . Prediabetes 10/17/2015  . Hypertriglyceridemia 10/17/2015  . Spinal stenosis, lumbar region, with neurogenic claudication 10/24/2014    Past Medical History:  Diagnosis Date  . Acute sinusitis   . Allergy   . Anxiety   .  Depression    NO MEDICATIONS - DOING OK NOW  . Dizziness   . Ear ache    RIGHT --HX OF MULTIPLE EAR INFECTIONS AS A CHILD   . GERD (gastroesophageal reflux disease)   . Headache(784.0)    MIGRAINES  . Leg pain, left    with swelling  . Lumbar stenosis    PAIN BACK AND LEFT LEG AND NUMBNESS LEG AND FOOT  . Peripheral vascular disease (HCC)    HX OF TREATMENT FOR VARICOSE VEINS RIGHT LEG  . Thyroid disease    TOLD BLOOD LEVELS SLIGHTLY ABNORMAL - BUT NOT REQUIRED MEDICATION    Current Outpatient Medications  Medication Sig Dispense Refill  . DEXILANT 60 MG capsule TAKE 1 CAPSULE(60 MG) BY MOUTH EVERY MORNING 30 capsule 3  . fluticasone (FLONASE) 50 MCG/ACT nasal spray Place 2 sprays into both nostrils daily. 16 g 6  . metFORMIN (GLUCOPHAGE) 850 MG tablet Take 1 tablet (850 mg total) by mouth 2 (two) times daily with a meal. 180 tablet 3   No current facility-administered medications for this visit.    No Known Allergies  Social History   Socioeconomic History  . Marital status: Married    Spouse name: Not on file  . Number of children: Not on file  . Years of education: Not on file  . Highest education level: Not on file  Occupational History  . Not on file  Tobacco Use  . Smoking status: Never Smoker  . Smokeless tobacco: Never Used  Substance and Sexual Activity  . Alcohol use: No  .  Drug use: No  . Sexual activity: Yes  Other Topics Concern  . Not on file  Social History Narrative  . Not on file   Social Determinants of Health   Financial Resource Strain:   . Difficulty of Paying Living Expenses: Not on file  Food Insecurity:   . Worried About Charity fundraiser in the Last Year: Not on file  . Ran Out of Food in the Last Year: Not on file  Transportation Needs:   . Lack of Transportation (Medical): Not on file  . Lack of Transportation (Non-Medical): Not on file  Physical Activity:   . Days of Exercise per Week: Not on file  . Minutes of Exercise per  Session: Not on file  Stress:   . Feeling of Stress : Not on file  Social Connections:   . Frequency of Communication with Friends and Family: Not on file  . Frequency of Social Gatherings with Friends and Family: Not on file  . Attends Religious Services: Not on file  . Active Member of Clubs or Organizations: Not on file  . Attends Archivist Meetings: Not on file  . Marital Status: Not on file  Intimate Partner Violence:   . Fear of Current or Ex-Partner: Not on file  . Emotionally Abused: Not on file  . Physically Abused: Not on file  . Sexually Abused: Not on file    Review of Systems  Constitutional: Negative.  Negative for chills and fever.  HENT: Negative.  Negative for congestion and sore throat.   Eyes: Positive for blurred vision (Left eye). Negative for pain, discharge and redness.  Respiratory: Negative.  Negative for cough and shortness of breath.   Cardiovascular: Negative.  Negative for chest pain and palpitations.  Gastrointestinal: Negative.  Negative for abdominal pain, diarrhea, nausea and vomiting.  Genitourinary: Negative.   Musculoskeletal: Negative.  Negative for myalgias.  Skin: Negative.  Negative for rash.  Neurological: Negative.  Negative for dizziness and headaches.  Endo/Heme/Allergies: Negative.   All other systems reviewed and are negative.   Objective  Alert and oriented x3 in no apparent respiratory distress. Vitals as reported by the patient: Today's Vitals   12/06/19 1100  Temp: (!) 97.2 F (36.2 C)  Weight: 196 lb (88.9 kg)    Krystyn was seen today for blurred vision.  Diagnoses and all orders for this visit:  Blurred vision, left eye -     Ambulatory referral to Ophthalmology  History of migraine headaches  Clinically stable.  No red flag signs or symptoms.  Needs ophthalmology evaluation ASAP.   I discussed the assessment and treatment plan with the patient. The patient was provided an opportunity to ask questions  and all were answered. The patient agreed with the plan and demonstrated an understanding of the instructions.   The patient was advised to call back or seek an in-person evaluation if the symptoms worsen or if the condition fails to improve as anticipated.  I provided 15 minutes of non-face-to-face time during this encounter.  Horald Pollen, MD  Primary Care at [facility]

## 2019-12-08 ENCOUNTER — Telehealth: Payer: Self-pay | Admitting: Emergency Medicine

## 2019-12-08 NOTE — Telephone Encounter (Signed)
Received fax with blood results, CBC lipid profile and CMP.  We will scan these to chart. Discussed results with patient.

## 2019-12-12 DIAGNOSIS — H35363 Drusen (degenerative) of macula, bilateral: Secondary | ICD-10-CM | POA: Diagnosis not present

## 2019-12-12 DIAGNOSIS — H2513 Age-related nuclear cataract, bilateral: Secondary | ICD-10-CM | POA: Diagnosis not present

## 2019-12-12 DIAGNOSIS — D3132 Benign neoplasm of left choroid: Secondary | ICD-10-CM | POA: Diagnosis not present

## 2019-12-12 DIAGNOSIS — H35033 Hypertensive retinopathy, bilateral: Secondary | ICD-10-CM | POA: Diagnosis not present

## 2019-12-12 DIAGNOSIS — H35013 Changes in retinal vascular appearance, bilateral: Secondary | ICD-10-CM | POA: Diagnosis not present

## 2019-12-12 LAB — HM DIABETES EYE EXAM

## 2019-12-14 ENCOUNTER — Encounter: Payer: Self-pay | Admitting: *Deleted

## 2019-12-28 DIAGNOSIS — M9901 Segmental and somatic dysfunction of cervical region: Secondary | ICD-10-CM | POA: Diagnosis not present

## 2019-12-28 DIAGNOSIS — M9902 Segmental and somatic dysfunction of thoracic region: Secondary | ICD-10-CM | POA: Diagnosis not present

## 2019-12-28 DIAGNOSIS — M5412 Radiculopathy, cervical region: Secondary | ICD-10-CM | POA: Diagnosis not present

## 2019-12-28 DIAGNOSIS — M4124 Other idiopathic scoliosis, thoracic region: Secondary | ICD-10-CM | POA: Diagnosis not present

## 2019-12-29 DIAGNOSIS — M9902 Segmental and somatic dysfunction of thoracic region: Secondary | ICD-10-CM | POA: Diagnosis not present

## 2019-12-29 DIAGNOSIS — M9901 Segmental and somatic dysfunction of cervical region: Secondary | ICD-10-CM | POA: Diagnosis not present

## 2019-12-29 DIAGNOSIS — M5412 Radiculopathy, cervical region: Secondary | ICD-10-CM | POA: Diagnosis not present

## 2019-12-29 DIAGNOSIS — M4124 Other idiopathic scoliosis, thoracic region: Secondary | ICD-10-CM | POA: Diagnosis not present

## 2020-01-02 ENCOUNTER — Ambulatory Visit: Payer: Self-pay | Attending: Internal Medicine

## 2020-01-02 DIAGNOSIS — Z23 Encounter for immunization: Secondary | ICD-10-CM | POA: Insufficient documentation

## 2020-01-02 NOTE — Progress Notes (Signed)
   Covid-19 Vaccination Clinic  Name:  Kendra Rodriguez    MRN: [id number] DOB: January 05, 1966  01/02/2020  [name] was observed post Covid-19 immunization for 15 minutes without incidence. She was provided with Vaccine Information Sheet and instruction to access the V-Safe system.   [name] was instructed to call 911 with any severe reactions post vaccine: Marland Kitchen Difficulty breathing  . Swelling of your face and throat  . A fast heartbeat  . A bad rash all over your body  . Dizziness and weakness    Immunizations Administered    Name Date Dose VIS Date Route   Pfizer COVID-19 Vaccine 01/02/2020 12:02 PM 0.3 mL 10/14/2019 Intramuscular   Manufacturer: [employer], [employer]   Lot: [id number]   NDC: [address]-0

## 2020-01-03 DIAGNOSIS — M9902 Segmental and somatic dysfunction of thoracic region: Secondary | ICD-10-CM | POA: Diagnosis not present

## 2020-01-03 DIAGNOSIS — M4124 Other idiopathic scoliosis, thoracic region: Secondary | ICD-10-CM | POA: Diagnosis not present

## 2020-01-03 DIAGNOSIS — M9901 Segmental and somatic dysfunction of cervical region: Secondary | ICD-10-CM | POA: Diagnosis not present

## 2020-01-03 DIAGNOSIS — M5412 Radiculopathy, cervical region: Secondary | ICD-10-CM | POA: Diagnosis not present

## 2020-01-04 DIAGNOSIS — M5412 Radiculopathy, cervical region: Secondary | ICD-10-CM | POA: Diagnosis not present

## 2020-01-04 DIAGNOSIS — M9901 Segmental and somatic dysfunction of cervical region: Secondary | ICD-10-CM | POA: Diagnosis not present

## 2020-01-04 DIAGNOSIS — M9902 Segmental and somatic dysfunction of thoracic region: Secondary | ICD-10-CM | POA: Diagnosis not present

## 2020-01-04 DIAGNOSIS — M4124 Other idiopathic scoliosis, thoracic region: Secondary | ICD-10-CM | POA: Diagnosis not present

## 2020-01-06 DIAGNOSIS — M4124 Other idiopathic scoliosis, thoracic region: Secondary | ICD-10-CM | POA: Diagnosis not present

## 2020-01-06 DIAGNOSIS — M9902 Segmental and somatic dysfunction of thoracic region: Secondary | ICD-10-CM | POA: Diagnosis not present

## 2020-01-06 DIAGNOSIS — M5412 Radiculopathy, cervical region: Secondary | ICD-10-CM | POA: Diagnosis not present

## 2020-01-06 DIAGNOSIS — M9901 Segmental and somatic dysfunction of cervical region: Secondary | ICD-10-CM | POA: Diagnosis not present

## 2020-01-09 DIAGNOSIS — M5412 Radiculopathy, cervical region: Secondary | ICD-10-CM | POA: Diagnosis not present

## 2020-01-09 DIAGNOSIS — M4124 Other idiopathic scoliosis, thoracic region: Secondary | ICD-10-CM | POA: Diagnosis not present

## 2020-01-09 DIAGNOSIS — M9902 Segmental and somatic dysfunction of thoracic region: Secondary | ICD-10-CM | POA: Diagnosis not present

## 2020-01-09 DIAGNOSIS — M9901 Segmental and somatic dysfunction of cervical region: Secondary | ICD-10-CM | POA: Diagnosis not present

## 2020-01-12 DIAGNOSIS — M9902 Segmental and somatic dysfunction of thoracic region: Secondary | ICD-10-CM | POA: Diagnosis not present

## 2020-01-12 DIAGNOSIS — M9901 Segmental and somatic dysfunction of cervical region: Secondary | ICD-10-CM | POA: Diagnosis not present

## 2020-01-12 DIAGNOSIS — M4124 Other idiopathic scoliosis, thoracic region: Secondary | ICD-10-CM | POA: Diagnosis not present

## 2020-01-12 DIAGNOSIS — M5412 Radiculopathy, cervical region: Secondary | ICD-10-CM | POA: Diagnosis not present

## 2020-01-13 DIAGNOSIS — M9901 Segmental and somatic dysfunction of cervical region: Secondary | ICD-10-CM | POA: Diagnosis not present

## 2020-01-13 DIAGNOSIS — M5412 Radiculopathy, cervical region: Secondary | ICD-10-CM | POA: Diagnosis not present

## 2020-01-13 DIAGNOSIS — M4124 Other idiopathic scoliosis, thoracic region: Secondary | ICD-10-CM | POA: Diagnosis not present

## 2020-01-13 DIAGNOSIS — M9902 Segmental and somatic dysfunction of thoracic region: Secondary | ICD-10-CM | POA: Diagnosis not present

## 2020-01-16 DIAGNOSIS — M4124 Other idiopathic scoliosis, thoracic region: Secondary | ICD-10-CM | POA: Diagnosis not present

## 2020-01-16 DIAGNOSIS — M9902 Segmental and somatic dysfunction of thoracic region: Secondary | ICD-10-CM | POA: Diagnosis not present

## 2020-01-16 DIAGNOSIS — M5412 Radiculopathy, cervical region: Secondary | ICD-10-CM | POA: Diagnosis not present

## 2020-01-16 DIAGNOSIS — M9901 Segmental and somatic dysfunction of cervical region: Secondary | ICD-10-CM | POA: Diagnosis not present

## 2020-01-19 DIAGNOSIS — M9901 Segmental and somatic dysfunction of cervical region: Secondary | ICD-10-CM | POA: Diagnosis not present

## 2020-01-19 DIAGNOSIS — M5412 Radiculopathy, cervical region: Secondary | ICD-10-CM | POA: Diagnosis not present

## 2020-01-19 DIAGNOSIS — M4124 Other idiopathic scoliosis, thoracic region: Secondary | ICD-10-CM | POA: Diagnosis not present

## 2020-01-19 DIAGNOSIS — M9902 Segmental and somatic dysfunction of thoracic region: Secondary | ICD-10-CM | POA: Diagnosis not present

## 2020-01-24 DIAGNOSIS — M9901 Segmental and somatic dysfunction of cervical region: Secondary | ICD-10-CM | POA: Diagnosis not present

## 2020-01-24 DIAGNOSIS — M4124 Other idiopathic scoliosis, thoracic region: Secondary | ICD-10-CM | POA: Diagnosis not present

## 2020-01-24 DIAGNOSIS — M5412 Radiculopathy, cervical region: Secondary | ICD-10-CM | POA: Diagnosis not present

## 2020-01-24 DIAGNOSIS — M9902 Segmental and somatic dysfunction of thoracic region: Secondary | ICD-10-CM | POA: Diagnosis not present

## 2020-01-25 ENCOUNTER — Ambulatory Visit: Payer: Self-pay | Attending: Internal Medicine

## 2020-01-25 DIAGNOSIS — Z23 Encounter for immunization: Secondary | ICD-10-CM

## 2020-01-25 NOTE — Progress Notes (Signed)
   Covid-19 Vaccination Clinic  Name:  Kendra Rodriguez    MRN: [id number] DOB: [birth year]  01/25/2020  [name] was observed post Covid-19 immunization for 15 minutes without incident. She was provided with Vaccine Information Sheet and instruction to access the V-Safe system.   [name] was instructed to call 911 with any severe reactions post vaccine: Marland Kitchen Difficulty breathing  . Swelling of face and throat  . A fast heartbeat  . A bad rash all over body  . Dizziness and weakness   Immunizations Administered    Name Date Dose VIS Date Route   Pfizer COVID-19 Vaccine 01/25/2020 11:48 AM 0.3 mL 10/14/2019 Intramuscular   Manufacturer: [employer], [employer]   Lot: [id number]   NDC: [address]-9

## 2020-01-27 DIAGNOSIS — M4124 Other idiopathic scoliosis, thoracic region: Secondary | ICD-10-CM | POA: Diagnosis not present

## 2020-01-27 DIAGNOSIS — M9902 Segmental and somatic dysfunction of thoracic region: Secondary | ICD-10-CM | POA: Diagnosis not present

## 2020-01-27 DIAGNOSIS — M5412 Radiculopathy, cervical region: Secondary | ICD-10-CM | POA: Diagnosis not present

## 2020-01-27 DIAGNOSIS — M9901 Segmental and somatic dysfunction of cervical region: Secondary | ICD-10-CM | POA: Diagnosis not present

## 2020-01-31 DIAGNOSIS — M9902 Segmental and somatic dysfunction of thoracic region: Secondary | ICD-10-CM | POA: Diagnosis not present

## 2020-01-31 DIAGNOSIS — M4124 Other idiopathic scoliosis, thoracic region: Secondary | ICD-10-CM | POA: Diagnosis not present

## 2020-01-31 DIAGNOSIS — M9901 Segmental and somatic dysfunction of cervical region: Secondary | ICD-10-CM | POA: Diagnosis not present

## 2020-01-31 DIAGNOSIS — M5412 Radiculopathy, cervical region: Secondary | ICD-10-CM | POA: Diagnosis not present

## 2020-02-06 DIAGNOSIS — M9901 Segmental and somatic dysfunction of cervical region: Secondary | ICD-10-CM | POA: Diagnosis not present

## 2020-02-06 DIAGNOSIS — M9902 Segmental and somatic dysfunction of thoracic region: Secondary | ICD-10-CM | POA: Diagnosis not present

## 2020-02-06 DIAGNOSIS — M5412 Radiculopathy, cervical region: Secondary | ICD-10-CM | POA: Diagnosis not present

## 2020-02-06 DIAGNOSIS — M4124 Other idiopathic scoliosis, thoracic region: Secondary | ICD-10-CM | POA: Diagnosis not present

## 2020-02-08 DIAGNOSIS — M5412 Radiculopathy, cervical region: Secondary | ICD-10-CM | POA: Diagnosis not present

## 2020-02-08 DIAGNOSIS — M4124 Other idiopathic scoliosis, thoracic region: Secondary | ICD-10-CM | POA: Diagnosis not present

## 2020-02-08 DIAGNOSIS — M9902 Segmental and somatic dysfunction of thoracic region: Secondary | ICD-10-CM | POA: Diagnosis not present

## 2020-02-08 DIAGNOSIS — M9901 Segmental and somatic dysfunction of cervical region: Secondary | ICD-10-CM | POA: Diagnosis not present

## 2020-02-10 DIAGNOSIS — M5412 Radiculopathy, cervical region: Secondary | ICD-10-CM | POA: Diagnosis not present

## 2020-02-10 DIAGNOSIS — M4124 Other idiopathic scoliosis, thoracic region: Secondary | ICD-10-CM | POA: Diagnosis not present

## 2020-02-10 DIAGNOSIS — M9902 Segmental and somatic dysfunction of thoracic region: Secondary | ICD-10-CM | POA: Diagnosis not present

## 2020-02-10 DIAGNOSIS — M9901 Segmental and somatic dysfunction of cervical region: Secondary | ICD-10-CM | POA: Diagnosis not present

## 2020-03-01 ENCOUNTER — Encounter: Payer: Self-pay | Admitting: Emergency Medicine

## 2020-03-02 ENCOUNTER — Encounter: Payer: Self-pay | Admitting: Family Medicine

## 2020-03-02 ENCOUNTER — Ambulatory Visit (INDEPENDENT_AMBULATORY_CARE_PROVIDER_SITE_OTHER): Payer: BLUE CROSS/BLUE SHIELD | Admitting: Family Medicine

## 2020-03-02 ENCOUNTER — Other Ambulatory Visit: Payer: Self-pay

## 2020-03-02 VITALS — BP 124/72 | HR 72 | Temp 97.9°F | Ht 63.0 in | Wt 187.0 lb

## 2020-03-02 DIAGNOSIS — J0140 Acute pansinusitis, unspecified: Secondary | ICD-10-CM

## 2020-03-02 DIAGNOSIS — J301 Allergic rhinitis due to pollen: Secondary | ICD-10-CM | POA: Diagnosis not present

## 2020-03-02 MED ORDER — TRIAMCINOLONE ACETONIDE 55 MCG/ACT NA AERO: 2.0000 | INHALATION_SPRAY | Freq: Every day | NASAL | 12 refills | Status: DC

## 2020-03-02 MED ORDER — AMOXICILLIN-POT CLAVULANATE 875-125 MG PO TABS: 1.0000 | ORAL_TABLET | Freq: Two times a day (BID) | ORAL | 0 refills | Status: DC

## 2020-03-02 NOTE — Progress Notes (Signed)
4/30/20218:24 AM  Kendra Rodriguez [birth year], 54 y.o., female [id number]  Chief Complaint  Patient presents with  . R ear pain    , R side throat and temple pain x 1 week    HPI:   Patient is a 54 y.o. female who presents today for right ear pain x 1 week  Right ear stabbing pain with occ sense of hollow feeling, occ dizziness Radiates down into her throat, feels sore No swollen glands or problems swallowing Having nasal congestion, worse on right Watery itchy eyes No fever or chills Having right sided sinus pressure Has been using claritin, ran out of flonase about 2 weeks ago flonase causes burning sensation Has known seasonal allergies Does not smoke No sinus surgeries   Depression screen [facility] 2/9 12/06/2019 06/30/2019 06/02/2019  Decreased Interest 0 0 0  Down, Depressed, Hopeless 0 0 0  PHQ - 2 Score 0 0 0    Fall Risk  12/06/2019 06/30/2019 06/02/2019 06/25/2017 05/29/2016  Falls in the past year? 0 0 0 No Yes  Number falls in past yr: 0 - - - 2 or more  Injury with Fall? 0 - - - No  Follow up - Falls evaluation completed Falls evaluation completed - -     No Known Allergies  Prior to Admission medications   Medication Sig Start Date End Date Taking? Authorizing Provider  atorvastatin (LIPITOR) 40 MG tablet  12/25/19  Yes [provider]  DEXILANT 60 MG capsule TAKE 1 CAPSULE(60 MG) BY MOUTH EVERY MORNING 10/05/19  Yes Sagardia, Eilleen Kempf, MD  fluticasone [facility]) 50 MCG/ACT nasal spray Place 2 sprays into both nostrils daily. 02/20/19  Yes Daphine Deutscher, Mary-Margaret, FNP  metFORMIN (GLUCOPHAGE) 850 MG tablet Take 1 tablet (850 mg total) by mouth 2 (two) times daily with a meal. 06/30/19 03/02/20 Yes Sagardia, Eilleen Kempf, MD  sucralfate (CARAFATE) 1 GM/10ML suspension  01/06/20  Yes [provider]    Past Medical History:  Diagnosis Date  . Acute sinusitis   . Allergy   . Anxiety   . Depression    NO MEDICATIONS - DOING OK NOW  . Dizziness   . Ear  ache    RIGHT --HX OF MULTIPLE EAR INFECTIONS AS A CHILD   . GERD (gastroesophageal reflux disease)   . Headache(784.0)    MIGRAINES  . Leg pain, left    with swelling  . Lumbar stenosis    PAIN BACK AND LEFT LEG AND NUMBNESS LEG AND FOOT  . Peripheral vascular disease (HCC)    HX OF TREATMENT FOR VARICOSE VEINS RIGHT LEG  . Thyroid disease    TOLD BLOOD LEVELS SLIGHTLY ABNORMAL - BUT NOT REQUIRED MEDICATION    Past Surgical History:  Procedure Laterality Date  . ABDOMINAL HYSTERECTOMY    . bladder lift     AT SAME TIME OF HYSTERECTOMY  . CHOLECYSTECTOMY    . LUMBAR LAMINECTOMY/DECOMPRESSION MICRODISCECTOMY Left 10/24/2014   Procedure: COMPLETE LUMBAR DECOMPRESSION L4-L5 CENTRAL AND MICRODISCECTOMY L4-L5 LEFT;  Surgeon: Jacki Cones, MD;  Location: [facility];  Service: Orthopedics;  Laterality: Left;    Social History   Tobacco Use  . Smoking status: Never Smoker  . Smokeless tobacco: Never Used  Substance Use Topics  . Alcohol use: No    Family History  Problem Relation Age of Onset  . Hypertension Mother   . Arthritis Mother   . Diabetes Mother   . Hyperlipidemia Mother   . Arthritis Sister   .  Rheum arthritis Brother   . Autoimmune disease Brother   . Lung disease Brother   . Arthritis Sister   . Gout Son   . High Cholesterol Daughter     ROS Per hpi  OBJECTIVE:  Today's Vitals   03/02/20 0819 03/02/20 0825  BP: (!) 161/91 124/72  Pulse: 72   Temp: 97.9 F (36.6 C)   SpO2: 94%   Weight: 187 lb (84.8 kg)   Height: 5\' 3"  (1.6 m)    Body mass index is 33.13 kg/m.   Physical Exam Vitals and nursing note reviewed.  Constitutional:      Appearance: She is well-developed.  HENT:     Head: Normocephalic and atraumatic.     Right Ear: Hearing, tympanic membrane, ear canal and external ear normal.     Left Ear: Hearing, tympanic membrane, ear canal and external ear normal.     Nose:     Right Turbinates: Not enlarged (erythematous with purulent  drainage).     Left Turbinates: Not enlarged or pale.     Right Sinus: Maxillary sinus tenderness and frontal sinus tenderness present.     Left Sinus: Maxillary sinus tenderness and frontal sinus tenderness present.  Eyes:     Extraocular Movements: Extraocular movements intact.     Conjunctiva/sclera: Conjunctivae normal.     Pupils: Pupils are equal, round, and reactive to light.  Cardiovascular:     Rate and Rhythm: Normal rate and regular rhythm.     Heart sounds: Normal heart sounds. No murmur. No friction rub. No gallop.   Pulmonary:     Effort: Pulmonary effort is normal.     Breath sounds: Normal breath sounds. No wheezing, rhonchi or rales.  Musculoskeletal:     Cervical back: Neck supple.  Lymphadenopathy:     Cervical: No cervical adenopathy.  Skin:    General: Skin is warm and dry.  Neurological:     Mental Status: She is alert and oriented to person, place, and time.     No results found for this or any previous visit (from the past 24 hour(s)).  No results found.   ASSESSMENT and PLAN  1. Acute non-recurrent pansinusitis 2. Seasonal allergic rhinitis due to pollen  Discussed supportive measures, new meds r/se/b and RTC precautions. Patient educational handout given.  - amoxicillin-clavulanate (AUGMENTIN) 875-125 MG tablet; Take 1 tablet by mouth 2 (two) times daily. - triamcinolone (NASACORT) 55 MCG/ACT AERO nasal inhaler; Place 2 sprays into the nose daily.  Return if symptoms worsen or fail to improve.    Rutherford Guys, MD Primary Care at [place], [place] [address] Ph.  [phone] Fax [phone]

## 2020-03-02 NOTE — Patient Instructions (Addendum)
   If you have lab work done today you will be contacted with your lab results within the next 2 weeks.  If you have not heard from us then please contact us. The fastest way to get your results is to register for My Chart.   IF you received an x-ray today, you will receive an invoice from [facility]. Please contact [facility] at [phone] with questions or concerns regarding your invoice.   IF you received labwork today, you will receive an invoice from [facility]. Please contact [facility] at [phone] with questions or concerns regarding your invoice.   Our billing staff will not be able to assist you with questions regarding bills from these companies.  You will be contacted with the lab results as soon as they are available. The fastest way to get your results is to activate your My Chart account. Instructions are located on the last page of this paperwork. If you have not heard from us regarding the results in 2 weeks, please contact this office.     Sinusitis, en adultos Sinusitis, Adult La sinusitis es el dolor y la hinchazn (inflamacin) de los senos paranasales. Los senos paranasales son espacios vacos en los huesos alrededor del rostro. Estos se encuentran en los siguientes lugares:  Alrededor de los ojos.  En la mitad de la frente.  Detrs de la nariz.  En los pmulos. Los senos paranasales y las fosas nasales estn cubiertos de un lquido llamado mucosidad. La mucosidad drena a travs de los senos paranasales. La hinchazn puede atrapar mucosidad en los senos paranasales. Esto permite que se desarrollen grmenes (bacterias, virus u hongos), lo que produce infecciones. La mayora de las veces, la causa de esta afeccin es un virus. Cules son las causas? Las causas de esta afeccin son:  Alergias.  Asma.  Grmenes.  Objetos que obstruyen la nariz o los senos paranasales.  Crecimientos en el interior de la nariz (plipos  nasales).  Sustancias qumicas o irritantes que estn presentes en el aire.  Hongos (poco frecuente). Qu incrementa el riesgo? Es ms probable que contraiga esta afeccin si:  Tiene debilitado el sistema de defensa del organismo (sistema inmunitario).  Nada o bucea mucho.  Usa aerosoles nasales en exceso.  Fuma. Cules son los signos o los sntomas? Los principales sntomas de esta afeccin son dolor y sensacin de presin alrededor de los senos paranasales. Otros sntomas pueden incluir los siguientes:  Nariz tapada (congestin nasal).  Goteo nasal (drenaje).  Hinchazn y calor en los senos paranasales.  Dolor de cabeza.  Dolor dental.  Tos que puede empeorar por la noche.  Mucosidad que se acumula en la garganta o la parte posterior de la nariz (goteo posnasal).  Incapacidad de sentir olores y sabores.  Estar muy cansado (fatiga).  Fiebre.  Dolor de garganta.  Mal aliento. Cmo se diagnostica? Esta afeccin se diagnostica en funcin de lo siguiente:  Sus sntomas.  Sus antecedentes mdicos.  Un examen fsico.  Pruebas para averiguar si la afeccin es de corta duracin (aguda) o de larga duracin (crnica). El mdico puede: ? Revisarle la nariz para detectar crecimientos (plipos). ? Revisarle los senos paranasales con una herramienta que tiene una luz (endoscopio). ? Revisar si tiene alergias o grmenes. ? Hacerle pruebas de diagnstico por imgenes, como una resonancia magntica (RM) o exploracin por tomografa computarizada (TC). Cmo se trata? El tratamiento de esta afeccin depende de la causa y si es de corta o de larga duracin.  Si la   causa es un virus, los sntomas deberan desaparecer solos en el trmino de 10das. Pueden darle medicamentos para aliviar los sntomas. Entre ellos, se incluyen los siguientes: ? Medicamentos para encoger el tejido inflamado de la nariz. ? Medicamentos para tratar alergias (antihistamnicos). ? Un aerosol  para tratar la hinchazn de las fosas nasales. ? Enjuagues que ayudan a eliminar la mucosidad espesa de la nariz (lavados con solucin salina nasal).  Si la causa es una bacteria, es posible que el mdico espere para averiguar si usted mejora sin tratamiento. Es posible que le den un antibitico si usted tiene: ? Una infeccin grave. ? El sistema de defensa del organismo debilitado.  Si la causa son crecimientos en la nariz, es posible que necesite una ciruga. Siga estas indicaciones en su casa: Medicamentos  Tome, use o aplique los medicamentos de venta libre y los recetados solamente como se lo haya indicado el mdico. Estos pueden incluir aerosoles nasales.  Si le recetaron un antibitico, tmelo como se lo haya indicado el mdico. No deje de tomar los antibiticos aunque comience a sentirse mejor. Hidrtese y humidifique los ambientes   Beba suficiente agua para mantener el pis (la orina) de color amarillo plido.  Use un humidificador de vapor fro para mantener la humedad de su hogar por encima del 50%.  Inhale vapor durante 10a 15minutos, de 3a 4veces al da, o como se lo haya indicado el mdico. Puede hacer esto en el bao con el vapor del agua caliente de la ducha.  Trate de no exponerse al aire fro o seco. Reposo  Descanse todo lo posible.  Duerma con la cabeza levantada (elevada).  Asegrese de dormir lo suficiente cada noche. Indicaciones generales   Pngase un pao caliente y hmedo en el rostro 3 a 4 veces al da, o con la frecuencia indicada por el mdico. Esto ayuda a calmar las molestias.  Lvese las manos frecuentemente con agua y jabn. Use un desinfectante para manos si no dispone de agua y jabn.  No fume. Evite estar cerca de personas que fuman (fumador pasivo).  Concurra a todas las visitas de seguimiento como se lo haya indicado el mdico. Esto es importante. Comunquese con un mdico si:  Tiene fiebre.  Sus sntomas empeoran.  Los  sntomas no mejoran en el perodo de 10das. Solicite ayuda inmediatamente si:  Tiene un dolor de cabeza muy intenso.  No puede dejar de vomitar.  Tiene dolor muy intenso o hinchazn en la zona del rostro o los ojos.  Tiene dificultad para ver.  Se siente confundido.  Tiene el cuello rgido.  Tiene dificultad para respirar. Resumen  La sinusitis es la hinchazn de los senos paranasales. Los senos paranasales son espacios vacos en los huesos alrededor del rostro.  La causa de esta afeccin es la inflamacin o hinchazn de los tejidos que estn en el interior de la nariz. Esto hace que los grmenes queden atrapados. Los grmenes pueden provocar infecciones.  Si le recetaron un antibitico, tmelo como se lo haya indicado el mdico. No deje de tomarlo aunque comience a sentirse mejor.  Concurra a todas las visitas de seguimiento como se lo haya indicado el mdico. Esto es importante. Esta informacin no tiene como fin reemplazar el consejo del mdico. Asegrese de hacerle al mdico cualquier pregunta que tenga. Document Revised: 05/05/2018 Document Reviewed: 05/05/2018 Elsevier Patient Education  2020 [employer]  

## 2020-03-06 ENCOUNTER — Ambulatory Visit: Payer: BLUE CROSS/BLUE SHIELD | Admitting: Emergency Medicine

## 2020-03-20 DIAGNOSIS — H04123 Dry eye syndrome of bilateral lacrimal glands: Secondary | ICD-10-CM | POA: Diagnosis not present

## 2020-03-20 DIAGNOSIS — D3132 Benign neoplasm of left choroid: Secondary | ICD-10-CM | POA: Diagnosis not present

## 2020-06-04 ENCOUNTER — Encounter: Payer: Self-pay | Admitting: Emergency Medicine

## 2020-06-04 ENCOUNTER — Other Ambulatory Visit: Payer: Self-pay

## 2020-06-04 ENCOUNTER — Ambulatory Visit: Payer: BLUE CROSS/BLUE SHIELD | Admitting: Emergency Medicine

## 2020-06-04 VITALS — BP 132/80 | HR 72 | Temp 98.0°F | Ht 62.0 in | Wt 192.0 lb

## 2020-06-04 DIAGNOSIS — Z6835 Body mass index (BMI) 35.0-35.9, adult: Secondary | ICD-10-CM | POA: Diagnosis not present

## 2020-06-04 DIAGNOSIS — G43819 Other migraine, intractable, without status migrainosus: Secondary | ICD-10-CM

## 2020-06-04 DIAGNOSIS — R7303 Prediabetes: Secondary | ICD-10-CM

## 2020-06-04 DIAGNOSIS — E785 Hyperlipidemia, unspecified: Secondary | ICD-10-CM | POA: Diagnosis not present

## 2020-06-04 DIAGNOSIS — R1031 Right lower quadrant pain: Secondary | ICD-10-CM | POA: Diagnosis not present

## 2020-06-04 DIAGNOSIS — R531 Weakness: Secondary | ICD-10-CM

## 2020-06-04 LAB — POCT URINALYSIS DIP (MANUAL ENTRY)
Bilirubin, UA: NEGATIVE
Glucose, UA: NEGATIVE mg/dL
Ketones, POC UA: NEGATIVE mg/dL
Leukocytes, UA: NEGATIVE
Nitrite, UA: NEGATIVE
Protein Ur, POC: NEGATIVE mg/dL
Spec Grav, UA: 1.025 (ref 1.010–1.025)
Urobilinogen, UA: 0.2 E.U./dL
pH, UA: 6.5 (ref 5.0–8.0)

## 2020-06-04 MED ORDER — SUMATRIPTAN SUCCINATE 50 MG PO TABS: ORAL_TABLET | ORAL | 1 refills | Status: DC

## 2020-06-04 NOTE — Progress Notes (Signed)
Kendra Rodriguez 54 y.o.   Chief Complaint  Patient presents with  . Headache    Chronic HA   . Abdominal Pain    for a week     HISTORY OF PRESENT ILLNESS: This is a 55 y.o. female complaining of 2 problems: 1.  Headaches, daily for the past 1-1/2 weeks.  Mostly left-sided, pressure across head, with photo and phono sensitivity, nausea, and light halos sometimes preceded by aura.  Also feels tired most of the time.  No history of diagnosed migraine headaches.  Saw optometrist last month and told she needs reading glasses.  Also had "shadow" in the back of the right eye.  Monitor every 3 months. Postmenopausal.  Status post hysterectomy 10 years ago secondary to prolapsed uterus.  Has appointment to see gynecologist next month. 2.  Right-sided lower abdominal pain for the past week.  Has history of the same in the past.  Denies urinary symptoms.  Able to eat and drink.  Denies diarrhea or blood in the stools.  Denies fever or chills.  No history of kidney stones.  No history of appendectomy.  History of cholecystectomy.  No other associated symptoms. Fully vaccinated against Covid. No other complaints or medical concerns today.  HPI   Prior to Admission medications   Medication Sig Start Date End Date Taking? Authorizing Provider  atorvastatin (LIPITOR) 40 MG tablet  12/25/19  Yes [provider]  metFORMIN (GLUCOPHAGE) 850 MG tablet Take 1 tablet (850 mg total) by mouth 2 (two) times daily with a meal. 06/30/19 06/04/20 Yes Addisynn Vassell, Eilleen Kempf, MD  sucralfate (CARAFATE) 1 GM/10ML suspension  01/06/20  Yes [provider]  triamcinolone (NASACORT) 55 MCG/ACT AERO nasal inhaler Place 2 sprays into the nose daily. 03/02/20  Yes Myles Lipps, MD  DEXILANT 60 MG capsule TAKE 1 CAPSULE(60 MG) BY MOUTH EVERY MORNING Patient not taking: Reported on 06/04/2020 10/05/19   [name], MD    No Known Allergies  Patient Active Problem List   Diagnosis Date Noted  .  History of migraine headaches 12/06/2019  . Mixed hyperlipidemia 09/04/2019  . Dyslipidemia 06/30/2019  . GERD with esophagitis 06/30/2019  . Prediabetes 10/17/2015  . Hypertriglyceridemia 10/17/2015  . Spinal stenosis, lumbar region, with neurogenic claudication 10/24/2014    Past Medical History:  Diagnosis Date  . Acute sinusitis   . Allergy   . Anxiety   . Depression    NO MEDICATIONS - DOING OK NOW  . Dizziness   . Ear ache    RIGHT --HX OF MULTIPLE EAR INFECTIONS AS A CHILD   . GERD (gastroesophageal reflux disease)   . Headache(784.0)    MIGRAINES  . Leg pain, left    with swelling  . Lumbar stenosis    PAIN BACK AND LEFT LEG AND NUMBNESS LEG AND FOOT  . Peripheral vascular disease (HCC)    HX OF TREATMENT FOR VARICOSE VEINS RIGHT LEG  . Thyroid disease    TOLD BLOOD LEVELS SLIGHTLY ABNORMAL - BUT NOT REQUIRED MEDICATION    Past Surgical History:  Procedure Laterality Date  . ABDOMINAL HYSTERECTOMY    . bladder lift     AT SAME TIME OF HYSTERECTOMY  . CHOLECYSTECTOMY    . LUMBAR LAMINECTOMY/DECOMPRESSION MICRODISCECTOMY Left 10/24/2014   Procedure: COMPLETE LUMBAR DECOMPRESSION L4-L5 CENTRAL AND MICRODISCECTOMY L4-L5 LEFT;  Surgeon: Jacki Cones, MD;  Location: [facility];  Service: Orthopedics;  Laterality: Left;    Social History   Socioeconomic History  .  Marital status: Married    Spouse name: Not on file  . Number of children: Not on file  . Years of education: Not on file  . Highest education level: Not on file  Occupational History  . Not on file  Tobacco Use  . Smoking status: Never Smoker  . Smokeless tobacco: Never Used  Vaping Use  . Vaping Use: Never used  Substance and Sexual Activity  . Alcohol use: No  . Drug use: No  . Sexual activity: Yes  Other Topics Concern  . Not on file  Social History Narrative  . Not on file   Social Determinants of Health   Financial Resource Strain:   . Difficulty of Paying Living Expenses:   Food  Insecurity:   . Worried About Programme researcher, broadcasting/film/videounning Out of Food in the Last Year:   . Baristaan Out of Food in the Last Year:   Transportation Needs:   . Freight forwarderLack of Transportation (Medical):   Marland Kitchen. Lack of Transportation (Non-Medical):   Physical Activity:   . Days of Exercise per Week:   . Minutes of Exercise per Session:   Stress:   . Feeling of Stress :   Social Connections:   . Frequency of Communication with Friends and Family:   . Frequency of Social Gatherings with Friends and Family:   . Attends Religious Services:   . Active Member of Clubs or Organizations:   . Attends BankerClub or Organization Meetings:   Marland Kitchen. Marital Status:   Intimate Partner Violence:   . Fear of Current or Ex-Partner:   . Emotionally Abused:   Marland Kitchen. Physically Abused:   . Sexually Abused:     Family History  Problem Relation Age of Onset  . Hypertension Mother   . Arthritis Mother   . Diabetes Mother   . Hyperlipidemia Mother   . Arthritis Sister   . Rheum arthritis Brother   . Autoimmune disease Brother   . Lung disease Brother   . Arthritis Sister   . Gout Son   . High Cholesterol Daughter      Review of Systems  Constitutional: Negative.  Negative for chills and fever.  HENT: Negative.  Negative for congestion and sore throat.   Respiratory: Negative.  Negative for cough and shortness of breath.   Cardiovascular: Negative.  Negative for chest pain and palpitations.  Gastrointestinal: Positive for abdominal pain and nausea.  Genitourinary: Negative.  Negative for dysuria and hematuria.  Musculoskeletal: Negative.   Skin: Negative.  Negative for rash.  Neurological: Positive for headaches.  All other systems reviewed and are negative.   Vitals:   06/04/20 1440 06/04/20 1444  BP: (!) 141/84 132/80  Pulse: 72   Temp: 98 F (36.7 C)   SpO2: 96%     Physical Exam Vitals reviewed.  Constitutional:      Appearance: She is well-developed.  HENT:     Head: Normocephalic.     Right Ear: Tympanic membrane, ear  canal and external ear normal.     Left Ear: Tympanic membrane, ear canal and external ear normal.     Mouth/Throat:     Mouth: Mucous membranes are moist.     Pharynx: Oropharynx is clear.  Eyes:     Extraocular Movements: Extraocular movements intact.     Conjunctiva/sclera: Conjunctivae normal.     Pupils: Pupils are equal, round, and reactive to light.  Cardiovascular:     Rate and Rhythm: Normal rate and regular rhythm.     Pulses: Normal  pulses.     Heart sounds: Normal heart sounds.  Pulmonary:     Effort: Pulmonary effort is normal.     Breath sounds: Normal breath sounds.  Abdominal:     General: Bowel sounds are normal. There is no distension.     Palpations: Abdomen is soft. There is no mass.     Tenderness: There is no abdominal tenderness. There is no right CVA tenderness, left CVA tenderness, guarding or rebound.  Musculoskeletal:        General: Normal range of motion.     Cervical back: Normal range of motion and neck supple.     Right lower leg: No edema.     Left lower leg: No edema.  Skin:    General: Skin is warm and dry.     Capillary Refill: Capillary refill takes less than 2 seconds.  Neurological:     General: No focal deficit present.     Mental Status: She is alert and oriented to person, place, and time.  Psychiatric:        Mood and Affect: Mood normal.        Behavior: Behavior normal.    Results for orders placed or performed in visit on 06/04/20 (from the past 24 hour(s))  POCT urinalysis dipstick     Status: Abnormal   Collection Time: 06/04/20  3:36 PM  Result Value Ref Range   Color, UA yellow yellow   Clarity, UA clear clear   Glucose, UA negative negative mg/dL   Bilirubin, UA negative negative   Ketones, POC UA negative negative mg/dL   Spec Grav, UA 9.417 4.081 - 1.025   Blood, UA trace-intact (A) negative   pH, UA 6.5 5.0 - 8.0   Protein Ur, POC negative negative mg/dL   Urobilinogen, UA 0.2 0.2 or 1.0 E.U./dL   Nitrite, UA  Negative Negative   Leukocytes, UA Negative Negative   A total of 30 minutes was spent with the patient, greater than 50% of which was in counseling/coordination of care regarding differential diagnosis of abdominal pain, diagnosis of migraine and treatment, review of new migraine medication, review of most recent office visit notes, review of most recent blood work results, diet and nutrition, prognosis, and need for follow-up in 4 weeks.   ASSESSMENT & PLAN: Kendra Rodriguez was seen today for headache and abdominal pain.  Diagnoses and all orders for this visit:  Other migraine without status migrainosus, intractable -     SUMAtriptan (IMITREX) 50 MG tablet; Take 50 mg at the onset of headache; may repeat every 2 hours but no more than 200 mg/24 hours.  Prediabetes -     Comprehensive metabolic panel -     Hemoglobin A1c  Dyslipidemia  Body mass index (BMI) of 35.0-35.9 in adult  Right lower quadrant abdominal pain -     CBC with Differential/Platelet -     Comprehensive metabolic panel -     POCT urinalysis dipstick  General weakness -     TSH -     Vitamin B12    Patient Instructions       If you have lab work done today you will be contacted with your lab results within the next 2 weeks.  If you have not heard from [place] then please contact us. The fastest way to get your results is to register for My Chart.   IF you received an x-ray today, you will receive an invoice from [facility]. Please contact [facility] at  [phone] with questions or concerns regarding your invoice.   IF you received labwork today, you will receive an invoice from [facility]. Please contact [facility] at [phone] with questions or concerns regarding your invoice.   Our billing staff will not be able to assist you with questions regarding bills from these companies.  You will be contacted with the lab results as soon as they are available. The fastest way to get your results is to  activate your My Chart account. Instructions are located on the last page of this paperwork. If you have not heard from [place] regarding the results in 2 weeks, please contact this office.      Cefalea migraosa Migraine Headache Una cefalea migraosa es un dolor muy intenso y punzante en uno o ambos lados de la cabeza. Este tipo de dolor de cabeza tambin puede causar otros sntomas. Puede durar desde 4horas hasta 3das. Hable con su mdico sobre las cosas que pueden causar (desencadenar) esta afeccin. Cules son las causas? Se desconoce la causa exacta de esta afeccin. Esta afeccin puede desencadenarse o ser causada por lo siguiente:  Consumo de alcohol.  Consumo de cigarrillos.  Tomar medicamentos como por ejemplo: ? Medicamentos para Engineer, materials torcico (nitroglicerina). ? Anticonceptivos orales. ? Estrgeno. ? Algunos medicamentos para la presin arterial.  Comer o beber ciertos productos.  Hacer actividad fsica. Otros factores que pueden provocar cefalea migraosa son los siguientes:  Tener el perodo menstrual.  Vanetta Mulders.  Hambre.  Estrs.  No dormir lo suficiente o dormir demasiado.  Cambios climticos.  Cansancio (fatiga). Qu incrementa el riesgo?  Tener entre 25 y 55aos de edad.  Ser mujer.  Tener antecedentes familiares de cefalea migraosa.  Ser de Engineer, manufacturing.  Tener depresin o ansiedad.  Tener mucho sobrepeso. Cules son los signos o los sntomas?  Un dolor punzante. Este dolor puede [employer] siguientes caractersticas: ? Software engineer en cualquier regin de la cabeza, tanto de un lado como de [place]. ? Puede dificultar las actividades cotidianas. ? Puede empeorar con la actividad fsica. ? Puede empeorar con las luces brillantes o los ruidos fuertes.  Otros sntomas pueden incluir: ? Ganas de vomitar (nuseas). ? Vmitos. ? Mareos. ? Sensibilidad a las luces brillantes, los ruidos fuertes o [employer].  Antes de tener  una cefalea migraosa, puede recibir seales de advertencia (aura). Un aura puede incluir: ? Ver luces intermitentes o tener puntos ciegos. ? Ver puntos brillantes, halos o lneas en zigzag. ? Tener una visin en tnel o visin borrosa. ? Sentir entumecimiento u hormigueo. ? Tener dificultad para hablar. ? Tener msculos dbiles.  Algunas personas tienen sntomas despus de una cefalea migraosa (fase posdromal), como los siguientes: ? Cansancio. ? Dificultad para pensar (concentrarse). Cmo se trata?  Tomar medicamentos para: ? Engineer, materials. ? Aliviar la sensacin de Programme researcher, broadcasting/film/video. ? Prevenir las Soil scientist.  El tratamiento tambin puede incluir lo siguiente: ? Tomar sesiones de acupuntura. ? Evitar los alimentos que provocan las cefaleas migraosas. ? Aprender Duaine Dredge de controlar las funciones corporales (biorretroalimentacin). ? Terapia para ayudarlo a Solicitor y lidiar con los pensamientos negativos (terapia cognitivo conductual). Siga estas instrucciones en su casa: Medicamentos  [employer] de venta libre y los recetados solamente como se lo haya indicado el mdico.  Consulte a su mdico si el medicamento que le recetaron: ? Hace que sea necesario que evite conducir o usar maquinaria pesada. ? Puede causarle dificultad para defecar (estreimiento). Es posible que deba tomar estas medidas para  prevenir o tratar los problemas para defecar:  Product manager suficiente lquido para Radio producer pis (la orina) de color amarillo plido.  Tomar medicamentos recetados o de [employer].  Comer alimentos ricos en fibra. Entre ellos, frijoles, cereales integrales y frutas y verduras frescas.  Limitar los alimentos con alto contenido de grasa y International aid/development worker. Estos incluyen alimentos fritos o dulces. Estilo de vida  No beba alcohol.  No consuma ningn producto que contenga nicotina o tabaco, como cigarrillos, cigarrillos electrnicos y tabaco de Theatre manager. Si necesita  ayuda para dejar de fumar, consulte al mdico.  Duerma como mnimo 8horas todas las noches.  Limite el estrs y [place]. Indicaciones generales      Lleve un registro diario para Financial risk analyst lo que Advice worker. Registre, por ejemplo, lo siguiente: ? Lo que usted come y bebe. ? El tiempo que duerme. ? Algn cambio en lo que come o bebe. ? Algn cambio en sus medicamentos.  Si tiene una cefalea migraosa: ? Evite los factores que [employer] sntomas, como las luces brillantes. ? Resulta til acostarse en una habitacin oscura y silenciosa. ? No conduzca vehculos ni opere maquinaria pesada. ? Pregntele al mdico qu actividades son seguras para usted.  Concurra a todas las visitas de [address] se lo haya indicado el mdico. Esto es importante. Comunquese con un mdico si:  Tiene una cefalea migraosa que es diferente o peor que otras que ha tenido.  Tiene ms de [address] de cefalea por mes. Solicite ayuda inmediatamente si:  La cefalea migraosa [employer].  La cefalea migraosa dura ms de 72 horas.  Tiene fiebre.  Presenta rigidez en el cuello.  Tiene dificultad para ver.  Siente debilidad en los msculos o que no puede controlarlos.  Comienza a perder el equilibrio continuamente.  Comienza a tener dificultad para caminar.  Pierde el conocimiento (se desmaya).  Tiene una convulsin. Resumen  [place] cefalea migraosa es un dolor muy intenso y punzante en uno o ambos lados de la cabeza. Estos dolores de [place] tambin pueden causar otros sntomas.  Esta afeccin puede tratarse con medicamentos y cambios en el estilo de vida.  Lleve un registro diario para Financial risk analyst lo que Advice worker.  Comunquese con un mdico si tiene una cefalea migraosa que es diferente o peor que otras que ha tenido.  Comunquese con el mdico si tiene ms de 15 das de cefalea en un mes. Esta informacin no tiene Microbiologist el consejo del mdico. Asegrese de hacerle al mdico cualquier pregunta que tenga. Document Revised: 12/31/2018 Document Reviewed: 12/31/2018 Elsevier Patient Education  2020 Elsevier Inc.      Edwina Barth, MD [facility]

## 2020-06-04 NOTE — Patient Instructions (Addendum)
   If you have lab work done today you will be contacted with your lab results within the next 2 weeks.  If you have not heard from us then please contact us. The fastest way to get your results is to register for My Chart.   IF you received an x-ray today, you will receive an invoice from [facility]. Please contact [facility] at [phone] with questions or concerns regarding your invoice.   IF you received labwork today, you will receive an invoice from [facility]. Please contact [facility] at [phone] with questions or concerns regarding your invoice.   Our billing staff will not be able to assist you with questions regarding bills from these companies.  You will be contacted with the lab results as soon as they are available. The fastest way to get your results is to activate your My Chart account. Instructions are located on the last page of this paperwork. If you have not heard from us regarding the results in 2 weeks, please contact this office.     Cefalea migraosa Migraine Headache Una cefalea migraosa es un dolor muy intenso y punzante en uno o ambos lados de la cabeza. Este tipo de dolor de cabeza tambin puede causar otros sntomas. Puede durar desde 4horas hasta 3das. Hable con su mdico sobre las cosas que pueden causar (desencadenar) esta afeccin. Cules son las causas? Se desconoce la causa exacta de esta afeccin. Esta afeccin puede desencadenarse o ser causada por lo siguiente:  Consumo de alcohol.  Consumo de cigarrillos.  Tomar medicamentos como por ejemplo: ? Medicamentos para aliviar el dolor torcico (nitroglicerina). ? Anticonceptivos orales. ? Estrgeno. ? Algunos medicamentos para la presin arterial.  Comer o beber ciertos productos.  Hacer actividad fsica. Otros factores que pueden provocar cefalea migraosa son los siguientes:  Tener el perodo menstrual.  Embarazo.  Hambre.  Estrs.  No dormir lo suficiente o  dormir demasiado.  Cambios climticos.  Cansancio (fatiga). Qu incrementa el riesgo?  Tener entre 25 y 55aos de edad.  Ser mujer.  Tener antecedentes familiares de cefalea migraosa.  Ser de raza caucsica.  Tener depresin o ansiedad.  Tener mucho sobrepeso. Cules son los signos o los sntomas?  Un dolor punzante. Este dolor puede tener las siguientes caractersticas: ? Puede aparecer en cualquier regin de la cabeza, tanto de un lado como de ambos. ? Puede dificultar las actividades cotidianas. ? Puede empeorar con la actividad fsica. ? Puede empeorar con las luces brillantes o los ruidos fuertes.  Otros sntomas pueden incluir: ? Ganas de vomitar (nuseas). ? Vmitos. ? Mareos. ? Sensibilidad a las luces brillantes, los ruidos fuertes o los olores.  Antes de tener una cefalea migraosa, puede recibir seales de advertencia (aura). Un aura puede incluir: ? Ver luces intermitentes o tener puntos ciegos. ? Ver puntos brillantes, halos o lneas en zigzag. ? Tener una visin en tnel o visin borrosa. ? Sentir entumecimiento u hormigueo. ? Tener dificultad para hablar. ? Tener msculos dbiles.  Algunas personas tienen sntomas despus de una cefalea migraosa (fase posdromal), como los siguientes: ? Cansancio. ? Dificultad para pensar (concentrarse). Cmo se trata?  Tomar medicamentos para: ? Aliviar el dolor. ? Aliviar la sensacin de malestar estomacal. ? Prevenir las cefaleas migraosas.  El tratamiento tambin puede incluir lo siguiente: ? Tomar sesiones de acupuntura. ? Evitar los alimentos que provocan las cefaleas migraosas. ? Aprender maneras de controlar las funciones corporales (biorretroalimentacin). ? Terapia para ayudarlo a conocer y lidiar con los pensamientos   negativos (terapia cognitivo conductual). Siga estas instrucciones en su casa: Medicamentos  Tome los medicamentos de venta libre y los recetados solamente como se lo haya  indicado el mdico.  Consulte a su mdico si el medicamento que le recetaron: ? Hace que sea necesario que evite conducir o usar maquinaria pesada. ? Puede causarle dificultad para defecar (estreimiento). Es posible que deba tomar estas medidas para prevenir o tratar los problemas para defecar:  Beber suficiente lquido para mantener el pis (la orina) de color amarillo plido.  Tomar medicamentos recetados o de venta libre.  Comer alimentos ricos en fibra. Entre ellos, frijoles, cereales integrales y frutas y verduras frescas.  Limitar los alimentos con alto contenido de grasa y azcar. Estos incluyen alimentos fritos o dulces. Estilo de vida  No beba alcohol.  No consuma ningn producto que contenga nicotina o tabaco, como cigarrillos, cigarrillos electrnicos y tabaco de mascar. Si necesita ayuda para dejar de fumar, consulte al mdico.  Duerma como mnimo 8horas todas las noches.  Limite el estrs y manjelo. Indicaciones generales      Lleve un registro diario para averiguar lo que puede provocar las cefaleas migraosas. Registre, por ejemplo, lo siguiente: ? Lo que usted come y bebe. ? El tiempo que duerme. ? Algn cambio en lo que come o bebe. ? Algn cambio en sus medicamentos.  Si tiene una cefalea migraosa: ? Evite los factores que empeoren los sntomas, como las luces brillantes. ? Resulta til acostarse en una habitacin oscura y silenciosa. ? No conduzca vehculos ni opere maquinaria pesada. ? Pregntele al mdico qu actividades son seguras para usted.  Concurra a todas las visitas de seguimiento como se lo haya indicado el mdico. Esto es importante. Comunquese con un mdico si:  Tiene una cefalea migraosa que es diferente o peor que otras que ha tenido.  Tiene ms de 15 das de cefalea por mes. Solicite ayuda inmediatamente si:  La cefalea migraosa empeora mucho.  La cefalea migraosa dura ms de 72 horas.  Tiene fiebre.  Presenta rigidez en  el cuello.  Tiene dificultad para ver.  Siente debilidad en los msculos o que no puede controlarlos.  Comienza a perder el equilibrio continuamente.  Comienza a tener dificultad para caminar.  Pierde el conocimiento (se desmaya).  Tiene una convulsin. Resumen  Una cefalea migraosa es un dolor muy intenso y punzante en uno o ambos lados de la cabeza. Estos dolores de cabeza tambin pueden causar otros sntomas.  Esta afeccin puede tratarse con medicamentos y cambios en el estilo de vida.  Lleve un registro diario para averiguar lo que puede provocar las cefaleas migraosas.  Comunquese con un mdico si tiene una cefalea migraosa que es diferente o peor que otras que ha tenido.  Comunquese con el mdico si tiene ms de 15 das de cefalea en un mes. Esta informacin no tiene como fin reemplazar el consejo del mdico. Asegrese de hacerle al mdico cualquier pregunta que tenga. Document Revised: 12/31/2018 Document Reviewed: 12/31/2018 Elsevier Patient Education  2020 Elsevier Inc.  

## 2020-06-05 LAB — CBC WITH DIFFERENTIAL/PLATELET
Basophils Absolute: 0 10*3/uL (ref 0.0–0.2)
Basos: 1 %
EOS (ABSOLUTE): 0 10*3/uL (ref 0.0–0.4)
Eos: 1 %
Hematocrit: 37.7 % (ref 34.0–46.6)
Hemoglobin: 12.6 g/dL (ref 11.1–15.9)
Immature Grans (Abs): 0 10*3/uL (ref 0.0–0.1)
Immature Granulocytes: 0 %
Lymphocytes Absolute: 2.2 10*3/uL (ref 0.7–3.1)
Lymphs: 36 %
MCH: 27.5 pg (ref 26.6–33.0)
MCHC: 33.4 g/dL (ref 31.5–35.7)
MCV: 82 fL (ref 79–97)
Monocytes Absolute: 0.4 10*3/uL (ref 0.1–0.9)
Monocytes: 7 %
Neutrophils Absolute: 3.3 10*3/uL (ref 1.4–7.0)
Neutrophils: 55 %
Platelets: 315 10*3/uL (ref 150–450)
RBC: 4.59 x10E6/uL (ref 3.77–5.28)
RDW: 12.5 % (ref 11.7–15.4)
WBC: 6 10*3/uL (ref 3.4–10.8)

## 2020-06-05 LAB — COMPREHENSIVE METABOLIC PANEL
ALT: 12 IU/L (ref 0–32)
AST: 14 IU/L (ref 0–40)
Albumin/Globulin Ratio: 1.4 (ref 1.2–2.2)
Albumin: 4.3 g/dL (ref 3.8–4.9)
Alkaline Phosphatase: 134 IU/L — ABNORMAL HIGH (ref 48–121)
BUN/Creatinine Ratio: 37 — ABNORMAL HIGH (ref 9–23)
BUN: 20 mg/dL (ref 6–24)
Bilirubin Total: 0.4 mg/dL (ref 0.0–1.2)
CO2: 23 mmol/L (ref 20–29)
Calcium: 9.6 mg/dL (ref 8.7–10.2)
Chloride: 101 mmol/L (ref 96–106)
Creatinine, Ser: 0.54 mg/dL — ABNORMAL LOW (ref 0.57–1.00)
GFR calc Af Amer: 124 mL/min/{1.73_m2} (ref >59)
GFR calc non Af Amer: 107 mL/min/{1.73_m2} (ref >59)
Globulin, Total: 3 g/dL (ref 1.5–4.5)
Glucose: 97 mg/dL (ref 65–99)
Potassium: 3.9 mmol/L (ref 3.5–5.2)
Sodium: 137 mmol/L (ref 134–144)
Total Protein: 7.3 g/dL (ref 6.0–8.5)

## 2020-06-05 LAB — HEMOGLOBIN A1C
Est. average glucose Bld gHb Est-mCnc: 126 mg/dL
Hgb A1c MFr Bld: 6 % — ABNORMAL HIGH (ref 4.8–5.6)

## 2020-06-05 LAB — VITAMIN B12: Vitamin B-12: 1696 pg/mL — ABNORMAL HIGH (ref 232–1245)

## 2020-06-05 LAB — TSH: TSH: 1.42 u[IU]/mL (ref 0.450–4.500)

## 2020-06-15 ENCOUNTER — Other Ambulatory Visit: Payer: Self-pay | Admitting: Emergency Medicine

## 2020-06-15 DIAGNOSIS — R7303 Prediabetes: Secondary | ICD-10-CM

## 2020-06-22 ENCOUNTER — Other Ambulatory Visit: Payer: Self-pay | Admitting: Emergency Medicine

## 2020-06-22 NOTE — Telephone Encounter (Signed)
Requested medication (s) are due for refill today:  Yes  Requested medication (s) are on the active medication list:  Yes  Future visit scheduled:  Yes  Last Refill: Historical Provider.  Notes to clinic: pt. reported not taking this medication on 06/04/20  Requested Prescriptions  Pending Prescriptions Disp Refills   atorvastatin (LIPITOR) 40 MG tablet [Pharmacy Med Name: ATORVASTATIN 40MG  TABLETS] 90 tablet     Sig: TAKE 1 TABLET(40 MG) BY MOUTH DAILY      Cardiovascular:  Antilipid - Statins Passed - 06/22/2020  5:56 PM      Passed - Total Cholesterol in normal range and within 360 days    Cholesterol, Total  Date Value Ref Range Status  06/30/2019 129 100 - 199 mg/dL Final          Passed - LDL in normal range and within 360 days    LDL Calculated  Date Value Ref Range Status  06/30/2019 50 0 - 99 mg/dL Final    Comment:    **Effective July 04, 2019, [facility] is implementing an improved** equation to calculate Low Density Lipoprotein Cholesterol (LDL-C) concentrations, to be used in all lipid panels that report calculated LDL-C. This equation was developed through a collaboration with the July 06, 2019, Lung and Blood Institutes of Health (NIH).[1] The NIH calculation overcomes the limitations of the existing Friedewald LDL-C equation and performs equally well in both fasting and non-fasting individuals. 1. [employer] Q, et al. A new equation for calculation of low-density lipoprotein cholesterol in patients with normolipidemia and/or hypertriglyceridemia. JAMA Cardiol. 2020 Feb 26. doi:10.1001/jamacardio.2020.0013           Passed - HDL in normal range and within 360 days    HDL  Date Value Ref Range Status  06/30/2019 51 >39 mg/dL Final          Passed - Triglycerides in normal range and within 360 days    Triglycerides  Date Value Ref Range Status  06/30/2019 138 0 - 149 mg/dL Final          Passed - Patient is not pregnant      Passed -  Valid encounter within last 12 months    Recent Outpatient Visits           2 weeks ago Other migraine without status migrainosus, intractable   Primary Care at [facility], [place], [place]   3 months ago Acute non-recurrent pansinusitis   Primary Care at [place], Oneita Jolly, [place]   6 months ago Blurred vision, left eye   Primary Care at [facility], [place], [place]   11 months ago Nonspecific chest pain   Primary Care at [facility], [facility], [place]   1 year ago Acute non-recurrent frontal sinusitis   Primary Care at [facility], [facility], [place]       Future Appointments             In 1 week Sagardia, Eilleen Kempf, MD Primary Care at [place], [facility]

## 2020-06-26 ENCOUNTER — Encounter: Payer: Self-pay | Admitting: Emergency Medicine

## 2020-06-26 ENCOUNTER — Telehealth (INDEPENDENT_AMBULATORY_CARE_PROVIDER_SITE_OTHER): Payer: BLUE CROSS/BLUE SHIELD | Admitting: Emergency Medicine

## 2020-06-26 ENCOUNTER — Other Ambulatory Visit: Payer: Self-pay

## 2020-06-26 VITALS — Ht 63.0 in | Wt 192.0 lb

## 2020-06-26 DIAGNOSIS — J029 Acute pharyngitis, unspecified: Secondary | ICD-10-CM

## 2020-06-26 DIAGNOSIS — Z20818 Contact with and (suspected) exposure to other bacterial communicable diseases: Secondary | ICD-10-CM

## 2020-06-26 MED ORDER — AZITHROMYCIN 250 MG PO TABS: ORAL_TABLET | ORAL | 0 refills | Status: DC

## 2020-06-26 NOTE — Progress Notes (Signed)
Telemedicine Encounter- SOAP NOTE Established Patient  This telephone encounter was conducted with the patient's (or proxy's) verbal consent via audio telecommunications: yes/no: Yes Patient was instructed to have this encounter in a suitably private space; and to only have persons present to whom they give permission to participate. In addition, patient identity was confirmed by use of name plus two identifiers (DOB and address).  I discussed the limitations, risks, security and privacy concerns of performing an evaluation and management service by telephone and the availability of in person appointments. I also discussed with the patient that there may be a patient responsible charge related to this service. The patient expressed understanding and agreed to proceed.  I spent a total of TIME; 0 MIN TO 60 MIN: 20 minutes talking with the patient or their proxy.  Chief Complaint  Patient presents with  . Sore Throat    pain with swollowing and pt woke up with sx. Grandchildren has strep throat and they just visited pt.   . burning in eyes    Subjective   Kendra Rodriguez is a 54 y.o. female established patient. Telephone visit today complaining of sore throat with painful difficult swallowing that started this morning.  Daughter and grandchildren diagnosed with strep throats.  No other significant symptoms.  HPI   Patient Active Problem List   Diagnosis Date Noted  . Body mass index (BMI) of 35.0-35.9 in adult 06/04/2020  . History of migraine headaches 12/06/2019  . Mixed hyperlipidemia 09/04/2019  . Dyslipidemia 06/30/2019  . GERD with esophagitis 06/30/2019  . Prediabetes 10/17/2015  . Hypertriglyceridemia 10/17/2015  . Spinal stenosis, lumbar region, with neurogenic claudication 10/24/2014    Past Medical History:  Diagnosis Date  . Acute sinusitis   . Allergy   . Anxiety   . Depression    NO MEDICATIONS - DOING OK NOW  . Dizziness   . Ear ache    RIGHT --HX OF  MULTIPLE EAR INFECTIONS AS A CHILD   . GERD (gastroesophageal reflux disease)   . Headache(784.0)    MIGRAINES  . Leg pain, left    with swelling  . Lumbar stenosis    PAIN BACK AND LEFT LEG AND NUMBNESS LEG AND FOOT  . Peripheral vascular disease (HCC)    HX OF TREATMENT FOR VARICOSE VEINS RIGHT LEG  . Thyroid disease    TOLD BLOOD LEVELS SLIGHTLY ABNORMAL - BUT NOT REQUIRED MEDICATION    Current Outpatient Medications  Medication Sig Dispense Refill  . atorvastatin (LIPITOR) 40 MG tablet TAKE 1 TABLET(40 MG) BY MOUTH DAILY 90 tablet 0  . cholecalciferol (VITAMIN D3) 25 MCG (1000 UNIT) tablet Take 1,000 Units by mouth daily.    . metFORMIN (GLUCOPHAGE) 850 MG tablet TAKE 1 TABLET(850 MG) BY MOUTH TWICE DAILY WITH A MEAL 180 tablet 1  . sucralfate (CARAFATE) 1 GM/10ML suspension     . SUMAtriptan (IMITREX) 50 MG tablet Take 50 mg at the onset of headache; may repeat every 2 hours but no more than 200 mg/24 hours. 10 tablet 1  . XIIDRA 5 % SOLN Place 1 drop into both eyes 2 (two) times daily.    Marland Kitchen DEXILANT 60 MG capsule TAKE 1 CAPSULE(60 MG) BY MOUTH EVERY MORNING (Patient not taking: Reported on 06/04/2020) 30 capsule 3  . triamcinolone (NASACORT) 55 MCG/ACT AERO nasal inhaler Place 2 sprays into the nose daily. (Patient not taking: Reported on 06/26/2020) 1 Inhaler 12   No current facility-administered medications for this visit.  No Known Allergies  Social History   Socioeconomic History  . Marital status: Married    Spouse name: Not on file  . Number of children: Not on file  . Years of education: Not on file  . Highest education level: Not on file  Occupational History  . Not on file  Tobacco Use  . Smoking status: Never Smoker  . Smokeless tobacco: Never Used  Vaping Use  . Vaping Use: Never used  Substance and Sexual Activity  . Alcohol use: No  . Drug use: No  . Sexual activity: Yes  Other Topics Concern  . Not on file  Social History Narrative  . Not on file    Social Determinants of Health   Financial Resource Strain:   . Difficulty of Paying Living Expenses: Not on file  Food Insecurity:   . Worried About Programme researcher, broadcasting/film/video in the Last Year: Not on file  . Ran Out of Food in the Last Year: Not on file  Transportation Needs:   . Lack of Transportation (Medical): Not on file  . Lack of Transportation (Non-Medical): Not on file  Physical Activity:   . Days of Exercise per Week: Not on file  . Minutes of Exercise per Session: Not on file  Stress:   . Feeling of Stress : Not on file  Social Connections:   . Frequency of Communication with Friends and Family: Not on file  . Frequency of Social Gatherings with Friends and Family: Not on file  . Attends Religious Services: Not on file  . Active Member of Clubs or Organizations: Not on file  . Attends Banker Meetings: Not on file  . Marital Status: Not on file  Intimate Partner Violence:   . Fear of Current or Ex-Partner: Not on file  . Emotionally Abused: Not on file  . Physically Abused: Not on file  . Sexually Abused: Not on file    Review of Systems  Constitutional: Negative.  Negative for chills and fever.  HENT: Positive for congestion and sore throat.   Respiratory: Negative for cough and shortness of breath.   Cardiovascular: Negative for chest pain and palpitations.  Gastrointestinal: Negative for abdominal pain, diarrhea, nausea and vomiting.  Genitourinary: Negative.  Negative for dysuria and hematuria.  Skin: Negative.  Negative for rash.  Neurological: Negative.  Negative for dizziness and headaches.  All other systems reviewed and are negative.   Objective  Alert and oriented x3 in no apparent respiratory distress.   Vitals as reported by the patient: Today's Vitals   06/26/20 1550  Weight: 192 lb (87.1 kg)  Height: 5\' 3"  (1.6 m)    There are no diagnoses linked to this encounter. [name] was seen today for sore throat and burning in  eyes.  Diagnoses and all orders for this visit:  Sore throat -     azithromycin (ZITHROMAX) 250 MG tablet; Sig as indicated  Exposure to Streptococcus infection -     azithromycin (ZITHROMAX) 250 MG tablet; Sig as indicated     I discussed the assessment and treatment plan with the patient. The patient was provided an opportunity to ask questions and all were answered. The patient agreed with the plan and demonstrated an understanding of the instructions.   The patient was advised to call back or seek an in-person evaluation if the symptoms worsen or if the condition fails to improve as anticipated.  I provided 20 minutes of non-face-to-face time during this encounter.  Mena  Victorino December, MD  Primary Care at [facility]

## 2020-06-27 ENCOUNTER — Ambulatory Visit: Payer: BLUE CROSS/BLUE SHIELD | Admitting: Emergency Medicine

## 2020-06-29 ENCOUNTER — Encounter: Payer: Self-pay | Admitting: Emergency Medicine

## 2020-06-29 NOTE — Telephone Encounter (Signed)
Pt is going to finish ABX soon but is still complaining of congestion is there something else she should do to help relieve this?

## 2020-06-30 ENCOUNTER — Ambulatory Visit (HOSPITAL_COMMUNITY)
Admission: EM | Admit: 2020-06-30 | Discharge: 2020-06-30 | Disposition: A | Discharge status: Home / Self Care | Payer: BLUE CROSS/BLUE SHIELD | Attending: Urgent Care | Admitting: Urgent Care

## 2020-06-30 ENCOUNTER — Encounter (HOSPITAL_COMMUNITY): Payer: Self-pay | Admitting: *Deleted

## 2020-06-30 ENCOUNTER — Other Ambulatory Visit: Payer: Self-pay

## 2020-06-30 DIAGNOSIS — K219 Gastro-esophageal reflux disease without esophagitis: Secondary | ICD-10-CM | POA: Insufficient documentation

## 2020-06-30 DIAGNOSIS — G43909 Migraine, unspecified, not intractable, without status migrainosus: Secondary | ICD-10-CM | POA: Diagnosis not present

## 2020-06-30 DIAGNOSIS — H9203 Otalgia, bilateral: Secondary | ICD-10-CM | POA: Diagnosis not present

## 2020-06-30 DIAGNOSIS — R0981 Nasal congestion: Secondary | ICD-10-CM | POA: Diagnosis not present

## 2020-06-30 DIAGNOSIS — Z20822 Contact with and (suspected) exposure to covid-19: Secondary | ICD-10-CM | POA: Diagnosis not present

## 2020-06-30 DIAGNOSIS — Z8249 Family history of ischemic heart disease and other diseases of the circulatory system: Secondary | ICD-10-CM | POA: Insufficient documentation

## 2020-06-30 DIAGNOSIS — F329 Major depressive disorder, single episode, unspecified: Secondary | ICD-10-CM | POA: Insufficient documentation

## 2020-06-30 DIAGNOSIS — J069 Acute upper respiratory infection, unspecified: Secondary | ICD-10-CM | POA: Diagnosis not present

## 2020-06-30 DIAGNOSIS — Z79899 Other long term (current) drug therapy: Secondary | ICD-10-CM | POA: Insufficient documentation

## 2020-06-30 DIAGNOSIS — F419 Anxiety disorder, unspecified: Secondary | ICD-10-CM | POA: Insufficient documentation

## 2020-06-30 DIAGNOSIS — Z7984 Long term (current) use of oral hypoglycemic drugs: Secondary | ICD-10-CM | POA: Insufficient documentation

## 2020-06-30 MED ORDER — PROMETHAZINE-DM 6.25-15 MG/5ML PO SYRP: 5.0000 mL | ORAL_SOLUTION | Freq: Every evening | ORAL | 0 refills | Status: DC | PRN

## 2020-06-30 MED ORDER — BENZONATATE 100 MG PO CAPS: 100.0000 mg | ORAL_CAPSULE | Freq: Three times a day (TID) | ORAL | 0 refills | Status: DC | PRN

## 2020-06-30 MED ORDER — PSEUDOEPHEDRINE HCL 60 MG PO TABS
60.0000 mg | ORAL_TABLET | Freq: Four times a day (QID) | ORAL | 0 refills | Status: DC | PRN
Start: 2020-06-30 — End: 2021-05-28

## 2020-06-30 NOTE — ED Triage Notes (Signed)
C/O cough, frontal HA, right ear pain, body aches, congestion x 4 days.  Received Rx for azithromycin from PCP 3 days ago.  Denies any known fevers.

## 2020-06-30 NOTE — ED Provider Notes (Signed)
[facility]   MRN: [id number] DOB: [birth year]  Subjective:   Kendra Rodriguez is a 54 y.o. female presenting for 4-day history of nasal congestion, worsening sinus pressure and bilateral ear fullness/pain, frontal headache.  She is also had a persistent dry cough.  States that she had multiple sick contacts and this past week, they all tested negative for COVID-19.  She did have a grandchild that tested positive for strep.  She does not endorse any sore throat.  She contacted Dr. Alvy Bimler at [facility] and was prescribed azithromycin, she has 1 day of medication left.  She did get her COVID vaccine, got the [employer] vaccine.  No current facility-administered medications for this encounter.  Current Outpatient Medications:    atorvastatin (LIPITOR) 40 MG tablet, TAKE 1 TABLET(40 MG) BY MOUTH DAILY, Disp: 90 tablet, Rfl: 0   azithromycin (ZITHROMAX) 250 MG tablet, Sig as indicated, Disp: 6 tablet, Rfl: 0   cholecalciferol (VITAMIN D3) 25 MCG (1000 UNIT) tablet, Take 1,000 Units by mouth daily., Disp: , Rfl:    DEXILANT 60 MG capsule, TAKE 1 CAPSULE(60 MG) BY MOUTH EVERY MORNING, Disp: 30 capsule, Rfl: 3   metFORMIN (GLUCOPHAGE) 850 MG tablet, TAKE 1 TABLET(850 MG) BY MOUTH TWICE DAILY WITH A MEAL, Disp: 180 tablet, Rfl: 1   XIIDRA 5 % SOLN, Place 1 drop into both eyes 2 (two) times daily., Disp: , Rfl:    sucralfate (CARAFATE) 1 GM/10ML suspension, , Disp: , Rfl:    SUMAtriptan (IMITREX) 50 MG tablet, Take 50 mg at the onset of headache; may repeat every 2 hours but no more than 200 mg/24 hours., Disp: 10 tablet, Rfl: 1   triamcinolone (NASACORT) 55 MCG/ACT AERO nasal inhaler, Place 2 sprays into the nose daily. (Patient not taking: Reported on 06/26/2020), Disp: 1 Inhaler, Rfl: 12   No Known Allergies  Past Medical History:  Diagnosis Date   Acute sinusitis    Allergy    Anxiety    Depression    NO MEDICATIONS - DOING OK NOW   Dizziness    Ear ache    RIGHT --HX OF  MULTIPLE EAR INFECTIONS AS A CHILD    GERD (gastroesophageal reflux disease)    Headache(784.0)    MIGRAINES   Leg pain, left    with swelling   Lumbar stenosis    PAIN BACK AND LEFT LEG AND NUMBNESS LEG AND FOOT   Peripheral vascular disease (HCC)    HX OF TREATMENT FOR VARICOSE VEINS RIGHT LEG   Thyroid disease    TOLD BLOOD LEVELS SLIGHTLY ABNORMAL - BUT NOT REQUIRED MEDICATION     Past Surgical History:  Procedure Laterality Date   ABDOMINAL HYSTERECTOMY     BACK SURGERY     bladder lift     AT SAME TIME OF HYSTERECTOMY   CHOLECYSTECTOMY     LUMBAR LAMINECTOMY/DECOMPRESSION MICRODISCECTOMY Left 10/24/2014   Procedure: COMPLETE LUMBAR DECOMPRESSION L4-L5 CENTRAL AND MICRODISCECTOMY L4-L5 LEFT;  Surgeon: Jacki Cones, MD;  Location: [facility];  Service: Orthopedics;  Laterality: Left;    Family History  Problem Relation Age of Onset   Hypertension Mother    Arthritis Mother    Diabetes Mother    Hyperlipidemia Mother    Arthritis Sister    Rheum arthritis Brother    Autoimmune disease Brother    Lung disease Brother    Arthritis Sister    Gout Son    High Cholesterol Daughter     Social History   Tobacco  Use   Smoking status: Never Smoker   Smokeless tobacco: Never Used  Vaping Use   Vaping Use: Never used  Substance Use Topics   Alcohol use: No   Drug use: No    ROS   Objective:   Vitals: BP (!) 129/91    Pulse 92    Temp 99.1 F (37.3 C) (Oral)    Resp 16    SpO2 100%   Physical Exam Constitutional:      General: She is not in acute distress.    Appearance: Normal appearance. She is well-developed. She is not ill-appearing, toxic-appearing or diaphoretic.  HENT:     Head: Normocephalic and atraumatic.     Right Ear: Tympanic membrane and ear canal normal. No drainage or tenderness. No middle ear effusion. Tympanic membrane is not erythematous.     Left Ear: Tympanic membrane and ear canal normal. No drainage or  tenderness.  No middle ear effusion. Tympanic membrane is not erythematous.     Nose: Congestion and rhinorrhea present.     Mouth/Throat:     Mouth: Mucous membranes are moist. No oral lesions.     Pharynx: No pharyngeal swelling, oropharyngeal exudate, posterior oropharyngeal erythema or uvula swelling.     Tonsils: No tonsillar exudate or tonsillar abscesses.  Eyes:     General: No scleral icterus.       Right eye: No discharge.        Left eye: No discharge.     Extraocular Movements: Extraocular movements intact.     Right eye: Normal extraocular motion.     Left eye: Normal extraocular motion.     Conjunctiva/sclera: Conjunctivae normal.     Pupils: Pupils are equal, round, and reactive to light.  Cardiovascular:     Rate and Rhythm: Normal rate and regular rhythm.     Pulses: Normal pulses.     Heart sounds: Normal heart sounds. No murmur heard.  No friction rub. No gallop.   Pulmonary:     Effort: Pulmonary effort is normal. No respiratory distress.     Breath sounds: Normal breath sounds. No stridor. No wheezing, rhonchi or rales.  Musculoskeletal:     Cervical back: Normal range of motion and neck supple.  Lymphadenopathy:     Cervical: No cervical adenopathy.  Skin:    General: Skin is warm and dry.     Findings: No rash.  Neurological:     General: No focal deficit present.     Mental Status: She is alert and oriented to person, place, and time.  Psychiatric:        Mood and Affect: Mood normal.        Behavior: Behavior normal.        Thought Content: Thought content normal.        Judgment: Judgment normal.      Assessment and Plan :   PDMP not reviewed this encounter.  1. Viral URI with cough   2. Sinus congestion   3. Acute ear pain, bilateral     Will manage for viral illness such as viral URI, viral syndrome, viral rhinitis, COVID-19. Counseled patient on nature of COVID-19 including modes of transmission, diagnostic testing, management and  supportive care.  Offered scripts for symptomatic relief. COVID 19 testing is pending. Counseled patient on potential for adverse effects with medications prescribed/recommended today, ER and return-to-clinic precautions discussed, patient verbalized understanding.     [name], [place] 06/30/20 1115

## 2020-06-30 NOTE — Discharge Instructions (Addendum)
Para el dolor de garganta o tos puede usar un t de miel. Use 3 cucharaditas de miel con jugo exprimido de [employer]. Coloque trozos de Microbiologist en 1/2-1 taza de agua y caliente sobre la estufa. Luego mezcle los ingredientes y repita cada 4 horas. Para fiebre, dolores de cuerpo tome ibuprofeno 400mg -600mg  con comida cada 6 horas alternando con o junto con Tylenol 500mg -650mg  cada 6 horas. Hidrata muy bien con al menos 2 litros (64 onzas) de agua al dia. Coma comidas ligeras como sopas para y nutricion. Tambien puede tomar suero. Comience un antihistamnico como Zyrtec (cetirizina) 10mg  al dia. Puede usar pseudoefedrina (Sudafed) de venta libre para el goteo posnasal, congestin a una dosis de 60 mg cada 8 horas cada 12 horas. Use el jarabe por la noche para su tos y las capsulas durante el dia.

## 2020-07-01 LAB — SARS CORONAVIRUS 2 (TAT 6-24 HRS): SARS Coronavirus 2: NEGATIVE

## 2020-07-02 ENCOUNTER — Other Ambulatory Visit: Payer: Self-pay

## 2020-07-02 ENCOUNTER — Ambulatory Visit: Payer: BLUE CROSS/BLUE SHIELD | Admitting: Emergency Medicine

## 2020-07-02 ENCOUNTER — Encounter: Payer: Self-pay | Admitting: Emergency Medicine

## 2020-07-02 VITALS — BP 157/82 | HR 75 | Temp 98.0°F | Resp 16 | Ht 63.0 in | Wt 189.0 lb

## 2020-07-02 DIAGNOSIS — J0100 Acute maxillary sinusitis, unspecified: Secondary | ICD-10-CM | POA: Diagnosis not present

## 2020-07-02 DIAGNOSIS — R0981 Nasal congestion: Secondary | ICD-10-CM | POA: Diagnosis not present

## 2020-07-02 MED ORDER — PREDNISONE 20 MG PO TABS
20.0000 mg | ORAL_TABLET | Freq: Every day | ORAL | 0 refills | Status: AC
Start: 2020-07-02 — End: 2020-07-07

## 2020-07-02 MED ORDER — AMOXICILLIN-POT CLAVULANATE 875-125 MG PO TABS: 1.0000 | ORAL_TABLET | Freq: Two times a day (BID) | ORAL | 0 refills | Status: AC

## 2020-07-02 NOTE — Progress Notes (Signed)
Kendra Rodriguez 54 y.o.   Chief Complaint  Patient presents with  . Sinus Problem    follow up and nasal congestion at [facility] 06/30/2020    HISTORY OF PRESENT ILLNESS: This is a 54 y.o. female complaining of 1 week history of flulike symptoms.  Was exposed to children with Streptococcus. Seen by me last week.  Finished course of azithromycin.  Seen at urgent care center 2 days ago and diagnosed with viral illness.  Fully vaccinated against Covid.  Tested negative for Covid infection.  Still complaining of sinus congestion, pressure, pain in her face along with nasal purulent drainage and general weakness and malaise.  Denies fever or chills.  Able to eat and drink.  Denies nausea or vomiting.  Denies diarrhea.  Denies chest pain or difficulty breathing.  No other significant associated symptoms.  HPI   Prior to Admission medications   Medication Sig Start Date End Date Taking? Authorizing Provider  atorvastatin (LIPITOR) 40 MG tablet TAKE 1 TABLET(40 MG) BY MOUTH DAILY 06/25/20  Yes Myles Lipps, MD  benzonatate (TESSALON) 100 MG capsule Take 1-2 capsules (100-200 mg total) by mouth 3 (three) times daily as needed. 06/30/20  Yes [name], PA-C  cholecalciferol (VITAMIN D3) 25 MCG (1000 UNIT) tablet Take 1,000 Units by mouth daily.   Yes [provider]  DEXILANT 60 MG capsule TAKE 1 CAPSULE(60 MG) BY MOUTH EVERY MORNING 10/05/19  Yes Chevon Fomby, Eilleen Kempf, MD  metFORMIN (GLUCOPHAGE) 850 MG tablet TAKE 1 TABLET(850 MG) BY MOUTH TWICE DAILY WITH A MEAL 06/15/20  Yes Delno Blaisdell, Eilleen Kempf, MD  promethazine-dextromethorphan (PROMETHAZINE-DM) 6.25-15 MG/5ML syrup Take 5 mLs by mouth at bedtime as needed for cough. 06/30/20  Yes [name], PA-C  pseudoephedrine (SUDAFED) 60 MG tablet Take 1 tablet (60 mg total) by mouth every 6 (six) hours as needed for congestion. 06/30/20  Yes [name], PA-C  SUMAtriptan (IMITREX) 50 MG tablet Take 50 mg at the onset of headache; may repeat every 2  hours but no more than 200 mg/24 hours. 06/04/20  Yes Lineth Thielke, Eilleen Kempf, MD  XIIDRA 5 % SOLN Place 1 drop into both eyes 2 (two) times daily. 03/23/20  Yes [provider]  azithromycin (ZITHROMAX) 250 MG tablet Sig as indicated Patient not taking: Reported on 07/02/2020 06/26/20   [name], MD  sucralfate (CARAFATE) 1 GM/10ML suspension  01/06/20   [provider]  triamcinolone (NASACORT) 55 MCG/ACT AERO nasal inhaler Place 2 sprays into the nose daily. Patient not taking: Reported on 07/02/2020 03/02/20   Myles Lipps, MD    No Known Allergies  Patient Active Problem List   Diagnosis Date Noted  . Body mass index (BMI) of 35.0-35.9 in adult 06/04/2020  . History of migraine headaches 12/06/2019  . Mixed hyperlipidemia 09/04/2019  . Dyslipidemia 06/30/2019  . GERD with esophagitis 06/30/2019  . Prediabetes 10/17/2015  . Hypertriglyceridemia 10/17/2015  . Spinal stenosis, lumbar region, with neurogenic claudication 10/24/2014    Past Medical History:  Diagnosis Date  . Acute sinusitis   . Allergy   . Anxiety   . Depression    NO MEDICATIONS - DOING OK NOW  . Dizziness   . Ear ache    RIGHT --HX OF MULTIPLE EAR INFECTIONS AS A CHILD   . GERD (gastroesophageal reflux disease)   . Headache(784.0)    MIGRAINES  . Leg pain, left    with swelling  . Lumbar stenosis    PAIN BACK AND LEFT  LEG AND NUMBNESS LEG AND FOOT  . Peripheral vascular disease (HCC)    HX OF TREATMENT FOR VARICOSE VEINS RIGHT LEG  . Thyroid disease    TOLD BLOOD LEVELS SLIGHTLY ABNORMAL - BUT NOT REQUIRED MEDICATION    Past Surgical History:  Procedure Laterality Date  . ABDOMINAL HYSTERECTOMY    . BACK SURGERY    . bladder lift     AT SAME TIME OF HYSTERECTOMY  . CHOLECYSTECTOMY    . LUMBAR LAMINECTOMY/DECOMPRESSION MICRODISCECTOMY Left 10/24/2014   Procedure: COMPLETE LUMBAR DECOMPRESSION L4-L5 CENTRAL AND MICRODISCECTOMY L4-L5 LEFT;  Surgeon: Jacki Conesonald A Gioffre, MD;   Location: [facility];  Service: Orthopedics;  Laterality: Left;    Social History   Socioeconomic History  . Marital status: Married    Spouse name: Not on file  . Number of children: Not on file  . Years of education: Not on file  . Highest education level: Not on file  Occupational History  . Not on file  Tobacco Use  . Smoking status: Never Smoker  . Smokeless tobacco: Never Used  Vaping Use  . Vaping Use: Never used  Substance and Sexual Activity  . Alcohol use: No  . Drug use: No  . Sexual activity: Not on file  Other Topics Concern  . Not on file  Social History Narrative  . Not on file   Social Determinants of Health   Financial Resource Strain:   . Difficulty of Paying Living Expenses: Not on file  Food Insecurity:   . Worried About Programme researcher, broadcasting/film/videounning Out of Food in the Last Year: Not on file  . Ran Out of Food in the Last Year: Not on file  Transportation Needs:   . Lack of Transportation (Medical): Not on file  . Lack of Transportation (Non-Medical): Not on file  Physical Activity:   . Days of Exercise per Week: Not on file  . Minutes of Exercise per Session: Not on file  Stress:   . Feeling of Stress : Not on file  Social Connections:   . Frequency of Communication with Friends and Family: Not on file  . Frequency of Social Gatherings with Friends and Family: Not on file  . Attends Religious Services: Not on file  . Active Member of Clubs or Organizations: Not on file  . Attends BankerClub or Organization Meetings: Not on file  . Marital Status: Not on file  Intimate Partner Violence:   . Fear of Current or Ex-Partner: Not on file  . Emotionally Abused: Not on file  . Physically Abused: Not on file  . Sexually Abused: Not on file    Family History  Problem Relation Age of Onset  . Hypertension Mother   . Arthritis Mother   . Diabetes Mother   . Hyperlipidemia Mother   . Arthritis Sister   . Rheum arthritis Brother   . Autoimmune disease Brother   . Lung disease  Brother   . Arthritis Sister   . Gout Son   . High Cholesterol Daughter      Review of Systems  Constitutional: Positive for malaise/fatigue. Negative for chills and fever.  HENT: Positive for congestion and sinus pain. Negative for sore throat.   Eyes: Negative.   Respiratory: Positive for cough. Negative for shortness of breath and wheezing.   Cardiovascular: Negative.  Negative for chest pain and palpitations.  Gastrointestinal: Negative.  Negative for abdominal pain, diarrhea, nausea and vomiting.  Genitourinary: Negative.  Negative for dysuria and hematuria.  Musculoskeletal: Negative.  Skin: Negative.  Negative for rash.  Neurological: Positive for headaches. Negative for dizziness.  All other systems reviewed and are negative.  Today's Vitals   07/02/20 0902  BP: (!) 157/82  Pulse: 75  Resp: 16  Temp: 98 F (36.7 C)  TempSrc: Temporal  SpO2: 97%  Weight: 189 lb (85.7 kg)  Height: 5\' 3"  (1.6 m)   Body mass index is 33.48 kg/m.   Physical Exam Vitals reviewed.  Constitutional:      Appearance: Normal appearance.  HENT:     Head: Normocephalic.     Nose: Congestion present.     Right Sinus: Maxillary sinus tenderness present.     Left Sinus: Maxillary sinus tenderness present.     Mouth/Throat:     Mouth: Mucous membranes are moist.     Pharynx: Oropharynx is clear.  Eyes:     Extraocular Movements: Extraocular movements intact.     Conjunctiva/sclera: Conjunctivae normal.     Pupils: Pupils are equal, round, and reactive to light.  Neck:     Vascular: No carotid bruit.  Cardiovascular:     Rate and Rhythm: Normal rate and regular rhythm.     Pulses: Normal pulses.     Heart sounds: Normal heart sounds.  Pulmonary:     Effort: Pulmonary effort is normal.     Breath sounds: Normal breath sounds.  Musculoskeletal:        General: Normal range of motion.     Cervical back: Normal range of motion and neck supple. No tenderness.  Lymphadenopathy:      Cervical: No cervical adenopathy.  Skin:    General: Skin is warm and dry.     Capillary Refill: Capillary refill takes less than 2 seconds.  Neurological:     General: No focal deficit present.     Mental Status: She is alert and oriented to person, place, and time.  Psychiatric:        Mood and Affect: Mood normal.        Behavior: Behavior normal.      ASSESSMENT & PLAN: Lucyann was seen today for sinus problem.  Diagnoses and all orders for this visit:  Acute non-recurrent maxillary sinusitis -     amoxicillin-clavulanate (AUGMENTIN) 875-125 MG tablet; Take 1 tablet by mouth 2 (two) times daily for 7 days.  Sinus congestion -     predniSONE (DELTASONE) 20 MG tablet; Take 1 tablet (20 mg total) by mouth daily with breakfast for 5 days.    Patient Instructions       If you have lab work done today you will be contacted with your lab results within the next 2 weeks.  If you have not heard from Carley Hammed then please contact us. The fastest way to get your results is to register for My Chart.   IF you received an x-ray today, you will receive an invoice from [facility]. Please contact [facility] at [phone] with questions or concerns regarding your invoice.   IF you received labwork today, you will receive an invoice from [place]. Please contact [facility] at [phone] with questions or concerns regarding your invoice.   Our billing staff will not be able to assist you with questions regarding bills from these companies.  You will be contacted with the lab results as soon as they are available. The fastest way to get your results is to activate your My Chart account. Instructions are located on the last page of this paperwork. If you have not  heard from [place] regarding the results in 2 weeks, please contact this office.     Sinusitis, en adultos Sinusitis, Adult La sinusitis es el dolor y la hinchazn (inflamacin) de los senos paranasales. Los senos  paranasales son espacios vacos en los huesos alrededor del rostro. Estos se encuentran en los siguientes lugares:  Alrededor de los ojos.  En la mitad de la frente.  Detrs de Architectural technologist.  En los pmulos. Los senos paranasales y las fosas nasales estn cubiertos de un lquido llamado mucosidad. La mucosidad drena a travs de los senos paranasales. La hinchazn puede atrapar mucosidad en los senos paranasales. Esto permite que se desarrollen grmenes (bacterias, virus u hongos), lo que produce infecciones. La [employer], la causa de esta afeccin es un virus. Cules son las causas? Las causas de esta afeccin son:  [place].  Asma.  Grmenes.  Objetos que obstruyen la nariz o los senos paranasales.  Crecimientos en el interior de la nariz (plipos nasales).  Sustancias qumicas o irritantes que estn presentes en el aire.  Hongos (poco frecuente). Qu incrementa el riesgo? Es ms probable que contraiga esta afeccin si:  Tiene debilitado el sistema de defensa del organismo (sistema inmunitario).  Nada o bucea mucho.  [place] aerosoles nasales en exceso.  Fuma. Cules son los signos o los sntomas? Los principales sntomas de esta afeccin son dolor y sensacin de presin alrededor de los senos paranasales. Otros sntomas pueden incluir los siguientes:  Nariz tapada (congestin nasal).  Goteo nasal (drenaje).  Hinchazn y calor en los senos paranasales.  Dolor de [place].  Dolor dental.  Tos que puede empeorar por la noche.  Mucosidad que se acumula en la garganta o la parte posterior de la nariz (goteo posnasal).  Incapacidad de sentir olores y sabores.  Estar muy cansado (fatiga).  Grant Ruts.  Dolor de Advertising copywriter.  Mal aliento. Cmo se diagnostica? Esta afeccin se diagnostica en funcin de lo siguiente:  Sus sntomas.  Sus antecedentes mdicos.  Un examen fsico.  Pruebas para averiguar si la afeccin es de corta duracin [place]) o de larga  duracin (crnica). El mdico puede: ? Revisarle la nariz para detectar crecimientos (plipos). ? Revisarle los senos paranasales con una herramienta que tiene una luz (endoscopio). ? Revisar si tiene alergias o grmenes. ? Hacerle pruebas de diagnstico por imgenes, como una resonancia magntica (RM) o exploracin por tomografa computarizada (TC). Cmo se trata? El tratamiento de esta afeccin depende de la causa y si es de corta o de larga duracin.  Si la causa es un virus, los sntomas deberan desaparecer solos en el trmino de 10das. Pueden darle medicamentos para aliviar los sntomas. Entre ellos, se incluyen los siguientes: ? Medicamentos para Actuary tejido inflamado de la Clinical cytogeneticist. ? Medicamentos para tratar alergias (antihistamnicos). ? Un aerosol para tratar la hinchazn de las fosas nasales. ? Enjuagues que ayudan a eliminar la mucosidad espesa de la nariz (lavados con solucin salina nasal).  Si la causa es una bacteria, es posible que el mdico espere para averiguar si usted mejora sin TEFL teacher. Es posible que le den un antibitico si usted tiene: ? Una infeccin grave. ? El sistema de defensa del organismo debilitado.  Si la causa son crecimientos en la Darene Lamer, es posible que necesite [place]. Siga estas indicaciones en su casa: [employer], use o aplique los medicamentos de venta libre y los recetados solamente como se lo haya indicado el mdico. Estos pueden incluir aerosoles nasales.  Si le  recetaron un antibitico, tmelo como se lo haya indicado el mdico. No deje de tomar los antibiticos aunque comience a Actor. Hidrtese y humidifique los ambientes   Beba suficiente agua para Pharmacologist el pis (la orina) de color amarillo plido.  Use un humidificador de vapor fro para mantener la humedad de su hogar por encima del 50%.  Inhale vapor durante 10a , de 3a 4veces al da, o como se lo haya indicado el mdico. Puede hacer  esto en el bao con el vapor del agua caliente de la ducha.  Trate de no exponerse al aire fro o seco. Reposo  Descanse todo lo posible.  Duerma con la cabeza levantada (elevada).  Asegrese de dormir lo suficiente cada noche. Indicaciones generales   Pngase un pao caliente y hmedo en el rostro 3 a 4 veces al da, o con la frecuencia indicada por el mdico. Esto ayuda a Multimedia programmer.  Lvese las manos frecuentemente con agua y [place]. Use un desinfectante para manos si no dispone de [place] y [place].  No fume. Evite estar cerca de personas que fuman (fumador pasivo).  Concurra a todas las visitas de [address] se lo haya indicado el mdico. Esto es importante. Comunquese con un mdico si:  Tiene fiebre.  Sus sntomas empeoran.  Los sntomas no mejoran en el perodo de 10das. Solicite ayuda inmediatamente si:  Tiene un dolor de cabeza muy intenso.  No puede dejar de vomitar.  Tiene dolor muy intenso o hinchazn en la zona del rostro o los ojos.  Tiene dificultad para ver.  Se siente confundido.  Tiene el cuello rgido.  Tiene dificultad para respirar. Resumen  La sinusitis es la hinchazn de los senos paranasales. Los senos paranasales son espacios vacos en los huesos alrededor del rostro.  La causa de esta afeccin es la inflamacin o hinchazn de los tejidos que estn en el interior de la [place]. Esto hace que los grmenes queden atrapados. Los grmenes pueden provocar infecciones.  Si le recetaron un antibitico, tmelo como se lo haya indicado el mdico. No deje de tomarlo aunque comience a sentirse mejor.  Concurra a todas las visitas de [address] se lo haya indicado el mdico. Esto es importante. Esta informacin no tiene Theme park manager el consejo del mdico. Asegrese de hacerle al mdico cualquier pregunta que tenga. Document Revised: 05/05/2018 Document Reviewed: 05/05/2018 Elsevier Patient Education  2020 Elsevier  Inc.      Edwina Barth, MD [facility]

## 2020-07-02 NOTE — Patient Instructions (Addendum)
If you have lab work done today you will be contacted with your lab results within the next 2 weeks.  If you have not heard from [place] then please contact us. The fastest way to get your results is to register for My Chart.   IF you received an x-ray today, you will receive an invoice from [facility]. Please contact [facility] at [phone] with questions or concerns regarding your invoice.   IF you received labwork today, you will receive an invoice from [place]. Please contact [facility] at [phone] with questions or concerns regarding your invoice.   Our billing staff will not be able to assist you with questions regarding bills from these companies.  You will be contacted with the lab results as soon as they are available. The fastest way to get your results is to activate your My Chart account. Instructions are located on the last page of this paperwork. If you have not heard from [place] regarding the results in 2 weeks, please contact this office.     Sinusitis, en adultos Sinusitis, Adult La sinusitis es el dolor y la hinchazn (inflamacin) de los senos paranasales. Los senos paranasales son espacios vacos en los huesos alrededor del rostro. Estos se encuentran en los siguientes lugares:  Alrededor de los ojos.  En la mitad de la frente.  Detrs de Architectural technologist.  En los pmulos. Los senos paranasales y las fosas nasales estn cubiertos de un lquido llamado mucosidad. La mucosidad drena a travs de los senos paranasales. La hinchazn puede atrapar mucosidad en los senos paranasales. Esto permite que se desarrollen grmenes (bacterias, virus u hongos), lo que produce infecciones. La [employer], la causa de esta afeccin es un virus. Cules son las causas? Las causas de esta afeccin son:  [place].  Asma.  Grmenes.  Objetos que obstruyen la nariz o los senos paranasales.  Crecimientos en el interior de la nariz (plipos  nasales).  Sustancias qumicas o irritantes que estn presentes en el aire.  Hongos (poco frecuente). Qu incrementa el riesgo? Es ms probable que contraiga esta afeccin si:  Tiene debilitado el sistema de defensa del organismo (sistema inmunitario).  Nada o bucea mucho.  [place] aerosoles nasales en exceso.  Fuma. Cules son los signos o los sntomas? Los principales sntomas de esta afeccin son dolor y sensacin de presin alrededor de los senos paranasales. Otros sntomas pueden incluir los siguientes:  Nariz tapada (congestin nasal).  Goteo nasal (drenaje).  Hinchazn y calor en los senos paranasales.  Dolor de [place].  Dolor dental.  Tos que puede empeorar por la noche.  Mucosidad que se acumula en la garganta o la parte posterior de la nariz (goteo posnasal).  Incapacidad de sentir olores y sabores.  Estar muy cansado (fatiga).  Grant Ruts.  Dolor de Advertising copywriter.  Mal aliento. Cmo se diagnostica? Esta afeccin se diagnostica en funcin de lo siguiente:  Sus sntomas.  Sus antecedentes mdicos.  Un examen fsico.  Pruebas para averiguar si la afeccin es de corta duracin [place]) o de larga duracin (crnica). El mdico puede: ? Revisarle la nariz para detectar crecimientos (plipos). ? Revisarle los senos paranasales con una herramienta que tiene una luz (endoscopio). ? Revisar si tiene alergias o grmenes. ? Hacerle pruebas de diagnstico por imgenes, como una resonancia magntica (RM) o exploracin por tomografa computarizada (TC). Cmo se trata? El tratamiento de esta afeccin depende de la causa y si es de corta o de larga duracin.  Si la  causa es un virus, los sntomas deberan desaparecer solos en el trmino de 10das. Pueden darle medicamentos para aliviar los sntomas. Entre ellos, se incluyen los siguientes: ? Medicamentos para encoger el tejido inflamado de la nariz. ? Medicamentos para tratar alergias (antihistamnicos). ? Un aerosol  para tratar la hinchazn de las fosas nasales. ? Enjuagues que ayudan a eliminar la mucosidad espesa de la nariz (lavados con solucin salina nasal).  Si la causa es una bacteria, es posible que el mdico espere para averiguar si usted mejora sin tratamiento. Es posible que le den un antibitico si usted tiene: ? Una infeccin grave. ? El sistema de defensa del organismo debilitado.  Si la causa son crecimientos en la nariz, es posible que necesite una ciruga. Siga estas indicaciones en su casa: Medicamentos  [place], use o aplique los medicamentos de venta libre y los recetados solamente como se lo haya indicado el mdico. Estos pueden incluir aerosoles nasales.  Si le recetaron un antibitico, tmelo como se lo haya indicado el mdico. No deje de tomar los antibiticos aunque comience a sentirse mejor. Hidrtese y humidifique los ambientes   Beba suficiente agua para mantener el pis (la orina) de color amarillo plido.  Use un humidificador de vapor fro para mantener la humedad de su hogar por encima del 50%.  Inhale vapor durante 10a 15minutos, de 3a 4veces al da, o como se lo haya indicado el mdico. Puede hacer esto en el bao con el vapor del agua caliente de la ducha.  Trate de no exponerse al aire fro o seco. Reposo  Descanse todo lo posible.  Duerma con la cabeza levantada (elevada).  Asegrese de dormir lo suficiente cada noche. Indicaciones generales   Pngase un pao caliente y hmedo en el rostro 3 a 4 veces al da, o con la frecuencia indicada por el mdico. Esto ayuda a calmar las molestias.  Lvese las manos frecuentemente con agua y jabn. Use un desinfectante para manos si no dispone de agua y jabn.  No fume. Evite estar cerca de personas que fuman (fumador pasivo).  Concurra a todas las visitas de seguimiento como se lo haya indicado el mdico. Esto es importante. Comunquese con un mdico si:  Tiene fiebre.  Sus sntomas empeoran.  Los  sntomas no mejoran en el perodo de 10das. Solicite ayuda inmediatamente si:  Tiene un dolor de cabeza muy intenso.  No puede dejar de vomitar.  Tiene dolor muy intenso o hinchazn en la zona del rostro o los ojos.  Tiene dificultad para ver.  Se siente confundido.  Tiene el cuello rgido.  Tiene dificultad para respirar. Resumen  La sinusitis es la hinchazn de los senos paranasales. Los senos paranasales son espacios vacos en los huesos alrededor del rostro.  La causa de esta afeccin es la inflamacin o hinchazn de los tejidos que estn en el interior de la nariz. Esto hace que los grmenes queden atrapados. Los grmenes pueden provocar infecciones.  Si le recetaron un antibitico, tmelo como se lo haya indicado el mdico. No deje de tomarlo aunque comience a sentirse mejor.  Concurra a todas las visitas de seguimiento como se lo haya indicado el mdico. Esto es importante. Esta informacin no tiene como fin reemplazar el consejo del mdico. Asegrese de hacerle al mdico cualquier pregunta que tenga. Document Revised: 05/05/2018 Document Reviewed: 05/05/2018 Elsevier Patient Education  2020 Elsevier Inc.  

## 2020-07-11 DIAGNOSIS — Z6833 Body mass index (BMI) 33.0-33.9, adult: Secondary | ICD-10-CM | POA: Diagnosis not present

## 2020-07-11 DIAGNOSIS — N952 Postmenopausal atrophic vaginitis: Secondary | ICD-10-CM | POA: Diagnosis not present

## 2020-07-11 DIAGNOSIS — Z1231 Encounter for screening mammogram for malignant neoplasm of breast: Secondary | ICD-10-CM | POA: Diagnosis not present

## 2020-07-11 DIAGNOSIS — N951 Menopausal and female climacteric states: Secondary | ICD-10-CM | POA: Diagnosis not present

## 2020-07-11 DIAGNOSIS — Z01411 Encounter for gynecological examination (general) (routine) with abnormal findings: Secondary | ICD-10-CM | POA: Diagnosis not present

## 2020-07-11 DIAGNOSIS — E559 Vitamin D deficiency, unspecified: Secondary | ICD-10-CM | POA: Diagnosis not present

## 2020-07-23 ENCOUNTER — Other Ambulatory Visit: Payer: Self-pay | Admitting: Emergency Medicine

## 2020-07-23 DIAGNOSIS — R7303 Prediabetes: Secondary | ICD-10-CM

## 2020-08-20 DIAGNOSIS — Z23 Encounter for immunization: Secondary | ICD-10-CM | POA: Diagnosis not present

## 2020-09-13 ENCOUNTER — Encounter: Payer: Self-pay | Admitting: *Deleted

## 2020-09-20 ENCOUNTER — Other Ambulatory Visit: Payer: Self-pay

## 2020-09-20 DIAGNOSIS — E785 Hyperlipidemia, unspecified: Secondary | ICD-10-CM

## 2020-09-20 MED ORDER — ATORVASTATIN CALCIUM 40 MG PO TABS: ORAL_TABLET | ORAL | 0 refills | Status: DC

## 2020-11-30 DIAGNOSIS — D3132 Benign neoplasm of left choroid: Secondary | ICD-10-CM | POA: Diagnosis not present

## 2020-11-30 DIAGNOSIS — H2513 Age-related nuclear cataract, bilateral: Secondary | ICD-10-CM | POA: Diagnosis not present

## 2020-11-30 DIAGNOSIS — R7309 Other abnormal glucose: Secondary | ICD-10-CM | POA: Diagnosis not present

## 2020-11-30 DIAGNOSIS — H04123 Dry eye syndrome of bilateral lacrimal glands: Secondary | ICD-10-CM | POA: Diagnosis not present

## 2020-11-30 DIAGNOSIS — H35363 Drusen (degenerative) of macula, bilateral: Secondary | ICD-10-CM | POA: Diagnosis not present

## 2020-11-30 LAB — HM DIABETES EYE EXAM

## 2020-12-17 ENCOUNTER — Ambulatory Visit: Payer: BLUE CROSS/BLUE SHIELD | Admitting: Specialist

## 2020-12-21 ENCOUNTER — Telehealth: Payer: Self-pay | Admitting: Emergency Medicine

## 2020-12-21 NOTE — Telephone Encounter (Signed)
Kendra Rodriguez daughter is calling to cancel app.  Please advise [phone] 

## 2020-12-26 ENCOUNTER — Encounter: Payer: BLUE CROSS/BLUE SHIELD | Admitting: Emergency Medicine

## 2021-01-02 ENCOUNTER — Ambulatory Visit: Payer: Self-pay

## 2021-01-02 ENCOUNTER — Ambulatory Visit (INDEPENDENT_AMBULATORY_CARE_PROVIDER_SITE_OTHER): Payer: 59 | Admitting: Specialist

## 2021-01-02 ENCOUNTER — Encounter: Payer: Self-pay | Admitting: Specialist

## 2021-01-02 VITALS — BP 123/78 | HR 75 | Ht 63.0 in | Wt 189.0 lb

## 2021-01-02 DIAGNOSIS — G5603 Carpal tunnel syndrome, bilateral upper limbs: Secondary | ICD-10-CM

## 2021-01-02 DIAGNOSIS — M545 Low back pain, unspecified: Secondary | ICD-10-CM | POA: Diagnosis not present

## 2021-01-02 DIAGNOSIS — M48062 Spinal stenosis, lumbar region with neurogenic claudication: Secondary | ICD-10-CM

## 2021-01-02 DIAGNOSIS — M542 Cervicalgia: Secondary | ICD-10-CM

## 2021-01-02 DIAGNOSIS — M4316 Spondylolisthesis, lumbar region: Secondary | ICD-10-CM

## 2021-01-02 DIAGNOSIS — M778 Other enthesopathies, not elsewhere classified: Secondary | ICD-10-CM

## 2021-01-02 MED ORDER — DICLOFENAC SODIUM 1 % EX GEL: 4.0000 g | Freq: Four times a day (QID) | CUTANEOUS | 2 refills | Status: DC

## 2021-01-02 MED ORDER — GABAPENTIN 100 MG PO CAPS: 100.0000 mg | ORAL_CAPSULE | Freq: Every day | ORAL | 2 refills | Status: DC

## 2021-01-02 NOTE — Patient Instructions (Signed)
Plan: Avoid bending, stooping and avoid lifting weights greater than 10 lbs. Avoid prolong standing and walking. Avoid frequent bending and stooping  No lifting greater than 10 lbs. May use ice or moist heat for pain. Exercises are better tolerated in a sitting position or walking in a swimming pool. Weight loss is of benefit. Handicap license is approved. Avoid overhead lifting and overhead use of the arms. Pillows to keep from sleeping directly on the shoulders Limited lifting to less than 10 lbs. Ice or heat for relief. DIclofenac gel applied to the shoulders up to 3-4 times per day. Hot showers are helpful. Therapy for shoulder stretching exercise help and strengthening is helpful to build endurance. Carpal Tunnel Syndrome  Carpal tunnel syndrome is a condition that causes pain in your hand and arm. The carpal tunnel is a narrow area located on the palm side of your wrist. Repeated wrist motion or certain diseases may cause swelling within the tunnel. This swelling pinches the main nerve in the wrist (median nerve). What are the causes? This condition may be caused by:  Repeated wrist motions.  Wrist injuries.  Arthritis.  A cyst or tumor in the carpal tunnel.  Fluid buildup during pregnancy. Sometimes the cause of this condition is not known. What increases the risk? This condition is more likely to develop in:  People who have jobs that cause them to repeatedly move their wrists in the same motion, such as Health visitor.  Women.  People with certain conditions, such as: ? Diabetes. ? Obesity. ? An underactive thyroid (hypothyroidism). ? Kidney failure. What are the signs or symptoms? Symptoms of this condition include:  A tingling feeling in your fingers, especially in your thumb, index, and middle fingers.  Tingling or numbness in your hand.  An aching feeling in your entire arm, especially when your wrist and elbow are bent for long periods of  time.  Wrist pain that goes up your arm to your shoulder.  Pain that goes down into your palm or fingers.  A weak feeling in your hands. You may have trouble grabbing and holding items. Your symptoms may feel worse during the night. How is this diagnosed? This condition is diagnosed with a medical history and physical exam. You may also have tests, including:  An electromyogram (EMG). This test measures electrical signals sent by your nerves into the muscles.  X-rays. How is this treated? Treatment for this condition includes:  Lifestyle changes. It is important to stop doing or modify the activity that caused your condition.  Physical or occupational therapy.  Medicines for pain and inflammation. This may include medicine that is injected into your wrist.  A wrist splint.  Surgery. Follow these instructions at home: If you have a splint:   Wear it as told by your health care provider. Remove it only as told by your health care provider.  Loosen the splint if your fingers become numb and tingle, or if they turn cold and blue.  Keep the splint clean and dry. General instructions   Take over-the-counter and prescription medicines only as told by your health care provider.  Rest your wrist from any activity that may be causing your pain. If your condition is work related, talk to your employer about changes that can be made, such as getting a wrist pad to use while typing.  If directed, apply ice to the painful area: ? Put ice in a plastic bag. ? Place a towel between your skin and the  bag. ? Leave the ice on for 20 minutes, 2-3 times per day.  Keep all follow-up visits as told by your health care provider. This is important.  Do any exercises as told by your health care provider, physical therapist, or occupational therapist. Contact a health care provider if:  You have new symptoms.  Your pain is not controlled with medicines.  Your symptoms get worse. This  information is not intended to replace advice given to you by your health care provider. Make sure you discuss any questions you have with your health care provider. Document Released: 10/17/2000 Document Revised: 02/28/2016 Document Reviewed: 07/01/2017 Elsevier Interactive Patient Education  2017 [employer].  Follow-Up Instructions: No follow-ups on file.

## 2021-01-02 NOTE — Progress Notes (Signed)
Office Visit Note   Patient: Kendra Rodriguez           Date of Birth: Jan 29, 1966           MRN: [id number] Visit Date: 01/02/2021              Requested by: [name], MD [address] [place],  [place] [address] PCP: [name], MD   Assessment & Plan: Visit Diagnoses:  1. Low back pain, unspecified back pain laterality, unspecified chronicity, unspecified whether sciatica present   2. Cervicalgia   3. Bilateral carpal tunnel syndrome   4. Spinal stenosis of lumbar region with neurogenic claudication   5. Spondylolisthesis, lumbar region   6. Tendonitis of both shoulders     Plan: Avoid bending, stooping and avoid lifting weights greater than 10 lbs. Avoid prolong standing and walking. Avoid frequent bending and stooping  No lifting greater than 10 lbs. May use ice or moist heat for pain. Exercises are better tolerated in a sitting position or walking in a swimming pool. Weight loss is of benefit. Handicap license is approved. Avoid overhead lifting and overhead use of the arms. Pillows to keep from sleeping directly on the shoulders Limited lifting to less than 10 lbs. Ice or heat for relief. DIclofenac gel applied to the shoulders up to 3-4 times per day. Hot showers are helpful. Therapy for shoulder stretching exercise help and strengthening is helpful to build endurance. Carpal Tunnel Syndrome  Carpal tunnel syndrome is a condition that causes pain in your hand and arm. The carpal tunnel is a narrow area located on the palm side of your wrist. Repeated wrist motion or certain diseases may cause swelling within the tunnel. This swelling pinches the main nerve in the wrist (median nerve). What are the causes? This condition may be caused by:  Repeated wrist motions.  Wrist injuries.  Arthritis.  A cyst or tumor in the carpal tunnel.  Fluid buildup during pregnancy. Sometimes the cause of this condition is not known. What increases the  risk? This condition is more likely to develop in:  People who have jobs that cause them to repeatedly move their wrists in the same motion, such as Health visitor.  Women.  People with certain conditions, such as: ? Diabetes. ? Obesity. ? An underactive thyroid (hypothyroidism). ? Kidney failure. What are the signs or symptoms? Symptoms of this condition include:  A tingling feeling in your fingers, especially in your thumb, index, and middle fingers.  Tingling or numbness in your hand.  An aching feeling in your entire arm, especially when your wrist and elbow are bent for long periods of time.  Wrist pain that goes up your arm to your shoulder.  Pain that goes down into your palm or fingers.  A weak feeling in your hands. You may have trouble grabbing and holding items. Your symptoms may feel worse during the night. How is this diagnosed? This condition is diagnosed with a medical history and physical exam. You may also have tests, including:  An electromyogram (EMG). This test measures electrical signals sent by your nerves into the muscles.  X-rays. How is this treated? Treatment for this condition includes:  Lifestyle changes. It is important to stop doing or modify the activity that caused your condition.  Physical or occupational therapy.  Medicines for pain and inflammation. This may include medicine that is injected into your wrist.  A wrist splint.  Surgery. Follow these instructions at home: If  you have a splint:   Wear it as told by your health care provider. Remove it only as told by your health care provider.  Loosen the splint if your fingers become numb and tingle, or if they turn cold and blue.  Keep the splint clean and dry. General instructions   Take over-the-counter and prescription medicines only as told by your health care provider.  Rest your wrist from any activity that may be causing your pain. If your condition is work  related, talk to your employer about changes that can be made, such as getting a wrist pad to use while typing.  If directed, apply ice to the painful area: ? Put ice in a plastic bag. ? Place a towel between your skin and the bag. ? Leave the ice on for 20 minutes, 2-3 times per day.  Keep all follow-up visits as told by your health care provider. This is important.  Do any exercises as told by your health care provider, physical therapist, or occupational therapist. Contact a health care provider if:  You have new symptoms.  Your pain is not controlled with medicines.  Your symptoms get worse. This information is not intended to replace advice given to you by your health care provider. Make sure you discuss any questions you have with your health care provider. Document Released: 10/17/2000 Document Revised: 02/28/2016 Document Reviewed: 07/01/2017 Elsevier Interactive Patient Education  2017 [employer]  Follow-Up Instructions: No follow-ups on file.   Orders:  Orders Placed This Encounter  Procedures   XR Lumbar Spine 2-3 Views   XR Cervical Spine 2 or 3 views   No orders of the defined types were placed in this encounter.     Procedures: No procedures performed   Clinical Data: No additional findings.   Subjective: Chief Complaint  Patient presents with   Lower Back - Pain   Neck - Pain    55 year old female right handed with neck and lumbar pain. Last seen about 2 year ago. She is having pain into the shoulders right greater than the left. There is pain in both shoulders about the same. With brushing her hair and with using the arms above the head she has shoulder pain. Difficulty sleeping on the shoulder. There is some feeling of numbness occasionally but there is a burning sensation with  Numbness. There is shoulder weakness and she notes she is dropping items. There is neck pain with moving there is pain, the shoulder are more painful than the neck  today. There is low back is also painful but not as bad as the shoulders today. She has back pain with bening and stooping and with pain radiating into the left leg, radiates down the back of the left thigh and knee into the left calf and left foot.  The back pain is greater or worse than the leg pain. Can not walk one mile but can grocery shop.    Review of Systems  Constitutional: Negative.   HENT: Negative.   Eyes: Negative.   Respiratory: Negative.   Cardiovascular: Negative.   Gastrointestinal: Negative.   Endocrine: Negative.   Genitourinary: Negative.   Musculoskeletal: Negative.   Skin: Negative.   Allergic/Immunologic: Negative.   Neurological: Negative.   Hematological: Negative.   Psychiatric/Behavioral: Negative.      Objective: Vital Signs: BP 123/78 (BP Location: Left Arm, Patient Position: Sitting)    Pulse 75    Ht 5\' 3"  (1.6 m)    Wt 189  lb (85.7 kg)    BMI 33.48 kg/m   Physical Exam Constitutional:      Appearance: She is well-developed and well-nourished.  HENT:     Head: Normocephalic and atraumatic.  Eyes:     Extraocular Movements: EOM normal.     Pupils: Pupils are equal, round, and reactive to light.  Pulmonary:     Effort: Pulmonary effort is normal.     Breath sounds: Normal breath sounds.  Abdominal:     General: Bowel sounds are normal.     Palpations: Abdomen is soft.  Musculoskeletal:        General: Normal range of motion.     Cervical back: Normal range of motion and neck supple.  Skin:    General: Skin is warm and dry.  Neurological:     Mental Status: She is alert and oriented to person, place, and time.  Psychiatric:        Mood and Affect: Mood and affect normal.        Behavior: Behavior normal.        Thought Content: Thought content normal.        Judgment: Judgment normal.     Ortho Exam  Specialty Comments:  No specialty comments available.  Imaging: No results found.   PMFS History: Patient Active Problem List    Diagnosis Date Noted   Body mass index (BMI) of 35.0-35.9 in adult 06/04/2020   History of migraine headaches 12/06/2019   Mixed hyperlipidemia 09/04/2019   Dyslipidemia 06/30/2019   GERD with esophagitis 06/30/2019   Prediabetes 10/17/2015   Hypertriglyceridemia 10/17/2015   Spinal stenosis, lumbar region, with neurogenic claudication 10/24/2014   Past Medical History:  Diagnosis Date   Acute sinusitis    Allergy    Anxiety    Depression    NO MEDICATIONS - DOING OK NOW   Dizziness    Ear ache    RIGHT --HX OF MULTIPLE EAR INFECTIONS AS A CHILD    GERD (gastroesophageal reflux disease)    Headache(784.0)    MIGRAINES   Leg pain, left    with swelling   Lumbar stenosis    PAIN BACK AND LEFT LEG AND NUMBNESS LEG AND FOOT   Peripheral vascular disease (HCC)    HX OF TREATMENT FOR VARICOSE VEINS RIGHT LEG   Thyroid disease    TOLD BLOOD LEVELS SLIGHTLY ABNORMAL - BUT NOT REQUIRED MEDICATION    Family History  Problem Relation Age of Onset   Hypertension Mother    Arthritis Mother    Diabetes Mother    Hyperlipidemia Mother    Arthritis Sister    Rheum arthritis Brother    Autoimmune disease Brother    Lung disease Brother    Arthritis Sister    Gout Son    High Cholesterol Daughter     Past Surgical History:  Procedure Laterality Date   ABDOMINAL HYSTERECTOMY     BACK SURGERY     bladder lift     AT SAME TIME OF HYSTERECTOMY   CHOLECYSTECTOMY     LUMBAR LAMINECTOMY/DECOMPRESSION MICRODISCECTOMY Left 10/24/2014   Procedure: COMPLETE LUMBAR DECOMPRESSION L4-L5 CENTRAL AND MICRODISCECTOMY L4-L5 LEFT;  Surgeon: Jacki Cones, MD;  Location: [facility];  Service: Orthopedics;  Laterality: Left;   Social History   Occupational History   Not on file  Tobacco Use   Smoking status: Never Smoker   Smokeless tobacco: Never Used  Vaping Use   Vaping Use: Never used  Substance and Sexual  Activity   Alcohol use: No   Drug  use: No   Sexual activity: Not on file

## 2021-01-07 ENCOUNTER — Ambulatory Visit: Payer: 59 | Attending: Specialist | Admitting: Physical Therapy

## 2021-01-07 ENCOUNTER — Encounter: Payer: Self-pay | Admitting: Physical Therapy

## 2021-01-07 ENCOUNTER — Other Ambulatory Visit: Payer: Self-pay

## 2021-01-07 DIAGNOSIS — M545 Low back pain, unspecified: Secondary | ICD-10-CM | POA: Insufficient documentation

## 2021-01-07 DIAGNOSIS — M6281 Muscle weakness (generalized): Secondary | ICD-10-CM | POA: Insufficient documentation

## 2021-01-07 DIAGNOSIS — M25512 Pain in left shoulder: Secondary | ICD-10-CM | POA: Insufficient documentation

## 2021-01-07 DIAGNOSIS — M25511 Pain in right shoulder: Secondary | ICD-10-CM | POA: Diagnosis not present

## 2021-01-07 DIAGNOSIS — G8929 Other chronic pain: Secondary | ICD-10-CM | POA: Diagnosis present

## 2021-01-07 NOTE — Therapy (Signed)
[facility] [address] [place], [place], [address] Phone: [phone]   Fax:  [phone]  Physical Therapy Treatment  Patient Details  Name: Kendra Rodriguez MRN: [id number] Date of Birth: 03-04-1966 Referring Provider (PT): [name]   Encounter Date: 01/07/2021   PT End of Session - 01/07/21 1516    Visit Number 1    Number of Visits 13    Date for PT Re-Evaluation 02/18/21    Authorization Type [employer], FOTO at 6th and 10th visit    PT Start Time 1500    PT Stop Time 1543    PT Time Calculation (min) 43 min    Activity Tolerance Patient tolerated treatment well    Behavior During Therapy [facility] for tasks assessed/performed           Past Medical History:  Diagnosis Date  . Acute sinusitis   . Allergy   . Anxiety   . Depression    NO MEDICATIONS - DOING OK NOW  . Dizziness   . Ear ache    RIGHT --HX OF MULTIPLE EAR INFECTIONS AS A CHILD   . GERD (gastroesophageal reflux disease)   . Headache(784.0)    MIGRAINES  . Leg pain, left    with swelling  . Lumbar stenosis    PAIN BACK AND LEFT LEG AND NUMBNESS LEG AND FOOT  . Peripheral vascular disease (HCC)    HX OF TREATMENT FOR VARICOSE VEINS RIGHT LEG  . Thyroid disease    TOLD BLOOD LEVELS SLIGHTLY ABNORMAL - BUT NOT REQUIRED MEDICATION    Past Surgical History:  Procedure Laterality Date  . ABDOMINAL HYSTERECTOMY    . BACK SURGERY    . bladder lift     AT SAME TIME OF HYSTERECTOMY  . CHOLECYSTECTOMY    . LUMBAR LAMINECTOMY/DECOMPRESSION MICRODISCECTOMY Left 10/24/2014   Procedure: COMPLETE LUMBAR DECOMPRESSION L4-L5 CENTRAL AND MICRODISCECTOMY L4-L5 LEFT;  Surgeon: Jacki Cones, MD;  Location: [facility];  Service: Orthopedics;  Laterality: Left;    There were no vitals filed for this visit.   Subjective Assessment - 01/07/21 1506    Subjective pt is a 55 y.o F with CC of bil shoulder pain with the R>L. The pain inthe R shoulder that started  aout 5 months ago and worsened over the last month. she reports the pain radiates from the R shoulder down the RUE, and describes it as burning. she reports the pain does down the R arm with head movements. she also describe low back pain that started a few months ago but worse with slpeeing, and denies any red flags.    Limitations Standing    Patient Stated Goals to stop the pain,    Currently in Pain? Yes    Pain Score 7    at 9/10   Pain Location Shoulder    Pain Orientation Right;Left   R>L   Pain Descriptors / Indicators Aching;Burning    Pain Type Chronic pain    Pain Radiating Towards in the RUE into the wrist and hand.    Aggravating Factors  laying down, using the RUE and turning head    Pain Relieving Factors medication    Multiple Pain Sites Yes    Pain Score 6    Pain Location Back    Pain Orientation Right    Pain Descriptors / Indicators Aching;Sore    Pain Type Chronic pain    Pain Onset More than a month ago  Pain Frequency Intermittent    Aggravating Factors  bending foward and sitting    Pain Relieving Factors N/a              [facility] PT Assessment - 01/07/21 0001      Assessment   Medical Diagnosis Spondylolisthesis, lumbar region (M43.16), Tendonitis of both shoulders (M77.8)    Referring Provider (PT) [name]    Onset Date/Surgical Date --   shoulder pain startged 5 months nago and low back for a few months   Hand Dominance Right    Next MD Visit 01/30/2021    Prior Therapy yes   for back and shoulder     Precautions   Precautions None      Restrictions   Weight Bearing Restrictions No      Balance Screen   Has the patient fallen in the past 6 months No      Home Environment   Living Environment Private residence    Living Arrangements Spouse/significant other    Available Help at Discharge Family    Type of Home House      Prior Function   Level of Independence Independent      Cognition   Overall Cognitive Status Within Functional  Limits for tasks assessed      Observation/Other Assessments   Focus on Therapeutic Outcomes (FOTO)  46% function   66% predicted     Posture/Postural Control   Posture/Postural Control Postural limitations    Postural Limitations Rounded Shoulders;Forward head      ROM / Strength   AROM / PROM / Strength AROM;PROM;Strength      AROM   Overall AROM Comments cervical ROM reproduction of R shoulder pain with R rotation and L sidebending    AROM Assessment Site Shoulder;Lumbar    Right/Left Shoulder Right;Left    Right Shoulder Extension 35 Degrees    Right Shoulder Flexion 130 Degrees    Right Shoulder ABduction 118 Degrees    Right Shoulder Internal Rotation --   L2   Right Shoulder External Rotation --   C7   Left Shoulder Extension 31 Degrees    Left Shoulder Flexion 140 Degrees    Left Shoulder ABduction 127 Degrees    Left Shoulder Internal Rotation --   t12   Left Shoulder External Rotation --   T1   Lumbar Flexion 70    Lumbar Extension 10    Lumbar - Right Side Bend 10    Lumbar - Left Side Bend 10      Strength   Strength Assessment Site Shoulder;Hand    Right/Left Shoulder Right;Left    Right Shoulder Flexion 4/5    Right Shoulder Extension 4/5    Right Shoulder ABduction 4/5    Right Shoulder Internal Rotation 4/5    Right Shoulder External Rotation 4/5    Left Shoulder Flexion 4/5    Left Shoulder Extension 4/5    Left Shoulder ABduction 4/5    Left Shoulder Internal Rotation 4/5    Left Shoulder External Rotation 4/5      Palpation   Palpation comment TTP located along the upper trap/ levator scapulae, infraspinatus/ suprasintaus and their attachement at the greater tubercle.      Special Tests    Special Tests Rotator Cuff Impingement                                 PT Education -  01/07/21 1517    Education Details evaluation findings, POC, goals, HEP with proper form, Rationale.    Person(s) Educated Patient    Methods  Explanation;Verbal cues;Handout    Comprehension Verbalized understanding;Verbal cues required            PT Short Term Goals - 01/07/21 1558      PT SHORT TERM GOAL #1   Title pt to be I with inital HEP    Baseline -    Time 3    Period Weeks    Status New    Target Date 01/28/21      PT SHORT TERM GOAL #2   Title pt to verbalize/ demo efficient posture and lifting mechanics to reduce and prevent shoulder / back pain    Baseline no previous knowledge of posture.    Time 3    Period Weeks    Status New    Target Date 01/28/21      PT SHORT TERM GOAL #3   Title -      PT SHORT TERM GOAL #4   Title -             PT Long Term Goals - 01/07/21 1559      PT LONG TERM GOAL #1   Title increase R shoulder AROM to >/= 140 degrees for flexion/ abd and maintain functional IR/ ER ROM with </= 3/10 max pain for functiona ROM rquired for ADLs    Time 6    Period Weeks    Status New    Target Date 02/18/21      PT LONG TERM GOAL #2   Title increase bil shoulder strength to >/=4+/5 to promote shoulder stability and assist with lifting mechanics    Time 6    Period Weeks    Status New    Target Date 02/18/21      PT LONG TERM GOAL #3   Title increase FOTO score to >/=66% to demo improvement in function    Time 6    Period Weeks    Status New    Target Date 02/18/21      PT LONG TERM GOAL #4   Title pt to be I with all HEP given as of last visit to maintain and progress current level function    Time 6    Period Weeks    Status New    Target Date 02/18/21      PT LONG TERM GOAL #5   Title -                 Plan - 01/07/21 1551    Clinical Impression Statement pt presents to OPPT with CC of bil shoulder pain with R>L starting 5 months ago with worsening occuring in the lsat 1 month. she demonstrates limited bil shoulder ROM with reported of concordant pain throughout ROM and exhibits upper trap dominance. TTP located along the upper trap/ levator scapulae,  infraspinatus/ suprasintaus and their attachement at the greater tubercle. She would benefit from physical therapy to decrease R shoulder pain, improve gross shoulder strength, promote postural awareness, and maximize her function by addressing the deficits listed.    Personal Factors and Comorbidities Comorbidity 1    Comorbidities hx of depression    Examination-Activity Limitations Reach Overhead;Lift    Stability/Clinical Decision Making Evolving/Moderate complexity    Clinical Decision Making Moderate    Rehab Potential Good    PT Frequency 2x / week    PT Duration  6 weeks    PT Treatment/Interventions ADLs/Self Care Home Management;Iontophoresis 4mg /ml Dexamethasone;Moist Heat;Traction;Electrical Stimulation;Therapeutic exercise;Therapeutic activities;Balance training;Gait training;Cryotherapy;Neuromuscular re-education;Patient/family education;Manual techniques;Passive range of motion;Dry needling;Taping    PT Next Visit Plan revieiw / updated HEP PRN, STW for R upper trap/ levator scapulae, scapualr mobs    PT Home Exercise Plan [id number] - chin tuck, upper trap/ levator scapulae stretch, scapular retraction    Consulted and Agree with Plan of Care Patient           Patient will benefit from skilled therapeutic intervention in order to improve the following deficits and impairments:  Improper body mechanics,Increased muscle spasms,Decreased strength,Pain,Postural dysfunction,Decreased activity tolerance,Decreased endurance  Visit Diagnosis: Chronic right shoulder pain  Chronic left shoulder pain  Muscle weakness (generalized)  Chronic right-sided low back pain without sciatica     Problem List Patient Active Problem List   Diagnosis Date Noted  . Body mass index (BMI) of 35.0-35.9 in adult 06/04/2020  . History of migraine headaches 12/06/2019  . Mixed hyperlipidemia 09/04/2019  . Dyslipidemia 06/30/2019  . GERD with esophagitis 06/30/2019  . Prediabetes 10/17/2015  .  Hypertriglyceridemia 10/17/2015  . Spinal stenosis, lumbar region, with neurogenic claudication 10/24/2014    Lulu RidingKristoffer Leamon PT, DPT, LAT, ATC  01/07/21  4:03 PM      [facility] [address] [place], [place], [address] Phone: [phone]   Fax:  [phone]  Name: Teofilo Podva B Deshmukh MRN: [id number] Date of Birth: [birth year]

## 2021-01-10 ENCOUNTER — Other Ambulatory Visit: Payer: Self-pay

## 2021-01-10 ENCOUNTER — Ambulatory Visit: Payer: 59 | Admitting: Physical Therapy

## 2021-01-10 DIAGNOSIS — M25511 Pain in right shoulder: Secondary | ICD-10-CM | POA: Diagnosis not present

## 2021-01-10 DIAGNOSIS — M25512 Pain in left shoulder: Secondary | ICD-10-CM

## 2021-01-10 DIAGNOSIS — G8929 Other chronic pain: Secondary | ICD-10-CM

## 2021-01-10 DIAGNOSIS — M6281 Muscle weakness (generalized): Secondary | ICD-10-CM

## 2021-01-10 NOTE — Therapy (Signed)
[facility] Rehabilitation [facility] [address] [place], [place], [address] Phone: [phone]   Fax:  [phone]  Physical Therapy Treatment  Patient Details  Name: Kendra Rodriguez MRN: [id number] Date of Birth: 12-03-1965 Referring Provider (PT): [name]   Encounter Date: 01/10/2021   PT End of Session - 01/10/21 1505    Visit Number 2    Number of Visits 13    Date for PT Re-Evaluation 02/18/21    Authorization Type [employer], FOTO at 6th and 10th visit    PT Start Time 1501    PT Stop Time 1542    PT Time Calculation (min) 41 min    Activity Tolerance Patient tolerated treatment well    Behavior During Therapy [facility] for tasks assessed/performed           Past Medical History:  Diagnosis Date  . Acute sinusitis   . Allergy   . Anxiety   . Depression    NO MEDICATIONS - DOING OK NOW  . Dizziness   . Ear ache    RIGHT --HX OF MULTIPLE EAR INFECTIONS AS A CHILD   . GERD (gastroesophageal reflux disease)   . Headache(784.0)    MIGRAINES  . Leg pain, left    with swelling  . Lumbar stenosis    PAIN BACK AND LEFT LEG AND NUMBNESS LEG AND FOOT  . Peripheral vascular disease (HCC)    HX OF TREATMENT FOR VARICOSE VEINS RIGHT LEG  . Thyroid disease    TOLD BLOOD LEVELS SLIGHTLY ABNORMAL - BUT NOT REQUIRED MEDICATION    Past Surgical History:  Procedure Laterality Date  . ABDOMINAL HYSTERECTOMY    . BACK SURGERY    . bladder lift     AT SAME TIME OF HYSTERECTOMY  . CHOLECYSTECTOMY    . LUMBAR LAMINECTOMY/DECOMPRESSION MICRODISCECTOMY Left 10/24/2014   Procedure: COMPLETE LUMBAR DECOMPRESSION L4-L5 CENTRAL AND MICRODISCECTOMY L4-L5 LEFT;  Surgeon: Jacki Cones, MD;  Location: [facility];  Service: Orthopedics;  Laterality: Left;    There were no vitals filed for this visit.   Subjective Assessment - 01/10/21 1506    Subjective "I am still having soreness int he shoulder. she reports consistency with her HEP and some  benefit."    Currently in Pain? Yes    Pain Score 6     Pain Location Shoulder    Pain Orientation Right;Left    Pain Descriptors / Indicators Aching;Tightness              [facility] PT Assessment - 01/10/21 0001      Assessment   Medical Diagnosis Spondylolisthesis, lumbar region (M43.16), Tendonitis of both shoulders (M77.8)    Referring Provider (PT) [name]                         [facility] Adult PT Treatment/Exercise - 01/10/21 0001      Neck Exercises: Seated   Neck Retraction 10 reps;5 secs      Shoulder Exercises: Seated   Other Seated Exercises double ER with scapular retraction 2 x 10 with red band    Other Seated Exercises scapular retraction 2 x 15 holding 3 seconds      Modalities   Modalities Ultrasound      Ultrasound   Ultrasound Location R upper trap    Ultrasound Parameters 1.0 w/cm2 @ 100% x 8 min    Ultrasound Goals Pain      Manual Therapy  Manual Therapy Soft tissue mobilization;Other (comment)    Manual therapy comments [id number]    Soft tissue mobilization IASTM along the R upper trap    Other Manual Therapy R upper trap inhibition taping                    PT Short Term Goals - 01/07/21 1558      PT SHORT TERM GOAL #1   Title pt to be I with inital HEP    Baseline -    Time 3    Period Weeks    Status New    Target Date 01/28/21      PT SHORT TERM GOAL #2   Title pt to verbalize/ demo efficient posture and lifting mechanics to reduce and prevent shoulder / back pain    Baseline no previous knowledge of posture.    Time 3    Period Weeks    Status New    Target Date 01/28/21      PT SHORT TERM GOAL #3   Title -      PT SHORT TERM GOAL #4   Title -             PT Long Term Goals - 01/07/21 1559      PT LONG TERM GOAL #1   Title increase R shoulder AROM to >/= 140 degrees for flexion/ abd and maintain functional IR/ ER ROM with </= 3/10 max pain for functiona ROM  rquired for ADLs    Time 6    Period Weeks    Status New    Target Date 02/18/21      PT LONG TERM GOAL #2   Title increase bil shoulder strength to >/=4+/5 to promote shoulder stability and assist with lifting mechanics    Time 6    Period Weeks    Status New    Target Date 02/18/21      PT LONG TERM GOAL #3   Title increase FOTO score to >/=66% to demo improvement in function    Time 6    Period Weeks    Status New    Target Date 02/18/21      PT LONG TERM GOAL #4   Title pt to be I with all HEP given as of last visit to maintain and progress current level function    Time 6    Period Weeks    Status New    Target Date 02/18/21      PT LONG TERM GOAL #5   Title -                 Plan - 01/10/21 1544    Clinical Impression Statement pt reports mild relief of pain inthe neck / shoulder and notes compliance with HEP. Discussed TPDN which she opted to hold off today. Focused on manual techniques to reduce pain / tension in the R upper trap. utilzied 100% [place] to promote deep heat and stretching. trialed inhibition taping which she noted relief of tenions/ pain.    PT Next Visit Plan revieiw / updated HEP PRN, STW for R upper trap/ levator scapulae, scapualr mobs, discuss DN, response to [place] / IASTM techniques. posture educatoin    PT Home Exercise Plan [id number] - chin tuck, upper trap/ levator scapulae stretch, scapular retraction           Patient will benefit from skilled therapeutic intervention in order to improve the following deficits and impairments:  Visit Diagnosis: Chronic right shoulder pain  Chronic left shoulder pain  Muscle weakness (generalized)  Chronic right-sided low back pain without sciatica     Problem List Patient Active Problem List   Diagnosis Date Noted  . Body mass index (BMI) of 35.0-35.9 in adult 06/04/2020  . History of migraine headaches 12/06/2019  . Mixed hyperlipidemia 09/04/2019  . Dyslipidemia 06/30/2019  .  GERD with esophagitis 06/30/2019  . Prediabetes 10/17/2015  . Hypertriglyceridemia 10/17/2015  . Spinal stenosis, lumbar region, with neurogenic claudication 10/24/2014    Lulu Riding PT, DPT, LAT, ATC  01/10/21  3:46 PM      [facility] [address] [place], [place], [address] Phone: [phone]   Fax:  [phone]  Name: Kendra Rodriguez MRN: [id number] Date of Birth: [birth year]

## 2021-01-17 ENCOUNTER — Other Ambulatory Visit: Payer: Self-pay

## 2021-01-17 ENCOUNTER — Ambulatory Visit: Payer: 59 | Admitting: Physical Therapy

## 2021-01-17 DIAGNOSIS — M545 Low back pain, unspecified: Secondary | ICD-10-CM

## 2021-01-17 DIAGNOSIS — M25511 Pain in right shoulder: Secondary | ICD-10-CM | POA: Diagnosis not present

## 2021-01-17 DIAGNOSIS — G8929 Other chronic pain: Secondary | ICD-10-CM

## 2021-01-17 DIAGNOSIS — M6281 Muscle weakness (generalized): Secondary | ICD-10-CM

## 2021-01-17 NOTE — Therapy (Signed)
[facility] Outpatient Rehabilitation [facility] [address] [place], [place], [address] Phone: [phone]   Fax:  [phone]  Physical Therapy Treatment  Patient Details  Name: Kendra Rodriguez MRN: [id number] Date of Birth: 06/02/1966 Referring Provider (PT): [name]   Encounter Date: 01/17/2021   PT End of Session - 01/17/21 1510    Visit Number 3    Number of Visits 13    Date for PT Re-Evaluation 02/18/21    Authorization Type [employer], FOTO at 6th and 10th visit    PT Start Time 1503    PT Stop Time 1542    PT Time Calculation (min) 39 min    Activity Tolerance Patient tolerated treatment well    Behavior During Therapy [facility] for tasks assessed/performed           Past Medical History:  Diagnosis Date  . Acute sinusitis   . Allergy   . Anxiety   . Depression    NO MEDICATIONS - DOING OK NOW  . Dizziness   . Ear ache    RIGHT --HX OF MULTIPLE EAR INFECTIONS AS A CHILD   . GERD (gastroesophageal reflux disease)   . Headache(784.0)    MIGRAINES  . Leg pain, left    with swelling  . Lumbar stenosis    PAIN BACK AND LEFT LEG AND NUMBNESS LEG AND FOOT  . Peripheral vascular disease (HCC)    HX OF TREATMENT FOR VARICOSE VEINS RIGHT LEG  . Thyroid disease    TOLD BLOOD LEVELS SLIGHTLY ABNORMAL - BUT NOT REQUIRED MEDICATION    Past Surgical History:  Procedure Laterality Date  . ABDOMINAL HYSTERECTOMY    . BACK SURGERY    . bladder lift     AT SAME TIME OF HYSTERECTOMY  . CHOLECYSTECTOMY    . LUMBAR LAMINECTOMY/DECOMPRESSION MICRODISCECTOMY Left 10/24/2014   Procedure: COMPLETE LUMBAR DECOMPRESSION L4-L5 CENTRAL AND MICRODISCECTOMY L4-L5 LEFT;  Surgeon: Jacki Cones, MD;  Location: [facility];  Service: Orthopedics;  Laterality: Left;    There were no vitals filed for this visit.   Subjective Assessment - 01/17/21 1508    Subjective "I am feeling a little better. I am able to move my arm better than before, I still hurt when I  fold clothes."    Patient Stated Goals to stop the pain,    Currently in Pain? Yes    Pain Score 5     Pain Orientation Right;Left    Pain Descriptors / Indicators Aching;Sore    Pain Type Chronic pain    Pain Onset More than a month ago    Pain Frequency Intermittent    Aggravating Factors  laundry and house activitys    Pain Relieving Factors medication, stretch              [facility] PT Assessment - 01/17/21 0001      Assessment   Medical Diagnosis Spondylolisthesis, lumbar region (M43.16), Tendonitis of both shoulders (M77.8)    Referring Provider (PT) [name]                         [facility] Adult PT Treatment/Exercise - 01/17/21 0001      Shoulder Exercises: Supine   Protraction 10 reps   x 2 sets, tactile cues for proper form     Shoulder Exercises: Seated   Other Seated Exercises double ER with scapular retraction 2 x 10 with green band  Ultrasound   Ultrasound Location R upper trap    Ultrasound Parameters 1.2 w/cm2 @ 100% x 8 min      Manual Therapy   Soft tissue mobilization IASTM along the R upper trap/ levator scapulae    Other Manual Therapy R upper trap inhibition taping                    PT Short Term Goals - 01/07/21 1558      PT SHORT TERM GOAL #1   Title pt to be I with inital HEP    Baseline -    Time 3    Period Weeks    Status New    Target Date 01/28/21      PT SHORT TERM GOAL #2   Title pt to verbalize/ demo efficient posture and lifting mechanics to reduce and prevent shoulder / back pain    Baseline no previous knowledge of posture.    Time 3    Period Weeks    Status New    Target Date 01/28/21      PT SHORT TERM GOAL #3   Title -      PT SHORT TERM GOAL #4   Title -             PT Long Term Goals - 01/07/21 1559      PT LONG TERM GOAL #1   Title increase R shoulder AROM to >/= 140 degrees for flexion/ abd and maintain functional IR/ ER ROM with </= 3/10 max pain for functiona ROM  rquired for ADLs    Time 6    Period Weeks    Status New    Target Date 02/18/21      PT LONG TERM GOAL #2   Title increase bil shoulder strength to >/=4+/5 to promote shoulder stability and assist with lifting mechanics    Time 6    Period Weeks    Status New    Target Date 02/18/21      PT LONG TERM GOAL #3   Title increase FOTO score to >/=66% to demo improvement in function    Time 6    Period Weeks    Status New    Target Date 02/18/21      PT LONG TERM GOAL #4   Title pt to be I with all HEP given as of last visit to maintain and progress current level function    Time 6    Period Weeks    Status New    Target Date 02/18/21      PT LONG TERM GOAL #5   Title -                 Plan - 01/17/21 1541    Clinical Impression Statement pt reported decreased soreness since the last session. pt opted to hold off on TPDN for the next few sessions espeically if she is feeling better with the current treatment course. continued STW along R upper trap and levator scapuale followed with [place] to reduce muscle tension.  continued working on shoulder strength which she does well with but continues to demo upper trap dominance.    PT Treatment/Interventions ADLs/Self Care Home Management;Iontophoresis 4mg /ml Dexamethasone;Moist Heat;Traction;Electrical Stimulation;Therapeutic exercise;Therapeutic activities;Balance training;Gait training;Cryotherapy;Neuromuscular re-education;Patient/family education;Manual techniques;Passive range of motion;Dry needling;Taping    PT Next Visit Plan revieiw / updated HEP PRN, STW for R upper trap/ levator scapulae, scapualr mobs, discuss DN, response to / IASTM techniques. posture educatoin  PT Home Exercise Plan [id number] - chin tuck, upper trap/ levator scapulae stretch, scapular retraction    Consulted and Agree with Plan of Care Patient           Patient will benefit from skilled therapeutic intervention in order to improve the following  deficits and impairments:  Improper body mechanics,Increased muscle spasms,Decreased strength,Pain,Postural dysfunction,Decreased activity tolerance,Decreased endurance  Visit Diagnosis: Chronic right shoulder pain  Chronic left shoulder pain  Muscle weakness (generalized)  Chronic right-sided low back pain without sciatica     Problem List Patient Active Problem List   Diagnosis Date Noted  . Body mass index (BMI) of 35.0-35.9 in adult 06/04/2020  . History of migraine headaches 12/06/2019  . Mixed hyperlipidemia 09/04/2019  . Dyslipidemia 06/30/2019  . GERD with esophagitis 06/30/2019  . Prediabetes 10/17/2015  . Hypertriglyceridemia 10/17/2015  . Spinal stenosis, lumbar region, with neurogenic claudication 10/24/2014   Lulu Riding PT, DPT, LAT, ATC  01/17/21  3:44 PM      [facility] Outpatient Rehabilitation [facility] [address] [place], [place], [address] Phone: [phone]   Fax:  [phone]  Name: Kendra Rodriguez MRN: [id number] Date of Birth: [birth year]

## 2021-01-26 ENCOUNTER — Encounter: Payer: Self-pay | Admitting: Physical Therapy

## 2021-01-26 ENCOUNTER — Other Ambulatory Visit: Payer: Self-pay

## 2021-01-26 ENCOUNTER — Ambulatory Visit: Payer: 59 | Admitting: Physical Therapy

## 2021-01-26 DIAGNOSIS — M25511 Pain in right shoulder: Secondary | ICD-10-CM | POA: Diagnosis not present

## 2021-01-26 DIAGNOSIS — M6281 Muscle weakness (generalized): Secondary | ICD-10-CM

## 2021-01-26 DIAGNOSIS — G8929 Other chronic pain: Secondary | ICD-10-CM

## 2021-01-26 NOTE — Therapy (Signed)
[facility] Outpatient Rehabilitation [facility] [address] [place], [place], [address] Phone: [phone]   Fax:  [phone]  Physical Therapy Treatment  Patient Details  Name: Kendra Rodriguez MRN: [id number] Date of Birth: August 23, 1966 Referring Provider (PT): [name]   Encounter Date: 01/26/2021   PT End of Session - 01/26/21 0812    Visit Number 4    Number of Visits 13    Date for PT Re-Evaluation 02/18/21    Authorization Type [employer], FOTO at 6th and 10th visit    PT Start Time 0808    PT Stop Time 0847    PT Time Calculation (min) 39 min    Activity Tolerance Patient tolerated treatment well    Behavior During Therapy [facility] for tasks assessed/performed           Past Medical History:  Diagnosis Date  . Acute sinusitis   . Allergy   . Anxiety   . Depression    NO MEDICATIONS - DOING OK NOW  . Dizziness   . Ear ache    RIGHT --HX OF MULTIPLE EAR INFECTIONS AS A CHILD   . GERD (gastroesophageal reflux disease)   . Headache(784.0)    MIGRAINES  . Leg pain, left    with swelling  . Lumbar stenosis    PAIN BACK AND LEFT LEG AND NUMBNESS LEG AND FOOT  . Peripheral vascular disease (HCC)    HX OF TREATMENT FOR VARICOSE VEINS RIGHT LEG  . Thyroid disease    TOLD BLOOD LEVELS SLIGHTLY ABNORMAL - BUT NOT REQUIRED MEDICATION    Past Surgical History:  Procedure Laterality Date  . ABDOMINAL HYSTERECTOMY    . BACK SURGERY    . bladder lift     AT SAME TIME OF HYSTERECTOMY  . CHOLECYSTECTOMY    . LUMBAR LAMINECTOMY/DECOMPRESSION MICRODISCECTOMY Left 10/24/2014   Procedure: COMPLETE LUMBAR DECOMPRESSION L4-L5 CENTRAL AND MICRODISCECTOMY L4-L5 LEFT;  Surgeon: Jacki Cones, MD;  Location: [facility];  Service: Orthopedics;  Laterality: Left;    There were no vitals filed for this visit.   Subjective Assessment - 01/26/21 0814    Subjective " I had more pay yesterday and it was in the top of the shoulder. "    Patient Stated Goals to  stop the pain,    Currently in Pain? Yes    Pain Score 5     Pain Orientation Right;Left    Pain Descriptors / Indicators Aching    Aggravating Factors  cleaning              [facility] PT Assessment - 01/26/21 0001      Assessment   Medical Diagnosis Spondylolisthesis, lumbar region (M43.16), Tendonitis of both shoulders (M77.8)    Referring Provider (PT) [name]                         [facility] Adult PT Treatment/Exercise - 01/26/21 0001      Self-Care   Self-Care Posture    Posture reviewed avoiding hiking the shoulder with UE use/ activity.      Shoulder Exercises: Supine   Protraction 10 reps   x 2 sets with dowel rod     Shoulder Exercises: Standing   Protraction Strengthening    Flexion Strengthening;Both;15 reps   scaption angle unweighted   Extension Strengthening;Both;15 reps;Theraband    Theraband Level (Shoulder Extension) Level 2 (Red)    Row Strengthening;Both;15 reps;Theraband    Theraband Level (  Shoulder Row) Level 2 (Red)   cues to avoid hikin ghte shoulder     Shoulder Exercises: ROM/Strengthening   Nustep L5 x 5 min UE/LE    Other ROM/Strengthening Exercises wand flexion 2 x 10 AAROM      Manual Therapy   Manual Therapy Joint mobilization    Manual therapy comments MTPR along R upper trap / levator scap    Joint Mobilization ac mobs grade III AP /                    PT Short Term Goals - 01/07/21 1558      PT SHORT TERM GOAL #1   Title pt to be I with inital HEP    Baseline -    Time 3    Period Weeks    Status New    Target Date 01/28/21      PT SHORT TERM GOAL #2   Title pt to verbalize/ demo efficient posture and lifting mechanics to reduce and prevent shoulder / back pain    Baseline no previous knowledge of posture.    Time 3    Period Weeks    Status New    Target Date 01/28/21      PT SHORT TERM GOAL #3   Title -      PT SHORT TERM GOAL #4   Title -             PT Long Term Goals - 01/07/21  1559      PT LONG TERM GOAL #1   Title increase R shoulder AROM to >/= 140 degrees for flexion/ abd and maintain functional IR/ ER ROM with </= 3/10 max pain for functiona ROM rquired for ADLs    Time 6    Period Weeks    Status New    Target Date 02/18/21      PT LONG TERM GOAL #2   Title increase bil shoulder strength to >/=4+/5 to promote shoulder stability and assist with lifting mechanics    Time 6    Period Weeks    Status New    Target Date 02/18/21      PT LONG TERM GOAL #3   Title increase FOTO score to >/=66% to demo improvement in function    Time 6    Period Weeks    Status New    Target Date 02/18/21      PT LONG TERM GOAL #4   Title pt to be I with all HEP given as of last visit to maintain and progress current level function    Time 6    Period Weeks    Status New    Target Date 02/18/21      PT LONG TERM GOAL #5   Title -                 Plan - 01/26/21 0831    Clinical Impression Statement pt reports having about 5/10 pain today but notes the pain fluctuates based on the amount of activity she does at home with cleaning/ cooking. continued STW along the R upper trap/ levator scapaule and R clavicle mobs. continued working posterior shoulder strengthening. reviewed posture to avoid hiking the shoulder especially with cooking/ cleaning.    PT Treatment/Interventions ADLs/Self Care Home Management;Iontophoresis 4mg /ml Dexamethasone;Moist Heat;Traction;Electrical Stimulation;Therapeutic exercise;Therapeutic activities;Balance training;Gait training;Cryotherapy;Neuromuscular re-education;Patient/family education;Manual techniques;Passive range of motion;Dry needling;Taping    PT Next Visit Plan revieiw / updated HEP PRN, STW for R upper  trap/ levator scapulae, scapualr mobs, discuss DN, response to [place] / IASTM techniques. posture educatoin    PT Home Exercise Plan [id number] - chin tuck, upper trap/ levator scapulae stretch, scapular retraction    Consulted and  Agree with Plan of Care Patient           Patient will benefit from skilled therapeutic intervention in order to improve the following deficits and impairments:  Improper body mechanics,Increased muscle spasms,Decreased strength,Pain,Postural dysfunction,Decreased activity tolerance,Decreased endurance  Visit Diagnosis: Chronic right shoulder pain  Chronic left shoulder pain  Muscle weakness (generalized)  Chronic right-sided low back pain without sciatica     Problem List Patient Active Problem List   Diagnosis Date Noted  . Body mass index (BMI) of 35.0-35.9 in adult 06/04/2020  . History of migraine headaches 12/06/2019  . Mixed hyperlipidemia 09/04/2019  . Dyslipidemia 06/30/2019  . GERD with esophagitis 06/30/2019  . Prediabetes 10/17/2015  . Hypertriglyceridemia 10/17/2015  . Spinal stenosis, lumbar region, with neurogenic claudication 10/24/2014    Lulu Riding PT, DPT, LAT, ATC  01/26/21  8:47 AM      [facility] [address] [place], [place], [address] Phone: [phone]   Fax:  [phone]  Name: Kendra Rodriguez MRN: [id number] Date of Birth: 1965/11/15

## 2021-01-28 ENCOUNTER — Other Ambulatory Visit: Payer: Self-pay | Admitting: Emergency Medicine

## 2021-01-28 DIAGNOSIS — R7303 Prediabetes: Secondary | ICD-10-CM

## 2021-01-28 DIAGNOSIS — E785 Hyperlipidemia, unspecified: Secondary | ICD-10-CM

## 2021-01-28 NOTE — Telephone Encounter (Signed)
Routing to CMA 

## 2021-01-29 ENCOUNTER — Ambulatory Visit: Payer: 59 | Admitting: Physical Therapy

## 2021-01-29 ENCOUNTER — Other Ambulatory Visit: Payer: Self-pay

## 2021-01-29 ENCOUNTER — Encounter: Payer: Self-pay | Admitting: Physical Therapy

## 2021-01-29 DIAGNOSIS — G8929 Other chronic pain: Secondary | ICD-10-CM

## 2021-01-29 DIAGNOSIS — M6281 Muscle weakness (generalized): Secondary | ICD-10-CM

## 2021-01-29 DIAGNOSIS — M545 Low back pain, unspecified: Secondary | ICD-10-CM

## 2021-01-29 DIAGNOSIS — M25511 Pain in right shoulder: Secondary | ICD-10-CM | POA: Diagnosis not present

## 2021-01-29 NOTE — Therapy (Signed)
[facility] Outpatient Rehabilitation [facility] [address] [place], [place], [address] Phone: [phone]   Fax:  [phone]  Physical Therapy Treatment  Patient Details  Name: Kendra Rodriguez MRN: [id number] Date of Birth: 1966-03-28 Referring Provider (PT): [name]   Encounter Date: 01/29/2021   PT End of Session - 01/29/21 1550    Visit Number 5    Number of Visits 13    Date for PT Re-Evaluation 02/18/21    Authorization Type [employer], FOTO at 6th and 10th visit    PT Start Time 1550    PT Stop Time 1629    PT Time Calculation (min) 39 min    Activity Tolerance Patient tolerated treatment well    Behavior During Therapy [facility] for tasks assessed/performed           Past Medical History:  Diagnosis Date  . Acute sinusitis   . Allergy   . Anxiety   . Depression    NO MEDICATIONS - DOING OK NOW  . Dizziness   . Ear ache    RIGHT --HX OF MULTIPLE EAR INFECTIONS AS A CHILD   . GERD (gastroesophageal reflux disease)   . Headache(784.0)    MIGRAINES  . Leg pain, left    with swelling  . Lumbar stenosis    PAIN BACK AND LEFT LEG AND NUMBNESS LEG AND FOOT  . Peripheral vascular disease (HCC)    HX OF TREATMENT FOR VARICOSE VEINS RIGHT LEG  . Thyroid disease    TOLD BLOOD LEVELS SLIGHTLY ABNORMAL - BUT NOT REQUIRED MEDICATION    Past Surgical History:  Procedure Laterality Date  . ABDOMINAL HYSTERECTOMY    . BACK SURGERY    . bladder lift     AT SAME TIME OF HYSTERECTOMY  . CHOLECYSTECTOMY    . LUMBAR LAMINECTOMY/DECOMPRESSION MICRODISCECTOMY Left 10/24/2014   Procedure: COMPLETE LUMBAR DECOMPRESSION L4-L5 CENTRAL AND MICRODISCECTOMY L4-L5 LEFT;  Surgeon: Jacki Cones, MD;  Location: [facility];  Service: Orthopedics;  Laterality: Left;    There were no vitals filed for this visit.   Subjective Assessment - 01/29/21 1551    Subjective "I am feeling better, the exercises are helping and I took some time off at home."               [facility] PT Assessment - 01/29/21 0001      Assessment   Medical Diagnosis Spondylolisthesis, lumbar region (M43.16), Tendonitis of both shoulders (M77.8)    Referring Provider (PT) [name]                         [facility] Adult PT Treatment/Exercise - 01/29/21 0001      Shoulder Exercises: Standing   External Rotation Right;Strengthening;12 reps;Theraband    Theraband Level (Shoulder External Rotation) Level 2 (Red)    Internal Rotation Strengthening;Right;12 reps;Theraband    Theraband Level (Shoulder Internal Rotation) Level 2 (Red)    Flexion 12 reps   x 2 sets scaption angle with 1#   Extension Strengthening;12 reps;Theraband    Theraband Level (Shoulder Extension) Level 2 (Red)   x 2 sets   Row Strengthening;Both;12 reps;Theraband    Theraband Level (Shoulder Row) Level 2 (Red)      Shoulder Exercises: ROM/Strengthening   UBE (Upper Arm Bike) L2 x 5 min   2:30 fwd/bwd     Manual Therapy   Manual therapy comments skilled palpation and monitoring of pt throughout TPDN  Soft tissue mobilization IASTM along the R upper trap/ levator scapulae      Neck Exercises: Stretches   Upper Trapezius Stretch 1 rep;30 seconds    Levator Stretch Left;1 rep;30 seconds            Trigger Point Dry Needling - 01/29/21 0001    Consent Given? Yes    Muscles Treated Head and Neck Upper trapezius    Upper Trapezius Response Palpable increased muscle length;Twitch reponse elicited   R               PT Education - 01/29/21 1556    Education Details reviewed TPDN and benefits of treatment.    Person(s) Educated Patient    Methods Explanation;Verbal cues    Comprehension Verbalized understanding;Verbal cues required            PT Short Term Goals - 01/07/21 1558      PT SHORT TERM GOAL #1   Title pt to be I with inital HEP    Baseline -    Time 3    Period Weeks    Status New    Target Date 01/28/21      PT SHORT TERM GOAL #2   Title pt to  verbalize/ demo efficient posture and lifting mechanics to reduce and prevent shoulder / back pain    Baseline no previous knowledge of posture.    Time 3    Period Weeks    Status New    Target Date 01/28/21      PT SHORT TERM GOAL #3   Title -      PT SHORT TERM GOAL #4   Title -             PT Long Term Goals - 01/07/21 1559      PT LONG TERM GOAL #1   Title increase R shoulder AROM to >/= 140 degrees for flexion/ abd and maintain functional IR/ ER ROM with </= 3/10 max pain for functiona ROM rquired for ADLs    Time 6    Period Weeks    Status New    Target Date 02/18/21      PT LONG TERM GOAL #2   Title increase bil shoulder strength to >/=4+/5 to promote shoulder stability and assist with lifting mechanics    Time 6    Period Weeks    Status New    Target Date 02/18/21      PT LONG TERM GOAL #3   Title increase FOTO score to >/=66% to demo improvement in function    Time 6    Period Weeks    Status New    Target Date 02/18/21      PT LONG TERM GOAL #4   Title pt to be I with all HEP given as of last visit to maintain and progress current level function    Time 6    Period Weeks    Status New    Target Date 02/18/21      PT LONG TERM GOAL #5   Title -                 Plan - 01/29/21 1629    Clinical Impression Statement pt reports she has been taking some breaks at home with cooking/ cleaning and additionally notes she is feeling better, educated and consent was provided for TPDN focusing on the  Rupper trap followed with IASTM techniques and stretching. continued working on posterior shoulder strength and  R rotator cuff strength. end of session she noted feeling sore but likely from the DN.    PT Treatment/Interventions ADLs/Self Care Home Management;Iontophoresis 4mg /ml Dexamethasone;Moist Heat;Traction;Electrical Stimulation;Therapeutic exercise;Therapeutic activities;Balance training;Gait training;Cryotherapy;Neuromuscular  re-education;Patient/family education;Manual techniques;Passive range of motion;Dry needling;Taping    PT Next Visit Plan revieiw / updated HEP PRN, STW for R upper trap/ levator scapulae, scapualr mobs, discuss DN, response to / IASTM techniques. posture educatoin    PT Home Exercise Plan [place] - chin tuck, upper trap/ levator scapulae stretch, scapular retraction    Consulted and Agree with Plan of Care Patient           Patient will benefit from skilled therapeutic intervention in order to improve the following deficits and impairments:  Improper body mechanics,Increased muscle spasms,Decreased strength,Pain,Postural dysfunction,Decreased activity tolerance,Decreased endurance  Visit Diagnosis: Chronic right shoulder pain  Chronic left shoulder pain  Muscle weakness (generalized)  Chronic right-sided low back pain without sciatica     Problem List Patient Active Problem List   Diagnosis Date Noted  . Body mass index (BMI) of 35.0-35.9 in adult 06/04/2020  . History of migraine headaches 12/06/2019  . Mixed hyperlipidemia 09/04/2019  . Dyslipidemia 06/30/2019  . GERD with esophagitis 06/30/2019  . Prediabetes 10/17/2015  . Hypertriglyceridemia 10/17/2015  . Spinal stenosis, lumbar region, with neurogenic claudication 10/24/2014   10/26/2014 PT, DPT, LAT, ATC  01/29/21  4:32 PM      [facility] Outpatient Rehabilitation [facility] [address] [place], [place], [place] Phone: [phone]   Fax:  [phone]  Name: Kendra Rodriguez MRN: [name] Date of Birth: Aug 22, 1966

## 2021-01-30 NOTE — Telephone Encounter (Signed)
Please Advise

## 2021-01-31 ENCOUNTER — Ambulatory Visit: Payer: 59 | Admitting: Specialist

## 2021-02-02 ENCOUNTER — Encounter: Payer: Self-pay | Admitting: Physical Therapy

## 2021-02-02 ENCOUNTER — Ambulatory Visit: Payer: 59 | Attending: Specialist | Admitting: Physical Therapy

## 2021-02-02 ENCOUNTER — Other Ambulatory Visit: Payer: Self-pay

## 2021-02-02 DIAGNOSIS — M25511 Pain in right shoulder: Secondary | ICD-10-CM | POA: Insufficient documentation

## 2021-02-02 DIAGNOSIS — M545 Low back pain, unspecified: Secondary | ICD-10-CM | POA: Diagnosis present

## 2021-02-02 DIAGNOSIS — M6281 Muscle weakness (generalized): Secondary | ICD-10-CM | POA: Diagnosis present

## 2021-02-02 DIAGNOSIS — M25512 Pain in left shoulder: Secondary | ICD-10-CM | POA: Diagnosis present

## 2021-02-02 DIAGNOSIS — G8929 Other chronic pain: Secondary | ICD-10-CM | POA: Diagnosis present

## 2021-02-02 NOTE — Therapy (Signed)
[facility] Outpatient Rehabilitation [facility] [address] [place], [place], [address] Phone: [phone]   Fax:  [phone]  Physical Therapy Treatment  Patient Details  Name: Kendra Rodriguez MRN: [id number] Date of Birth: April 27, 1966 Referring Provider (PT): [name]   Encounter Date: 02/02/2021   PT End of Session - 02/02/21 0812    Visit Number 6    Number of Visits 13    Date for PT Re-Evaluation 02/18/21    Authorization Type [employer], FOTO at 6th and 10th visit    PT Start Time 0808    PT Stop Time 0849    PT Time Calculation (min) 41 min    Activity Tolerance Patient tolerated treatment well    Behavior During Therapy [facility] for tasks assessed/performed           Past Medical History:  Diagnosis Date  . Acute sinusitis   . Allergy   . Anxiety   . Depression    NO MEDICATIONS - DOING OK NOW  . Dizziness   . Ear ache    RIGHT --HX OF MULTIPLE EAR INFECTIONS AS A CHILD   . GERD (gastroesophageal reflux disease)   . Headache(784.0)    MIGRAINES  . Leg pain, left    with swelling  . Lumbar stenosis    PAIN BACK AND LEFT LEG AND NUMBNESS LEG AND FOOT  . Peripheral vascular disease (HCC)    HX OF TREATMENT FOR VARICOSE VEINS RIGHT LEG  . Thyroid disease    TOLD BLOOD LEVELS SLIGHTLY ABNORMAL - BUT NOT REQUIRED MEDICATION    Past Surgical History:  Procedure Laterality Date  . ABDOMINAL HYSTERECTOMY    . BACK SURGERY    . bladder lift     AT SAME TIME OF HYSTERECTOMY  . CHOLECYSTECTOMY    . LUMBAR LAMINECTOMY/DECOMPRESSION MICRODISCECTOMY Left 10/24/2014   Procedure: COMPLETE LUMBAR DECOMPRESSION L4-L5 CENTRAL AND MICRODISCECTOMY L4-L5 LEFT;  Surgeon: Jacki Cones, MD;  Location: [facility];  Service: Orthopedics;  Laterality: Left;    There were no vitals filed for this visit.   Subjective Assessment - 02/02/21 0814    Subjective "I am feeling better in the R shoulder but still getting intermittent in the r shoulder and the L  shoulder which is in the same place."    Patient Stated Goals to stop the pain,    Currently in Pain? Yes    Pain Score 4     Pain Orientation Right;Left    Pain Descriptors / Indicators Aching    Pain Frequency Intermittent    Aggravating Factors  cleaning              [facility] PT Assessment - 02/02/21 0001      Assessment   Medical Diagnosis Spondylolisthesis, lumbar region (M43.16), Tendonitis of both shoulders (M77.8)    Referring Provider (PT) [name]                         [facility] Adult PT Treatment/Exercise - 02/02/21 0001      Shoulder Exercises: Standing   Protraction Strengthening;20 reps   wall push up   Internal Rotation Strengthening;Theraband;15 reps;Both    Theraband Level (Shoulder Internal Rotation) Level 3 (Green)    Flexion Strengthening;15 reps;Weights    Shoulder Flexion Weight (lbs) 2    Extension 20 reps;Theraband;Both   tactile cues for proper form   Theraband Level (Shoulder Extension) Level 3 (Green)    Row [employer]  reps;Strengthening;Both   tactile cues for proper form   Theraband Level (Shoulder Row) Level 3 (Green)    Other Standing Exercises double ER with scapular retraction bil 2 x 15 with red band    Other Standing Exercises lower trap 2 x 15 with yellow band with elbows propped on the wall      Manual Therapy   Manual therapy comments skilled palpation and monitoring of pt throughout TPDN    Joint Mobilization ac mobs grade III AP bil    Soft tissue mobilization IASTM along the bil upper trap/ levator scapulae            Trigger Point Dry Needling - 02/02/21 0001    Consent Given? Yes    Education Handout Provided Previously provided    Upper Trapezius Response Palpable increased muscle length;Twitch reponse elicited   bil               PT Education - 02/02/21 2248    Education Details reviewed HEP and updated resistance band today.    Person(s) Educated Patient    Methods Explanation;Verbal cues     Comprehension Verbalized understanding;Verbal cues required            PT Short Term Goals - 01/07/21 1558      PT SHORT TERM GOAL #1   Title pt to be I with inital HEP    Baseline -    Time 3    Period Weeks    Status New    Target Date 01/28/21      PT SHORT TERM GOAL #2   Title pt to verbalize/ demo efficient posture and lifting mechanics to reduce and prevent shoulder / back pain    Baseline no previous knowledge of posture.    Time 3    Period Weeks    Status New    Target Date 01/28/21      PT SHORT TERM GOAL #3   Title -      PT SHORT TERM GOAL #4   Title -             PT Long Term Goals - 01/07/21 1559      PT LONG TERM GOAL #1   Title increase R shoulder AROM to >/= 140 degrees for flexion/ abd and maintain functional IR/ ER ROM with </= 3/10 max pain for functiona ROM rquired for ADLs    Time 6    Period Weeks    Status New    Target Date 02/18/21      PT LONG TERM GOAL #2   Title increase bil shoulder strength to >/=4+/5 to promote shoulder stability and assist with lifting mechanics    Time 6    Period Weeks    Status New    Target Date 02/18/21      PT LONG TERM GOAL #3   Title increase FOTO score to >/=66% to demo improvement in function    Time 6    Period Weeks    Status New    Target Date 02/18/21      PT LONG TERM GOAL #4   Title pt to be I with all HEP given as of last visit to maintain and progress current level function    Time 6    Period Weeks    Status New    Target Date 02/18/21      PT LONG TERM GOAL #5   Title -  Plan - 02/02/21 0839    Clinical Impression Statement pt reports she feels she is making progress with physical therapy. continued TPDN focusign on bil upper trap followed with IASTM techinques and AC joint mobs. She did well with strengthening with upgraded resistance today which she did not fatigue more toward the end of exercise.    PT Treatment/Interventions ADLs/Self Care Home  Management;Iontophoresis 4mg /ml Dexamethasone;Moist Heat;Traction;Electrical Stimulation;Therapeutic exercise;Therapeutic activities;Balance training;Gait training;Cryotherapy;Neuromuscular re-education;Patient/family education;Manual techniques;Passive range of motion;Dry needling;Taping    PT Next Visit Plan revieiw / updated HEP PRN, STW for R upper trap/ levator scapulae, scapualr mobs, discuss DN, response to / IASTM techniques. posture educatoin    PT Home Exercise Plan [place] - chin tuck, upper trap/ levator scapulae stretch, scapular retraction    Consulted and Agree with Plan of Care Patient           Patient will benefit from skilled therapeutic intervention in order to improve the following deficits and impairments:  Improper body mechanics,Increased muscle spasms,Decreased strength,Pain,Postural dysfunction,Decreased activity tolerance,Decreased endurance  Visit Diagnosis: Chronic right shoulder pain  Chronic left shoulder pain  Muscle weakness (generalized)  Chronic right-sided low back pain without sciatica     Problem List Patient Active Problem List   Diagnosis Date Noted  . Body mass index (BMI) of 35.0-35.9 in adult 06/04/2020  . History of migraine headaches 12/06/2019  . Mixed hyperlipidemia 09/04/2019  . Dyslipidemia 06/30/2019  . GERD with esophagitis 06/30/2019  . Prediabetes 10/17/2015  . Hypertriglyceridemia 10/17/2015  . Spinal stenosis, lumbar region, with neurogenic claudication 10/24/2014   10/26/2014 PT, DPT, LAT, ATC  02/02/21  8:48 AM      [facility] [address] [place], [place], [place] Phone: [phone]   Fax:  [phone]  Name: ANAIZA BEHRENS MRN: [name] Date of Birth: 1965/12/30

## 2021-02-04 ENCOUNTER — Ambulatory Visit: Payer: 59 | Admitting: Physical Therapy

## 2021-02-04 MED ORDER — ATORVASTATIN CALCIUM 40 MG PO TABS: ORAL_TABLET | ORAL | 0 refills | Status: DC

## 2021-02-06 ENCOUNTER — Other Ambulatory Visit: Payer: Self-pay

## 2021-02-06 ENCOUNTER — Ambulatory Visit: Payer: 59 | Admitting: Physical Therapy

## 2021-02-06 DIAGNOSIS — G8929 Other chronic pain: Secondary | ICD-10-CM

## 2021-02-06 DIAGNOSIS — M6281 Muscle weakness (generalized): Secondary | ICD-10-CM

## 2021-02-06 DIAGNOSIS — M25511 Pain in right shoulder: Secondary | ICD-10-CM | POA: Diagnosis not present

## 2021-02-06 NOTE — Therapy (Signed)
[facility] Outpatient Rehabilitation [facility] [address] [place], [place], [address] Phone: [phone]   Fax:  [phone]  Physical Therapy Treatment  Patient Details  Name: Kendra Rodriguez MRN: [id number] Date of Birth: December 13, 1965 Referring Provider (PT): [name]   Encounter Date: 02/06/2021   PT End of Session - 02/06/21 1626    Visit Number 7    Number of Visits 13    Date for PT Re-Evaluation 02/18/21    Authorization Type [employer], FOTO 10th visit    PT Start Time 1628    PT Stop Time 1712    PT Time Calculation (min) 44 min    Activity Tolerance Patient tolerated treatment well    Behavior During Therapy [facility] for tasks assessed/performed           Past Medical History:  Diagnosis Date  . Acute sinusitis   . Allergy   . Anxiety   . Depression    NO MEDICATIONS - DOING OK NOW  . Dizziness   . Ear ache    RIGHT --HX OF MULTIPLE EAR INFECTIONS AS A CHILD   . GERD (gastroesophageal reflux disease)   . Headache(784.0)    MIGRAINES  . Leg pain, left    with swelling  . Lumbar stenosis    PAIN BACK AND LEFT LEG AND NUMBNESS LEG AND FOOT  . Peripheral vascular disease (HCC)    HX OF TREATMENT FOR VARICOSE VEINS RIGHT LEG  . Thyroid disease    TOLD BLOOD LEVELS SLIGHTLY ABNORMAL - BUT NOT REQUIRED MEDICATION    Past Surgical History:  Procedure Laterality Date  . ABDOMINAL HYSTERECTOMY    . BACK SURGERY    . bladder lift     AT SAME TIME OF HYSTERECTOMY  . CHOLECYSTECTOMY    . LUMBAR LAMINECTOMY/DECOMPRESSION MICRODISCECTOMY Left 10/24/2014   Procedure: COMPLETE LUMBAR DECOMPRESSION L4-L5 CENTRAL AND MICRODISCECTOMY L4-L5 LEFT;  Surgeon: Jacki Cones, MD;  Location: [facility];  Service: Orthopedics;  Laterality: Left;    There were no vitals filed for this visit.   Subjective Assessment - 02/06/21 1628    Subjective "I am feeling better but am not doing as much athome. after last session I felt pretty good."    Currently in  Pain? Yes    Pain Score 4     Pain Location Shoulder    Pain Orientation Right;Left    Pain Descriptors / Indicators Aching              [facility] PT Assessment - 02/06/21 0001      Assessment   Medical Diagnosis Spondylolisthesis, lumbar region (M43.16), Tendonitis of both shoulders (M77.8)    Referring Provider (PT) [name]                         [facility] Adult PT Treatment/Exercise - 02/06/21 0001      Shoulder Exercises: Standing   External Rotation Strengthening;Both;Theraband    Theraband Level (Shoulder External Rotation) Level 4 (Blue)    Internal Rotation Strengthening;Both;10 reps;Theraband    Theraband Level (Shoulder Internal Rotation) Level 4 (Blue)    Extension 20 reps;Theraband    Theraband Level (Shoulder Extension) Level 4 (Blue)    Occupational hygienist;Theraband    Theraband Level (Shoulder Row) Level 4 (Blue)    Other Standing Exercises farmers carry 3 x 100 ft bil UE 10#    Other Standing Exercises lifting and pulling carrying mimicking ADLs 2 x 5 across  the table.      Modalities   Modalities Iontophoresis      Iontophoresis   Type of Iontophoresis Dexamethasone    Location R anterior/ middle deltoid    Dose 4mg /ml    Time 6 hour patch      Manual Therapy   Manual therapy comments skilled palpation and monitoring of pt throughout TPDN    Soft tissue mobilization IASTM along the middle /anterior deltoid            Trigger Point Dry Needling - 02/06/21 0001    Consent Given? Yes    Education Handout Provided Previously provided    Muscles Treated Upper Quadrant Deltoid    Deltoid Response Twitch response elicited;Palpable increased muscle length   ant/ middle deltoid                 PT Short Term Goals - 01/07/21 1558      PT SHORT TERM GOAL #1   Title pt to be I with inital HEP    Baseline -    Time 3    Period Weeks    Status New    Target Date 01/28/21      PT SHORT TERM GOAL #2   Title pt to verbalize/  demo efficient posture and lifting mechanics to reduce and prevent shoulder / back pain    Baseline no previous knowledge of posture.    Time 3    Period Weeks    Status New    Target Date 01/28/21      PT SHORT TERM GOAL #3   Title -      PT SHORT TERM GOAL #4   Title -             PT Long Term Goals - 01/07/21 1559      PT LONG TERM GOAL #1   Title increase R shoulder AROM to >/= 140 degrees for flexion/ abd and maintain functional IR/ ER ROM with </= 3/10 max pain for functiona ROM rquired for ADLs    Time 6    Period Weeks    Status New    Target Date 02/18/21      PT LONG TERM GOAL #2   Title increase bil shoulder strength to >/=4+/5 to promote shoulder stability and assist with lifting mechanics    Time 6    Period Weeks    Status New    Target Date 02/18/21      PT LONG TERM GOAL #3   Title increase FOTO score to >/=66% to demo improvement in function    Time 6    Period Weeks    Status New    Target Date 02/18/21      PT LONG TERM GOAL #4   Title pt to be I with all HEP given as of last visit to maintain and progress current level function    Time 6    Period Weeks    Status New    Target Date 02/18/21      PT LONG TERM GOAL #5   Title -                 Plan - 02/06/21 1715    Clinical Impression Statement Pt reports she hasn't been having as much pain and reports she has been taking more breaks with her household activities. Continued TPDN focusing on the R middle deltoid followed with IASTM techniques. continued working on gross shoulder strengthening, and functional lifting related  to activities of daily lifting. educated about iontophoresis, and applied to the deltoid.    PT Treatment/Interventions ADLs/Self Care Home Management;Iontophoresis 4mg /ml Dexamethasone;Moist Heat;Traction;Electrical Stimulation;Therapeutic exercise;Therapeutic activities;Balance training;Gait training;Cryotherapy;Neuromuscular re-education;Patient/family  education;Manual techniques;Passive range of motion;Dry needling;Taping    PT Next Visit Plan revieiw / updated HEP PRN, STW for R upper trap/ levator scapulae, scapualr mobs, discuss DN, response to / IASTM techniques. posture educatoin, iontophoresis, functional lifting.    PT Home Exercise Plan [place] - chin tuck, upper trap/ levator scapulae stretch, scapular retraction    Consulted and Agree with Plan of Care Patient           Patient will benefit from skilled therapeutic intervention in order to improve the following deficits and impairments:  Improper body mechanics,Increased muscle spasms,Decreased strength,Pain,Postural dysfunction,Decreased activity tolerance,Decreased endurance  Visit Diagnosis: Chronic right shoulder pain  Chronic left shoulder pain  Muscle weakness (generalized)  Chronic right-sided low back pain without sciatica     Problem List Patient Active Problem List   Diagnosis Date Noted  . Body mass index (BMI) of 35.0-35.9 in adult 06/04/2020  . History of migraine headaches 12/06/2019  . Mixed hyperlipidemia 09/04/2019  . Dyslipidemia 06/30/2019  . GERD with esophagitis 06/30/2019  . Prediabetes 10/17/2015  . Hypertriglyceridemia 10/17/2015  . Spinal stenosis, lumbar region, with neurogenic claudication 10/24/2014   10/26/2014 PT, DPT, LAT, ATC  02/06/21  5:31 PM      [facility] [address] [place], [place], [place] Phone: [phone]   Fax:  [phone]  Name: Kendra Rodriguez MRN: [name] Date of Birth: Oct 02, 1966

## 2021-02-07 ENCOUNTER — Ambulatory Visit (INDEPENDENT_AMBULATORY_CARE_PROVIDER_SITE_OTHER): Payer: 59 | Admitting: Emergency Medicine

## 2021-02-07 ENCOUNTER — Encounter: Payer: Self-pay | Admitting: Emergency Medicine

## 2021-02-07 VITALS — BP 116/80 | HR 81 | Temp 98.3°F | Ht 63.0 in | Wt 185.6 lb

## 2021-02-07 DIAGNOSIS — M48062 Spinal stenosis, lumbar region with neurogenic claudication: Secondary | ICD-10-CM | POA: Diagnosis not present

## 2021-02-07 DIAGNOSIS — E785 Hyperlipidemia, unspecified: Secondary | ICD-10-CM | POA: Diagnosis not present

## 2021-02-07 DIAGNOSIS — R5382 Chronic fatigue, unspecified: Secondary | ICD-10-CM | POA: Diagnosis not present

## 2021-02-07 DIAGNOSIS — R5383 Other fatigue: Secondary | ICD-10-CM

## 2021-02-07 DIAGNOSIS — R7303 Prediabetes: Secondary | ICD-10-CM

## 2021-02-07 DIAGNOSIS — R5381 Other malaise: Secondary | ICD-10-CM | POA: Diagnosis not present

## 2021-02-07 LAB — CBC WITH DIFFERENTIAL/PLATELET
Basophils Absolute: 0 10*3/uL (ref 0.0–0.1)
Basophils Relative: 0.4 % (ref 0.0–3.0)
Eosinophils Absolute: 0 10*3/uL (ref 0.0–0.7)
Eosinophils Relative: 0.7 % (ref 0.0–5.0)
HCT: 36.9 % (ref 36.0–46.0)
Hemoglobin: 12.5 g/dL (ref 12.0–15.0)
Lymphocytes Relative: 38.7 % (ref 12.0–46.0)
Lymphs Abs: 2.4 10*3/uL (ref 0.7–4.0)
MCHC: 33.9 g/dL (ref 30.0–36.0)
MCV: 79.7 fl (ref 78.0–100.0)
Monocytes Absolute: 0.6 10*3/uL (ref 0.1–1.0)
Monocytes Relative: 9.1 % (ref 3.0–12.0)
Neutro Abs: 3.2 10*3/uL (ref 1.4–7.7)
Neutrophils Relative %: 51.1 % (ref 43.0–77.0)
Platelets: 285 10*3/uL (ref 150.0–400.0)
RBC: 4.63 Mil/uL (ref 3.87–5.11)
RDW: 11.8 % (ref 11.5–15.5)
WBC: 6.2 10*3/uL (ref 4.0–10.5)

## 2021-02-07 LAB — COMPREHENSIVE METABOLIC PANEL
ALT: 15 U/L (ref 0–35)
AST: 15 U/L (ref 0–37)
Albumin: 4.1 g/dL (ref 3.5–5.2)
Alkaline Phosphatase: 96 U/L (ref 39–117)
BUN: 18 mg/dL (ref 6–23)
CO2: 30 mEq/L (ref 19–32)
Calcium: 9.5 mg/dL (ref 8.4–10.5)
Chloride: 102 mEq/L (ref 96–112)
Creatinine, Ser: 0.6 mg/dL (ref 0.40–1.20)
GFR: 100.96 mL/min (ref >60.00)
Glucose, Bld: 99 mg/dL (ref 70–99)
Potassium: 3.9 mEq/L (ref 3.5–5.1)
Sodium: 138 mEq/L (ref 135–145)
Total Bilirubin: 0.6 mg/dL (ref 0.2–1.2)
Total Protein: 6.9 g/dL (ref 6.0–8.3)

## 2021-02-07 LAB — TSH: TSH: <0.01 u[IU]/mL — ABNORMAL LOW (ref 0.35–4.50)

## 2021-02-07 LAB — VITAMIN D 25 HYDROXY (VIT D DEFICIENCY, FRACTURES): VITD: 24.73 ng/mL — ABNORMAL LOW (ref 30.00–100.00)

## 2021-02-07 LAB — HEMOGLOBIN A1C: Hgb A1c MFr Bld: 6 % (ref 4.6–6.5)

## 2021-02-07 MED ORDER — ATORVASTATIN CALCIUM 40 MG PO TABS: ORAL_TABLET | ORAL | 3 refills | Status: DC

## 2021-02-07 NOTE — Patient Instructions (Signed)
Mantenimiento de la salud en las mujeres Health Maintenance, Female Adoptar un estilo de vida saludable y recibir atencin preventiva son importantes para promover la salud y el bienestar. Consulte al mdico sobre:  El esquema adecuado para hacerse pruebas y exmenes peridicos.  Cosas que puede hacer por su cuenta para prevenir enfermedades y mantenerse sana. Qu debo saber sobre la dieta, el peso y el ejercicio? Consuma una dieta saludable  Consuma una dieta que incluya muchas verduras, frutas, productos lcteos con bajo contenido de grasa y protenas magras.  No consuma muchos alimentos ricos en grasas slidas, azcares agregados o sodio.   Mantenga un peso saludable El ndice de masa muscular (IMC) se utiliza para identificar problemas de peso. Proporciona una estimacin de la grasa corporal basndose en el peso y la altura. Su mdico puede ayudarle a determinar su IMC y a lograr o mantener un peso saludable. Haga ejercicio con regularidad Haga ejercicio con regularidad. Esta es una de las prcticas ms importantes que puede hacer por su salud. La mayora de los adultos deben seguir estas pautas:  Realizar, al menos, 150minutos de actividad fsica por semana. El ejercicio debe aumentar la frecuencia cardaca y hacerlo transpirar (ejercicio de intensidad moderada).  Hacer ejercicios de fortalecimiento por lo menos dos veces por semana. Agregue esto a su plan de ejercicio de intensidad moderada.  Pasar menos tiempo sentados. Incluso la actividad fsica ligera puede ser beneficiosa. Controle sus niveles de colesterol y lpidos en la sangre Comience a realizarse anlisis de lpidos y colesterol en la sangre a los 20aos y luego reptalos cada 5aos. Hgase controlar los niveles de colesterol con mayor frecuencia si:  Sus niveles de lpidos y colesterol son altos.  Es mayor de 40aos.  Presenta un alto riesgo de padecer enfermedades cardacas. Qu debo saber sobre las pruebas de  deteccin del cncer? Segn su historia clnica y sus antecedentes familiares, es posible que deba realizarse pruebas de deteccin del cncer en diferentes edades. Esto puede incluir pruebas de deteccin de lo siguiente:  Cncer de mama.  Cncer de cuello uterino.  Cncer colorrectal.  Cncer de piel.  Cncer de pulmn. Qu debo saber sobre la enfermedad cardaca, la diabetes y la hipertensin arterial? Presin arterial y enfermedad cardaca  La hipertensin arterial causa enfermedades cardacas y aumenta el riesgo de accidente cerebrovascular. Es ms probable que esto se manifieste en las personas que tienen lecturas de presin arterial alta, tienen ascendencia africana o tienen sobrepeso.  Hgase controlar la presin arterial: ? Cada 3 a 5 aos si tiene entre 18 y 39 aos. ? Todos los aos si es mayor de 40aos. Diabetes Realcese exmenes de deteccin de la diabetes con regularidad. Este anlisis revisa el nivel de azcar en la sangre en ayunas. Hgase las pruebas de deteccin:  Cada tresaos despus de los 40aos de edad si tiene un peso normal y un bajo riesgo de padecer diabetes.  Con ms frecuencia y a partir de una edad inferior si tiene sobrepeso o un alto riesgo de padecer diabetes. Qu debo saber sobre la prevencin de infecciones? Hepatitis B Si tiene un riesgo ms alto de contraer hepatitis B, debe someterse a un examen de deteccin de este virus. Hable con el mdico para averiguar si tiene riesgo de contraer la infeccin por hepatitis B. Hepatitis C Se recomienda el anlisis a:  Todos los que nacieron entre 1945 y 1965.  Todas las personas que tengan un riesgo de haber contrado hepatitis C. Enfermedades de transmisin sexual (ETS)  Hgase   las pruebas de deteccin de ITS, incluidas la gonorrea y la clamidia, si: ? Es sexualmente activa y es menor de 24aos. ? Es mayor de 24aos, y el mdico le informa que corre riesgo de tener este tipo de  infecciones. ? La actividad sexual ha cambiado desde que le hicieron la ltima prueba de deteccin y tiene un riesgo mayor de tener clamidia o gonorrea. Pregntele al mdico si usted tiene riesgo.  Pregntele al mdico si usted tiene un alto riesgo de contraer VIH. El mdico tambin puede recomendarle un medicamento recetado para ayudar a evitar la infeccin por el VIH. Si elige tomar medicamentos para prevenir el VIH, primero debe hacerse los anlisis de deteccin del VIH. Luego debe hacerse anlisis cada 3meses mientras est tomando los medicamentos. Embarazo  Si est por dejar de menstruar (fase premenopusica) y usted puede quedar embarazada, busque asesoramiento antes de quedar embarazada.  Tome de 400 a 800microgramos (mcg) de cido flico todos los das si queda embarazada.  Pida mtodos de control de la natalidad (anticonceptivos) si desea evitar un embarazo no deseado. Osteoporosis y menopausia La osteoporosis es una enfermedad en la que los huesos pierden los minerales y la fuerza por el avance de la edad. El resultado pueden ser fracturas en los huesos. Si tiene 65aos o ms, o si est en riesgo de sufrir osteoporosis y fracturas, pregunte a su mdico si debe:  Hacerse pruebas de deteccin de prdida sea.  Tomar un suplemento de calcio o de vitamina D para reducir el riesgo de fracturas.  Recibir terapia de reemplazo hormonal (TRH) para tratar los sntomas de la menopausia. Siga estas instrucciones en su casa: Estilo de vida  No consuma ningn producto que contenga nicotina o tabaco, como cigarrillos, cigarrillos electrnicos y tabaco de mascar. Si necesita ayuda para dejar de fumar, consulte al mdico.  No consuma drogas.  No comparta agujas.  Solicite ayuda a su mdico si necesita apoyo o informacin para abandonar las drogas. Consumo de alcohol  No beba alcohol si: ? Su mdico le indica no hacerlo. ? Est embarazada, puede estar embarazada o est tratando de quedar  embarazada.  Si bebe alcohol: ? Limite la cantidad que consume de 0 a 1 medida por da. ? Limite la ingesta si est amamantando.  Est atento a la cantidad de alcohol que hay en las bebidas que toma. En los [place], una medida equivale a una botella de cerveza de 12oz (355ml), un vaso de vino de 5oz (148ml) o un vaso de una bebida alcohlica de alta graduacin de 1oz (44ml). Instrucciones generales  Realcese los estudios de rutina de la salud, dentales y de la vista.  Mantngase al da con las vacunas.  Infrmele a su mdico si: ? Se siente deprimida con frecuencia. ? Alguna vez ha sido vctima de maltrato o no se siente segura en su casa. Resumen  Adoptar un estilo de vida saludable y recibir atencin preventiva son importantes para promover la salud y el bienestar.  Siga las instrucciones del mdico acerca de una dieta saludable, el ejercicio y la realizacin de pruebas o exmenes para detectar enfermedades.  Siga las instrucciones del mdico con respecto al control del colesterol y la presin arterial. Esta informacin no tiene como fin reemplazar el consejo del mdico. Asegrese de hacerle al mdico cualquier pregunta que tenga. Document Revised: 11/10/2018 Document Reviewed: 11/10/2018 Elsevier Patient Education  2021 Elsevier Inc.  

## 2021-02-07 NOTE — Progress Notes (Signed)
Kendra Rodriguez 55 y.o.   Chief Complaint  Patient presents with  . Follow-up    HISTORY OF PRESENT ILLNESS: This is a 55 y.o. female here for follow-up of chronic medical problems.  Has history of prediabetes and dyslipidemia. States that she recently saw her GI doctor and blood work showed a thyroid problem as well as liver concerns.  Blood work results not available for my review.  They were done about 1 month ago.  Blood work done by me last August was within normal limits and it included a TSH and CMP.  Patient complaining of feeling tired and fatigued that started 3 to 4 months ago.  Of history of seasonal allergies.  Denies any other associated symptoms. Has no other complaints or medical concerns today.  HPI   Prior to Admission medications   Medication Sig Start Date End Date Taking? Authorizing Provider  atorvastatin (LIPITOR) 40 MG tablet TAKE 1 TABLET(40 MG) BY MOUTH DAILY 02/04/21  Yes Artice Holohan, Eilleen Kempf, MD  cholecalciferol (VITAMIN D3) 25 MCG (1000 UNIT) tablet Take 1,000 Units by mouth daily.   Yes [provider]  diclofenac Sodium (QC DICLOFENAC SODIUM) 1 % GEL Apply 4 g topically 4 (four) times daily. 01/02/21  Yes Kerrin Champagne, MD  famotidine (PEPCID) 40 MG tablet Take 40 mg by mouth daily.   Yes [provider]  gabapentin (NEURONTIN) 100 MG capsule Take 1 capsule (100 mg total) by mouth at bedtime. 01/02/21  Yes Kerrin Champagne, MD  metFORMIN (GLUCOPHAGE) 850 MG tablet TAKE 1 TABLET(850 MG) BY MOUTH TWICE DAILY WITH A MEAL 01/28/21  Yes Errica Dutil, Eilleen Kempf, MD  pantoprazole (PROTONIX) 40 MG tablet Take 40 mg by mouth daily.   Yes [provider]  sucralfate (CARAFATE) 1 GM/10ML suspension  01/06/20  Yes [provider]  XIIDRA 5 % SOLN Place 1 drop into both eyes 2 (two) times daily. 03/23/20  Yes [provider]  azithromycin (ZITHROMAX) 250 MG tablet Sig as indicated Patient not taking: Reported on 02/07/2021 06/26/20    [name], MD  benzonatate (TESSALON) 100 MG capsule Take 1-2 capsules (100-200 mg total) by mouth 3 (three) times daily as needed. Patient not taking: Reported on 02/07/2021 06/30/20   [name], PA-C  DEXILANT 60 MG capsule TAKE 1 CAPSULE(60 MG) BY MOUTH EVERY MORNING Patient not taking: Reported on 02/07/2021 10/05/19   [name], MD  promethazine-dextromethorphan (PROMETHAZINE-DM) 6.25-15 MG/5ML syrup Take 5 mLs by mouth at bedtime as needed for cough. Patient not taking: Reported on 02/07/2021 06/30/20   [name], PA-C  pseudoephedrine (SUDAFED) 60 MG tablet Take 1 tablet (60 mg total) by mouth every 6 (six) hours as needed for congestion. Patient not taking: Reported on 02/07/2021 06/30/20   [name], PA-C  SUMAtriptan (IMITREX) 50 MG tablet Take 50 mg at the onset of headache; may repeat every 2 hours but no more than 200 mg/24 hours. Patient not taking: Reported on 02/07/2021 06/04/20   [name], MD  triamcinolone (NASACORT) 55 MCG/ACT AERO nasal inhaler Place 2 sprays into the nose daily. Patient not taking: Reported on 02/07/2021 03/02/20   Lezlie Lye, Meda Coffee, MD    No Known Allergies  Patient Active Problem List   Diagnosis Date Noted  . Body mass index (BMI) of 35.0-35.9 in adult 06/04/2020  . History of migraine headaches 12/06/2019  . Mixed hyperlipidemia 09/04/2019  . Dyslipidemia 06/30/2019  . GERD with esophagitis 06/30/2019  . Prediabetes 10/17/2015  .  Hypertriglyceridemia 10/17/2015  . Spinal stenosis, lumbar region, with neurogenic claudication 10/24/2014    Past Medical History:  Diagnosis Date  . Acute sinusitis   . Allergy   . Anxiety   . Depression    NO MEDICATIONS - DOING OK NOW  . Dizziness   . Ear ache    RIGHT --HX OF MULTIPLE EAR INFECTIONS AS A CHILD   . GERD (gastroesophageal reflux disease)   . Headache(784.0)    MIGRAINES  . Leg pain, left    with swelling  . Lumbar stenosis    PAIN BACK AND LEFT LEG AND  NUMBNESS LEG AND FOOT  . Peripheral vascular disease (HCC)    HX OF TREATMENT FOR VARICOSE VEINS RIGHT LEG  . Thyroid disease    TOLD BLOOD LEVELS SLIGHTLY ABNORMAL - BUT NOT REQUIRED MEDICATION    Past Surgical History:  Procedure Laterality Date  . ABDOMINAL HYSTERECTOMY    . BACK SURGERY    . bladder lift     AT SAME TIME OF HYSTERECTOMY  . CHOLECYSTECTOMY    . LUMBAR LAMINECTOMY/DECOMPRESSION MICRODISCECTOMY Left 10/24/2014   Procedure: COMPLETE LUMBAR DECOMPRESSION L4-L5 CENTRAL AND MICRODISCECTOMY L4-L5 LEFT;  Surgeon: Jacki Cones, MD;  Location: [facility];  Service: Orthopedics;  Laterality: Left;    Social History   Socioeconomic History  . Marital status: Married    Spouse name: Not on file  . Number of children: Not on file  . Years of education: Not on file  . Highest education level: Not on file  Occupational History  . Not on file  Tobacco Use  . Smoking status: Never Smoker  . Smokeless tobacco: Never Used  Vaping Use  . Vaping Use: Never used  Substance and Sexual Activity  . Alcohol use: No  . Drug use: No  . Sexual activity: Not on file  Other Topics Concern  . Not on file  Social History Narrative  . Not on file   Social Determinants of Health   Financial Resource Strain: Not on file  Food Insecurity: Not on file  Transportation Needs: Not on file  Physical Activity: Not on file  Stress: Not on file  Social Connections: Not on file  Intimate Partner Violence: Not on file    Family History  Problem Relation Age of Onset  . Hypertension Mother   . Arthritis Mother   . Diabetes Mother   . Hyperlipidemia Mother   . Arthritis Sister   . Rheum arthritis Brother   . Autoimmune disease Brother   . Lung disease Brother   . Arthritis Sister   . Gout Son   . High Cholesterol Daughter      Review of Systems  Constitutional: Positive for malaise/fatigue. Negative for chills and fever.  HENT: Negative.  Negative for congestion and sore  throat.   Respiratory: Negative.  Negative for cough and shortness of breath.   Cardiovascular: Negative.  Negative for chest pain and palpitations.  Gastrointestinal: Negative.  Negative for abdominal pain, diarrhea, nausea and vomiting.  Genitourinary: Negative.  Negative for dysuria and hematuria.  Musculoskeletal: Negative.  Negative for back pain, myalgias and neck pain.  Skin: Negative.  Negative for rash.  Neurological: Negative.  Negative for dizziness and headaches.  Endo/Heme/Allergies: Positive for environmental allergies.  All other systems reviewed and are negative.    Today's Vitals   02/07/21 1451  BP: 116/80  Pulse: 81  Temp: 98.3 F (36.8 C)  TempSrc: Oral  SpO2: 98%  Weight: 185  lb 9.6 oz (84.2 kg)  Height:  (1.6 m)   Body mass index is 32.88 kg/m.   Physical Exam Vitals reviewed.  Constitutional:      Appearance: Normal appearance.  HENT:     Head: Normocephalic.  Eyes:     Extraocular Movements: Extraocular movements intact.     Conjunctiva/sclera: Conjunctivae normal.     Pupils: Pupils are equal, round, and reactive to light.  Cardiovascular:     Rate and Rhythm: Normal rate and regular rhythm.     Pulses: Normal pulses.     Heart sounds: Normal heart sounds.  Pulmonary:     Effort: Pulmonary effort is normal.     Breath sounds: Normal breath sounds.  Abdominal:     General: Bowel sounds are normal. There is no distension.     Palpations: Abdomen is soft. There is no mass.     Tenderness: There is no abdominal tenderness.  Musculoskeletal:        General: Normal range of motion.     Cervical back: Normal range of motion.  Skin:    General: Skin is warm and dry.     Capillary Refill: Capillary refill takes less than 2 seconds.  Neurological:     General: No focal deficit present.     Mental Status: She is alert and oriented to person, place, and time.  Psychiatric:        Mood and Affect: Mood normal.        Behavior: Behavior  normal.     A total of 30 minutes was spent with the patient, greater than 50% of which was in counseling/coordination of care regarding differential diagnosis of chronic fatigue and malaise, need for diagnostic work-up and repeat blood work, education on nutrition, review of most recent office visit notes, review of most recent blood work results, prognosis, documentation, and need for follow-up.   ASSESSMENT & PLAN: Vallorie was seen today for follow-up and medication refill.  Diagnoses and all orders for this visit:  Chronic fatigue and malaise -     CBC with Differential/Platelet -     Comprehensive metabolic panel -     TSH -     VITAMIN D 25 Hydroxy (Vit-D Deficiency, Fractures)  Dyslipidemia -     atorvastatin (LIPITOR) 40 MG tablet; TAKE 1 TABLET(40 MG) BY MOUTH DAILY  Prediabetes -     Hemoglobin A1c  Spinal stenosis, lumbar region, with neurogenic claudication  Tiredness    Patient Instructions   Mantenimiento de la salud en las mujeres Health Maintenance, Female Adoptar un estilo de vida saludable y recibir atencin preventiva son importantes para promover la salud y Counsellor. Consulte al mdico sobre:  El esquema adecuado para hacerse pruebas y exmenes peridicos.  Cosas que puede hacer por su cuenta para prevenir enfermedades y [employer]. Qu debo saber sobre la dieta, el peso y el ejercicio? Consuma una dieta saludable  Consuma una dieta que incluya muchas verduras, frutas, productos lcteos con bajo contenido de [place] y Associate Professor.  No consuma muchos alimentos ricos en grasas slidas, azcares agregados o sodio.   Mantenga un peso saludable El ndice de masa muscular [facility]) se [place] para identificar problemas de [place]. Proporciona una estimacin de la grasa corporal basndose en el peso y la altura. Su mdico puede ayudarle a Engineer, site IMC y a Personnel officer o Pharmacologist un peso saludable. Haga ejercicio con regularidad Haga ejercicio con  regularidad. Esta es una de las prcticas ms importantes  que puede hacer por su salud. La mayora de los adultos deben seguir estas pautas:  Education officer, environmental, al menos, de actividad fsica por semana. El ejercicio debe aumentar la frecuencia cardaca y Media planner transpirar (ejercicio de intensidad moderada).  Hacer ejercicios de fortalecimiento por lo [employer] por semana. Agregue esto a su plan de ejercicio de intensidad moderada.  Pasar menos tiempo sentados. Incluso la actividad fsica ligera puede ser beneficiosa. Controle sus niveles de colesterol y lpidos en la sangre Comience a realizarse anlisis de lpidos y Oncologist en la sangre a los 20aos y luego reptalos cada 5aos. Hgase controlar los niveles de colesterol con mayor frecuencia si:  Sus niveles de lpidos y colesterol son altos.  Es mayor de 40aos.  Presenta un alto riesgo de padecer enfermedades cardacas. Qu debo saber sobre las pruebas de deteccin del cncer? Segn su historia clnica y sus antecedentes familiares, es posible que deba realizarse pruebas de deteccin del cncer en diferentes edades. Esto puede incluir pruebas de deteccin de lo siguiente:  Cncer de mama.  Cncer de cuello uterino.  Cncer colorrectal.  Cncer de piel.  Cncer de pulmn. Qu debo saber sobre la enfermedad cardaca, la diabetes y la hipertensin arterial? Presin arterial y enfermedad cardaca  La hipertensin arterial causa enfermedades cardacas y [place] el riesgo de accidente cerebrovascular. Es ms probable que esto se manifieste en las personas que tienen lecturas de presin arterial alta, tienen ascendencia africana o tienen sobrepeso.  Hgase controlar la presin arterial: ? Cada 3 a 5 aos si tiene entre 18 y 10 aos. ? Todos los aos si es mayor de [place]. Diabetes Realcese exmenes de deteccin de la diabetes con regularidad. Este anlisis revisa el nivel de azcar en la sangre en [place]. Hgase  las pruebas de deteccin:  Cada tresaos despus de los 40aos de edad si tiene un peso normal y un bajo riesgo de padecer diabetes.  Con ms frecuencia y a partir de [place] edad inferior si tiene sobrepeso o un alto riesgo de padecer diabetes. Qu debo saber sobre la prevencin de infecciones? Hepatitis B Si tiene un riesgo ms alto de contraer hepatitis B, debe someterse a un examen de deteccin de este virus. Hable con el mdico para averiguar si tiene riesgo de contraer la infeccin por hepatitis B. Hepatitis C Se recomienda el anlisis a:  [employer] 1945 y 1965.  Todas las personas que tengan un riesgo de haber contrado hepatitis C. Enfermedades de transmisin sexual (ETS)  Hgase las pruebas de Airline pilot de ITS, incluidas la gonorrea y la clamidia, si: ? Es sexualmente activa y es menor de [place]. ? Es mayor de 24aos, y Public affairs consultant informa que corre riesgo de tener este tipo de infecciones. ? La actividad sexual ha cambiado desde que le hicieron la ltima prueba de deteccin y tiene un riesgo mayor de Warehouse manager clamidia o Copy. Pregntele al mdico si usted tiene riesgo.  Pregntele al mdico si usted tiene un alto riesgo de Primary school teacher VIH. El mdico tambin puede recomendarle un medicamento recetado para ayudar a evitar la infeccin por el VIH. Si elige tomar medicamentos para prevenir el VIH, primero debe [employer] de deteccin del VIH. Luego debe hacerse anlisis cada mientras est tomando los medicamentos. Embarazo  Si est por dejar de Armed forces training and education officer (fase premenopusica) y usted puede quedar [place], busque asesoramiento antes de [place].  Tome de 400 a (mcg) de cido [employer] si [place].  Pida mtodos de control de la natalidad (anticonceptivos) si desea evitar un embarazo no deseado. Osteoporosis y [place] La osteoporosis es una enfermedad en la que los huesos pierden los minerales y  la fuerza por el avance de la edad. El resultado pueden ser fracturas en los [place]. Si tiene 65aos o ms, o si est en riesgo de sufrir osteoporosis y fracturas, pregunte a su mdico si debe:  Hacerse pruebas de deteccin de prdida sea.  Tomar un suplemento de calcio o de vitamina D para reducir el riesgo de fracturas.  Recibir terapia de reemplazo hormonal (TRH) para tratar los sntomas de la menopausia. Siga estas instrucciones en su casa: Estilo de vida  No consuma ningn producto que contenga nicotina o tabaco, como cigarrillos, cigarrillos electrnicos y tabaco de Theatre managermascar. Si necesita ayuda para dejar de fumar, consulte al mdico.  No consuma drogas.  No comparta agujas.  Solicite ayuda a su mdico si necesita apoyo o informacin para abandonar las drogas. Consumo de alcohol  No beba alcohol si: ? Su mdico le indica no hacerlo. ? Est embarazada, puede estar embarazada o est tratando de quedar embarazada.  Si bebe alcohol: ? Limite la cantidad que consume de 0 a 1 medida por da. ? Limite la ingesta si est amamantando.  Est atento a la cantidad de alcohol que hay en las bebidas que toma. En los [place], una medida equivale a una botella de cerveza de 12oz (355ml), un vaso de vino de 5oz (148ml) o un vaso de una bebida alcohlica de alta graduacin de 1oz (44ml). Instrucciones generales  Realcese los estudios de rutina de la salud, dentales y de Wellsite geologistla vista.  Mantngase al da con las vacunas.  Infrmele a su mdico si: ? Se siente deprimida con frecuencia. ? Alguna vez ha sido vctima de Bloomermaltrato o no se siente segura en su casa. Resumen  Adoptar un estilo de vida saludable y recibir atencin preventiva son importantes para promover la salud y Counsellorel bienestar.  Siga las instrucciones del mdico acerca de una dieta saludable, el ejercicio y la realizacin de pruebas o exmenes para Hotel managerdetectar enfermedades.  Siga las instrucciones del mdico con respecto  al control del colesterol y la presin arterial. Esta informacin no tiene Theme park managercomo fin reemplazar el consejo del mdico. Asegrese de hacerle al mdico cualquier pregunta que tenga. Document Revised: 11/10/2018 Document Reviewed: 11/10/2018 Elsevier Patient Education  2021 Elsevier Inc.      Edwina BarthMiguel Christiane Sistare, MD [facility]

## 2021-02-09 ENCOUNTER — Telehealth: Payer: Self-pay | Admitting: Emergency Medicine

## 2021-02-09 ENCOUNTER — Other Ambulatory Visit: Payer: Self-pay

## 2021-02-09 ENCOUNTER — Other Ambulatory Visit: Payer: Self-pay | Admitting: Emergency Medicine

## 2021-02-09 ENCOUNTER — Ambulatory Visit: Payer: 59 | Admitting: Physical Therapy

## 2021-02-09 ENCOUNTER — Encounter: Payer: Self-pay | Admitting: Emergency Medicine

## 2021-02-09 ENCOUNTER — Encounter: Payer: Self-pay | Admitting: Physical Therapy

## 2021-02-09 DIAGNOSIS — M25512 Pain in left shoulder: Secondary | ICD-10-CM

## 2021-02-09 DIAGNOSIS — M6281 Muscle weakness (generalized): Secondary | ICD-10-CM

## 2021-02-09 DIAGNOSIS — G8929 Other chronic pain: Secondary | ICD-10-CM

## 2021-02-09 DIAGNOSIS — E079 Disorder of thyroid, unspecified: Secondary | ICD-10-CM

## 2021-02-09 DIAGNOSIS — R7989 Other specified abnormal findings of blood chemistry: Secondary | ICD-10-CM

## 2021-02-09 DIAGNOSIS — M545 Low back pain, unspecified: Secondary | ICD-10-CM

## 2021-02-09 DIAGNOSIS — M25511 Pain in right shoulder: Secondary | ICD-10-CM | POA: Diagnosis not present

## 2021-02-09 NOTE — Therapy (Signed)
[facility] Outpatient Rehabilitation [facility] [address] [place], [place], [address] Phone: [phone]   Fax:  [phone]  Physical Therapy Treatment  Patient Details  Name: Kendra Rodriguez MRN: [id number] Date of Birth: 1966-02-15 Referring Provider (PT): [name]   Encounter Date: 02/09/2021   PT End of Session - 02/09/21 0817    Visit Number 8    Number of Visits 13    Date for PT Re-Evaluation 02/18/21    Authorization Type [employer], FOTO 10th visit    PT Start Time 0815    PT Stop Time 0857    PT Time Calculation (min) 42 min    Activity Tolerance Patient tolerated treatment well    Behavior During Therapy [facility] for tasks assessed/performed           Past Medical History:  Diagnosis Date  . Acute sinusitis   . Allergy   . Anxiety   . Depression    NO MEDICATIONS - DOING OK NOW  . Dizziness   . Ear ache    RIGHT --HX OF MULTIPLE EAR INFECTIONS AS A CHILD   . GERD (gastroesophageal reflux disease)   . Headache(784.0)    MIGRAINES  . Leg pain, left    with swelling  . Lumbar stenosis    PAIN BACK AND LEFT LEG AND NUMBNESS LEG AND FOOT  . Peripheral vascular disease (HCC)    HX OF TREATMENT FOR VARICOSE VEINS RIGHT LEG  . Thyroid disease    TOLD BLOOD LEVELS SLIGHTLY ABNORMAL - BUT NOT REQUIRED MEDICATION    Past Surgical History:  Procedure Laterality Date  . ABDOMINAL HYSTERECTOMY    . BACK SURGERY    . bladder lift     AT SAME TIME OF HYSTERECTOMY  . CHOLECYSTECTOMY    . LUMBAR LAMINECTOMY/DECOMPRESSION MICRODISCECTOMY Left 10/24/2014   Procedure: COMPLETE LUMBAR DECOMPRESSION L4-L5 CENTRAL AND MICRODISCECTOMY L4-L5 LEFT;  Surgeon: Jacki Cones, MD;  Location: [facility];  Service: Orthopedics;  Laterality: Left;    There were no vitals filed for this visit.   Subjective Assessment - 02/09/21 0823    Subjective " am having more soreness inthe R shoulder where the needles were, the top part of my shoulder is doing pretty  good."    Currently in Pain? Yes    Pain Score 6     Pain Location Shoulder    Pain Orientation Right    Pain Descriptors / Indicators Aching    Pain Type Chronic pain    Pain Onset More than a month ago    Pain Frequency Intermittent    Aggravating Factors  RUE use              [facility] PT Assessment - 02/09/21 0001      Assessment   Medical Diagnosis Spondylolisthesis, lumbar region (M43.16), Tendonitis of both shoulders (M77.8)    Referring Provider (PT) [name]                         [facility] Adult PT Treatment/Exercise - 02/09/21 0001      Shoulder Exercises: Supine   Other Supine Exercises bicep curl focus on eccentric loading with re d theraband 2 x 10      Shoulder Exercises: Seated   Other Seated Exercises double ER with scapular retraction 2 x 10 with green band      Shoulder Exercises: Stretch   Other Shoulder Stretches posterior capsule stretch 2 x 30  upper trap stretch   Other Shoulder Stretches bicep stretch 2 x 30 sec in supine      Ultrasound   Ultrasound Location R deldoid and long head of bicep tendon    Ultrasound Parameters 1.2 w/cm2 @ 100% x 8 min    Ultrasound Goals Pain      Iontophoresis   Type of Iontophoresis Dexamethasone    Location R anterior/ middle deltoid    Dose 4mg /ml    Time 6 hour patch      Manual Therapy   Manual therapy comments MTPR along the R deltoid and bicep    Soft tissue mobilization IASTM along the middle /anterior deltoid and proximal long head of bicep tendon                  PT Education - 02/09/21 0855    Education Details reviewed how to perform self eccentric bicep curl with weight  at home 1 set 10-15 reps    Person(s) Educated Patient    Methods Explanation;Verbal cues;Demonstration    Comprehension Verbalized understanding;Verbal cues required;Returned demonstration            PT Short Term Goals - 01/07/21 1558      PT SHORT TERM GOAL #1   Title pt to be I with  inital HEP    Baseline -    Time 3    Period Weeks    Status New    Target Date 01/28/21      PT SHORT TERM GOAL #2   Title pt to verbalize/ demo efficient posture and lifting mechanics to reduce and prevent shoulder / back pain    Baseline no previous knowledge of posture.    Time 3    Period Weeks    Status New    Target Date 01/28/21      PT SHORT TERM GOAL #3   Title -      PT SHORT TERM GOAL #4   Title -             PT Long Term Goals - 01/07/21 1559      PT LONG TERM GOAL #1   Title increase R shoulder AROM to >/= 140 degrees for flexion/ abd and maintain functional IR/ ER ROM with </= 3/10 max pain for functiona ROM rquired for ADLs    Time 6    Period Weeks    Status New    Target Date 02/18/21      PT LONG TERM GOAL #2   Title increase bil shoulder strength to >/=4+/5 to promote shoulder stability and assist with lifting mechanics    Time 6    Period Weeks    Status New    Target Date 02/18/21      PT LONG TERM GOAL #3   Title increase FOTO score to >/=66% to demo improvement in function    Time 6    Period Weeks    Status New    Target Date 02/18/21      PT LONG TERM GOAL #4   Title pt to be I with all HEP given as of last visit to maintain and progress current level function    Time 6    Period Weeks    Status New    Target Date 02/18/21      PT LONG TERM GOAL #5   Title -                 Plan -  02/09/21 0856    Clinical Impression Statement pt arrives to session noted more soreness located along the middle deltoid and long head of bicpe tendon. continued working on muscle tension via MTPR followd with [place] and IASTM techniques which she responded very well to. focused strengthenign on eccentric bicep strengthening to promote tendon healing. continued application of iontophoresis end of session to reduce inflammation. end of sessoin she noted pain dropped to a 4/10.    PT Next Visit Plan revieiw / updated HEP PRN, STW for R upper trap/  levator scapulae, scapualr mobs, discuss DN, response to [place] / IASTM techniques. posture educatoin, iontophoresis, functional lifting. eccentrics for biceps    Consulted and Agree with Plan of Care Patient           Patient will benefit from skilled therapeutic intervention in order to improve the following deficits and impairments:     Visit Diagnosis: Chronic right shoulder pain  Chronic left shoulder pain  Muscle weakness (generalized)  Chronic right-sided low back pain without sciatica     Problem List Patient Active Problem List   Diagnosis Date Noted  . Body mass index (BMI) of 35.0-35.9 in adult 06/04/2020  . History of migraine headaches 12/06/2019  . Dyslipidemia 06/30/2019  . GERD with esophagitis 06/30/2019  . Prediabetes 10/17/2015  . Spinal stenosis, lumbar region, with neurogenic claudication 10/24/2014    Lulu Riding PT, DPT, LAT, ATC  02/09/21  8:59 AM      [facility] [address] [place], [place], [address] Phone: [phone]   Fax:  [phone]  Name: CALLAHAN PEDDIE MRN: [id number] Date of Birth: 1966/05/08

## 2021-02-09 NOTE — Telephone Encounter (Signed)
Called to discuss blood results and need for endocrinology follow-up.  No answer.  Patient has a schedule appointment to see Dr. Everardo All next May.

## 2021-02-11 ENCOUNTER — Ambulatory Visit: Payer: 59 | Admitting: Physical Therapy

## 2021-02-11 ENCOUNTER — Other Ambulatory Visit: Payer: Self-pay

## 2021-02-11 DIAGNOSIS — M545 Low back pain, unspecified: Secondary | ICD-10-CM

## 2021-02-11 DIAGNOSIS — G8929 Other chronic pain: Secondary | ICD-10-CM

## 2021-02-11 DIAGNOSIS — M25511 Pain in right shoulder: Secondary | ICD-10-CM | POA: Diagnosis not present

## 2021-02-11 DIAGNOSIS — M6281 Muscle weakness (generalized): Secondary | ICD-10-CM

## 2021-02-11 NOTE — Therapy (Signed)
[facility] Outpatient Rehabilitation [facility] [address] [place], [place], [address] Phone: [phone]   Fax:  [phone]  Physical Therapy Treatment  Patient Details  Name: Kendra Rodriguez MRN: [id number] Date of Birth: 1966-05-28 Referring Provider (PT): [name]   Encounter Date: 02/11/2021   PT End of Session - 02/11/21 1618    Visit Number 9    Number of Visits 13    Date for PT Re-Evaluation 02/18/21    Authorization Type [employer], FOTO 10th visit    PT Start Time 1618    PT Stop Time 1702    PT Time Calculation (min) 44 min    Activity Tolerance Patient tolerated treatment well    Behavior During Therapy [facility] for tasks assessed/performed           Past Medical History:  Diagnosis Date  . Acute sinusitis   . Allergy   . Anxiety   . Depression    NO MEDICATIONS - DOING OK NOW  . Dizziness   . Ear ache    RIGHT --HX OF MULTIPLE EAR INFECTIONS AS A CHILD   . GERD (gastroesophageal reflux disease)   . Headache(784.0)    MIGRAINES  . Leg pain, left    with swelling  . Lumbar stenosis    PAIN BACK AND LEFT LEG AND NUMBNESS LEG AND FOOT  . Peripheral vascular disease (HCC)    HX OF TREATMENT FOR VARICOSE VEINS RIGHT LEG  . Thyroid disease    TOLD BLOOD LEVELS SLIGHTLY ABNORMAL - BUT NOT REQUIRED MEDICATION    Past Surgical History:  Procedure Laterality Date  . ABDOMINAL HYSTERECTOMY    . BACK SURGERY    . bladder lift     AT SAME TIME OF HYSTERECTOMY  . CHOLECYSTECTOMY    . LUMBAR LAMINECTOMY/DECOMPRESSION MICRODISCECTOMY Left 10/24/2014   Procedure: COMPLETE LUMBAR DECOMPRESSION L4-L5 CENTRAL AND MICRODISCECTOMY L4-L5 LEFT;  Surgeon: Jacki Cones, MD;  Location: [facility];  Service: Orthopedics;  Laterality: Left;    There were no vitals filed for this visit.   Subjective Assessment - 02/11/21 1618    Subjective "I am doing okay, the shoulder is alittle better but the pain is about 6/10."    Currently in Pain? Yes     Pain Orientation Right;Left    Pain Descriptors / Indicators Aching    Pain Type Chronic pain    Pain Onset More than a month ago    Pain Frequency Intermittent              [facility] PT Assessment - 02/11/21 0001      Assessment   Medical Diagnosis Spondylolisthesis, lumbar region (M43.16), Tendonitis of both shoulders (M77.8)    Referring Provider (PT) [name]                         [facility] Adult PT Treatment/Exercise - 02/11/21 0001      Therapeutic Activites    Therapeutic Activities Other Therapeutic Activities    Other Therapeutic Activities practicing folding sheets utilzing proper form and folding table. she noted reduced pain.      Shoulder Exercises: Supine   Protraction Strengthening;Right   rhythmic stabilization     Shoulder Exercises: Seated   Other Seated Exercises deltoid strengthing 2 x 15 2#      Shoulder Exercises: Sidelying   Other Sidelying Exercises eccentric abductor strengthening with red theraband 2 x 15      Iontophoresis  Type of Iontophoresis Dexamethasone    Location R anterior/ middle deltoid    Dose 4mg /ml    Time 6 hour patch      Manual Therapy   Manual therapy comments MTPR along the R deltoid and bicep    Soft tissue mobilization IASTM along the middle /anterior deltoid and proximal long head of bicep tendon      Neck Exercises: Stretches   Other Neck Stretches posterior capsule stretch 2 x 30                  PT Education - 02/11/21 1710    Education Details reviewed household ADLs with folding and doing laundry, cooking/ cleaning to avoid shoulder issues.    Person(s) Educated Patient    Methods Explanation;Verbal cues;Demonstration    Comprehension Verbalized understanding;Verbal cues required;Returned demonstration            PT Short Term Goals - 01/07/21 1558      PT SHORT TERM GOAL #1   Title pt to be I with inital HEP    Baseline -    Time 3    Period Weeks    Status New    Target  Date 01/28/21      PT SHORT TERM GOAL #2   Title pt to verbalize/ demo efficient posture and lifting mechanics to reduce and prevent shoulder / back pain    Baseline no previous knowledge of posture.    Time 3    Period Weeks    Status New    Target Date 01/28/21      PT SHORT TERM GOAL #3   Title -      PT SHORT TERM GOAL #4   Title -             PT Long Term Goals - 01/07/21 1559      PT LONG TERM GOAL #1   Title increase R shoulder AROM to >/= 140 degrees for flexion/ abd and maintain functional IR/ ER ROM with </= 3/10 max pain for functiona ROM rquired for ADLs    Time 6    Period Weeks    Status New    Target Date 02/18/21      PT LONG TERM GOAL #2   Title increase bil shoulder strength to >/=4+/5 to promote shoulder stability and assist with lifting mechanics    Time 6    Period Weeks    Status New    Target Date 02/18/21      PT LONG TERM GOAL #3   Title increase FOTO score to >/=66% to demo improvement in function    Time 6    Period Weeks    Status New    Target Date 02/18/21      PT LONG TERM GOAL #4   Title pt to be I with all HEP given as of last visit to maintain and progress current level function    Time 6    Period Weeks    Status New    Target Date 02/18/21      PT LONG TERM GOAL #5   Title -                 Plan - 02/11/21 1711    Clinical Impression Statement pt reports she feels like she is making some progress but cnotnineus to note pain located along the middle deltoid and greater tubercle. She does continue to respond to [facility] for the deltoid, and IASTM techniques. reviewed  posture and mechinacs with household activities including folding laundry which she noted decreased pain with folding linens in the clinic with demonstration and cues for proper form.    PT Treatment/Interventions ADLs/Self Care Home Management;Iontophoresis 4mg /ml Dexamethasone;Moist Heat;Traction;Electrical Stimulation;Therapeutic exercise;Therapeutic  activities;Balance training;Gait training;Cryotherapy;Neuromuscular re-education;Patient/family education;Manual techniques;Passive range of motion;Dry needling;Taping    PT Next Visit Plan revieiw / updated HEP PRN, STW for R upper trap/ levator scapulae, scapualr mobs, discuss DN, response to / IASTM techniques. posture educatoin, iontophoresis, functional lifting. eccentrics for biceps    PT Home Exercise Plan [place] - chin tuck, upper trap/ levator scapulae stretch, scapular retraction    Consulted and Agree with Plan of Care Patient           Patient will benefit from skilled therapeutic intervention in order to improve the following deficits and impairments:  Improper body mechanics,Increased muscle spasms,Decreased strength,Pain,Postural dysfunction,Decreased activity tolerance,Decreased endurance  Visit Diagnosis: Chronic right shoulder pain  Chronic left shoulder pain  Muscle weakness (generalized)  Chronic right-sided low back pain without sciatica     Problem List Patient Active Problem List   Diagnosis Date Noted  . Body mass index (BMI) of 35.0-35.9 in adult 06/04/2020  . History of migraine headaches 12/06/2019  . Dyslipidemia 06/30/2019  . GERD with esophagitis 06/30/2019  . Prediabetes 10/17/2015  . Spinal stenosis, lumbar region, with neurogenic claudication 10/24/2014   10/26/2014 PT, DPT, LAT, ATC  02/11/21  5:15 PM      [facility] [address] [place], [place], [place] Phone: [phone]   Fax:  [phone]  Name: Kendra Rodriguez MRN: [name] Date of Birth: [birth year]

## 2021-02-18 ENCOUNTER — Ambulatory Visit: Payer: 59 | Admitting: Specialist

## 2021-02-26 ENCOUNTER — Other Ambulatory Visit: Payer: Self-pay

## 2021-02-26 ENCOUNTER — Ambulatory Visit: Payer: 59 | Admitting: Physical Therapy

## 2021-02-26 ENCOUNTER — Encounter: Payer: Self-pay | Admitting: Physical Therapy

## 2021-02-26 DIAGNOSIS — M25512 Pain in left shoulder: Secondary | ICD-10-CM

## 2021-02-26 DIAGNOSIS — M545 Low back pain, unspecified: Secondary | ICD-10-CM

## 2021-02-26 DIAGNOSIS — G8929 Other chronic pain: Secondary | ICD-10-CM

## 2021-02-26 DIAGNOSIS — M25511 Pain in right shoulder: Secondary | ICD-10-CM | POA: Diagnosis not present

## 2021-02-26 DIAGNOSIS — M6281 Muscle weakness (generalized): Secondary | ICD-10-CM

## 2021-02-26 NOTE — Therapy (Signed)
[facility] Outpatient Rehabilitation [facility] [address] [place], [place], [address] Phone: [phone]   Fax:  [phone]  Physical Therapy Treatment / Re-certification  Patient Details  Name: Kendra Rodriguez MRN: [id number] Date of Birth: 1966/05/01 Referring Provider (PT): [name]   Encounter Date: 02/26/2021   PT End of Session - 02/26/21 1411    Visit Number 10    Number of Visits 13    Date for PT Re-Evaluation 03/25/21    Authorization Type [employer], FOTO 10th visit    PT Start Time 1414    PT Stop Time 1455    PT Time Calculation (min) 41 min    Activity Tolerance Patient tolerated treatment well    Behavior During Therapy [facility] for tasks assessed/performed           Past Medical History:  Diagnosis Date  . Acute sinusitis   . Allergy   . Anxiety   . Depression    NO MEDICATIONS - DOING OK NOW  . Dizziness   . Ear ache    RIGHT --HX OF MULTIPLE EAR INFECTIONS AS A CHILD   . GERD (gastroesophageal reflux disease)   . Headache(784.0)    MIGRAINES  . Leg pain, left    with swelling  . Lumbar stenosis    PAIN BACK AND LEFT LEG AND NUMBNESS LEG AND FOOT  . Peripheral vascular disease (HCC)    HX OF TREATMENT FOR VARICOSE VEINS RIGHT LEG  . Thyroid disease    TOLD BLOOD LEVELS SLIGHTLY ABNORMAL - BUT NOT REQUIRED MEDICATION    Past Surgical History:  Procedure Laterality Date  . ABDOMINAL HYSTERECTOMY    . BACK SURGERY    . bladder lift     AT SAME TIME OF HYSTERECTOMY  . CHOLECYSTECTOMY    . LUMBAR LAMINECTOMY/DECOMPRESSION MICRODISCECTOMY Left 10/24/2014   Procedure: COMPLETE LUMBAR DECOMPRESSION L4-L5 CENTRAL AND MICRODISCECTOMY L4-L5 LEFT;  Surgeon: Jacki Cones, MD;  Location: [facility];  Service: Orthopedics;  Laterality: Left;    There were no vitals filed for this visit.   Subjective Assessment - 02/26/21 1414    Subjective "sometimes the pain goes up and down, pain is in the side of the shoulder, it is getting  better in the top of the shoulder."    Currently in Pain? Yes    Pain Score 5     Pain Orientation Right    Pain Descriptors / Indicators Aching    Pain Type Chronic pain    Pain Onset More than a month ago    Pain Frequency Intermittent    Aggravating Factors  washing hair for long time, and lifting too much    Pain Relieving Factors exercise              [facility] PT Assessment - 02/26/21 0001      Assessment   Medical Diagnosis Spondylolisthesis, lumbar region (M43.16), Tendonitis of both shoulders (M77.8)    Referring Provider (PT) [name]      Observation/Other Assessments   Focus on Therapeutic Outcomes (FOTO)  45% function      AROM   Right Shoulder Flexion 100 Degrees   following session 135 degrees   Right Shoulder ABduction 120 Degrees   w/ hiking, following session 160 degrees   Right Shoulder Internal Rotation --   T8   Right Shoulder External Rotation --   T4  [facility] Adult PT Treatment/Exercise - 02/26/21 0001      Shoulder Exercises: Supine   Protraction Strengthening;Right   rhythmic stabilization     Shoulder Exercises: Seated   Other Seated Exercises double ER in horizontal adduction with scapular retraction 2 x 10 with green band   for lower trap activation     Shoulder Exercises: Standing   Flexion 15 reps   x 2 sets   Shoulder Flexion Weight (lbs) 2      Modalities   Modalities Iontophoresis      Iontophoresis   Type of Iontophoresis Dexamethasone    Location R anterior/ middle deltoid    Dose 4mg /ml    Time 6 hour patch      Manual Therapy   Manual therapy comments skilled palpation and monitoring of pt during TPDN            Trigger Point Dry Needling - 02/26/21 0001    Consent Given? Yes    Muscles Treated Head and Neck Upper trapezius    Muscles Treated Upper Quadrant Supraspinatus    Supraspinatus Response Twitch response elicited;Palpable increased muscle length    Deltoid Response Twitch  response elicited;Palpable increased muscle length                PT Education - 02/26/21 1421    Education Details reviewed FOTO assessment, reviewed HEP    Person(s) Educated Patient    Methods Explanation;Verbal cues    Comprehension Verbalized understanding;Verbal cues required            PT Short Term Goals - 02/26/21 1423      PT SHORT TERM GOAL #1   Title pt to be I with inital HEP    Period Weeks    Status Achieved      PT SHORT TERM GOAL #2   Title pt to verbalize/ demo efficient posture and lifting mechanics to reduce and prevent shoulder / back pain    Period Weeks    Status Achieved             PT Long Term Goals - 02/26/21 1426      PT LONG TERM GOAL #1   Title increase R shoulder AROM to >/= 140 degrees for flexion/ abd and maintain functional IR/ ER ROM with </= 3/10 max pain for functiona ROM rquired for ADLs    Period Weeks    Status On-going      PT LONG TERM GOAL #2   Title increase bil shoulder strength to >/=4+/5 to promote shoulder stability and assist with lifting mechanics    Status On-going      PT LONG TERM GOAL #3   Title increase FOTO score to >/=66% to demo improvement in function    Period Weeks    Status On-going      PT LONG TERM GOAL #4   Title pt to be I with all HEP given as of last visit to maintain and progress current level function    Time 6    Period Weeks    Status On-going      PT LONG TERM GOAL #5   Status On-going                 Plan - 02/26/21 1457    Clinical Impression Statement pt arrives reporting pain at 5/10 today in the R shoulder and demonstrates limited shoulder AROM intially. special testing continues to confirm likekly dx of impingment. continued TPDN focusing on the R upper  trap/ supraspinatus folllowed scapualohumeral rhythm exercises which she did well with. Following session she demonstrated significant improvement in ROM. continued application of ionto end ofsession to calm down  inflammation. pt would benefit from continued physical therapy to reduce shoulder pain, improve gross strength, and overall function by addressing the deficits listed.    PT Frequency 2x / week    PT Duration 4 weeks    PT Treatment/Interventions ADLs/Self Care Home Management;Iontophoresis 4mg /ml Dexamethasone;Moist Heat;Traction;Electrical Stimulation;Therapeutic exercise;Therapeutic activities;Balance training;Gait training;Cryotherapy;Neuromuscular re-education;Patient/family education;Manual techniques;Passive range of motion;Dry needling;Taping    PT Next Visit Plan revieiw / updated HEP PRN, STW for R upper trap/ levator scapulae, scapualr mobs, discuss DN, response to / IASTM techniques. posture educatoin, iontophoresis, functional lifting. eccentrics for biceps    PT Home Exercise Plan [place] - chin tuck, upper trap/ levator scapulae stretch, scapular retraction    Consulted and Agree with Plan of Care Patient           Patient will benefit from skilled therapeutic intervention in order to improve the following deficits and impairments:  Improper body mechanics,Increased muscle spasms,Decreased strength,Pain,Postural dysfunction,Decreased activity tolerance,Decreased endurance  Visit Diagnosis: Chronic right shoulder pain  Chronic left shoulder pain  Muscle weakness (generalized)  Chronic right-sided low back pain without sciatica     Problem List Patient Active Problem List   Diagnosis Date Noted  . Body mass index (BMI) of 35.0-35.9 in adult 06/04/2020  . History of migraine headaches 12/06/2019  . Dyslipidemia 06/30/2019  . GERD with esophagitis 06/30/2019  . Prediabetes 10/17/2015  . Spinal stenosis, lumbar region, with neurogenic claudication 10/24/2014    10/26/2014 PT, DPT, LAT, ATC  02/26/21  3:02 PM      [facility] [place] [address] [place], [place], [place] Phone: [phone]   Fax:   [phone]  Name: Kendra Rodriguez MRN: [name] Date of Birth: September 19, 1966

## 2021-03-01 ENCOUNTER — Other Ambulatory Visit: Payer: Self-pay

## 2021-03-01 ENCOUNTER — Encounter: Payer: Self-pay | Admitting: Physical Medicine and Rehabilitation

## 2021-03-01 ENCOUNTER — Ambulatory Visit (INDEPENDENT_AMBULATORY_CARE_PROVIDER_SITE_OTHER): Payer: 59 | Admitting: Physical Medicine and Rehabilitation

## 2021-03-01 DIAGNOSIS — R202 Paresthesia of skin: Secondary | ICD-10-CM

## 2021-03-01 NOTE — Progress Notes (Signed)
Kendra Rodriguez - 55 y.o. female MRN [id number]  Date of birth: 04/24/1966  Office Visit Note: Visit Date: 03/01/2021 PCP: [name], MD Referred by: [name], *  Subjective: Chief Complaint  Patient presents with  . Right Wrist - Numbness, Pain, Tingling  . Right Hand - Pain, Numbness, Tingling   HPI:  Kendra Rodriguez is a 55 y.o. female who comes in today at the request of Dr. Vira Browns for electrodiagnostic study of the Bilateral upper extremities.  Patient is Right hand dominant.  She describes 7 out of 10 pain with numbness and tingling particularly in the thumb and first digit.  Clearly more radial digits.  She reports that really everything makes the pain worse and no specific things alleviate the symptoms.  She has been using some pain cream for relief.  No prior history of electrodiagnostic study.  She is prediabetic.   ROS Otherwise per HPI.  Assessment & Plan: Visit Diagnoses:    ICD-10-CM   1. Paresthesia of skin  R20.2 NCV with EMG (electromyography)    Plan: Impression: The above electrodiagnostic study is ABNORMAL and reveals evidence of a mild bilateral median nerve entrapment at the wrist (carpal tunnel syndrome) affecting sensory components.   There is no significant electrodiagnostic evidence of any other focal nerve entrapment, brachial plexopathy, cervical radiculopathy or generalized peripheral neuropathy in either upper limb  Recommendations: 1.  Follow-up with referring physician. 2.  Continue current management of symptoms. 3.  Continue use of resting splint at night-time and as needed during the day.  Meds & Orders: No orders of the defined types were placed in this encounter.   Orders Placed This Encounter  Procedures  . NCV with EMG (electromyography)    Follow-up: Return for Vira Browns, MD.   Procedures: No procedures performed  EMG & NCV Findings: Evaluation of the left median (across palm) sensory and the right  median (across palm) sensory nerves showed prolonged distal peak latency (Wrist, L4.0, R4.0 ms) and prolonged distal peak latency (Palm, L2.1, R2.1 ms).  All remaining nerves (as indicated in the following tables) were within normal limits.  All left vs. right side differences were within normal limits.    All examined muscles (as indicated in the following table) showed no evidence of electrical instability.    Impression: The above electrodiagnostic study is ABNORMAL and reveals evidence of a mild bilateral median nerve entrapment at the wrist (carpal tunnel syndrome) affecting sensory components.   There is no significant electrodiagnostic evidence of any other focal nerve entrapment, brachial plexopathy, cervical radiculopathy or generalized peripheral neuropathy in either upper limb  Recommendations: 1.  Follow-up with referring physician. 2.  Continue current management of symptoms. 3.  Continue use of resting splint at night-time and as needed during the day.  ___________________________ Elease Hashimoto Board Certified, American Board of Physical Medicine and Rehabilitation    Nerve Conduction Studies Anti Sensory Summary Table   Stim Site NR Peak (ms) Norm Peak (ms) P-T Amp (V) Norm P-T Amp Site1 Site2 Delta-P (ms) Dist (cm) Vel (m/s) Norm Vel (m/s)  Left Median Acr Palm Anti Sensory (2nd Digit)  30.1C  Wrist    *4.0 <3.6 25.8 >10 Wrist Palm 1.9 0.0    Palm    *2.1 <2.0 46.1         Right Median Acr Palm Anti Sensory (2nd Digit)  30.4C  Wrist    *4.0 <3.6 30.6 >10 Wrist Palm 1.9 0.0  Palm    *2.1 <2.0 28.4         Left Radial Anti Sensory (Base 1st Digit)  30.2C  Wrist    2.2 <3.1 34.9  Wrist Base 1st Digit 2.2 0.0    Right Radial Anti Sensory (Base 1st Digit)  31C  Wrist    2.2 <3.1 18.8  Wrist Base 1st Digit 2.2 0.0    Left Ulnar Anti Sensory (5th Digit)  30.4C  Wrist    3.7 <3.7 20.2 >15.0 Wrist 5th Digit 3.7 14.0 38 >38  Right Ulnar Anti Sensory (5th Digit)   30.9C  Wrist    3.4 <3.7 26.9 >15.0 Wrist 5th Digit 3.4 14.0 41 >38   Motor Summary Table   Stim Site NR Onset (ms) Norm Onset (ms) O-P Amp (mV) Norm O-P Amp Site1 Site2 Delta-0 (ms) Dist (cm) Vel (m/s) Norm Vel (m/s)  Left Median Motor (Abd Poll Brev)  30.3C  Wrist    3.7 <4.2 8.0 >5 Elbow Wrist 3.9 20.0 51 >50  Elbow    7.6  7.7         Right Median Motor (Abd Poll Brev)  31C  Wrist    3.9 <4.2 7.7 >5 Elbow Wrist 3.8 20.0 53 >50  Elbow    7.7  7.6         Left Ulnar Motor (Abd Dig Min)  30.6C  Wrist    3.6 <4.2 10.1 >3 B Elbow Wrist 2.7 18.0 67 >53  B Elbow    6.3  10.2  A Elbow B Elbow 1.4 10.0 71 >53  A Elbow    7.7  10.1         Right Ulnar Motor (Abd Dig Min)  31.2C  Wrist    3.0 <4.2 8.8 >3 B Elbow Wrist 3.0 18.5 62 >53  B Elbow    6.0  8.6  A Elbow B Elbow 1.4 10.0 71 >53  A Elbow    7.4  8.3          EMG   Side Muscle Nerve Root Ins Act Fibs Psw Amp Dur Poly Recrt Int Dennie Bible Comment  Right Abd Poll Brev Median C8-T1 Nml Nml Nml Nml Nml 0 Nml Nml   Right 1stDorInt Ulnar C8-T1 Nml Nml Nml Nml Nml 0 Nml Nml   Right PronatorTeres Median C6-7 Nml Nml Nml Nml Nml 0 Nml Nml   Right Biceps Musculocut C5-6 Nml Nml Nml Nml Nml 0 Nml Nml   Right Deltoid Axillary C5-6 Nml Nml Nml Nml Nml 0 Nml Nml     Nerve Conduction Studies Anti Sensory Left/Right Comparison   Stim Site L Lat (ms) R Lat (ms) L-R Lat (ms) L Amp (V) R Amp (V) L-R Amp (%) Site1 Site2 L Vel (m/s) R Vel (m/s) L-R Vel (m/s)  Median Acr Palm Anti Sensory (2nd Digit)  30.1C  Wrist *4.0 *4.0 0.0 25.8 30.6 15.7 Wrist Palm     Palm *2.1 *2.1 0.0 46.1 28.4 38.4       Radial Anti Sensory (Base 1st Digit)  30.2C  Wrist 2.2 2.2 0.0 34.9 18.8 46.1 Wrist Base 1st Digit     Ulnar Anti Sensory (5th Digit)  30.4C  Wrist 3.7 3.4 0.3 20.2 26.9 24.9 Wrist 5th Digit 38 41 3   Motor Left/Right Comparison   Stim Site L Lat (ms) R Lat (ms) L-R Lat (ms) L Amp (mV) R Amp (mV) L-R Amp (%) Site1 Site2 L Vel (m/s) R Vel (m/s) L-R  Vel (m/s)  Median Motor (Abd Poll Brev)  30.3C  Wrist 3.7 3.9 0.2 8.0 7.7 3.7 Elbow Wrist 51 53 2  Elbow 7.6 7.7 0.1 7.7 7.6 1.3       Ulnar Motor (Abd Dig Min)  30.6C  Wrist 3.6 3.0 0.6 10.1 8.8 12.9 B Elbow Wrist 67 62 5  B Elbow 6.3 6.0 0.3 10.2 8.6 15.7 A Elbow B Elbow 71 71 0  A Elbow 7.7 7.4 0.3 10.1 8.3 17.8          Waveforms:                      Clinical History: No specialty comments available.     Objective:  VS:  HT:    WT:   BMI:     BP:   HR: bpm  TEMP: ( )  RESP:  Physical Exam Musculoskeletal:        General: No swelling, tenderness or deformity.     Comments: Inspection reveals no atrophy of the bilateral APB or FDI or hand intrinsics. There is no swelling, color changes, allodynia or dystrophic changes. There is 5 out of 5 strength in the bilateral wrist extension, finger abduction and long finger flexion. There is intact sensation to light touch in all dermatomal and peripheral nerve distributions.  There is a negative Phalen's test bilaterally. There is a negative Hoffmann's test bilaterally.  Skin:    General: Skin is warm and dry.     Findings: No erythema or rash.  Neurological:     General: No focal deficit present.     Mental Status: She is alert and oriented to person, place, and time.     Motor: No weakness or abnormal muscle tone.     Coordination: Coordination normal.  Psychiatric:        Mood and Affect: Mood normal.        Behavior: Behavior normal.      Imaging: No results found.

## 2021-03-01 NOTE — Procedures (Signed)
EMG & NCV Findings: Evaluation of the left median (across palm) sensory and the right median (across palm) sensory nerves showed prolonged distal peak latency (Wrist, L4.0, R4.0 ms) and prolonged distal peak latency (Palm, L2.1, R2.1 ms).  All remaining nerves (as indicated in the following tables) were within normal limits.  All left vs. right side differences were within normal limits.    All examined muscles (as indicated in the following table) showed no evidence of electrical instability.    Impression: The above electrodiagnostic study is ABNORMAL and reveals evidence of a mild bilateral median nerve entrapment at the wrist (carpal tunnel syndrome) affecting sensory components.   There is no significant electrodiagnostic evidence of any other focal nerve entrapment, brachial plexopathy, cervical radiculopathy or generalized peripheral neuropathy in either upper limb  Recommendations: 1.  Follow-up with referring physician. 2.  Continue current management of symptoms. 3.  Continue use of resting splint at night-time and as needed during the day.  ___________________________ Elease Hashimoto Board Certified, American Board of Physical Medicine and Rehabilitation    Nerve Conduction Studies Anti Sensory Summary Table   Stim Site NR Peak (ms) Norm Peak (ms) P-T Amp (V) Norm P-T Amp Site1 Site2 Delta-P (ms) Dist (cm) Vel (m/s) Norm Vel (m/s)  Left Median Acr Palm Anti Sensory (2nd Digit)  30.1C  Wrist    *4.0 <3.6 25.8 >10 Wrist Palm 1.9 0.0    Palm    *2.1 <2.0 46.1         Right Median Acr Palm Anti Sensory (2nd Digit)  30.4C  Wrist    *4.0 <3.6 30.6 >10 Wrist Palm 1.9 0.0    Palm    *2.1 <2.0 28.4         Left Radial Anti Sensory (Base 1st Digit)  30.2C  Wrist    2.2 <3.1 34.9  Wrist Base 1st Digit 2.2 0.0    Right Radial Anti Sensory (Base 1st Digit)  31C  Wrist    2.2 <3.1 18.8  Wrist Base 1st Digit 2.2 0.0    Left Ulnar Anti Sensory (5th Digit)  30.4C  Wrist    3.7  <3.7 20.2 >15.0 Wrist 5th Digit 3.7 14.0 38 >38  Right Ulnar Anti Sensory (5th Digit)  30.9C  Wrist    3.4 <3.7 26.9 >15.0 Wrist 5th Digit 3.4 14.0 41 >38   Motor Summary Table   Stim Site NR Onset (ms) Norm Onset (ms) O-P Amp (mV) Norm O-P Amp Site1 Site2 Delta-0 (ms) Dist (cm) Vel (m/s) Norm Vel (m/s)  Left Median Motor (Abd Poll Brev)  30.3C  Wrist    3.7 <4.2 8.0 >5 Elbow Wrist 3.9 20.0 51 >50  Elbow    7.6  7.7         Right Median Motor (Abd Poll Brev)  31C  Wrist    3.9 <4.2 7.7 >5 Elbow Wrist 3.8 20.0 53 >50  Elbow    7.7  7.6         Left Ulnar Motor (Abd Dig Min)  30.6C  Wrist    3.6 <4.2 10.1 >3 B Elbow Wrist 2.7 18.0 67 >53  B Elbow    6.3  10.2  A Elbow B Elbow 1.4 10.0 71 >53  A Elbow    7.7  10.1         Right Ulnar Motor (Abd Dig Min)  31.2C  Wrist    3.0 <4.2 8.8 >3 B Elbow Wrist 3.0 18.5 62 >53  B  Elbow    6.0  8.6  A Elbow B Elbow 1.4 10.0 71 >53  A Elbow    7.4  8.3          EMG   Side Muscle Nerve Root Ins Act Fibs Psw Amp Dur Poly Recrt Int Dennie Bible Comment  Right Abd Poll Brev Median C8-T1 Nml Nml Nml Nml Nml 0 Nml Nml   Right 1stDorInt Ulnar C8-T1 Nml Nml Nml Nml Nml 0 Nml Nml   Right PronatorTeres Median C6-7 Nml Nml Nml Nml Nml 0 Nml Nml   Right Biceps Musculocut C5-6 Nml Nml Nml Nml Nml 0 Nml Nml   Right Deltoid Axillary C5-6 Nml Nml Nml Nml Nml 0 Nml Nml     Nerve Conduction Studies Anti Sensory Left/Right Comparison   Stim Site L Lat (ms) R Lat (ms) L-R Lat (ms) L Amp (V) R Amp (V) L-R Amp (%) Site1 Site2 L Vel (m/s) R Vel (m/s) L-R Vel (m/s)  Median Acr Palm Anti Sensory (2nd Digit)  30.1C  Wrist *4.0 *4.0 0.0 25.8 30.6 15.7 Wrist Palm     Palm *2.1 *2.1 0.0 46.1 28.4 38.4       Radial Anti Sensory (Base 1st Digit)  30.2C  Wrist 2.2 2.2 0.0 34.9 18.8 46.1 Wrist Base 1st Digit     Ulnar Anti Sensory (5th Digit)  30.4C  Wrist 3.7 3.4 0.3 20.2 26.9 24.9 Wrist 5th Digit 38 41 3   Motor Left/Right Comparison   Stim Site L Lat (ms) R Lat (ms)  L-R Lat (ms) L Amp (mV) R Amp (mV) L-R Amp (%) Site1 Site2 L Vel (m/s) R Vel (m/s) L-R Vel (m/s)  Median Motor (Abd Poll Brev)  30.3C  Wrist 3.7 3.9 0.2 8.0 7.7 3.7 Elbow Wrist 51 53 2  Elbow 7.6 7.7 0.1 7.7 7.6 1.3       Ulnar Motor (Abd Dig Min)  30.6C  Wrist 3.6 3.0 0.6 10.1 8.8 12.9 B Elbow Wrist 67 62 5  B Elbow 6.3 6.0 0.3 10.2 8.6 15.7 A Elbow B Elbow 71 71 0  A Elbow 7.7 7.4 0.3 10.1 8.3 17.8          Waveforms:

## 2021-03-01 NOTE — Progress Notes (Signed)
Pt state right wrist pain. Pt state her finger goes numb and has a burning feeling. Pt state she feel pain mostly on her top thumb. Pt state everything she do makes the pain worse. Pt state she [place] pain cream to ease her pain. Pt state she right handed.   Numeric Pain Rating Scale and Functional Assessment Average Pain 7   In the last MONTH (on 0-10 scale) has pain interfered with the following?  1. General activity like being  able to carry out your everyday physical activities such as walking, climbing stairs, carrying groceries, or moving a chair?  Rating(9)

## 2021-03-04 ENCOUNTER — Ambulatory Visit: Payer: 59 | Attending: Specialist | Admitting: Physical Therapy

## 2021-03-04 ENCOUNTER — Other Ambulatory Visit: Payer: Self-pay

## 2021-03-04 DIAGNOSIS — M545 Low back pain, unspecified: Secondary | ICD-10-CM

## 2021-03-04 DIAGNOSIS — G8929 Other chronic pain: Secondary | ICD-10-CM

## 2021-03-04 DIAGNOSIS — M25512 Pain in left shoulder: Secondary | ICD-10-CM | POA: Diagnosis present

## 2021-03-04 DIAGNOSIS — M6281 Muscle weakness (generalized): Secondary | ICD-10-CM | POA: Diagnosis present

## 2021-03-04 DIAGNOSIS — M25511 Pain in right shoulder: Secondary | ICD-10-CM | POA: Insufficient documentation

## 2021-03-04 NOTE — Therapy (Signed)
[facility] [address] [place], [place], [address] Phone: [phone]   Fax:  [phone]  Physical Therapy Treatment  Patient Details  Name: Kendra Rodriguez MRN: [id number] Date of Birth: [birth year] Referring Provider (PT): [name]   Encounter Date: 03/04/2021   PT End of Session - 03/04/21 1501    Visit Number 11    Number of Visits 13    Date for PT Re-Evaluation 03/25/21    Authorization Type [employer], FOTO 10th visit    PT Start Time 1501    PT Stop Time 1541    PT Time Calculation (min) 40 min    Activity Tolerance Patient tolerated treatment well    Behavior During Therapy [facility] for tasks assessed/performed           Past Medical History:  Diagnosis Date  . Acute sinusitis   . Allergy   . Anxiety   . Depression    NO MEDICATIONS - DOING OK NOW  . Dizziness   . Ear ache    RIGHT --HX OF MULTIPLE EAR INFECTIONS AS A CHILD   . GERD (gastroesophageal reflux disease)   . Headache(784.0)    MIGRAINES  . Leg pain, left    with swelling  . Lumbar stenosis    PAIN BACK AND LEFT LEG AND NUMBNESS LEG AND FOOT  . Peripheral vascular disease (HCC)    HX OF TREATMENT FOR VARICOSE VEINS RIGHT LEG  . Thyroid disease    TOLD BLOOD LEVELS SLIGHTLY ABNORMAL - BUT NOT REQUIRED MEDICATION    Past Surgical History:  Procedure Laterality Date  . ABDOMINAL HYSTERECTOMY    . BACK SURGERY    . bladder lift     AT SAME TIME OF HYSTERECTOMY  . CHOLECYSTECTOMY    . LUMBAR LAMINECTOMY/DECOMPRESSION MICRODISCECTOMY Left 10/24/2014   Procedure: COMPLETE LUMBAR DECOMPRESSION L4-L5 CENTRAL AND MICRODISCECTOMY L4-L5 LEFT;  Surgeon: Jacki Cones, MD;  Location: [facility];  Service: Orthopedics;  Laterality: Left;    There were no vitals filed for this visit.   Subjective Assessment - 03/04/21 1506    Subjective " I ma still having pain inthe outside of the R shoiulder the most and the last session, I did get some  testing and positive findings for carpal tunnel"    Currently in Pain? Yes    Pain Score 5     Pain Location Shoulder    Pain Orientation Right    Pain Descriptors / Indicators Aching    Pain Type Chronic pain              [facility] PT Assessment - 03/04/21 0001      Assessment   Medical Diagnosis Spondylolisthesis, lumbar region (M43.16), Tendonitis of both shoulders (M77.8)    Referring Provider (PT) [name]                         [facility] Adult PT Treatment/Exercise - 03/04/21 0001      Exercises   Exercises Wrist      Shoulder Exercises: Supine   Protraction Strengthening;Right   3 x 30 rhythmic stabilization   ABduction Limitations lower trap 2 x 10 with pilates ring and arms pushing      Shoulder Exercises: Seated   Row Strengthening;Right;20 reps;Theraband    Theraband Level (Shoulder Row) Level 3 (Green)    External Rotation Strengthening;Right;20 reps;Theraband    Theraband Level (Shoulder External Rotation) Level 3 (Green)  Internal Rotation Strengthening;20 reps;Theraband    Theraband Level (Shoulder Internal Rotation) Level 3 (Green)      Wrist Exercises   Other wrist exercises wrist tendon glived 1 x 10 holding ea pos x 2 seconds    Other wrist exercises wrist flexor/ extensor 2 x 30      Manual Therapy   Manual Therapy Neural Stretch    Manual therapy comments MPTR along the middle deltoid x 3    Neural Stretch medial nerve glides 2 x 10                    PT Short Term Goals - 02/26/21 1423      PT SHORT TERM GOAL #1   Title pt to be I with inital HEP    Period Weeks    Status Achieved      PT SHORT TERM GOAL #2   Title pt to verbalize/ demo efficient posture and lifting mechanics to reduce and prevent shoulder / back pain    Period Weeks    Status Achieved             PT Long Term Goals - 02/26/21 1426      PT LONG TERM GOAL #1   Title increase R shoulder AROM to >/= 140 degrees for flexion/ abd and  maintain functional IR/ ER ROM with </= 3/10 max pain for functiona ROM rquired for ADLs    Period Weeks    Status On-going      PT LONG TERM GOAL #2   Title increase bil shoulder strength to >/=4+/5 to promote shoulder stability and assist with lifting mechanics    Status On-going      PT LONG TERM GOAL #3   Title increase FOTO score to >/=66% to demo improvement in function    Period Weeks    Status On-going      PT LONG TERM GOAL #4   Title pt to be I with all HEP given as of last visit to maintain and progress current level function    Time 6    Period Weeks    Status On-going      PT LONG TERM GOAL #5   Status On-going                 Plan - 03/04/21 1544    Clinical Impression Statement pt arrives noting conitnued pain in the R shoulder most notably along the L middle deltoid. She responded well to MTPR and general strengthning to promote the shoulder stability. provided information regarding tendon glides to promote relief of wrist pain to help maintain neutral shoulder positiong with ADLs.    PT Treatment/Interventions ADLs/Self Care Home Management;Iontophoresis 4mg /ml Dexamethasone;Moist Heat;Traction;Electrical Stimulation;Therapeutic exercise;Therapeutic activities;Balance training;Gait training;Cryotherapy;Neuromuscular re-education;Patient/family education;Manual techniques;Passive range of motion;Dry needling;Taping    PT Next Visit Plan revieiw / updated HEP PRN, STW for R upper trap/ levator scapulae, scapualr mobs, discuss DN, response to / IASTM techniques. posture educatoin, iontophoresis, functional lifting. eccentrics for biceps    PT Home Exercise Plan [place] - chin tuck, upper trap/ levator scapulae stretch, scapular retraction    Consulted and Agree with Plan of Care Patient           Patient will benefit from skilled therapeutic intervention in order to improve the following deficits and impairments:  Improper body mechanics,Increased muscle  spasms,Decreased strength,Pain,Postural dysfunction,Decreased activity tolerance,Decreased endurance  Visit Diagnosis: Chronic right shoulder pain  Chronic left shoulder pain  Muscle weakness (generalized)  Chronic right-sided low back pain without sciatica     Problem List Patient Active Problem List   Diagnosis Date Noted  . Body mass index (BMI) of 35.0-35.9 in adult 06/04/2020  . History of migraine headaches 12/06/2019  . Dyslipidemia 06/30/2019  . GERD with esophagitis 06/30/2019  . Prediabetes 10/17/2015  . Spinal stenosis, lumbar region, with neurogenic claudication 10/24/2014   Lulu Riding PT, DPT, LAT, ATC  03/04/21  3:51 PM      [facility] [address] [place], [place], [address] Phone: [phone]   Fax:  [phone]  Name: Kendra Rodriguez MRN: [id number] Date of Birth: Sep 22, 1966

## 2021-03-05 ENCOUNTER — Ambulatory Visit: Payer: 59 | Admitting: Physical Therapy

## 2021-03-11 ENCOUNTER — Encounter: Payer: Self-pay | Admitting: Physical Therapy

## 2021-03-11 ENCOUNTER — Other Ambulatory Visit: Payer: Self-pay

## 2021-03-11 ENCOUNTER — Ambulatory Visit: Payer: 59 | Admitting: Physical Therapy

## 2021-03-11 DIAGNOSIS — M25512 Pain in left shoulder: Secondary | ICD-10-CM

## 2021-03-11 DIAGNOSIS — G8929 Other chronic pain: Secondary | ICD-10-CM

## 2021-03-11 DIAGNOSIS — M25511 Pain in right shoulder: Secondary | ICD-10-CM | POA: Diagnosis not present

## 2021-03-11 DIAGNOSIS — M6281 Muscle weakness (generalized): Secondary | ICD-10-CM

## 2021-03-11 DIAGNOSIS — M545 Low back pain, unspecified: Secondary | ICD-10-CM

## 2021-03-11 NOTE — Therapy (Signed)
[facility] [address] [place], [place], [address] Phone: [phone]   Fax:  [phone]  Physical Therapy Treatment  Patient Details  Name: Kendra Rodriguez MRN: [id number] Date of Birth: 18-Jan-1966 Referring Provider (PT): [name]   Encounter Date: 03/11/2021   PT End of Session - 03/11/21 1500    Visit Number 12    Number of Visits 13    Date for PT Re-Evaluation 03/25/21    Authorization Type [employer],    PT Start Time 1500    PT Stop Time 1540    PT Time Calculation (min) 40 min    Activity Tolerance Patient tolerated treatment well    Behavior During Therapy [facility] for tasks assessed/performed           Past Medical History:  Diagnosis Date  . Acute sinusitis   . Allergy   . Anxiety   . Depression    NO MEDICATIONS - DOING OK NOW  . Dizziness   . Ear ache    RIGHT --HX OF MULTIPLE EAR INFECTIONS AS A CHILD   . GERD (gastroesophageal reflux disease)   . Headache(784.0)    MIGRAINES  . Leg pain, left    with swelling  . Lumbar stenosis    PAIN BACK AND LEFT LEG AND NUMBNESS LEG AND FOOT  . Peripheral vascular disease (HCC)    HX OF TREATMENT FOR VARICOSE VEINS RIGHT LEG  . Thyroid disease    TOLD BLOOD LEVELS SLIGHTLY ABNORMAL - BUT NOT REQUIRED MEDICATION    Past Surgical History:  Procedure Laterality Date  . ABDOMINAL HYSTERECTOMY    . BACK SURGERY    . bladder lift     AT SAME TIME OF HYSTERECTOMY  . CHOLECYSTECTOMY    . LUMBAR LAMINECTOMY/DECOMPRESSION MICRODISCECTOMY Left 10/24/2014   Procedure: COMPLETE LUMBAR DECOMPRESSION L4-L5 CENTRAL AND MICRODISCECTOMY L4-L5 LEFT;  Surgeon: Jacki Cones, MD;  Location: [facility];  Service: Orthopedics;  Laterality: Left;    There were no vitals filed for this visit.   Subjective Assessment - 03/11/21 1501    Subjective "I am feeling a little better today,but I think it didn't do as much today. "    Currently in Pain? Yes    Pain Score 5      Pain Location Shoulder    Pain Orientation Right              [facility] PT Assessment - 03/11/21 0001      Assessment   Medical Diagnosis Spondylolisthesis, lumbar region (M43.16), Tendonitis of both shoulders (M77.8)    Referring Provider (PT) [name]                         [facility] Adult PT Treatment/Exercise - 03/11/21 0001      Shoulder Exercises: Seated   Horizontal ABduction Strengthening;20 reps;Theraband    Theraband Level (Shoulder Horizontal ABduction) Level 2 (Red)    Other Seated Exercises double ER with scapulare retraction 2 x 20 with red theraband      Shoulder Exercises: Sidelying   Other Sidelying Exercises abduction 2 x 20      Shoulder Exercises: ROM/Strengthening   UBE (Upper Arm Bike) L2 x 4 min   fwd/bwd x 2 min ea.     Manual Therapy   Manual therapy comments skilled palpation and monitoring of pt during TPDN    Soft tissue mobilization IASTM along the middle /anterior deltoid and proximal  long head of bicep tendon            Trigger Point Dry Needling - 03/11/21 0001    Consent Given? Yes    Education Handout Provided Previously provided    Electrical Stimulation Performed with Dry Needling Yes    E-stim with Dry Needling Details CPS 20 intensity to tolerance increased increasing as tolerated x 8 min    Deltoid Response Twitch response elicited;Palpable increased muscle length   middle deltoid                 PT Short Term Goals - 02/26/21 1423      PT SHORT TERM GOAL #1   Title pt to be I with inital HEP    Period Weeks    Status Achieved      PT SHORT TERM GOAL #2   Title pt to verbalize/ demo efficient posture and lifting mechanics to reduce and prevent shoulder / back pain    Period Weeks    Status Achieved             PT Long Term Goals - 02/26/21 1426      PT LONG TERM GOAL #1   Title increase R shoulder AROM to >/= 140 degrees for flexion/ abd and maintain functional IR/ ER ROM with </= 3/10 max  pain for functiona ROM rquired for ADLs    Period Weeks    Status On-going      PT LONG TERM GOAL #2   Title increase bil shoulder strength to >/=4+/5 to promote shoulder stability and assist with lifting mechanics    Status On-going      PT LONG TERM GOAL #3   Title increase FOTO score to >/=66% to demo improvement in function    Period Weeks    Status On-going      PT LONG TERM GOAL #4   Title pt to be I with all HEP given as of last visit to maintain and progress current level function    Time 6    Period Weeks    Status On-going      PT LONG TERM GOAL #5   Status On-going                 Plan - 03/11/21 1536    Clinical Impression Statement Pt reports she hasn't done as much at home and is feeling a little better. continued DN focusing on the middle deltoid combined with e-stim followed with IASTM techniques which she noted decreased pain. she was able to to all exercises today with no increase in pain noting pain dropped to 3/10.    PT Treatment/Interventions ADLs/Self Care Home Management;Iontophoresis 4mg /ml Dexamethasone;Moist Heat;Traction;Electrical Stimulation;Therapeutic exercise;Therapeutic activities;Balance training;Gait training;Cryotherapy;Neuromuscular re-education;Patient/family education;Manual techniques;Passive range of motion;Dry needling;Taping    PT Next Visit Plan revieiw / updated HEP PRN, STW for R upper trap/ levator scapulae, response DN with E-stimIASTM techniques. posture education, iontophoresis, functional lifting. eccentrics for biceps    PT Home Exercise Plan - chin tuck, upper trap/ levator scapulae stretch, scapular retraction    Consulted and Agree with Plan of Care Patient           Patient will benefit from skilled therapeutic intervention in order to improve the following deficits and impairments:  Improper body mechanics,Increased muscle spasms,Decreased strength,Pain,Postural dysfunction,Decreased activity  tolerance,Decreased endurance  Visit Diagnosis: Chronic right shoulder pain  Chronic left shoulder pain  Muscle weakness (generalized)  Chronic right-sided low back pain without sciatica  Problem List Patient Active Problem List   Diagnosis Date Noted  . Body mass index (BMI) of 35.0-35.9 in adult 06/04/2020  . History of migraine headaches 12/06/2019  . Dyslipidemia 06/30/2019  . GERD with esophagitis 06/30/2019  . Prediabetes 10/17/2015  . Spinal stenosis, lumbar region, with neurogenic claudication 10/24/2014   Lulu Riding PT, DPT, LAT, ATC  03/11/21  3:40 PM      [facility] [address] [place], [place], [address] Phone: [phone]   Fax:  [phone]  Name: LEOLIA VINZANT MRN: [id number] Date of Birth: 31-Dec-1965

## 2021-03-18 ENCOUNTER — Encounter: Payer: Self-pay | Admitting: Physical Therapy

## 2021-03-18 ENCOUNTER — Ambulatory Visit: Payer: 59 | Admitting: Physical Therapy

## 2021-03-18 ENCOUNTER — Other Ambulatory Visit: Payer: Self-pay

## 2021-03-18 DIAGNOSIS — M25511 Pain in right shoulder: Secondary | ICD-10-CM | POA: Diagnosis not present

## 2021-03-18 DIAGNOSIS — M25512 Pain in left shoulder: Secondary | ICD-10-CM

## 2021-03-18 DIAGNOSIS — G8929 Other chronic pain: Secondary | ICD-10-CM

## 2021-03-18 DIAGNOSIS — M545 Low back pain, unspecified: Secondary | ICD-10-CM

## 2021-03-18 DIAGNOSIS — M6281 Muscle weakness (generalized): Secondary | ICD-10-CM

## 2021-03-18 NOTE — Therapy (Signed)
[facility] [address] [place], [place], [address] Phone: [phone]   Fax:  [phone]  Physical Therapy Treatment  Patient Details  Name: Kendra Rodriguez MRN: [id number] Date of Birth: 1966/01/28 Referring Provider (PT): [name]   Encounter Date: 03/18/2021   PT End of Session - 03/18/21 1455    Visit Number 13    Number of Visits 13    Date for PT Re-Evaluation 03/25/21    Authorization Type [employer],    PT Start Time 1455    PT Stop Time 1540    PT Time Calculation (min) 45 min    Activity Tolerance Patient tolerated treatment well    Behavior During Therapy [facility] for tasks assessed/performed           Past Medical History:  Diagnosis Date  . Acute sinusitis   . Allergy   . Anxiety   . Depression    NO MEDICATIONS - DOING OK NOW  . Dizziness   . Ear ache    RIGHT --HX OF MULTIPLE EAR INFECTIONS AS A CHILD   . GERD (gastroesophageal reflux disease)   . Headache(784.0)    MIGRAINES  . Leg pain, left    with swelling  . Lumbar stenosis    PAIN BACK AND LEFT LEG AND NUMBNESS LEG AND FOOT  . Peripheral vascular disease (HCC)    HX OF TREATMENT FOR VARICOSE VEINS RIGHT LEG  . Thyroid disease    TOLD BLOOD LEVELS SLIGHTLY ABNORMAL - BUT NOT REQUIRED MEDICATION    Past Surgical History:  Procedure Laterality Date  . ABDOMINAL HYSTERECTOMY    . BACK SURGERY    . bladder lift     AT SAME TIME OF HYSTERECTOMY  . CHOLECYSTECTOMY    . LUMBAR LAMINECTOMY/DECOMPRESSION MICRODISCECTOMY Left 10/24/2014   Procedure: COMPLETE LUMBAR DECOMPRESSION L4-L5 CENTRAL AND MICRODISCECTOMY L4-L5 LEFT;  Surgeon: Jacki Cones, MD;  Location: [facility];  Service: Orthopedics;  Laterality: Left;    There were no vitals filed for this visit.   Subjective Assessment - 03/18/21 1455    Subjective "I am not having alot pain in the R shoulder, ysterday I was having more sotnress in the neck in the Rside of the neck/     Patient Stated Goals to stop the pain,    Currently in Pain? Yes    Pain Score 4               [facility] PT Assessment - 03/18/21 0001      Assessment   Medical Diagnosis Spondylolisthesis, lumbar region (M43.16), Tendonitis of both shoulders (M77.8)    Referring Provider (PT) [name]                         [facility] Adult PT Treatment/Exercise - 03/18/21 0001      Shoulder Exercises: Supine   Protraction Strengthening;20 reps   rhythmic stabilziaton   Protraction Weight (lbs) 3   2 x 15     Shoulder Exercises: Seated   Row Strengthening;Right;20 reps;Theraband    Theraband Level (Shoulder Row) Level 4 (Blue)    Internal Rotation 20 reps;Theraband;Right;Strengthening    Theraband Level (Shoulder Internal Rotation) Level 4 (Blue)    Other Seated Exercises double ER with scapulare retraction 1 x 15 with blue band      Shoulder Exercises: Standing   Flexion Strengthening;10 reps;Weights    Shoulder Flexion Weight (lbs) 3  Shoulder Exercises: ROM/Strengthening   UBE (Upper Arm Bike) L2 x 4 min      Manual Therapy   Manual Therapy Scapular mobilization    Scapular Mobilization upward scapular mobs grade III combined with shoulder AROM flexion / abudction                    PT Short Term Goals - 02/26/21 1423      PT SHORT TERM GOAL #1   Title pt to be I with inital HEP    Period Weeks    Status Achieved      PT SHORT TERM GOAL #2   Title pt to verbalize/ demo efficient posture and lifting mechanics to reduce and prevent shoulder / back pain    Period Weeks    Status Achieved             PT Long Term Goals - 02/26/21 1426      PT LONG TERM GOAL #1   Title increase R shoulder AROM to >/= 140 degrees for flexion/ abd and maintain functional IR/ ER ROM with </= 3/10 max pain for functiona ROM rquired for ADLs    Period Weeks    Status On-going      PT LONG TERM GOAL #2   Title increase bil shoulder strength to >/=4+/5 to promote  shoulder stability and assist with lifting mechanics    Status On-going      PT LONG TERM GOAL #3   Title increase FOTO score to >/=66% to demo improvement in function    Period Weeks    Status On-going      PT LONG TERM GOAL #4   Title pt to be I with all HEP given as of last visit to maintain and progress current level function    Time 6    Period Weeks    Status On-going      PT LONG TERM GOAL #5   Status On-going                 Plan - 03/18/21 1535    Clinical Impression Statement continued working on scpalulohumeral rhythm working on scapular mobs with emphasis on scapular upward assist. she did well with strengthening to promote muscle timing to reduce impingement. end of session she noted decreased pain and soreness. plan to reassess next session to determine if more PT is necessary.    PT Treatment/Interventions ADLs/Self Care Home Management;Iontophoresis 4mg /ml Dexamethasone;Moist Heat;Traction;Electrical Stimulation;Therapeutic exercise;Therapeutic activities;Balance training;Gait training;Cryotherapy;Neuromuscular re-education;Patient/family education;Manual techniques;Passive range of motion;Dry needling;Taping    PT Next Visit Plan revieiw / updated HEP PRN, STW for R upper trap/ levator scapulae, response DN with E-stimIASTM techniques. posture education, iontophoresis, functional lifting.    PT Home Exercise Plan - chin tuck, upper trap/ levator scapulae stretch, scapular retraction    Consulted and Agree with Plan of Care Patient           Patient will benefit from skilled therapeutic intervention in order to improve the following deficits and impairments:  Improper body mechanics,Increased muscle spasms,Decreased strength,Pain,Postural dysfunction,Decreased activity tolerance,Decreased endurance  Visit Diagnosis: Chronic right shoulder pain  Chronic left shoulder pain  Muscle weakness (generalized)  Chronic right-sided low back pain without  sciatica     Problem List Patient Active Problem List   Diagnosis Date Noted  . Body mass index (BMI) of 35.0-35.9 in adult 06/04/2020  . History of migraine headaches 12/06/2019  . Dyslipidemia 06/30/2019  . GERD with esophagitis 06/30/2019  .  Prediabetes 10/17/2015  . Spinal stenosis, lumbar region, with neurogenic claudication 10/24/2014   Lulu Riding PT, DPT, LAT, ATC  03/18/21  3:46 PM      [facility] [address] [place], [place], [address] Phone: [phone]   Fax:  [phone]  Name: Kendra Rodriguez MRN: [id number] Date of Birth: 02/26/1966

## 2021-03-20 ENCOUNTER — Ambulatory Visit: Payer: 59 | Admitting: Specialist

## 2021-03-25 ENCOUNTER — Encounter: Payer: Self-pay | Admitting: Physical Therapy

## 2021-03-25 ENCOUNTER — Other Ambulatory Visit: Payer: Self-pay

## 2021-03-25 ENCOUNTER — Ambulatory Visit: Payer: 59 | Admitting: Physical Therapy

## 2021-03-25 DIAGNOSIS — M6281 Muscle weakness (generalized): Secondary | ICD-10-CM

## 2021-03-25 DIAGNOSIS — G8929 Other chronic pain: Secondary | ICD-10-CM

## 2021-03-25 DIAGNOSIS — M25511 Pain in right shoulder: Secondary | ICD-10-CM | POA: Diagnosis not present

## 2021-03-25 DIAGNOSIS — M25512 Pain in left shoulder: Secondary | ICD-10-CM

## 2021-03-25 DIAGNOSIS — M545 Low back pain, unspecified: Secondary | ICD-10-CM

## 2021-03-25 NOTE — Patient Instructions (Signed)
Access Code: [email or web address] URL: [email or web address] Date: 03/25/2021 Prepared by: Lulu Riding  Exercises Seated Upper Trapezius Stretch - 3 x daily - 7 x weekly - 2 sets - 2 reps - 30 seconds hold Standing Cervical Retraction - 1 x daily - 7 x weekly - 2 sets - 10 reps - 5 hold Seated Scapular Retraction - 1 x daily - 7 x weekly - 10 reps - 2 sets - 5 seconds hold Gentle Levator Scapulae Stretch - 3 x daily - 7 x weekly - 2 sets - 2 reps - 30 seconds hold Wrist Tendon Gliding - 1 x daily - 7 x weekly - 2 sets - 10 reps Seated Wrist Extension Stretch - 3 x daily - 7 x weekly - 2 sets - 2 reps - 30 hold Seated Wrist Flexion Stretch - 3 x daily - 7 x weekly - 2 sets - 2 reps - 30 hold Shoulder Internal Rotation - 1 x daily - 7 x weekly - 2 sets - 10 reps Shoulder External Rotation - 1 x daily - 7 x weekly - 2 sets - 10 reps Standing Shoulder Row with Anchored Resistance - 1 x daily - 7 x weekly - 2 sets - 10 reps

## 2021-03-25 NOTE — Therapy (Signed)
[place], [place], [address] Phone: [phone]   Fax:  [phone]  Physical Therapy Treatment/ Discharge / Patient Details  Name: CLARIVEL CALLAWAY MRN: [id number] Date of Birth: January 06, 1966 Referring Provider (PT): Basil Dess MD   Encounter Date: 03/25/2021   PT End of Session - 03/25/21 1457    Visit Number 14    Number of Visits 14    Date for PT Re-Evaluation 03/25/21    Authorization Type [employer],    PT Start Time 1500    PT Stop Time 1533    PT Time Calculation (min) 33 min    Activity Tolerance Patient tolerated treatment well    Behavior During Therapy [facility] for tasks assessed/performed           Past Medical History:  Diagnosis Date  . Acute sinusitis   . Allergy   . Anxiety   . Depression    NO MEDICATIONS - DOING OK NOW  . Dizziness   . Ear ache    RIGHT --HX OF MULTIPLE EAR INFECTIONS AS A CHILD   . GERD (gastroesophageal reflux disease)   . Headache(784.0)    MIGRAINES  . Leg pain, left    with swelling  . Lumbar stenosis    PAIN BACK AND LEFT LEG AND NUMBNESS LEG AND FOOT  . Peripheral vascular disease (HCC)    HX OF TREATMENT FOR VARICOSE VEINS RIGHT LEG  . Thyroid disease    TOLD BLOOD LEVELS SLIGHTLY ABNORMAL - BUT NOT REQUIRED MEDICATION    Past Surgical History:  Procedure Laterality Date  . ABDOMINAL HYSTERECTOMY    . BACK SURGERY    . bladder lift     AT SAME TIME OF HYSTERECTOMY  . CHOLECYSTECTOMY    . LUMBAR LAMINECTOMY/DECOMPRESSION MICRODISCECTOMY Left 10/24/2014   Procedure: COMPLETE LUMBAR DECOMPRESSION L4-L5 CENTRAL AND MICRODISCECTOMY L4-L5 LEFT;  Surgeon: Tobi Bastos, MD;  Location: [facility];  Service: Orthopedics;  Laterality: Left;    There were no vitals filed for this visit.   Subjective Assessment - 03/25/21 1500    Subjective "I having little pain sholder but its better, i get some soreness on the outside the shoulder and in the back of  the neck."    Currently in Pain? Yes    Pain Score 4     Pain Location Shoulder    Pain Orientation Right    Pain Descriptors / Indicators Aching    Pain Type Chronic pain    Aggravating Factors  doing alot of work at the house.              [facility] PT Assessment - 03/25/21 0001      Assessment   Medical Diagnosis Spondylolisthesis, lumbar region (M43.16), Tendonitis of both shoulders (M77.8)    Referring Provider (PT) Basil Dess MD      Observation/Other Assessments   Focus on Therapeutic Outcomes (FOTO)  55%      AROM   Right Shoulder Flexion 150 Degrees    Right Shoulder ABduction 128 Degrees    Right Shoulder Internal Rotation --   T7   Right Shoulder External Rotation --   T4     Strength   Right Shoulder Flexion 4/5    Right Shoulder Extension 5/5    Right Shoulder ABduction 4/5    Right Shoulder Internal Rotation 4/5    Right Shoulder External Rotation 4-/5    Left Shoulder Flexion 4/5    Left Shoulder  Extension 5/5    Left Shoulder ABduction 4/5    Left Shoulder Internal Rotation 4/5    Left Shoulder External Rotation 4+/5                         [facility] Adult PT Treatment/Exercise - 03/25/21 0001      Shoulder Exercises: Standing   External Rotation Strengthening;Right;20 reps;Theraband    Theraband Level (Shoulder External Rotation) Level 4 (Blue)    Internal Rotation Right;20 reps;Theraband;Strengthening    Theraband Level (Shoulder Internal Rotation) Level 4 (Blue)    Row Strengthening;20 reps;Theraband    Theraband Level (Shoulder Row) Level 4 (Blue)   manual scapular cuing for retraction                 PT Education - 03/25/21 1539    Education Details Reviewed FOTO, reviewed HEP and updated today. discussed proper progression of strengthening.    Person(s) Educated Patient    Methods Explanation;Handout;Verbal cues;Demonstration   manual cues   Comprehension Verbalized understanding;Verbal cues required            PT  Short Term Goals - 02/26/21 1423      PT SHORT TERM GOAL #1   Title pt to be I with inital HEP    Period Weeks    Status Achieved      PT SHORT TERM GOAL #2   Title pt to verbalize/ demo efficient posture and lifting mechanics to reduce and prevent shoulder / back pain    Period Weeks    Status Achieved             PT Long Term Goals - 03/25/21 1510      PT LONG TERM GOAL #1   Title increase R shoulder AROM to >/= 140 degrees for flexion/ abd and maintain functional IR/ ER ROM with </= 3/10 max pain for functiona ROM rquired for ADLs    Baseline pain with reaching behind the back at 4/10    Period Weeks    Status Partially Met      PT LONG TERM GOAL #2   Title increase bil shoulder strength to >/=4+/5 to promote shoulder stability and assist with lifting mechanics    Period Weeks    Status Partially Met      PT LONG TERM GOAL #3   Title increase FOTO score to >/=66% to demo improvement in function    Period Weeks    Status Partially Met      PT LONG TERM GOAL #4   Title pt to be I with all HEP given as of last visit to maintain and progress current level function    Period Weeks    Status Partially Met                 Plan - 03/25/21 1529    Clinical Impression Statement Mrs schoening has made progress with physical therapy increasing shoulder ROM and maintianing strength. she does report continued R shoulder pain that fluctuates depending on activity and notes it calms down fairly quickly with rest. continued  to review HEP and discussed importance of taking breaks to promote relief. She met or partially met all goals. she continues to feel better following exercise which she has capability to perform at home. Based on her current level of function she maximize the benefit she is able to maintain and progress her current LOF.    PT Treatment/Interventions ADLs/Self Care Home Management;Iontophoresis 74m/ml Dexamethasone;Moist  Heat;Traction;Electrical  Stimulation;Therapeutic exercise;Therapeutic activities;Balance training;Gait training;Cryotherapy;Neuromuscular re-education;Patient/family education;Manual techniques;Passive range of motion;Dry needling;Taping    PT Next Visit Plan d/c    PT Home Exercise Plan [email or web address] -    Consulted and Agree with Plan of Care Patient           Patient will benefit from skilled therapeutic intervention in order to improve the following deficits and impairments:  Improper body mechanics,Increased muscle spasms,Decreased strength,Pain,Postural dysfunction,Decreased activity tolerance,Decreased endurance  Visit Diagnosis: Chronic right shoulder pain  Chronic left shoulder pain  Muscle weakness (generalized)  Chronic right-sided low back pain without sciatica     Problem List Patient Active Problem List   Diagnosis Date Noted  . Body mass index (BMI) of 35.0-35.9 in adult 06/04/2020  . History of migraine headaches 12/06/2019  . Dyslipidemia 06/30/2019  . GERD with esophagitis 06/30/2019  . Prediabetes 10/17/2015  . Spinal stenosis, lumbar region, with neurogenic claudication 10/24/2014    [place] 03/25/2021, 3:45 PM  [facility] [address] [place], [place], [address] Phone: [phone]   Fax:  [phone]  Name: LACHINA SALSBERRY MRN: [id number] Date of Birth: 02/10/1966     PHYSICAL THERAPY DISCHARGE SUMMARY  Visits from Start of Care: 14  Current functional level related to goals / functional outcomes: See goals, FOTO 55%   Remaining deficits: See assessment    Education / Equipment: HEP, theraband, posture, importance of rest breaks.   Plan: Patient agrees to discharge.  Patient goals were not met. Patient is being discharged due to being pleased with the current functional level.  ?????         Mayer Vondrak PT, DPT, LAT, ATC  03/25/21  3:47 PM

## 2021-03-29 ENCOUNTER — Other Ambulatory Visit: Payer: Self-pay

## 2021-03-29 ENCOUNTER — Encounter: Payer: Self-pay | Admitting: Endocrinology

## 2021-03-29 ENCOUNTER — Ambulatory Visit: Payer: 59 | Admitting: Endocrinology

## 2021-03-29 DIAGNOSIS — E0590 Thyrotoxicosis, unspecified without thyrotoxic crisis or storm: Secondary | ICD-10-CM | POA: Diagnosis not present

## 2021-03-29 LAB — T4, FREE: Free T4: 0.6 ng/dL (ref 0.60–1.60)

## 2021-03-29 LAB — TSH: TSH: 7.29 u[IU]/mL — ABNORMAL HIGH (ref 0.35–4.50)

## 2021-03-29 MED ORDER — LEVOTHYROXINE SODIUM 50 MCG PO TABS: 50.0000 ug | ORAL_TABLET | Freq: Every day | ORAL | 3 refills | Status: DC

## 2021-03-29 NOTE — Patient Instructions (Signed)
Blood tests are requested for you today.  We'll let you know about the results.  If it is overactive again, I will prescribe for you a pill to slow the thyroid. Please come back for a follow-up appointment in 2 months.    Se solicitan anlisis de sangre para usted hoy. Te informaremos [employer]. Si vuelve a estar hiperactiva, le recetar una pastilla para ralentizar la tiroides. Vuelva para una cita de seguimiento en 2 meses.

## 2021-03-29 NOTE — Progress Notes (Signed)
Subjective:    Patient ID: Kendra Rodriguez, female    DOB: 10/25/1966, 55 y.o.   MRN: [id number]  HPI dtr translates.  Pt is referred by Dr Loreta Ave, for hyperthyroidism.  Pt reports she was dx'ed with hyperthyroidism in 2022.  she has never been on therapy for this.  she has never had XRT to the anterior neck, or thyroid surgery.  she has never had thyroid imaging.  she does not consume kelp or any other non-prescribed thyroid medication.  she has never been on amiodarone.  She reports fatigue, palpitations, doe, heat intolerance, tremor, sweating, and pain at the ant neck.    Past Medical History:  Diagnosis Date  . Acute sinusitis   . Allergy   . Anxiety   . Depression    NO MEDICATIONS - DOING OK NOW  . Dizziness   . Ear ache    RIGHT --HX OF MULTIPLE EAR INFECTIONS AS A CHILD   . GERD (gastroesophageal reflux disease)   . Headache(784.0)    MIGRAINES  . Leg pain, left    with swelling  . Lumbar stenosis    PAIN BACK AND LEFT LEG AND NUMBNESS LEG AND FOOT  . Peripheral vascular disease (HCC)    HX OF TREATMENT FOR VARICOSE VEINS RIGHT LEG  . Thyroid disease    TOLD BLOOD LEVELS SLIGHTLY ABNORMAL - BUT NOT REQUIRED MEDICATION    Past Surgical History:  Procedure Laterality Date  . ABDOMINAL HYSTERECTOMY    . BACK SURGERY    . bladder lift     AT SAME TIME OF HYSTERECTOMY  . CHOLECYSTECTOMY    . LUMBAR LAMINECTOMY/DECOMPRESSION MICRODISCECTOMY Left 10/24/2014   Procedure: COMPLETE LUMBAR DECOMPRESSION L4-L5 CENTRAL AND MICRODISCECTOMY L4-L5 LEFT;  Surgeon: Jacki Cones, MD;  Location: [facility];  Service: Orthopedics;  Laterality: Left;    Social History   Socioeconomic History  . Marital status: Married    Spouse name: Not on file  . Number of children: Not on file  . Years of education: Not on file  . Highest education level: Not on file  Occupational History  . Not on file  Tobacco Use  . Smoking status: Never Smoker  . Smokeless tobacco: Never Used  Vaping Use   . Vaping Use: Never used  Substance and Sexual Activity  . Alcohol use: No  . Drug use: No  . Sexual activity: Not on file  Other Topics Concern  . Not on file  Social History Narrative  . Not on file   Social Determinants of Health   Financial Resource Strain: Not on file  Food Insecurity: Not on file  Transportation Needs: Not on file  Physical Activity: Not on file  Stress: Not on file  Social Connections: Not on file  Intimate Partner Violence: Not on file    Current Outpatient Medications on File Prior to Visit  Medication Sig Dispense Refill  . atorvastatin (LIPITOR) 40 MG tablet TAKE 1 TABLET(40 MG) BY MOUTH DAILY 90 tablet 3  . azithromycin (ZITHROMAX) 250 MG tablet Sig as indicated 6 tablet 0  . benzonatate (TESSALON) 100 MG capsule Take 1-2 capsules (100-200 mg total) by mouth 3 (three) times daily as needed. 60 capsule 0  . cholecalciferol (VITAMIN D3) 25 MCG (1000 UNIT) tablet Take 1,000 Units by mouth daily.    Marland Kitchen DEXILANT 60 MG capsule TAKE 1 CAPSULE(60 MG) BY MOUTH EVERY MORNING 30 capsule 3  . diclofenac Sodium (QC DICLOFENAC SODIUM) 1 % GEL Apply 4  g topically 4 (four) times daily. 350 g 2  . famotidine (PEPCID) 40 MG tablet Take 40 mg by mouth daily.    Marland Kitchen gabapentin (NEURONTIN) 100 MG capsule Take 1 capsule (100 mg total) by mouth at bedtime. 30 capsule 2  . metFORMIN (GLUCOPHAGE) 850 MG tablet TAKE 1 TABLET(850 MG) BY MOUTH TWICE DAILY WITH A MEAL 180 tablet 1  . pantoprazole (PROTONIX) 40 MG tablet Take 40 mg by mouth daily.    . promethazine-dextromethorphan (PROMETHAZINE-DM) 6.25-15 MG/5ML syrup Take 5 mLs by mouth at bedtime as needed for cough. 100 mL 0  . pseudoephedrine (SUDAFED) 60 MG tablet Take 1 tablet (60 mg total) by mouth every 6 (six) hours as needed for congestion. 30 tablet 0  . sucralfate (CARAFATE) 1 GM/10ML suspension     . SUMAtriptan (IMITREX) 50 MG tablet Take 50 mg at the onset of headache; may repeat every 2 hours but no more than 200  mg/24 hours. 10 tablet 1  . triamcinolone (NASACORT) 55 MCG/ACT AERO nasal inhaler Place 2 sprays into the nose daily. 1 Inhaler 12  . XIIDRA 5 % SOLN Place 1 drop into both eyes 2 (two) times daily.     No current facility-administered medications on file prior to visit.    No Known Allergies  Family History  Problem Relation Age of Onset  . Hypertension Mother   . Arthritis Mother   . Diabetes Mother   . Hyperlipidemia Mother   . Arthritis Sister   . Rheum arthritis Brother   . Autoimmune disease Brother   . Lung disease Brother   . Arthritis Sister   . Gout Son   . High Cholesterol Daughter   . Thyroid disease Neg Hx     BP 136/80 (BP Location: Right Arm, Patient Position: Sitting, Cuff Size: Normal)   Pulse 73   Ht 5\' 3"  (1.6 m)   Wt 190 lb (86.2 kg)   SpO2 98%   BMI 33.66 kg/m     Review of Systems denies weight loss and anxiety.    Objective:   Physical Exam VS: see vs page GEN: no distress HEAD: head: no deformity eyes: no periorbital swelling, no proptosis external nose and ears are normal NECK: supple, thyroid is not enlarged CHEST WALL: no deformity LUNGS: clear to auscultation CV: reg rate and rhythm, no murmur.  MUSCULOSKELETAL: gait is normal and steady EXTEMITIES: no deformity.  no leg edema NEURO:  readily moves all 4's.  sensation is intact to touch on all 4's.  No tremor SKIN:  Normal texture and temperature.  No rash or suspicious lesion is visible.  Not diaphoretic.   NODES:  None palpable at the neck.   PSYCH: alert, well-oriented.  Does not appear anxious nor depressed.   outside test results are reviewed: TSH (2021)=1.4 TSH (2022)=0.22   I have reviewed outside records, and summarized: Pt was noted to have low TSH, and referred here.  Main prob addressed was GERD      Assessment & Plan:  Hyperthyroidism, new to me, uncertain etiology and prognosis.   Patient Instructions  Blood tests are requested for you today.  We'll let  you know about the results.  If it is overactive again, I will prescribe for you a pill to slow the thyroid. Please come back for a follow-up appointment in 2 months.    Se solicitan anlisis de sangre para usted hoy. Te informaremos . Si vuelve a estar hiperactiva, le recetar una pastilla para  ralentizar la tiroides. Vuelva para una cita de seguimiento en 2 meses.

## 2021-04-02 ENCOUNTER — Encounter: Payer: Self-pay | Admitting: Endocrinology

## 2021-04-18 ENCOUNTER — Other Ambulatory Visit: Payer: Self-pay | Admitting: Emergency Medicine

## 2021-04-18 DIAGNOSIS — R7303 Prediabetes: Secondary | ICD-10-CM

## 2021-04-19 ENCOUNTER — Ambulatory Visit (INDEPENDENT_AMBULATORY_CARE_PROVIDER_SITE_OTHER): Payer: 59 | Admitting: Specialist

## 2021-04-19 ENCOUNTER — Other Ambulatory Visit: Payer: Self-pay

## 2021-04-19 ENCOUNTER — Encounter: Payer: Self-pay | Admitting: Specialist

## 2021-04-19 VITALS — BP 133/81 | HR 76 | Ht 63.0 in | Wt 190.0 lb

## 2021-04-19 DIAGNOSIS — M4316 Spondylolisthesis, lumbar region: Secondary | ICD-10-CM

## 2021-04-19 DIAGNOSIS — M542 Cervicalgia: Secondary | ICD-10-CM

## 2021-04-19 DIAGNOSIS — G5603 Carpal tunnel syndrome, bilateral upper limbs: Secondary | ICD-10-CM

## 2021-04-19 DIAGNOSIS — M48062 Spinal stenosis, lumbar region with neurogenic claudication: Secondary | ICD-10-CM | POA: Diagnosis not present

## 2021-04-19 DIAGNOSIS — M47812 Spondylosis without myelopathy or radiculopathy, cervical region: Secondary | ICD-10-CM

## 2021-04-19 DIAGNOSIS — M7501 Adhesive capsulitis of right shoulder: Secondary | ICD-10-CM

## 2021-04-19 NOTE — Patient Instructions (Addendum)
Avoid overhead lifting and overhead use of the arms. Pillows to keep from sleeping directly on the shoulders Limited lifting to less than 10 lbs. Ice or heat for relief. NSAIDs are helpful, such as alleve or motrin, be careful not to use in excess as they place burdens on the kidney. Stretching exercise help and strengthening is helpful to build endurance.  Avoid bending, stooping and avoid lifting weights greater than 10 lbs. Avoid prolong standing and walking. Avoid frequent bending and stooping  No lifting greater than 10 lbs. May use ice or moist heat for pain. Weight loss is of benefit. Carpal Tunnel Syndrome  Carpal tunnel syndrome is a condition that causes pain in your hand and arm. The carpal tunnel is a narrow area located on the palm side of your wrist. Repeated wrist motion or certain diseases may cause swelling within the tunnel. This swelling pinches the main nerve in the wrist (median nerve). What are the causes? This condition may be caused by: Repeated wrist motions. Wrist injuries. Arthritis. A cyst or tumor in the carpal tunnel. Fluid buildup during pregnancy. Sometimes the cause of this condition is not known. What increases the risk? This condition is more likely to develop in: People who have jobs that cause them to repeatedly move their wrists in the same motion, such as Health visitor. Women. People with certain conditions, such as: Diabetes. Obesity. An underactive thyroid (hypothyroidism). Kidney failure. What are the signs or symptoms? Symptoms of this condition include: A tingling feeling in your fingers, especially in your thumb, index, and middle fingers. Tingling or numbness in your hand. An aching feeling in your entire arm, especially when your wrist and elbow are bent for long periods of time. Wrist pain that goes up your arm to your shoulder. Pain that goes down into your palm or fingers. A weak feeling in your hands. You may have  trouble grabbing and holding items. Your symptoms may feel worse during the night. How is this diagnosed? This condition is diagnosed with a medical history and physical exam. You may also have tests, including: An electromyogram (EMG). This test measures electrical signals sent by your nerves into the muscles. X-rays. How is this treated? Treatment for this condition includes: Lifestyle changes. It is important to stop doing or modify the activity that caused your condition. Physical or occupational therapy. Medicines for pain and inflammation. This may include medicine that is injected into your wrist. A wrist splint. Surgery. Follow these instructions at home: If you have a splint:  Wear it as told by your health care provider. Remove it only as told by your health care provider. Loosen the splint if your fingers become numb and tingle, or if they turn cold and blue. Keep the splint clean and dry. General instructions  Take over-the-counter and prescription medicines only as told by your health care provider. Rest your wrist from any activity that may be causing your pain. If your condition is work related, talk to your employer about changes that can be made, such as getting a wrist pad to use while typing. If directed, apply ice to the painful area: Put ice in a plastic bag. Place a towel between your skin and the bag. Leave the ice on for 20 minutes, 2-3 times per day. Keep all follow-up visits as told by your health care provider. This is important. Do any exercises as told by your health care provider, physical therapist, or occupational therapist. Contact a health care provider if: You  have new symptoms. Your pain is not controlled with medicines. Your symptoms get worse. This information is not intended to replace advice given to you by your health care provider. Make sure you discuss any questions you have with your health care provider. Vitamin B complex with vitamin B6  and B12 is helpful for improving nerve healing. Splints for the wrists for 2 weeks then just at night as needed.   Document Released: 10/17/2000 Document Revised: 02/28/2016 Document Reviewed: 07/01/2017 Elsevier Interactive Patient Education  2017 [employer].

## 2021-04-19 NOTE — Progress Notes (Signed)
Office Visit Note   Patient: Kendra Rodriguez           Date of Birth: Aug 13, 1966           MRN: [id number] Visit Date: 04/19/2021              Requested by: [name], MD [address] [place],  [place] [address] PCP: [name], MD   Assessment & Plan: Visit Diagnoses:  1. Spondylolisthesis, lumbar region   2. Bilateral carpal tunnel syndrome   3. Spinal stenosis of lumbar region with neurogenic claudication   4. Cervicalgia   5. Adhesive capsulitis of right shoulder   6. Spondylosis without myelopathy or radiculopathy, cervical region     Plan: Avoid overhead lifting and overhead use of the arms. Pillows to keep from sleeping directly on the shoulders Limited lifting to less than 10 lbs. Ice or heat for relief. NSAIDs are helpful, such as alleve or motrin, be careful not to use in excess as they place burdens on the kidney. Stretching exercise help and strengthening is helpful to build endurance.  Avoid bending, stooping and avoid lifting weights greater than 10 lbs. Avoid prolong standing and walking. Avoid frequent bending and stooping  No lifting greater than 10 lbs. May use ice or moist heat for pain. Weight loss is of benefit. Carpal Tunnel Syndrome  Carpal tunnel syndrome is a condition that causes pain in your hand and arm. The carpal tunnel is a narrow area located on the palm side of your wrist. Repeated wrist motion or certain diseases may cause swelling within the tunnel. This swelling pinches the main nerve in the wrist (median nerve). What are the causes? This condition may be caused by: Repeated wrist motions. Wrist injuries. Arthritis. A cyst or tumor in the carpal tunnel. Fluid buildup during pregnancy. Sometimes the cause of this condition is not known. What increases the risk? This condition is more likely to develop in: People who have jobs that cause them to repeatedly move their wrists in the same motion, such as Pension scheme manager. Women. People with certain conditions, such as: Diabetes. Obesity. An underactive thyroid (hypothyroidism). Kidney failure. What are the signs or symptoms? Symptoms of this condition include: A tingling feeling in your fingers, especially in your thumb, index, and middle fingers. Tingling or numbness in your hand. An aching feeling in your entire arm, especially when your wrist and elbow are bent for long periods of time. Wrist pain that goes up your arm to your shoulder. Pain that goes down into your palm or fingers. A weak feeling in your hands. You may have trouble grabbing and holding items. Your symptoms may feel worse during the night. How is this diagnosed? This condition is diagnosed with a medical history and physical exam. You may also have tests, including: An electromyogram (EMG). This test measures electrical signals sent by your nerves into the muscles. X-rays. How is this treated? Treatment for this condition includes: Lifestyle changes. It is important to stop doing or modify the activity that caused your condition. Physical or occupational therapy. Medicines for pain and inflammation. This may include medicine that is injected into your wrist. A wrist splint. Surgery. Follow these instructions at home: If you have a splint:  Wear it as told by your health care provider. Remove it only as told by your health care provider. Loosen the splint if your fingers become numb and tingle, or if they turn cold and blue. Keep the  splint clean and dry. General instructions  Take over-the-counter and prescription medicines only as told by your health care provider. Rest your wrist from any activity that may be causing your pain. If your condition is work related, talk to your employer about changes that can be made, such as getting a wrist pad to use while typing. If directed, apply ice to the painful area: Put ice in a plastic bag. Place a towel between your  skin and the bag. Leave the ice on for 20 minutes, 2-3 times per day. Keep all follow-up visits as told by your health care provider. This is important. Do any exercises as told by your health care provider, physical therapist, or occupational therapist. Contact a health care provider if: You have new symptoms. Your pain is not controlled with medicines. Your symptoms get worse. This information is not intended to replace advice given to you by your health care provider. Make sure you discuss any questions you have with your health care provider. Document Released: 10/17/2000 Document Revised: 02/28/2016 Document Reviewed: 07/01/2017 Elsevier Interactive Patient Education  2017 [employer].   Follow-Up Instructions: No follow-ups on file.   Orders:  No orders of the defined types were placed in this encounter.  No orders of the defined types were placed in this encounter.     Procedures: No procedures performed   Clinical Data: No additional findings.   Subjective: Chief Complaint  Patient presents with   Right Hand - Follow-up    EMG/NCS Review   Left Hand - Follow-up    EMG/NCS Review    55 year old right handed female with history of bilateral arm numbness and tingling. Has history of prediabetes. Underwent recent EMG and NVC for assessment of the nerves in the arms and this demonstrated mild CTS both wrists. She does have neck discomfort and feeling of catching and popping into the neck and she is seeing a therapist at [facility] rehab off [facility].    Review of Systems   Objective: Vital Signs: BP 133/81 (BP Location: Left Arm, Patient Position: Sitting)   Pulse 76   Ht 5\' 3"  (1.6 m)   Wt 190 lb (86.2 kg)   BMI 33.66 kg/m   Physical Exam  Ortho Exam  Specialty Comments:  No specialty comments available.  Imaging: No results found.   PMFS History: Patient Active Problem List   Diagnosis Date Noted   Hyperthyroidism 03/29/2021   Body mass  index (BMI) of 35.0-35.9 in adult 06/04/2020   History of migraine headaches 12/06/2019   Dyslipidemia 06/30/2019   GERD with esophagitis 06/30/2019   Prediabetes 10/17/2015   Spinal stenosis, lumbar region, with neurogenic claudication 10/24/2014   Past Medical History:  Diagnosis Date   Acute sinusitis    Allergy    Anxiety    Depression    NO MEDICATIONS - DOING OK NOW   Dizziness    Ear ache    RIGHT --HX OF MULTIPLE EAR INFECTIONS AS A CHILD    GERD (gastroesophageal reflux disease)    Headache(784.0)    MIGRAINES   Leg pain, left    with swelling   Lumbar stenosis    PAIN BACK AND LEFT LEG AND NUMBNESS LEG AND FOOT   Peripheral vascular disease (HCC)    HX OF TREATMENT FOR VARICOSE VEINS RIGHT LEG   Thyroid disease    TOLD BLOOD LEVELS SLIGHTLY ABNORMAL - BUT NOT REQUIRED MEDICATION    Family History  Problem Relation Age of  Onset   Hypertension Mother    Arthritis Mother    Diabetes Mother    Hyperlipidemia Mother    Arthritis Sister    Rheum arthritis Brother    Autoimmune disease Brother    Lung disease Brother    Arthritis Sister    Gout Son    High Cholesterol Daughter    Thyroid disease Neg Hx     Past Surgical History:  Procedure Laterality Date   ABDOMINAL HYSTERECTOMY     BACK SURGERY     bladder lift     AT SAME TIME OF HYSTERECTOMY   CHOLECYSTECTOMY     LUMBAR LAMINECTOMY/DECOMPRESSION MICRODISCECTOMY Left 10/24/2014   Procedure: COMPLETE LUMBAR DECOMPRESSION L4-L5 CENTRAL AND MICRODISCECTOMY L4-L5 LEFT;  Surgeon: Jacki Cones, MD;  Location: [facility];  Service: Orthopedics;  Laterality: Left;   Social History   Occupational History   Not on file  Tobacco Use   Smoking status: Never   Smokeless tobacco: Never  Vaping Use   Vaping Use: Never used  Substance and Sexual Activity   Alcohol use: No   Drug use: No   Sexual activity: Not on file       Avoid overhead lifting and overhead use of the arms. Pillows to keep from sleeping  directly on the shoulders Limited lifting to less than 10 lbs. Ice or heat for relief. NSAIDs are helpful, such as alleve or motrin, be careful not to use in excess as they place burdens on the kidney. Stretching exercise help and strengthening is helpful to build endurance.  Avoid bending, stooping and avoid lifting weights greater than 10 lbs. Avoid prolong standing and walking. Avoid frequent bending and stooping  No lifting greater than 10 lbs. May use ice or moist heat for pain. Weight loss is of benefit. Carpal Tunnel Syndrome  Carpal tunnel syndrome is a condition that causes pain in your hand and arm. The carpal tunnel is a narrow area located on the palm side of your wrist. Repeated wrist motion or certain diseases may cause swelling within the tunnel. This swelling pinches the main nerve in the wrist (median nerve). What are the causes? This condition may be caused by: Repeated wrist motions. Wrist injuries. Arthritis. A cyst or tumor in the carpal tunnel. Fluid buildup during pregnancy. Sometimes the cause of this condition is not known. What increases the risk? This condition is more likely to develop in: People who have jobs that cause them to repeatedly move their wrists in the same motion, such as Health visitor. Women. People with certain conditions, such as: Diabetes. Obesity. An underactive thyroid (hypothyroidism). Kidney failure. What are the signs or symptoms? Symptoms of this condition include: A tingling feeling in your fingers, especially in your thumb, index, and middle fingers. Tingling or numbness in your hand. An aching feeling in your entire arm, especially when your wrist and elbow are bent for long periods of time. Wrist pain that goes up your arm to your shoulder. Pain that goes down into your palm or fingers. A weak feeling in your hands. You may have trouble grabbing and holding items. Your symptoms may feel worse during the night. How  is this diagnosed? This condition is diagnosed with a medical history and physical exam. You may also have tests, including: An electromyogram (EMG). This test measures electrical signals sent by your nerves into the muscles. X-rays. How is this treated? Treatment for this condition includes: Lifestyle changes. It is important to stop doing or modify  the activity that caused your condition. Physical or occupational therapy. Medicines for pain and inflammation. This may include medicine that is injected into your wrist. A wrist splint. Surgery. Follow these instructions at home: If you have a splint:  Wear it as told by your health care provider. Remove it only as told by your health care provider. Loosen the splint if your fingers become numb and tingle, or if they turn cold and blue. Keep the splint clean and dry. General instructions  Take over-the-counter and prescription medicines only as told by your health care provider. Rest your wrist from any activity that may be causing your pain. If your condition is work related, talk to your employer about changes that can be made, such as getting a wrist pad to use while typing. If directed, apply ice to the painful area: Put ice in a plastic bag. Place a towel between your skin and the bag. Leave the ice on for 20 minutes, 2-3 times per day. Keep all follow-up visits as told by your health care provider. This is important. Do any exercises as told by your health care provider, physical therapist, or occupational therapist. Contact a health care provider if: You have new symptoms. Your pain is not controlled with medicines. Your symptoms get worse. This information is not intended to replace advice given to you by your health care provider. Make sure you discuss any questions you have with your health care provider. Document Released: 10/17/2000 Document Revised: 02/28/2016 Document Reviewed: 07/01/2017 Elsevier Interactive Patient  Education  2017 [employer]

## 2021-05-03 ENCOUNTER — Other Ambulatory Visit: Payer: Self-pay

## 2021-05-03 ENCOUNTER — Encounter (HOSPITAL_COMMUNITY): Payer: Self-pay | Admitting: Emergency Medicine

## 2021-05-03 ENCOUNTER — Ambulatory Visit (HOSPITAL_COMMUNITY)
Admission: EM | Admit: 2021-05-03 | Discharge: 2021-05-03 | Disposition: A | Discharge status: Home / Self Care | Payer: 59 | Attending: Medical Oncology | Admitting: Medical Oncology

## 2021-05-03 DIAGNOSIS — Z7984 Long term (current) use of oral hypoglycemic drugs: Secondary | ICD-10-CM | POA: Insufficient documentation

## 2021-05-03 DIAGNOSIS — J069 Acute upper respiratory infection, unspecified: Secondary | ICD-10-CM

## 2021-05-03 DIAGNOSIS — K219 Gastro-esophageal reflux disease without esophagitis: Secondary | ICD-10-CM | POA: Insufficient documentation

## 2021-05-03 DIAGNOSIS — U071 COVID-19: Secondary | ICD-10-CM | POA: Diagnosis not present

## 2021-05-03 DIAGNOSIS — Z79899 Other long term (current) drug therapy: Secondary | ICD-10-CM | POA: Diagnosis not present

## 2021-05-03 DIAGNOSIS — Z79890 Hormone replacement therapy (postmenopausal): Secondary | ICD-10-CM | POA: Diagnosis not present

## 2021-05-03 DIAGNOSIS — R079 Chest pain, unspecified: Secondary | ICD-10-CM | POA: Diagnosis not present

## 2021-05-03 DIAGNOSIS — J029 Acute pharyngitis, unspecified: Secondary | ICD-10-CM | POA: Diagnosis present

## 2021-05-03 LAB — POCT RAPID STREP A, ED / UC: Streptococcus, Group A Screen (Direct): NEGATIVE

## 2021-05-03 LAB — SARS CORONAVIRUS 2 (TAT 6-24 HRS): SARS Coronavirus 2: POSITIVE — AB

## 2021-05-03 MED ORDER — LIDOCAINE VISCOUS HCL 2 % MT SOLN
OROMUCOSAL | Status: AC
  Filled 2021-05-03: qty 15

## 2021-05-03 MED ORDER — ALUM & MAG HYDROXIDE-SIMETH 200-200-20 MG/5ML PO SUSP
30.0000 mL | Freq: Once | ORAL | Status: AC
  Administered 2021-05-03: 30 mL via ORAL

## 2021-05-03 MED ORDER — LIDOCAINE VISCOUS HCL 2 % MT SOLN
15.0000 mL | Freq: Once | OROMUCOSAL | Status: AC
  Administered 2021-05-03: 15 mL via ORAL

## 2021-05-03 MED ORDER — BENZONATATE 100 MG PO CAPS: 100.0000 mg | ORAL_CAPSULE | Freq: Three times a day (TID) | ORAL | 0 refills | Status: DC

## 2021-05-03 MED ORDER — FLUTICASONE PROPIONATE 50 MCG/ACT NA SUSP: 2.0000 | Freq: Every day | NASAL | 0 refills | Status: DC

## 2021-05-03 MED ORDER — ALUM & MAG HYDROXIDE-SIMETH 200-200-20 MG/5ML PO SUSP
ORAL | Status: AC
  Filled 2021-05-03: qty 30

## 2021-05-03 NOTE — ED Provider Notes (Signed)
[facility]    CSN: [id number] Arrival date & time: 05/03/21  0815      History   Chief Complaint Chief Complaint  Patient presents with   Sore Throat   Headache    HPI Kendra Rodriguez is a 55 y.o. female. Spanish interpretor Maureen # [phone]  HPI  Cold Symptoms: Patient reports that she has had symptoms of sore throat, headache, chest pain, runny nose, chills that started yesterday. She states that the chest pain she is having feels similar to her GERD symptoms but is unsure- it started around 8 am.  She is not having any shortness of breath.  She does have a fever in office today but did not know of any at home.  She has no history of COPD or asthma.  No known sick contacts.  She has tried allergy medication for symptoms without much relief.   Past Medical History:  Diagnosis Date   Acute sinusitis    Allergy    Anxiety    Depression    NO MEDICATIONS - DOING OK NOW   Dizziness    Ear ache    RIGHT --HX OF MULTIPLE EAR INFECTIONS AS A CHILD    GERD (gastroesophageal reflux disease)    Headache(784.0)    MIGRAINES   Leg pain, left    with swelling   Lumbar stenosis    PAIN BACK AND LEFT LEG AND NUMBNESS LEG AND FOOT   Peripheral vascular disease (HCC)    HX OF TREATMENT FOR VARICOSE VEINS RIGHT LEG   Thyroid disease    TOLD BLOOD LEVELS SLIGHTLY ABNORMAL - BUT NOT REQUIRED MEDICATION    Patient Active Problem List   Diagnosis Date Noted   Hyperthyroidism 03/29/2021   Body mass index (BMI) of 35.0-35.9 in adult 06/04/2020   History of migraine headaches 12/06/2019   Dyslipidemia 06/30/2019   GERD with esophagitis 06/30/2019   Prediabetes 10/17/2015   Spinal stenosis, lumbar region, with neurogenic claudication 10/24/2014    Past Surgical History:  Procedure Laterality Date   ABDOMINAL HYSTERECTOMY     BACK SURGERY     bladder lift     AT SAME TIME OF HYSTERECTOMY   CHOLECYSTECTOMY     LUMBAR LAMINECTOMY/DECOMPRESSION MICRODISCECTOMY Left  10/24/2014   Procedure: COMPLETE LUMBAR DECOMPRESSION L4-L5 CENTRAL AND MICRODISCECTOMY L4-L5 LEFT;  Surgeon: Jacki Cones, MD;  Location: [facility];  Service: Orthopedics;  Laterality: Left;    OB History   No obstetric history on file.      Home Medications    Prior to Admission medications   Medication Sig Start Date End Date Taking? Authorizing Provider  atorvastatin (LIPITOR) 40 MG tablet TAKE 1 TABLET(40 MG) BY MOUTH DAILY 02/07/21  Yes Sagardia, Eilleen Kempf, MD  levothyroxine (SYNTHROID) 50 MCG tablet Take 1 tablet (50 mcg total) by mouth daily. 03/29/21  Yes Romero Belling, MD  metFORMIN (GLUCOPHAGE) 850 MG tablet TAKE 1 TABLET(850 MG) BY MOUTH TWICE DAILY WITH A MEAL 04/18/21  Yes Sagardia, Eilleen Kempf, MD  pantoprazole (PROTONIX) 40 MG tablet Take 40 mg by mouth daily.   Yes [provider]  azithromycin (ZITHROMAX) 250 MG tablet Sig as indicated 06/26/20   [name], MD  benzonatate (TESSALON) 100 MG capsule Take 1-2 capsules (100-200 mg total) by mouth 3 (three) times daily as needed. 06/30/20   [name], PA-C  cholecalciferol (VITAMIN D3) 25 MCG (1000 UNIT) tablet Take 1,000 Units by mouth daily.    [provider]  DEXILANT 60 MG capsule TAKE 1 CAPSULE(60 MG) BY MOUTH EVERY MORNING 10/05/19   [name], MD  diclofenac Sodium (QC DICLOFENAC SODIUM) 1 % GEL Apply 4 g topically 4 (four) times daily. 01/02/21   Kerrin Champagne, MD  famotidine (PEPCID) 40 MG tablet Take 40 mg by mouth daily.    [provider]  gabapentin (NEURONTIN) 100 MG capsule Take 1 capsule (100 mg total) by mouth at bedtime. 01/02/21   Kerrin Champagne, MD  promethazine-dextromethorphan (PROMETHAZINE-DM) 6.25-15 MG/5ML syrup Take 5 mLs by mouth at bedtime as needed for cough. 06/30/20   [name], PA-C  pseudoephedrine (SUDAFED) 60 MG tablet Take 1 tablet (60 mg total) by mouth every 6 (six) hours as needed for congestion. 06/30/20   [name], PA-C  sucralfate  (CARAFATE) 1 GM/10ML suspension  01/06/20   [provider]  SUMAtriptan (IMITREX) 50 MG tablet Take 50 mg at the onset of headache; may repeat every 2 hours but no more than 200 mg/24 hours. 06/04/20   [name], MD  triamcinolone (NASACORT) 55 MCG/ACT AERO nasal inhaler Place 2 sprays into the nose daily. 03/02/20   Lezlie Lye, Meda Coffee, MD  XIIDRA 5 % SOLN Place 1 drop into both eyes 2 (two) times daily. 03/23/20   [provider]    Family History Family History  Problem Relation Age of Onset   Hypertension Mother    Arthritis Mother    Diabetes Mother    Hyperlipidemia Mother    Arthritis Sister    Rheum arthritis Brother    Autoimmune disease Brother    Lung disease Brother    Arthritis Sister    Gout Son    High Cholesterol Daughter    Thyroid disease Neg Hx     Social History Social History   Tobacco Use   Smoking status: Never   Smokeless tobacco: Never  Vaping Use   Vaping Use: Never used  Substance Use Topics   Alcohol use: No   Drug use: No     Allergies   Patient has no known allergies.   Review of Systems Review of Systems  As stated above in HPI Physical Exam Triage Vital Signs ED Triage Vitals  Enc Vitals Group     BP 05/03/21 0908 132/70     Pulse Rate 05/03/21 0908 87     Resp 05/03/21 0908 16     Temp 05/03/21 0908 100 F (37.8 C)     Temp Source 05/03/21 0908 Oral     SpO2 05/03/21 0908 97 %     Weight --      Height --      Head Circumference --      Peak Flow --      Pain Score 05/03/21 0909 6     Pain Loc --      Pain Edu? --      Excl. in GC? --    No data found.  Updated Vital Signs BP 132/70   Pulse 87   Temp 100 F (37.8 C) (Oral)   Resp 16   SpO2 97%    Physical Exam Vitals and nursing note reviewed.  Constitutional:      Appearance: She is well-developed.  HENT:     Head: Normocephalic and atraumatic.     Right Ear: Tympanic membrane normal. No tenderness. No middle ear effusion.  Tympanic membrane is not erythematous.     Left Ear: Tympanic membrane normal. No tenderness.  No middle ear effusion. Tympanic membrane is not erythematous.     Nose: Congestion and rhinorrhea present.     Mouth/Throat:     Mouth: Mucous membranes are dry.     Pharynx: Oropharynx is clear. Uvula midline. No oropharyngeal exudate, posterior oropharyngeal erythema or uvula swelling.     Tonsils: No tonsillar exudate or tonsillar abscesses.  Eyes:     Conjunctiva/sclera: Conjunctivae normal.     Pupils: Pupils are equal, round, and reactive to light.  Cardiovascular:     Rate and Rhythm: Normal rate and regular rhythm.     Heart sounds: Normal heart sounds.  Pulmonary:     Effort: Pulmonary effort is normal.     Breath sounds: Normal breath sounds.  Musculoskeletal:     Cervical back: Normal range of motion and neck supple.  Lymphadenopathy:     Cervical: No cervical adenopathy.  Skin:    General: Skin is warm.  Neurological:     Mental Status: She is alert and oriented to person, place, and time.     UC Treatments / Results  Labs (all labs ordered are listed, but only abnormal results are displayed) Labs Reviewed  SARS CORONAVIRUS 2 (TAT 6-24 HRS)  CULTURE, GROUP A STREP [facility])  POCT RAPID STREP A, ED / UC    EKG   Radiology No results found.  Procedures Procedures (including critical care time)  Medications Ordered in UC Medications  alum & mag hydroxide-simeth (MAALOX/MYLANTA) 200-200-20 MG/5ML suspension 30 mL (30 mLs Oral Given 05/03/21 1006)    And  lidocaine (XYLOCAINE) 2 % viscous mouth solution 15 mL (15 mLs Oral Given 05/03/21 1007)    Initial Impression / Assessment and Plan / UC Course  I have reviewed the triage vital signs and the nursing notes.  Pertinent labs & imaging results that were available during my care of the patient were reviewed by me and considered in my medical decision making (see chart for details).     New.  Appears viral in nature  however we are going to trial a GI cocktail and obtain an EKG to make sure that there is no concern with her chest pain that we are missing. UPDATE: Chest pain greatly improved with GI cocktail. Sending in flonase and tessalon should cough start. COVID-19 test pending. Discussed red flag signs and symptoms Final Clinical Impressions(s) / UC Diagnoses   Final diagnoses:  Acute upper respiratory infection  Chest pain, unspecified type   Discharge Instructions   None    ED Prescriptions   None    PDMP not reviewed this encounter.   Rushie Chestnut, [place] 05/03/21 1033

## 2021-05-03 NOTE — ED Triage Notes (Signed)
PT reports chills, headache, sore throat congestion started yesterday.  Denies cough and shortness of breath.

## 2021-05-05 LAB — CULTURE, GROUP A STREP (THRC)

## 2021-05-08 ENCOUNTER — Encounter: Payer: Self-pay | Admitting: Emergency Medicine

## 2021-05-11 ENCOUNTER — Other Ambulatory Visit: Payer: Self-pay

## 2021-05-11 ENCOUNTER — Ambulatory Visit (HOSPITAL_COMMUNITY)
Admission: EM | Admit: 2021-05-11 | Discharge: 2021-05-11 | Disposition: A | Discharge status: Home / Self Care | Payer: 59 | Attending: Urgent Care | Admitting: Urgent Care

## 2021-05-11 ENCOUNTER — Encounter (HOSPITAL_COMMUNITY): Payer: Self-pay | Admitting: *Deleted

## 2021-05-11 ENCOUNTER — Other Ambulatory Visit: Payer: Self-pay | Admitting: Emergency Medicine

## 2021-05-11 DIAGNOSIS — Z8616 Personal history of COVID-19: Secondary | ICD-10-CM

## 2021-05-11 DIAGNOSIS — M546 Pain in thoracic spine: Secondary | ICD-10-CM | POA: Diagnosis not present

## 2021-05-11 DIAGNOSIS — R059 Cough, unspecified: Secondary | ICD-10-CM

## 2021-05-11 DIAGNOSIS — J018 Other acute sinusitis: Secondary | ICD-10-CM | POA: Diagnosis not present

## 2021-05-11 MED ORDER — CETIRIZINE HCL 10 MG PO TABS: 10.0000 mg | ORAL_TABLET | Freq: Every day | ORAL | 0 refills | Status: DC

## 2021-05-11 MED ORDER — GUAIFENESIN-CODEINE 100-10 MG/5ML PO SYRP: 5.0000 mL | ORAL_SOLUTION | Freq: Three times a day (TID) | ORAL | 0 refills | Status: DC | PRN

## 2021-05-11 MED ORDER — BENZONATATE 100 MG PO CAPS: 100.0000 mg | ORAL_CAPSULE | Freq: Three times a day (TID) | ORAL | 0 refills | Status: DC

## 2021-05-11 MED ORDER — AMOXICILLIN 875 MG PO TABS: 875.0000 mg | ORAL_TABLET | Freq: Two times a day (BID) | ORAL | 0 refills | Status: DC

## 2021-05-11 MED ORDER — PROMETHAZINE-DM 6.25-15 MG/5ML PO SYRP: 5.0000 mL | ORAL_SOLUTION | Freq: Every evening | ORAL | 0 refills | Status: DC | PRN

## 2021-05-11 NOTE — ED Triage Notes (Signed)
Pt reports she still has a cough with back pain . Pt tested positive for COVID 9 days ago

## 2021-05-11 NOTE — ED Provider Notes (Signed)
[facility]   MRN: [id number] DOB: [birth year]  Subjective:   Kendra Rodriguez is a 55 y.o. female presenting for 9-day history of persistent and worsening sinus pressure, sinus congestion, cough and throat pain.  Patient was diagnosed with COVID-19 on 05/03/2021.  She has been using supportive care but has not gotten any kind of relief.  Reports that she has now having back pain from her cough and eye pressure from her sinus congestion.  Patient is not a smoker.  She does have a history of allergic rhinitis but is not taking anything consistently for this.  No history of asthma or respiratory disorders.  No current facility-administered medications for this encounter.  Current Outpatient Medications:    atorvastatin (LIPITOR) 40 MG tablet, TAKE 1 TABLET(40 MG) BY MOUTH DAILY, Disp: 90 tablet, Rfl: 3   azithromycin (ZITHROMAX) 250 MG tablet, Sig as indicated, Disp: 6 tablet, Rfl: 0   benzonatate (TESSALON) 100 MG capsule, Take 1 capsule (100 mg total) by mouth every 8 (eight) hours., Disp: 21 capsule, Rfl: 0   cholecalciferol (VITAMIN D3) 25 MCG (1000 UNIT) tablet, Take 1,000 Units by mouth daily., Disp: , Rfl:    DEXILANT 60 MG capsule, TAKE 1 CAPSULE(60 MG) BY MOUTH EVERY MORNING, Disp: 30 capsule, Rfl: 3   diclofenac Sodium (QC DICLOFENAC SODIUM) 1 % GEL, Apply 4 g topically 4 (four) times daily., Disp: 350 g, Rfl: 2   famotidine (PEPCID) 40 MG tablet, Take 40 mg by mouth daily., Disp: , Rfl:    fluticasone (FLONASE) 50 MCG/ACT nasal spray, Place 2 sprays into both nostrils daily., Disp: 16 mL, Rfl: 0   gabapentin (NEURONTIN) 100 MG capsule, Take 1 capsule (100 mg total) by mouth at bedtime., Disp: 30 capsule, Rfl: 2   guaiFENesin-codeine (ROBITUSSIN AC) 100-10 MG/5ML syrup, Take 5 mLs by mouth 3 (three) times daily as needed for cough., Disp: 120 mL, Rfl: 0   levothyroxine (SYNTHROID) 50 MCG tablet, Take 1 tablet (50 mcg total) by mouth daily., Disp: 90 tablet, Rfl: 3    metFORMIN (GLUCOPHAGE) 850 MG tablet, TAKE 1 TABLET(850 MG) BY MOUTH TWICE DAILY WITH A MEAL, Disp: 180 tablet, Rfl: 1   pantoprazole (PROTONIX) 40 MG tablet, Take 40 mg by mouth daily., Disp: , Rfl:    pseudoephedrine (SUDAFED) 60 MG tablet, Take 1 tablet (60 mg total) by mouth every 6 (six) hours as needed for congestion., Disp: 30 tablet, Rfl: 0   sucralfate (CARAFATE) 1 GM/10ML suspension, , Disp: , Rfl:    SUMAtriptan (IMITREX) 50 MG tablet, Take 50 mg at the onset of headache; may repeat every 2 hours but no more than 200 mg/24 hours., Disp: 10 tablet, Rfl: 1   triamcinolone (NASACORT) 55 MCG/ACT AERO nasal inhaler, Place 2 sprays into the nose daily., Disp: 1 Inhaler, Rfl: 12   XIIDRA 5 % SOLN, Place 1 drop into both eyes 2 (two) times daily., Disp: , Rfl:    No Known Allergies  Past Medical History:  Diagnosis Date   Acute sinusitis    Allergy    Anxiety    Depression    NO MEDICATIONS - DOING OK NOW   Dizziness    Ear ache    RIGHT --HX OF MULTIPLE EAR INFECTIONS AS A CHILD    GERD (gastroesophageal reflux disease)    Headache(784.0)    MIGRAINES   Leg pain, left    with swelling   Lumbar stenosis    PAIN BACK AND LEFT LEG  AND NUMBNESS LEG AND FOOT   Peripheral vascular disease (HCC)    HX OF TREATMENT FOR VARICOSE VEINS RIGHT LEG   Thyroid disease    TOLD BLOOD LEVELS SLIGHTLY ABNORMAL - BUT NOT REQUIRED MEDICATION     Past Surgical History:  Procedure Laterality Date   ABDOMINAL HYSTERECTOMY     BACK SURGERY     bladder lift     AT SAME TIME OF HYSTERECTOMY   CHOLECYSTECTOMY     LUMBAR LAMINECTOMY/DECOMPRESSION MICRODISCECTOMY Left 10/24/2014   Procedure: COMPLETE LUMBAR DECOMPRESSION L4-L5 CENTRAL AND MICRODISCECTOMY L4-L5 LEFT;  Surgeon: Jacki Cones, MD;  Location: [facility];  Service: Orthopedics;  Laterality: Left;    Family History  Problem Relation Age of Onset   Hypertension Mother    Arthritis Mother    Diabetes Mother    Hyperlipidemia Mother     Arthritis Sister    Rheum arthritis Brother    Autoimmune disease Brother    Lung disease Brother    Arthritis Sister    Gout Son    High Cholesterol Daughter    Thyroid disease Neg Hx     Social History   Tobacco Use   Smoking status: Never   Smokeless tobacco: Never  Vaping Use   Vaping Use: Never used  Substance Use Topics   Alcohol use: No   Drug use: No    ROS   Objective:   Vitals: BP 128/81   Pulse (!) 107   Temp 99 F (37.2 C)   Resp 20   SpO2 100%   Pulse recheck range from 90 to 96 bpm.  Physical Exam Constitutional:      General: She is not in acute distress.    Appearance: Normal appearance. She is well-developed. She is not ill-appearing, toxic-appearing or diaphoretic.  HENT:     Head: Normocephalic and atraumatic.     Right Ear: Tympanic membrane and ear canal normal. No drainage or tenderness. No middle ear effusion. Tympanic membrane is not erythematous.     Left Ear: Tympanic membrane and ear canal normal. No drainage or tenderness.  No middle ear effusion. Tympanic membrane is not erythematous.     Nose: Congestion and rhinorrhea present.     Mouth/Throat:     Mouth: Mucous membranes are moist. No oral lesions.     Pharynx: No pharyngeal swelling, oropharyngeal exudate, posterior oropharyngeal erythema or uvula swelling.     Tonsils: No tonsillar exudate or tonsillar abscesses.     Comments: Thick postnasal drainage overlying pharynx. Eyes:     Extraocular Movements: Extraocular movements intact.     Right eye: Normal extraocular motion.     Left eye: Normal extraocular motion.     Conjunctiva/sclera: Conjunctivae normal.     Pupils: Pupils are equal, round, and reactive to light.  Cardiovascular:     Rate and Rhythm: Normal rate and regular rhythm.     Pulses: Normal pulses.     Heart sounds: Normal heart sounds. No murmur heard.   No friction rub. No gallop.  Pulmonary:     Effort: Pulmonary effort is normal. No respiratory distress.      Breath sounds: Normal breath sounds. No stridor. No wheezing, rhonchi or rales.  Musculoskeletal:     Cervical back: Normal range of motion and neck supple.  Lymphadenopathy:     Cervical: No cervical adenopathy.  Skin:    General: Skin is warm and dry.     Findings: No rash.  Neurological:  General: No focal deficit present.     Mental Status: She is alert and oriented to person, place, and time.  Psychiatric:        Mood and Affect: Mood normal.        Behavior: Behavior normal.        Thought Content: Thought content normal.    Assessment and Plan :   PDMP not reviewed this encounter.  1. Acute non-recurrent sinusitis of other sinus   2. History of COVID-19   3. Cough   4. Acute bilateral thoracic back pain     As patient has a clear cardiopulmonary exam, deferred her chest x-ray.  Low suspicion for pulmonary embolism given lack of shortness of breath and chest pain.  Recommended addressing her symptoms for a sinus infection secondary to her COVID-19.  Is appropriate to start amoxicillin now as she has 9 days of worsening sinus symptoms and a cough.  You supportive care otherwise. Counseled patient on potential for adverse effects with medications prescribed/recommended today, ER and return-to-clinic precautions discussed, patient verbalized understanding.    [name], [place] 05/11/21 1843

## 2021-05-13 ENCOUNTER — Other Ambulatory Visit: Payer: Self-pay | Admitting: Emergency Medicine

## 2021-05-13 MED ORDER — GUAIFENESIN-CODEINE 100-10 MG/5ML PO SYRP: 5.0000 mL | ORAL_SOLUTION | Freq: Three times a day (TID) | ORAL | 0 refills | Status: DC | PRN

## 2021-05-15 ENCOUNTER — Ambulatory Visit: Payer: 59 | Admitting: Specialist

## 2021-05-16 ENCOUNTER — Ambulatory Visit: Payer: 59 | Admitting: Physical Therapy

## 2021-05-27 ENCOUNTER — Ambulatory Visit: Payer: 59 | Admitting: Physical Therapy

## 2021-05-28 ENCOUNTER — Other Ambulatory Visit: Payer: Self-pay

## 2021-05-28 ENCOUNTER — Ambulatory Visit (INDEPENDENT_AMBULATORY_CARE_PROVIDER_SITE_OTHER): Payer: 59 | Admitting: Endocrinology

## 2021-05-28 VITALS — BP 140/84 | HR 84 | Ht 63.0 in | Wt 187.6 lb

## 2021-05-28 DIAGNOSIS — E0590 Thyrotoxicosis, unspecified without thyrotoxic crisis or storm: Secondary | ICD-10-CM

## 2021-05-28 MED ORDER — METFORMIN HCL ER 500 MG PO TB24: 1500.0000 mg | ORAL_TABLET | Freq: Every day | ORAL | 3 refills | Status: DC

## 2021-05-28 NOTE — Progress Notes (Signed)
Subjective:    Patient ID: Kendra Rodriguez, female    DOB: 03-14-1966, 55 y.o.   MRN: [id number]  HPI Pt returns for f/u of subacute thyroiditis (dx'ed 2022, when she presented with hyperthyroidism; she spontaneously developed hypothyroidism, and was rx'ed synthroid since rx; she has never had thyroid imaging).   Since on synthroid, she reports ongoing fatigue.  She says this might be due to recent COVID infection Past Medical History:  Diagnosis Date   Acute sinusitis    Allergy    Anxiety    Depression    NO MEDICATIONS - DOING OK NOW   Dizziness    Ear ache    RIGHT --HX OF MULTIPLE EAR INFECTIONS AS A CHILD    GERD (gastroesophageal reflux disease)    Headache(784.0)    MIGRAINES   Leg pain, left    with swelling   Lumbar stenosis    PAIN BACK AND LEFT LEG AND NUMBNESS LEG AND FOOT   Peripheral vascular disease (HCC)    HX OF TREATMENT FOR VARICOSE VEINS RIGHT LEG   Thyroid disease    TOLD BLOOD LEVELS SLIGHTLY ABNORMAL - BUT NOT REQUIRED MEDICATION    Past Surgical History:  Procedure Laterality Date   ABDOMINAL HYSTERECTOMY     BACK SURGERY     bladder lift     AT SAME TIME OF HYSTERECTOMY   CHOLECYSTECTOMY     LUMBAR LAMINECTOMY/DECOMPRESSION MICRODISCECTOMY Left 10/24/2014   Procedure: COMPLETE LUMBAR DECOMPRESSION L4-L5 CENTRAL AND MICRODISCECTOMY L4-L5 LEFT;  Surgeon: Jacki Cones, MD;  Location: [facility];  Service: Orthopedics;  Laterality: Left;    Social History   Socioeconomic History   Marital status: Married    Spouse name: Not on file   Number of children: Not on file   Years of education: Not on file   Highest education level: Not on file  Occupational History   Not on file  Tobacco Use   Smoking status: Never   Smokeless tobacco: Never  Vaping Use   Vaping Use: Never used  Substance and Sexual Activity   Alcohol use: No   Drug use: No   Sexual activity: Not on file  Other Topics Concern   Not on file  Social History Narrative   Not on  file   Social Determinants of Health   Financial Resource Strain: Not on file  Food Insecurity: Not on file  Transportation Needs: Not on file  Physical Activity: Not on file  Stress: Not on file  Social Connections: Not on file  Intimate Partner Violence: Not on file    Current Outpatient Medications on File Prior to Visit  Medication Sig Dispense Refill   atorvastatin (LIPITOR) 40 MG tablet TAKE 1 TABLET(40 MG) BY MOUTH DAILY 90 tablet 3   cetirizine (ZYRTEC ALLERGY) 10 MG tablet Take 1 tablet (10 mg total) by mouth daily. 30 tablet 0   cholecalciferol (VITAMIN D3) 25 MCG (1000 UNIT) tablet Take 1,000 Units by mouth daily.     diclofenac Sodium (QC DICLOFENAC SODIUM) 1 % GEL Apply 4 g topically 4 (four) times daily. 350 g 2   famotidine (PEPCID) 40 MG tablet Take 40 mg by mouth daily.     fluticasone (FLONASE) 50 MCG/ACT nasal spray Place 2 sprays into both nostrils daily. 16 mL 0   gabapentin (NEURONTIN) 100 MG capsule Take 1 capsule (100 mg total) by mouth at bedtime. 30 capsule 2   levothyroxine (SYNTHROID) 50 MCG tablet Take 1 tablet (50 mcg  total) by mouth daily. 90 tablet 3   pantoprazole (PROTONIX) 40 MG tablet Take 40 mg by mouth daily.     sucralfate (CARAFATE) 1 GM/10ML suspension      SUMAtriptan (IMITREX) 50 MG tablet Take 50 mg at the onset of headache; may repeat every 2 hours but no more than 200 mg/24 hours. 10 tablet 1   triamcinolone (NASACORT) 55 MCG/ACT AERO nasal inhaler Place 2 sprays into the nose daily. 1 Inhaler 12   XIIDRA 5 % SOLN Place 1 drop into both eyes 2 (two) times daily.     No current facility-administered medications on file prior to visit.    No Known Allergies  Family History  Problem Relation Age of Onset   Hypertension Mother    Arthritis Mother    Diabetes Mother    Hyperlipidemia Mother    Arthritis Sister    Rheum arthritis Brother    Autoimmune disease Brother    Lung disease Brother    Arthritis Sister    Gout Son    High  Cholesterol Daughter    Thyroid disease Neg Hx     BP 140/84 (BP Location: Right Arm, Patient Position: Sitting, Cuff Size: Normal)   Pulse 84   Ht 5\' 3"  (1.6 m)   Wt 187 lb 9.6 oz (85.1 kg)   SpO2 96%   BMI 33.23 kg/m    Review of Systems She also reports heartburn.      Objective:   Physical Exam NECK: thyroid is slightly and diffusely enlarged.     Lab Results  Component Value Date   TSH 0.67 05/28/2021      Assessment & Plan:  Hyperthyroidism: well-controlled. Please continue the same synthroid.

## 2021-05-28 NOTE — Patient Instructions (Addendum)
Blood tests are requested for you today.  We'll let you know about the results.  I have sent a prescription to your pharmacy, to change the metformin to extended-release, to see if this helps your stomach symptoms.  Please come back for a follow-up appointment in 3 months.    Se solicitan anlisis de sangre para usted hoy. Te informaremos [employer]. He enviado una receta a su farmacia para cambiar la metformina a liberacin prolongada, para ver si esto ayuda con los sntomas estomacales. Vuelva para una cita de seguimiento en 3 meses.

## 2021-05-29 ENCOUNTER — Encounter: Payer: Self-pay | Admitting: Endocrinology

## 2021-05-29 LAB — TSH: TSH: 0.67 u[IU]/mL (ref 0.35–5.50)

## 2021-05-29 LAB — T4, FREE: Free T4: 1.06 ng/dL (ref 0.60–1.60)

## 2021-06-03 ENCOUNTER — Other Ambulatory Visit: Payer: Self-pay | Admitting: Gastroenterology

## 2021-06-04 ENCOUNTER — Encounter: Payer: Self-pay | Admitting: Emergency Medicine

## 2021-06-04 ENCOUNTER — Other Ambulatory Visit: Payer: Self-pay

## 2021-06-04 ENCOUNTER — Ambulatory Visit (INDEPENDENT_AMBULATORY_CARE_PROVIDER_SITE_OTHER): Payer: 59 | Admitting: Emergency Medicine

## 2021-06-04 VITALS — BP 122/70 | HR 81 | Temp 98.3°F | Ht 63.0 in | Wt 188.0 lb

## 2021-06-04 DIAGNOSIS — U099 Post covid-19 condition, unspecified: Secondary | ICD-10-CM | POA: Insufficient documentation

## 2021-06-04 NOTE — Patient Instructions (Signed)
Enfermedades virales en los adultos Viral Illness, Adult [place] virus son microbios diminutos que entran en el organismo de Kendra Rodriguez persona y causan enfermedades. Hay muchos tipos de virus diferentes y causan muchas clases de enfermedades. Las enfermedades virales pueden ser leves o graves.Pueden afectar diferentes partes del cuerpo. Entre las afecciones a corto plazo causadas por virus, se incluyen los resfros y Emergency planning/management officer. Entre las afecciones a largo plazo causadas por un virus, incluyen el herpes, la culebrilla y la infeccin por VIH (virus de inmunodeficiencia humana). Se han identificado unos pocos virus asociados con determinados Tax adviser. Cules son las causas? Muchos tipos de virus pueden causar enfermedades. Los virus invaden las clulas del [place], se multiplican y [employer] las clulas infectadas funcionen de manera anormal o [place]. Cuando estas clulas mueren, liberan ms virus. Cuando esto ocurre, aparecen sntomas de la enfermedad, y el virus sigue diseminndose a otras clulas. Si el virus asume la funcin de la clula, puede hacer que esta se divida y prolifere de Psychologist, occupational. Esto ocurrecuando un virus Customer service manager. Los diferentes virus ingresan al organismo de [employer]. Puede contraer un virus de la siguiente [place]: Al ingerir alimentos o beber agua que han entrado en contacto con el virus (estn contaminados). Al inhalar gotitas que una persona infectada liber en el aire al toser o estornudar. Al tocar una superficie contaminada con el virus y [facility] mano a la boca, la nariz o los ojos. Al ser picado por un insecto o mordido por un animal que son portadores del virus. Al tener contacto sexual con Kendra Rodriguez persona infectada por el virus. Al tener contacto con sangre o lquidos que contienen el virus, ya sea a travs de un corte abierto o durante una transfusin. Si el virus ingresa al organismo, el sistema de defensa del cuerpo (sistema inmunitario)  Product/process development scientist. Puede correr un riesgo ms alto de tener una enfermedadviral si tiene el sistema inmunitario debilitado. Cules son los signos o sntomas? Puede tener los siguientes sntomas, dependiendo del tipo de virus y de la ubicacin de las clulas que invade: Virus del resfro y de la gripe: Grant Ruts. Dolor de [place]. Dolor de Advertising copywriter. Dolores musculares. Nariz tapada (congestin nasal). Tos. Virus del aparato digestivo (gastrointestinales): Grant Ruts. Dolor en el abdomen. Nuseas. Diarrea. Virus hepticos (hepatitis): Prdida del apetito. Cansancio. La piel o las partes blancas de los ojos se ponen amarillas (ictericia). Virus del cerebro y la mdula espinal: Grant Ruts. Dolor de [place]. Rigidez en el cuello. Nuseas y vmitos. Confusin o somnolencia. Virus de la piel: Verrugas. Picazn. Erupcin cutnea. Virus de transmisin sexual: Secrecin. Hinchazn. Enrojecimiento. Erupcin cutnea. Cmo se diagnostica? Esta afeccin se puede diagnosticar en funcin de lo siguiente: Sntomas. Antecedentes mdicos. Examen fsico. Anlisis de [place], [place] de mucosidad de los pulmones (muestra de esputo), [place] de heces o un hisopado de lquidos corporales o una llaga de la piel (lesin). Cmo se trata? Los virus pueden ser difciles de tratar porque se hospedan en las clulas. Los antibiticos no tratan los virus porque estos medicamentos no llegan al interior de las clulas. El tratamiento de una enfermedad viral puede incluir lo siguiente: Lawyer y beber abundantes lquidos. Medicamentos para [employer]. Estos pueden incluir medicamentos de venta libre para Chief Technology Officer y la [place], medicamentos para la tos o la congestin y medicamentos para Actuary. Medicamentos antivirales. Estos medicamentos estn disponibles nicamente para ciertos tipos de virus. Algunas enfermedades virales pueden evitarse con vacunas. Un ejemplo frecuentees la vacuna  antigripal.  Siga estas instrucciones en su casa: Medicamentos Use los medicamentos de venta libre y los recetados solamente como se lo haya indicado el mdico. Si le recetaron un medicamento antiviral, tmelo como se lo haya indicado el mdico. No deje de tomar el antiviral aunque comience a sentirse mejor. Infrmese sobre cundo los antibiticos son necesarios y cundo no. Los antibiticos no combaten a los virus. Si tiene una infeccin viral y el mdico cree que tambin tiene una infeccin [place], o que est en riesgo de [place], tal vez le recete un antibitico. No solicite una receta para antibiticos si le han diagnosticado una enfermedad viral. Los antibiticos no harn que se cure ms rpidamente. Tomar antibiticos con frecuencia cuando no son necesarios puede derivar en resistencia a los antibiticos. Cuando esto ocurre, el medicamento pierde su eficacia contra las bacterias que normalmente combate. Indicaciones generales  Beba suficiente lquido para mantener la orina de color amarillo plido. Descanse todo lo que pueda. Retome sus actividades normales segn lo indicado por el mdico. Pregntele al mdico qu actividades son seguras para usted. Concurra a todas las visitas de [address] se lo haya indicado el mdico. Esto es importante.  Cmo se previene? Para reducir el riesgo de tener una enfermedad viral: Lvese las manos frecuentemente con agua y jabn durante al menos 20 segundos. Use desinfectante para manos si no dispone de [place] y [place]. Evite tocarse la nariz, los ojos y la boca, sobre todo si no se lav las manos recientemente. Si un miembro de la familia tiene una infeccin viral, limpie todas las superficies de la casa que puedan haber estado en contacto con el virus. Use agua caliente y [place]. Tambin puede usar leja con agua agregada (diluido). Mantngase lejos de las personas enfermas con sntomas de una infeccin viral. No comparta objetos tales como  cepillos de dientes y botellas de agua con Economist. Mantenga las vacunas al da. Esto incluye recibir la vacuna antigripal todos los [place]. Siga una dieta saludable y descanse lo suficiente. Comunquese con un mdico si: Tiene sntomas de una enfermedad viral que no desaparecen. Los sntomas regresan despus de haber desaparecido. Sus sntomas empeoran. Solicite ayuda de inmediato si tiene: Dificultad para Industrial/product designer. Dolor de cabeza intenso o rigidez en el cuello. Vmitos abundantes o dolor en el abdomen. Estos sntomas pueden representar un problema grave que constituye Radio broadcast assistant. No espere a ver si los sntomas desaparecen. Solicite atencin mdica de inmediato. Comunquese con el servicio de emergencias de su localidad (911 en los [place]). No conduzca por sus propios medios [facility]. Resumen Los virus son tipos de microbios que entran en el organismo de Kendra Rodriguez persona y Doctor, general practice. Las enfermedades virales pueden ser leves o graves. Pueden afectar diferentes partes del cuerpo. Los virus pueden ser difciles de Warehouse manager. Hay medicamentos para aliviar los sntomas y hay algunos medicamentos antivirales. Si le recetaron un medicamento antiviral, tmelo como se lo haya indicado el mdico. No deje de tomar el antiviral aunque comience a sentirse mejor. Comunquese con un mdico si tiene sntomas de una enfermedad viral que no desaparecen. Esta informacin no tiene Theme park manager el consejo del mdico. Asegresede hacerle al mdico cualquier pregunta que tenga. Document Revised: 05/09/2020 Document Reviewed: 05/09/2020 Elsevier Patient Education  2022 [employer].

## 2021-06-04 NOTE — Progress Notes (Signed)
Kendra Rodriguez 55 y.o.   Chief Complaint  Patient presents with   Office Visit    Fatigue and leg weakness Patient also c/o having pain on left side of head Itching in eyes and nose    HISTORY OF PRESENT ILLNESS: This is a 55 y.o. female status post COVID infection 3 weeks ago still complaining of fatigue and general malaise.  Has intermittent left-sided headache.  Still has some itching in eyes and nose.  However slowly getting better.  Mostly complaining of generalized pains. Recently diagnosed with hypothyroidism on Synthroid 50 mcg daily.  Doing well and responding to treatment. No other complaints or medical concerns today.  HPI   Prior to Admission medications   Medication Sig Start Date End Date Taking? Authorizing Provider  atorvastatin (LIPITOR) 40 MG tablet TAKE 1 TABLET(40 MG) BY MOUTH DAILY 02/07/21  Yes Oluwatosin Bracy, Eilleen Kempf, MD  cetirizine (ZYRTEC ALLERGY) 10 MG tablet Take 1 tablet (10 mg total) by mouth daily. 05/11/21  Yes [name], PA-C  cholecalciferol (VITAMIN D3) 25 MCG (1000 UNIT) tablet Take 1,000 Units by mouth daily.   Yes [provider]  diclofenac Sodium (QC DICLOFENAC SODIUM) 1 % GEL Apply 4 g topically 4 (four) times daily. 01/02/21  Yes Kerrin Champagne, MD  famotidine (PEPCID) 40 MG tablet Take 40 mg by mouth daily.   Yes [provider]  fluticasone (FLONASE) 50 MCG/ACT nasal spray Place 2 sprays into both nostrils daily. 05/03/21  Yes Covington, Maralyn Sago M, PA-C  gabapentin (NEURONTIN) 100 MG capsule Take 1 capsule (100 mg total) by mouth at bedtime. 01/02/21  Yes Kerrin Champagne, MD  levothyroxine (SYNTHROID) 50 MCG tablet Take 1 tablet (50 mcg total) by mouth daily. 03/29/21  Yes Romero Belling, MD  metFORMIN (GLUCOPHAGE-XR) 500 MG 24 hr tablet Take 3 tablets (1,500 mg total) by mouth daily. 05/28/21  Yes Romero Belling, MD  pantoprazole (PROTONIX) 40 MG tablet Take 40 mg by mouth daily.   Yes [provider]  sucralfate (CARAFATE) 1  GM/10ML suspension  01/06/20  Yes [provider]  SUMAtriptan (IMITREX) 50 MG tablet Take 50 mg at the onset of headache; may repeat every 2 hours but no more than 200 mg/24 hours. 06/04/20  Yes Desiray Orchard, Eilleen Kempf, MD  triamcinolone (NASACORT) 55 MCG/ACT AERO nasal inhaler Place 2 sprays into the nose daily. 03/02/20  Yes Lezlie Lye, Irma M, MD  Benay Spice 5 % SOLN Place 1 drop into both eyes 2 (two) times daily. 03/23/20  Yes [provider]    No Known Allergies  Patient Active Problem List   Diagnosis Date Noted   Hyperthyroidism 03/29/2021   Body mass index (BMI) of 35.0-35.9 in adult 06/04/2020   History of migraine headaches 12/06/2019   Dyslipidemia 06/30/2019   GERD with esophagitis 06/30/2019   Prediabetes 10/17/2015   Spinal stenosis, lumbar region, with neurogenic claudication 10/24/2014    Past Medical History:  Diagnosis Date   Acute sinusitis    Allergy    Anxiety    Depression    NO MEDICATIONS - DOING OK NOW   Dizziness    Ear ache    RIGHT --HX OF MULTIPLE EAR INFECTIONS AS A CHILD    GERD (gastroesophageal reflux disease)    Headache(784.0)    MIGRAINES   Leg pain, left    with swelling   Lumbar stenosis    PAIN BACK AND LEFT LEG AND NUMBNESS LEG AND FOOT   Peripheral vascular disease (HCC)  HX OF TREATMENT FOR VARICOSE VEINS RIGHT LEG   Thyroid disease    TOLD BLOOD LEVELS SLIGHTLY ABNORMAL - BUT NOT REQUIRED MEDICATION    Past Surgical History:  Procedure Laterality Date   ABDOMINAL HYSTERECTOMY     BACK SURGERY     bladder lift     AT SAME TIME OF HYSTERECTOMY   CHOLECYSTECTOMY     LUMBAR LAMINECTOMY/DECOMPRESSION MICRODISCECTOMY Left 10/24/2014   Procedure: COMPLETE LUMBAR DECOMPRESSION L4-L5 CENTRAL AND MICRODISCECTOMY L4-L5 LEFT;  Surgeon: Jacki Cones, MD;  Location: [facility];  Service: Orthopedics;  Laterality: Left;    Social History   Socioeconomic History   Marital status: Married    Spouse name: Not on file    Number of children: Not on file   Years of education: Not on file   Highest education level: Not on file  Occupational History   Not on file  Tobacco Use   Smoking status: Never   Smokeless tobacco: Never  Vaping Use   Vaping Use: Never used  Substance and Sexual Activity   Alcohol use: No   Drug use: No   Sexual activity: Not on file  Other Topics Concern   Not on file  Social History Narrative   Not on file   Social Determinants of Health   Financial Resource Strain: Not on file  Food Insecurity: Not on file  Transportation Needs: Not on file  Physical Activity: Not on file  Stress: Not on file  Social Connections: Not on file  Intimate Partner Violence: Not on file    Family History  Problem Relation Age of Onset   Hypertension Mother    Arthritis Mother    Diabetes Mother    Hyperlipidemia Mother    Arthritis Sister    Rheum arthritis Brother    Autoimmune disease Brother    Lung disease Brother    Arthritis Sister    Gout Son    High Cholesterol Daughter    Thyroid disease Neg Hx      Review of Systems  Constitutional:  Positive for malaise/fatigue. Negative for chills and fever.  HENT:  Positive for congestion. Negative for sore throat.   Respiratory: Negative.  Negative for cough and shortness of breath.   Cardiovascular: Negative.  Negative for chest pain and palpitations.  Gastrointestinal: Negative.  Negative for abdominal pain, diarrhea, nausea and vomiting.  Genitourinary: Negative.   Skin: Negative.  Negative for rash.  Neurological: Negative.  Negative for dizziness and headaches.  All other systems reviewed and are negative.  Vitals:   06/04/21 1518  BP: 122/70  Pulse: 81  Temp: 98.3 F (36.8 C)  SpO2: 98%    Physical Exam Vitals reviewed.  Constitutional:      Appearance: Normal appearance.  HENT:     Head: Normocephalic.     Right Ear: Tympanic membrane, ear canal and external ear normal.     Left Ear: Tympanic membrane, ear  canal and external ear normal.     Mouth/Throat:     Mouth: Mucous membranes are moist.     Pharynx: Oropharynx is clear.  Eyes:     Extraocular Movements: Extraocular movements intact.     Conjunctiva/sclera: Conjunctivae normal.     Pupils: Pupils are equal, round, and reactive to light.  Cardiovascular:     Rate and Rhythm: Normal rate and regular rhythm.     Pulses: Normal pulses.     Heart sounds: Normal heart sounds.  Pulmonary:     Effort:  Pulmonary effort is normal.     Breath sounds: Normal breath sounds.  Musculoskeletal:        General: Normal range of motion.     Cervical back: Normal range of motion and neck supple.  Skin:    General: Skin is warm and dry.     Capillary Refill: Capillary refill takes less than 2 seconds.  Neurological:     General: No focal deficit present.     Mental Status: She is alert and oriented to person, place, and time.  Psychiatric:        Mood and Affect: Mood normal.        Behavior: Behavior normal.     ASSESSMENT & PLAN: Clinically stable.  No medical concerns identified during this visit. Postviral syndrome symptoms. No red flag signs or symptoms. Supportive treatment.  Tylenol every 4-6 hours as needed for pains. Advised to contact the office if no better or worse during the next several days.  Kallie was seen today for office visit.  Diagnoses and all orders for this visit:  Post-COVID syndrome  Patient Instructions  Enfermedades virales en los adultos Viral Illness, Adult Los virus son microbios diminutos que entran en el organismo de Neomia Dear persona y Doctor, general practice. Hay muchos tipos de virus diferentes y causan muchas clases de enfermedades. Las enfermedades virales pueden ser leves o graves.Pueden afectar diferentes partes del cuerpo. Entre las afecciones a corto plazo causadas por virus, se incluyen los resfros y Emergency planning/management officer. Entre las afecciones a largo plazo causadas por un virus, incluyen el herpes, la culebrilla y la  infeccin por VIH (virus de inmunodeficiencia humana). Se han identificado unos pocos virus asociados con determinados Tax adviser. Cules son las causas? Muchos tipos de virus pueden causar enfermedades. Los virus invaden las clulas del [place], se multiplican y [employer] las clulas infectadas funcionen de manera anormal o [place]. Cuando estas clulas mueren, liberan ms virus. Cuando esto ocurre, aparecen sntomas de la enfermedad, y el virus sigue diseminndose a otras clulas. Si el virus asume la funcin de la clula, puede hacer que esta se divida y prolifere de Psychologist, occupational. Esto ocurrecuando un virus Customer service manager. Los diferentes virus ingresan al organismo de [employer]. Puede contraer un virus de la siguiente [place]: Al ingerir alimentos o beber agua que han entrado en contacto con el virus (estn contaminados). Al inhalar gotitas que una persona infectada liber en el aire al toser o estornudar. Al tocar una superficie contaminada con el virus y [facility] mano a la boca, la nariz o los ojos. Al ser picado por un insecto o mordido por un animal que son portadores del virus. Al tener contacto sexual con Neomia Dear persona infectada por el virus. Al tener contacto con sangre o lquidos que contienen el virus, ya sea a travs de un corte abierto o durante una transfusin. Si el virus ingresa al organismo, el sistema de defensa del cuerpo (sistema inmunitario) Product/process development scientist. Puede correr un riesgo ms alto de tener una enfermedadviral si tiene el sistema inmunitario debilitado. Cules son los signos o sntomas? Puede tener los siguientes sntomas, dependiendo del tipo de virus y de la ubicacin de las clulas que invade: Virus del resfro y de la gripe: Grant Ruts. Dolor de [place]. Dolor de Advertising copywriter. Dolores musculares. Nariz tapada (congestin nasal). Tos. Virus del aparato digestivo (gastrointestinales): Grant Ruts. Dolor en el  abdomen. Nuseas. Diarrea. Virus hepticos (hepatitis): Prdida del apetito. Cansancio. La piel o las partes blancas de los ojos se ponen  amarillas (ictericia). Virus del cerebro y la mdula espinal: Grant RutsFiebre. Dolor de [place]. Rigidez en el cuello. Nuseas y vmitos. Confusin o somnolencia. Virus de la piel: Verrugas. Picazn. Erupcin cutnea. Virus de transmisin sexual: Secrecin. Hinchazn. Enrojecimiento. Erupcin cutnea. Cmo se diagnostica? Esta afeccin se puede diagnosticar en funcin de lo siguiente: Sntomas. Antecedentes mdicos. Examen fsico. Anlisis de [place], [place] muestra de mucosidad de los pulmones (muestra de esputo), [place]muestra de heces o un hisopado de lquidos corporales o una llaga de la piel (lesin). Cmo se trata? Los virus pueden ser difciles de tratar porque se hospedan en las clulas. Los antibiticos no tratan los virus porque estos medicamentos no llegan al interior de las clulas. El tratamiento de una enfermedad viral puede incluir lo siguiente: LawyerDescansar y beber abundantes lquidos. Medicamentos para [employer] Groupaliviar los sntomas. Estos pueden incluir medicamentos de venta libre para Chief Technology Officerel dolor y la [place], medicamentos para la tos o la congestin y medicamentos para Actuaryaliviar la diarrea. Medicamentos antivirales. Estos medicamentos estn disponibles nicamente para ciertos tipos de virus. Algunas enfermedades virales pueden evitarse con vacunas. Un ejemplo frecuentees la vacuna antigripal. Siga estas instrucciones en su casa: Medicamentos Use los medicamentos de venta libre y los recetados solamente como se lo haya indicado el mdico. Si le recetaron un medicamento antiviral, tmelo como se lo haya indicado el mdico. No deje de tomar el antiviral aunque comience a sentirse mejor. Infrmese sobre cundo los antibiticos son necesarios y cundo no. Los antibiticos no combaten a los virus. Si tiene una infeccin viral y el mdico cree que tambin tiene una  infeccin [place], o que est en riesgo de [place], tal vez le recete un antibitico. No solicite una receta para antibiticos si le han diagnosticado una enfermedad viral. Los antibiticos no harn que se cure ms rpidamente. Tomar antibiticos con frecuencia cuando no son necesarios puede derivar en resistencia a los antibiticos. Cuando esto ocurre, el medicamento pierde su eficacia contra las bacterias que normalmente combate. Indicaciones generales  Beba suficiente lquido para mantener la orina de color amarillo plido. Descanse todo lo que pueda. Retome sus actividades normales segn lo indicado por el mdico. Pregntele al mdico qu actividades son seguras para usted. Concurra a todas las visitas de [address] como se lo haya indicado el mdico. Esto es importante.  Cmo se previene? Para reducir el riesgo de tener una enfermedad viral: Lvese las manos frecuentemente con agua y jabn durante al menos 20 segundos. Use desinfectante para manos si no dispone de [place] y [place]. Evite tocarse la nariz, los ojos y la boca, sobre todo si no se lav las manos recientemente. Si un miembro de la familia tiene una infeccin viral, limpie todas las superficies de la casa que puedan haber estado en contacto con el virus. Use agua caliente y [place]. Tambin puede usar leja con agua agregada (diluido). Mantngase lejos de las personas enfermas con sntomas de una infeccin viral. No comparta objetos tales como cepillos de dientes y botellas de agua con Economistotras personas. Mantenga las vacunas al da. Esto incluye recibir la vacuna antigripal todos los [place]. Siga una dieta saludable y descanse lo suficiente. Comunquese con un mdico si: Tiene sntomas de una enfermedad viral que no desaparecen. Los sntomas regresan despus de haber desaparecido. Sus sntomas empeoran. Solicite ayuda de inmediato si tiene: Dificultad para Industrial/product designerrespirar. Dolor de cabeza intenso o rigidez en el cuello. Vmitos  abundantes o dolor en el abdomen. Estos sntomas pueden representar un problema grave que constituye Radio broadcast assistantuna emergencia. No espere a ver si  los sntomas desaparecen. Solicite atencin mdica de inmediato. Comunquese con el servicio de emergencias de su localidad (911 en los [place]). No conduzca por sus propios medios [facility] hospital. Resumen Los virus son tipos de microbios que entran en el organismo de Neomia Dear persona y Doctor, general practice. Las enfermedades virales pueden ser leves o graves. Pueden afectar diferentes partes del cuerpo. Los virus pueden ser difciles de Warehouse manager. Hay medicamentos para aliviar los sntomas y hay algunos medicamentos antivirales. Si le recetaron un medicamento antiviral, tmelo como se lo haya indicado el mdico. No deje de tomar el antiviral aunque comience a sentirse mejor. Comunquese con un mdico si tiene sntomas de una enfermedad viral que no desaparecen. Esta informacin no tiene Theme park manager el consejo del mdico. Asegresede hacerle al mdico cualquier pregunta que tenga. Document Revised: 05/09/2020 Document Reviewed: 05/09/2020 Elsevier Patient Education  2022 Elsevier Inc.    Edwina Barth, MD [facility]

## 2021-06-13 ENCOUNTER — Encounter: Payer: Self-pay | Admitting: Physical Therapy

## 2021-06-13 ENCOUNTER — Encounter: Payer: Self-pay | Admitting: Specialist

## 2021-06-13 ENCOUNTER — Ambulatory Visit (INDEPENDENT_AMBULATORY_CARE_PROVIDER_SITE_OTHER): Payer: 59 | Admitting: Specialist

## 2021-06-13 ENCOUNTER — Ambulatory Visit: Payer: 59 | Attending: Specialist | Admitting: Physical Therapy

## 2021-06-13 ENCOUNTER — Other Ambulatory Visit: Payer: Self-pay

## 2021-06-13 VITALS — BP 124/74 | HR 61 | Ht 63.0 in | Wt 188.0 lb

## 2021-06-13 DIAGNOSIS — M25511 Pain in right shoulder: Secondary | ICD-10-CM | POA: Insufficient documentation

## 2021-06-13 DIAGNOSIS — R293 Abnormal posture: Secondary | ICD-10-CM | POA: Insufficient documentation

## 2021-06-13 DIAGNOSIS — M542 Cervicalgia: Secondary | ICD-10-CM | POA: Diagnosis not present

## 2021-06-13 DIAGNOSIS — M4316 Spondylolisthesis, lumbar region: Secondary | ICD-10-CM | POA: Diagnosis not present

## 2021-06-13 DIAGNOSIS — M47812 Spondylosis without myelopathy or radiculopathy, cervical region: Secondary | ICD-10-CM

## 2021-06-13 DIAGNOSIS — G8929 Other chronic pain: Secondary | ICD-10-CM | POA: Diagnosis present

## 2021-06-13 DIAGNOSIS — M5136 Other intervertebral disc degeneration, lumbar region: Secondary | ICD-10-CM | POA: Diagnosis not present

## 2021-06-13 DIAGNOSIS — M545 Low back pain, unspecified: Secondary | ICD-10-CM | POA: Diagnosis present

## 2021-06-13 DIAGNOSIS — M7671 Peroneal tendinitis, right leg: Secondary | ICD-10-CM

## 2021-06-13 DIAGNOSIS — M7672 Peroneal tendinitis, left leg: Secondary | ICD-10-CM

## 2021-06-13 MED ORDER — DICLOFENAC SODIUM 1 % EX GEL: 2.0000 g | Freq: Four times a day (QID) | CUTANEOUS | 3 refills | Status: DC

## 2021-06-13 NOTE — Patient Instructions (Addendum)
Referral to physical therapy for both bilateral foot peroneal tendonitis and for lumbar ddd.  Voltaren gel applied to the feet 3-4 times per day. Warm soaks with epsum salts 15 minutes a day. See therapy for transdermal steroid iontophoresis and exercises to stretch heel cords and strengthen the muscles over the outer foot.  Weight loss would likely be of benefit.  Avoid bending, stooping and avoid lifting weights greater than 10 lbs. Avoid prolong standing and walking. Avoid frequent bending and stooping  No lifting greater than 10 lbs. May use ice or moist heat for pain. Weight loss is of benefit. Handicap license is approved.

## 2021-06-13 NOTE — Progress Notes (Signed)
Office Visit Note   Patient: Kendra Rodriguez           Date of Birth: 1966-05-10           MRN: [id number] Visit Date: 06/13/2021              Requested by: [name], MD [address] [place],  [place] [address] PCP: [name], MD   Assessment & Plan: Visit Diagnoses:  1. Spondylolisthesis, lumbar region   2. Spondylosis without myelopathy or radiculopathy, cervical region   3. Cervicalgia   4. Degenerative disc disease, lumbar   5. Peroneal tendinitis of right lower extremity   6. Peroneal tendonitis, left     Plan: Referral to physical therapy for both bilateral foot peroneal tendonitis and for lumbar ddd.  Voltaren gel applied to the feet 3-4 times per day. Warm soaks with epsum salts 15 minutes a day. See therapy for transdermal steroid iontophoresis and exercises to stretch heel cords and strengthen the muscles over the outer foot.  Weight loss would likely be of benefit.   Follow-Up Instructions: Return in about 4 weeks (around 07/11/2021).   Orders:  No orders of the defined types were placed in this encounter.  No orders of the defined types were placed in this encounter.     Procedures: No procedures performed   Clinical Data: No additional findings.   Subjective: Chief Complaint  Patient presents with   Neck - Follow-up   Right Hand - Follow-up   Left Hand - Follow-up    55 year old female with history of lumbar decompression by Dr. [name] in 2018 L4-5 she is experiencing back pain and there is some pain into both legs for The knees downwards and she has feet pain with standing and walking and relieved by sitting. Her back pian though is worse with  standing and walking. No bowel or bladder difficulty. Pain is present bending and stooping and prolong sitting and riding in the car.  There is pain with vaccuuming and sweeping. She has been to physical therapy and this helped. Her pain is present with prolong standing and  walking but she is able to grocery shop   Review of Systems  Constitutional:  Positive for chills, fatigue and fever.  HENT:  Positive for congestion, sneezing and trouble swallowing.   Eyes:  Positive for visual disturbance. Negative for photophobia, pain, discharge, redness and itching.  Respiratory: Negative.    Cardiovascular: Negative.   Gastrointestinal: Negative.  Negative for abdominal distention, abdominal pain, anal bleeding, blood in stool, constipation, diarrhea, nausea, rectal pain and vomiting.  Endocrine: Negative.  Negative for cold intolerance, heat intolerance, polydipsia, polyphagia and polyuria.  Genitourinary:  Positive for frequency and urgency. Negative for difficulty urinating, dyspareunia, dysuria, enuresis, flank pain, genital sores, hematuria and menstrual problem.  Musculoskeletal: Negative.  Negative for arthralgias and back pain (worsening).  Skin: Negative.   Allergic/Immunologic: Negative.   Neurological:  Positive for weakness. Negative for tremors, seizures, syncope, speech difficulty, light-headedness and headaches.  Hematological:  Negative for adenopathy. Does not bruise/bleed easily.    Objective: Vital Signs: BP 124/74 (BP Location: Left Arm, Patient Position: Sitting)   Pulse 61   Ht 5\' 3"  (1.6 m)   Wt 188 lb (85.3 kg)   BMI 33.30 kg/m   Physical Exam Constitutional:      Appearance: She is well-developed.  HENT:     Head: Normocephalic and atraumatic.  Eyes:     Pupils: Pupils  are equal, round, and reactive to light.  Pulmonary:     Effort: Pulmonary effort is normal.     Breath sounds: Normal breath sounds.  Abdominal:     General: Bowel sounds are normal.     Palpations: Abdomen is soft.  Musculoskeletal:     Cervical back: Normal range of motion and neck supple.     Lumbar back: Negative right straight leg raise test and negative left straight leg raise test.  Skin:    General: Skin is warm and dry.  Neurological:     Mental  Status: She is alert and oriented to person, place, and time.  Psychiatric:        Behavior: Behavior normal.        Thought Content: Thought content normal.        Judgment: Judgment normal.   Back Exam   Tenderness  The patient is experiencing tenderness in the lumbar.  Range of Motion  Extension:  abnormal  Flexion:  abnormal  Lateral bend right:  abnormal  Rotation right:  abnormal  Rotation left:  abnormal   Muscle Strength  Right Quadriceps:  5/5  Left Quadriceps:  5/5  Right Hamstrings:  5/5  Left Hamstrings:  5/5   Tests  Straight leg raise right: negative Straight leg raise left: negative  Other  Toe walk: normal Heel walk: normal    Specialty Comments:  No specialty comments available.  Imaging: No results found.   PMFS History: Patient Active Problem List   Diagnosis Date Noted   Post-COVID syndrome 06/04/2021   Hyperthyroidism 03/29/2021   Body mass index (BMI) of 35.0-35.9 in adult 06/04/2020   History of migraine headaches 12/06/2019   Dyslipidemia 06/30/2019   GERD with esophagitis 06/30/2019   Prediabetes 10/17/2015   Spinal stenosis, lumbar region, with neurogenic claudication 10/24/2014   Past Medical History:  Diagnosis Date   Acute sinusitis    Allergy    Anxiety    Depression    NO MEDICATIONS - DOING OK NOW   Dizziness    Ear ache    RIGHT --HX OF MULTIPLE EAR INFECTIONS AS A CHILD    GERD (gastroesophageal reflux disease)    Headache(784.0)    MIGRAINES   Leg pain, left    with swelling   Lumbar stenosis    PAIN BACK AND LEFT LEG AND NUMBNESS LEG AND FOOT   Peripheral vascular disease (HCC)    HX OF TREATMENT FOR VARICOSE VEINS RIGHT LEG   Thyroid disease    TOLD BLOOD LEVELS SLIGHTLY ABNORMAL - BUT NOT REQUIRED MEDICATION    Family History  Problem Relation Age of Onset   Hypertension Mother    Arthritis Mother    Diabetes Mother    Hyperlipidemia Mother    Arthritis Sister    Rheum arthritis Brother     Autoimmune disease Brother    Lung disease Brother    Arthritis Sister    Gout Son    High Cholesterol Daughter    Thyroid disease Neg Hx     Past Surgical History:  Procedure Laterality Date   ABDOMINAL HYSTERECTOMY     BACK SURGERY     bladder lift     AT SAME TIME OF HYSTERECTOMY   CHOLECYSTECTOMY     LUMBAR LAMINECTOMY/DECOMPRESSION MICRODISCECTOMY Left 10/24/2014   Procedure: COMPLETE LUMBAR DECOMPRESSION L4-L5 CENTRAL AND MICRODISCECTOMY L4-L5 LEFT;  Surgeon: Jacki Cones, MD;  Location: [facility];  Service: Orthopedics;  Laterality: Left;   Social History  Occupational History   Not on file  Tobacco Use   Smoking status: Never   Smokeless tobacco: Never  Vaping Use   Vaping Use: Never used  Substance and Sexual Activity   Alcohol use: No   Drug use: No   Sexual activity: Not on file

## 2021-06-13 NOTE — Therapy (Signed)
[facility] [address] [place], [place], [address] Phone: [phone]   Fax:  [phone]  Physical Therapy Evaluation  Patient Details  Name: Kendra Rodriguez MRN: [id number] Date of Birth: Dec 08, 1965 Referring Provider (PT): Vira BrownsJames nitka MD   Encounter Date: 06/13/2021   PT End of Session - 06/13/21 1636     Visit Number 1    Number of Visits 17    Date for PT Re-Evaluation 08/08/21    Authorization Type [employer],    PT Start Time 1633    PT Stop Time 1718    PT Time Calculation (min) 45 min    Activity Tolerance Patient tolerated treatment well    Behavior During Therapy WFL for tasks assessed/performed             Past Medical History:  Diagnosis Date   Acute sinusitis    Allergy    Anxiety    Depression    NO MEDICATIONS - DOING OK NOW   Dizziness    Ear ache    RIGHT --HX OF MULTIPLE EAR INFECTIONS AS A CHILD    GERD (gastroesophageal reflux disease)    Headache(784.0)    MIGRAINES   Leg pain, left    with swelling   Lumbar stenosis    PAIN BACK AND LEFT LEG AND NUMBNESS LEG AND FOOT   Peripheral vascular disease (HCC)    HX OF TREATMENT FOR VARICOSE VEINS RIGHT LEG   Thyroid disease    TOLD BLOOD LEVELS SLIGHTLY ABNORMAL - BUT NOT REQUIRED MEDICATION    Past Surgical History:  Procedure Laterality Date   ABDOMINAL HYSTERECTOMY     BACK SURGERY     bladder lift     AT SAME TIME OF HYSTERECTOMY   CHOLECYSTECTOMY     LUMBAR LAMINECTOMY/DECOMPRESSION MICRODISCECTOMY Left 10/24/2014   Procedure: COMPLETE LUMBAR DECOMPRESSION L4-L5 CENTRAL AND MICRODISCECTOMY L4-L5 LEFT;  Surgeon: Jacki Conesonald A Gioffre, MD;  Location: [facility];  Service: Orthopedics;  Laterality: Left;    There were no vitals filed for this visit.    Subjective Assessment - 06/13/21 1637     Subjective pt reports having neck pain for about 3 months and is having a crunching sensation with looking up/ down pain is along both sides of  the neck and to the back of the head and reports potential referral to the R shoulder. She has low back pain that has been going on for years but notes worsening [place] and reports getting covid which the combination of the two caused the back to get worse.  She reports some referral down the LLE. she denies any red flags.    How long can you sit comfortably? 30 min    How long can you stand comfortably? 30 - 40 min    How long can you walk comfortably? 30-40 min    Diagnostic tests xray at [facility] for neck and back.    Currently in Pain? Yes    Pain Score 6     Pain Location Neck    Pain Orientation Left;Right    Pain Descriptors / Indicators Aching;Sore;Tightness    Pain Type Chronic pain    Pain Onset More than a month ago    Pain Frequency Intermittent    Aggravating Factors  looking up/ down, laying down and sleeping    Pain Relieving Factors heat in the shower    Pain Score 8    Pain Location Back  Pain Orientation Right;Lower    Pain Type Chronic pain    Pain Onset More than a month ago    Pain Frequency Intermittent    Aggravating Factors  sitting down for too long    Pain Relieving Factors walking                [facility] PT Assessment - 06/13/21 0001       Assessment   Medical Diagnosis Spondylolisthesis, lumbar region (M43.16), Tendonitis of both shoulders (M77.8)    Referring Provider (PT) [name]    Onset Date/Surgical Date 07/19/21    Hand Dominance Right    Prior Therapy yes      Precautions   Precautions --    Precaution Comments to avoid doing exercise for too long or hold of hurting back.      Restrictions   Weight Bearing Restrictions No      Balance Screen   Has the patient fallen in the past 6 months No      Home Environment   Living Environment Private residence    Living Arrangements Spouse/significant other    Available Help at Discharge Family    Type of Home House      Prior Function   Level of  Independence Independent      Cognition   Overall Cognitive Status Within Functional Limits for tasks assessed      Observation/Other Assessments   Focus on Therapeutic Outcomes (FOTO)  55%   predicted     Posture/Postural Control   Posture/Postural Control Postural limitations    Postural Limitations Rounded Shoulders;Forward head      AROM   Overall AROM Comments shouler ROM WFL bil, repeated flexion in standing caused peripheralization,    AROM Assessment Site Cervical    Cervical Flexion 50   end range pain   Cervical Extension 30   soreness at end range   Cervical - Right Side Bend 30    Cervical - Left Side Bend 30   contarlatera end range pain   Cervical - Right Rotation 158    Cervical - Left Rotation 148    Lumbar Flexion 60   end range pain reproducing LLE referred pain   Lumbar Extension 10   pain in the back   Lumbar - Right Side Bend 15   ipslateral pain   Lumbar - Left Side Bend 20   ipslateral pain     Strength   Strength Assessment Site Hip;Knee    Right Shoulder Flexion 4/5    Right Shoulder Extension 4/5   pain during testing   Right Shoulder ABduction 4/5    Right Shoulder Internal Rotation 4+/5    Right Shoulder External Rotation 4+/5    Left Shoulder Flexion 4/5    Left Shoulder Extension 4+/5    Left Shoulder ABduction 4/5    Left Shoulder Internal Rotation 4+/5    Left Shoulder External Rotation 4+/5    Right/Left Hip Right;Left    Right Hip Flexion 4-/5    Right Hip Extension 4-/5    Right Hip ABduction 4-/5    Left Hip Flexion 4-/5    Left Hip Extension 4-/5    Left Hip ABduction 4/5    Right/Left Knee Right;Left    Right Knee Flexion 4+/5    Right Knee Extension 4+/5    Left Knee Flexion 4+/5    Left Knee Extension 4+/5      Palpation   Palpation comment TTP located along the upper  trap/ levator scapulae, cervical paraspinals. bil lumbar paraspinals with R>L                        Objective measurements completed on  examination: See above findings.               PT Education - 06/13/21 1738     Education Details evaluation findings, POC, goals, HEP with proper form/ rationale, FOTO assessment    Person(s) Educated Patient    Methods Explanation;Verbal cues;Handout    Comprehension Verbalized understanding              PT Short Term Goals - 06/13/21 1746       PT SHORT TERM GOAL #1   Title pt to be I with inital HEP    Baseline -    Time 4    Period Weeks    Status New    Target Date 07/11/21      PT SHORT TERM GOAL #2   Title pt to verbalize/ demo efficient posture and lifting mechanics to reduce and prevent shoulder / back pain    Baseline -    Time 4    Period Weeks    Status New    Target Date 07/11/21      PT SHORT TERM GOAL #3   Title increase bil hip abductor strength by at leaset 1 muscle grade.    Baseline -    Time 4    Period Weeks    Status New    Target Date 07/11/21               PT Long Term Goals - 06/13/21 1747       PT LONG TERM GOAL #1   Title pt to report reduced cervical pain to </= 2/10 max for improvement of condition    Baseline -    Time 8    Period Weeks    Status New    Target Date 08/08/21      PT LONG TERM GOAL #2   Title pt to maintain trunk mobility in all planes with </= 2/10 max pain for improving QOL    Time 8    Period Weeks    Status New    Target Date 08/08/21      PT LONG TERM GOAL #3   Title pt to report no LLE referred symptoms for >/= 2 weeks for improvement in condition    Time 8    Period Weeks    Status New    Target Date 08/08/21      PT LONG TERM GOAL #4   Title increase FOTO to >/=55% to demo improvement in function    Time 8    Period Weeks    Status New    Target Date 08/08/21      PT LONG TERM GOAL #5   Title pt to be IND with all HEP given and is able to maintain and progress current LOF    Time 8    Period Weeks    Status New    Target Date 08/08/21                     Plan - 06/13/21 1739     Clinical Impression Statement pt is a pleasant 55 y.o F familiar to this PT presenting to OPPT with CC of neck pain and low back pain with LLE referred symptoms. She has functional cervical mobility  with end range pain noted with all ranges. functional trunk mobility with noted peripheralization during trunk flexion, and responded with centralization with extension. she would benefit from physical therapy to reduce neck/ back pain, reduce LE referred symptoms, promote strength and maximize her function by addressing the deficits listed.    Personal Factors and Comorbidities Age;Comorbidity 2;Past/Current Experience    Comorbidities hx of depression, anxiety    Examination-Activity Limitations Sit;Stairs;Squat    Stability/Clinical Decision Making Unstable/Unpredictable    Clinical Decision Making High    Rehab Potential Good    PT Frequency 2x / week    PT Duration 8 weeks    PT Treatment/Interventions ADLs/Self Care Home Management;Iontophoresis 4mg /ml Dexamethasone;Moist Heat;Traction;Electrical Stimulation;Therapeutic exercise;Therapeutic activities;Balance training;Gait training;Cryotherapy;Neuromuscular re-education;Patient/family education;Manual techniques;Passive range of motion;Dry needling;Taping    PT Next Visit Plan review/ update HEP, response to prone extension exercise, cervical extension strength, posture education, STW    PT Home Exercise Plan [facility] - chin tuck, scapular retraction, prone on elbows, ltr    Consulted and Agree with Plan of Care Patient             Patient will benefit from skilled therapeutic intervention in order to improve the following deficits and impairments:  Improper body mechanics, Increased muscle spasms, Decreased strength, Pain, Postural dysfunction, Decreased activity tolerance, Decreased endurance  Visit Diagnosis: Cervicalgia  Chronic bilateral low back pain, unspecified whether sciatica present  Abnormal  posture  Chronic right shoulder pain     Problem List Patient Active Problem List   Diagnosis Date Noted   Post-COVID syndrome 06/04/2021   Hyperthyroidism 03/29/2021   Body mass index (BMI) of 35.0-35.9 in adult 06/04/2020   History of migraine headaches 12/06/2019   Dyslipidemia 06/30/2019   GERD with esophagitis 06/30/2019   Prediabetes 10/17/2015   Spinal stenosis, lumbar region, with neurogenic claudication 10/24/2014   10/26/2014 PT, DPT, LAT, ATC  06/13/21  5:57 PM      [facility] [address] [place], [place], [place] Phone: [phone]   Fax:  [phone]  Name: ROSEZETTA BALDERSTON MRN: Kendra Pod Date of Birth: June 13, 1966

## 2021-06-17 ENCOUNTER — Encounter (HOSPITAL_COMMUNITY): Payer: Self-pay | Admitting: Gastroenterology

## 2021-06-20 NOTE — Progress Notes (Signed)
Pt family member called and stated patient has been exposed to covid and wants to cancel for tomorrow and reschedule. Told patient to call the office to reschedule.

## 2021-06-21 ENCOUNTER — Ambulatory Visit (HOSPITAL_COMMUNITY): Admission: RE | Admit: 2021-06-21 | Payer: 59 | Source: Home / Self Care | Admitting: Gastroenterology

## 2021-06-21 ENCOUNTER — Encounter (HOSPITAL_COMMUNITY): Admission: RE | Payer: Self-pay | Source: Home / Self Care

## 2021-06-21 SURGERY — ESOPHAGOGASTRODUODENOSCOPY (EGD) WITH PROPOFOL
Anesthesia: Monitor Anesthesia Care

## 2021-07-02 ENCOUNTER — Ambulatory Visit: Payer: 59 | Admitting: Physical Therapy

## 2021-07-02 ENCOUNTER — Other Ambulatory Visit: Payer: Self-pay

## 2021-07-02 DIAGNOSIS — M545 Low back pain, unspecified: Secondary | ICD-10-CM

## 2021-07-02 DIAGNOSIS — M542 Cervicalgia: Secondary | ICD-10-CM | POA: Diagnosis not present

## 2021-07-02 DIAGNOSIS — R293 Abnormal posture: Secondary | ICD-10-CM

## 2021-07-02 DIAGNOSIS — G8929 Other chronic pain: Secondary | ICD-10-CM

## 2021-07-02 NOTE — Therapy (Signed)
[facility] Outpatient Rehabilitation [facility] [address] [place], [place], [address] Phone: [phone]   Fax:  [phone]  Physical Therapy Treatment  Patient Details  Name: Kendra Rodriguez MRN: [id number] Date of Birth: 04/05/1966 Referring Provider (PT): [name]   Encounter Date: 07/02/2021   PT End of Session - 07/02/21 1632     Visit Number 2    Number of Visits 17    Date for PT Re-Evaluation 08/08/21    Authorization Type [employer],    PT Start Time 1632    PT Stop Time 1715    PT Time Calculation (min) 43 min    Activity Tolerance Patient tolerated treatment well    Behavior During Therapy WFL for tasks assessed/performed             Past Medical History:  Diagnosis Date   Acute sinusitis    Allergy    Anxiety    Depression    NO MEDICATIONS - DOING OK NOW   Dizziness    Ear ache    RIGHT --HX OF MULTIPLE EAR INFECTIONS AS A CHILD    GERD (gastroesophageal reflux disease)    Headache(784.0)    MIGRAINES   Leg pain, left    with swelling   Lumbar stenosis    PAIN BACK AND LEFT LEG AND NUMBNESS LEG AND FOOT   Peripheral vascular disease (HCC)    HX OF TREATMENT FOR VARICOSE VEINS RIGHT LEG   Thyroid disease    TOLD BLOOD LEVELS SLIGHTLY ABNORMAL - BUT NOT REQUIRED MEDICATION    Past Surgical History:  Procedure Laterality Date   ABDOMINAL HYSTERECTOMY     BACK SURGERY     bladder lift     AT SAME TIME OF HYSTERECTOMY   CHOLECYSTECTOMY     LUMBAR LAMINECTOMY/DECOMPRESSION MICRODISCECTOMY Left 10/24/2014   Procedure: COMPLETE LUMBAR DECOMPRESSION L4-L5 CENTRAL AND MICRODISCECTOMY L4-L5 LEFT;  Surgeon: Jacki Cones, MD;  Location: [facility];  Service: Orthopedics;  Laterality: Left;    There were no vitals filed for this visit.   Subjective Assessment - 07/02/21 1634     Subjective " I've been feeling better. I only have had a little soreness in the neck but I massage and use hot water. My back has been feeling  better but I feel it when I walk down into my R leg."    Patient Stated Goals to stop the pain,    Currently in Pain? Yes    Pain Score 5     Pain Location Neck    Pain Orientation Right;Left    Pain Score 5    Pain Location Back    Pain Orientation Right;Lower    Pain Descriptors / Indicators Aching;Sore    Pain Type Chronic pain    Pain Onset More than a month ago    Pain Frequency Intermittent    Aggravating Factors  cleaning                [facility] PT Assessment - 07/02/21 0001       Assessment   Medical Diagnosis Spondylolisthesis, lumbar region (M43.16), Tendonitis of both shoulders (M77.8)                           [facility] Adult PT Treatment/Exercise - 07/02/21 0001       Neck Exercises: Supine   Capital Flexion 10 reps;10 secs   chin tuck head lift     Lumbar  Exercises: Supine   Dead Bug 10 reps   alternating L/R in tbale     Knee/Hip Exercises: Stretches   Other Knee/Hip Stretches SKTC 2 x 30 bil      Knee/Hip Exercises: Supine   Other Supine Knee/Hip Exercises marching 2 x 12 with BTB      Manual Therapy   Manual Therapy Manual Traction    Manual therapy comments MTPR along bil upper trap and sub-occipital release    Joint Mobilization RLE LAD grade Iv with gentle oscillation    Manual Traction cervical traction 2 x 2 min ea,                    PT Education - 07/02/21 1715     Education Details reviewed HEP and modified to stop prone press up and add SKTC    Person(s) Educated Patient    Methods Explanation;Verbal cues;Handout    Comprehension Verbalized understanding;Verbal cues required              PT Short Term Goals - 06/13/21 1746       PT SHORT TERM GOAL #1   Title pt to be I with inital HEP    Baseline -    Time 4    Period Weeks    Status New    Target Date 07/11/21      PT SHORT TERM GOAL #2   Title pt to verbalize/ demo efficient posture and lifting mechanics to reduce and prevent shoulder / back  pain    Baseline -    Time 4    Period Weeks    Status New    Target Date 07/11/21      PT SHORT TERM GOAL #3   Title increase bil hip abductor strength by at leaset 1 muscle grade.    Baseline -    Time 4    Period Weeks    Status New    Target Date 07/11/21               PT Long Term Goals - 06/13/21 1747       PT LONG TERM GOAL #1   Title pt to report reduced cervical pain to </= 2/10 max for improvement of condition    Baseline -    Time 8    Period Weeks    Status New    Target Date 08/08/21      PT LONG TERM GOAL #2   Title pt to maintain trunk mobility in all planes with </= 2/10 max pain for improving QOL    Time 8    Period Weeks    Status New    Target Date 08/08/21      PT LONG TERM GOAL #3   Title pt to report no LLE referred symptoms for >/= 2 weeks for improvement in condition    Time 8    Period Weeks    Status New    Target Date 08/08/21      PT LONG TERM GOAL #4   Title increase FOTO to >/=55% to demo improvement in function    Time 8    Period Weeks    Status New    Target Date 08/08/21      PT LONG TERM GOAL #5   Title pt to be IND with all HEP given and is able to maintain and progress current LOF    Time 8    Period Weeks    Status New  Target Date 08/08/21                   Plan - 07/02/21 1716     Clinical Impression Statement Kendra Rodriguez reports consistency with her HEP and additionally reports pain has dropped to a 5/10 for both neck and back. reviewed her HEP with she noted increased referred pain with prone extensions which were halted and removed from her HEP. modified to flexion based exercise for the back which she responded well to reporting a reduction in pain to 4/10. She responded well to STW for the neck and shoulders and cervical traction.    PT Treatment/Interventions ADLs/Self Care Home Management;Iontophoresis 4mg /ml Dexamethasone;Moist Heat;Traction;Electrical Stimulation;Therapeutic exercise;Therapeutic  activities;Balance training;Gait training;Cryotherapy;Neuromuscular re-education;Patient/family education;Manual techniques;Passive range of motion;Dry needling;Taping    PT Next Visit Plan review/ update HEP, response to prone extension exercise, cervical extension strength, posture education, STW    PT Home Exercise Plan [facility] - chin tuck, scapular retraction, prone on elbows, ltr    Consulted and Agree with Plan of Care Patient             Patient will benefit from skilled therapeutic intervention in order to improve the following deficits and impairments:  Improper body mechanics, Increased muscle spasms, Decreased strength, Pain, Postural dysfunction, Decreased activity tolerance, Decreased endurance  Visit Diagnosis: Cervicalgia  Chronic bilateral low back pain, unspecified whether sciatica present  Abnormal posture     Problem List Patient Active Problem List   Diagnosis Date Noted   Post-COVID syndrome 06/04/2021   Hyperthyroidism 03/29/2021   Body mass index (BMI) of 35.0-35.9 in adult 06/04/2020   History of migraine headaches 12/06/2019   Dyslipidemia 06/30/2019   GERD with esophagitis 06/30/2019   Prediabetes 10/17/2015   Spinal stenosis, lumbar region, with neurogenic claudication 10/24/2014   10/26/2014 PT, DPT, LAT, ATC  07/02/21  5:20 PM      [facility] Outpatient Rehabilitation [facility] [address] [place], [place], [place] Phone: [phone]   Fax:  [phone]  Name: Kendra Rodriguez MRN: [name] Date of Birth: 01/13/1966

## 2021-07-10 ENCOUNTER — Encounter: Payer: Self-pay | Admitting: Physical Therapy

## 2021-07-10 ENCOUNTER — Other Ambulatory Visit: Payer: Self-pay

## 2021-07-10 ENCOUNTER — Ambulatory Visit: Payer: 59 | Attending: Specialist | Admitting: Physical Therapy

## 2021-07-10 DIAGNOSIS — G8929 Other chronic pain: Secondary | ICD-10-CM | POA: Insufficient documentation

## 2021-07-10 DIAGNOSIS — M25511 Pain in right shoulder: Secondary | ICD-10-CM | POA: Insufficient documentation

## 2021-07-10 DIAGNOSIS — M542 Cervicalgia: Secondary | ICD-10-CM | POA: Insufficient documentation

## 2021-07-10 DIAGNOSIS — R293 Abnormal posture: Secondary | ICD-10-CM | POA: Diagnosis present

## 2021-07-10 DIAGNOSIS — M545 Low back pain, unspecified: Secondary | ICD-10-CM | POA: Diagnosis present

## 2021-07-10 NOTE — Therapy (Signed)
[facility] Outpatient Rehabilitation [facility] [address] [place], [place], [address] Phone: [phone]   Fax:  [phone]  Physical Therapy Treatment  Patient Details  Name: OLYMPIA ADELSBERGER MRN: [id number] Date of Birth: 05/09/1966 Referring Provider (PT): [name]   Encounter Date: 07/10/2021   PT End of Session - 07/10/21 1548     Visit Number 3    Number of Visits 17    Date for PT Re-Evaluation 08/08/21    Authorization Type [employer],    PT Start Time 1545    PT Stop Time 1628    PT Time Calculation (min) 43 min    Activity Tolerance Patient tolerated treatment well    Behavior During Therapy WFL for tasks assessed/performed             Past Medical History:  Diagnosis Date   Acute sinusitis    Allergy    Anxiety    Depression    NO MEDICATIONS - DOING OK NOW   Dizziness    Ear ache    RIGHT --HX OF MULTIPLE EAR INFECTIONS AS A CHILD    GERD (gastroesophageal reflux disease)    Headache(784.0)    MIGRAINES   Leg pain, left    with swelling   Lumbar stenosis    PAIN BACK AND LEFT LEG AND NUMBNESS LEG AND FOOT   Peripheral vascular disease (HCC)    HX OF TREATMENT FOR VARICOSE VEINS RIGHT LEG   Thyroid disease    TOLD BLOOD LEVELS SLIGHTLY ABNORMAL - BUT NOT REQUIRED MEDICATION    Past Surgical History:  Procedure Laterality Date   ABDOMINAL HYSTERECTOMY     BACK SURGERY     bladder lift     AT SAME TIME OF HYSTERECTOMY   CHOLECYSTECTOMY     LUMBAR LAMINECTOMY/DECOMPRESSION MICRODISCECTOMY Left 10/24/2014   Procedure: COMPLETE LUMBAR DECOMPRESSION L4-L5 CENTRAL AND MICRODISCECTOMY L4-L5 LEFT;  Surgeon: Jacki Cones, MD;  Location: [facility];  Service: Orthopedics;  Laterality: Left;    There were no vitals filed for this visit.   Subjective Assessment - 07/10/21 1551     Subjective "my back is doing much better at 4/10, those exercises have really helped. The neck is more sore today at at 6/10"                 [facility] PT Assessment - 07/10/21 0001       Assessment   Medical Diagnosis Spondylolisthesis, lumbar region (M43.16), Tendonitis of both shoulders (M77.8)    Referring Provider (PT) [name]                           [facility] Adult PT Treatment/Exercise - 07/10/21 0001       Neck Exercises: Stretches   Upper Trapezius Stretch 1 rep;Right;Left    Levator Stretch 1 rep;Right;Left;30 seconds      Lumbar Exercises: Supine   Dead Bug 10 reps   alternating L/R , lifting one while lowering the other   Dead Bug Limitations cues for proper form      Manual Therapy   Manual Therapy Joint mobilization    Manual therapy comments skilled palpation and monitoring of pt during TPDN    Joint Mobilization C3-C7 PA grade III, T1 - T7 PA grade III    Soft tissue mobilization IASTM along the bil upper trap / levator scapulae  Trigger Point Dry Needling - 07/10/21 0001     Consent Given? Yes    Education Handout Provided Yes    Electrical Stimulation Performed with Dry Needling Yes    E-stim with Dry Needling Details CPS 20 intensity to tolerance increased increasing as tolerated x 10 min    Upper Trapezius Response Twitch reponse elicited;Palpable increased muscle length   bil                  PT Education - 07/10/21 1600     Education Details Reviewed TPDN with benefits of Estim combined. reviewed and updated HEP for thoracic mobility    Person(s) Educated Patient    Methods Explanation;Verbal cues    Comprehension Verbalized understanding;Verbal cues required              PT Short Term Goals - 06/13/21 1746       PT SHORT TERM GOAL #1   Title pt to be I with inital HEP    Baseline -    Time 4    Period Weeks    Status New    Target Date 07/11/21      PT SHORT TERM GOAL #2   Title pt to verbalize/ demo efficient posture and lifting mechanics to reduce and prevent shoulder / back pain    Baseline -    Time 4    Period  Weeks    Status New    Target Date 07/11/21      PT SHORT TERM GOAL #3   Title increase bil hip abductor strength by at leaset 1 muscle grade.    Baseline -    Time 4    Period Weeks    Status New    Target Date 07/11/21               PT Long Term Goals - 06/13/21 1747       PT LONG TERM GOAL #1   Title pt to report reduced cervical pain to </= 2/10 max for improvement of condition    Baseline -    Time 8    Period Weeks    Status New    Target Date 08/08/21      PT LONG TERM GOAL #2   Title pt to maintain trunk mobility in all planes with </= 2/10 max pain for improving QOL    Time 8    Period Weeks    Status New    Target Date 08/08/21      PT LONG TERM GOAL #3   Title pt to report no LLE referred symptoms for >/= 2 weeks for improvement in condition    Time 8    Period Weeks    Status New    Target Date 08/08/21      PT LONG TERM GOAL #4   Title increase FOTO to >/=55% to demo improvement in function    Time 8    Period Weeks    Status New    Target Date 08/08/21      PT LONG TERM GOAL #5   Title pt to be IND with all HEP given and is able to maintain and progress current LOF    Time 8    Period Weeks    Status New    Target Date 08/08/21                   Plan - 07/10/21 1602     Clinical Impression Statement pt  reports improvement in pain in the back since the last session with the addition of the new exercises for flexion based treatment. She does report increased soreness in the neck. educated and consent was provided for TPDN for bil upper trap combined with E-stim followed with IASTM techniques and cervicothoracic mobs. She did well with thoracic mobility which was included with her HEP. End of session she noted decreased pain in the back and upper trap/ neck.    PT Treatment/Interventions ADLs/Self Care Home Management;Iontophoresis 4mg /ml Dexamethasone;Moist Heat;Traction;Electrical Stimulation;Therapeutic exercise;Therapeutic  activities;Balance training;Gait training;Cryotherapy;Neuromuscular re-education;Patient/family education;Manual techniques;Passive range of motion;Dry needling;Taping    PT Next Visit Plan review/ update HEP, response to prone extension exercise, cervical extension strength, posture education, STW    PT Home Exercise Plan [facility] - chin tuck, scapular retraction, prone on elbows, ltr, SKTC, supine marching    Consulted and Agree with Plan of Care Patient             Patient will benefit from skilled therapeutic intervention in order to improve the following deficits and impairments:  Improper body mechanics, Increased muscle spasms, Decreased strength, Pain, Postural dysfunction, Decreased activity tolerance, Decreased endurance  Visit Diagnosis: Cervicalgia  Chronic bilateral low back pain, unspecified whether sciatica present  Abnormal posture     Problem List Patient Active Problem List   Diagnosis Date Noted   Post-COVID syndrome 06/04/2021   Hyperthyroidism 03/29/2021   Body mass index (BMI) of 35.0-35.9 in adult 06/04/2020   History of migraine headaches 12/06/2019   Dyslipidemia 06/30/2019   GERD with esophagitis 06/30/2019   Prediabetes 10/17/2015   Spinal stenosis, lumbar region, with neurogenic claudication 10/24/2014   10/26/2014 PT, DPT, LAT, ATC  07/10/21  5:33 PM     [facility] Outpatient Rehabilitation [facility] [address] [place], [place], [place] Phone: [phone]   Fax:  [phone]  Name: ASLYNN BRUNETTI MRN: [name] Date of Birth: 30-Sep-1966

## 2021-07-16 ENCOUNTER — Other Ambulatory Visit: Payer: Self-pay

## 2021-07-16 ENCOUNTER — Encounter: Payer: Self-pay | Admitting: Physical Therapy

## 2021-07-16 ENCOUNTER — Ambulatory Visit: Payer: 59 | Admitting: Physical Therapy

## 2021-07-16 DIAGNOSIS — M542 Cervicalgia: Secondary | ICD-10-CM

## 2021-07-16 DIAGNOSIS — R293 Abnormal posture: Secondary | ICD-10-CM

## 2021-07-16 DIAGNOSIS — G8929 Other chronic pain: Secondary | ICD-10-CM

## 2021-07-16 DIAGNOSIS — M545 Low back pain, unspecified: Secondary | ICD-10-CM

## 2021-07-16 NOTE — Therapy (Signed)
[facility] Outpatient Rehabilitation [facility] [address] [place], [place], [address] Phone: [phone]   Fax:  [phone]  Physical Therapy Treatment  Patient Details  Name: NAVINA WOHLERS MRN: [id number] Date of Birth: 1965-12-11 Referring Provider (PT): [name]   Encounter Date: 07/16/2021   PT End of Session - 07/16/21 1545     Visit Number 4    Number of Visits 17    Date for PT Re-Evaluation 08/08/21    Authorization Type [employer],    PT Start Time 1545    Activity Tolerance Patient tolerated treatment well    Behavior During Therapy [facility] for tasks assessed/performed             Past Medical History:  Diagnosis Date   Acute sinusitis    Allergy    Anxiety    Depression    NO MEDICATIONS - DOING OK NOW   Dizziness    Ear ache    RIGHT --HX OF MULTIPLE EAR INFECTIONS AS A CHILD    GERD (gastroesophageal reflux disease)    Headache(784.0)    MIGRAINES   Leg pain, left    with swelling   Lumbar stenosis    PAIN BACK AND LEFT LEG AND NUMBNESS LEG AND FOOT   Peripheral vascular disease (HCC)    HX OF TREATMENT FOR VARICOSE VEINS RIGHT LEG   Thyroid disease    TOLD BLOOD LEVELS SLIGHTLY ABNORMAL - BUT NOT REQUIRED MEDICATION    Past Surgical History:  Procedure Laterality Date   ABDOMINAL HYSTERECTOMY     BACK SURGERY     bladder lift     AT SAME TIME OF HYSTERECTOMY   CHOLECYSTECTOMY     LUMBAR LAMINECTOMY/DECOMPRESSION MICRODISCECTOMY Left 10/24/2014   Procedure: COMPLETE LUMBAR DECOMPRESSION L4-L5 CENTRAL AND MICRODISCECTOMY L4-L5 LEFT;  Surgeon: Jacki Cones, MD;  Location: [facility];  Service: Orthopedics;  Laterality: Left;    There were no vitals filed for this visit.   Subjective Assessment - 07/16/21 1550     Subjective " the neck is feeling better    Currently in Pain? Yes    Pain Score 3     Pain Location Neck    Pain Orientation Left    Pain Descriptors / Indicators Aching    Pain Score 4    Pain  Location Back    Pain Orientation Right;Lower    Pain Descriptors / Indicators Aching                [facility] PT Assessment - 07/16/21 0001       Assessment   Medical Diagnosis Spondylolisthesis, lumbar region (M43.16), Tendonitis of both shoulders (M77.8)    Referring Provider (PT) [name]                        [facility] Adult PT Treatment/Exercise:  Therapeutic Exercise: Chin tuck 1 x 5 holding 10 sec Chin tuck head lift 1 x 5  10 sec Upper trap stretch 1 x 30 R only Supine horizontal abduction 2 x 10 RTB Double ER with scapular retraction 2 x 10 RTB Abdominal set with calfs on red physiobball for decompression pos. 1 x 10 holding 5 sec Supine hip flexion rolling red ball back 2 x 20 with RTB around both feet LTR with calfs on 1 x 20 with calves on red physioball Pressing down with bil UE on red physioball pushing down onto legs 2 x 15  Manual Therapy:  MTPR along the bil upper trap/ levator scapulae MTPR along R lumbar paraspinals DTM along th R lumbar paraspinals Sub-occiptial release Tack and stretch of cervical multifid  Neuromuscular re-ed: N/A  Therapeutic Activity: N/A  Self Care N/A  ITEMS NOT PERFORMED TODAY: TPDN               PT Short Term Goals - 07/16/21 1622       PT SHORT TERM GOAL #1   Title pt to be I with inital HEP    Period Weeks    Status Achieved      PT SHORT TERM GOAL #2   Title pt to verbalize/ demo efficient posture and lifting mechanics to reduce and prevent shoulder / back pain    Period Weeks    Status Achieved      PT SHORT TERM GOAL #3   Title increase bil hip abductor strength by at leaset 1 muscle grade.    Period Weeks    Status Achieved               PT Long Term Goals - 06/13/21 1747       PT LONG TERM GOAL #1   Title pt to report reduced cervical pain to </= 2/10 max for improvement of condition    Baseline -    Time 8    Period Weeks    Status New    Target Date  08/08/21      PT LONG TERM GOAL #2   Title pt to maintain trunk mobility in all planes with </= 2/10 max pain for improving QOL    Time 8    Period Weeks    Status New    Target Date 08/08/21      PT LONG TERM GOAL #3   Title pt to report no LLE referred symptoms for >/= 2 weeks for improvement in condition    Time 8    Period Weeks    Status New    Target Date 08/08/21      PT LONG TERM GOAL #4   Title increase FOTO to >/=55% to demo improvement in function    Time 8    Period Weeks    Status New    Target Date 08/08/21      PT LONG TERM GOAL #5   Title pt to be IND with all HEP given and is able to maintain and progress current LOF    Time 8    Period Weeks    Status New    Target Date 08/08/21                   Plan - 07/16/21 1617     Clinical Impression Statement Ellory continues to report improvement in neck/ back pain. pt opted to hold off on TPDN focusing on manual techniques for both the nec and back. continued cervical / scapular stability exercise in supine. She did well with core/ hip strengthening noting reduced neck and back pain.    PT Treatment/Interventions ADLs/Self Care Home Management;Iontophoresis 4mg /ml Dexamethasone;Moist Heat;Traction;Electrical Stimulation;Therapeutic exercise;Therapeutic activities;Balance training;Gait training;Cryotherapy;Neuromuscular re-education;Patient/family education;Manual techniques;Passive range of motion;Dry needling;Taping    PT Next Visit Plan review/ update HEP, take closer look at R SIJ / potential ant rot, response to prone extension exercise, cervical extension strength, posture education, STW    PT Home Exercise Plan [facility] - chin tuck, scapular retraction, prone on elbows, ltr, SKTC, supine marching    Consulted and Agree with Plan of  Care Patient             Patient will benefit from skilled therapeutic intervention in order to improve the following deficits and impairments:  Improper body mechanics,  Increased muscle spasms, Decreased strength, Pain, Postural dysfunction, Decreased activity tolerance, Decreased endurance  Visit Diagnosis: No diagnosis found.     Problem List Patient Active Problem List   Diagnosis Date Noted   Post-COVID syndrome 06/04/2021   Hyperthyroidism 03/29/2021   Body mass index (BMI) of 35.0-35.9 in adult 06/04/2020   History of migraine headaches 12/06/2019   Dyslipidemia 06/30/2019   GERD with esophagitis 06/30/2019   Prediabetes 10/17/2015   Spinal stenosis, lumbar region, with neurogenic claudication 10/24/2014    Lulu Riding PT, DPT, LAT, ATC  07/16/21  4:29 PM      [facility] Outpatient Rehabilitation [facility] [address] [place], [place], [address] Phone: [phone]   Fax:  [phone]  Name: CAPRICIA SERDA MRN: [id number] Date of Birth: 12-27-1965

## 2021-07-18 ENCOUNTER — Ambulatory Visit: Payer: 59 | Admitting: Physical Therapy

## 2021-07-19 ENCOUNTER — Ambulatory Visit: Payer: 59 | Admitting: Specialist

## 2021-07-23 ENCOUNTER — Encounter: Payer: Self-pay | Admitting: Physical Therapy

## 2021-07-23 ENCOUNTER — Other Ambulatory Visit: Payer: Self-pay

## 2021-07-23 ENCOUNTER — Ambulatory Visit: Payer: 59 | Admitting: Physical Therapy

## 2021-07-23 DIAGNOSIS — M545 Low back pain, unspecified: Secondary | ICD-10-CM

## 2021-07-23 DIAGNOSIS — R293 Abnormal posture: Secondary | ICD-10-CM

## 2021-07-23 DIAGNOSIS — G8929 Other chronic pain: Secondary | ICD-10-CM

## 2021-07-23 DIAGNOSIS — M542 Cervicalgia: Secondary | ICD-10-CM

## 2021-07-23 NOTE — Therapy (Signed)
[facility] [address] [place], [place], [address] Phone: [phone]   Fax:  [phone]  Physical Therapy Treatment  Patient Details  Name: Kendra Rodriguez MRN: [id number] Date of Birth: Sep 11, 1966 Referring Provider (PT): [name]   Encounter Date: 07/23/2021   PT End of Session - 07/23/21 1632     Visit Number 5    Number of Visits 17    Date for PT Re-Evaluation 08/08/21    Authorization Type [employer],    PT Start Time 1632    PT Stop Time 1715    PT Time Calculation (min) 43 min    Activity Tolerance Patient tolerated treatment well    Behavior During Therapy WFL for tasks assessed/performed             Past Medical History:  Diagnosis Date   Acute sinusitis    Allergy    Anxiety    Depression    NO MEDICATIONS - DOING OK NOW   Dizziness    Ear ache    RIGHT --HX OF MULTIPLE EAR INFECTIONS AS A CHILD    GERD (gastroesophageal reflux disease)    Headache(784.0)    MIGRAINES   Leg pain, left    with swelling   Lumbar stenosis    PAIN BACK AND LEFT LEG AND NUMBNESS LEG AND FOOT   Peripheral vascular disease (HCC)    HX OF TREATMENT FOR VARICOSE VEINS RIGHT LEG   Thyroid disease    TOLD BLOOD LEVELS SLIGHTLY ABNORMAL - BUT NOT REQUIRED MEDICATION    Past Surgical History:  Procedure Laterality Date   ABDOMINAL HYSTERECTOMY     BACK SURGERY     bladder lift     AT SAME TIME OF HYSTERECTOMY   CHOLECYSTECTOMY     LUMBAR LAMINECTOMY/DECOMPRESSION MICRODISCECTOMY Left 10/24/2014   Procedure: COMPLETE LUMBAR DECOMPRESSION L4-L5 CENTRAL AND MICRODISCECTOMY L4-L5 LEFT;  Surgeon: Jacki Cones, MD;  Location: [facility];  Service: Orthopedics;  Laterality: Left;    There were no vitals filed for this visit.   Subjective Assessment - 07/23/21 1634     Subjective "I am doing much better in both the neck and back. My R leg is more sore today. the back is 5/10 and the    Currently in Pain? Yes     Pain Score 3     Pain Location Neck    Pain Orientation Left    Pain Descriptors / Indicators Aching    Pain Type Chronic pain    Pain Onset More than a month ago    Pain Frequency Intermittent    Aggravating Factors  looking up/ down    Pain Relieving Factors heat up/down    Pain Score 5    Pain Location Back    Pain Orientation Right    Pain Descriptors / Indicators Aching    Pain Type Chronic pain    Pain Radiating Towards pain down the R into the knee down into the foot.    Pain Onset More than a month ago    Pain Frequency Intermittent    Aggravating Factors  prolonged standing    Pain Relieving Factors walking               [facility] Adult PT Treatment/Exercise:  Therapeutic Exercise: SKTC stretch 2 x 30 sec RLE only Decompression position with heels resting on red physioball  PPT 2 x 10 holding 3 sec ean (tactile cues for proper form) Rolling red physioball  to chest 2 x 20 LTR 1 x 20 Supine marching altering L/R 2 x 20 (keeping core tight) Seated on dyna-dsic Ab set x 5 holding 10 sec Pressing down with bil UE on red physioball 2 x 10  Focused on breathing out while pressing and in when relaxing Upper trap stretch 2 x 30 sec    Manual Therapy: Manual lumbar traction in decompression position C3-C7 R lateral gapping mobs grade III  Neuromuscular re-ed: N/A  Therapeutic Activity: N/A  Self Care: Adjusted pt's care seat to help keep knee and hips in alignment to help reduce pain  ITEMS NOT PERFORMED TODAY:                          PT Education - 07/23/21 1723     Education Details updated HEP today, reviewed car seat position. To follow up with her MD if the RLE symptoms continues to worsen or go immediately to the ED if she starts to experienc any new onset bowel or bladder incontinence.    Person(s) Educated Patient    Methods Explanation;Verbal cues;Handout    Comprehension Verbalized understanding;Verbal cues required               PT Short Term Goals - 07/16/21 1622       PT SHORT TERM GOAL #1   Title pt to be I with inital HEP    Period Weeks    Status Achieved      PT SHORT TERM GOAL #2   Title pt to verbalize/ demo efficient posture and lifting mechanics to reduce and prevent shoulder / back pain    Period Weeks    Status Achieved      PT SHORT TERM GOAL #3   Title increase bil hip abductor strength by at leaset 1 muscle grade.    Period Weeks    Status Achieved               PT Long Term Goals - 06/13/21 1747       PT LONG TERM GOAL #1   Title pt to report reduced cervical pain to </= 2/10 max for improvement of condition    Baseline -    Time 8    Period Weeks    Status New    Target Date 08/08/21      PT LONG TERM GOAL #2   Title pt to maintain trunk mobility in all planes with </= 2/10 max pain for improving QOL    Time 8    Period Weeks    Status New    Target Date 08/08/21      PT LONG TERM GOAL #3   Title pt to report no LLE referred symptoms for >/= 2 weeks for improvement in condition    Time 8    Period Weeks    Status New    Target Date 08/08/21      PT LONG TERM GOAL #4   Title increase FOTO to >/=55% to demo improvement in function    Time 8    Period Weeks    Status New    Target Date 08/08/21      PT LONG TERM GOAL #5   Title pt to be IND with all HEP given and is able to maintain and progress current LOF    Time 8    Period Weeks    Status New    Target Date 08/08/21  Plan - 07/23/21 1719     Clinical Impression Statement Renea reports improvement in neck and low back pain but reports pain inthe  R knee to the ankle that occurs when she is sitting most notbaly when using the restroom and bearing down. Was able to reproduce symptoms in the RLE in session with controlled valsalva manuever. She noted improvement in RLE pain when performing core activation while breathing. worked on core while in decompression position with  continued focus on flexion biased treatment. End of sesion she noted reduction in neck and back pain, and no pain in the RLE.    PT Treatment/Interventions ADLs/Self Care Home Management;Iontophoresis 4mg /ml Dexamethasone;Moist Heat;Traction;Electrical Stimulation;Therapeutic exercise;Therapeutic activities;Balance training;Gait training;Cryotherapy;Neuromuscular re-education;Patient/family education;Manual techniques;Passive range of motion;Dry needling;Taping    PT Next Visit Plan review/ update HEP, flexion biased, core strengthening, lifting mechanics keeping core tight  cervical extension strength, posture education, STW    PT Home Exercise Plan [facility] - chin tuck, scapular retraction, prone on elbows, ltr, SKTC, supine marching, ab set    Consulted and Agree with Plan of Care Patient             Patient will benefit from skilled therapeutic intervention in order to improve the following deficits and impairments:  Improper body mechanics, Increased muscle spasms, Decreased strength, Pain, Postural dysfunction, Decreased activity tolerance, Decreased endurance  Visit Diagnosis: Cervicalgia  Abnormal posture  Chronic bilateral low back pain, unspecified whether sciatica present  Chronic right shoulder pain     Problem List Patient Active Problem List   Diagnosis Date Noted   Post-COVID syndrome 06/04/2021   Hyperthyroidism 03/29/2021   Body mass index (BMI) of 35.0-35.9 in adult 06/04/2020   History of migraine headaches 12/06/2019   Dyslipidemia 06/30/2019   GERD with esophagitis 06/30/2019   Prediabetes 10/17/2015   Spinal stenosis, lumbar region, with neurogenic claudication 10/24/2014    10/26/2014 PT, DPT, LAT, ATC  07/23/21  5:27 PM     [facility] [address] [place], [place], [place] Phone: [phone]   Fax:  [phone]  Name: SHAKIERA EDELSON MRN: [name] Date of Birth: 28-May-1966

## 2021-07-25 NOTE — Anesthesia Preprocedure Evaluation (Addendum)
Anesthesia Evaluation  Patient identified by MRN, date of birth, ID band Patient awake    Reviewed: Allergy & Precautions, NPO status , Patient's Chart, lab work & pertinent test results  Airway Mallampati: II  TM Distance: >3 FB Neck ROM: Full    Dental no notable dental hx. (+) Teeth Intact, Dental Advisory Given   Pulmonary neg pulmonary ROS,    Pulmonary exam normal breath sounds clear to auscultation       Cardiovascular Exercise Tolerance: Good negative cardio ROS Normal cardiovascular exam Rhythm:Regular Rate:Normal     Neuro/Psych Anxiety negative neurological ROS  negative psych ROS   GI/Hepatic Neg liver ROS, GERD  ,  Endo/Other    Renal/GU negative Renal ROS     Musculoskeletal negative musculoskeletal ROS (+)   Abdominal   Peds  Hematology negative hematology ROS (+)   Anesthesia Other Findings   Reproductive/Obstetrics                          Anesthesia Physical Anesthesia Plan  ASA: 3  Anesthesia Plan: MAC   Post-op Pain Management:    Induction:   PONV Risk Score and Plan: Treatment may vary due to age or medical condition  Airway Management Planned: Natural Airway and Nasal Cannula  Additional Equipment: None  Intra-op Plan:   Post-operative Plan:   Informed Consent: I have reviewed the patients History and Physical, chart, labs and discussed the procedure including the risks, benefits and alternatives for the proposed anesthesia with the patient or authorized representative who has indicated his/her understanding and acceptance.     Dental advisory given and Interpreter used for interveiw  Plan Discussed with: CRNA and Anesthesiologist  Anesthesia Plan Comments: (GERD EGD  )     Anesthesia Quick Evaluation

## 2021-07-26 ENCOUNTER — Ambulatory Visit (HOSPITAL_COMMUNITY): Payer: 59 | Admitting: Anesthesiology

## 2021-07-26 ENCOUNTER — Encounter (HOSPITAL_COMMUNITY)
Admission: RE | Disposition: A | Discharge status: Home / Self Care | Payer: Self-pay | Source: Ambulatory Visit | Attending: Gastroenterology

## 2021-07-26 ENCOUNTER — Ambulatory Visit (HOSPITAL_COMMUNITY)
Admission: RE | Admit: 2021-07-26 | Discharge: 2021-07-26 | Disposition: A | Discharge status: Home / Self Care | Payer: 59 | Source: Ambulatory Visit | Attending: Gastroenterology | Admitting: Gastroenterology

## 2021-07-26 ENCOUNTER — Ambulatory Visit (HOSPITAL_COMMUNITY): Admit: 2021-07-26 | Payer: 59 | Admitting: Gastroenterology

## 2021-07-26 ENCOUNTER — Encounter (HOSPITAL_COMMUNITY): Payer: Self-pay

## 2021-07-26 ENCOUNTER — Other Ambulatory Visit: Payer: Self-pay

## 2021-07-26 DIAGNOSIS — Z9049 Acquired absence of other specified parts of digestive tract: Secondary | ICD-10-CM | POA: Insufficient documentation

## 2021-07-26 DIAGNOSIS — K317 Polyp of stomach and duodenum: Secondary | ICD-10-CM | POA: Diagnosis not present

## 2021-07-26 DIAGNOSIS — Z8249 Family history of ischemic heart disease and other diseases of the circulatory system: Secondary | ICD-10-CM | POA: Insufficient documentation

## 2021-07-26 DIAGNOSIS — Z90710 Acquired absence of both cervix and uterus: Secondary | ICD-10-CM | POA: Diagnosis not present

## 2021-07-26 DIAGNOSIS — E039 Hypothyroidism, unspecified: Secondary | ICD-10-CM | POA: Insufficient documentation

## 2021-07-26 DIAGNOSIS — K635 Polyp of colon: Secondary | ICD-10-CM | POA: Diagnosis not present

## 2021-07-26 DIAGNOSIS — K219 Gastro-esophageal reflux disease without esophagitis: Secondary | ICD-10-CM | POA: Insufficient documentation

## 2021-07-26 DIAGNOSIS — I739 Peripheral vascular disease, unspecified: Secondary | ICD-10-CM | POA: Diagnosis not present

## 2021-07-26 DIAGNOSIS — R12 Heartburn: Secondary | ICD-10-CM | POA: Diagnosis not present

## 2021-07-26 HISTORY — PX: ESOPHAGOGASTRODUODENOSCOPY (EGD) WITH PROPOFOL: SHX5813

## 2021-07-26 HISTORY — PX: POLYPECTOMY: SHX5525

## 2021-07-26 HISTORY — PX: BRAVO PH STUDY: SHX5421

## 2021-07-26 LAB — GLUCOSE, CAPILLARY: Glucose-Capillary: 106 mg/dL — ABNORMAL HIGH (ref 70–99)

## 2021-07-26 SURGERY — ESOPHAGOGASTRODUODENOSCOPY (EGD) WITH PROPOFOL
Anesthesia: Monitor Anesthesia Care

## 2021-07-26 MED ORDER — SODIUM CHLORIDE 0.9 % IV SOLN: INTRAVENOUS | Status: DC

## 2021-07-26 MED ORDER — LACTATED RINGERS IV SOLN: INTRAVENOUS | Status: DC | PRN

## 2021-07-26 MED ORDER — LIDOCAINE HCL (CARDIAC) PF 100 MG/5ML IV SOSY
PREFILLED_SYRINGE | INTRAVENOUS | Status: DC | PRN
  Administered 2021-07-26: 100 mg via INTRAVENOUS

## 2021-07-26 MED ORDER — PROPOFOL 500 MG/50ML IV EMUL
INTRAVENOUS | Status: DC | PRN
  Administered 2021-07-26: 300 ug/kg/min via INTRAVENOUS

## 2021-07-26 MED ORDER — ONDANSETRON HCL 4 MG/2ML IJ SOLN
INTRAMUSCULAR | Status: DC | PRN
  Administered 2021-07-26: 4 mg via INTRAVENOUS

## 2021-07-26 SURGICAL SUPPLY — 14 items

## 2021-07-26 NOTE — Discharge Instructions (Signed)

## 2021-07-26 NOTE — Anesthesia Postprocedure Evaluation (Signed)
Anesthesia Post Note  Patient: Kendra Rodriguez  Procedure(s) Performed: ESOPHAGOGASTRODUODENOSCOPY (EGD) WITH PROPOFOL BRAVO [place] STUDY POLYPECTOMY     Patient location during evaluation: Endoscopy Anesthesia Type: MAC Level of consciousness: awake and alert Pain management: pain level controlled Vital Signs Assessment: post-procedure vital signs reviewed and stable Respiratory status: spontaneous breathing, nonlabored ventilation, respiratory function stable and patient connected to nasal cannula oxygen Cardiovascular status: blood pressure returned to baseline and stable Postop Assessment: no apparent nausea or vomiting Anesthetic complications: no   No notable events documented.  Last Vitals:  Vitals:   07/26/21 1000 07/26/21 1010  BP: 99/62 117/67  Pulse: 66 (!) 55  Resp: 18 13  Temp:    SpO2: 95% 96%    Last Pain:  Vitals:   07/26/21 1010  TempSrc:   PainSc: 0-No pain                 Barnet Glasgow

## 2021-07-26 NOTE — Anesthesia Procedure Notes (Signed)
Procedure Name: MAC Date/Time: 07/26/2021 9:28 AM Performed by: Lissa Morales, CRNA Pre-anesthesia Checklist: Patient identified, Emergency Drugs available, Suction available, Patient being monitored and Timeout performed Patient Re-evaluated:Patient Re-evaluated prior to induction Oxygen Delivery Method: Simple face mask Preoxygenation: POM mask. Placement Confirmation: positive ETCO2

## 2021-07-26 NOTE — Op Note (Signed)
[facility] Patient Name: Kendra Rodriguez Procedure Date: 07/26/2021 MRN: [id number] Attending MD: Jeani Hawking , MD Date of Birth: 1966-04-13 CSN: [id number] Age: 55 Admit Type: Outpatient Procedure:                Upper GI endoscopy Indications:              Heartburn Providers:                Jeani Hawking, MD, Adolph Pollack, RN, Faythe Casa, RN, Leanne Lovely, Technician, Salley Scarlet, Technician, Anastasio Champion, CRNA Referring MD:              Medicines:                Propofol per Anesthesia Complications:            No immediate complications. Estimated Blood Loss:     Estimated blood loss was minimal. Procedure:                Pre-Anesthesia Assessment:                           - Prior to the procedure, a History and Physical                            was performed, and patient medications and                            allergies were reviewed. The patient's tolerance of                            previous anesthesia was also reviewed. The risks                            and benefits of the procedure and the sedation                            options and risks were discussed with the patient.                            All questions were answered, and informed consent                            was obtained. Prior Anticoagulants: The patient has                            taken no previous anticoagulant or antiplatelet                            agents. ASA Grade Assessment: II - A patient with  mild systemic disease. After reviewing the risks                            and benefits, the patient was deemed in                            satisfactory condition to undergo the procedure.                           - Sedation was administered by an anesthesia                            professional. Deep sedation was attained.                           After obtaining informed  consent, the endoscope was                            passed under direct vision. Throughout the                            procedure, the patient's blood pressure, pulse, and                            oxygen saturations were monitored continuously. The                            [id number]-H190 ([id number]) Olympus endoscope was introduced                            through the mouth, and advanced to the second part                            of duodenum. The upper GI endoscopy was                            accomplished without difficulty. The patient                            tolerated the procedure well. Scope In: Scope Out: Findings:      The examined esophagus was normal. The BRAVO capsule with delivery       system was introduced through the mouth and advanced into the esophagus,       such that the BRAVO pH capsule was positioned 32 cm from the incisors,       which was 6 cm proximal to the GE junction. The BRAVO pH capsule was       then deployed and attached to the esophageal mucosa. The delivery system       was then withdrawn. Endoscopy was utilized for probe placement and       diagnostic evaluation.      Four 4 mm pedunculated polyps with no bleeding and no stigmata of recent       bleeding were found in the gastric body. The polyp was removed with a       cold  snare. Resection and retrieval were complete.      The examined duodenum was normal.      The Z-line was measured at 38 cm and the BRAVO probe was placed at 32       cm. The endoscope was reinserted to confirm successful placement of the       probe. Impression:               - Normal esophagus.                           - Four gastric polyps. Resected and retrieved.                           - Normal examined duodenum.                           - The BRAVO pH capsule was deployed. Moderate Sedation:      Not Applicable - Patient had care per Anesthesia. Recommendation:           - Patient has a contact number available  for                            emergencies. The signs and symptoms of potential                            delayed complications were discussed with the                            patient. Return to normal activities tomorrow.                            Written discharge instructions were provided to the                            patient.                           - Resume previous diet.                           - Continue present medications.                           - Await pathology results.                           - Follow up with Dr. Loreta Ave in one month. Procedure Code(s):        --- Professional ---                           [phone], Esophagogastroduodenoscopy, flexible,                            transoral; with removal of tumor(s), polyp(s), or                            other lesion(s)  by snare technique Diagnosis Code(s):        --- Professional ---                           K31.7, Polyp of stomach and duodenum                           R12, Heartburn CPT copyright 2019 American [employer] Association. All rights reserved. The codes documented in this report are preliminary and upon coder review may  be revised to meet current compliance requirements. Jeani Hawking, MD Jeani Hawking, MD 07/26/2021 9:51:41 AM This report has been signed electronically. Number of Addenda: 0

## 2021-07-26 NOTE — Transfer of Care (Signed)
Immediate Anesthesia Transfer of Care Note  Patient: Kendra Rodriguez  Procedure(s) Performed: ESOPHAGOGASTRODUODENOSCOPY (EGD) WITH PROPOFOL BRAVO [place] STUDY POLYPECTOMY  Patient Location: PACU  Anesthesia Type:MAC  Level of Consciousness: awake, alert  and patient cooperative  Airway & Oxygen Therapy: Patient Spontanous Breathing and Patient connected to face mask oxygen  Post-op Assessment: Report given to RN, Post -op Vital signs reviewed and stable and Patient moving all extremities X 4  Post vital signs: stable  Last Vitals:  Vitals Value Taken Time  BP 117/67 07/26/21 1011  Temp    Pulse 57 07/26/21 1012  Resp 14 07/26/21 1012  SpO2 96 % 07/26/21 1012  Vitals shown include unvalidated device data.  Last Pain:  Vitals:   07/26/21 1010  TempSrc:   PainSc: 0-No pain         Complications: No notable events documented.

## 2021-07-26 NOTE — H&P (Signed)
Kendra Rodriguez HPI: This 55 year old Hispanic female presents to the office for evaluation of worsening acid reflux, retrosternal burning, coughing and difficulty swallowing. She denies any NSAID use. She is taking Pantoprazole 40 mg a day and Famotidine 40 mg 2 in the evening but still continues to have breakthrough symptoms. She has rectrosternal pain and burning with acid regurgitating up into her throat. She has difficulty swallowing solids and even occasional with water. She used to be on Dexilant which worked very well for her but because of her change in her insurance to [employer] health the Dexilant is no longer covered and therefore she has to take pantoprazole she has 1 BM per day with no obvious blood or mucus in the stool. She also describes a burning sensation in her rectum which she feels is caused by the acid. She does not speak any English and most of the history been procured by her daughter who is interpreting for her. She has occasional nausea. She has good appetite and her weight has been stable. She denies having any complaints of vomiting or odynophagia. She denies having a family history of colon cancer, celiac sprue or IBD. Her last colonoscopy done on 03/14/2019 revealed internal hemorrhoids, an inflammatory type polyp was removed from the ascending colon and random biopsies were benign with no evidence of colitis.  Past Medical History:  Diagnosis Date   Acute sinusitis    Allergy    Anxiety    Depression    NO MEDICATIONS - DOING OK NOW   Dizziness    Ear ache    RIGHT --HX OF MULTIPLE EAR INFECTIONS AS A CHILD    GERD (gastroesophageal reflux disease)    Headache(784.0)    MIGRAINES   Leg pain, left    with swelling   Lumbar stenosis    PAIN BACK AND LEFT LEG AND NUMBNESS LEG AND FOOT   Peripheral vascular disease (HCC)    HX OF TREATMENT FOR VARICOSE VEINS RIGHT LEG   Thyroid disease    TOLD BLOOD LEVELS SLIGHTLY ABNORMAL - BUT NOT REQUIRED MEDICATION    Past  Surgical History:  Procedure Laterality Date   ABDOMINAL HYSTERECTOMY     BACK SURGERY     bladder lift     AT SAME TIME OF HYSTERECTOMY   CHOLECYSTECTOMY     LUMBAR LAMINECTOMY/DECOMPRESSION MICRODISCECTOMY Left 10/24/2014   Procedure: COMPLETE LUMBAR DECOMPRESSION L4-L5 CENTRAL AND MICRODISCECTOMY L4-L5 LEFT;  Surgeon: Jacki Cones, MD;  Location: [facility];  Service: Orthopedics;  Laterality: Left;    Family History  Problem Relation Age of Onset   Hypertension Mother    Arthritis Mother    Diabetes Mother    Hyperlipidemia Mother    Arthritis Sister    Rheum arthritis Brother    Autoimmune disease Brother    Lung disease Brother    Arthritis Sister    Gout Son    High Cholesterol Daughter    Thyroid disease Neg Hx     Social History:  reports that she has never smoked. She has never used smokeless tobacco. She reports that she does not drink alcohol and does not use drugs.  Allergies: No Known Allergies  Medications: Scheduled: Continuous:  sodium chloride      No results found for this or any previous visit (from the past 24 hour(s)).   No results found.  ROS:  As stated above in the HPI otherwise negative.  There were no vitals taken for this visit.  PE: Gen: NAD, Alert and Oriented HEENT:  [place]/AT, EOMI Neck: Supple, no LAD Lungs: CTA Bilaterally CV: RRR without M/G/R ABD: Soft, NTND, +BS Ext: No C/C/E  Assessment/Plan: 1) GERD - EGD with BRAVO.  Kendra Rodriguez D 07/26/2021, 8:24 AM

## 2021-07-29 ENCOUNTER — Encounter (HOSPITAL_COMMUNITY): Payer: Self-pay | Admitting: Gastroenterology

## 2021-07-29 LAB — SURGICAL PATHOLOGY

## 2021-07-30 ENCOUNTER — Ambulatory Visit: Payer: 59 | Admitting: Physical Therapy

## 2021-07-31 ENCOUNTER — Other Ambulatory Visit: Payer: Self-pay | Admitting: Gastroenterology

## 2021-07-31 ENCOUNTER — Ambulatory Visit
Admission: RE | Admit: 2021-07-31 | Discharge: 2021-07-31 | Disposition: A | Discharge status: Home / Self Care | Payer: 59 | Source: Ambulatory Visit | Attending: Gastroenterology | Admitting: Gastroenterology

## 2021-07-31 DIAGNOSIS — Z9889 Other specified postprocedural states: Secondary | ICD-10-CM

## 2021-08-02 ENCOUNTER — Ambulatory Visit: Payer: 59 | Admitting: Physical Therapy

## 2021-08-07 ENCOUNTER — Encounter (HOSPITAL_COMMUNITY): Payer: Self-pay | Admitting: Gastroenterology

## 2021-08-07 ENCOUNTER — Other Ambulatory Visit: Payer: Self-pay

## 2021-08-07 ENCOUNTER — Ambulatory Visit: Payer: 59 | Attending: Specialist | Admitting: Physical Therapy

## 2021-08-07 ENCOUNTER — Encounter: Payer: Self-pay | Admitting: Physical Therapy

## 2021-08-07 DIAGNOSIS — M545 Low back pain, unspecified: Secondary | ICD-10-CM | POA: Diagnosis present

## 2021-08-07 DIAGNOSIS — G8929 Other chronic pain: Secondary | ICD-10-CM | POA: Diagnosis present

## 2021-08-07 DIAGNOSIS — M542 Cervicalgia: Secondary | ICD-10-CM | POA: Insufficient documentation

## 2021-08-07 DIAGNOSIS — R293 Abnormal posture: Secondary | ICD-10-CM | POA: Insufficient documentation

## 2021-08-07 NOTE — Therapy (Signed)
[facility] [address] [place], [place], [address] Phone: [phone]   Fax:  [phone]  Physical Therapy Treatment  Patient Details  Name: Kendra Rodriguez MRN: [id number] Date of Birth: December 06, 1965 Referring Provider (PT): [name]   Encounter Date: 08/07/2021   PT End of Session - 08/07/21 1547     Visit Number 6    Number of Visits 17    Date for PT Re-Evaluation 09/18/21    Authorization Type [employer],    PT Start Time 1547    PT Stop Time 1630    PT Time Calculation (min) 43 min    Activity Tolerance Patient tolerated treatment well    Behavior During Therapy WFL for tasks assessed/performed             Past Medical History:  Diagnosis Date   Acute sinusitis    Allergy    Anxiety    Depression    NO MEDICATIONS - DOING OK NOW   Dizziness    Ear ache    RIGHT --HX OF MULTIPLE EAR INFECTIONS AS A CHILD    GERD (gastroesophageal reflux disease)    Headache(784.0)    MIGRAINES   Leg pain, left    with swelling   Lumbar stenosis    PAIN BACK AND LEFT LEG AND NUMBNESS LEG AND FOOT   Peripheral vascular disease (HCC)    HX OF TREATMENT FOR VARICOSE VEINS RIGHT LEG   Thyroid disease    TOLD BLOOD LEVELS SLIGHTLY ABNORMAL - BUT NOT REQUIRED MEDICATION    Past Surgical History:  Procedure Laterality Date   ABDOMINAL HYSTERECTOMY     BACK SURGERY     bladder lift     AT SAME TIME OF HYSTERECTOMY   BRAVO PH STUDY N/A 07/26/2021   Procedure: BRAVO PH STUDY;  Surgeon: Jeani Hawking, MD;  Location: [facility];  Service: Endoscopy;  Laterality: N/A;   CHOLECYSTECTOMY     ESOPHAGOGASTRODUODENOSCOPY (EGD) WITH PROPOFOL N/A 07/26/2021   Procedure: ESOPHAGOGASTRODUODENOSCOPY (EGD) WITH PROPOFOL;  Surgeon: Jeani Hawking, MD;  Location: [facility];  Service: Endoscopy;  Laterality: N/A;   LUMBAR LAMINECTOMY/DECOMPRESSION MICRODISCECTOMY Left 10/24/2014   Procedure: COMPLETE LUMBAR DECOMPRESSION L4-L5  CENTRAL AND MICRODISCECTOMY L4-L5 LEFT;  Surgeon: Jacki Cones, MD;  Location: [facility];  Service: Orthopedics;  Laterality: Left;   POLYPECTOMY  07/26/2021   Procedure: POLYPECTOMY;  Surgeon: Jeani Hawking, MD;  Location: [facility];  Service: Endoscopy;;    There were no vitals filed for this visit.   Subjective Assessment - 08/07/21 1547     Subjective " the back is doing better in the back and I am no longer having the issue down the leg. The neck is still feeling    Currently in Pain? Yes    Pain Score 4     Pain Location Neck    Pain Orientation Left    Pain Descriptors / Indicators Aching    Pain Onset More than a month ago    Pain Frequency Intermittent    Aggravating Factors  looking down    Pain Relieving Factors heat in the shower    Pain Score 3    Pain Location Back    Pain Orientation Right    Pain Descriptors / Indicators Aching    Aggravating Factors  doing too much    Pain Relieving Factors walking/ standing                [facility] PT Assessment -  08/07/21 0001       Assessment   Medical Diagnosis Spondylolisthesis, lumbar region (M43.16), Tendonitis of both shoulders (M77.8)    Referring Provider (PT) [name]      Observation/Other Assessments   Focus on Therapeutic Outcomes (FOTO)  49%                        [facility] Adult PT Treatment/Exercise:  Therapeutic Exercise: Supine chin tuck head lift 2  x 10 hoding 10 sec Upper trap stretch on the R 2 x 30 sec Supine marching 2 x 20 L/R with BTB Upper cervical rotation with chin tucked 2 x 10  Manual Therapy: MTPR along R upper trap / levator scapulae Sub-occipital stretch 2 x 30 sec C3-C7 PA grade III R lateral gapping C3-C7  Neuromuscular re-ed: N/A  Therapeutic Activity: N/A  Modalities: N/A  Self Care: Review posture and lifting mechanics and provided handout   Consider / progression for next session:             PT Education - 08/07/21 1625      Education Details reviewed her HEP and her progress since her initial evalautiong. updated HEP today to include upper cervical mobility. reviewed posture and lifting mechanics.    Person(s) Educated Patient    Methods Explanation;Verbal cues;Handout    Comprehension Verbalized understanding;Verbal cues required              PT Short Term Goals - 08/07/21 1559       PT SHORT TERM GOAL #1   Title pt to be I with inital HEP    Status Achieved      PT SHORT TERM GOAL #2   Title pt to verbalize/ demo efficient posture and lifting mechanics to reduce and prevent shoulder / back pain    Status Achieved      PT SHORT TERM GOAL #3   Title increase bil hip abductor strength by at leaset 1 muscle grade.    Status Achieved               PT Long Term Goals - 08/07/21 1559       PT LONG TERM GOAL #1   Title pt to report reduced cervical pain to </= 2/10 max for improvement of condition    Time 6    Period Weeks    Status On-going    Target Date 09/18/21      PT LONG TERM GOAL #2   Title pt to maintain trunk mobility in all planes with </= 2/10 max pain for improving QOL    Baseline today 3/10 paoin    Time 6    Period Weeks    Status On-going    Target Date 09/18/21      PT LONG TERM GOAL #3   Title pt to report no LLE referred symptoms for >/= 2 weeks for improvement in condition    Status Achieved      PT LONG TERM GOAL #4   Title increase FOTO to >/=55% to demo improvement in function    Time 6    Period Weeks    Status On-going    Target Date 09/18/21      PT LONG TERM GOAL #5   Title pt to be IND with all HEP given and is able to maintain and progress current LOF    Time 6    Period Weeks    Status On-going    Target  Date 09/18/21                   Plan - 08/07/21 1628     Clinical Impression Statement Kendra Rodriguez continues to make good progress with physical therapy reporting a reduction in neck and back pain and additionally reports no RLE referred  pain. She does continue to report intermittent pain that depends on what she does that day. Reviewed her FOTO score which improved to 49%, and her goals which she is making good progress toward. She responded well to cervical mobility and continued core activation, time was taken to review posture and lifting mechanics. she would benefit from continued physical therapy to decrease neck / back pain, promote efficient posture and lifting mechanics, and maximize her function by addressing the deficits listed.    PT Frequency 2x / week    PT Duration 6 weeks    PT Treatment/Interventions ADLs/Self Care Home Management;Iontophoresis 4mg /ml Dexamethasone;Moist Heat;Traction;Electrical Stimulation;Therapeutic exercise;Therapeutic activities;Balance training;Gait training;Cryotherapy;Neuromuscular re-education;Patient/family education;Manual techniques;Passive range of motion;Dry needling;Taping    PT Next Visit Plan review/ update HEP, flexion biased, core strengthening, lifting mechanics keeping core tight  cervical extension strength, posture education, STW    PT Home Exercise Plan [facility] - chin tuck, scapular retraction, prone on elbows, ltr, SKTC, supine marching, ab set    Consulted and Agree with Plan of Care Patient             Patient will benefit from skilled therapeutic intervention in order to improve the following deficits and impairments:  Improper body mechanics, Increased muscle spasms, Decreased strength, Pain, Postural dysfunction, Decreased activity tolerance, Decreased endurance  Visit Diagnosis: Cervicalgia  Abnormal posture  Chronic bilateral low back pain, unspecified whether sciatica present     Problem List Patient Active Problem List   Diagnosis Date Noted   Post-COVID syndrome 06/04/2021   Hyperthyroidism 03/29/2021   Body mass index (BMI) of 35.0-35.9 in adult 06/04/2020   History of migraine headaches 12/06/2019   Dyslipidemia 06/30/2019   GERD with  esophagitis 06/30/2019   Prediabetes 10/17/2015   Spinal stenosis, lumbar region, with neurogenic claudication 10/24/2014   10/26/2014 PT, DPT, LAT, ATC  08/07/21  4:32 PM     [facility] [address] [place], [place], [place] Phone: [phone]   Fax:  [phone]  Name: Kendra Rodriguez MRN: [name] Date of Birth: Jun 07, 1966

## 2021-08-07 NOTE — Patient Instructions (Addendum)

## 2021-08-13 ENCOUNTER — Ambulatory Visit: Payer: 59 | Admitting: Physical Therapy

## 2021-08-21 ENCOUNTER — Encounter: Payer: Self-pay | Admitting: Specialist

## 2021-08-21 ENCOUNTER — Ambulatory Visit (INDEPENDENT_AMBULATORY_CARE_PROVIDER_SITE_OTHER): Payer: 59 | Admitting: Specialist

## 2021-08-21 ENCOUNTER — Other Ambulatory Visit: Payer: Self-pay

## 2021-08-21 VITALS — BP 105/62 | HR 61 | Ht 63.0 in | Wt 189.0 lb

## 2021-08-21 DIAGNOSIS — G5603 Carpal tunnel syndrome, bilateral upper limbs: Secondary | ICD-10-CM | POA: Diagnosis not present

## 2021-08-21 DIAGNOSIS — M542 Cervicalgia: Secondary | ICD-10-CM | POA: Diagnosis not present

## 2021-08-21 DIAGNOSIS — M4316 Spondylolisthesis, lumbar region: Secondary | ICD-10-CM | POA: Diagnosis not present

## 2021-08-21 NOTE — Patient Instructions (Signed)
Avoid bending, stooping and avoid lifting weights greater than 10 lbs. Avoid prolong standing and walking. Avoid frequent bending and stooping  No lifting greater than 10 lbs. May use ice or moist heat for pain. Weight loss is of benefit. Handicap license is approved. Physical Therapy for lumbar spondylosis associated with DDD and spondylolisthesis L4-5 not interested in injection or surgical treatments, functioning at a high level so not really a good surgical candidate, eventually may need intervention.

## 2021-08-21 NOTE — Progress Notes (Signed)
Office Visit Note   Patient: Kendra Rodriguez           Date of Birth: 14-Sep-1966           MRN: [id number] Visit Date: 08/21/2021              Requested by: [name], MD [address] [place],  [place] [address] PCP: [name], MD   Assessment & Plan: Visit Diagnoses:  1. Spondylolisthesis, lumbar region   2. Cervicalgia   3. Bilateral carpal tunnel syndrome     Plan: Avoid bending, stooping and avoid lifting weights greater than 10 lbs. Avoid prolong standing and walking. Avoid frequent bending and stooping  No lifting greater than 10 lbs. May use ice or moist heat for pain. Weight loss is of benefit. Handicap license is approved. Physical Therapy for lumbar spondylosis associated with DDD and spondylolisthesis L4-5 not interested in injection or surgical treatments, functioning at a high level so not really a good surgical candidate, eventually may need intervention.    Follow-Up Instructions: Return in about 2 months (around 10/21/2021).   Orders:  No orders of the defined types were placed in this encounter.  No orders of the defined types were placed in this encounter.     Procedures: No procedures performed   Clinical Data: No additional findings.   Subjective: Chief Complaint  Patient presents with   Neck - Pain, Follow-up   Lower Back - Pain, Follow-up    55 year old female with low back pain and neck pain. Has been to PT and therapy last saw her 10/5. She reports no PT since and is not sure if more PT is to be done. Reviewed note from 10/5 at [facility] out patient PT and  She reports neck is better overall but still having low back and bilateral anterior shin pain and numbness and tingling. Plain radiographs with anterolisthesis L4-5 and retrolisthesis at L3-4. No bowel or bladder discomfort. She has been out of work since undergoing surgery.   Review of Systems   Objective: Vital Signs: BP 105/62 (BP Location: Left Arm,  Patient Position: Sitting, Cuff Size: Normal)   Pulse 61   Ht 5\' 3"  (1.6 m)   Wt 189 lb (85.7 kg)   BMI 33.48 kg/m   Physical Exam  Ortho Exam  Specialty Comments:  No specialty comments available.  Imaging: No results found.   PMFS History: Patient Active Problem List   Diagnosis Date Noted   Post-COVID syndrome 06/04/2021   Hyperthyroidism 03/29/2021   Body mass index (BMI) of 35.0-35.9 in adult 06/04/2020   History of migraine headaches 12/06/2019   Dyslipidemia 06/30/2019   GERD with esophagitis 06/30/2019   Prediabetes 10/17/2015   Spinal stenosis, lumbar region, with neurogenic claudication 10/24/2014   Past Medical History:  Diagnosis Date   Acute sinusitis    Allergy    Anxiety    Depression    NO MEDICATIONS - DOING OK NOW   Dizziness    Ear ache    RIGHT --HX OF MULTIPLE EAR INFECTIONS AS A CHILD    GERD (gastroesophageal reflux disease)    Headache(784.0)    MIGRAINES   Leg pain, left    with swelling   Lumbar stenosis    PAIN BACK AND LEFT LEG AND NUMBNESS LEG AND FOOT   Peripheral vascular disease (HCC)    HX OF TREATMENT FOR VARICOSE VEINS RIGHT LEG   Thyroid disease    TOLD BLOOD LEVELS  SLIGHTLY ABNORMAL - BUT NOT REQUIRED MEDICATION    Family History  Problem Relation Age of Onset   Hypertension Mother    Arthritis Mother    Diabetes Mother    Hyperlipidemia Mother    Arthritis Sister    Rheum arthritis Brother    Autoimmune disease Brother    Lung disease Brother    Arthritis Sister    Gout Son    High Cholesterol Daughter    Thyroid disease Neg Hx     Past Surgical History:  Procedure Laterality Date   ABDOMINAL HYSTERECTOMY     BACK SURGERY     bladder lift     AT SAME TIME OF HYSTERECTOMY   BRAVO PH STUDY N/A 07/26/2021   Procedure: BRAVO PH STUDY;  Surgeon: Jeani Hawking, MD;  [facility];  Service: Endoscopy;  Laterality: N/A;   CHOLECYSTECTOMY     ESOPHAGOGASTRODUODENOSCOPY (EGD) WITH PROPOFOL N/A 07/26/2021    Procedure: ESOPHAGOGASTRODUODENOSCOPY (EGD) WITH PROPOFOL;  Surgeon: Jeani Hawking, MD;  [facility];  Service: Endoscopy;  Laterality: N/A;   LUMBAR LAMINECTOMY/DECOMPRESSION MICRODISCECTOMY Left 10/24/2014   Procedure: COMPLETE LUMBAR DECOMPRESSION L4-L5 CENTRAL AND MICRODISCECTOMY L4-L5 LEFT;  Surgeon: Jacki Cones, MD;  [facility]: [facility];  Service: Orthopedics;  Laterality: Left;   POLYPECTOMY  07/26/2021   Procedure: POLYPECTOMY;  Surgeon: Jeani Hawking, MD;  [facility]: [facility];  Service: Endoscopy;;   Social History   Occupational History   Not on file  Tobacco Use   Smoking status: Never   Smokeless tobacco: Never  Vaping Use   Vaping Use: Never used  Substance and Sexual Activity   Alcohol use: No   Drug use: No   Sexual activity: Not on file

## 2021-08-28 ENCOUNTER — Ambulatory Visit (INDEPENDENT_AMBULATORY_CARE_PROVIDER_SITE_OTHER): Payer: 59 | Admitting: Endocrinology

## 2021-08-28 ENCOUNTER — Other Ambulatory Visit: Payer: Self-pay

## 2021-08-28 VITALS — BP 124/82 | HR 76 | Ht 63.0 in | Wt 190.2 lb

## 2021-08-28 DIAGNOSIS — R7303 Prediabetes: Secondary | ICD-10-CM

## 2021-08-28 DIAGNOSIS — E0590 Thyrotoxicosis, unspecified without thyrotoxic crisis or storm: Secondary | ICD-10-CM | POA: Diagnosis not present

## 2021-08-28 NOTE — Patient Instructions (Addendum)
Blood tests are requested for you today.  We'll let you know about the results.  I have sent a prescription to your pharmacy, to change the metformin to extended-release, to see if this helps your stomach symptoms.  Please come back for a follow-up appointment in 4 months.    Se solicitan anlisis de sangre para usted hoy. Te informaremos [employer]. He enviado una receta a su farmacia para cambiar la metformina a liberacin prolongada, para ver si esto ayuda con los sntomas estomacales. Vuelva para una cita de seguimiento en 4 meses.

## 2021-08-28 NOTE — Progress Notes (Signed)
Subjective:    Patient ID: Kendra Rodriguez, female    DOB: 04/15/1966, 55 y.o.   MRN: [id number]  HPI Pt returns for f/u of subacute thyroiditis (dx'ed 2022, when she presented with hyperthyroidism; she spontaneously developed hypothyroidism, and was rx'ed synthroid since rx; she has never had thyroid imaging).  pt states she feels better in general since COVID illness.   She takes metformin for hyperglycemia since 2019.   Past Medical History:  Diagnosis Date   Acute sinusitis    Allergy    Anxiety    Depression    NO MEDICATIONS - DOING OK NOW   Dizziness    Ear ache    RIGHT --HX OF MULTIPLE EAR INFECTIONS AS A CHILD    GERD (gastroesophageal reflux disease)    Headache(784.0)    MIGRAINES   Leg pain, left    with swelling   Lumbar stenosis    PAIN BACK AND LEFT LEG AND NUMBNESS LEG AND FOOT   Peripheral vascular disease (HCC)    HX OF TREATMENT FOR VARICOSE VEINS RIGHT LEG   Thyroid disease    TOLD BLOOD LEVELS SLIGHTLY ABNORMAL - BUT NOT REQUIRED MEDICATION    Past Surgical History:  Procedure Laterality Date   ABDOMINAL HYSTERECTOMY     BACK SURGERY     bladder lift     AT SAME TIME OF HYSTERECTOMY   BRAVO PH STUDY N/A 07/26/2021   Procedure: BRAVO PH STUDY;  Surgeon: Jeani Hawking, MD;  [facility];  Service: Endoscopy;  Laterality: N/A;   CHOLECYSTECTOMY     ESOPHAGOGASTRODUODENOSCOPY (EGD) WITH PROPOFOL N/A 07/26/2021   Procedure: ESOPHAGOGASTRODUODENOSCOPY (EGD) WITH PROPOFOL;  Surgeon: Jeani Hawking, MD;  [facility];  Service: Endoscopy;  Laterality: N/A;   LUMBAR LAMINECTOMY/DECOMPRESSION MICRODISCECTOMY Left 10/24/2014   Procedure: COMPLETE LUMBAR DECOMPRESSION L4-L5 CENTRAL AND MICRODISCECTOMY L4-L5 LEFT;  Surgeon: Jacki Cones, MD;  [facility];  Service: Orthopedics;  Laterality: Left;   POLYPECTOMY  07/26/2021   Procedure: POLYPECTOMY;  Surgeon: Jeani Hawking, MD;  [facility];  Service: Endoscopy;;    Social  History   Socioeconomic History   Marital status: Married    Spouse name: Not on file   Number of children: Not on file   Years of education: Not on file   Highest education level: Not on file  Occupational History   Not on file  Tobacco Use   Smoking status: Never   Smokeless tobacco: Never  Vaping Use   Vaping Use: Never used  Substance and Sexual Activity   Alcohol use: No   Drug use: No   Sexual activity: Not on file  Other Topics Concern   Not on file  Social History Narrative   ** Merged History Encounter **       Social Determinants of Health   Financial Resource Strain: Not on file  Food Insecurity: Not on file  Transportation Needs: Not on file  Physical Activity: Not on file  Stress: Not on file  Social Connections: Not on file  Intimate Partner Violence: Not on file    Current Outpatient Medications on File Prior to Visit  Medication Sig Dispense Refill   acetaminophen (TYLENOL) 500 MG tablet Take 1,000 mg by mouth every 6 (six) hours as needed for headache.     atorvastatin (LIPITOR) 40 MG tablet TAKE 1 TABLET(40 MG) BY MOUTH DAILY 90 tablet 3   cetirizine (ZYRTEC ALLERGY) 10 MG tablet Take 1 tablet (10 mg total) by  mouth daily. (Patient taking differently: Take 10 mg by mouth daily as needed for allergies.) 30 tablet 0   cholecalciferol (VITAMIN D3) 25 MCG (1000 UNIT) tablet Take 1,000 Units by mouth daily.     diclofenac Sodium (VOLTAREN) 1 % GEL Apply 2 g topically 4 (four) times daily. (Patient taking differently: Apply 2 g topically 4 (four) times daily as needed (pain).) 350 g 3   famotidine (PEPCID) 40 MG tablet Take 40 mg by mouth 2 (two) times daily.     fluticasone (FLONASE) 50 MCG/ACT nasal spray Place 2 sprays into both nostrils daily. (Patient taking differently: Place 2 sprays into both nostrils daily as needed for allergies.) 16 mL 0   levothyroxine (SYNTHROID) 50 MCG tablet Take 1 tablet (50 mcg total) by mouth daily. 90 tablet 3   metFORMIN  (GLUCOPHAGE-XR) 500 MG 24 hr tablet Take 3 tablets (1,500 mg total) by mouth daily. 270 tablet 3   pantoprazole (PROTONIX) 40 MG tablet Take 40 mg by mouth daily before breakfast.     XIIDRA 5 % SOLN Place 1 drop into both eyes 2 (two) times daily.     No current facility-administered medications on file prior to visit.    No Active Allergies  Family History  Problem Relation Age of Onset   Hypertension Mother    Arthritis Mother    Diabetes Mother    Hyperlipidemia Mother    Arthritis Sister    Rheum arthritis Brother    Autoimmune disease Brother    Lung disease Brother    Arthritis Sister    Gout Son    High Cholesterol Daughter    Thyroid disease Neg Hx     BP 124/82 (BP Location: Right Arm, Patient Position: Sitting, Cuff Size: Normal)   Pulse 76   Ht 5\' 3"  (1.6 m)   Wt 190 lb 3.2 oz (86.3 kg)   SpO2 97%   BMI 33.69 kg/m    Review of Systems Denies fever    Objective:   Physical Exam NECK: thyroid is slightly and diffusely enlarged.     Lab Results  Component Value Date   HGBA1C 6.0 08/28/2021   Lab Results  Component Value Date   TSH 0.82 08/28/2021      Assessment & Plan:  Hyperglycemia: well-controlled.  Please continue the same metformin. Hypothyroidism: well-controlled.  Please continue the same synthroid.

## 2021-08-29 LAB — T4, FREE: Free T4: 0.99 ng/dL (ref 0.60–1.60)

## 2021-08-29 LAB — HEMOGLOBIN A1C: Hgb A1c MFr Bld: 6 % (ref 4.6–6.5)

## 2021-08-29 LAB — TSH: TSH: 0.82 u[IU]/mL (ref 0.35–5.50)

## 2021-09-03 ENCOUNTER — Encounter: Payer: Self-pay | Admitting: Internal Medicine

## 2021-09-03 ENCOUNTER — Other Ambulatory Visit: Payer: Self-pay

## 2021-09-03 ENCOUNTER — Ambulatory Visit (INDEPENDENT_AMBULATORY_CARE_PROVIDER_SITE_OTHER): Payer: 59 | Admitting: Internal Medicine

## 2021-09-03 VITALS — BP 120/80 | HR 60 | Temp 98.3°F | Ht 63.0 in | Wt 188.0 lb

## 2021-09-03 DIAGNOSIS — G43819 Other migraine, intractable, without status migrainosus: Secondary | ICD-10-CM

## 2021-09-03 DIAGNOSIS — G43109 Migraine with aura, not intractable, without status migrainosus: Secondary | ICD-10-CM

## 2021-09-03 MED ORDER — ONDANSETRON HCL 4 MG PO TABS: 4.0000 mg | ORAL_TABLET | Freq: Three times a day (TID) | ORAL | 3 refills | Status: DC | PRN

## 2021-09-03 MED ORDER — ZOLMITRIPTAN 5 MG PO TABS: ORAL_TABLET | ORAL | 5 refills | Status: DC

## 2021-09-03 MED ORDER — DIAZEPAM 5 MG PO TABS: ORAL_TABLET | ORAL | 0 refills | Status: DC

## 2021-09-03 NOTE — Progress Notes (Signed)
Subjective:    Patient ID: Kendra Rodriguez, female    DOB: [birth year], 55 y.o.   MRN: [id number]  This visit occurred during the SARS-CoV-2 public health emergency.  Safety protocols were in place, including screening questions prior to the visit, additional usage of staff PPE, and extensive cleaning of exam room while observing appropriate contact time as indicated for disinfecting solutions.    HPI The patient is here for an acute visit.  She is here some a family member than translates for her.     She is always had some headaches, but over the past 3 years she has had more severe headaches and at one point was diagnosed with possible migraines.  She was prescribed Imitrex 50 mg to try.  She states that did help, but did not take away the headache completely.  It did cause some stomach upset.  With her headaches that are typically on the left side of her head she has aura, blurry vision, dizziness, photosensitivity, nausea and weakness in her legs.  The headache will linger after some of the other symptoms go away.  She is having at least 1 headache a week, occasionally more than 1.  She does have some stress and is menopausal.  She went through menopause around 33.  She is unsure of any triggers.  She denies any known family history.       Medications and allergies reviewed with patient and updated if appropriate.  Patient Active Problem List   Diagnosis Date Noted   Post-COVID syndrome 06/04/2021   Hyperthyroidism 03/29/2021   Body mass index (BMI) of 35.0-35.9 in adult 06/04/2020   History of migraine headaches 12/06/2019   Dyslipidemia 06/30/2019   GERD with esophagitis 06/30/2019   Prediabetes 10/17/2015   Spinal stenosis, lumbar region, with neurogenic claudication 10/24/2014    Current Outpatient Medications on File Prior to Visit  Medication Sig Dispense Refill   acetaminophen (TYLENOL) 500 MG tablet Take 1,000 mg by mouth every 6 (six) hours as needed for  headache.     atorvastatin (LIPITOR) 40 MG tablet TAKE 1 TABLET(40 MG) BY MOUTH DAILY 90 tablet 3   cetirizine (ZYRTEC ALLERGY) 10 MG tablet Take 1 tablet (10 mg total) by mouth daily. (Patient taking differently: Take 10 mg by mouth daily as needed for allergies.) 30 tablet 0   cholecalciferol (VITAMIN D3) 25 MCG (1000 UNIT) tablet Take 1,000 Units by mouth daily.     diclofenac Sodium (VOLTAREN) 1 % GEL Apply 2 g topically 4 (four) times daily. (Patient taking differently: Apply 2 g topically 4 (four) times daily as needed (pain).) 350 g 3   famotidine (PEPCID) 40 MG tablet Take 40 mg by mouth 2 (two) times daily.     fluticasone (FLONASE) 50 MCG/ACT nasal spray Place 2 sprays into both nostrils daily. (Patient taking differently: Place 2 sprays into both nostrils daily as needed for allergies.) 16 mL 0   levothyroxine (SYNTHROID) 50 MCG tablet Take 1 tablet (50 mcg total) by mouth daily. 90 tablet 3   metFORMIN (GLUCOPHAGE-XR) 500 MG 24 hr tablet Take 3 tablets (1,500 mg total) by mouth daily. 270 tablet 3   pantoprazole (PROTONIX) 40 MG tablet Take 40 mg by mouth daily before breakfast.     sucralfate (CARAFATE) 1 g tablet Take 1 g by mouth 3 (three) times daily.     XIIDRA 5 % SOLN Place 1 drop into both eyes 2 (two) times daily.     No current  facility-administered medications on file prior to visit.    Past Medical History:  Diagnosis Date   Acute sinusitis    Allergy    Anxiety    Depression    NO MEDICATIONS - DOING OK NOW   Dizziness    Ear ache    RIGHT --HX OF MULTIPLE EAR INFECTIONS AS A CHILD    GERD (gastroesophageal reflux disease)    Headache(784.0)    MIGRAINES   Leg pain, left    with swelling   Lumbar stenosis    PAIN BACK AND LEFT LEG AND NUMBNESS LEG AND FOOT   Peripheral vascular disease (HCC)    HX OF TREATMENT FOR VARICOSE VEINS RIGHT LEG   Thyroid disease    TOLD BLOOD LEVELS SLIGHTLY ABNORMAL - BUT NOT REQUIRED MEDICATION    Past Surgical History:   Procedure Laterality Date   ABDOMINAL HYSTERECTOMY     BACK SURGERY     bladder lift     AT SAME TIME OF HYSTERECTOMY   BRAVO PH STUDY N/A 07/26/2021   Procedure: BRAVO PH STUDY;  Surgeon: Jeani Hawking, MD;  Location: [facility];  Service: Endoscopy;  Laterality: N/A;   CHOLECYSTECTOMY     ESOPHAGOGASTRODUODENOSCOPY (EGD) WITH PROPOFOL N/A 07/26/2021   Procedure: ESOPHAGOGASTRODUODENOSCOPY (EGD) WITH PROPOFOL;  Surgeon: Jeani Hawking, MD;  Location: [facility];  Service: Endoscopy;  Laterality: N/A;   LUMBAR LAMINECTOMY/DECOMPRESSION MICRODISCECTOMY Left 10/24/2014   Procedure: COMPLETE LUMBAR DECOMPRESSION L4-L5 CENTRAL AND MICRODISCECTOMY L4-L5 LEFT;  Surgeon: Jacki Cones, MD;  Location: [facility];  Service: Orthopedics;  Laterality: Left;   POLYPECTOMY  07/26/2021   Procedure: POLYPECTOMY;  Surgeon: Jeani Hawking, MD;  Location: [facility];  Service: Endoscopy;;    Social History   Socioeconomic History   Marital status: Married    Spouse name: Not on file   Number of children: Not on file   Years of education: Not on file   Highest education level: Not on file  Occupational History   Not on file  Tobacco Use   Smoking status: Never   Smokeless tobacco: Never  Vaping Use   Vaping Use: Never used  Substance and Sexual Activity   Alcohol use: No   Drug use: No   Sexual activity: Not on file  Other Topics Concern   Not on file  Social History Narrative   ** Merged History Encounter **       Social Determinants of Health   Financial Resource Strain: Not on file  Food Insecurity: Not on file  Transportation Needs: Not on file  Physical Activity: Not on file  Stress: Not on file  Social Connections: Not on file    Family History  Problem Relation Age of Onset   Hypertension Mother    Arthritis Mother    Diabetes Mother    Hyperlipidemia Mother    Arthritis Sister    Rheum arthritis Brother    Autoimmune disease Brother    Lung disease Brother     Arthritis Sister    Gout Son    High Cholesterol Daughter    Thyroid disease Neg Hx     Review of Systems  Constitutional:  Positive for fatigue. Negative for fever.  Eyes:  Positive for visual disturbance.  Gastrointestinal:  Positive for nausea.  Neurological:  Positive for dizziness, weakness (leg weakness with migraines) and headaches. Negative for numbness.      Objective:   Vitals:   09/03/21 1013  BP: 120/80  Pulse: 60  Temp:  98.3 F (36.8 C)  SpO2: 99%   BP Readings from Last 3 Encounters:  09/03/21 120/80  08/28/21 124/82  08/21/21 105/62   Wt Readings from Last 3 Encounters:  09/03/21 188 lb (85.3 kg)  08/28/21 190 lb 3.2 oz (86.3 kg)  08/21/21 189 lb (85.7 kg)   Body mass index is 33.3 kg/m.   Physical Exam Constitutional:      General: She is not in acute distress.    Appearance: Normal appearance. She is not ill-appearing.  HENT:     Head: Normocephalic and atraumatic.  Eyes:     Extraocular Movements: Extraocular movements intact.  Musculoskeletal:     Right lower leg: No edema.     Left lower leg: No edema.  Skin:    General: Skin is warm and dry.  Neurological:     General: No focal deficit present.     Mental Status: She is alert.     Cranial Nerves: No cranial nerve deficit.     Sensory: No sensory deficit.     Motor: No weakness.     Gait: Gait normal.  Psychiatric:        Mood and Affect: Mood normal.            Assessment & Plan:    Migraine headaches: Chronic She does not relate she is ever been diagnosed, but it looks like Dr. Mitchel Honour did try her on Imitrex in the past, which helped, but did not resolve the headache and did give her some stomach upset Her symptoms are typical of migraines Advised her to monitor for any triggers so that she can potentially avoid some of the migraines Discussed treatment options Will get an MRI of her brain We will try Zomig 5 mg daily as needed, repeat times 2:01 hours if  needed Zofran 4 mg as needed for nausea associate with migraine Advised her to let us know how this works.  If she continues to have headaches we can try a different medication or consider preventative medication

## 2021-09-03 NOTE — Patient Instructions (Addendum)
    Flu immunization administered today.     Medications changes include :   zofran for nausea.  Zomig for migraines.  Valium for MRI   Your prescription(s) have been submitted to your pharmacy. Please take as directed and contact our office if you believe you are having problem(s) with the medication(s).   A MRI of your brain was ordered.   Someone from their office will call you to schedule an appointment.

## 2021-09-16 ENCOUNTER — Other Ambulatory Visit: Payer: Self-pay

## 2021-09-16 ENCOUNTER — Ambulatory Visit: Payer: 59 | Attending: Specialist | Admitting: Physical Therapy

## 2021-09-16 ENCOUNTER — Encounter: Payer: Self-pay | Admitting: Physical Therapy

## 2021-09-16 DIAGNOSIS — M545 Low back pain, unspecified: Secondary | ICD-10-CM | POA: Insufficient documentation

## 2021-09-16 DIAGNOSIS — M6281 Muscle weakness (generalized): Secondary | ICD-10-CM | POA: Insufficient documentation

## 2021-09-16 DIAGNOSIS — M25511 Pain in right shoulder: Secondary | ICD-10-CM | POA: Insufficient documentation

## 2021-09-16 DIAGNOSIS — R293 Abnormal posture: Secondary | ICD-10-CM | POA: Insufficient documentation

## 2021-09-16 DIAGNOSIS — G8929 Other chronic pain: Secondary | ICD-10-CM | POA: Diagnosis present

## 2021-09-16 DIAGNOSIS — M4316 Spondylolisthesis, lumbar region: Secondary | ICD-10-CM | POA: Diagnosis not present

## 2021-09-16 DIAGNOSIS — M542 Cervicalgia: Secondary | ICD-10-CM | POA: Diagnosis not present

## 2021-09-16 DIAGNOSIS — M25512 Pain in left shoulder: Secondary | ICD-10-CM | POA: Diagnosis present

## 2021-09-16 NOTE — Therapy (Signed)
[facility] [address] [place], [place], [address] Phone: [phone]   Fax:  [phone]  Physical Therapy Treatment / Re-certification  Patient Details  Name: Kendra Rodriguez MRN: [id number] Date of Birth: 1965-11-30 Referring Provider (PT): [name]   Encounter Date: 09/16/2021   PT End of Session - 09/16/21 1506     Visit Number 7    Number of Visits 17    Date for PT Re-Evaluation 11/11/21    Authorization Type [employer],    PT Start Time 1503    PT Stop Time 1548    PT Time Calculation (min) 45 min    Activity Tolerance Patient tolerated treatment well    Behavior During Therapy WFL for tasks assessed/performed             Past Medical History:  Diagnosis Date   Acute sinusitis    Allergy    Anxiety    Depression    NO MEDICATIONS - DOING OK NOW   Dizziness    Ear ache    RIGHT --HX OF MULTIPLE EAR INFECTIONS AS A CHILD    GERD (gastroesophageal reflux disease)    Headache(784.0)    MIGRAINES   Leg pain, left    with swelling   Lumbar stenosis    PAIN BACK AND LEFT LEG AND NUMBNESS LEG AND FOOT   Peripheral vascular disease (HCC)    HX OF TREATMENT FOR VARICOSE VEINS RIGHT LEG   Thyroid disease    TOLD BLOOD LEVELS SLIGHTLY ABNORMAL - BUT NOT REQUIRED MEDICATION    Past Surgical History:  Procedure Laterality Date   ABDOMINAL HYSTERECTOMY     BACK SURGERY     bladder lift     AT SAME TIME OF HYSTERECTOMY   BRAVO PH STUDY N/A 07/26/2021   Procedure: BRAVO PH STUDY;  Surgeon: Jeani Hawking, MD;  Location: [facility];  Service: Endoscopy;  Laterality: N/A;   CHOLECYSTECTOMY     ESOPHAGOGASTRODUODENOSCOPY (EGD) WITH PROPOFOL N/A 07/26/2021   Procedure: ESOPHAGOGASTRODUODENOSCOPY (EGD) WITH PROPOFOL;  Surgeon: Jeani Hawking, MD;  Location: [facility];  Service: Endoscopy;  Laterality: N/A;   LUMBAR LAMINECTOMY/DECOMPRESSION MICRODISCECTOMY Left 10/24/2014   Procedure: COMPLETE LUMBAR  DECOMPRESSION L4-L5 CENTRAL AND MICRODISCECTOMY L4-L5 LEFT;  Surgeon: Jacki Cones, MD;  Location: [facility];  Service: Orthopedics;  Laterality: Left;   POLYPECTOMY  07/26/2021   Procedure: POLYPECTOMY;  Surgeon: Jeani Hawking, MD;  Location: [facility];  Service: Endoscopy;;    There were no vitals filed for this visit.   Subjective Assessment - 09/16/21 1506     Subjective "The neck is still giving m eissues but the back seems to be worsening with increased referred pain down the Rleg that seems worsening lately and potentially impacted by weather.    How long can you sit comfortably? 30 min    How long can you stand comfortably? 30 - 40 min    How long can you walk comfortably? 30-40 min    Diagnostic tests xray at [facility] for neck and back.    Patient Stated Goals to stop the pain,    Currently in Pain? Yes    Pain Score 6     Pain Location Back    Pain Orientation Right    Pain Descriptors / Indicators Aching;Sore;Shooting;Tingling;Tightness    Pain Type Chronic pain    Pain Onset More than a month ago    Pain Frequency Intermittent    Aggravating Factors  walking, weather,  bending down    Pain Relieving Factors exercises    Pain Score 3    Pain Location Neck    Pain Descriptors / Indicators Aching;Sore    Pain Type Chronic pain    Aggravating Factors  doing too much at home    Pain Relieving Factors heat, exercise                [facility] PT Assessment - 09/16/21 0001       Assessment   Medical Diagnosis Spondylolisthesis, lumbar region (M43.16), Tendonitis of both shoulders (M77.8)    Referring Provider (PT) [name]      Posture/Postural Control   Posture/Postural Control Postural limitations      AROM   Lumbar Flexion 98    Lumbar Extension 10    Lumbar - Right Side Bend 20   reproduced R LE referred pain   Lumbar - Left Side Bend 20      Strength   Right Hip Flexion 4-/5   reproduced low back pain   Right Hip ABduction 4-/5    Left Hip Flexion 4/5     Left Hip ABduction 4-/5    Right Knee Flexion 5/5    Right Knee Extension 5/5    Left Knee Flexion 5/5    Left Knee Extension 5/5                           [facility] Adult PT Treatment/Exercise:  Therapeutic Exercise: LTR 2 x 10 Sciatic nerve glides with hip flexion to 90 and knee extended with ankle pumps 2 x 20 Posterior pelvic tilt 2 x 10 (tactile cues for proper form) SKTC stretch 2 x 30 sec  Manual Therapy:  Manual traction bil LE x 3 min   Neuromuscular re-ed: N/A  Therapeutic Activity: N/A  Modalities: N/A  Self Care: N/A  Consider / progression for next session:          PT Education - 09/16/21 1552     Education Details Reviewed her HEP and discussed progression. Discussed limitations regrarding insurance visit and that we are happy to see her but her insurance will only cover 2 more visits for the year, she she wants to be seen beyond that she may incur a bil. dicsussed benefits of aquatic therapy and POC.    Person(s) Educated Patient    Methods Explanation;Verbal cues;Handout    Comprehension Verbalized understanding;Verbal cues required              PT Short Term Goals - 08/07/21 1559       PT SHORT TERM GOAL #1   Title pt to be I with inital HEP    Status Achieved      PT SHORT TERM GOAL #2   Title pt to verbalize/ demo efficient posture and lifting mechanics to reduce and prevent shoulder / back pain    Status Achieved      PT SHORT TERM GOAL #3   Title increase bil hip abductor strength by at leaset 1 muscle grade.    Status Achieved               PT Long Term Goals - 09/16/21 1510       PT LONG TERM GOAL #1   Title pt to report reduced cervical pain to </= 2/10 max for improvement of condition    Period Weeks    Status On-going    Target Date 11/11/21  PT LONG TERM GOAL #2   Title pt to maintain trunk mobility in all planes with </= 2/10 max pain for improving QOL    Time 6    Period Weeks     Status On-going    Target Date 11/11/21      PT LONG TERM GOAL #3   Title pt to report no LLE referred symptoms for >/= 2 weeks for improvement in condition    Time 8    Period Weeks    Status On-going    Target Date 11/11/21      PT LONG TERM GOAL #4   Title increase FOTO to >/=55% to demo improvement in function    Time 8    Period Weeks    Status New    Target Date 11/11/21      PT LONG TERM GOAL #5   Title pt to be IND with all HEP given and is able to maintain and progress current LOF    Period Weeks    Status On-going    Target Date 11/11/21                   Plan - 09/16/21 1547     Clinical Impression Statement Kendra Rodriguez returns back since her last visit over 1 month ago. She was recently seen by her MD noting increase in pain in the back and RLE that is worst with activity. She has increased RLE referred symptoms that does respond well to flexion biased treatment and manual traction. Trialed mechanical traction which was set at 70# x 4 min before pt request to stop due to soreness in her back but did note decreased RLE pain dropping to 3-4/10. reviewed Her HEP and discussed benefit for getting set up with aquatic therapy. She would benefit from physical therapy to decrease low back pain, reduce RLE referred symtpoms and maximize her function by addressing the deficits listed.    Comorbidities hx of depression, anxiety    PT Frequency 1x / week    PT Duration 8 weeks    PT Treatment/Interventions ADLs/Self Care Home Management;Iontophoresis 4mg /ml Dexamethasone;Moist Heat;Traction;Electrical Stimulation;Therapeutic exercise;Therapeutic activities;Balance training;Gait training;Cryotherapy;Neuromuscular re-education;Patient/family education;Manual techniques;Passive range of motion;Dry needling;Taping    PT Next Visit Plan review/ update HEP, flexion biased, update aquatic HEP (pt has access to pool) core strengthening, lifting mechanics keeping core tight    PT Home  Exercise Plan [facility] - chin tuck, scapular retraction, prone on elbows, ltr, SKTC, supine marching, ab set    Consulted and Agree with Plan of Care Patient             Patient will benefit from skilled therapeutic intervention in order to improve the following deficits and impairments:  Improper body mechanics, Increased muscle spasms, Decreased strength, Pain, Postural dysfunction, Decreased activity tolerance, Decreased endurance  Visit Diagnosis: Cervicalgia  Abnormal posture  Chronic bilateral low back pain, unspecified whether sciatica present     Problem List Patient Active Problem List   Diagnosis Date Noted   Post-COVID syndrome 06/04/2021   Hyperthyroidism 03/29/2021   Body mass index (BMI) of 35.0-35.9 in adult 06/04/2020   History of migraine headaches 12/06/2019   Dyslipidemia 06/30/2019   GERD with esophagitis 06/30/2019   Prediabetes 10/17/2015   Spinal stenosis, lumbar region, with neurogenic claudication 10/24/2014   10/26/2014 PT, DPT, LAT, ATC  09/16/21  3:58 PM     [facility] [address] [place], [place], [place] Phone: [phone]  Fax:  [phone]  Name: Kendra Rodriguez MRN: [id number] Date of Birth: [birth year]

## 2021-09-16 NOTE — Patient Instructions (Signed)
Aquatic Therapy at [facility]-  What to Expect!  Where:   [facility] [address] [facility] [address] [place], [place] [address] Rehab phone [phone]  NOTE:  You will receive an automated phone message reminding you of your appt and it will say the appointment is at the [address] [facility] [address] [facility] clinic.          How to Prepare: Please make sure you drink 8 ounces of water about one hour prior to your pool session A caregiver may attend if needed with the patient to help assist as needed. A caregiver can sit in the pool room on chair. Please arrive IN YOUR SUIT and 15 minutes prior to your appointment - this helps to avoid delays in starting your session. Please make sure to attend to any toileting needs prior to entering the pool Locker rooms for changing are provided.   There is direct access to the pool deck form the locker room.  You can lock your belongings in a locker with lock provided. Once on the pool deck your therapist will ask if you have signed the Patient  Consent and Assignment of Benefits form before beginning treatment Your therapist may take your blood pressure prior to, during and after your session if indicated We usually try and create a home exercise program based on activities we do in the pool.  Please be thinking about who might be able to assist you in the pool should you need to participate in an aquatic home exercise program at the time of discharge if you need assistance.  Some patients do not want to or do not have the ability to participate in an aquatic home program - this is not a barrier in any way to you participating in aquatic therapy as part of your current therapy plan! After Discharge from PT, you can continue using home program at  the [facility]/, there is a drop-in fee for $5 ($45 a month)or for 60 years  or older $4.00 ($40 a month for seniors ) or any local [facility] pool.  Memberships for purchase are  available for gym/pool at [facility]  IT IS VERY IMPORTANT THAT YOUR LAST VISIT BE IN THE CLINIC AT [address] AFTER YOUR LAST AQUATIC VISIT.  PLEASE MAKE SURE THAT YOU HAVE A [address]  APPOINTMENT SCHEDULED.   About the pool: Pool is located approximately 500 FT from the entrance of the building.  Please bring a support person if you need assistance traveling this      distance.   Your therapist will assist you in entering the water; there are two ways to           enter: stairs with railings, and a mechanical lift. Your therapist will determine the most appropriate way for you.  Water temperature is usually between 88-90 degrees  There may be up to 2 other swimmers in the pool at the same time  The pool deck is tile, please wear shoes with good traction if you prefer not to be barefoot.    Contact Info:  For appointment scheduling and cancellations:         Please call the [facility]  PH:[phone]              Aquatic Therapy  [facility]patient [facility]       All sessions are 45 minutes                                                    

## 2021-09-25 ENCOUNTER — Ambulatory Visit: Payer: 59 | Admitting: Physical Therapy

## 2021-09-30 ENCOUNTER — Ambulatory Visit
Admission: RE | Admit: 2021-09-30 | Discharge: 2021-09-30 | Disposition: A | Discharge status: Home / Self Care | Payer: 59 | Source: Ambulatory Visit | Attending: Internal Medicine | Admitting: Internal Medicine

## 2021-09-30 DIAGNOSIS — G43819 Other migraine, intractable, without status migrainosus: Secondary | ICD-10-CM

## 2021-09-30 DIAGNOSIS — G43109 Migraine with aura, not intractable, without status migrainosus: Secondary | ICD-10-CM

## 2021-10-02 ENCOUNTER — Ambulatory Visit: Payer: 59 | Admitting: Physical Therapy

## 2021-10-02 ENCOUNTER — Other Ambulatory Visit: Payer: Self-pay

## 2021-10-02 ENCOUNTER — Encounter: Payer: Self-pay | Admitting: Physical Therapy

## 2021-10-02 DIAGNOSIS — G8929 Other chronic pain: Secondary | ICD-10-CM

## 2021-10-02 DIAGNOSIS — M6281 Muscle weakness (generalized): Secondary | ICD-10-CM

## 2021-10-02 DIAGNOSIS — R293 Abnormal posture: Secondary | ICD-10-CM

## 2021-10-02 DIAGNOSIS — M542 Cervicalgia: Secondary | ICD-10-CM

## 2021-10-02 DIAGNOSIS — M545 Low back pain, unspecified: Secondary | ICD-10-CM

## 2021-10-02 NOTE — Therapy (Addendum)
[place], [place], [address] Phone: [phone]   Fax:  [phone]  Physical Therapy Treatment / Discharge  Patient Details  Name: Kendra Rodriguez MRN: [id number] Date of Birth: 1966/03/22 Referring Provider (PT): Basil Dess MD   Encounter Date: 10/02/2021   PT End of Session - 10/02/21 1426     Visit Number 8    Number of Visits 17    Date for PT Re-Evaluation 11/11/21    Authorization Type [employer],    PT Start Time 1330    PT Stop Time 7096    PT Time Calculation (min) 50 min    Activity Tolerance Patient tolerated treatment well    Behavior During Therapy WFL for tasks assessed/performed             Past Medical History:  Diagnosis Date   Acute sinusitis    Allergy    Anxiety    Depression    NO MEDICATIONS - DOING OK NOW   Dizziness    Ear ache    RIGHT --HX OF MULTIPLE EAR INFECTIONS AS A CHILD    GERD (gastroesophageal reflux disease)    Headache(784.0)    MIGRAINES   Leg pain, left    with swelling   Lumbar stenosis    PAIN BACK AND LEFT LEG AND NUMBNESS LEG AND FOOT   Peripheral vascular disease ([place])    HX OF TREATMENT FOR VARICOSE VEINS RIGHT LEG   Thyroid disease    TOLD BLOOD LEVELS SLIGHTLY ABNORMAL - BUT NOT REQUIRED MEDICATION    Past Surgical History:  Procedure Laterality Date   ABDOMINAL HYSTERECTOMY     BACK SURGERY     bladder lift     AT SAME TIME OF HYSTERECTOMY   BRAVO [place] STUDY N/A 07/26/2021   Procedure: BRAVO [place] STUDY;  Surgeon: Carol Ada, MD;  Location: [facility];  Service: Endoscopy;  Laterality: N/A;   CHOLECYSTECTOMY     ESOPHAGOGASTRODUODENOSCOPY (EGD) WITH PROPOFOL N/A 07/26/2021   Procedure: ESOPHAGOGASTRODUODENOSCOPY (EGD) WITH PROPOFOL;  Surgeon: Carol Ada, MD;  Location: [facility];  Service: Endoscopy;  Laterality: N/A;   LUMBAR LAMINECTOMY/DECOMPRESSION MICRODISCECTOMY Left 10/24/2014   Procedure: COMPLETE LUMBAR  DECOMPRESSION L4-L5 CENTRAL AND MICRODISCECTOMY L4-L5 LEFT;  Surgeon: Tobi Bastos, MD;  Location: [facility];  Service: Orthopedics;  Laterality: Left;   POLYPECTOMY  07/26/2021   Procedure: POLYPECTOMY;  Surgeon: Carol Ada, MD;  Location: [facility];  Service: Endoscopy;;    There were no vitals filed for this visit.   Subjective Assessment - 10/02/21 1617     Subjective Pt reports 6/10 pain in low back    Patient is accompained by: Family member   dtr V   Diagnostic tests xray at [facility] for neck and back.    Patient Stated Goals to stop the pain,    Currently in Pain? Yes    Pain Score 6     Pain Location Back    Pain Orientation Right    Pain Descriptors / Indicators Aching;Sore    Pain Type Chronic pain    Pain Onset More than a month ago    Pain Frequency Intermittent           Aquatic therapy at [address] - therapeutic pool temp 94 degrees Pt enters building without AD.  Treatment took place in water 3.8 to  4 ft 8 in.feet deep depending upon activity.  Pt entered and exited the pool via stair  and handrails independently.  Ms Laham dtr present throughout RX   Ms Kaus entered water for aquatic therapy for first time and was introduced to principles and therapeutic effects of water as she ambulated and acclimated to pool. Marland Kitchen    She was able to use aquatic barbells while ambulating to increase abdominal engagement with exercises. She was guided by PT with VC  for correct execution forward and backward and side stepping utilizing barbells effectively 15 min   Pt /dtr educated on neutral posture and hip hinging in seated position with water at chest level x 10 with stretch to low back and then x 10 with back at pool wall at external cue, VC for neck tucked to prevent hyperextension. Pt / dtr educated on abdominal engagement with exercise   Runners stretch  2 x  30 sec on Left and then moves into hamstring stretch  Then runners stretch on R  2 x 30 sec  hold and then move into hamstring stretch . TC to insure proper technique. Figure 4 squat stretch with UE support for R and L x 60 sec each        Went over HEP laminated in order for pt/dtr for clarification VC and TC for correct execution and sequencing March at [employer] at [employer] Hip Abduction Adduction at [employer]  Standing Hip Flexion Extension at [place] to Target at [place] at [place] in [place] with Pool Noodle - Cat Cow in Shallow Water with Pool Noodle -  Figure 8 Arms with Partial Squat  Abdominal Curls Diagonal with Upper Extremity Flotation  Heel Toe Raises at [place], Pt with lumbar belt around hips and nek doodle for neck support.  .  Pt assisted into supine floating position by lying head on shoulder of PT to get into floating position. . PT at torso and assisting with trunk left to right and vice versa to engage trunk muscles. PT then rotated trunk in order to engage abdomnal (internal and external obliques)  Emphasis on breathing techniques to draw in abdominals for support.  Pt then utilizing posterior chain and engaging Hip extension and knee flexion with water resistance while PT used Aquastretch techniques to decrease muscle tension in low back        Pt requires the buoyancy of water for active assisted exercises with buoyancy supported for strengthening and AROM exercises: PT  requires the viscosity of the water for resistance with strengthening exercises Hydrostatic pressure also supports joints by unweighting joint load by at least 50 % in 3-4 feet depth water. 80% in chest to neck deep water. Water will allow for  reduced joint loading through buoyancy to help patient improve posture without excess stress and pain.                             PT Education - 10/02/21 1425     Education Details  Educated on Aquatic principles and therapeutic effects of water.  given laminated HEP    Person(s) Educated Patient    Methods Explanation;Demonstration;Tactile cues;Verbal cues;Handout    Comprehension Verbalized understanding;Returned demonstration              PT Short Term Goals - 08/07/21 1559  PT SHORT TERM GOAL #1   Title pt to be I with inital HEP    Status Achieved      PT SHORT TERM GOAL #2   Title pt to verbalize/ demo efficient posture and lifting mechanics to reduce and prevent shoulder / back pain    Status Achieved      PT SHORT TERM GOAL #3   Title increase bil hip abductor strength by at leaset 1 muscle grade.    Status Achieved               PT Long Term Goals - 09/16/21 1510       PT LONG TERM GOAL #1   Title pt to report reduced cervical pain to </= 2/10 max for improvement of condition    Period Weeks    Status On-going    Target Date 11/11/21      PT LONG TERM GOAL #2   Title pt to maintain trunk mobility in all planes with </= 2/10 max pain for improving QOL    Time 6    Period Weeks    Status On-going    Target Date 11/11/21      PT LONG TERM GOAL #3   Title pt to report no LLE referred symptoms for >/= 2 weeks for improvement in condition    Time 8    Period Weeks    Status On-going    Target Date 11/11/21      PT LONG TERM GOAL #4   Title increase FOTO to >/=55% to demo improvement in function    Time 8    Period Weeks    Status New    Target Date 11/11/21      PT LONG TERM GOAL #5   Title pt to be IND with all HEP given and is able to maintain and progress current LOF    Period Weeks    Status On-going    Target Date 11/11/21                   Plan - 10/02/21 1426     Clinical Impression Statement Ms Campus enters pool area with dtr V and changes into pool attire independently.  Pt does not require AD.  Pt reports 6/10 at begining of session but a 3/10 at end of aquatic session.  Pt educated on  Therapeutic principles and benefits of aquatics.  Pt given laminated copy of HEP to take to local pool.  Pt may need reinforcement of exercies as well as instruction in better abdominal engagement in supine and prone water positions.  Pt requires the buoyancy of water for active assisted exercises with buoyancy supported for strengthening and AROM exercises: PT  requires the viscosity of the water for resistance with strengthening exercises  Hydrostatic pressure also supports joints by unweighting joint load by at least 50 % in 3-4 feet depth water. 80% in chest to neck deep water.    Personal Factors and Comorbidities Age;Comorbidity 2;Past/Current Experience    Comorbidities hx of depression, anxiety    Examination-Activity Limitations Sit;Stairs;Squat    PT Frequency 1x / week    PT Duration 8 weeks    PT Treatment/Interventions ADLs/Self Care Home Management;Iontophoresis 68m/ml Dexamethasone;Moist Heat;Traction;Electrical Stimulation;Therapeutic exercise;Therapeutic activities;Balance training;Gait training;Cryotherapy;Neuromuscular re-education;Patient/family education;Manual techniques;Passive range of motion;Dry needling;Taping    PT Next Visit Plan review/ update HEP, flexion biased, update aquatic HEP (pt has access to pool) core strengthening, lifting mechanics keeping core tight    PT Home  Exercise Plan [id number] - chin tuck, scapular retraction, prone on elbows, ltr, SKTC, supine marching, ab set  [id number]    Consulted and Agree with Plan of Care Patient             Patient will benefit from skilled therapeutic intervention in order to improve the following deficits and impairments:  Improper body mechanics, Increased muscle spasms, Decreased strength, Pain, Postural dysfunction, Decreased activity tolerance, Decreased endurance  Visit Diagnosis: Cervicalgia  Abnormal posture  Chronic bilateral low back pain, unspecified whether sciatica present  Chronic right shoulder  pain  Chronic left shoulder pain  Muscle weakness (generalized)  Chronic right-sided low back pain without sciatica     Problem List Patient Active Problem List   Diagnosis Date Noted   Post-COVID syndrome 06/04/2021   Hyperthyroidism 03/29/2021   Body mass index (BMI) of 35.0-35.9 in adult 06/04/2020   History of migraine headaches 12/06/2019   Dyslipidemia 06/30/2019   GERD with esophagitis 06/30/2019   Prediabetes 10/17/2015   Spinal stenosis, lumbar region, with neurogenic claudication 10/24/2014    Voncille Lo, PT, [facility] Certified Exercise Expert for the Aging Adult  10/02/21 4:21 PM Phone: [phone] Fax: [facility] [place], [place], [address] Phone: [phone]   Fax:  [phone]  Name: Kendra Rodriguez MRN: [id number] Date of Birth: 1966-09-30      PHYSICAL THERAPY DISCHARGE SUMMARY  Visits from Start of Care: 8  Current functional level related to goals / functional outcomes: See goals   Remaining deficits: Current status unknown due to not returning   Education / Equipment: HEP theraband, posture, lifting mechanics.    Patient agrees to discharge. Patient goals were not met. Patient is being discharged due to not returning since the last visit.   Kristoffer Leamon PT, DPT, LAT, ATC  01/01/22  2:53 PM

## 2021-10-02 NOTE — Patient Instructions (Addendum)
  Access Code: [email or web address] URL: [email or web address] Date: 09/24/2021 Prepared by: Garen Lah  Exercises Standing March at [facility] - 1 x daily - 7 x weekly - 3 sets - 10 reps Lateral Stepping at [employer] - 1 x daily - 7 x weekly - 3 sets - 10 reps Standing Hip Abduction Adduction at Pool Wall - 1 x daily - 7 x weekly - 3 sets - 10 reps Standing Hip Flexion Extension at [employer] - 1 x daily - 7 x weekly - 3 sets - 10 reps Squat - 1 x daily - 7 x weekly - 3 sets - 10 reps Lunge to Target at [employer] - 1 x daily - 7 x weekly - 3 sets - 10 reps Side Stepping - 1 x daily - 7 x weekly - 3 sets - 10 reps Standing Hip Circles at [employer] - 1 x daily - 7 x weekly - 3 sets - 10 reps Full Triangle Pose in Shallow Water with Pool Noodle - 1 x daily - 7 x weekly - 3 sets - 10 reps Cat Cow in Shallow Water with Pool Noodle - 1 x daily - 7 x weekly - 3 sets - 10 reps Figure 8 Arms with Partial Squat - 1 x daily - 7 x weekly - 3 sets - 10 reps Abdominal Curls Diagonal with Upper Extremity Flotation - 1 x daily - 7 x weekly - 3 sets - 10 reps Heel Toe Raises at Pool Wall - 1 x daily - 7 x weekly - 3 sets - 10 reps  Garen Lah, PT, [facility] Certified Exercise Expert for the Aging Adult  10/02/21 2:29 PM Phone: [phone] Fax: [phone]

## 2021-10-23 ENCOUNTER — Ambulatory Visit: Payer: 59 | Admitting: Specialist

## 2021-11-27 ENCOUNTER — Encounter: Payer: Self-pay | Admitting: Specialist

## 2021-11-27 ENCOUNTER — Ambulatory Visit (INDEPENDENT_AMBULATORY_CARE_PROVIDER_SITE_OTHER): Payer: Managed Care, Other (non HMO) | Admitting: Specialist

## 2021-11-27 ENCOUNTER — Other Ambulatory Visit: Payer: Self-pay

## 2021-11-27 ENCOUNTER — Ambulatory Visit: Payer: Self-pay

## 2021-11-27 VITALS — BP 128/75 | HR 66 | Ht 63.0 in | Wt 188.0 lb

## 2021-11-27 DIAGNOSIS — M7501 Adhesive capsulitis of right shoulder: Secondary | ICD-10-CM

## 2021-11-27 DIAGNOSIS — G5603 Carpal tunnel syndrome, bilateral upper limbs: Secondary | ICD-10-CM

## 2021-11-27 DIAGNOSIS — M47812 Spondylosis without myelopathy or radiculopathy, cervical region: Secondary | ICD-10-CM

## 2021-11-27 DIAGNOSIS — M7671 Peroneal tendinitis, right leg: Secondary | ICD-10-CM

## 2021-11-27 DIAGNOSIS — M4316 Spondylolisthesis, lumbar region: Secondary | ICD-10-CM | POA: Diagnosis not present

## 2021-11-27 DIAGNOSIS — M778 Other enthesopathies, not elsewhere classified: Secondary | ICD-10-CM

## 2021-11-27 DIAGNOSIS — M542 Cervicalgia: Secondary | ICD-10-CM | POA: Diagnosis not present

## 2021-11-27 NOTE — Patient Instructions (Addendum)
Avoid overhead lifting and overhead use of the arms. Pillows to keep from sleeping directly on the shoulders Limited lifting to less than 10 lbs. Ice or heat for relief. NSAIDs are helpful, such as alleve or motrin, be careful not to use in excess as they place burdens on the kidney. Stretching exercise help and strengthening is helpful to build endurance.  Ejercicios para los hombros Shoulder Exercises Pregunte al mdico qu ejercicios son seguros para usted. Haga los ejercicios exactamente como se lo haya indicado el mdico y gradelos como se lo hayan indicado. Es normal sentir un estiramiento leve, tironeo, opresin o Tree surgeon al [employer] [place]. Detngase de inmediato si siente un dolor repentino o Printmaker. No comience a hacer estos ejercicios hasta que se lo indique el mdico. Ejercicios de estiramiento Rotacin externa y abduccin Este ejercicio a veces se denomina estiramiento en una esquina. Este ejercicio rota el brazo Emergency planning/management officer (rotacin externa) y aleja el brazo del cuerpo (abduccin). Prese en una entrada de una puerta con un pie adelante del otro, a una corta distancia uno del otro. Esto se denomina escalonamiento. Si no llega a apoyar los [employer] de la [place], prese frente a una esquina de la habitacin. Elija una de estas posiciones, como se lo haya indicado el mdico: [place] y los antebrazos sobre el marco de la [place], por encima de la cabeza. [place] y los antebrazos sobre el marco de la puerta, a la altura de la cabeza. [place] y los antebrazos sobre el marco de la puerta, a la altura de los codos. Lentamente, lleve el peso al pie de adelante hasta sentir un estiramiento en el pecho y en la parte de adelante de los hombros. Mantenga la cabeza y el pecho erguidos, y los msculos abdominales tensionados. Mantenga esta posicin durante __________ segundos. Para aflojar el estiramiento, lleve el peso al pie de  atrs. Repita __________ veces. Realice este ejercicio __________ veces al da. Extensin, de pie Prese y sostenga un palo de escoba, un bastn o un objeto similar detrs de la espalda. Las manos deben estar separadas a una distancia un poco mayor que el ancho de los hombros. Las palmas deben estar en direccin contraria a la espalda. Manteniendo los codos extendidos y los msculos de los hombros relajados, aleje el palo del cuerpo hasta sentir un estiramiento en el hombro (extensin). No encoja los hombros al Teacher, English as a foreign language. Mantenga los omplatos juntos, llvelos hacia el centro de la espalda. Mantenga esta posicin durante __________ segundos. Vuelva lentamente a la posicin inicial. Repita __________ veces. Realice este ejercicio __________ veces al da. Ejercicios de amplitud de movimientos Pndulo  Prese cerca de una pared o de una superficie de la que pueda sostenerse para Theatre manager el equilibrio. Flexione la cintura y deje que el brazo izquierdo/derecho cuelgue extendido. Use el otro brazo para apoyarse. Mantenga la espalda derecha y no trabe las rodillas. Relaje los msculos del brazo y el hombro izquierdo/derecho, y Programmer, systems la cadera y el tronco de modo que el brazo izquierdo/derecho se balancee libremente. El brazo debe balancearse por el movimiento del cuerpo, no por la fuerza de los msculos del brazo o el hombro. Contine moviendo las caderas y el tronco de modo que el brazo se balancee en las siguientes direcciones, como se lo haya indicado el mdico: De lado a lado. Hacia adelante y Maupin. En crculos, en el sentido de las agujas del [place] y en sentido contrario. Contine  cada movimiento durante __________ segundos o durante el tiempo que le haya indicado el mdico. Vuelva lentamente a la posicin inicial. Repita __________ veces. Realice este ejercicio __________ veces al da. Flexin del hombro, de pie  Prese y sostenga un palo de escoba, un bastn o un objeto  similar. [place] sobre el objeto a una distancia un poco mayor que el ancho de los hombros. [place] izquierda/derecha debe estar con la palma Dwyane Luo arriba, y la otra mano debe estar con la palma hacia abajo. Mantenga el codo extendido y los msculos del hombro relajados. Eleve el palo con el brazo sano para levantar el brazo izquierdo/derecho frente al cuerpo y luego sobre la cabeza hasta sentir un estiramiento en el hombro (flexin). Evite encoger el hombro mientras levanta el brazo. Mantenga los omplatos juntos, llvelos hacia el centro de la espalda. Mantenga esta posicin durante __________ segundos. Vuelva lentamente a la posicin inicial. Repita __________ veces. Realice este ejercicio __________ veces al da. Abduccin del hombro, de pie Prese y sostenga un palo de escoba, un bastn o un objeto similar. [place] sobre el objeto a una distancia un poco mayor que el ancho de los hombros. [place] izquierda/derecha debe estar con la palma Dwyane Luo arriba, y la otra mano debe estar con la palma hacia abajo. Mantenga el codo extendido y los msculos del hombro relajados. Empuje el objeto cruzando el cuerpo hacia el lado izquierdo/derecho. Levante el brazo izquierdo/derecho al costado del cuerpo (abduccin) y luego sobre la cabeza hasta sentir un estiramiento en el hombro. No levante el brazo por encima de la altura del hombro, a menos que el mdico se lo indique. Si se lo indican, eleve el brazo [employer]. Evite encoger el hombro mientras levanta el brazo. Mantenga los omplatos juntos, llvelos hacia el centro de la espalda. Mantenga esta posicin durante __________ segundos. Vuelva lentamente a la posicin inicial. Repita __________ veces. Realice este ejercicio __________ veces al da. Rotacin interna  Coloque la mano izquierda/derecha detrs de la espalda, con la palma [place] arriba. Con la Kerr-McGee, cuelgue una banda para ejercicios, una toalla o  un objeto similar sobre el hombro. Agarre la banda con la mano izquierda/derecha, de modo de tener agarrados ambos extremos. Suavemente, tire de la banda hacia arriba hasta sentir un estiramiento en la parte de adelante del hombro izquierdo/derecho. El movimiento del brazo hacia el centro del cuerpo se llama rotacin interna. Evite encoger el hombro mientras levanta el brazo. Mantenga los omplatos juntos, llvelos hacia el centro de la espalda. Mantenga esta posicin durante __________ segundos. Afloje el estiramiento, suelte la banda y [employer]on [employer]. Repita __________ veces. Realice este ejercicio __________ veces al da. Ejercicios de fortalecimiento Rotacin externa  Sintese en una silla estable que no tenga apoyabrazos. Ate una banda para ejercicios a un objeto estable a la altura del codo del lado izquierdo/derecho. Coloque un objeto blando, como una toalla doblada o una almohada pequea entre la parte superior del brazo izquierdo/derecho y el cuerpo, de forma que el codo est separado del costado del cuerpo a una distancia de unas 4 pulgadas (10 cm). Sostenga el extremo de la banda para ejercicios de modo que quede tensa y no se afloje. Con el codo presionado sobre el objeto blando, aleje lentamente el antebrazo del abdomen (rotacin externa). No mueva el cuerpo; solo Financial risk analyst. Mantenga esta posicin durante __________ segundos. Vuelva lentamente a la posicin inicial. Repita __________ veces. Realice este ejercicio __________  veces al da. Abduccin del hombro  Sintese en una silla estable que no tenga apoyabrazos o pngase de pie. Sostenga una pesa de __________ con la mano izquierda/derecha, o una banda para ejercicios con ambas manos. Comience con los brazos extendidos hacia abajo y con la palma izquierda/derecha hacia adentro, en direccin a su cuerpo. Lentamente, levante la mano izquierda/derecha hacia el costado (abduccin). No levante la mano por encima de la  altura del hombro, a menos que el mdico le diga que no hay riesgos. Mantenga los brazos extendidos. No encoja los hombros al Exelon Corporation. Mantenga los omplatos juntos, llvelos hacia el centro de la espalda. Mantenga esta posicin durante __________ segundos. Baje lentamente el brazo y vuelva a la posicin inicial. Repita __________ veces. Realice este ejercicio __________ veces al da. Extensin del hombro Sintese en una silla estable que no tenga apoyabrazos o pngase de pie. Ate una banda para ejercicios a un objeto estable que est frente a usted, de modo que la banda est a la altura del hombro. Sostenga un extremo de la banda para ejercicios en cada mano. Las palmas deben estar enfrentadas. Extienda los codos y [employer]X Corporation a la altura de los hombros. [place] hacia atrs, alejndose del extremo fijo de la banda para ejercicios, hasta que [place] se tense y no quede floja. Junte los omplatos a medida que baja las manos hacia los costados de los muslos (extensin). Detngase cuando las manos estn en la misma posicin en ambos costados. No deje que las manos vayan hacia atrs del cuerpo. Mantenga esta posicin durante __________ segundos. Vuelva lentamente a la posicin inicial. Repita __________ veces. Realice este ejercicio __________ veces al da. Remo con los hombros Sintese en una silla estable que no tenga apoyabrazos o pngase de pie. Ate una banda para ejercicios a un objeto estable que est frente a usted, de modo que la banda est a la altura de la cintura. Sostenga un extremo de la banda para ejercicios en cada mano. Coloque las palmas de modo que los pulgares queden hacia el techo (posicin [place]). Flexione ambos codos en ngulo de 90 grados (ngulo recto) y Hershey Company parte superior de los brazos a los costados. [place] hacia atrs hasta que la banda quede tensa y no se afloje. Lentamente, lleve los codos hacia atrs de su cuerpo. Mantenga esta posicin  durante __________ segundos. Vuelva lentamente a la posicin inicial. Repita __________ veces. Realice este ejercicio __________ veces al da. Flexiones de hombros  Sintese en una silla estable que tenga apoyabrazos. Sintese erguido, con los pies apoyados en el suelo. Coloque las [employer] Corporation apoyabrazos, con los codos flexionados y los dedos apuntando hacia adelante. Las manos deben estar parejas a los lados de su cuerpo. Presione con las [employer]TEL Corporation apoyabrazos y use los brazos para levantarse de la silla. Extienda los codos y levntese todo lo que pueda, sin estar incmodo. Lleve los omplatos hacia abajo y no levante los hombros hacia las [place]. Mantenga los pies en el suelo. A medida que adquiere ms fuerza, los pies deben sostener menos peso del cuerpo [place] se [place]. Mantenga esta posicin durante __________ segundos. Lentamente, vuelva a sentarse en la silla. Repita __________ veces. Realice este ejercicio __________ veces al da. Flexiones de [employer] pared  Prese frente a una pared estable. Coloque los pies separados de la pared a una distancia aproximada de un brazo. Inclnese hacia adelante y coloque las palmas sobre la pared a la altura de los hombros.  Sin levantar los pies del suelo, flexione los codos e inclnese hacia la pared. Mantenga esta posicin durante __________ segundos. Extienda los codos para regresar a la posicin inicial. Repita __________ [employer]el[employer] Corporation. Realice este ejercicio __________ veces al da. Esta informacin no tiene Marine scientist el consejo del mdico. Asegrese de hacerle al mdico cualquier pregunta que tenga. Document Revised: 12/21/2018 Document Reviewed: 12/21/2018 Elsevier Patient Education  2022 [place]. Sndrome del tnel carpiano Carpal Tunnel Syndrome El sndrome del tnel carpiano es una afeccin que causa dolor, adormecimiento y debilidad en la mano y los dedos. El tnel carpiano es un rea estrecha ubicada en el  lado palmar de la [place]. Los movimientos repetidos de la mueca o determinadas enfermedades pueden causar la hinchazn del tnel. Esta hinchazn comprime el nervio principal de la [place]. El nervio principal de la [place] se llama nervio mediano. Cules son las causas? Esta afeccin puede ser causada por lo siguiente: Movimientos repetidos y enrgicos de la [place] y Insurance risk surveyor. Lesiones en la [place]. Artritis. Un quiste o un tumor en el tnel carpiano. Acumulacin de lquido Solicitor. Uso de herramientas que vibran. A veces, se desconoce la causa de esta afeccin. Qu incrementa el riesgo? Los siguientes factores pueden hacer que sea ms propenso a Armed forces training and education officer afeccin: Tener un trabajo que requiera que mueva la mueca o la mano repetitivamente o enrgicamente o que utilice herramientas que vibran. Estos pueden ser, [employer], los trabajos que implican usar una computadora, trabajar en una lnea de ensamblaje o trabajar con herramientas elctricas como taladros y lijadoras. Ser mujer. Tener ciertas afecciones, tales como: Diabetes. Obesidad. Tiroides hipoactiva (hipotiroidismo). Insuficiencia renal. Artritis reumatoide. Cules son los signos o sntomas? Los sntomas de esta afeccin incluyen: Sensacin de hormigueo en los dedos de la mano, especialmente el pulgar, el ndice y el dedo [place]. Hormigueo o adormecimiento en la mano. Sensacin de Social research officer, government en todo el brazo, especialmente cuando la [place] y el codo estn flexionados durante [place]. Dolor en la mueca que sube por el brazo hasta el hombro. Dolor que baja hasta la palma de la mano o los dedos. Sensacin de Arvin[employer]itor. Tal vez tenga dificultad para tomar y Licensed conveyancer. Los sntomas pueden empeorar durante la noche. Cmo se diagnostica? Esta afeccin se diagnostica mediante los antecedentes mdicos y un examen fsico. Tambin pueden hacerle estudios, que incluyen los siguientes: Un  electromiograma (EMG). Esta prueba mide las seales elctricas que los nervios les envan a los msculos. Estudio de Target Corporation. Este estudio permite determinar si las seales elctricas pasan correctamente por los nervios. Estudios de diagnstico por imgenes, como radiografas, una ecografa y Public house manager (RM). Estos estudios Chiropodist las posibles causas de la afeccin. Cmo se trata? El tratamiento de esta afeccin puede incluir: Cambios en el estilo de vida. Es importante que deje o cambie la actividad que caus la afeccin. Hacer ejercicio y actividades para fortalecer y Training and development officer los msculos y los tendones (fisioterapia). Hacer cambios en el estilo de vida que lo ayuden con su afeccin y aprender a Optometrist sus actividades diarias de forma segura (terapia ocupacional). Analgsicos y antiinflamatorios. Esto puede incluir medicamentos que se inyectan en la [place]. Una frula o un dispositivo ortopdico para la [place]. [place]. Siga estas instrucciones en su casa: Si tiene una frula o un dispositivo ortopdico: Use la frula o el dispositivo ortopdico como se lo haya indicado el mdico. Quteselos solamente como se lo haya indicado el mdico. Afloje la frula o  el dispositivo ortopdico si los dedos de las manos se le adormecen, siente hormigueos o se le enfran y se tornan de Optician, dispensing. Mantenga la frula o el dispositivo ortopdico limpios. Si la frula o el dispositivo ortopdico no son impermeables: No deje que se mojen. Cbralos con un envoltorio hermtico cuando tome un bao de inmersin o una ducha. Control del dolor, la rigidez y la hinchazn Si se lo indican, aplique hielo sobre la zona dolorida. Para hacer esto: Si tiene una frula o un dispositivo ortopdico desmontable, quteselos como se lo haya indicado el mdico. Ponga el hielo en una bolsa plstica. Coloque una [employer] piel y la [place], o entre la frula o dispositivo ortopdico y Administrator. Aplique el hielo durante 20 minutos, 2 o 3 veces por da. No se quede dormido con la bolsa de hielo [employer] Foods. Retire el hielo si la piel se pone de color rojo brillante. Esto es PepsiCo. Si no puede sentir dolor, calor o fro, tiene un mayor riesgo de que se dae la zona. Mueva los dedos con frecuencia para reducir la rigidez y la hinchazn. Instrucciones generales Use los medicamentos de venta libre y los recetados solamente como se lo haya indicado el mdico. Descanse la [place] y la mano de toda actividad que le cause dolor. Si la afeccin tiene relacin con Leander Rams, hable con su empleador Countrywide Financial pueden [place], por [place], usar una almohadilla para apoyar la mueca mientras tipea. Haga los ejercicios como se lo hayan indicado el mdico, el fisioterapeuta o el terapeuta ocupacional. Cumpla con todas las visitas de seguimiento. Esto es importante. Comunquese con un mdico si: Aparecen nuevos sntomas. El dolor no se alivia con los [employer]ne[employer]. Sus sntomas empeoran. Solicite ayuda de inmediato si: Tiene hormigueo o adormecimiento intensos en la mueca o la mano. Resumen El sndrome del tnel carpiano es una afeccin que causa dolor, adormecimiento y debilidad en la mano y los dedos. Generalmente se debe a movimientos repetidos de Arrow Electronics. El sndrome del tnel carpiano se trata mediante cambios en el estilo de vida y medicamentos. Tambin puede indicarse la [place]. Siga las instrucciones del mdico sobre el uso de una frula, el descanso de la [place], la asistencia a las visitas de seguimiento y Solicitor para pedir ayuda. Esta informacin no tiene Marine scientist el consejo del mdico. Asegrese de hacerle al mdico cualquier pregunta que tenga. Document Revised: 04/06/2020 Document Reviewed: 04/06/2020 Elsevier Patient Education  [place] Spondylolisthesis La espndilolistesis ocurre cuando uno de los huesos de la  columna (vrtebra) se desliza hacia adelante y se sale de Environmental consultant. Esto es frecuente en la parte inferior de la espalda (columna lumbar), pero puede ocurrir en cualquier lugar de la columna. Cules son las causas? Esta afeccin puede ser causada por lo siguiente: Ardelia Mems rotura o grieta (fractura por sobrecarga) en un hueso de la columna vertebral por practicar deportes o realizar actividades fsicas que: Sobrecargan los Smith International parte inferior de la espalda. Implican un estiramiento excesivo (hiperextensin) de la colum[place]. Una lesin (traumatismo) por un accidente. El desgaste que ocurre a medida que una persona envejece. Qu incrementa el riesgo? Los siguientes factores pueden hacer que sea ms propenso a Emergency planning/management officer afeccin: Participar en deportes o actividades tales como: Gimnasia. Hacer patinaje artstico. Saverio Danker peso. Ftbol americano. Tener una afeccin que Enterprise Products, como artrosis o Hotel manager. Tener sobrepeso. Cules son los signos o los sntomas? Los sntomas de Personnel officer  pueden incluir los siguientes: Dolor de leve a intenso en las piernas, las nalgas o la parte inferior de la espalda. Una forma de caminar anormal (marcha anormal). Mala postura. Rigidez muscular, especficamente en los isquiotibiales. Los isquiotibiales estn en la parte posterior de los muslos. Debilidad, adormecimiento o sensacin de hormigueo en las piernas. Dolor en el cuello, si la lesin se encuentra en la parte superior de la columna. Los sntomas pueden empeorar al estar de pie y pueden mejorar temporalmente al estar sentado o inclinarse hacia adelante. En algunos casos, puede no haber sntomas de esta afeccin. Cmo se diagnostica? Esta afeccin se puede diagnosticar en funcin de lo siguiente: Sus sntomas. Sus antecedentes mdicos. Un examen fsico. Pruebas de diagnstico por imgenes, por ejemplo: Radiografas. Exploracin por tomografa computarizada (TC). Una resonancia  magntica (RM). Cmo se trata? El tratamiento para esta afeccin puede incluir lo siguiente: Hacer reposo. Analgsicos. Antiinflamatorios no esteroideos Dayna Ramus) para reducir la hinchazn y [employer] Department Stores. Recibir inyecciones de medicamentos (corticoides) en la espalda. Estas inyecciones pueden ayudar a Best boy y el adormecimiento. Usar un dispositivo ortopdico para Passenger transport manager y Teacher, music. Realizar fisioterapia. Puede trabajar con un terapeuta ocupacional o un fisioterapeuta, que pueden ensearle a reducir la presin en la espalda mientras hace las actividades cotidianas. Clementeen Hoof. Esta puede ser AT&T siguientes casos: Otros mtodos de tratamiento no mejoran su afeccin. Los sntomas no desaparecen despus de 3 a 6 meses. Tiene cambios en el control de las heces o la orina. No puede pararse ni caminar. Siente dolor intenso. Siga estas indicaciones en su casa: Medicamentos Delphi de venta libre y los recetados solamente como se lo haya indicado el mdico. Pregntele al mdico si el medicamento recetado: Hace que sea necesario que evite conducir o usar maquinaria pesada. Puede causarle estreimiento. Es posible que tenga que tomar estas medidas para prevenir o tratar el estreimiento: Beba suficiente lquido como para Theatre manager la orina de color amarillo plido. Tome medicamentos recetados o de venta [place]. Consuma alimentos ricos en fibra, como frijoles, cereales integrales, y frutas y verduras frescas. Limite el consumo de alimentos ricos en grasa y azcares procesados, como los alimentos fritos o dulces. Si tiene un dispositivo ortopdico: selo como se lo haya indicado el mdico. Quteselo solamente como se lo haya indicado el mdico. Mantenga limpio el dispositivo ortopdico. Si el dispositivo ortopdico no es impermeable: No deje que se moje. Cbralo con un envoltorio hermtico cuando tome un bao de inmersin o una  ducha. Actividad Descanse y retome sus actividades normales como se lo haya indicado el mdico. Pregntele al mdico qu actividades son seguras para usted. Pregntele al mdico cundo puede volver a Forensic psychologist, si tiene un dispositivo ortopdico en la espalda. Trabaje con un fisioterapeuta para crear un programa de ejercicios seguros, segn lo recomiende el mdico. Haga ejercicios como se lo haya indicado el fisioterapeuta. Esto puede incluir ejercicios para fortalecer la espalda y los msculos abdominales (ejercicios para fortalecer los msculos del tronco). Control del dolor, la rigidez y la hinchazn   Si se lo indican, aplique hielo sobre la zona afectada. Si tiene un dispositivo ortopdico desmontable, quteselo como se lo haya indicado el mdico. Ponga el hielo en una bolsa plstica. Coloque una toalla entre la piel y Therapist, nutritional. Coloque el hielo durante 20 minutos, 2 a 3 veces por da. Si se lo indican, aplique calor en la zona afectada con la frecuencia que le haya indicado el mdico. Use la fuente de calor  que el V[employer]om recomiende, como una compresa de calor hmedo o una almohadilla trmica. Si tiene un dispositivo ortopdico desmontable, quteselo como se lo haya indicado el mdico. Colquese una toalla entre la piel y la fuente de Freight forwarder. Aplique calor durante 20 a 30 minutos. Retire la fuente de calor si la piel se pone de color rojo brillante. Esto es especialmente importante si no puede sentir dolor, calor o fro. Puede correr un riesgo mayor de sufrir quemaduras. Indicaciones generales No consuma ningn producto que contenga nicotina o tabaco, como cigarrillos, cigarrillos electrnicos y tabaco de Higher education careers adviser. Estos pueden retrasar la consolidacin del [place]. Si necesita ayuda para dejar de fumar, consulte al mdico. Si tiene sobrepeso, trabaje con el mdico y un nutricionista para establecer una meta de prdida de peso que sea saludable y razonable para usted. Concurra a todas las  visitas de seguimiento como se lo haya indicado el mdico. Esto es importante. Comunquese con un mdico si: Tiene un dolor que empeora o que no mejora. Solicite ayuda inmediatamente si: Siente un dolor intenso en la espalda o en el cuello. Tiene cambios en el control de las heces o la orina. Siente debilidad o adormecimiento en las piernas. Nota que no puede pararse o caminar. Resumen La espndilolistesis ocurre cuando uno de los huesos de la columna (vrtebra) se desliza hacia adelante y se sale de Environmental consultant. Esta afeccin puede tratarse con reposo, medicamentos, el uso de un dispositivo ortopdico, fisioterapia o [place]. Descanse y retome sus actividades normales como se lo haya indicado el mdico. Pregntele al mdico qu actividades son seguras para usted. Comunquese con el mdico si tiene dolor que empeora o no mejora. Esta informacin no tiene Marine scientist el consejo del mdico. Asegrese de hacerle al mdico cualquier pregunta que tenga. Document Revised: 06/22/2018 Document Reviewed: 06/22/2018 Elsevier Patient Education  2022 Oil Trough distensin y esguince cervical Cervical Strain and Sprain Rehab Pregunte al mdico qu ejercicios son seguros para usted. Haga los ejercicios exactamente como se lo haya indicado el mdico y gradelos como se lo hayan indicado. Es normal sentir un estiramiento leve, tironeo, opresin o Tree surgeon al [employer] Jessie. Detngase de inmediato si siente un dolor repentino o Printmaker. No comience a hacer estos ejercicios hasta que se lo indique el mdico. Ejercicios de elongacin y amplitud de movimiento Inclinacin cervical hacia un lado  Sintese en una silla estable o pngase de pie y [place]. Sin mover los hombros, incline lentamente el lado izquierdo/derecho de la cabeza y lleve la oreja hacia el hombro hasta sentir un estiramiento en los msculos del cuello en el lado opuesto. Mantenga la  mirada al frente. Mantenga esta posicin durante __________ segundos. Repita el ejercicio con el otro lado del cuello. Repita __________ veces. Realice este ejercicio __________ veces al da. Rotacin cervical  Sintese en una silla estable o pngase de pie y [place]. Lentamente, gire la cabeza hacia un lado, como si quisiera mirar por encima del hombro izquierdo/derecho. Mantenga la mirada paralela al suelo. Detngase cuando sienta un estiramiento en el costado y en la parte de atrs del cuello. Mantenga esta posicin durante __________ segundos. Para repetir el ejercicio, gire hacia el otro lado. Repita __________ veces. Realice este ejercicio __________ veces al da. Extensin torcica y estiramiento pectoral Use una toalla o una manta pequea para armar un rollo de 4 pulgadas (10 cm) de dimetro. Acustese boca arriba sobre una superficie firme. Coloque la toalla a  lo largo, debajo de la columna en la parte media de la espalda. No debe estar debajo de los omplatos. La toalla debe estar alineada con la columna, desde la parte media hasta la parte baja de la espalda. Coloque las manos detrs de la cabeza y deje caer los codos [place] lados. Mantenga esta posicin durante __________ segundos. Repita __________ veces. Realice este ejercicio __________ veces al da. Ejercicios de fortalecimiento Flexin cervical superior, isomtrica Acustese boca arriba con una almohada fina detrs de la cabeza y Ardelia Mems toalla pequea enrollada debajo del cuello. Suavemente, hunda el [employer] pecho y lleve la cabeza hacia abajo para mirar en direccin a los pies. No levante la cabeza de la almohada. Mantenga esta posicin durante __________ segundos. Afloje lentamente la tensin. Relaje los msculos por completo antes de repetir el ejercicio. Repita __________ veces. Realice este ejercicio __________ veces al da. Extensin cervical, isomtrica  Prese frente a una pared, a una  distancia aproximada de 6 pulgadas (15 cm), con la espalda contra la pared. Coloque un objeto blando, de unas 6 a 8 pulgadas (15 a 20 cm) de dimetro, entre la nuca y la pared. Puede ser Fermin Schwab pequea, una pelota o una toalla doblada. Incline suavemente la cabeza hacia atrs y [place] presin sobre el objeto blando. Mantenga relajadas la mandbula y la frente. Mantenga esta posicin durante __________ segundos. Afloje lentamente la tensin. Relaje los msculos por completo antes de repetir el ejercicio. Repita __________ veces. Realice este ejercicio __________ veces al da. Postura y Mudlogger se refiere a los movimientos y a las posiciones del cuerpo mientras realiza las actividades diarias. La postura es una parte de la Therapist, nutritional. La buena postura y la Engineer, agricultural corporal saludable pueden ayudar a Theatre stage manager estrs en las articulaciones y los tejidos del cuerpo. La buena postura significa que la columna mantiene su posicin natural de curvatura en forma de S (la columna est en una posicin neutral), los hombros Lucianne Lei un poco hacia atrs y la cabeza no se inclina hacia adelante. A continuacin, se incluyen pautas generales para mejorar la postura y Quarry manager en las actividades diarias. Sentado  Cuando est sentado, mantenga la columna en posicin neutral y deje los pies apoyados en el suelo. Use un apoyapis, si es necesario, y [employer] muslos paralelos al suelo. Evite redondear los hombros e inclinar la cabeza hacia adelante. Cuando trabaje en un escritorio o con una computadora, el escritorio debe estar a una altura en la que las manos estn un poco ms abajo que los codos. Deslice la silla debajo del escritorio, de modo de estar lo suficientemente cerca como para mantener una buena [place]. Cuando trabaje con una computadora, coloque el monitor a una altura que le permita mirar derecho hacia adelante, sin tener que inclinar la cabeza hacia  adelante o Aurelia atrs. De pie  Cuando est de pie, mantenga la columna en posicin neutral y los pies separados aproximadamente al ancho de caderas. Mantenga las rodillas ligeramente flexionadas. [place], los hombros y las caderas deben estar alineados. Cuando realice una tarea en la que deba estar de pie en el mismo sitio durante mucho tiempo, coloque un pie en un objeto estable de 2 a 4 pulgadas (5 a 10 cm) de alto, como un taburete. Esto ayuda a que la columna [employer] posicin neutral. Reposo Al descansar o estar acostado, evite las posiciones que le causen ms dolor. Intente sostener el cuello en una posicin neutral.  Puede usar una almohada anatmica o una toalla pequea enrollada. La almohada debe sostenerle el cuello, pero no empujarlo. Esta informacin no tiene Marine scientist el consejo del mdico. Asegrese de hacerle al mdico cualquier pregunta que tenga. Document Revised: 09/06/2018 Document Reviewed: 09/06/2018 Elsevier Patient Education  [place]: Avoid overhead lifting and overhead use of the arms. Do not lift greater than 5 lbs. Adjust head rest in vehicle to prevent hyperextension if rear ended. Take extra precautions to avoid falling. Avoid bending, stooping and avoid lifting weights greater than 10 lbs. Avoid prolong standing and walking. Avoid frequent bending and stooping  No lifting greater than 10 lbs. May use ice or moist heat for pain. Weight loss is of benefit. Handicap license is approved. Exercises and information in spanish provided for patient to review She functions at a high level inspite of the conditions that she has and I think it is to early for a decision to do  Surgery. She does not wish to have an operation and I think the risks outweight the benefits. She should be followed yearly for worsening of the back and return if the Carpal tunnel symptoms and shoulder symptoms worsen.

## 2021-11-27 NOTE — Progress Notes (Signed)
Office Visit Note   Patient: Kendra Rodriguez           Date of Birth: 1966/10/03           MRN: [id number] Visit Date: 11/27/2021              Requested by: Horald Pollen, [place],  [place] [id number] PCP: Horald Pollen, MD   Assessment & Plan: Visit Diagnoses:  1. Spondylolisthesis, lumbar region   2. Cervicalgia   3. Bilateral carpal tunnel syndrome   4. Adhesive capsulitis of right shoulder   5. Tendonitis of both shoulders   6. Spondylosis without myelopathy or radiculopathy, cervical region   7. Peroneal tendinitis of right lower extremity     Plan: Avoid overhead lifting and overhead use of the arms. Do not lift greater than 5 lbs. Adjust head rest in vehicle to prevent hyperextension if rear ended. Take extra precautions to avoid falling. Avoid bending, stooping and avoid lifting weights greater than 10 lbs. Avoid prolong standing and walking. Avoid frequent bending and stooping  No lifting greater than 10 lbs. May use ice or moist heat for pain. Weight loss is of benefit. Handicap license is approved. Exercises and information in spanish provided for patient to review She functions at a high level inspite of the conditions that she has and I think it is to early for a decision to do  Surgery. She does not wish to have an operation and I think the risks outweight the benefits. She should be followed yearly for worsening of the back and return if the Carpal tunnel symptoms and shoulder symptoms worsen.   Follow-Up Instructions: Return in about 4 weeks (around 12/25/2021).   Orders:  Orders Placed This Encounter  Procedures   XR Lumbar Spine 2-3 Views   No orders of the defined types were placed in this encounter.     Procedures: No procedures performed   Clinical Data: No additional findings.   Subjective: Chief Complaint  Patient presents with   Neck - Follow-up   Lower Back - Follow-up   Right Hand - Nail  Problem   Left Hand - Follow-up    56 year old right handed female with history of low back pain and bilateral CTS. She complains of bilateral shoulder pain for one year. She has low back pain also and the pain is present to the same degree in both areas. She has insurance but [facility] does not take this. She has been to PT for her neck and shoulders and for her low back. She reports numbness and tingling persists in the hands and low back is still painful. The pain is about a 6 during the day and in the AM she is usually in the AM. She is having discomfort that is unchanged. Able to grocery shop and to walk. 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    Review of Systems  Constitutional: Negative.   HENT: Negative.    Eyes: Negative.   Respiratory: Negative.    Cardiovascular: Negative.   Gastrointestinal: Negative.   Endocrine: Negative.   Genitourinary: Negative.   Musculoskeletal: Negative.   Skin: Negative.   Allergic/Immunologic: Negative.   Neurological: Negative.   Hematological: Negative.   Psychiatric/Behavioral: Negative.      Objective: Vital Signs: BP 128/75 (BP Location: Left Arm, Patient Position: Sitting)    Pulse 66    Ht 5\' 3"  (1.6 m)    Wt  188 lb (85.3 kg)    BMI 33.30 kg/m   Physical Exam Constitutional:      Appearance: She is well-developed.  HENT:     Head: Normocephalic and atraumatic.  Eyes:     Pupils: Pupils are equal, round, and reactive to light.  Pulmonary:     Effort: Pulmonary effort is normal.     Breath sounds: Normal breath sounds.  Abdominal:     General: Bowel sounds are normal.     Palpations: Abdomen is soft.  Musculoskeletal:        General: Normal range of motion.     Cervical back: Normal range of motion and neck supple.     Lumbar back: Negative right straight leg raise test and negative left straight leg raise test.  Skin:    General: Skin is warm and dry.  Neurological:     Mental Status: She is alert and oriented to  person, place, and time.  Psychiatric:        Behavior: Behavior normal.        Thought Content: Thought content normal.        Judgment: Judgment normal.    Back Exam   Tenderness  The patient is experiencing tenderness in the cervical and lumbar.  Range of Motion  Extension:  normal  Flexion:  normal  Lateral bend right:  normal  Lateral bend left:  normal  Rotation right:  normal  Rotation left:  normal   Muscle Strength  Right Quadriceps:  5/5  Left Quadriceps:  5/5  Right Hamstrings:  5/5  Left Hamstrings:  5/5   Tests  Straight leg raise right: negative Straight leg raise left: negative  Reflexes  Patellar:  2/4 Achilles:  2/4  Other  Toe walk: normal Heel walk: normal Sensation: decreased Erythema: no back redness Scars: absent  Comments:  She is on her feet all day and cooking all day and this is painful.      Specialty Comments:  No specialty comments available.  Imaging: No results found.   PMFS History: Patient Active Problem List   Diagnosis Date Noted   Post-COVID syndrome 06/04/2021   Hyperthyroidism 03/29/2021   Body mass index (BMI) of 35.0-35.9 in adult 06/04/2020   History of migraine headaches 12/06/2019   Dyslipidemia 06/30/2019   GERD with esophagitis 06/30/2019   Prediabetes 10/17/2015   Spinal stenosis, lumbar region, with neurogenic claudication 10/24/2014   Past Medical History:  Diagnosis Date   Acute sinusitis    Allergy    Anxiety    Depression    NO MEDICATIONS - DOING OK NOW   Dizziness    Ear ache    RIGHT --HX OF MULTIPLE EAR INFECTIONS AS A CHILD    GERD (gastroesophageal reflux disease)    Headache(784.0)    MIGRAINES   Leg pain, left    with swelling   Lumbar stenosis    PAIN BACK AND LEFT LEG AND NUMBNESS LEG AND FOOT   Peripheral vascular disease (HCC)    HX OF TREATMENT FOR VARICOSE VEINS RIGHT LEG   Thyroid disease    TOLD BLOOD LEVELS SLIGHTLY ABNORMAL - BUT NOT REQUIRED MEDICATION    Family  History  Problem Relation Age of Onset   Hypertension Mother    Arthritis Mother    Diabetes Mother    Hyperlipidemia Mother    Arthritis Sister    Rheum arthritis Brother    Autoimmune disease Brother    Lung disease Brother  Arthritis Sister    Gout Son    High Cholesterol Daughter    Thyroid disease Neg Hx     Past Surgical History:  Procedure Laterality Date   ABDOMINAL HYSTERECTOMY     BACK SURGERY     bladder lift     AT SAME TIME OF HYSTERECTOMY   BRAVO [place] STUDY N/A 07/26/2021   Procedure: BRAVO [place] STUDY;  Surgeon: Carol Ada, MD;  Location: [facility];  Service: Endoscopy;  Laterality: N/A;   CHOLECYSTECTOMY     ESOPHAGOGASTRODUODENOSCOPY (EGD) WITH PROPOFOL N/A 07/26/2021   Procedure: ESOPHAGOGASTRODUODENOSCOPY (EGD) WITH PROPOFOL;  Surgeon: Carol Ada, MD;  Location: [facility];  Service: Endoscopy;  Laterality: N/A;   LUMBAR LAMINECTOMY/DECOMPRESSION MICRODISCECTOMY Left 10/24/2014   Procedure: COMPLETE LUMBAR DECOMPRESSION L4-L5 CENTRAL AND MICRODISCECTOMY L4-L5 LEFT;  Surgeon: Tobi Bastos, MD;  Location: [facility];  Service: Orthopedics;  Laterality: Left;   POLYPECTOMY  07/26/2021   Procedure: POLYPECTOMY;  Surgeon: Carol Ada, MD;  Location: [facility];  Service: Endoscopy;;   Social History   Occupational History   Not on file  Tobacco Use   Smoking status: Never   Smokeless tobacco: Never  Vaping Use   Vaping Use: Never used  Substance and Sexual Activity   Alcohol use: No   Drug use: No   Sexual activity: Not on file

## 2021-11-29 ENCOUNTER — Ambulatory Visit (INDEPENDENT_AMBULATORY_CARE_PROVIDER_SITE_OTHER): Payer: Managed Care, Other (non HMO) | Admitting: Endocrinology

## 2021-11-29 ENCOUNTER — Other Ambulatory Visit: Payer: Self-pay

## 2021-11-29 VITALS — BP 130/64 | HR 83 | Ht 63.0 in | Wt 192.2 lb

## 2021-11-29 DIAGNOSIS — R7303 Prediabetes: Secondary | ICD-10-CM | POA: Diagnosis not present

## 2021-11-29 DIAGNOSIS — E039 Hypothyroidism, unspecified: Secondary | ICD-10-CM

## 2021-11-29 NOTE — Patient Instructions (Addendum)
Blood tests are requested for you today.  We'll let you know about the results.  Please come back for a follow-up appointment in May.   Se solicitan anlisis de sangre para usted hoy. Te informaremos [employer]. Por favor regrese para una cita de seguimiento en mayo.

## 2021-11-29 NOTE — Progress Notes (Signed)
Subjective:    Patient ID: Kendra Rodriguez, female    DOB: 08/10/1966, 56 y.o.   MRN: [id number]  HPI Dtr translates.  Pt returns for f/u of subacute thyroiditis (dx'ed 2022, when she presented with hyperthyroidism; she spontaneously developed hypothyroidism, and was rx'ed synthroid since rx; she has never had thyroid imaging).  pt states she feels well in general, except for excessive appetite.    She takes metformin for hyperglycemia since 2019.  Past Medical History:  Diagnosis Date   Acute sinusitis    Allergy    Anxiety    Depression    NO MEDICATIONS - DOING OK NOW   Dizziness    Ear ache    RIGHT --HX OF MULTIPLE EAR INFECTIONS AS A CHILD    GERD (gastroesophageal reflux disease)    Headache(784.0)    MIGRAINES   Leg pain, left    with swelling   Lumbar stenosis    PAIN BACK AND LEFT LEG AND NUMBNESS LEG AND FOOT   Peripheral vascular disease (HCC)    HX OF TREATMENT FOR VARICOSE VEINS RIGHT LEG   Thyroid disease    TOLD BLOOD LEVELS SLIGHTLY ABNORMAL - BUT NOT REQUIRED MEDICATION    Past Surgical History:  Procedure Laterality Date   ABDOMINAL HYSTERECTOMY     BACK SURGERY     bladder lift     AT SAME TIME OF HYSTERECTOMY   BRAVO [place] STUDY N/A 07/26/2021   Procedure: BRAVO [place] STUDY;  Surgeon: Jeani Hawking, MD;  Location: [facility];  Service: Endoscopy;  Laterality: N/A;   CHOLECYSTECTOMY     ESOPHAGOGASTRODUODENOSCOPY (EGD) WITH PROPOFOL N/A 07/26/2021   Procedure: ESOPHAGOGASTRODUODENOSCOPY (EGD) WITH PROPOFOL;  Surgeon: Jeani Hawking, MD;  [facility];  Service: Endoscopy;  Laterality: N/A;   LUMBAR LAMINECTOMY/DECOMPRESSION MICRODISCECTOMY Left 10/24/2014   Procedure: COMPLETE LUMBAR DECOMPRESSION L4-L5 CENTRAL AND MICRODISCECTOMY L4-L5 LEFT;  Surgeon: Jacki Cones, MD;  Location: [facility];  Service: Orthopedics;  Laterality: Left;   POLYPECTOMY  07/26/2021   Procedure: POLYPECTOMY;  Surgeon: Jeani Hawking, MD;  Location: [facility];  Service:  Endoscopy;;    Social History   Socioeconomic History   Marital status: Married    Spouse name: Not on file   Number of children: Not on file   Years of education: Not on file   Highest education level: Not on file  Occupational History   Not on file  Tobacco Use   Smoking status: Never   Smokeless tobacco: Never  Vaping Use   Vaping Use: Never used  Substance and Sexual Activity   Alcohol use: No   Drug use: No   Sexual activity: Not on file  Other Topics Concern   Not on file  Social History Narrative   ** Merged History Encounter **       Social Determinants of Health   Financial Resource Strain: Not on file  Food Insecurity: Not on file  Transportation Needs: Not on file  Physical Activity: Not on file  Stress: Not on file  Social Connections: Not on file  Intimate Partner Violence: Not on file    Current Outpatient Medications on File Prior to Visit  Medication Sig Dispense Refill   acetaminophen (TYLENOL) 500 MG tablet Take 1,000 mg by mouth every 6 (six) hours as needed for headache.     atorvastatin (LIPITOR) 40 MG tablet TAKE 1 TABLET(40 MG) BY MOUTH DAILY 90 tablet 3   cetirizine (ZYRTEC ALLERGY) 10 MG tablet Take 1 tablet (  10 mg total) by mouth daily. (Patient taking differently: Take 10 mg by mouth daily as needed for allergies.) 30 tablet 0   cholecalciferol (VITAMIN D3) 25 MCG (1000 UNIT) tablet Take 1,000 Units by mouth daily.     diazepam (VALIUM) 5 MG tablet Take 1 hour prior to MRI, may repeat x 1 2 tablet 0   diclofenac Sodium (VOLTAREN) 1 % GEL Apply 2 g topically 4 (four) times daily. (Patient taking differently: Apply 2 g topically 4 (four) times daily as needed (pain).) 350 g 3   famotidine (PEPCID) 40 MG tablet Take 40 mg by mouth 2 (two) times daily.     fluticasone (FLONASE) 50 MCG/ACT nasal spray Place 2 sprays into both nostrils daily. (Patient taking differently: Place 2 sprays into both nostrils daily as needed for allergies.) 16 mL 0    levothyroxine (SYNTHROID) 50 MCG tablet Take 1 tablet (50 mcg total) by mouth daily. 90 tablet 3   metFORMIN (GLUCOPHAGE-XR) 500 MG 24 hr tablet Take 3 tablets (1,500 mg total) by mouth daily. 270 tablet 3   ondansetron (ZOFRAN) 4 MG tablet Take 1 tablet (4 mg total) by mouth every 8 (eight) hours as needed (for nausea associated with migraines). 20 tablet 3   pantoprazole (PROTONIX) 40 MG tablet Take 40 mg by mouth daily before breakfast.     sucralfate (CARAFATE) 1 g tablet Take 1 g by mouth 3 (three) times daily.     XIIDRA 5 % SOLN Place 1 drop into both eyes 2 (two) times daily.     zolmitriptan (ZOMIG) 5 MG tablet Take 1 tab prn for migraines, may repeat x 1 after 2 hours if needed.  MDD 2 10 tablet 5   No current facility-administered medications on file prior to visit.    No Known Allergies  Family History  Problem Relation Age of Onset   Hypertension Mother    Arthritis Mother    Diabetes Mother    Hyperlipidemia Mother    Arthritis Sister    Rheum arthritis Brother    Autoimmune disease Brother    Lung disease Brother    Arthritis Sister    Gout Son    High Cholesterol Daughter    Thyroid disease Neg Hx     BP 130/64    Pulse 83    Ht 5\' 3"  (1.6 m)    Wt 192 lb 3.2 oz (87.2 kg)    SpO2 97%    BMI 34.05 kg/m    Review of Systems     Objective:   Physical Exam    Lab Results  Component Value Date   HGBA1C 6.2 (H) 11/29/2021   Lab Results  Component Value Date   TSH 0.589 11/29/2021      Assessment & Plan:  Hypothyroidism: well-controlled.  Please continue the same synthroid Hyperglycemia: stable.  Please continue the same metformin

## 2021-11-30 LAB — T4, FREE: Free T4: 1.61 ng/dL (ref 0.82–1.77)

## 2021-11-30 LAB — HEMOGLOBIN A1C
Est. average glucose Bld gHb Est-mCnc: 131 mg/dL
Hgb A1c MFr Bld: 6.2 % — ABNORMAL HIGH (ref 4.8–5.6)

## 2021-11-30 LAB — TSH: TSH: 0.589 u[IU]/mL (ref 0.450–4.500)

## 2022-02-12 ENCOUNTER — Other Ambulatory Visit: Payer: Self-pay | Admitting: Emergency Medicine

## 2022-02-12 DIAGNOSIS — E785 Hyperlipidemia, unspecified: Secondary | ICD-10-CM

## 2022-03-06 ENCOUNTER — Ambulatory Visit (INDEPENDENT_AMBULATORY_CARE_PROVIDER_SITE_OTHER): Payer: Commercial Managed Care - HMO | Admitting: Emergency Medicine

## 2022-03-06 ENCOUNTER — Encounter: Payer: Self-pay | Admitting: Emergency Medicine

## 2022-03-06 VITALS — BP 140/78 | HR 68 | Temp 97.9°F | Ht 63.0 in | Wt 188.4 lb

## 2022-03-06 DIAGNOSIS — E785 Hyperlipidemia, unspecified: Secondary | ICD-10-CM | POA: Diagnosis not present

## 2022-03-06 DIAGNOSIS — R7303 Prediabetes: Secondary | ICD-10-CM

## 2022-03-06 DIAGNOSIS — L723 Sebaceous cyst: Secondary | ICD-10-CM

## 2022-03-06 DIAGNOSIS — K2100 Gastro-esophageal reflux disease with esophagitis, without bleeding: Secondary | ICD-10-CM

## 2022-03-06 DIAGNOSIS — Z8719 Personal history of other diseases of the digestive system: Secondary | ICD-10-CM

## 2022-03-06 DIAGNOSIS — R42 Dizziness and giddiness: Secondary | ICD-10-CM

## 2022-03-06 DIAGNOSIS — Z8669 Personal history of other diseases of the nervous system and sense organs: Secondary | ICD-10-CM

## 2022-03-06 DIAGNOSIS — E039 Hypothyroidism, unspecified: Secondary | ICD-10-CM | POA: Diagnosis not present

## 2022-03-06 LAB — CBC WITH DIFFERENTIAL/PLATELET
Basophils Absolute: 0 10*3/uL (ref 0.0–0.1)
Basophils Relative: 0.6 % (ref 0.0–3.0)
Eosinophils Absolute: 0.1 10*3/uL (ref 0.0–0.7)
Eosinophils Relative: 1.1 % (ref 0.0–5.0)
HCT: 37.4 % (ref 36.0–46.0)
Hemoglobin: 12.7 g/dL (ref 12.0–15.0)
Lymphocytes Relative: 36.3 % (ref 12.0–46.0)
Lymphs Abs: 1.7 10*3/uL (ref 0.7–4.0)
MCHC: 34 g/dL (ref 30.0–36.0)
MCV: 82 fl (ref 78.0–100.0)
Monocytes Absolute: 0.4 10*3/uL (ref 0.1–1.0)
Monocytes Relative: 7.8 % (ref 3.0–12.0)
Neutro Abs: 2.6 10*3/uL (ref 1.4–7.7)
Neutrophils Relative %: 54.2 % (ref 43.0–77.0)
Platelets: 272 10*3/uL (ref 150.0–400.0)
RBC: 4.56 Mil/uL (ref 3.87–5.11)
RDW: 12.7 % (ref 11.5–15.5)
WBC: 4.8 10*3/uL (ref 4.0–10.5)

## 2022-03-06 LAB — COMPREHENSIVE METABOLIC PANEL
ALT: 13 U/L (ref 0–35)
AST: 14 U/L (ref 0–37)
Albumin: 4.4 g/dL (ref 3.5–5.2)
Alkaline Phosphatase: 86 U/L (ref 39–117)
BUN: 18 mg/dL (ref 6–23)
CO2: 29 mEq/L (ref 19–32)
Calcium: 9.5 mg/dL (ref 8.4–10.5)
Chloride: 102 mEq/L (ref 96–112)
Creatinine, Ser: 0.66 mg/dL (ref 0.40–1.20)
GFR: 97.93 mL/min (ref >60.00)
Glucose, Bld: 111 mg/dL — ABNORMAL HIGH (ref 70–99)
Potassium: 4 mEq/L (ref 3.5–5.1)
Sodium: 138 mEq/L (ref 135–145)
Total Bilirubin: 0.6 mg/dL (ref 0.2–1.2)
Total Protein: 7.6 g/dL (ref 6.0–8.3)

## 2022-03-06 LAB — LIPID PANEL
Cholesterol: 130 mg/dL (ref 0–200)
HDL: 52.9 mg/dL (ref >39.00)
LDL Cholesterol: 47 mg/dL (ref 0–99)
NonHDL: 76.73
Total CHOL/HDL Ratio: 2
Triglycerides: 148 mg/dL (ref 0.0–149.0)
VLDL: 29.6 mg/dL (ref 0.0–40.0)

## 2022-03-06 LAB — TSH: TSH: 0.33 u[IU]/mL — ABNORMAL LOW (ref 0.35–5.50)

## 2022-03-06 LAB — HEMOGLOBIN A1C: Hgb A1c MFr Bld: 6.1 % (ref 4.6–6.5)

## 2022-03-06 NOTE — Patient Instructions (Addendum)
Stop metformin. ?Continue all other medications. ?Blood work today. ?Follow-up in 6 months. ? ?Plan de alimentaci?n para personas con prediabetes ?Prediabetes Eating Plan ?La prediabetes es una afecci?n que hace que los niveles de az?car en la sangre (glucosa) sean m?s altos de lo normal. Esto aumenta el riesgo de desarrollar diabetes tipo 2 (diabetes mellitus tipo 2). Trabajar con un profesional de la salud o especialista en nutrici?n (nutricionista) para Teacher, English as a foreign language en la dieta y el estilo de vida puede ayudar a prevenir el inicio de la diabetes. Estos cambios pueden ayudarlo a: ?Environmental consultant de glucemia. ?Mejorar los niveles de [place]. ?Controlar la presi?n arterial. ?Consejos para seguir este plan ?Al leer las etiquetas de los alimentos ?Lea las etiquetas de los alimentos envasados para controlar la cantidad de [place], sal (sodio) y az?car que contienen. Evite los alimentos que contengan lo siguiente: ?Grasas saturadas. ?Grasas trans. ?Az?cares agregados. ?Evite los alimentos que contengan m?s de 300 miligramos (mg) de sodio por porci?n. Limite el consumo de sodio a menos de 2300 mg por d?a. ?Al ir de compras ?Evite comprar alimentos procesados y preelaborados. ?Evite comprar bebidas con az?car agregada. ?Al cocinar ?Cocine con aceite de oliva. No use mantequilla, manteca de cerdo ni mantequilla clarificada. ?Cocine los alimentos al horno, a la parrilla, asados, al vapor o hervidos. Evite fre?rlos. ?Planificaci?n de las comidas ? ?Trabaje con el nutricionista para crear un plan de alimentaci?n que sea adecuado para usted. Esto puede incluir el seguimiento de cu?ntas calor?as ingiere al d?a. Use un registro de alimentos, un cuaderno o una aplicaci?n m?vil para anotar lo que comi? en cada comida. ?Considere la posibilidad de seguir una dieta mediterr?nea. Esta puede comprender lo siguiente: ?Comer varias porciones de frutas y verduras frescas por d?a. ?Pescado al [employer] veces por semana. ?Comer  una porci?n de cereales integrales, frijoles, frutos secos y semillas por d?a. ?Aceite de Tour manager de otras grasas. ?Limitar el consumo de alcohol. ?[place]. ?Usar productos l?cteos descremados o con bajo contenido de [place]. ?Considerar seguir Ardelia Mems dieta a base de vegetales. Esta incluye hacer elecciones alimentarias que se concentren en comer principalmente verduras y frutas, cereales, frijoles, frutos secos y semillas. ?Si tiene hipertensi?n arterial, quiz?s deba limitar el consumo de sodio o seguir una dieta como el plan de alimentaci?n basado en los Enfoques Alimentarios para Detener la Hipertensi?n (Dietary Approaches to Stop Hypertension, DASH). La dieta DASH tiene como objetivo bajar la hipertensi?n arterial. ?Estilo de vida ?Establezca metas para bajar de peso con la ayuda de su equipo de atenci?n m?dica. A la mayor?a de las personas con prediabetes se les recomienda bajar un 7 % de su peso corporal. ?Sherilyn Cooter al menos 30 minutos de ejercicio, 5 o m?s d?as a la semana. ?Asista a un grupo de apoyo o solicite el apoyo de un consejero de salud mental. ?Use los medicamentos de venta libre y los recetados solamente como se lo haya indicado el m?dico. ??Qu? alimentos se recomiendan? ?Lambert Mody ?Bayas. Bananas. Manzanas. Naranjas. Uvas. Papaya. Mango. [place]. Kiwi. Pomelo. Cerezas. ?Verduras ?Valeda Malm. Espinaca. Guisantes. Remolachas. Coliflor. Repollo. Br?coli. Zanahorias. Tomates. [place]aza. Augustin Coupe. Hierbas. Pimientos. Cebollas. Pepinos. Coles de Bruselas. ?Granos ?Productos integrales, como panes, galletas, cereales y pastas de salvado o integrales. Avena sin az?car. Gavin Potters burgol. Cebada. Quinua. Arroz integral. Tacos o tortillas de harina de ma?z o de salvado. ?Carnes y otras prote?nas ?Mariscos. Carne de ave sin piel. Cortes magros de cerdo y carne de res. Tofu. Huevos. Frutos secos. Frijoles. ?L?cteos ?Productos l?cteos descremados o  semidescremados, como yogur, queso cottage y  [place]. ?Bebidas ?[place]gu[place]. T?. Caf?Y[place]uda [place] sin az?car o diet?ticas. Soda. Leche descremada o con bajo contenido de [place]. Productos alternativos a la [place], como [place] de soja o de [place]. ?Grasas y aceites ?Aceite de oliva. Aceite de canola. Aceite de girasol. Aceite de semillas de uva. [place]. Nueces. ?Dulces y postres ?Pudin sin az?car o con bajo contenido de [place]. Helado y otros postres congelados sin az?car o con bajo contenido de [place]. ?Deatra Canter?os y condimentos ?Hierbas. Especias sin sodio. Mostaza. Salsa de pepinillos. K?tchup con bajo contenido de sal y de az?car. Salsa barbacoa con bajo contenido de sal y de az?car. Mayonesa con bajo contenido de grasa o sin grasa. ?Es posible que los productos mencionados arriba no formen una lista completa de las bebidas o los alimentos recomendados. Consulte a un nutricionista para obtener m?s informaci?n. ??Qu? alimentos no se recomiendan? ?Lambert Mody ?Frutas enlatadas al alm?bar. ?Verduras ?Verduras enlatadas. Verduras congeladas con mantequilla o salsa de crema. ?Granos ?Productos elaborados con [place] y [place], como panes, pastas, bocadillos y cereales. ?Carnes y otras prote?nas ?Cortes de carne con alto contenido de Lobbyist. Carne de ave con piel. Carne empanizada o frita. Carnes procesadas. ?L?cteos ?Yogur, [place] ?Bebidas ?Bebidas azucaradas, como t? helado y gaseosas. ?Grasas y aceites ?[place]. [place]. Mantequilla clarificada. ?Dulces y postres ?Productos horneados, como pasteles, pastelitos, galletas dulces y tarta de [place]. ?Deatra Canter?os y condimentos ?Mezclas de especias con sal agregada. K?tchup. Salsa barbacoa. Mayonesa. ?Es posible que los productos que se enumeran m?s arriba no sean una lista completa de los alimentos y las bebidas que no se recomiendan. Consulte a un nutricionista para obtener m?s informaci?n. ?D?nde buscar m?s informaci?n ?American Diabetes Association (Asociaci?n Estadounidense de la Diabetes):  www.diabetes.org ?Resumen ?Es posible que deba hacer cambios en la dieta y el estilo de vida para ayudar a prevenir el inicio de la diabetes. Estos cambios pueden ayudarlo a Proofreader sangre, mejorar los niveles de colesterol y Aeronautical engineer presi?n arterial. ?Establezca metas para bajar de peso con la ayuda de su equipo de atenci?n m?dica. A la mayor?a de las personas con prediabetes se les recomienda bajar un 7 % de su peso corporal. ?Considere la posibilidad de seguir una dieta mediterr?nea. Esto incluye comer muchas frutas y verduras frescas, cereales integrales, frijoles, frutos secos, semillas, pescado y productos l?cteos con bajo contenido de grasa, y usar aceite de Tour manager de otras grasas. ?Esta informaci?n no tiene Marine scientist el consejo del m?dico. Aseg?rese de hacerle al m?dico cualquier pregunta que tenga. ?Document Revised: 04/17/2020 Document Reviewed: 04/17/2020 ?Elsevier Patient Education ? [place]. ? ?

## 2022-03-06 NOTE — Assessment & Plan Note (Signed)
Stable.  Diet and nutrition discussed.  Continue atorvastatin 40 mg daily.  Lipid profile done today. ? ?

## 2022-03-06 NOTE — Assessment & Plan Note (Signed)
Clinically euthyroid.  Normal TSH level 4 months ago. ?Recent visit with endocrinologist Dr. Everardo All reviewed. ?TSH repeated today. ?Continue Synthroid 50 mcg daily. ?

## 2022-03-06 NOTE — Assessment & Plan Note (Signed)
Stable.  Continue pantoprazole 40 mg as needed. ?

## 2022-03-06 NOTE — Progress Notes (Signed)
Kendra Rodriguez ?56 y.o. ? ? ?Chief Complaint  ?Patient presents with  ?? Dizziness  ?  Happening more days   ?? knot on her upper thigh   ?  Painful   ? ? ?HISTORY OF PRESENT ILLNESS: ?This is a 56 y.o. female here for follow-up of chronic medical problems: ?1.  Hypothyroidism on Synthroid 50 mcg daily ?2.  Prediabetes: On metformin, however too many GI side effects.  Advised to stop it ?3.  Dyslipidemia: On atorvastatin 40 mg daily ?4.  History of migraines ?5.  History of GERD ?Getting occasional short-lived episodes of vertigo triggered by head movement.  Episodes last several seconds without syncope or any other associated symptoms. ?Also has history of multiple sebaceous cysts with history of removal in the past.  Needs dermatology referral. ?Sebaceous cyst on right thigh getting painful. ?No other complaints or medical concerns today. ?BP Readings from Last 3 Encounters:  ?11/29/21 130/64  ?11/27/21 128/75  ?09/03/21 120/80  ? ?Wt Readings from Last 3 Encounters:  ?11/29/21 192 lb 3.2 oz (87.2 kg)  ?11/27/21 188 lb (85.3 kg)  ?09/03/21 188 lb (85.3 kg)  ? ? ? ?Dizziness ?Associated symptoms include headaches. Pertinent negatives include no abdominal pain, chest pain, chills, congestion, coughing, fever, nausea, rash, sore throat or vomiting.  ? ? ?Prior to Admission medications   ?Medication Sig Start Date End Date Taking? Authorizing Provider  ?acetaminophen (TYLENOL) 500 MG tablet Take 1,000 mg by mouth every 6 (six) hours as needed for headache.    [provider]  ?atorvastatin (LIPITOR) 40 MG tablet TAKE 1 TABLET(40 MG) BY MOUTH DAILY 02/12/22   [name], MD  ?cetirizine (ZYRTEC ALLERGY) 10 MG tablet Take 1 tablet (10 mg total) by mouth daily. ?Patient taking differently: Take 10 mg by mouth daily as needed for allergies. 05/11/21   [name], PA-C  ?cholecalciferol (VITAMIN D3) 25 MCG (1000 UNIT) tablet Take 1,000 Units by mouth daily.    [provider]  ?diazepam (VALIUM) 5  MG tablet Take 1 hour prior to MRI, may repeat x 1 09/03/21   Burns, Bobette Mo, MD  ?diclofenac Sodium (VOLTAREN) 1 % GEL Apply 2 g topically 4 (four) times daily. ?Patient taking differently: Apply 2 g topically 4 (four) times daily as needed (pain). 06/13/21   Kerrin Champagne, MD  ?famotidine (PEPCID) 40 MG tablet Take 40 mg by mouth 2 (two) times daily.    [provider]  ?fluticasone (FLONASE) 50 MCG/ACT nasal spray Place 2 sprays into both nostrils daily. ?Patient taking differently: Place 2 sprays into both nostrils daily as needed for allergies. 05/03/21   Rushie Chestnut, PA-C  ?levothyroxine (SYNTHROID) 50 MCG tablet Take 1 tablet (50 mcg total) by mouth daily. 03/29/21   Romero Belling, MD  ?metFORMIN (GLUCOPHAGE-XR) 500 MG 24 hr tablet Take 3 tablets (1,500 mg total) by mouth daily. 05/28/21   Romero Belling, MD  ?ondansetron (ZOFRAN) 4 MG tablet Take 1 tablet (4 mg total) by mouth every 8 (eight) hours as needed (for nausea associated with migraines). 09/03/21   Pincus Sanes, MD  ?pantoprazole (PROTONIX) 40 MG tablet Take 40 mg by mouth daily before breakfast.    [provider]  ?sucralfate (CARAFATE) 1 g tablet Take 1 g by mouth 3 (three) times daily. 08/02/21   [provider]  ?Benay Spice 5 % SOLN Place 1 drop into both eyes 2 (two) times daily. 03/23/20   [provider]  ?zolmitriptan (ZOMIG) 5 MG  tablet Take 1 tab prn for migraines, may repeat x 1 after 2 hours if needed.  MDD 2 09/03/21   Pincus SanesBurns, Stacy J, MD  ? ? ?No Known Allergies ? ?Patient Active Problem List  ? Diagnosis Date Noted  ?? Hypothyroidism 11/29/2021  ?? Post-COVID syndrome 06/04/2021  ?? Body mass index (BMI) of 35.0-35.9 in adult 06/04/2020  ?? History of migraine headaches 12/06/2019  ?? Dyslipidemia 06/30/2019  ?? GERD with esophagitis 06/30/2019  ?? Prediabetes 10/17/2015  ?? Spinal stenosis, lumbar region, with neurogenic claudication 10/24/2014  ? ? ?Past Medical History:  ?Diagnosis Date  ?? Acute  sinusitis   ?? Allergy   ?? Anxiety   ?? Depression   ? NO MEDICATIONS - DOING OK NOW  ?? Dizziness   ?? Ear ache   ? RIGHT --HX OF MULTIPLE EAR INFECTIONS AS A CHILD   ?? GERD (gastroesophageal reflux disease)   ?? Headache(784.0)   ? MIGRAINES  ?? Leg pain, left   ? with swelling  ?? Lumbar stenosis   ? PAIN BACK AND LEFT LEG AND NUMBNESS LEG AND FOOT  ?? Peripheral vascular disease (HCC)   ? HX OF TREATMENT FOR VARICOSE VEINS RIGHT LEG  ?? Thyroid disease   ? TOLD BLOOD LEVELS SLIGHTLY ABNORMAL - BUT NOT REQUIRED MEDICATION  ? ? ?Past Surgical History:  ?Procedure Laterality Date  ?? ABDOMINAL HYSTERECTOMY    ?? BACK SURGERY    ?? bladder lift    ? AT SAME TIME OF HYSTERECTOMY  ?? BRAVO PH STUDY N/A 07/26/2021  ? Procedure: BRAVO PH STUDY;  Surgeon: Jeani HawkingHung, Patrick, MD;  Location: [facility];  Service: Endoscopy;  Laterality: N/A;  ?? CHOLECYSTECTOMY    ?? ESOPHAGOGASTRODUODENOSCOPY (EGD) WITH PROPOFOL N/A 07/26/2021  ? Procedure: ESOPHAGOGASTRODUODENOSCOPY (EGD) WITH PROPOFOL;  Surgeon: Jeani HawkingHung, Patrick, MD;  Location: [facility];  Service: Endoscopy;  Laterality: N/A;  ?? LUMBAR LAMINECTOMY/DECOMPRESSION MICRODISCECTOMY Left 10/24/2014  ? Procedure: COMPLETE LUMBAR DECOMPRESSION L4-L5 CENTRAL AND MICRODISCECTOMY L4-L5 LEFT;  Surgeon: Jacki Conesonald A Gioffre, MD;  Location: [facility];  Service: Orthopedics;  Laterality: Left;  ?? POLYPECTOMY  07/26/2021  ? Procedure: POLYPECTOMY;  Surgeon: Jeani HawkingHung, Patrick, MD;  [facility];  Service: Endoscopy;;  ? ? ?Social History  ? ?Socioeconomic History  ?? Marital status: Married  ?  Spouse name: Not on file  ?? Number of children: Not on file  ?? Years of education: Not on file  ?? Highest education level: Not on file  ?Occupational History  ?? Not on file  ?Tobacco Use  ?? Smoking status: Never  ?? Smokeless tobacco: Never  ?Vaping Use  ?? Vaping Use: Never used  ?Substance and Sexual Activity  ?? Alcohol use: No  ?? Drug use: No  ?? Sexual activity: Not on file  ?Other  Topics Concern  ?? Not on file  ?Social History Narrative  ? ** Merged History Encounter **  ?    ? ?Social Determinants of Health  ? ?Financial Resource Strain: Not on file  ?Food Insecurity: Not on file  ?Transportation Needs: Not on file  ?Physical Activity: Not on file  ?Stress: Not on file  ?Social Connections: Not on file  ?Intimate Partner Violence: Not on file  ? ? ?Family History  ?Problem Relation Age of Onset  ?? Hypertension Mother   ?? Arthritis Mother   ?? Diabetes Mother   ?? Hyperlipidemia Mother   ?? Arthritis Sister   ?? Rheum arthritis Brother   ?? Autoimmune disease Brother   ??  Lung disease Brother   ?? Arthritis Sister   ?? Gout Son   ?? High Cholesterol Daughter   ?? Thyroid disease Neg Hx   ? ? ? ?Review of Systems  ?Constitutional: Negative.  Negative for chills and fever.  ?HENT: Negative.  Negative for congestion and sore throat.   ?Eyes: Negative.   ?Respiratory: Negative.  Negative for cough and shortness of breath.   ?Cardiovascular: Negative.  Negative for chest pain and palpitations.  ?Gastrointestinal:  Negative for abdominal pain, diarrhea, nausea and vomiting.  ?Genitourinary: Negative.   ?Musculoskeletal:  Positive for back pain.  ?Skin: Negative.  Negative for rash.  ?Neurological:  Positive for dizziness and headaches. Negative for speech change, focal weakness, seizures and loss of consciousness.  ?All other systems reviewed and are negative. ?Today's Vitals  ? 03/06/22 0805  ?BP: 140/78  ?Pulse: 68  ?Temp: 97.9 ?F (36.6 ?C)  ?TempSrc: Oral  ?SpO2: 99%  ?Weight: 188 lb 6 oz (85.4 kg)  ?Height: 5\' 3"  (1.6 m)  ? ?Body mass index is 33.37 kg/m?. ? ? ?Physical Exam ?Vitals reviewed.  ?Constitutional:   ?   Appearance: Normal appearance.  ?HENT:  ?   Head: Normocephalic.  ?Eyes:  ?   Extraocular Movements: Extraocular movements intact.  ?   Pupils: Pupils are equal, round, and reactive to light.  ?Cardiovascular:  ?   Rate and Rhythm: Normal rate and regular rhythm.  ?   Pulses:  Normal pulses.  ?   Heart sounds: Normal heart sounds.  ?Pulmonary:  ?   Effort: Pulmonary effort is normal.  ?   Breath sounds: Normal breath sounds.  ?Musculoskeletal:     ?   General: Normal range of motion.

## 2022-03-06 NOTE — Assessment & Plan Note (Signed)
Stable.  Diet and nutrition discussed. ?Metformin causing GI side effects affecting quality of life. ?Hemoglobin A1c done today. ?Stop metformin. ?Follow-up in 6 months. ?

## 2022-03-06 NOTE — Assessment & Plan Note (Signed)
Stable.  No red flag signs or symptoms.  Unremarkable brain MRI done on 10/01/2019. ?Takes medications as needed. ?

## 2022-03-12 ENCOUNTER — Telehealth: Payer: Self-pay | Admitting: Dermatology

## 2022-03-12 NOTE — Telephone Encounter (Signed)
Please ask Dr Latrelle Dodrill office to try to get her in elsewhere before January. Thanks. ?

## 2022-03-12 NOTE — Telephone Encounter (Signed)
Notes documented and referral routed back to referring office. 

## 2022-03-21 ENCOUNTER — Ambulatory Visit: Payer: Managed Care, Other (non HMO) | Admitting: Endocrinology

## 2022-03-22 ENCOUNTER — Other Ambulatory Visit: Payer: Self-pay | Admitting: Endocrinology

## 2022-03-28 ENCOUNTER — Telehealth: Payer: Self-pay

## 2022-03-28 NOTE — Telephone Encounter (Signed)
Pt daughter is requesting to have Dermatology referral sent to another location as there soonest appt is in January per referral note.  Patient returned my call, front staff offered next available new patient appointment in January. She does not want to wait that long to be seen and asked that referral be routed back to referring office so they can find someone to see her sooner.  Please advise

## 2022-03-28 NOTE — Telephone Encounter (Signed)
I contacted the pt daughter to update her on the referral and that they would receive a call from them.

## 2022-04-17 ENCOUNTER — Encounter (HOSPITAL_COMMUNITY): Payer: Self-pay

## 2022-04-17 ENCOUNTER — Ambulatory Visit (HOSPITAL_COMMUNITY)
Admission: RE | Admit: 2022-04-17 | Discharge: 2022-04-17 | Disposition: A | Discharge status: Home / Self Care | Payer: Commercial Managed Care - HMO | Source: Ambulatory Visit | Attending: Internal Medicine | Admitting: Internal Medicine

## 2022-04-17 VITALS — BP 151/84 | HR 61 | Temp 98.4°F | Resp 18

## 2022-04-17 DIAGNOSIS — H1010 Acute atopic conjunctivitis, unspecified eye: Secondary | ICD-10-CM

## 2022-04-17 DIAGNOSIS — J309 Allergic rhinitis, unspecified: Secondary | ICD-10-CM

## 2022-04-17 MED ORDER — FLUTICASONE PROPIONATE 50 MCG/ACT NA SUSP: 1.0000 | Freq: Every day | NASAL | 2 refills | Status: DC

## 2022-04-17 MED ORDER — OLOPATADINE HCL 0.1 % OP SOLN: 1.0000 [drp] | Freq: Two times a day (BID) | OPHTHALMIC | 12 refills | Status: DC

## 2022-04-17 MED ORDER — CETIRIZINE HCL 10 MG PO TABS: 10.0000 mg | ORAL_TABLET | Freq: Every day | ORAL | 1 refills | Status: DC

## 2022-04-17 NOTE — ED Triage Notes (Signed)
Pt c/o nasal congestion, bilateral eye itchy/watery, sinus pressure, and head pressure x4 days. Took OTC with no relief.

## 2022-04-17 NOTE — Discharge Instructions (Addendum)
Increase oral fluid intake Take medications as prescribed Please return to urgent care if symptoms worsen Humidifier and VapoRub use will help with nasal congestion

## 2022-04-18 NOTE — ED Provider Notes (Signed)
[place]    CSN: [id number] Arrival date & time: 04/17/22  1447      History   Chief Complaint Chief Complaint  Patient presents with   Nasal Congestion    drainage, headache - Entered by patient    HPI Kendra Rodriguez is a 56 y.o. female comes to the urgent care with 4-day history of nasal congestion bilateral itchy watery eyes and headaches.  Patient says symptoms started insidiously and has been persistent.  She has tried over-the-counter medications with no improvement.  She denies postnasal drainage but endorses occasional cough without chest tightness..  No fever or chills.  No body aches.  No sick contacts.  No toxic inhalational exposures.  Patient denies smoking.   HPI  Past Medical History:  Diagnosis Date   Acute sinusitis    Allergy    Anxiety    Depression    NO MEDICATIONS - DOING OK NOW   Dizziness    Ear ache    RIGHT --HX OF MULTIPLE EAR INFECTIONS AS A CHILD    GERD (gastroesophageal reflux disease)    Headache(784.0)    MIGRAINES   Leg pain, left    with swelling   Lumbar stenosis    PAIN BACK AND LEFT LEG AND NUMBNESS LEG AND FOOT   Peripheral vascular disease (HCC)    HX OF TREATMENT FOR VARICOSE VEINS RIGHT LEG   Thyroid disease    TOLD BLOOD LEVELS SLIGHTLY ABNORMAL - BUT NOT REQUIRED MEDICATION    Patient Active Problem List   Diagnosis Date Noted   Hypothyroidism 11/29/2021   Post-COVID syndrome 06/04/2021   Body mass index (BMI) of 35.0-35.9 in adult 06/04/2020   History of migraine headaches 12/06/2019   Dyslipidemia 06/30/2019   GERD with esophagitis 06/30/2019   Chronic vertigo 04/13/2018   Prediabetes 10/17/2015   Spinal stenosis, lumbar region, with neurogenic claudication 10/24/2014    Past Surgical History:  Procedure Laterality Date   ABDOMINAL HYSTERECTOMY     BACK SURGERY     bladder lift     AT SAME TIME OF HYSTERECTOMY   BRAVO [place] STUDY N/A 07/26/2021   Procedure: BRAVO [place] STUDY;  Surgeon: Carol Ada,  MD;  Location: [facility];  Service: Endoscopy;  Laterality: N/A;   CHOLECYSTECTOMY     ESOPHAGOGASTRODUODENOSCOPY (EGD) WITH PROPOFOL N/A 07/26/2021   Procedure: ESOPHAGOGASTRODUODENOSCOPY (EGD) WITH PROPOFOL;  Surgeon: Carol Ada, MD;  Location: [facility];  Service: Endoscopy;  Laterality: N/A;   LUMBAR LAMINECTOMY/DECOMPRESSION MICRODISCECTOMY Left 10/24/2014   Procedure: COMPLETE LUMBAR DECOMPRESSION L4-L5 CENTRAL AND MICRODISCECTOMY L4-L5 LEFT;  Surgeon: Tobi Bastos, MD;  Location: [facility];  Service: Orthopedics;  Laterality: Left;   POLYPECTOMY  07/26/2021   Procedure: POLYPECTOMY;  Surgeon: Carol Ada, MD;  Location: [facility];  Service: Endoscopy;;    OB History   No obstetric history on file.      Home Medications    Prior to Admission medications   Medication Sig Start Date End Date Taking? Authorizing Provider  cetirizine (ZYRTEC ALLERGY) 10 MG tablet Take 1 tablet (10 mg total) by mouth daily. 04/17/22  Yes Dimitrious Micciche, Myrene Galas, MD  fluticasone (FLONASE) 50 MCG/ACT nasal spray Place 1 spray into both nostrils daily. 04/17/22  Yes Skarlett Sedlacek, Myrene Galas, MD  olopatadine (PATANOL) 0.1 % ophthalmic solution Place 1 drop into both eyes 2 (two) times daily. 04/17/22  Yes Jase Reep, Myrene Galas, MD  atorvastatin (LIPITOR) 40 MG tablet TAKE 1 TABLET(40 MG) BY MOUTH DAILY 02/12/22  [name], MD  levothyroxine (SYNTHROID) 50 MCG tablet TAKE 1 TABLET(50 MCG) BY MOUTH DAILY 03/24/22   [name], MD  metFORMIN (GLUCOPHAGE-XR) 500 MG 24 hr tablet Take 3 tablets (1,500 mg total) by mouth daily. 05/28/21   Romero Belling, MD  pantoprazole (PROTONIX) 40 MG tablet Take 40 mg by mouth daily before breakfast.    [provider]    Family History Family History  Problem Relation Age of Onset   Hypertension Mother    Arthritis Mother    Diabetes Mother    Hyperlipidemia Mother    Arthritis Sister    Rheum arthritis Brother    Autoimmune disease Brother    Lung  disease Brother    Arthritis Sister    Gout Son    High Cholesterol Daughter    Thyroid disease Neg Hx     Social History Social History   Tobacco Use   Smoking status: Never   Smokeless tobacco: Never  Vaping Use   Vaping Use: Never used  Substance Use Topics   Alcohol use: No   Drug use: No     Allergies   Patient has no known allergies.   Review of Systems Review of Systems  Constitutional: Negative.   HENT:  Positive for congestion and sinus pressure. Negative for ear discharge, ear pain, sinus pain and trouble swallowing.   Eyes:  Positive for discharge and itching. Negative for photophobia, pain, redness and visual disturbance.  Respiratory:  Positive for cough. Negative for chest tightness and shortness of breath.   Gastrointestinal: Negative.   Musculoskeletal: Negative.   Neurological:  Positive for headaches.     Physical Exam Triage Vital Signs ED Triage Vitals  Enc Vitals Group     BP 04/17/22 1507 (!) 151/84     Pulse Rate 04/17/22 1507 61     Resp 04/17/22 1507 18     Temp 04/17/22 1507 98.4 F (36.9 C)     Temp src --      SpO2 04/17/22 1507 98 %     Weight --      Height --      Head Circumference --      Peak Flow --      Pain Score 04/17/22 1508 6     Pain Loc --      Pain Edu? --      Excl. in GC? --    No data found.  Updated Vital Signs BP (!) 151/84 (BP Location: Left Arm)   Pulse 61   Temp 98.4 F (36.9 C)   Resp 18   SpO2 98%   Visual Acuity Right Eye Distance:   Left Eye Distance:   Bilateral Distance:    Right Eye Near:   Left Eye Near:    Bilateral Near:     Physical Exam   UC Treatments / Results  Labs (all labs ordered are listed, but only abnormal results are displayed) Labs Reviewed - No data to display  EKG   Radiology No results found.  Procedures Procedures (including critical care time)  Medications Ordered in UC Medications - No data to display  Initial Impression / Assessment and Plan  / UC Course  I have reviewed the triage vital signs and the nursing notes.  Pertinent labs & imaging results that were available during my care of the patient were reviewed by me and considered in my medical decision making (see chart for details).     1.  Allergic rhinoconjunctivitis:  Humidifier use and vapor rub use to help with nasal congestion Fluticasone nasal spray Patanol eyedrops Zyrtec 10 mg orally daily Exam is unremarkable Return to urgent care if symptoms worsen Final Clinical Impressions(s) / UC Diagnoses   Final diagnoses:  Allergic rhinoconjunctivitis     Discharge Instructions      Increase oral fluid intake Take medications as prescribed Please return to urgent care if symptoms worsen Humidifier and VapoRub use will help with nasal congestion   ED Prescriptions     Medication Sig Dispense Auth. Provider   cetirizine (ZYRTEC ALLERGY) 10 MG tablet Take 1 tablet (10 mg total) by mouth daily. 30 tablet Olivene Cookston, Britta Mccreedy, MD   fluticasone (FLONASE) 50 MCG/ACT nasal spray Place 1 spray into both nostrils daily. 16 g Ugonna Keirsey, Britta Mccreedy, MD   olopatadine (PATANOL) 0.1 % ophthalmic solution Place 1 drop into both eyes 2 (two) times daily. 5 mL Brendin Situ, Britta Mccreedy, MD      PDMP not reviewed this encounter.   Merrilee Jansky, MD 04/18/22 [phone]

## 2022-04-21 NOTE — Progress Notes (Unsigned)
[facility] New Patient Evaluation and Consultation  Referring Provider: Everett Graff, MD PCP: Horald Pollen, MD Date of Service: 04/22/2022  SUBJECTIVE Chief Complaint: No chief complaint on file.  History of Present Illness: Kendra Rodriguez is a 56 y.o. {ED SANE [phone] female seen in consultation at the request of Dr. Mancel Bale for evaluation of ***.    ***Review of records significant for: Has history of TVH, TVT and A/P repair. Currently on Myrbetriq for overactive bladder.   Urinary Symptoms: {urine leakage?:24754} Leaks *** time(s) per {days/wks/mos/yrs:310907}.  Pad use: {NUMBERS 1-10:18281} {pad option:24752} per day.   She {ACTION; IS/IS [id number] bothered by her UI symptoms.  Day time voids ***.  Nocturia: *** times per night to void. Voiding dysfunction: she {empties:24755} her bladder well.  {DOES NOT does:27190::"does not"} use a catheter to empty bladder.  When urinating, she feels {urine symptoms:24756} Drinks: *** per day  UTIs: {NUMBERS 1-10:18281} UTI's in the last year.   {ACTIONS;DENIES/REPORTS:21021675::"Denies"} history of {urologic concerns:24757}  Pelvic Organ Prolapse Symptoms:                  She {denies/ admits to:24761} a feeling of a bulge the vaginal area. It has been present for {NUMBER 1-10:22536} {days/wks/mos/yrs:310907}.  She {denies/ admits to:24761} seeing a bulge.  This bulge {ACTION; IS/IS [id number] bothersome.  Bowel Symptom: Bowel movements: *** time(s) per {Time; day/week/month:13537} Stool consistency: {stool consistency:24758} Straining: {yes/no:19897}.  Splinting: {yes/no:19897}.  Incomplete evacuation: {yes/no:19897}.  She {denies/ admits to:24761} accidental bowel leakage / fecal incontinence  Occurs: *** time(s) per {Time; day/week/month:13537}  Consistency with leakage: {stool consistency:24758} Bowel regimen: {bowel regimen:24759} Last colonoscopy: Date ***, Results ***  Sexual  Function Sexually active: {yes/no:19897}.  Sexual orientation: {Sexual [phone]} Pain with sex: {pain with sex:24762}  Pelvic Pain {denies/ admits to:24761} pelvic pain Location: *** Pain occurs: *** Prior pain treatment: *** Improved by: *** Worsened by: ***   Past Medical History:  Past Medical History:  Diagnosis Date   Acute sinusitis    Allergy    Anxiety    Depression    NO MEDICATIONS - DOING OK NOW   Dizziness    Ear ache    RIGHT --HX OF MULTIPLE EAR INFECTIONS AS A CHILD    GERD (gastroesophageal reflux disease)    Headache(784.0)    MIGRAINES   Leg pain, left    with swelling   Lumbar stenosis    PAIN BACK AND LEFT LEG AND NUMBNESS LEG AND FOOT   Peripheral vascular disease (HCC)    HX OF TREATMENT FOR VARICOSE VEINS RIGHT LEG   Thyroid disease    TOLD BLOOD LEVELS SLIGHTLY ABNORMAL - BUT NOT REQUIRED MEDICATION     Past Surgical History:   Past Surgical History:  Procedure Laterality Date   ABDOMINAL HYSTERECTOMY     BACK SURGERY     bladder lift     AT SAME TIME OF HYSTERECTOMY   BRAVO [place] STUDY N/A 07/26/2021   Procedure: BRAVO [place] STUDY;  Surgeon: Carol Ada, MD;  Location: [facility];  Service: Endoscopy;  Laterality: N/A;   CHOLECYSTECTOMY     ESOPHAGOGASTRODUODENOSCOPY (EGD) WITH PROPOFOL N/A 07/26/2021   Procedure: ESOPHAGOGASTRODUODENOSCOPY (EGD) WITH PROPOFOL;  Surgeon: Carol Ada, MD;  Location: [facility];  Service: Endoscopy;  Laterality: N/A;   LUMBAR LAMINECTOMY/DECOMPRESSION MICRODISCECTOMY Left 10/24/2014   Procedure: COMPLETE LUMBAR DECOMPRESSION L4-L5 CENTRAL AND MICRODISCECTOMY L4-L5 LEFT;  Surgeon: Tobi Bastos, MD;  Location: [facility];  Service: Orthopedics;  Laterality: Left;  POLYPECTOMY  07/26/2021   Procedure: POLYPECTOMY;  Surgeon: Jeani Hawking, MD;  Location: [facility];  Service: Endoscopy;;     Past OB/GYN History: G{NUMBERS 1-10:18281} P{NUMBERS 0-16:01093} Vaginal deliveries: ***,  Forceps/  Vacuum deliveries: ***, Cesarean section: *** Menopausal: {menopausal:24763} Contraception: ***. Last pap smear was ***.  Any history of abnormal pap smears: {yes/no:19897}.   Medications: She has a current medication list which includes the following prescription(s): atorvastatin, cetirizine, fluticasone, levothyroxine, metformin, olopatadine, and pantoprazole.   Allergies: Patient has No Known Allergies.   Social History:  Social History   Tobacco Use   Smoking status: Never   Smokeless tobacco: Never  Vaping Use   Vaping Use: Never used  Substance Use Topics   Alcohol use: No   Drug use: No    Relationship status: {relationship status:24764} She lives with ***.   She {ACTION; IS/IS ATF:57322025} employed ***. Regular exercise: {Yes/No:304960894} History of abuse: {Yes/No:304960894}  Family History:   Family History  Problem Relation Age of Onset   Hypertension Mother    Arthritis Mother    Diabetes Mother    Hyperlipidemia Mother    Arthritis Sister    Rheum arthritis Brother    Autoimmune disease Brother    Lung disease Brother    Arthritis Sister    Gout Son    High Cholesterol Daughter    Thyroid disease Neg Hx      Review of Systems: ROS   OBJECTIVE Physical Exam: There were no vitals filed for this visit.  Physical Exam   GU / Detailed Urogynecologic Evaluation:  Pelvic Exam: Normal external female genitalia; Bartholin's and Skene's glands normal in appearance; urethral meatus normal in appearance, no urethral masses or discharge.   CST: {gen negative/positive:315881}  Reflexes: bulbocavernosis {DESC; PRESENT/NOT PRESENT:21021351}, anocutaneous {DESC; PRESENT/NOT PRESENT:21021351} ***bilaterally.  Speculum exam reveals normal vaginal mucosa {With/Without:20273} atrophy. Cervix {exam; gyn cervix:30847}. Uterus {exam; pelvic uterus:30849}. Adnexa {exam; adnexa:12223}.    s/p hysterectomy: Speculum exam reveals normal vaginal mucosa  {With/Without:20273}  atrophy and normal vaginal cuff.  Adnexa {exam; adnexa:12223}.    With apex supported, anterior compartment defect was {reduced:24765}  Pelvic floor strength {Roman # I-V:19040}/V, puborectalis {Roman # I-V:19040}/V external anal sphincter {Roman # I-V:19040}/V  Pelvic floor musculature: Right levator {Tender/Non-tender:20250}, Right obturator {Tender/Non-tender:20250}, Left levator {Tender/Non-tender:20250}, Left obturator {Tender/Non-tender:20250}  POP-Q:   POP-Q                                               Aa                                               Ba                                                 C                                                Gh  Pb                                               tvl                                                Ap                                               Bp                                                 D     Rectal Exam:  Normal sphincter tone, {rectocele:24766} distal rectocele, enterocoele {DESC; PRESENT/NOT PRESENT:21021351}, no rectal masses, {sign of:24767} dyssynergia when asking the patient to bear down.  Post-Void Residual (PVR) by Bladder Scan: In order to evaluate bladder emptying, we discussed obtaining a postvoid residual and she agreed to this procedure.  Procedure: The ultrasound unit was placed on the patient's abdomen in the suprapubic region after the patient had voided. A PVR of *** ml was obtained by bladder scan.  Laboratory Results: @ENCLABS @   ***I visualized the urine specimen, noting the specimen to be {urine color:24768}  ASSESSMENT AND PLAN Ms. Rathgeber is a 56 y.o. with: No diagnosis found.    59, MD   Medical Decision Making:  - Reviewed/ ordered a clinical laboratory test - Reviewed/ ordered a radiologic study - Reviewed/ ordered medicine test - Decision to obtain old records - Discussion of  management of or test interpretation with an external physician / other healthcare professional  - Assessment requiring independent historian - Review and summation of prior records - Independent review of image, tracing or specimen

## 2022-04-22 ENCOUNTER — Ambulatory Visit: Payer: Commercial Managed Care - HMO | Admitting: Obstetrics and Gynecology

## 2022-04-22 ENCOUNTER — Encounter: Payer: Self-pay | Admitting: Obstetrics and Gynecology

## 2022-04-22 VITALS — BP 130/84 | HR 68 | Ht 61.5 in | Wt 188.0 lb

## 2022-04-22 DIAGNOSIS — R350 Frequency of micturition: Secondary | ICD-10-CM

## 2022-04-22 DIAGNOSIS — N3941 Urge incontinence: Secondary | ICD-10-CM | POA: Diagnosis not present

## 2022-04-22 DIAGNOSIS — N8110 Cystocele, unspecified: Secondary | ICD-10-CM

## 2022-04-22 DIAGNOSIS — M62838 Other muscle spasm: Secondary | ICD-10-CM

## 2022-04-22 DIAGNOSIS — N816 Rectocele: Secondary | ICD-10-CM

## 2022-04-22 LAB — POCT URINALYSIS DIPSTICK
Appearance: NORMAL
Bilirubin, UA: NEGATIVE
Glucose, UA: NEGATIVE
Ketones, UA: NEGATIVE
Leukocytes, UA: NEGATIVE
Nitrite, UA: NEGATIVE
Protein, UA: NEGATIVE
Spec Grav, UA: <=1.005 — AB (ref 1.010–1.025)
Urobilinogen, UA: 0.2 E.U./dL
pH, UA: 6 (ref 5.0–8.0)

## 2022-04-22 MED ORDER — DIAZEPAM 5 MG PO TABS: ORAL_TABLET | ORAL | 0 refills | Status: DC

## 2022-05-21 ENCOUNTER — Other Ambulatory Visit: Payer: Self-pay

## 2022-05-21 ENCOUNTER — Encounter: Payer: Self-pay | Admitting: Physical Therapy

## 2022-05-21 ENCOUNTER — Ambulatory Visit: Payer: Commercial Managed Care - HMO | Attending: Obstetrics and Gynecology | Admitting: Physical Therapy

## 2022-05-21 DIAGNOSIS — M6281 Muscle weakness (generalized): Secondary | ICD-10-CM | POA: Insufficient documentation

## 2022-05-21 DIAGNOSIS — N3941 Urge incontinence: Secondary | ICD-10-CM | POA: Insufficient documentation

## 2022-05-21 DIAGNOSIS — M62838 Other muscle spasm: Secondary | ICD-10-CM | POA: Diagnosis present

## 2022-05-21 DIAGNOSIS — R293 Abnormal posture: Secondary | ICD-10-CM

## 2022-05-21 DIAGNOSIS — R279 Unspecified lack of coordination: Secondary | ICD-10-CM | POA: Insufficient documentation

## 2022-05-21 NOTE — Patient Instructions (Signed)

## 2022-05-21 NOTE — Therapy (Signed)
OUTPATIENT PHYSICAL THERAPY FEMALE PELVIC EVALUATION   Patient Name: Kendra Rodriguez MRN: [id number] DOB:10-12-1966, 56 y.o., female Today's Date: 05/21/2022   PT End of Session - 05/21/22 0930     Visit Number 1    Date for PT Re-Evaluation 08/21/22    Authorization Type cigna    PT Start Time 0930    PT Stop Time 1012    PT Time Calculation (min) 42 min    Activity Tolerance Patient tolerated treatment well    Behavior During Therapy WFL for tasks assessed/performed             Past Medical History:  Diagnosis Date   Acute sinusitis    Allergy    Anxiety    Depression    NO MEDICATIONS - DOING OK NOW   Dizziness    Ear ache    RIGHT --HX OF MULTIPLE EAR INFECTIONS AS A CHILD    GERD (gastroesophageal reflux disease)    Headache(784.0)    MIGRAINES   Leg pain, left    with swelling   Lumbar stenosis    PAIN BACK AND LEFT LEG AND NUMBNESS LEG AND FOOT   Peripheral vascular disease (HCC)    HX OF TREATMENT FOR VARICOSE VEINS RIGHT LEG   Thyroid disease    TOLD BLOOD LEVELS SLIGHTLY ABNORMAL - BUT NOT REQUIRED MEDICATION   Past Surgical History:  Procedure Laterality Date   BACK SURGERY     bladder lift     AT SAME TIME OF HYSTERECTOMY   BRAVO PH STUDY N/A 07/26/2021   Procedure: BRAVO PH STUDY;  Surgeon: Jeani Hawking, MD;  Location: [facility];  Service: Endoscopy;  Laterality: N/A;   CHOLECYSTECTOMY     ESOPHAGOGASTRODUODENOSCOPY (EGD) WITH PROPOFOL N/A 07/26/2021   Procedure: ESOPHAGOGASTRODUODENOSCOPY (EGD) WITH PROPOFOL;  Surgeon: Jeani Hawking, MD;  Location: [facility];  Service: Endoscopy;  Laterality: N/A;   LUMBAR LAMINECTOMY/DECOMPRESSION MICRODISCECTOMY Left 10/24/2014   Procedure: COMPLETE LUMBAR DECOMPRESSION L4-L5 CENTRAL AND MICRODISCECTOMY L4-L5 LEFT;  Surgeon: Jacki Cones, MD;  Location: [facility];  Service: Orthopedics;  Laterality: Left;   POLYPECTOMY  07/26/2021   Procedure: POLYPECTOMY;  Surgeon: Jeani Hawking, MD;  Location: [facility];  Service: Endoscopy;;   VAGINAL HYSTERECTOMY     Patient Active Problem List   Diagnosis Date Noted   Hypothyroidism 11/29/2021   Post-COVID syndrome 06/04/2021   Body mass index (BMI) of 35.0-35.9 in adult 06/04/2020   History of migraine headaches 12/06/2019   Dyslipidemia 06/30/2019   GERD with esophagitis 06/30/2019   Chronic vertigo 04/13/2018   Prediabetes 10/17/2015   Spinal stenosis, lumbar region, with neurogenic claudication 10/24/2014    PCP: [name], MD  REFERRING PROVIDER: Marguerita Beards, MD  REFERRING DIAG: [phone] (ICD-10-CM) - Levator spasm N39.41 (ICD-10-CM) - Urge incontinence  THERAPY DIAG:  Muscle weakness (generalized)  Unspecified lack of coordination  Other muscle spasm  Abnormal posture  Rationale for Evaluation and Treatment Rehabilitation  ONSET DATE: 4 months  SUBJECTIVE:  SUBJECTIVE STATEMENT: Pt reports she is having pelvic pain, pressure at pelvic region and does have urine leakage after finishing bathroom and then has leakage in standing.   Fluid intake: Yes: 48oz of water per day, sometimes coffee but not daily     PAIN:  Are you having pain? Yes NPRS scale: 6-7/10 can be as high as 9/10 Pain location: abdominal region lower and bil  Pain type: aching and burning Pain description: intermittent   Aggravating factors: household tasks, bending over, prolonged sitting Relieving factors: resting at end of the day  PRECAUTIONS: None  WEIGHT BEARING RESTRICTIONS No  FALLS:  Has patient fallen in last 6 months? No  LIVING ENVIRONMENT: Lives with: lives with their family Lives in: House/apartment   OCCUPATION: not working  PLOF: Independent  PATIENT GOALS to have less pain  PERTINENT HISTORY:  TVH, TVT and  A/P repair. Currently on Myrbetriq for overactive bladder.  Sexual abuse: No  BOWEL MOVEMENT Pain with bowel movement: No Type of bowel movement:Type (Bristol Stool Scale) 6-4, Frequency daily3-4x , and Strain No Fully empty rectum: Yes:   Leakage: No Pads: No Fiber supplement: No  URINATION Pain with urination: No Fully empty bladder: Yes:   Stream: Strong Urgency: Yes:   Frequency: depends on water intake, with a lot of water it can be every hour but if not every 2 hours Leakage:  standing from bathroom after urinary void Pads: No  INTERCOURSE Pain with intercourse: Initial Penetration, During Penetration, and After Intercourse Ability to have vaginal penetration:  Yes: but painful   PREGNANCY Vaginal deliveries 5 Tearing Yes: all 5 had tearing C-section deliveries 0 Currently pregnant No  PROLAPSE Sometimes pressure feeling    OBJECTIVE:   DIAGNOSTIC FINDINGS:    COGNITION:  Overall cognitive status: Within functional limits for tasks assessed     SENSATION:  Light touch: Appears intact  Proprioception: Appears intact  MUSCLE LENGTH: Bil hamstrings and adductors limited by 25%  LUMBAR SPECIAL TESTS:  SI Compression/distraction test: painful with compression and distraction and FABER test: painful bil                   POSTURE: rounded shoulders and forward head   LUMBARAROM/PROM  A/PROM A/PROM  eval  Flexion Limited by 50%  Extension Limited by 25%  Right lateral flexion Limited by 50%  Left lateral flexion Limited by 50%  Right rotation Limited by 25%  Left rotation Limited by 25%   (Blank rows = not tested)  LOWER EXTREMITY ROM:   Bil WFL  LOWER EXTREMITY MMT:  Bil hip abduction 4/5 pain at Rt side; all other hip 4/5 without pain; knees and ankles 5/5   PALPATION:   General  TTP at bil anterior lateral pelvic region and bil pubic bone; mild fascial restrictions at lower abdomen quadrants                 External Perineal Exam TTP  at lower rt bulbocavernosus                             Internal Pelvic Floor TTP at bil superficial and deep layers   Patient confirms identification and approves PT to assess internal pelvic floor and treatment Yes  PELVIC MMT:   MMT eval  Vaginal 3/5; 3s; 4 reps  Internal Anal Sphincter   External Anal Sphincter   Puborectalis   Diastasis Recti   (Blank rows = not tested)  TONE: Slightly increased   PROLAPSE:  Anterior and posterior wall laxity noted in hooklying with strong cough possible grade 1-2 based on tissue descent  TODAY'S TREATMENT  EVAL Examination completed, findings reviewed, pt educated on POC, HEP, and lubricants and moisturizers. Pt motivated to participate in PT and agreeable to attempt recommendations.     PATIENT EDUCATION:  Education details: [id number] Person educated: Patient Education method: Explanation, Demonstration, Tactile cues, Verbal cues, and Handouts Education comprehension: verbalized understanding and returned demonstration   HOME EXERCISE PROGRAM: [id number]  ASSESSMENT:  CLINICAL IMPRESSION: Patient is a 56 y.o. female  who was seen today for physical therapy evaluation and treatment for pelvic pain and urinary incontinence intermittently. Pt found to have decreased spinal and hip flexibility, decreased hip strength and core strength, TTP at lower abdominal/pelvic region at Rt and Lt side and TTP at bil pubic bone. Pt consented to internal vaginal assessment this date and found to have decreased strength, coordination, and endurance but also had increased tension throughout pelvic floor with TTP throughout both superficial and deep layers which may be leading to testing with weaker strengthens. Pt tolerated well did also have noted dryness externally at vuvla, reports she feels dry internally, and did have laxity at posterior and anterior walls vaginally. Pt would benefit from additional PT to further address deficits.     OBJECTIVE  IMPAIRMENTS decreased coordination, decreased endurance, increased fascial restrictions, increased muscle spasms, impaired flexibility, improper body mechanics, postural dysfunction, and pain.   ACTIVITY LIMITATIONS lifting, bending, stairs, continence, and locomotion level  PARTICIPATION LIMITATIONS: meal prep, cleaning, shopping, community activity, and yard work  PERSONAL FACTORS Age, Time since onset of injury/illness/exacerbation, and 1 comorbidity: 5 vaginal births with tearing  are also affecting patient's functional outcome.   REHAB POTENTIAL: Good  CLINICAL DECISION MAKING: Stable/uncomplicated  EVALUATION COMPLEXITY: Low   GOALS: Goals reviewed with patient? Yes  SHORT TERM GOALS: Target date: 06/18/2022  Pt to be I with HEP.  Baseline: Goal status: INITIAL  2.  Pt to report no more than one urinary leakage instance per week for improved QOL.  Baseline:  Goal status: INITIAL  3.  Pt will report 25% reduction of pain due to improvements in posture, strength, and muscle length  Baseline: 7/10 Goal status: INITIAL  4.  Pt to demonstrate improved ability to contract/relax/bulge/relax pelvic floor with minimal cues for improved mobility of tissue and decreased pain. Baseline:  Goal status: INITIAL    LONG TERM GOALS: Target date:  08/21/22    Pt to be I with advanced HEP.  Baseline:  Goal status: INITIAL  2.  Pt to demonstrated improved coordination of pelvic floor and breathing mechanics for decreased pain and strain at pelvic floor at least 75%of the time.  Baseline:  Goal status: INITIAL  3.  Pt to be I with voiding mechanics and breathing mechanics to decrease strain at pelvic floor.  Baseline:  Goal status: INITIAL  4.  Pt to demonstrate at least 4/5 pelvic floor strength and fully relax post contraction for improved pelvic stability and decreased strain at pelvic floor/ decrease leakage.  Baseline:  Goal status: INITIAL    PLAN: PT FREQUENCY:  1x/week  PT DURATION:  8 sessions  PLANNED INTERVENTIONS: Therapeutic exercises, Therapeutic activity, Neuromuscular re-education, Patient/Family education, Self Care, Joint mobilization, Aquatic Therapy, Dry Needling, Spinal mobilization, Cryotherapy, Moist heat, scar mobilization, Taping, Biofeedback, and Manual therapy  PLAN FOR NEXT SESSION: go over handouts as needed, manual internal if needed, manual/stretching at hips/core/back/abdomen  Stacy Gardner, PT, DPT 05/21/2310:14 AM

## 2022-05-29 ENCOUNTER — Ambulatory Visit: Payer: Commercial Managed Care - HMO | Admitting: Physical Therapy

## 2022-05-29 DIAGNOSIS — M6281 Muscle weakness (generalized): Secondary | ICD-10-CM

## 2022-05-29 DIAGNOSIS — M62838 Other muscle spasm: Secondary | ICD-10-CM

## 2022-05-29 DIAGNOSIS — R279 Unspecified lack of coordination: Secondary | ICD-10-CM

## 2022-05-29 NOTE — Patient Instructions (Signed)
   Balloon breathing: when you feel like you need to strain to have a bowel movement make a small open fist and blow through it like you are blowing up a balloon. Do this instead of straining/pushing.

## 2022-05-29 NOTE — Therapy (Signed)
OUTPATIENT PHYSICAL THERAPY FEMALE PELVIC EVALUATION   Patient Name: ANESSA CHARLEY MRN: [id number] DOB:[birth year], 56 y.o., female Today's Date: 05/29/2022   PT End of Session - 05/29/22 1445     Visit Number 2    Date for PT Re-Evaluation 08/21/22    Authorization Type cigna    PT Start Time 1400    PT Stop Time 1442    PT Time Calculation (min) 42 min    Activity Tolerance Patient tolerated treatment well    Behavior During Therapy WFL for tasks assessed/performed              Past Medical History:  Diagnosis Date   Acute sinusitis    Allergy    Anxiety    Depression    NO MEDICATIONS - DOING OK NOW   Dizziness    Ear ache    RIGHT --HX OF MULTIPLE EAR INFECTIONS AS A CHILD    GERD (gastroesophageal reflux disease)    Headache(784.0)    MIGRAINES   Leg pain, left    with swelling   Lumbar stenosis    PAIN BACK AND LEFT LEG AND NUMBNESS LEG AND FOOT   Peripheral vascular disease (HCC)    HX OF TREATMENT FOR VARICOSE VEINS RIGHT LEG   Thyroid disease    TOLD BLOOD LEVELS SLIGHTLY ABNORMAL - BUT NOT REQUIRED MEDICATION   Past Surgical History:  Procedure Laterality Date   BACK SURGERY     bladder lift     AT SAME TIME OF HYSTERECTOMY   BRAVO PH STUDY N/A 07/26/2021   Procedure: BRAVO PH STUDY;  Surgeon: Jeani Hawking, MD;  Location: [facility];  Service: Endoscopy;  Laterality: N/A;   CHOLECYSTECTOMY     ESOPHAGOGASTRODUODENOSCOPY (EGD) WITH PROPOFOL N/A 07/26/2021   Procedure: ESOPHAGOGASTRODUODENOSCOPY (EGD) WITH PROPOFOL;  Surgeon: Jeani Hawking, MD;  Location: [facility];  Service: Endoscopy;  Laterality: N/A;   LUMBAR LAMINECTOMY/DECOMPRESSION MICRODISCECTOMY Left 10/24/2014   Procedure: COMPLETE LUMBAR DECOMPRESSION L4-L5 CENTRAL AND MICRODISCECTOMY L4-L5 LEFT;  Surgeon: Jacki Cones, MD;  Location: [facility];  Service: Orthopedics;  Laterality: Left;   POLYPECTOMY  07/26/2021   Procedure: POLYPECTOMY;  Surgeon: Jeani Hawking, MD;  Location: [facility];  Service: Endoscopy;;   VAGINAL HYSTERECTOMY     Patient Active Problem List   Diagnosis Date Noted   Hypothyroidism 11/29/2021   Post-COVID syndrome 06/04/2021   Body mass index (BMI) of 35.0-35.9 in adult 06/04/2020   History of migraine headaches 12/06/2019   Dyslipidemia 06/30/2019   GERD with esophagitis 06/30/2019   Chronic vertigo 04/13/2018   Prediabetes 10/17/2015   Spinal stenosis, lumbar region, with neurogenic claudication 10/24/2014    PCP: [name], MD  REFERRING PROVIDER: Marguerita Beards, MD  REFERRING DIAG: [phone] (ICD-10-CM) - Levator spasm N39.41 (ICD-10-CM) - Urge incontinence  THERAPY DIAG:  Muscle weakness (generalized)  Unspecified lack of coordination  Other muscle spasm  Rationale for Evaluation and Treatment Rehabilitation  ONSET DATE: 4 months  SUBJECTIVE:  SUBJECTIVE STATEMENT: Pt reports her symptoms are about the same.    Fluid intake: Yes: 48oz of water per day, sometimes coffee but not daily     PAIN:  Are you having pain? Yes NPRS scale: 6-7/10 can be as high as 9/10 Pain location: abdominal region lower and bil  Pain type: aching and burning Pain description: intermittent   Aggravating factors: household tasks, bending over, prolonged sitting Relieving factors: resting at end of the day  PRECAUTIONS: None  WEIGHT BEARING RESTRICTIONS No  FALLS:  Has patient fallen in last 6 months? No  LIVING ENVIRONMENT: Lives with: lives with their family Lives in: House/apartment   OCCUPATION: not working  PLOF: Independent  PATIENT GOALS to have less pain  PERTINENT HISTORY:  TVH, TVT and A/P repair. Currently on Myrbetriq for overactive bladder.  Sexual abuse: No  BOWEL MOVEMENT Pain with bowel movement:  No Type of bowel movement:Type (Bristol Stool Scale) 6-4, Frequency daily3-4x , and Strain No Fully empty rectum: Yes:   Leakage: No Pads: No Fiber supplement: No  URINATION Pain with urination: No Fully empty bladder: Yes:   Stream: Strong Urgency: Yes:   Frequency: depends on water intake, with a lot of water it can be every hour but if not every 2 hours Leakage:  standing from bathroom after urinary void Pads: No  INTERCOURSE Pain with intercourse: Initial Penetration, During Penetration, and After Intercourse Ability to have vaginal penetration:  Yes: but painful   PREGNANCY Vaginal deliveries 5 Tearing Yes: all 5 had tearing C-section deliveries 0 Currently pregnant No  PROLAPSE Sometimes pressure feeling    OBJECTIVE:   DIAGNOSTIC FINDINGS:    COGNITION:  Overall cognitive status: Within functional limits for tasks assessed     SENSATION:  Light touch: Appears intact  Proprioception: Appears intact  MUSCLE LENGTH: Bil hamstrings and adductors limited by 25%  LUMBAR SPECIAL TESTS:  SI Compression/distraction test: painful with compression and distraction and FABER test: painful bil                   POSTURE: rounded shoulders and forward head   LUMBARAROM/PROM  A/PROM A/PROM  eval  Flexion Limited by 50%  Extension Limited by 25%  Right lateral flexion Limited by 50%  Left lateral flexion Limited by 50%  Right rotation Limited by 25%  Left rotation Limited by 25%   (Blank rows = not tested)  LOWER EXTREMITY ROM:   Bil WFL  LOWER EXTREMITY MMT:  Bil hip abduction 4/5 pain at Rt side; all other hip 4/5 without pain; knees and ankles 5/5   PALPATION:   General  TTP at bil anterior lateral pelvic region and bil pubic bone; mild fascial restrictions at lower abdomen quadrants                 External Perineal Exam TTP at lower rt bulbocavernosus                             Internal Pelvic Floor TTP at bil superficial and deep layers    Patient confirms identification and approves PT to assess internal pelvic floor and treatment Yes  PELVIC MMT:   MMT eval  Vaginal 3/5; 3s; 4 reps  Internal Anal Sphincter   External Anal Sphincter   Puborectalis   Diastasis Recti   (Blank rows = not tested)        TONE: Slightly increased   PROLAPSE:  Anterior and  posterior wall laxity noted in hooklying with strong cough possible grade 1-2 based on tissue descent  TODAY'S TREATMENT   05/29/22: Self care: pt educated on breathing and voiding mechanics, handouts given as well for carry over at home; lifting mechanics with exhale.    NMRE: x10 diaphragmatic breathing in supine and 3x10 sitting, pt improved mechanics and reported it was easier to complete in sitting. Pt benefited from max verbal cues, demonstration, and tactile cues to complete.  After improved diaphragmatic breathing mechanics, 2x10 pelvic floor contractions coordinated with diaphragmatic breathing for decreased strain at pelvic floor and improved strengthening 2x10 bridges with ball squeeze cued for breathing mechanics    PATIENT EDUCATION:  Education details: [id number] Person educated: Patient Education method: Explanation, Demonstration, Tactile cues, Verbal cues, and Handouts Education comprehension: verbalized understanding and returned demonstration   HOME EXERCISE PROGRAM: [id number]  ASSESSMENT:  CLINICAL IMPRESSION: Patient session focused on NMRE with emphasis on breathing mechanics, coordination of this and pelvic floor and education on pelvic floor relationship with breathing, effects of holding breath on pelvic floor and pressure management. Pt does have history of TVH, TVT and A/P repair and currently does have anterior and posterior wall laxity. Relearning pressure and voiding mechanics is very importance for her foundation of pelvic floor strengthening to decrease risk of re-injury. Pt would benefit from additional PT to further address  deficits.     OBJECTIVE IMPAIRMENTS decreased coordination, decreased endurance, increased fascial restrictions, increased muscle spasms, impaired flexibility, improper body mechanics, postural dysfunction, and pain.   ACTIVITY LIMITATIONS lifting, bending, stairs, continence, and locomotion level  PARTICIPATION LIMITATIONS: meal prep, cleaning, shopping, community activity, and yard work  PERSONAL FACTORS Age, Time since onset of injury/illness/exacerbation, and 1 comorbidity: 5 vaginal births with tearing  are also affecting patient's functional outcome.   REHAB POTENTIAL: Good  CLINICAL DECISION MAKING: Stable/uncomplicated  EVALUATION COMPLEXITY: Low   GOALS: Goals reviewed with patient? Yes  SHORT TERM GOALS: Target date: 06/18/2022  Pt to be I with HEP.  Baseline: Goal status: INITIAL  2.  Pt to report no more than one urinary leakage instance per week for improved QOL.  Baseline:  Goal status: INITIAL  3.  Pt will report 25% reduction of pain due to improvements in posture, strength, and muscle length  Baseline: 7/10 Goal status: INITIAL  4.  Pt to demonstrate improved ability to contract/relax/bulge/relax pelvic floor with minimal cues for improved mobility of tissue and decreased pain. Baseline:  Goal status: INITIAL    LONG TERM GOALS: Target date:  08/21/22    Pt to be I with advanced HEP.  Baseline:  Goal status: INITIAL  2.  Pt to demonstrated improved coordination of pelvic floor and breathing mechanics for decreased pain and strain at pelvic floor at least 75%of the time.  Baseline:  Goal status: INITIAL  3.  Pt to be I with voiding mechanics and breathing mechanics to decrease strain at pelvic floor.  Baseline:  Goal status: INITIAL  4.  Pt to demonstrate at least 4/5 pelvic floor strength and fully relax post contraction for improved pelvic stability and decreased strain at pelvic floor/ decrease leakage.  Baseline:  Goal status:  INITIAL    PLAN: PT FREQUENCY: 1x/week  PT DURATION:  8 sessions  PLANNED INTERVENTIONS: Therapeutic exercises, Therapeutic activity, Neuromuscular re-education, Patient/Family education, Self Care, Joint mobilization, Aquatic Therapy, Dry Needling, Spinal mobilization, Cryotherapy, Moist heat, scar mobilization, Taping, Biofeedback, and Manual therapy  PLAN FOR NEXT SESSION: go over handouts as needed,  manual internal if needed, manual/stretching at hips/core/back/abdomen   Otelia Sergeant, PT, DPT 07/27/232:46 PM

## 2022-06-02 ENCOUNTER — Ambulatory Visit: Payer: Commercial Managed Care - HMO | Admitting: Physical Therapy

## 2022-06-09 ENCOUNTER — Ambulatory Visit: Payer: Commercial Managed Care - HMO | Attending: Obstetrics and Gynecology | Admitting: Physical Therapy

## 2022-06-09 DIAGNOSIS — R293 Abnormal posture: Secondary | ICD-10-CM | POA: Insufficient documentation

## 2022-06-09 DIAGNOSIS — R279 Unspecified lack of coordination: Secondary | ICD-10-CM | POA: Insufficient documentation

## 2022-06-09 DIAGNOSIS — M6281 Muscle weakness (generalized): Secondary | ICD-10-CM | POA: Diagnosis present

## 2022-06-09 DIAGNOSIS — M62838 Other muscle spasm: Secondary | ICD-10-CM | POA: Diagnosis present

## 2022-06-09 NOTE — Therapy (Signed)
OUTPATIENT PHYSICAL THERAPY FEMALE PELVIC EVALUATION   Patient Name: Kendra Rodriguez MRN: [id number] DOB:01/17/1966, 56 y.o., female Today's Date: 06/09/2022   PT End of Session - 06/09/22 0933     Visit Number 3    Date for PT Re-Evaluation 08/21/22    Authorization Type cigna    PT Start Time 0930    PT Stop Time 1008    PT Time Calculation (min) 38 min    Activity Tolerance Patient tolerated treatment well    Behavior During Therapy WFL for tasks assessed/performed              Past Medical History:  Diagnosis Date   Acute sinusitis    Allergy    Anxiety    Depression    NO MEDICATIONS - DOING OK NOW   Dizziness    Ear ache    RIGHT --HX OF MULTIPLE EAR INFECTIONS AS A CHILD    GERD (gastroesophageal reflux disease)    Headache(784.0)    MIGRAINES   Leg pain, left    with swelling   Lumbar stenosis    PAIN BACK AND LEFT LEG AND NUMBNESS LEG AND FOOT   Peripheral vascular disease (HCC)    HX OF TREATMENT FOR VARICOSE VEINS RIGHT LEG   Thyroid disease    TOLD BLOOD LEVELS SLIGHTLY ABNORMAL - BUT NOT REQUIRED MEDICATION   Past Surgical History:  Procedure Laterality Date   BACK SURGERY     bladder lift     AT SAME TIME OF HYSTERECTOMY   BRAVO [place] STUDY N/A 07/26/2021   Procedure: BRAVO [place] STUDY;  Surgeon: Jeani Hawking, MD;  Location: [facility];  Service: Endoscopy;  Laterality: N/A;   CHOLECYSTECTOMY     ESOPHAGOGASTRODUODENOSCOPY (EGD) WITH PROPOFOL N/A 07/26/2021   Procedure: ESOPHAGOGASTRODUODENOSCOPY (EGD) WITH PROPOFOL;  Surgeon: Jeani Hawking, MD;  Location: [facility];  Service: Endoscopy;  Laterality: N/A;   LUMBAR LAMINECTOMY/DECOMPRESSION MICRODISCECTOMY Left 10/24/2014   Procedure: COMPLETE LUMBAR DECOMPRESSION L4-L5 CENTRAL AND MICRODISCECTOMY L4-L5 LEFT;  Surgeon: Jacki Cones, MD;  Location: [facility];  Service: Orthopedics;  Laterality: Left;   POLYPECTOMY  07/26/2021   Procedure: POLYPECTOMY;  Surgeon: Jeani Hawking, MD;  Location: [facility];  Service: Endoscopy;;   VAGINAL HYSTERECTOMY     Patient Active Problem List   Diagnosis Date Noted   Hypothyroidism 11/29/2021   Post-COVID syndrome 06/04/2021   Body mass index (BMI) of 35.0-35.9 in adult 06/04/2020   History of migraine headaches 12/06/2019   Dyslipidemia 06/30/2019   GERD with esophagitis 06/30/2019   Chronic vertigo 04/13/2018   Prediabetes 10/17/2015   Spinal stenosis, lumbar region, with neurogenic claudication 10/24/2014    PCP: [name], MD  REFERRING PROVIDER: Marguerita Beards, MD  REFERRING DIAG: [phone] (ICD-10-CM) - Levator spasm N39.41 (ICD-10-CM) - Urge incontinence  THERAPY DIAG:  Unspecified lack of coordination  Muscle weakness (generalized)  Other muscle spasm  Abnormal posture  Rationale for Evaluation and Treatment Rehabilitation  ONSET DATE: 4 months  SUBJECTIVE:  SUBJECTIVE STATEMENT: Pt states her symptoms have been improving.   Fluid intake: Yes: 48oz of water per day, sometimes coffee but not daily     PAIN:  Are you having pain? Yes NPRS scale: 6-7/10 can be as high as 9/10 Pain location: abdominal region lower and bil  Pain type: aching and burning Pain description: intermittent   Aggravating factors: household tasks, bending over, prolonged sitting Relieving factors: resting at end of the day  PRECAUTIONS: None  WEIGHT BEARING RESTRICTIONS No  FALLS:  Has patient fallen in last 6 months? No  LIVING ENVIRONMENT: Lives with: lives with their family Lives in: House/apartment   OCCUPATION: not working  PLOF: Independent  PATIENT GOALS to have less pain  PERTINENT HISTORY:  TVH, TVT and A/P repair. Currently on Myrbetriq for overactive bladder.  Sexual abuse: No  BOWEL MOVEMENT Pain with  bowel movement: No Type of bowel movement:Type (Bristol Stool Scale) 6-4, Frequency daily3-4x , and Strain No Fully empty rectum: Yes:   Leakage: No Pads: No Fiber supplement: No  URINATION Pain with urination: No Fully empty bladder: Yes:   Stream: Strong Urgency: Yes:   Frequency: depends on water intake, with a lot of water it can be every hour but if not every 2 hours Leakage:  standing from bathroom after urinary void Pads: No  INTERCOURSE Pain with intercourse: Initial Penetration, During Penetration, and After Intercourse Ability to have vaginal penetration:  Yes: but painful   PREGNANCY Vaginal deliveries 5 Tearing Yes: all 5 had tearing C-section deliveries 0 Currently pregnant No  PROLAPSE Sometimes pressure feeling    OBJECTIVE:   DIAGNOSTIC FINDINGS:    COGNITION:  Overall cognitive status: Within functional limits for tasks assessed     SENSATION:  Light touch: Appears intact  Proprioception: Appears intact  MUSCLE LENGTH: Bil hamstrings and adductors limited by 25%  LUMBAR SPECIAL TESTS:  SI Compression/distraction test: painful with compression and distraction and FABER test: painful bil                   POSTURE: rounded shoulders and forward head   LUMBARAROM/PROM  A/PROM A/PROM  eval  Flexion Limited by 50%  Extension Limited by 25%  Right lateral flexion Limited by 50%  Left lateral flexion Limited by 50%  Right rotation Limited by 25%  Left rotation Limited by 25%   (Blank rows = not tested)  LOWER EXTREMITY ROM:   Bil WFL  LOWER EXTREMITY MMT:  Bil hip abduction 4/5 pain at Rt side; all other hip 4/5 without pain; knees and ankles 5/5   PALPATION:   General  TTP at bil anterior lateral pelvic region and bil pubic bone; mild fascial restrictions at lower abdomen quadrants                 External Perineal Exam TTP at lower rt bulbocavernosus                             Internal Pelvic Floor TTP at bil superficial and  deep layers   Patient confirms identification and approves PT to assess internal pelvic floor and treatment Yes  PELVIC MMT:   MMT eval  Vaginal 3/5; 3s; 4 reps  Internal Anal Sphincter   External Anal Sphincter   Puborectalis   Diastasis Recti   (Blank rows = not tested)        TONE: Slightly increased   PROLAPSE:  Anterior and posterior wall  laxity noted in hooklying with strong cough possible grade 1-2 based on tissue descent  TODAY'S TREATMENT   06/09/2022: NMRE: All exercises cued for coordination of pelvic floor and breathing mechanics to decrease strain at prolapse Bridges with ball squeezes 2x10  Sidelying hip abduction with ball press 2x10 each Same knee and hand ball press 2x10 each X10 Sit to stand from mat table body weight with pelvic floor contraction and exhale X10 squats 10# with pelvic floor contraction and exhale Farmers carry 10# and 6# 300' and 750' Mario punches 2x5 6# Core stabilization with red yoga ball for double knee presses x10 and alternating knee press x10  05/29/22: Self care: pt educated on breathing and voiding mechanics, handouts given as well for carry over at home; lifting mechanics with exhale.    NMRE: x10 diaphragmatic breathing in supine and 3x10 sitting, pt improved mechanics and reported it was easier to complete in sitting. Pt benefited from max verbal cues, demonstration, and tactile cues to complete.  After improved diaphragmatic breathing mechanics, 2x10 pelvic floor contractions coordinated with diaphragmatic breathing for decreased strain at pelvic floor and improved strengthening 2x10 bridges with ball squeeze cued for breathing mechanics    PATIENT EDUCATION:  Education details: [id number] Person educated: Patient Education method: Explanation, Demonstration, Tactile cues, Verbal cues, and Handouts Education comprehension: verbalized understanding and returned demonstration   HOME EXERCISE  PROGRAM: [email or web address]  ASSESSMENT:  CLINICAL IMPRESSION: Patient session focused on NMRE with emphasis on coordination of breathing mechanics and pelvic floor with exercises to decrease strain at pelvic floor and prolapse. Cues throughout for improved mechanics and extra time spent to insure proper pressure management and carry over for home. Pt does have history of TVH, TVT and A/P repair and currently does have anterior and posterior wall laxity. Relearning pressure and voiding mechanics is very importance for her foundation of pelvic floor strengthening to decrease risk of re-injury. Pt would benefit from additional PT to further address deficits.     OBJECTIVE IMPAIRMENTS decreased coordination, decreased endurance, increased fascial restrictions, increased muscle spasms, impaired flexibility, improper body mechanics, postural dysfunction, and pain.   ACTIVITY LIMITATIONS lifting, bending, stairs, continence, and locomotion level  PARTICIPATION LIMITATIONS: meal prep, cleaning, shopping, community activity, and yard work  PERSONAL FACTORS Age, Time since onset of injury/illness/exacerbation, and 1 comorbidity: 5 vaginal births with tearing  are also affecting patient's functional outcome.   REHAB POTENTIAL: Good  CLINICAL DECISION MAKING: Stable/uncomplicated  EVALUATION COMPLEXITY: Low   GOALS: Goals reviewed with patient? Yes  SHORT TERM GOALS: Target date: 06/18/2022  Pt to be I with HEP.  Baseline: Goal status: INITIAL  2.  Pt to report no more than one urinary leakage instance per week for improved QOL.  Baseline:  Goal status: INITIAL  3.  Pt will report 25% reduction of pain due to improvements in posture, strength, and muscle length  Baseline: 7/10 Goal status: INITIAL  4.  Pt to demonstrate improved ability to contract/relax/bulge/relax pelvic floor with minimal cues for improved mobility of tissue and decreased pain. Baseline:  Goal status: INITIAL    LONG TERM  GOALS: Target date:  08/21/22    Pt to be I with advanced HEP.  Baseline:  Goal status: INITIAL  2.  Pt to demonstrated improved coordination of pelvic floor and breathing mechanics for decreased pain and strain at pelvic floor at least 75%of the time.  Baseline:  Goal status: INITIAL  3.  Pt to be I with voiding mechanics and breathing mechanics  to decrease strain at pelvic floor.  Baseline:  Goal status: INITIAL  4.  Pt to demonstrate at least 4/5 pelvic floor strength and fully relax post contraction for improved pelvic stability and decreased strain at pelvic floor/ decrease leakage.  Baseline:  Goal status: INITIAL    PLAN: PT FREQUENCY: 1x/week  PT DURATION:  8 sessions  PLANNED INTERVENTIONS: Therapeutic exercises, Therapeutic activity, Neuromuscular re-education, Patient/Family education, Self Care, Joint mobilization, Aquatic Therapy, Dry Needling, Spinal mobilization, Cryotherapy, Moist heat, scar mobilization, Taping, Biofeedback, and Manual therapy  PLAN FOR NEXT SESSION: manual internal if needed, manual/stretching at hips/core/back/abdomen   Otelia Sergeant, PT, DPT 06/09/2309:12 AM

## 2022-06-16 ENCOUNTER — Ambulatory Visit: Payer: Commercial Managed Care - HMO | Admitting: Physical Therapy

## 2022-06-16 DIAGNOSIS — M6281 Muscle weakness (generalized): Secondary | ICD-10-CM

## 2022-06-16 DIAGNOSIS — R279 Unspecified lack of coordination: Secondary | ICD-10-CM | POA: Diagnosis not present

## 2022-06-16 DIAGNOSIS — R293 Abnormal posture: Secondary | ICD-10-CM

## 2022-06-16 NOTE — Patient Instructions (Signed)

## 2022-06-16 NOTE — Addendum Note (Signed)
Addended by: Barbaraann Faster on: 06/16/2022 03:12 PM   Modules accepted: Orders

## 2022-06-16 NOTE — Therapy (Signed)
OUTPATIENT PHYSICAL THERAPY FEMALE PELVIC EVALUATION   Patient Name: Kendra Rodriguez MRN: [id number] DOB:02-12-1966, 56 y.o., female Today's Date: 06/16/2022   PT End of Session - 06/16/22 0940     Visit Number 4    Date for PT Re-Evaluation 08/21/22    Authorization Type cigna    PT Start Time 0845    PT Stop Time 0924    PT Time Calculation (min) 39 min    Activity Tolerance Patient tolerated treatment well    Behavior During Therapy WFL for tasks assessed/performed               Past Medical History:  Diagnosis Date   Acute sinusitis    Allergy    Anxiety    Depression    NO MEDICATIONS - DOING OK NOW   Dizziness    Ear ache    RIGHT --HX OF MULTIPLE EAR INFECTIONS AS A CHILD    GERD (gastroesophageal reflux disease)    Headache(784.0)    MIGRAINES   Leg pain, left    with swelling   Lumbar stenosis    PAIN BACK AND LEFT LEG AND NUMBNESS LEG AND FOOT   Peripheral vascular disease (HCC)    HX OF TREATMENT FOR VARICOSE VEINS RIGHT LEG   Thyroid disease    TOLD BLOOD LEVELS SLIGHTLY ABNORMAL - BUT NOT REQUIRED MEDICATION   Past Surgical History:  Procedure Laterality Date   BACK SURGERY     bladder lift     AT SAME TIME OF HYSTERECTOMY   BRAVO [place] STUDY N/A 07/26/2021   Procedure: BRAVO [place] STUDY;  Surgeon: Carol Ada, MD;  Location: [facility];  Service: Endoscopy;  Laterality: N/A;   CHOLECYSTECTOMY     ESOPHAGOGASTRODUODENOSCOPY (EGD) WITH PROPOFOL N/A 07/26/2021   Procedure: ESOPHAGOGASTRODUODENOSCOPY (EGD) WITH PROPOFOL;  Surgeon: Carol Ada, MD;  Location: [facility];  Service: Endoscopy;  Laterality: N/A;   LUMBAR LAMINECTOMY/DECOMPRESSION MICRODISCECTOMY Left 10/24/2014   Procedure: COMPLETE LUMBAR DECOMPRESSION L4-L5 CENTRAL AND MICRODISCECTOMY L4-L5 LEFT;  Surgeon: Tobi Bastos, MD;  Location: [facility];  Service: Orthopedics;  Laterality: Left;   POLYPECTOMY  07/26/2021   Procedure: POLYPECTOMY;  Surgeon: Carol Ada, MD;  Location: [facility];  Service: Endoscopy;;   VAGINAL HYSTERECTOMY     Patient Active Problem List   Diagnosis Date Noted   Hypothyroidism 11/29/2021   Post-COVID syndrome 06/04/2021   Body mass index (BMI) of 35.0-35.9 in adult 06/04/2020   History of migraine headaches 12/06/2019   Dyslipidemia 06/30/2019   GERD with esophagitis 06/30/2019   Chronic vertigo 04/13/2018   Prediabetes 10/17/2015   Spinal stenosis, lumbar region, with neurogenic claudication 10/24/2014    PCP: Horald Pollen, MD  REFERRING PROVIDER: Jaquita Folds, MD  REFERRING DIAG: [phone] (ICD-10-CM) - Levator spasm N39.41 (ICD-10-CM) - Urge incontinence  THERAPY DIAG:  Muscle weakness (generalized)  Unspecified lack of coordination  Abnormal posture  Rationale for Evaluation and Treatment Rehabilitation  ONSET DATE: 4 months  SUBJECTIVE:  SUBJECTIVE STATEMENT: Pt had one instance of nighttime leakage last night with large loss of urine. Usually wakes 3-4x to urinate per night and drinks fluids until bedtime.   Fluid intake: Yes: 48oz of water per day, sometimes coffee but not daily     PAIN:  Are you having pain? No  PRECAUTIONS: None  WEIGHT BEARING RESTRICTIONS No  FALLS:  Has patient fallen in last 6 months? No  LIVING ENVIRONMENT: Lives with: lives with their family Lives in: House/apartment   OCCUPATION: not working  PLOF: Independent  PATIENT GOALS to have less pain  PERTINENT HISTORY:  TVH, TVT and A/P repair. Currently on Myrbetriq for overactive bladder.  Sexual abuse: No  BOWEL MOVEMENT Pain with bowel movement: No Type of bowel movement:Type (Bristol Stool Scale) 6-4, Frequency daily3-4x , and Strain No Fully empty rectum: Yes:   Leakage: No Pads: No Fiber supplement:  No  URINATION Pain with urination: No Fully empty bladder: Yes:   Stream: Strong Urgency: Yes:   Frequency: depends on water intake, with a lot of water it can be every hour but if not every 2 hours Leakage:  standing from bathroom after urinary void Pads: No  INTERCOURSE Pain with intercourse: Initial Penetration, During Penetration, and After Intercourse Ability to have vaginal penetration:  Yes: but painful   PREGNANCY Vaginal deliveries 5 Tearing Yes: all 5 had tearing C-section deliveries 0 Currently pregnant No  PROLAPSE Sometimes pressure feeling    OBJECTIVE:   DIAGNOSTIC FINDINGS:    COGNITION:  Overall cognitive status: Within functional limits for tasks assessed     SENSATION:  Light touch: Appears intact  Proprioception: Appears intact  MUSCLE LENGTH: Bil hamstrings and adductors limited by 25%  LUMBAR SPECIAL TESTS:  SI Compression/distraction test: painful with compression and distraction and FABER test: painful bil                   POSTURE: rounded shoulders and forward head   LUMBARAROM/PROM  A/PROM A/PROM  eval  Flexion Limited by 50%  Extension Limited by 25%  Right lateral flexion Limited by 50%  Left lateral flexion Limited by 50%  Right rotation Limited by 25%  Left rotation Limited by 25%   (Blank rows = not tested)  LOWER EXTREMITY ROM:   Bil WFL  LOWER EXTREMITY MMT:  Bil hip abduction 4/5 pain at Rt side; all other hip 4/5 without pain; knees and ankles 5/5   PALPATION:   General  TTP at bil anterior lateral pelvic region and bil pubic bone; mild fascial restrictions at lower abdomen quadrants                 External Perineal Exam TTP at lower rt bulbocavernosus                             Internal Pelvic Floor TTP at bil superficial and deep layers   Patient confirms identification and approves PT to assess internal pelvic floor and treatment Yes  PELVIC MMT:   MMT eval  Vaginal 3/5; 3s; 4 reps  Internal  Anal Sphincter   External Anal Sphincter   Puborectalis   Diastasis Recti   (Blank rows = not tested)        TONE: Slightly increased   PROLAPSE:  Anterior and posterior wall laxity noted in hooklying with strong cough possible grade 1-2 based on tissue descent  TODAY'S TREATMENT   06/16/2022: Self Care: pt educated  on urge drill, decreased fluid 1.5 hours before bedtime to decrease nighttime urination and leakage NMRE:   All exercises cued for coordination of pelvic floor and breathing mechanics to decrease strain at prolapse Bridges with ball squeezes 2x10 Opp hand/knee ball press 2x10 Hooklying TA activation with exhale and red band bil shoulder horizontal abduction Seated hip abduction yellow loop 2x10 Quad bird dogs x10 - pressure felt  Quad arm extension x10 no pressure felt with emphasis on breathing and TA activation Palloffs red band x10 Rotational palloffs red band x10 Core stabilization in sitting with yellow ball 2x10 emphasis on TA activation, exhale, and kegel to prevent pressure feelings with good effect.     06/09/2022: NMRE: All exercises cued for coordination of pelvic floor and breathing mechanics to decrease strain at prolapse Bridges with ball squeezes 2x10  Sidelying hip abduction with ball press 2x10 each Same knee and hand ball press 2x10 each X10 Sit to stand from mat table body weight with pelvic floor contraction and exhale X10 squats 10# with pelvic floor contraction and exhale Farmers carry 10# and 6# 300' and 750' Mario punches 2x5 6# Core stabilization with red yoga ball for double knee presses x10 and alternating knee press x10   PATIENT EDUCATION:  Education details: [id number] Person educated: Patient Education method: Explanation, Demonstration, Tactile cues, Verbal cues, and Handouts Education comprehension: verbalized understanding and returned demonstration   HOME EXERCISE PROGRAM: [email or web address]  ASSESSMENT:  CLINICAL  IMPRESSION: Patient session focused on NMRE with emphasis on coordination of breathing mechanics and pelvic floor with exercises to decrease strain at pelvic floor and prolapse. Educated on urge drill to improve time to get to restroom without leakage. Cues throughout for improved mechanics and extra time spent to insure proper pressure management and carry over for home. Pt benefited from tactile cues for TA activation and visual demonstration. Pt does have history of TVH, TVT and A/P repair and currently does have anterior and posterior wall laxity. Relearning pressure and voiding mechanics is very importance for her foundation of pelvic floor strengthening to decrease risk of re-injury. Pt would benefit from additional PT to further address deficits.     OBJECTIVE IMPAIRMENTS decreased coordination, decreased endurance, increased fascial restrictions, increased muscle spasms, impaired flexibility, improper body mechanics, postural dysfunction, and pain.   ACTIVITY LIMITATIONS lifting, bending, stairs, continence, and locomotion level  PARTICIPATION LIMITATIONS: meal prep, cleaning, shopping, community activity, and yard work  PERSONAL FACTORS Age, Time since onset of injury/illness/exacerbation, and 1 comorbidity: 5 vaginal births with tearing  are also affecting patient's functional outcome.   REHAB POTENTIAL: Good  CLINICAL DECISION MAKING: Stable/uncomplicated  EVALUATION COMPLEXITY: Low   GOALS: Goals reviewed with patient? Yes  SHORT TERM GOALS: Target date: 06/18/2022  Pt to be I with HEP.  Baseline: Goal status: INITIAL  2.  Pt to report no more than one urinary leakage instance per week for improved QOL.  Baseline:  Goal status: INITIAL  3.  Pt will report 25% reduction of pain due to improvements in posture, strength, and muscle length  Baseline: 7/10 Goal status: INITIAL  4.  Pt to demonstrate improved ability to contract/relax/bulge/relax pelvic floor with minimal  cues for improved mobility of tissue and decreased pain. Baseline:  Goal status: INITIAL    LONG TERM GOALS: Target date:  08/21/22    Pt to be I with advanced HEP.  Baseline:  Goal status: INITIAL  2.  Pt to demonstrated improved coordination of pelvic floor and breathing mechanics for  decreased pain and strain at pelvic floor at least 75%of the time.  Baseline:  Goal status: INITIAL  3.  Pt to be I with voiding mechanics and breathing mechanics to decrease strain at pelvic floor.  Baseline:  Goal status: INITIAL  4.  Pt to demonstrate at least 4/5 pelvic floor strength and fully relax post contraction for improved pelvic stability and decreased strain at pelvic floor/ decrease leakage.  Baseline:  Goal status: INITIAL    PLAN: PT FREQUENCY: 1x/week  PT DURATION:  8 sessions  PLANNED INTERVENTIONS: Therapeutic exercises, Therapeutic activity, Neuromuscular re-education, Patient/Family education, Self Care, Joint mobilization, Aquatic Therapy, Dry Needling, Spinal mobilization, Cryotherapy, Moist heat, scar mobilization, Taping, Biofeedback, and Manual therapy  PLAN FOR NEXT SESSION: manual internal if needed, manual/stretching at hips/core/back/abdomen   Stacy Gardner, PT, DPT 08/14/239:41 AM

## 2022-06-25 ENCOUNTER — Ambulatory Visit: Payer: Commercial Managed Care - HMO | Admitting: Physical Therapy

## 2022-06-30 ENCOUNTER — Ambulatory Visit: Payer: Commercial Managed Care - HMO | Admitting: Physical Therapy

## 2022-06-30 DIAGNOSIS — M6281 Muscle weakness (generalized): Secondary | ICD-10-CM

## 2022-06-30 DIAGNOSIS — R279 Unspecified lack of coordination: Secondary | ICD-10-CM

## 2022-06-30 DIAGNOSIS — R293 Abnormal posture: Secondary | ICD-10-CM

## 2022-06-30 NOTE — Therapy (Signed)
OUTPATIENT PHYSICAL THERAPY FEMALE PELVIC TREATMENT   Patient Name: Kendra Rodriguez MRN: [id number] DOB:October 27, 1966, 56 y.o., female Today's Date: 06/30/2022   PT End of Session - 06/30/22 1238     Visit Number 5    Date for PT Re-Evaluation 08/21/22    Authorization Type cigna    PT Start Time 1236   pt arrival time   PT Stop Time 1315    PT Time Calculation (min) 39 min    Activity Tolerance Patient tolerated treatment well    Behavior During Therapy WFL for tasks assessed/performed               Past Medical History:  Diagnosis Date   Acute sinusitis    Allergy    Anxiety    Depression    NO MEDICATIONS - DOING OK NOW   Dizziness    Ear ache    RIGHT --HX OF MULTIPLE EAR INFECTIONS AS A CHILD    GERD (gastroesophageal reflux disease)    Headache(784.0)    MIGRAINES   Leg pain, left    with swelling   Lumbar stenosis    PAIN BACK AND LEFT LEG AND NUMBNESS LEG AND FOOT   Peripheral vascular disease ([place])    HX OF TREATMENT FOR VARICOSE VEINS RIGHT LEG   Thyroid disease    TOLD BLOOD LEVELS SLIGHTLY ABNORMAL - BUT NOT REQUIRED MEDICATION   Past Surgical History:  Procedure Laterality Date   BACK SURGERY     bladder lift     AT SAME TIME OF HYSTERECTOMY   BRAVO [place] STUDY N/A 07/26/2021   Procedure: BRAVO [place] STUDY;  Surgeon: Carol Ada, MD;  Location: [facility];  Service: Endoscopy;  Laterality: N/A;   CHOLECYSTECTOMY     ESOPHAGOGASTRODUODENOSCOPY (EGD) WITH PROPOFOL N/A 07/26/2021   Procedure: ESOPHAGOGASTRODUODENOSCOPY (EGD) WITH PROPOFOL;  Surgeon: Carol Ada, MD;  Location: [facility];  Service: Endoscopy;  Laterality: N/A;   LUMBAR LAMINECTOMY/DECOMPRESSION MICRODISCECTOMY Left 10/24/2014   Procedure: COMPLETE LUMBAR DECOMPRESSION L4-L5 CENTRAL AND MICRODISCECTOMY L4-L5 LEFT;  Surgeon: Tobi Bastos, MD;  Location: [facility];  Service: Orthopedics;  Laterality: Left;   POLYPECTOMY  07/26/2021   Procedure: POLYPECTOMY;  Surgeon: Carol Ada,  MD;  Location: [facility];  Service: Endoscopy;;   VAGINAL HYSTERECTOMY     Patient Active Problem List   Diagnosis Date Noted   Hypothyroidism 11/29/2021   Post-COVID syndrome 06/04/2021   Body mass index (BMI) of 35.0-35.9 in adult 06/04/2020   History of migraine headaches 12/06/2019   Dyslipidemia 06/30/2019   GERD with esophagitis 06/30/2019   Chronic vertigo 04/13/2018   Prediabetes 10/17/2015   Spinal stenosis, lumbar region, with neurogenic claudication 10/24/2014    PCP: Horald Pollen, MD  REFERRING PROVIDER: Jaquita Folds, MD  REFERRING DIAG: [phone] (ICD-10-CM) - Levator spasm N39.41 (ICD-10-CM) - Urge incontinence  THERAPY DIAG:  Muscle weakness (generalized)  Unspecified lack of coordination  Abnormal posture  Rationale for Evaluation and Treatment Rehabilitation  ONSET DATE: 4 months  SUBJECTIVE:  SUBJECTIVE STATEMENT: Pt reports she has no leakage at all, now 3-4x to urinate at night  but without leakage now. Pt has no pain but reports pressure felt at vaginal area and when this feels worse does also feel it at rectum. Worse with sitting for longer periods of time or with strong urinary urge now.   Fluid intake: Yes: 48oz of water per day, sometimes coffee but not daily     PAIN:  Are you having pain? No  PRECAUTIONS: None  WEIGHT BEARING RESTRICTIONS No  FALLS:  Has patient fallen in last 6 months? No  LIVING ENVIRONMENT: Lives with: lives with their family Lives in: House/apartment   OCCUPATION: not working  PLOF: Independent  PATIENT GOALS to have less pain  PERTINENT HISTORY:  TVH, TVT and A/P repair. Currently on Myrbetriq for overactive bladder.  Sexual abuse: No  BOWEL MOVEMENT Pain with bowel movement: No Type of bowel  movement:Type (Bristol Stool Scale) 6-4, Frequency daily3-4x , and Strain No Fully empty rectum: Yes:   Leakage: No Pads: No Fiber supplement: No  URINATION Pain with urination: No Fully empty bladder: Yes:   Stream: Strong Urgency: Yes:   Frequency: depends on water intake, with a lot of water it can be every hour but if not every 2 hours Leakage:  standing from bathroom after urinary void Pads: No  INTERCOURSE Pain with intercourse: Initial Penetration, During Penetration, and After Intercourse Ability to have vaginal penetration:  Yes: but painful   PREGNANCY Vaginal deliveries 5 Tearing Yes: all 5 had tearing C-section deliveries 0 Currently pregnant No  PROLAPSE Sometimes pressure feeling    OBJECTIVE:   DIAGNOSTIC FINDINGS:    COGNITION:  Overall cognitive status: Within functional limits for tasks assessed     SENSATION:  Light touch: Appears intact  Proprioception: Appears intact  MUSCLE LENGTH: Bil hamstrings and adductors limited by 25%  LUMBAR SPECIAL TESTS:  SI Compression/distraction test: painful with compression and distraction and FABER test: painful bil                   POSTURE: rounded shoulders and forward head   LUMBARAROM/PROM  A/PROM A/PROM  eval  Flexion Limited by 50%  Extension Limited by 25%  Right lateral flexion Limited by 50%  Left lateral flexion Limited by 50%  Right rotation Limited by 25%  Left rotation Limited by 25%   (Blank rows = not tested)  LOWER EXTREMITY ROM:   Bil WFL  LOWER EXTREMITY MMT:  Bil hip abduction 4/5 pain at Rt side; all other hip 4/5 without pain; knees and ankles 5/5   PALPATION:   General  TTP at bil anterior lateral pelvic region and bil pubic bone; mild fascial restrictions at lower abdomen quadrants                 External Perineal Exam TTP at lower rt bulbocavernosus                             Internal Pelvic Floor TTP at bil superficial and deep layers   Patient confirms  identification and approves PT to assess internal pelvic floor and treatment Yes  PELVIC MMT:   MMT eval 06/30/22   Vaginal 3/5; 3s; 4 reps 4/5, 10 reps, 4s isometrics  Internal Anal Sphincter    External Anal Sphincter    Puborectalis    Diastasis Recti    (Blank rows = not tested)  TONE: Slightly increased   PROLAPSE:  Anterior and posterior wall laxity noted in hooklying with strong cough possible grade 1-2 based on tissue descent  TODAY'S TREATMENT   06/30/2022: Pt consented to internal vaginal treatment today, findings above.  2x10 of contractions, 1O10 quick flicks, R60 isometric holds for 5s Pt educated on prolapse relief positions and updated HEP, handouts given.  2x10 blue band resisted bridges 2x10 blue band dead lifts 2x10 blue band squats Blue band rotational palloffs x10 each    PATIENT EDUCATION:  Education details: [id number] Person educated: Patient Education method: Explanation, Demonstration, Tactile cues, Verbal cues, and Handouts Education comprehension: verbalized understanding and returned demonstration   HOME EXERCISE PROGRAM: [id number]  ASSESSMENT:  CLINICAL IMPRESSION: Patient session focused on internal vaginal treatment with NMRE for progressing training of HEP for quick flicks and isometrics and cues for improving technique given during treatment and NMRE with emphasis on coordination of breathing mechanics and pelvic floor with exercises to decrease strain at pelvic floor and prolapse. Educated on updated HEP and prolapse relief positions. Cues throughout for improved mechanics and extra time spent to insure proper pressure management and carry over for home. Pt does have history of TVH, TVT and A/P repair and currently does have anterior and posterior wall laxity. Relearning pressure and voiding mechanics is very importance for her foundation of pelvic floor strengthening to decrease risk of re-injury. Pt would benefit from additional PT to  further address deficits.     OBJECTIVE IMPAIRMENTS decreased coordination, decreased endurance, increased fascial restrictions, increased muscle spasms, impaired flexibility, improper body mechanics, postural dysfunction, and pain.   ACTIVITY LIMITATIONS lifting, bending, stairs, continence, and locomotion level  PARTICIPATION LIMITATIONS: meal prep, cleaning, shopping, community activity, and yard work  PERSONAL FACTORS Age, Time since onset of injury/illness/exacerbation, and 1 comorbidity: 5 vaginal births with tearing  are also affecting patient's functional outcome.   REHAB POTENTIAL: Good  CLINICAL DECISION MAKING: Stable/uncomplicated  EVALUATION COMPLEXITY: Low   GOALS: Goals reviewed with patient? Yes  SHORT TERM GOALS: Target date: 06/18/2022  Pt to be I with HEP.  Baseline: Goal status: MET  2.  Pt to report no more than one urinary leakage instance per week for improved QOL.  Baseline:  Goal status: MET  3.  Pt will report 25% reduction of pain due to improvements in posture, strength, and muscle length  Baseline: 7/10 Goal status: MET  4.  Pt to demonstrate improved ability to contract/relax/bulge/relax pelvic floor with minimal cues for improved mobility of tissue and decreased pain. Baseline:  Goal status: MET    LONG TERM GOALS: Target date:  08/21/22    Pt to be I with advanced HEP.  Baseline:  Goal status: INITIAL  2.  Pt to demonstrated improved coordination of pelvic floor and breathing mechanics for decreased pain and strain at pelvic floor at least 75%of the time.  Baseline:  Goal status: INITIAL  3.  Pt to be I with voiding mechanics and breathing mechanics to decrease strain at pelvic floor.  Baseline:  Goal status: INITIAL  4.  Pt to demonstrate at least 4/5 pelvic floor strength and fully relax post contraction for improved pelvic stability and decreased strain at pelvic floor/ decrease leakage.  Baseline:  Goal status:  INITIAL    PLAN: PT FREQUENCY: 1x/week  PT DURATION:  8 sessions  PLANNED INTERVENTIONS: Therapeutic exercises, Therapeutic activity, Neuromuscular re-education, Patient/Family education, Self Care, Joint mobilization, Aquatic Therapy, Dry Needling, Spinal mobilization, Cryotherapy, Moist heat, scar mobilization, Taping,  Biofeedback, and Manual therapy  PLAN FOR NEXT SESSION: manual internal if needed, manual/stretching at hips/core/back/abdomen   Stacy Gardner, PT, DPT 08/28/232:12 PM

## 2022-07-08 ENCOUNTER — Ambulatory Visit: Payer: Commercial Managed Care - HMO | Attending: Obstetrics and Gynecology | Admitting: Physical Therapy

## 2022-07-08 DIAGNOSIS — R279 Unspecified lack of coordination: Secondary | ICD-10-CM | POA: Diagnosis present

## 2022-07-08 DIAGNOSIS — R293 Abnormal posture: Secondary | ICD-10-CM | POA: Insufficient documentation

## 2022-07-08 DIAGNOSIS — M6281 Muscle weakness (generalized): Secondary | ICD-10-CM | POA: Diagnosis present

## 2022-07-08 NOTE — Therapy (Signed)
OUTPATIENT PHYSICAL THERAPY FEMALE PELVIC TREATMENT   Patient Name: Kendra Rodriguez MRN: [id number] DOB:22-Apr-1966, 56 y.o., female Today's Date: 07/08/2022   PT End of Session - 07/08/22 0930     Visit Number 6    Date for PT Re-Evaluation 08/21/22    Authorization Type cigna    PT Start Time [phone]    PT Stop Time 1016    PT Time Calculation (min) 48 min    Activity Tolerance Patient tolerated treatment well    Behavior During Therapy WFL for tasks assessed/performed               Past Medical History:  Diagnosis Date   Acute sinusitis    Allergy    Anxiety    Depression    NO MEDICATIONS - DOING OK NOW   Dizziness    Ear ache    RIGHT --HX OF MULTIPLE EAR INFECTIONS AS A CHILD    GERD (gastroesophageal reflux disease)    Headache(784.0)    MIGRAINES   Leg pain, left    with swelling   Lumbar stenosis    PAIN BACK AND LEFT LEG AND NUMBNESS LEG AND FOOT   Peripheral vascular disease ([place])    HX OF TREATMENT FOR VARICOSE VEINS RIGHT LEG   Thyroid disease    TOLD BLOOD LEVELS SLIGHTLY ABNORMAL - BUT NOT REQUIRED MEDICATION   Past Surgical History:  Procedure Laterality Date   BACK SURGERY     bladder lift     AT SAME TIME OF HYSTERECTOMY   BRAVO [place] STUDY N/A 07/26/2021   Procedure: BRAVO [place] STUDY;  Surgeon: Carol Ada, MD;  [facility]: [facility];  Service: Endoscopy;  Laterality: N/A;   CHOLECYSTECTOMY     ESOPHAGOGASTRODUODENOSCOPY (EGD) WITH PROPOFOL N/A 07/26/2021   Procedure: ESOPHAGOGASTRODUODENOSCOPY (EGD) WITH PROPOFOL;  Surgeon: Carol Ada, MD;  Location: [facility];  Service: Endoscopy;  Laterality: N/A;   LUMBAR LAMINECTOMY/DECOMPRESSION MICRODISCECTOMY Left 10/24/2014   Procedure: COMPLETE LUMBAR DECOMPRESSION L4-L5 CENTRAL AND MICRODISCECTOMY L4-L5 LEFT;  Surgeon: Tobi Bastos, MD;  Location: [facility];  Service: Orthopedics;  Laterality: Left;   POLYPECTOMY  07/26/2021   Procedure: POLYPECTOMY;  Surgeon: Carol Ada, MD;  [facility]: [facility];  Service: Endoscopy;;   VAGINAL HYSTERECTOMY     Patient Active Problem List   Diagnosis Date Noted   Hypothyroidism 11/29/2021   Post-COVID syndrome 06/04/2021   Body mass index (BMI) of 35.0-35.9 in adult 06/04/2020   History of migraine headaches 12/06/2019   Dyslipidemia 06/30/2019   GERD with esophagitis 06/30/2019   Chronic vertigo 04/13/2018   Prediabetes 10/17/2015   Spinal stenosis, lumbar region, with neurogenic claudication 10/24/2014    PCP: Horald Pollen, MD  REFERRING PROVIDER: Jaquita Folds, MD  REFERRING DIAG: [phone] (ICD-10-CM) - Levator spasm N39.41 (ICD-10-CM) - Urge incontinence  THERAPY DIAG:  Muscle weakness (generalized)  Abnormal posture  Unspecified lack of coordination  Rationale for Evaluation and Treatment Rehabilitation  ONSET DATE: 4 months  SUBJECTIVE:  SUBJECTIVE STATEMENT: Pt reports she has had no leakage but reports pressure vaginally is the same as last time (worse with prolonged sitting and lifting/housework) and reports this is the same time as back pain is worse as well. Pt has not been taking myrbetriq   Fluid intake: Yes: 48oz of water per day, sometimes coffee but not daily     PAIN:  Are you having pain? Yes 6/10 low back bil  PRECAUTIONS: None  WEIGHT BEARING RESTRICTIONS No  FALLS:  Has patient fallen in last 6 months? No  LIVING ENVIRONMENT: Lives with: lives with their family Lives in: House/apartment   OCCUPATION: not working  PLOF: Independent  PATIENT GOALS to have less pain  PERTINENT HISTORY:  TVH, TVT and A/P repair. Currently on Myrbetriq for overactive bladder.  Sexual abuse: No  BOWEL MOVEMENT Pain with bowel movement: No Type of bowel movement:Type (Bristol Stool Scale) 6-4,  Frequency daily3-4x , and Strain No Fully empty rectum: Yes:   Leakage: No Pads: No Fiber supplement: No  URINATION Pain with urination: No Fully empty bladder: Yes:   Stream: Strong Urgency: Yes:   Frequency: depends on water intake, with a lot of water it can be every hour but if not every 2 hours Leakage:  standing from bathroom after urinary void Pads: No  INTERCOURSE Pain with intercourse: Initial Penetration, During Penetration, and After Intercourse Ability to have vaginal penetration:  Yes: but painful   PREGNANCY Vaginal deliveries 5 Tearing Yes: all 5 had tearing C-section deliveries 0 Currently pregnant No  PROLAPSE Sometimes pressure feeling    OBJECTIVE:   DIAGNOSTIC FINDINGS:    COGNITION:  Overall cognitive status: Within functional limits for tasks assessed     SENSATION:  Light touch: Appears intact  Proprioception: Appears intact  MUSCLE LENGTH: Bil hamstrings and adductors limited by 25%  LUMBAR SPECIAL TESTS:  SI Compression/distraction test: painful with compression and distraction and FABER test: painful bil                   POSTURE: rounded shoulders and forward head   LUMBARAROM/PROM  A/PROM A/PROM  eval  Flexion Limited by 50%  Extension Limited by 25%  Right lateral flexion Limited by 50%  Left lateral flexion Limited by 50%  Right rotation Limited by 25%  Left rotation Limited by 25%   (Blank rows = not tested)  LOWER EXTREMITY ROM:   Bil WFL  LOWER EXTREMITY MMT:  Bil hip abduction 4/5 pain at Rt side; all other hip 4/5 without pain; knees and ankles 5/5   PALPATION:   General  TTP at bil anterior lateral pelvic region and bil pubic bone; mild fascial restrictions at lower abdomen quadrants                 External Perineal Exam TTP at lower rt bulbocavernosus                             Internal Pelvic Floor TTP at bil superficial and deep layers   Patient confirms identification and approves PT to assess  internal pelvic floor and treatment Yes  PELVIC MMT:   MMT eval 06/30/22   Vaginal 3/5; 3s; 4 reps 4/5, 10 reps, 4s isometrics  Internal Anal Sphincter    External Anal Sphincter    Puborectalis    Diastasis Recti    (Blank rows = not tested)        TONE: Slightly increased  PROLAPSE:  Anterior and posterior wall laxity noted in hooklying with strong cough possible grade 1-2 based on tissue descent  TODAY'S TREATMENT   07/08/22: Unilateral happy baby 2x30s each Hamstring stretch 2x30s with strap Lumbar rotation x10 X10 bridges X10 TA activations in hooklying  Manual: addaday used by PT at pt's low back bil from T8-S1 with noted tension throughout bil paraspinals and sacrum pt reports decreased pain from 6-4/10 Manual with soft tissue massage and tension release at Lt paraspinals and sacral springing completed with pt reporting Lt side  felt worse that rt and had worse tension on this as well.  Pt reports she has not attempted moisturizer yet and educated on attempting to see if this helps heaviness/dragging sensation from prolapse as pt reports she feels dry internally and externally.  06/30/2022: Pt consented to internal vaginal treatment today, findings above.  2x10 of contractions, 8I69 quick flicks, G29 isometric holds for 5s Pt educated on prolapse relief positions and updated HEP, handouts given.  2x10 blue band resisted bridges 2x10 blue band dead lifts 2x10 blue band squats Blue band rotational palloffs x10 each    PATIENT EDUCATION:  Education details: [id number] Person educated: Patient Education method: Explanation, Demonstration, Tactile cues, Verbal cues, and Handouts Education comprehension: verbalized understanding and returned demonstration   HOME EXERCISE PROGRAM: [id number]  ASSESSMENT:  CLINICAL IMPRESSION: Patient session focused on thoracic and lower back stretching for improved pain levels and decreased tension. Pt reports her back pain is  worse with prolapse heaviness/pressure feeling is worse and this is usually with heavy housework. Pt had 6/20 pain at low back today and reports this has been present this week with her needing to do a lot of housework, ended 4/10. Pt educated on moisturizer to see if this helps with pressure/dragging feeling of prolapse. Pt does have history of TVH, TVT and A/P repair and currently does have anterior and posterior wall laxity. Relearning pressure and voiding mechanics is very importance for her foundation of pelvic floor strengthening to decrease risk of re-injury. Pt would benefit from additional PT to further address deficits.     OBJECTIVE IMPAIRMENTS decreased coordination, decreased endurance, increased fascial restrictions, increased muscle spasms, impaired flexibility, improper body mechanics, postural dysfunction, and pain.   ACTIVITY LIMITATIONS lifting, bending, stairs, continence, and locomotion level  PARTICIPATION LIMITATIONS: meal prep, cleaning, shopping, community activity, and yard work  PERSONAL FACTORS Age, Time since onset of injury/illness/exacerbation, and 1 comorbidity: 5 vaginal births with tearing  are also affecting patient's functional outcome.   REHAB POTENTIAL: Good  CLINICAL DECISION MAKING: Stable/uncomplicated  EVALUATION COMPLEXITY: Low   GOALS: Goals reviewed with patient? Yes  SHORT TERM GOALS: Target date: 06/18/2022  Pt to be I with HEP.  Baseline: Goal status: MET  2.  Pt to report no more than one urinary leakage instance per week for improved QOL.  Baseline:  Goal status: MET  3.  Pt will report 25% reduction of pain due to improvements in posture, strength, and muscle length  Baseline: 7/10 Goal status: MET  4.  Pt to demonstrate improved ability to contract/relax/bulge/relax pelvic floor with minimal cues for improved mobility of tissue and decreased pain. Baseline:  Goal status: MET    LONG TERM GOALS: Target date:  08/21/22    Pt  to be I with advanced HEP.  Baseline:  Goal status: INITIAL  2.  Pt to demonstrated improved coordination of pelvic floor and breathing mechanics for decreased pain and strain at pelvic floor at least  75%of the time.  Baseline:  Goal status: INITIAL  3.  Pt to be I with voiding mechanics and breathing mechanics to decrease strain at pelvic floor.  Baseline:  Goal status: INITIAL  4.  Pt to demonstrate at least 4/5 pelvic floor strength and fully relax post contraction for improved pelvic stability and decreased strain at pelvic floor/ decrease leakage.  Baseline:  Goal status: INITIAL    PLAN: PT FREQUENCY: 1x/week  PT DURATION:  8 sessions  PLANNED INTERVENTIONS: Therapeutic exercises, Therapeutic activity, Neuromuscular re-education, Patient/Family education, Self Care, Joint mobilization, Aquatic Therapy, Dry Needling, Spinal mobilization, Cryotherapy, Moist heat, scar mobilization, Taping, Biofeedback, and Manual therapy  PLAN FOR NEXT SESSION: manual internal if needed, manual/stretching at hips/core/back/abdomen   Stacy Gardner, PT, DPT 07/08/2309:24 AM

## 2022-07-14 ENCOUNTER — Ambulatory Visit: Payer: Commercial Managed Care - HMO | Admitting: Physical Therapy

## 2022-07-14 DIAGNOSIS — M6281 Muscle weakness (generalized): Secondary | ICD-10-CM

## 2022-07-14 DIAGNOSIS — R279 Unspecified lack of coordination: Secondary | ICD-10-CM

## 2022-07-14 DIAGNOSIS — R293 Abnormal posture: Secondary | ICD-10-CM

## 2022-07-14 NOTE — Therapy (Addendum)
OUTPATIENT PHYSICAL THERAPY FEMALE PELVIC TREATMENT   Patient Name: Kendra Rodriguez MRN: [id number] DOB:1965/11/05, 56 y.o., female Today's Date: 07/14/2022   PT End of Session - 07/14/22 1104     Visit Number 7    Date for PT Re-Evaluation 08/21/22    Authorization Type cigna    PT Start Time 1100    PT Stop Time 1142    PT Time Calculation (min) 42 min    Activity Tolerance Patient tolerated treatment well    Behavior During Therapy WFL for tasks assessed/performed               Past Medical History:  Diagnosis Date   Acute sinusitis    Allergy    Anxiety    Depression    NO MEDICATIONS - DOING OK NOW   Dizziness    Ear ache    RIGHT --HX OF MULTIPLE EAR INFECTIONS AS A CHILD    GERD (gastroesophageal reflux disease)    Headache(784.0)    MIGRAINES   Leg pain, left    with swelling   Lumbar stenosis    PAIN BACK AND LEFT LEG AND NUMBNESS LEG AND FOOT   Peripheral vascular disease (HCC)    HX OF TREATMENT FOR VARICOSE VEINS RIGHT LEG   Thyroid disease    TOLD BLOOD LEVELS SLIGHTLY ABNORMAL - BUT NOT REQUIRED MEDICATION   Past Surgical History:  Procedure Laterality Date   BACK SURGERY     bladder lift     AT SAME TIME OF HYSTERECTOMY   BRAVO [place] STUDY N/A 07/26/2021   Procedure: BRAVO [place] STUDY;  Surgeon: Carol Ada, MD;  Location: [facility];  Service: Endoscopy;  Laterality: N/A;   CHOLECYSTECTOMY     ESOPHAGOGASTRODUODENOSCOPY (EGD) WITH PROPOFOL N/A 07/26/2021   Procedure: ESOPHAGOGASTRODUODENOSCOPY (EGD) WITH PROPOFOL;  Surgeon: Carol Ada, MD;  Location: [facility];  Service: Endoscopy;  Laterality: N/A;   LUMBAR LAMINECTOMY/DECOMPRESSION MICRODISCECTOMY Left 10/24/2014   Procedure: COMPLETE LUMBAR DECOMPRESSION L4-L5 CENTRAL AND MICRODISCECTOMY L4-L5 LEFT;  Surgeon: Tobi Bastos, MD;  Location: [facility];  Service: Orthopedics;  Laterality: Left;   POLYPECTOMY  07/26/2021   Procedure: POLYPECTOMY;  Surgeon: Carol Ada, MD;  Location: [facility];  Service: Endoscopy;;   VAGINAL HYSTERECTOMY     Patient Active Problem List   Diagnosis Date Noted   Hypothyroidism 11/29/2021   Post-COVID syndrome 06/04/2021   Body mass index (BMI) of 35.0-35.9 in adult 06/04/2020   History of migraine headaches 12/06/2019   Dyslipidemia 06/30/2019   GERD with esophagitis 06/30/2019   Chronic vertigo 04/13/2018   Prediabetes 10/17/2015   Spinal stenosis, lumbar region, with neurogenic claudication 10/24/2014    PCP: Horald Pollen, MD  REFERRING PROVIDER: Jaquita Folds, MD  REFERRING DIAG: [phone] (ICD-10-CM) - Levator spasm N39.41 (ICD-10-CM) - Urge incontinence  THERAPY DIAG:  Muscle weakness (generalized)  Unspecified lack of coordination  Abnormal posture  Rationale for Evaluation and Treatment Rehabilitation  ONSET DATE: 4 months  SUBJECTIVE:  SUBJECTIVE STATEMENT: Pt reports symptoms are at same level of improvement since last visit. Pt reports she feels pressure vaginally worse in sitting but does still feels some pressure all the time. Pt states she hasn't tried moisturizer yet but she has been doing urge drill as needed to bladder train for improved frequency and only getting up 1-2x per night now.   Fluid intake: Yes: 48oz of water per day, sometimes coffee but not daily     PAIN:  Are you having pain? Yes 5/10 low back bil  PRECAUTIONS: None  WEIGHT BEARING RESTRICTIONS No  FALLS:  Has patient fallen in last 6 months? No  LIVING ENVIRONMENT: Lives with: lives with their family Lives in: House/apartment   OCCUPATION: not working  PLOF: Independent  PATIENT GOALS to have less pain  PERTINENT HISTORY:  TVH, TVT and A/P repair. Currently on Myrbetriq for overactive bladder.  Sexual abuse: No  BOWEL  MOVEMENT Pain with bowel movement: No Type of bowel movement:Type (Bristol Stool Scale) 6-4, Frequency daily3-4x , and Strain No Fully empty rectum: Yes:   Leakage: No Pads: No Fiber supplement: No  URINATION Pain with urination: No Fully empty bladder: Yes:   Stream: Strong Urgency: Yes:   Frequency: depends on water intake, with a lot of water it can be every hour but if not every 2 hours Leakage:  standing from bathroom after urinary void Pads: No  INTERCOURSE Pain with intercourse: Initial Penetration, During Penetration, and After Intercourse Ability to have vaginal penetration:  Yes: but painful   PREGNANCY Vaginal deliveries 5 Tearing Yes: all 5 had tearing C-section deliveries 0 Currently pregnant No  PROLAPSE Sometimes pressure feeling    OBJECTIVE:   DIAGNOSTIC FINDINGS:    COGNITION:  Overall cognitive status: Within functional limits for tasks assessed     SENSATION:  Light touch: Appears intact  Proprioception: Appears intact  MUSCLE LENGTH: Bil hamstrings and adductors limited by 25%  LUMBAR SPECIAL TESTS:  SI Compression/distraction test: painful with compression and distraction and FABER test: painful bil                   POSTURE: rounded shoulders and forward head   LUMBARAROM/PROM  A/PROM A/PROM  eval  Flexion Limited by 50%  Extension Limited by 25%  Right lateral flexion Limited by 50%  Left lateral flexion Limited by 50%  Right rotation Limited by 25%  Left rotation Limited by 25%   (Blank rows = not tested)  LOWER EXTREMITY ROM:   Bil WFL  LOWER EXTREMITY MMT:  Bil hip abduction 4/5 pain at Rt side; all other hip 4/5 without pain; knees and ankles 5/5   PALPATION:   General  TTP at bil anterior lateral pelvic region and bil pubic bone; mild fascial restrictions at lower abdomen quadrants                 External Perineal Exam TTP at lower rt bulbocavernosus                             Internal Pelvic Floor TTP at  bil superficial and deep layers   Patient confirms identification and approves PT to assess internal pelvic floor and treatment Yes  PELVIC MMT:   MMT eval 06/30/22   Vaginal 3/5; 3s; 4 reps 4/5, 10 reps, 4s isometrics  Internal Anal Sphincter    External Anal Sphincter    Puborectalis    Diastasis Recti    (  Blank rows = not tested)        TONE: Slightly increased   PROLAPSE:  Anterior and posterior wall laxity noted in hooklying with strong cough possible grade 1-2 based on tissue descent  TODAY'S TREATMENT   07/14/2022: 2x10 bridges with pelvic floor coordination   2x10 TA activations (extra time for manual assistance and verbal cues to complete in good technique)  2x10 pelvic tilts (extra time for manual assistance and verbal cues to complete in good technique)  2x10 alt hip flexion with TA activation  Black loop 2x10 hip abduction with TA activation hip flexion 2x10 with TA activation  Bil shoulder horizontal abduction blue band 2x10 with TA activation Moist heat at low back with diaphragmatic breathing at end of session to relax core and pelvic floor and pt and daughter educated on pressure management continued at home    PATIENT EDUCATION:  Education details: [id number] Person educated: Patient Education method: Explanation, Demonstration, Tactile cues, Verbal cues, and Handouts Education comprehension: verbalized understanding and returned demonstration   HOME EXERCISE PROGRAM: [id number]  ASSESSMENT:  CLINICAL IMPRESSION: Patient session focused on core activation with coordination of breathing and pelvic floor as able for decreased strain at back and pelvic floor. Pt continues to need moderate cues for coordination and breathing mechanics and to not hold breath with activity to decrease stain at pelvic floor. Pt does endorse when extra time to contract TA pressure at vaginal area is much lower during activity. Pt does have history of TVH, TVT and A/P repair and  currently does have anterior and posterior wall laxity. Relearning pressure and voiding mechanics is very importance for her foundation of pelvic floor strengthening to decrease risk of re-injury. Pt would benefit from additional PT to further address deficits.     OBJECTIVE IMPAIRMENTS decreased coordination, decreased endurance, increased fascial restrictions, increased muscle spasms, impaired flexibility, improper body mechanics, postural dysfunction, and pain.   ACTIVITY LIMITATIONS lifting, bending, stairs, continence, and locomotion level  PARTICIPATION LIMITATIONS: meal prep, cleaning, shopping, community activity, and yard work  PERSONAL FACTORS Age, Time since onset of injury/illness/exacerbation, and 1 comorbidity: 5 vaginal births with tearing  are also affecting patient's functional outcome.   REHAB POTENTIAL: Good  CLINICAL DECISION MAKING: Stable/uncomplicated  EVALUATION COMPLEXITY: Low   GOALS: Goals reviewed with patient? Yes  SHORT TERM GOALS: Target date: 06/18/2022  Pt to be I with HEP.  Baseline: Goal status: MET  2.  Pt to report no more than one urinary leakage instance per week for improved QOL.  Baseline:  Goal status: MET  3.  Pt will report 25% reduction of pain due to improvements in posture, strength, and muscle length  Baseline: 7/10 Goal status: MET  4.  Pt to demonstrate improved ability to contract/relax/bulge/relax pelvic floor with minimal cues for improved mobility of tissue and decreased pain. Baseline:  Goal status: MET    LONG TERM GOALS: Target date:  08/21/22    Pt to be I with advanced HEP.  Baseline:  Goal status: INITIAL  2.  Pt to demonstrated improved coordination of pelvic floor and breathing mechanics for decreased pain and strain at pelvic floor at least 75%of the time.  Baseline:  Goal status: INITIAL  3.  Pt to be I with voiding mechanics and breathing mechanics to decrease strain at pelvic floor.  Baseline:  Goal  status: INITIAL  4.  Pt to demonstrate at least 4/5 pelvic floor strength and fully relax post contraction for improved pelvic stability and decreased  strain at pelvic floor/ decrease leakage.  Baseline:  Goal status: INITIAL    PLAN: PT FREQUENCY: 1x/week  PT DURATION:  8 sessions  PLANNED INTERVENTIONS: Therapeutic exercises, Therapeutic activity, Neuromuscular re-education, Patient/Family education, Self Care, Joint mobilization, Aquatic Therapy, Dry Needling, Spinal mobilization, Cryotherapy, Moist heat, scar mobilization, Taping, Biofeedback, and Manual therapy  PLAN FOR NEXT SESSION: manual internal if needed, manual/stretching at hips/core/back/abdomen   Stacy Gardner, PT, DPT 07/15/2311:27 PM   PHYSICAL THERAPY DISCHARGE SUMMARY  Visits from Start of Care: 7  Current functional level related to goals / functional outcomes: Unable to formally reassess as pt has not returned since last appointment or made new appointments    Remaining deficits: Unable to formally reassess    Education / Equipment: HEP   Patient agrees to discharge. Patient goals were partially met. Patient is being discharged due to not returning since the last visit.  Stacy Gardner, PT, DPT 10/22/2310:16 AM

## 2022-07-30 ENCOUNTER — Encounter: Payer: Self-pay | Admitting: Obstetrics and Gynecology

## 2022-07-30 ENCOUNTER — Ambulatory Visit: Payer: Commercial Managed Care - HMO | Admitting: Obstetrics and Gynecology

## 2022-07-30 VITALS — BP 130/81 | HR 64

## 2022-07-30 DIAGNOSIS — R350 Frequency of micturition: Secondary | ICD-10-CM | POA: Diagnosis not present

## 2022-07-30 DIAGNOSIS — N3281 Overactive bladder: Secondary | ICD-10-CM | POA: Diagnosis not present

## 2022-07-30 DIAGNOSIS — M62838 Other muscle spasm: Secondary | ICD-10-CM

## 2022-07-30 LAB — POCT URINALYSIS DIPSTICK
Bilirubin, UA: NEGATIVE
Glucose, UA: NEGATIVE
Ketones, UA: NEGATIVE
Leukocytes, UA: NEGATIVE
Nitrite, UA: NEGATIVE
Protein, UA: NEGATIVE
Spec Grav, UA: 1.015 (ref 1.010–1.025)
Urobilinogen, UA: 0.2 E.U./dL
pH, UA: 6.5 (ref 5.0–8.0)

## 2022-07-30 MED ORDER — SOLIFENACIN SUCCINATE 5 MG PO TABS: 5.0000 mg | ORAL_TABLET | Freq: Every day | ORAL | 5 refills | Status: DC

## 2022-07-30 MED ORDER — CYCLOBENZAPRINE HCL 5 MG PO TABS: 5.0000 mg | ORAL_TABLET | Freq: Three times a day (TID) | ORAL | 2 refills | Status: DC | PRN

## 2022-07-30 NOTE — Progress Notes (Signed)
[facility] Return Visit  SUBJECTIVE  History of Present Illness: Kendra Rodriguez is a 56 y.o. female seen in follow-up for OAB, levator spasm. Plan at last visit was to start physical therapy. She has been to 7 sessions.   Today she has some discomfort with urination. The bladder urgency has improved some. The pressure and pain has improved on the sides but not in the vaginal area- does not feel anything coming from the vagina, just pressure. She tried vaginal valium every night for a month and had a little bit of improvement.   Past Medical History: Patient  has a past medical history of Acute sinusitis, Allergy, Anxiety, Depression, Dizziness, Ear ache, GERD (gastroesophageal reflux disease), Headache(784.0), Leg pain, left, Lumbar stenosis, Peripheral vascular disease ([place]), and Thyroid disease.   Past Surgical History: She  has a past surgical history that includes Cholecystectomy; bladder lift; Vaginal hysterectomy; Lumbar laminectomy/decompression microdiscectomy (Left, 10/24/2014); Back surgery; Esophagogastroduodenoscopy (egd) with propofol (N/A, 07/26/2021); BRAVO ph study (N/A, 07/26/2021); and polypectomy (07/26/2021).   Medications: She has a current medication list which includes the following prescription(s): atorvastatin, cetirizine, cyclobenzaprine, fluticasone, levothyroxine, olopatadine, pantoprazole, solifenacin, and diazepam.   Allergies: Patient has No Known Allergies.   Social History: Patient  reports that she has never smoked. She has never used smokeless tobacco. She reports that she does not drink alcohol and does not use drugs.      OBJECTIVE     Physical Exam: Vitals:   07/30/22 0835  BP: 130/81  Pulse: 64   Gen: No apparent distress, A&O x 3.  Detailed Urogynecologic Evaluation:  Deferred.    ASSESSMENT AND PLAN    [name] is a 56 y.o. with:  1. Overactive bladder   2. Urinary frequency   3. Levator spasm    OAB - Will start  Vesicare 5mg  daily. Has tried Myrbetriq in the past without success - continue with pelvic floor exercises.   2. Levator spasm - Will try an alternative muscle relaxant. Flexeril 5mg  up to three times a day prescribed.  - Also reviewed the option of pelvic floor trigger point injections.   Return 6 weeks for follow up  Jaquita Folds, MD

## 2022-08-25 ENCOUNTER — Encounter: Payer: Self-pay | Admitting: *Deleted

## 2022-09-08 ENCOUNTER — Encounter: Payer: Self-pay | Admitting: Emergency Medicine

## 2022-09-08 ENCOUNTER — Ambulatory Visit (INDEPENDENT_AMBULATORY_CARE_PROVIDER_SITE_OTHER): Payer: Commercial Managed Care - HMO | Admitting: Emergency Medicine

## 2022-09-08 VITALS — BP 134/82 | HR 61 | Temp 98.1°F | Ht 61.5 in | Wt 191.2 lb

## 2022-09-08 DIAGNOSIS — M48062 Spinal stenosis, lumbar region with neurogenic claudication: Secondary | ICD-10-CM

## 2022-09-08 DIAGNOSIS — R7303 Prediabetes: Secondary | ICD-10-CM

## 2022-09-08 DIAGNOSIS — R42 Dizziness and giddiness: Secondary | ICD-10-CM

## 2022-09-08 DIAGNOSIS — E785 Hyperlipidemia, unspecified: Secondary | ICD-10-CM

## 2022-09-08 DIAGNOSIS — E559 Vitamin D deficiency, unspecified: Secondary | ICD-10-CM | POA: Diagnosis not present

## 2022-09-08 DIAGNOSIS — Z8669 Personal history of other diseases of the nervous system and sense organs: Secondary | ICD-10-CM

## 2022-09-08 DIAGNOSIS — Z23 Encounter for immunization: Secondary | ICD-10-CM

## 2022-09-08 DIAGNOSIS — K2100 Gastro-esophageal reflux disease with esophagitis, without bleeding: Secondary | ICD-10-CM

## 2022-09-08 DIAGNOSIS — E039 Hypothyroidism, unspecified: Secondary | ICD-10-CM

## 2022-09-08 DIAGNOSIS — M1712 Unilateral primary osteoarthritis, left knee: Secondary | ICD-10-CM

## 2022-09-08 LAB — LIPID PANEL
Cholesterol: 133 mg/dL (ref 0–200)
HDL: 55.4 mg/dL (ref >39.00)
LDL Cholesterol: 54 mg/dL (ref 0–99)
NonHDL: 77.76
Total CHOL/HDL Ratio: 2
Triglycerides: 118 mg/dL (ref 0.0–149.0)
VLDL: 23.6 mg/dL (ref 0.0–40.0)

## 2022-09-08 LAB — COMPREHENSIVE METABOLIC PANEL
ALT: 11 U/L (ref 0–35)
AST: 14 U/L (ref 0–37)
Albumin: 4.4 g/dL (ref 3.5–5.2)
Alkaline Phosphatase: 89 U/L (ref 39–117)
BUN: 22 mg/dL (ref 6–23)
CO2: 28 mEq/L (ref 19–32)
Calcium: 9.6 mg/dL (ref 8.4–10.5)
Chloride: 102 mEq/L (ref 96–112)
Creatinine, Ser: 0.61 mg/dL (ref 0.40–1.20)
GFR: 99.45 mL/min (ref >60.00)
Glucose, Bld: 113 mg/dL — ABNORMAL HIGH (ref 70–99)
Potassium: 4 mEq/L (ref 3.5–5.1)
Sodium: 137 mEq/L (ref 135–145)
Total Bilirubin: 0.6 mg/dL (ref 0.2–1.2)
Total Protein: 7.7 g/dL (ref 6.0–8.3)

## 2022-09-08 LAB — CBC WITH DIFFERENTIAL/PLATELET
Basophils Absolute: 0 10*3/uL (ref 0.0–0.1)
Basophils Relative: 0.5 % (ref 0.0–3.0)
Eosinophils Absolute: 0 10*3/uL (ref 0.0–0.7)
Eosinophils Relative: 1 % (ref 0.0–5.0)
HCT: 38 % (ref 36.0–46.0)
Hemoglobin: 12.6 g/dL (ref 12.0–15.0)
Lymphocytes Relative: 35.7 % (ref 12.0–46.0)
Lymphs Abs: 1.8 10*3/uL (ref 0.7–4.0)
MCHC: 33.3 g/dL (ref 30.0–36.0)
MCV: 83.2 fl (ref 78.0–100.0)
Monocytes Absolute: 0.4 10*3/uL (ref 0.1–1.0)
Monocytes Relative: 7.6 % (ref 3.0–12.0)
Neutro Abs: 2.8 10*3/uL (ref 1.4–7.7)
Neutrophils Relative %: 55.2 % (ref 43.0–77.0)
Platelets: 260 10*3/uL (ref 150.0–400.0)
RBC: 4.57 Mil/uL (ref 3.87–5.11)
RDW: 13 % (ref 11.5–15.5)
WBC: 5.1 10*3/uL (ref 4.0–10.5)

## 2022-09-08 LAB — HEMOGLOBIN A1C: Hgb A1c MFr Bld: 6.4 % (ref 4.6–6.5)

## 2022-09-08 LAB — TSH: TSH: 1.35 u[IU]/mL (ref 0.35–5.50)

## 2022-09-08 LAB — VITAMIN D 25 HYDROXY (VIT D DEFICIENCY, FRACTURES): VITD: 37.77 ng/mL (ref 30.00–100.00)

## 2022-09-08 NOTE — Assessment & Plan Note (Signed)
Stable.  No recent episodes. 

## 2022-09-08 NOTE — Assessment & Plan Note (Signed)
Progressively getting worse and affecting quality of life. Recent fall due to left knee instability. Needs orthopedic evaluation. Referral to sports medicine placed today.

## 2022-09-08 NOTE — Assessment & Plan Note (Signed)
Stable.  Diet and nutrition discussed. Continue atorvastatin 40 mg daily. The 10-year ASCVD risk score (Arnett DK, et al., 2019) is: 1.8%   Values used to calculate the score:     Age: 56 years     Sex: Female     Is Non-Hispanic African American: No     Diabetic: No     Tobacco smoker: No     Systolic Blood Pressure: 619 mmHg     Is BP treated: No     HDL Cholesterol: 52.9 mg/dL     Total Cholesterol: 130 mg/dL

## 2022-09-08 NOTE — Assessment & Plan Note (Signed)
Clinically euthyroid.  TSH done today. Continue Synthroid 50 mcg daily. 

## 2022-09-08 NOTE — Assessment & Plan Note (Signed)
Stable.  Diet and nutrition discussed. Hemoglobin A1c done today. Advised to decrease amount of daily carbohydrate intake and daily calories and increase amount of plant based protein in her diet

## 2022-09-08 NOTE — Assessment & Plan Note (Signed)
Stable.  Uses Protonix 40 mg daily as needed.

## 2022-09-08 NOTE — Patient Instructions (Signed)

## 2022-09-08 NOTE — Assessment & Plan Note (Signed)
Stable. No complaints.

## 2022-09-08 NOTE — Progress Notes (Signed)
Kendra Rodriguez 56 y.o.   Chief Complaint  Patient presents with   Follow-up    6mnth f/u appt, no concerns     HISTORY OF PRESENT ILLNESS: This is a 56 y.o. female here for 1866-month follow-up of chronic medical problems including the following: 1.  Hypothyroidism 2.  GERD 3.  Prediabetes 4.  Osteoarthritis primarily of left knee with chronic pain to left knee.  It recently locked and she fell.  No injuries. Occasional pain to the right upper quadrant unrelated to food ingestion.  Has history of cholecystectomy.  Pain sometimes lasts 30 minutes without associated symptoms and resolving spontaneously. No other complaints or medical concerns today.  HPI   Prior to Admission medications   Medication Sig Start Date End Date Taking? Authorizing Provider  atorvastatin (LIPITOR) 40 MG tablet TAKE 1 TABLET(40 MG) BY MOUTH DAILY 02/12/22  Yes Alayne Estrella, Eilleen KempfMiguel Jose, MD  cetirizine (ZYRTEC ALLERGY) 10 MG tablet Take 1 tablet (10 mg total) by mouth daily. 04/17/22  Yes Lamptey, Britta MccreedyPhilip O, MD  cyclobenzaprine (FLEXERIL) 5 MG tablet Take 1 tablet (5 mg total) by mouth 3 (three) times daily as needed for muscle spasms. 07/30/22  Yes Marguerita BeardsSchroeder, Michelle N, MD  fluticasone [facility](FLONASE) 50 MCG/ACT nasal spray Place 1 spray into both nostrils daily. 04/17/22  Yes Lamptey, Britta MccreedyPhilip O, MD  levothyroxine (SYNTHROID) 50 MCG tablet TAKE 1 TABLET(50 MCG) BY MOUTH DAILY 03/24/22  Yes Carlus PavlovGherghe, Cristina, MD  olopatadine (PATANOL) 0.1 % ophthalmic solution Place 1 drop into both eyes 2 (two) times daily. 04/17/22  Yes Lamptey, Britta MccreedyPhilip O, MD  pantoprazole (PROTONIX) 40 MG tablet Take 40 mg by mouth daily before breakfast.   Yes [provider]  solifenacin (VESICARE) 5 MG tablet Take 1 tablet (5 mg total) by mouth daily. 07/30/22  Yes Marguerita BeardsSchroeder, Michelle N, MD    No Known Allergies  Patient Active Problem List   Diagnosis Date Noted   Hypothyroidism 11/29/2021   Body mass index (BMI) of 35.0-35.9 in adult  06/04/2020   History of migraine headaches 12/06/2019   Dyslipidemia 06/30/2019   GERD with esophagitis 06/30/2019   Chronic vertigo 04/13/2018   Prediabetes 10/17/2015   Spinal stenosis, lumbar region, with neurogenic claudication 10/24/2014    Past Medical History:  Diagnosis Date   Acute sinusitis    Allergy    Anxiety    Depression    NO MEDICATIONS - DOING OK NOW   Dizziness    Ear ache    RIGHT --HX OF MULTIPLE EAR INFECTIONS AS A CHILD    GERD (gastroesophageal reflux disease)    Headache(784.0)    MIGRAINES   Leg pain, left    with swelling   Lumbar stenosis    PAIN BACK AND LEFT LEG AND NUMBNESS LEG AND FOOT   Peripheral vascular disease (HCC)    HX OF TREATMENT FOR VARICOSE VEINS RIGHT LEG   Thyroid disease    TOLD BLOOD LEVELS SLIGHTLY ABNORMAL - BUT NOT REQUIRED MEDICATION    Past Surgical History:  Procedure Laterality Date   BACK SURGERY     bladder lift     AT SAME TIME OF HYSTERECTOMY   BRAVO PH STUDY N/A 07/26/2021   Procedure: BRAVO PH STUDY;  Surgeon: Jeani HawkingHung, Patrick, MD;  Location: [facility];  Service: Endoscopy;  Laterality: N/A;   CHOLECYSTECTOMY     ESOPHAGOGASTRODUODENOSCOPY (EGD) WITH PROPOFOL N/A 07/26/2021   Procedure: ESOPHAGOGASTRODUODENOSCOPY (EGD) WITH PROPOFOL;  Surgeon: Jeani HawkingHung, Patrick, MD;  Location: [facility];  Service:  Endoscopy;  Laterality: N/A;   LUMBAR LAMINECTOMY/DECOMPRESSION MICRODISCECTOMY Left 10/24/2014   Procedure: COMPLETE LUMBAR DECOMPRESSION L4-L5 CENTRAL AND MICRODISCECTOMY L4-L5 LEFT;  Surgeon: Tobi Bastos, MD;  Location: [facility];  Service: Orthopedics;  Laterality: Left;   POLYPECTOMY  07/26/2021   Procedure: POLYPECTOMY;  Surgeon: Carol Ada, MD;  Location: [facility];  Service: Endoscopy;;   VAGINAL HYSTERECTOMY      Social History   Socioeconomic History   Marital status: Married    Spouse name: Not on file   Number of children: Not on file   Years of education: Not on file   Highest education  level: Not on file  Occupational History   Not on file  Tobacco Use   Smoking status: Never   Smokeless tobacco: Never  Vaping Use   Vaping Use: Never used  Substance and Sexual Activity   Alcohol use: No   Drug use: No   Sexual activity: Yes  Other Topics Concern   Not on file  Social History Narrative   ** Merged History Encounter **       Social Determinants of Health   Financial Resource Strain: Not on file  Food Insecurity: Not on file  Transportation Needs: Not on file  Physical Activity: Not on file  Stress: Not on file  Social Connections: Not on file  Intimate Partner Violence: Not on file    Family History  Problem Relation Age of Onset   Hypertension Mother    Arthritis Mother    Diabetes Mother    Hyperlipidemia Mother    Arthritis Sister    Rheum arthritis Brother    Autoimmune disease Brother    Lung disease Brother    Arthritis Sister    Gout Son    High Cholesterol Daughter    Thyroid disease Neg Hx      Review of Systems  Constitutional: Negative.  Negative for chills and fever.  HENT: Negative.  Negative for congestion and sore throat.   Respiratory: Negative.  Negative for cough and shortness of breath.   Cardiovascular: Negative.  Negative for chest pain and palpitations.  Gastrointestinal:  Positive for abdominal pain (Right upper quadrant). Negative for diarrhea, nausea and vomiting.  Musculoskeletal:  Positive for joint pain (Left knee chronic pain).  Skin: Negative.  Negative for rash.  Neurological: Negative.  Negative for dizziness and headaches.  All other systems reviewed and are negative.  Today's Vitals   09/08/22 0803  BP: 134/82  Pulse: 61  Temp: 98.1 F (36.7 C)  TempSrc: Oral  SpO2: 98%  Weight: 191 lb 4 oz (86.8 kg)  Height: 5' 1.5" (1.562 m)   Body mass index is 35.55 kg/m.   Physical Exam Vitals reviewed.  Constitutional:      Appearance: Normal appearance.  HENT:     Head: Normocephalic.      Mouth/Throat:     Mouth: Mucous membranes are moist.     Pharynx: Oropharynx is clear.  Eyes:     Extraocular Movements: Extraocular movements intact.     Conjunctiva/sclera: Conjunctivae normal.     Pupils: Pupils are equal, round, and reactive to light.  Cardiovascular:     Rate and Rhythm: Normal rate and regular rhythm.     Pulses: Normal pulses.     Heart sounds: Normal heart sounds.  Pulmonary:     Effort: Pulmonary effort is normal.     Breath sounds: Normal breath sounds.  Abdominal:     Palpations: Abdomen is soft.  Tenderness: There is no abdominal tenderness. There is no right CVA tenderness or left CVA tenderness.  Musculoskeletal:     Cervical back: No tenderness.  Lymphadenopathy:     Cervical: No cervical adenopathy.  Skin:    General: Skin is warm and dry.  Neurological:     General: No focal deficit present.     Mental Status: She is alert and oriented to person, place, and time.  Psychiatric:        Mood and Affect: Mood normal.        Behavior: Behavior normal.      ASSESSMENT & PLAN: A total of 45 minutes was spent with the patient and counseling/coordination of care regarding preparing for this visit, review of most recent office visit notes, review of most recent blood work results, review of multiple chronic medical problems and their management, review of all medications, education on nutrition, prognosis, documentation, and need for follow-up  Problem List Items Addressed This Visit       Digestive   GERD with esophagitis    Stable.  Uses Protonix 40 mg daily as needed.      Relevant Orders   Lipase     Endocrine   Hypothyroidism - Primary    Clinically euthyroid.  TSH done today. Continue Synthroid 50 mcg daily.      Relevant Orders   CBC with Differential/Platelet   TSH     Musculoskeletal and Integument   Primary osteoarthritis of left knee    Progressively getting worse and affecting quality of life. Recent fall due to left  knee instability. Needs orthopedic evaluation. Referral to sports medicine placed today.      Relevant Orders   Ambulatory referral to Sports Medicine     Other   Spinal stenosis, lumbar region, with neurogenic claudication    Stable.  No complaints.      Prediabetes    Stable.  Diet and nutrition discussed. Hemoglobin A1c done today. Advised to decrease amount of daily carbohydrate intake and daily calories and increase amount of plant based protein in her diet      Relevant Orders   Comprehensive metabolic panel   Hemoglobin A1c   Chronic vertigo    Stable.  No recent episodes.      Dyslipidemia    Stable.  Diet and nutrition discussed. Continue atorvastatin 40 mg daily. The 10-year ASCVD risk score (Arnett DK, et al., 2019) is: 1.8%   Values used to calculate the score:     Age: 22 years     Sex: Female     Is Non-Hispanic African American: No     Diabetic: No     Tobacco smoker: No     Systolic Blood Pressure: 134 mmHg     Is BP treated: No     HDL Cholesterol: 52.9 mg/dL     Total Cholesterol: 130 mg/dL       Relevant Orders   CBC with Differential/Platelet   Comprehensive metabolic panel   Lipid panel   History of migraine headaches   Vitamin D deficiency   Relevant Orders   VITAMIN D 25 Hydroxy (Vit-D Deficiency, Fractures)   Other Visit Diagnoses     Need for vaccination       Relevant Orders   Zoster Recombinant (Shingrix ) (Completed)        Patient Instructions  Mantenimiento de Radiographer, therapeutic en las mujeres Health Maintenance, Female Adoptar un estilo de vida saludable y recibir atencin preventiva son importantes para  promover la salud y [place]. Consulte al mdico sobre: El esquema adecuado para hacerse pruebas y exmenes peridicos. Cosas que puede hacer por su cuenta para prevenir enfermedades y [place] sano. Qu debo saber sobre la dieta, el peso y el ejercicio? Consuma una dieta saludable  Consuma una dieta que incluya muchas  verduras, frutas, productos lcteos con bajo contenido de [place] y Associate Professor. No consuma muchos alimentos ricos en grasas slidas, azcares agregados o sodio. Mantenga un peso saludable El ndice de masa muscular [facility]) se [place] para identificar problemas de [place]. Proporciona una estimacin de la grasa corporal basndose en el peso y la altura. Su mdico puede ayudarle a Engineer, site [facility] y a Personnel officer o Pharmacologist un peso saludable. Haga ejercicio con regularidad Haga ejercicio con regularidad. Esta es una de las prcticas ms importantes que puede hacer por su salud. La [employer] de los adultos deben seguir estas pautas: Education officer, environmental, al menos, 150 minutos de actividad fsica por semana. El ejercicio debe aumentar la frecuencia cardaca y Media planner transpirar (ejercicio de intensidad moderada). Hacer ejercicios de fortalecimiento por lo [employer] por semana. Agregue esto a su plan de ejercicio de intensidad moderada. Pase menos tiempo sentada. Incluso la actividad fsica ligera puede ser beneficiosa. Controle sus niveles de colesterol y lpidos en la sangre Comience a realizarse anlisis de lpidos y Oncologist en la sangre a los 20 aos y luego reptalos cada 5 aos. Hgase controlar los niveles de colesterol con mayor frecuencia si: Sus niveles de lpidos y colesterol son altos. Es mayor de 40 aos. Presenta un alto riesgo de padecer enfermedades cardacas. Qu debo saber sobre las pruebas de deteccin del cncer? Segn su historia clnica y sus antecedentes familiares, es posible que deba realizarse pruebas de deteccin del cncer en diferentes edades. Esto puede incluir pruebas de deteccin de lo siguiente: Cncer de mama. Cncer de cuello uterino. Cncer colorrectal. Cncer de piel. Cncer de pulmn. Qu debo saber sobre la enfermedad cardaca, la diabetes y la hipertensin arterial? Presin arterial y enfermedad cardaca La hipertensin arterial causa enfermedades cardacas y  [place] el riesgo de accidente cerebrovascular. Es ms probable que esto se manifieste en las personas que tienen lecturas de presin arterial alta o tienen sobrepeso. Hgase controlar la presin arterial: Cada 3 a 5 aos si tiene entre 18 y 84 aos. Todos los aos si es mayor de 40 aos. Diabetes Realcese exmenes de deteccin de la diabetes con regularidad. Este anlisis revisa el nivel de azcar en la sangre en [place]. Hgase las pruebas de deteccin: Cada tres aos despus de los 40 aos de edad si tiene un peso normal y un bajo riesgo de padecer diabetes. Con ms frecuencia y a partir de [place] edad inferior si tiene sobrepeso o un alto riesgo de padecer diabetes. Qu debo saber sobre la prevencin de infecciones? Hepatitis B Si tiene un riesgo ms alto de contraer hepatitis B, debe someterse a un examen de deteccin de este virus. Hable con el mdico para averiguar si tiene riesgo de contraer la infeccin por hepatitis B. Hepatitis C Se recomienda el anlisis a: [employer] 1945 y 1965. Todas las personas que tengan un riesgo de haber contrado hepatitis C. Enfermedades de transmisin sexual (ETS) Hgase las pruebas de Airline pilot de ITS, incluidas la gonorrea y la clamidia, si: Es sexualmente activa y es menor de [address]. Es mayor de [address], y Public affairs consultant informa que corre riesgo de tener este tipo de infecciones. La [place]  sexual ha cambiado desde que le hicieron la ltima prueba de deteccin y tiene un riesgo mayor de Warehouse manager clamidia o Copy. Pregntele al mdico si usted tiene riesgo. Pregntele al mdico si usted tiene un alto riesgo de Primary school teacher VIH. El mdico tambin puede recomendarle un medicamento recetado para ayudar a evitar la infeccin por el VIH. Si elige tomar medicamentos para prevenir el VIH, primero debe [employer] de deteccin del VIH. Luego debe hacerse anlisis cada 3 meses mientras est tomando los medicamentos. Embarazo Si est por  dejar de Armed forces training and education officer (fase premenopusica) y usted puede quedar [place], busque asesoramiento antes de [place]. T[place] de 400 a 800 microgramos (mcg) de cido [employer] si [place]. Pida mtodos de control de la natalidad (anticonceptivos) si desea evitar un embarazo no deseado. Osteoporosis y [place] La osteoporosis es una enfermedad en la que los huesos pierden los minerales y la fuerza por el avance de la edad. El resultado pueden ser fracturas en los [address]. Si tiene 65 aos o ms, o si est en riesgo de sufrir osteoporosis y fracturas, pregunte a su mdico si debe: Hacerse pruebas de deteccin de prdida sea. Tomar un suplemento de calcio o de vitamina D para reducir el riesgo de fracturas. Recibir terapia de reemplazo hormonal (TRH) para tratar los sntomas de la menopausia. Siga estas indicaciones en su casa: Consumo de alcohol No beba alcohol si: Su mdico le indica no hacerlo. Est embarazada, puede estar embarazada o est tratando de [place]. Si bebe alcohol: Limite la cantidad que bebe a lo siguiente: De 0 a 1 bebida por da. Sepa cunta cantidad de alcohol hay en las bebidas que toma. En los [address], una medida equivale a una botella de cerveza de 12 oz (355 ml), un vaso de vino de 5 oz (148 ml) o un vaso de una bebida alcohlica de alta graduacin de 1 oz (44 ml). Estilo de vida No consuma ningn producto que contenga nicotina o tabaco. Estos productos incluyen cigarrillos, tabaco para Theatre manager y aparatos de vapeo, como los Administrator, Civil Service. Si necesita ayuda para dejar de consumir estos productos, consulte al mdico. No consuma drogas. No comparta agujas. Solicite ayuda a su mdico si necesita apoyo o informacin para abandonar las drogas. Indicaciones generales Realcese los estudios de rutina de [address], dentales y de Wellsite geologist. Mantngase al da con las vacunas. Infrmele a su mdico si: Se siente deprimida con  frecuencia. Alguna vez ha sido vctima de [place] o no se siente seguro en su casa. Resumen Adoptar un estilo de vida saludable y recibir atencin preventiva son importantes para promover la salud y Counsellor. Siga las instrucciones del mdico acerca de una dieta saludable, el ejercicio y la realizacin de pruebas o exmenes para Hotel manager. Siga las instrucciones del mdico con respecto al control del colesterol y la presin arterial. Esta informacin no tiene Theme park manager el consejo del mdico. Asegrese de hacerle al mdico cualquier pregunta que tenga. Document Revised: 03/28/2021 Document Reviewed: 03/28/2021 Elsevier Patient Education  2023 Elsevier Inc.   Edwina Barth, MD [facility]

## 2022-09-09 ENCOUNTER — Other Ambulatory Visit: Payer: Self-pay | Admitting: Internal Medicine

## 2022-09-10 ENCOUNTER — Ambulatory Visit: Payer: Commercial Managed Care - HMO | Admitting: Obstetrics and Gynecology

## 2022-09-16 ENCOUNTER — Ambulatory Visit: Payer: Commercial Managed Care - HMO | Admitting: Obstetrics and Gynecology

## 2022-09-19 NOTE — Progress Notes (Unsigned)
   I, Philbert Riser, LAT, ATC acting as a scribe for [name], MD.  Subjective:    CC: L knee pain  HPI: Pt is a 56 y/o female c/o L knee pain x /. Pt locates pain to   L Knee swelling: Mechanical symptoms: Aggravates: Treatments tried:  Dx imaging: 07/22/19 L knee XR  06/30/17 L knee XR  11/23/15 L knee XR  Pertinent review of Systems: ***  Relevant historical information: ***   Objective:   There were no vitals filed for this visit. General: Well Developed, well nourished, and in no acute distress.   MSK: ***  Lab and Radiology Results No results found for this or any previous visit (from the past 72 hour(s)). No results found.    Impression and Recommendations:    Assessment and Plan: 56 y.o. female with ***.  PDMP not reviewed this encounter. No orders of the defined types were placed in this encounter.  No orders of the defined types were placed in this encounter.   Discussed warning signs or symptoms. Please see discharge instructions. Patient expresses understanding.   ***

## 2022-09-22 ENCOUNTER — Ambulatory Visit: Payer: Commercial Managed Care - HMO | Admitting: Family Medicine

## 2022-09-22 ENCOUNTER — Ambulatory Visit: Payer: Self-pay

## 2022-09-22 ENCOUNTER — Ambulatory Visit (INDEPENDENT_AMBULATORY_CARE_PROVIDER_SITE_OTHER): Payer: Commercial Managed Care - HMO

## 2022-09-22 VITALS — BP 122/78 | HR 77 | Ht 61.5 in | Wt 192.0 lb

## 2022-09-22 DIAGNOSIS — M1712 Unilateral primary osteoarthritis, left knee: Secondary | ICD-10-CM | POA: Diagnosis not present

## 2022-09-22 DIAGNOSIS — M5416 Radiculopathy, lumbar region: Secondary | ICD-10-CM

## 2022-09-22 DIAGNOSIS — M25562 Pain in left knee: Secondary | ICD-10-CM

## 2022-09-22 DIAGNOSIS — M62838 Other muscle spasm: Secondary | ICD-10-CM | POA: Diagnosis not present

## 2022-09-22 DIAGNOSIS — G8929 Other chronic pain: Secondary | ICD-10-CM

## 2022-09-22 NOTE — Patient Instructions (Addendum)
Thank you for coming in today.   Please get an Xray today before you leave   You received an injection today. Seek immediate medical attention if the joint becomes red, extremely painful, or is oozing fluid.   I've referred you to Physical Therapy.  Let us know if you don't hear from them in one week.   Return in 1 month.  Schedule spanish interpreter with follow up visit.

## 2022-09-24 NOTE — Progress Notes (Signed)
Left knee x-ray shows medium arthritis.

## 2022-10-22 ENCOUNTER — Ambulatory Visit (INDEPENDENT_AMBULATORY_CARE_PROVIDER_SITE_OTHER): Payer: Commercial Managed Care - HMO | Admitting: Family Medicine

## 2022-10-22 ENCOUNTER — Encounter: Payer: Self-pay | Admitting: Family Medicine

## 2022-10-22 VITALS — BP 122/84 | HR 90 | Temp 97.7°F | Ht 61.5 in | Wt 192.0 lb

## 2022-10-22 DIAGNOSIS — R6883 Chills (without fever): Secondary | ICD-10-CM | POA: Diagnosis not present

## 2022-10-22 DIAGNOSIS — R051 Acute cough: Secondary | ICD-10-CM

## 2022-10-22 DIAGNOSIS — U071 COVID-19: Secondary | ICD-10-CM | POA: Diagnosis not present

## 2022-10-22 DIAGNOSIS — K2100 Gastro-esophageal reflux disease with esophagitis, without bleeding: Secondary | ICD-10-CM

## 2022-10-22 DIAGNOSIS — J029 Acute pharyngitis, unspecified: Secondary | ICD-10-CM

## 2022-10-22 LAB — POC COVID19 BINAXNOW: SARS Coronavirus 2 Ag: POSITIVE — AB

## 2022-10-22 MED ORDER — MOLNUPIRAVIR 200 MG PO CAPS: 4.0000 | ORAL_CAPSULE | Freq: Two times a day (BID) | ORAL | 0 refills | Status: AC

## 2022-10-22 MED ORDER — BENZONATATE 200 MG PO CAPS: 200.0000 mg | ORAL_CAPSULE | Freq: Two times a day (BID) | ORAL | 0 refills | Status: DC | PRN

## 2022-10-22 NOTE — Patient Instructions (Signed)
Your COVID test is positive today.  Start the antiviral medication today and take as prescribed until finished.  I recommend taking Tylenol or ibuprofen over-the-counter for headache, fever, body aches and sore throat.  Take Mucinex DM for cough and congestion. Stay well-hydrated.  I also prescribed Tessalon Perles to take for cough as needed.  Use salt water gargles for sore throat.  You should quarantine until Sunday, December 24.  Then wear your mask for an additional 5 days around people  Let us know if you have any questions or concerns.

## 2022-10-22 NOTE — Progress Notes (Signed)
Subjective:  Kendra Rodriguez is a 56 y.o. female who presents for 3-day history of chills, sore throat, cough and chest congestion.  No dizziness, shortness of breath, abdominal pain, nausea, vomiting or diarrhea.  She had COVID this time last year and got very sick from it.    No other aggravating or relieving factors.  No other c/o.  ROS as in subjective.   Objective: Vitals:   10/22/22 1127  BP: 122/84  Pulse: 90  Temp: 97.7 F (36.5 C)  SpO2: 97%    General appearance: Alert, WD/WN, no distress, mildly ill appearing                                                      Heart: Regular rate                         Lungs: respirations unlabored. Speaking in complete sentences without difficulty.       Assessment: COVID-19 virus infection - Plan: molnupiravir EUA (LAGEVRIO) 200 MG CAPS capsule  Acute cough - Plan: POC COVID-19, benzonatate (TESSALON) 200 MG capsule  Sore throat - Plan: POC COVID-19  Chills - Plan: POC COVID-19  Gastroesophageal reflux disease with esophagitis without hemorrhage   Plan: COVID test positive in office.  Symptom onset 3 days ago.   Molnupiravir prescribed due to medication interaction with Paxlovid.  Counseling on how to take the medication.  Tessalon Perles prescribed per request. Suggested symptomatic OTC remedies.  Tylenol or Ibuprofen OTC for fever and malaise.   Salt water gargles and throat lozenges for sore throat.  Mucinex DM for cough and congestion. Counseling on CDC guidelines for quarantine and isolation. Continue Nexium for GERD Follow-up if worsening or as needed.

## 2022-10-27 ENCOUNTER — Encounter: Payer: Self-pay | Admitting: Family Medicine

## 2022-10-28 NOTE — Telephone Encounter (Signed)
Pt still experiencing productive cough and is wondering if you have any other recommendations/something to prescribe?

## 2022-10-28 NOTE — Progress Notes (Signed)
   I, Philbert Riser, LAT, ATC acting as a scribe for [name], MD.  Kendra Rodriguez is a 56 y.o. female who presents to [facility] at [facility] today for f/u L knee pain. Pt was last seen by Dr. Denyse Amass on 09/22/22 and was given a L knee steroid injection and was referred to PT, but did not schedule any visits. Today, pt reports L knee is feeling good, no pain.   Pt would also like to discuss her LBP ongoing since her surgery in 2015. Pt underwent a bilateral lumbar decompression at L4-5. Pt locates pain to both sides of her low back, L>R, into L buttock, w/ radiating pain along the posterior aspect of the L leg to foot.   Radiating pain: yes LE numbness/tingling: no- "burning sensation" LE weakness: no Aggravates: prolonged sitting, increased activity, forward trunk flexion Treatments tried: diclofenac gel, hot, cold  Dx imaging:  09/22/22 L knee XR  11/27/21 L-spine XR 07/22/19 L knee XR             06/30/17 L knee XR             11/23/15 L knee XR   Pertinent review of systems: No fevers or chills  Relevant historical information: Spinal stenosis .   Exam:  BP 120/80   Pulse 67   Ht 5' 1.5" (1.562 m)   Wt 191 lb (86.6 kg)   SpO2 99%   BMI 35.50 kg/m   General: Well Developed, well nourished, and in no acute distress.   MSK: L-spine: Nontender midline.  Decreased lumbar motion.  Lower extremity strength is intact.  reflexes are intact.    Lab and Radiology Results  X-ray images lumbar spine obtained November 27, 2021 and independently interpreted previously. Degenerative changes L5-S1 with anterior listhesis L4-5.    Assessment and Plan: 56 y.o. female with left lumbar sacral radiculopathy predominantly at S1 perhaps some L5.  Plan for trial of physical therapy.  We reemphasized physical therapy and ordered it again.  I provided her with the phone number.  Her daughter speaks English is going to call and schedule it.  Additionally prescribe some  gabapentin that she can take mostly at bedtime.  Will give this a good trial of physical therapy and check back in about 6 weeks.  If not better would recommend MRI for injection planning.  She agrees with this plan.  Visit conducted using a Spanish interpreter.  PDMP not reviewed this encounter. Orders Placed This Encounter  Procedures   Ambulatory referral to Physical Therapy    Referral Priority:   Routine    Referral Type:   Physical Medicine    Referral Reason:   Specialty Services Required    Requested Specialty:   Physical Therapy   Meds ordered this encounter  Medications   gabapentin (NEURONTIN) 300 MG capsule    Sig: Take 1 capsule (300 mg total) by mouth 3 (three) times daily as needed.    Dispense:  90 capsule    Refill:  3     Discussed warning signs or symptoms. Please see discharge instructions. Patient expresses understanding.   The above documentation has been reviewed and is accurate and complete [name], M.D.

## 2022-10-29 ENCOUNTER — Ambulatory Visit (INDEPENDENT_AMBULATORY_CARE_PROVIDER_SITE_OTHER): Payer: Commercial Managed Care - HMO | Admitting: Family Medicine

## 2022-10-29 ENCOUNTER — Encounter: Payer: Self-pay | Admitting: Family Medicine

## 2022-10-29 ENCOUNTER — Ambulatory Visit: Payer: Commercial Managed Care - HMO | Admitting: Family Medicine

## 2022-10-29 VITALS — BP 120/80 | HR 67 | Ht 61.5 in | Wt 191.0 lb

## 2022-10-29 VITALS — BP 120/80 | HR 67 | Temp 98.3°F | Ht 61.5 in | Wt 191.0 lb

## 2022-10-29 DIAGNOSIS — M79605 Pain in left leg: Secondary | ICD-10-CM

## 2022-10-29 DIAGNOSIS — R058 Other specified cough: Secondary | ICD-10-CM

## 2022-10-29 DIAGNOSIS — M545 Low back pain, unspecified: Secondary | ICD-10-CM | POA: Diagnosis not present

## 2022-10-29 DIAGNOSIS — U071 COVID-19: Secondary | ICD-10-CM | POA: Diagnosis not present

## 2022-10-29 DIAGNOSIS — H1033 Unspecified acute conjunctivitis, bilateral: Secondary | ICD-10-CM | POA: Diagnosis not present

## 2022-10-29 DIAGNOSIS — R051 Acute cough: Secondary | ICD-10-CM

## 2022-10-29 MED ORDER — GABAPENTIN 300 MG PO CAPS: 300.0000 mg | ORAL_CAPSULE | Freq: Three times a day (TID) | ORAL | 3 refills | Status: DC | PRN

## 2022-10-29 MED ORDER — FLUTICASONE PROPIONATE 50 MCG/ACT NA SUSP
1.0000 | Freq: Every day | NASAL | 2 refills | Status: DC
Start: 2022-10-29 — End: 2023-04-09

## 2022-10-29 MED ORDER — POLYMYXIN B-TRIMETHOPRIM 10000-0.1 UNIT/ML-% OP SOLN: 1.0000 [drp] | OPHTHALMIC | 0 refills | Status: DC

## 2022-10-29 MED ORDER — AZITHROMYCIN 250 MG PO TABS: ORAL_TABLET | ORAL | 0 refills | Status: AC

## 2022-10-29 NOTE — Progress Notes (Signed)
Subjective: Her daughter is with her interpreting today. Declines in person medical interpreter.   Kendra Rodriguez is a 56 y.o. female who presents for cough, congestion and eye drainage.  Covid infection last week and cough is more productive and worsening.  She is having green drainage with matting in the morning of both eyes.  Completed course of antiviral drug.   Denies fever, chills, dizziness, chest pain, palpitations, shortness of breath, N/V/D.    No other aggravating or relieving factors.  No other c/o.  ROS as in subjective.   Objective: Vitals:   10/29/22 1122  BP: 120/80  Pulse: 67  Temp: 98.3 F (36.8 C)  SpO2: 99%    General appearance: Alert, WD/WN, no distress, mildly ill appearing                             Skin: warm, no rash                           Head: + sinus tenderness                            Eyes: conjunctiva normal, corneas clear, PERRLA                            Ears: pearly TMs, external ear canals normal                          Nose: septum midline, turbinates swollen, with erythema              Mouth/throat: MMM, tongue normal, mild pharyngeal erythema                           Neck: supple, no adenopathy, no thyromegaly, nontender                          Heart: RRR                         Lungs: CTA bilaterally, no wheezes, rales, or rhonchi      Assessment: Productive cough - Plan: azithromycin (ZITHROMAX) 250 MG tablet  COVID-19 virus infection  Acute cough  Acute bacterial conjunctivitis of both eyes - Plan: trimethoprim-polymyxin b (POLYTRIM) ophthalmic solution   Plan: Azithromycin and Polytrim prescribed.  Suggested symptomatic OTC remedies. Nasal saline spray for congestion.  Tylenol or Ibuprofen OTC as needed.   Follow-up as needed

## 2022-10-29 NOTE — Patient Instructions (Signed)
Take the antibiotic as prescribed.   Use the eye drops as prescribed.   Continue taking allergy medication and use Flonase nasal spray.   Ok to take Mucinex for 2-3 more days. Stay well hydrated.   Follow up if you are getting worse or not improving in the next week.

## 2022-10-29 NOTE — Patient Instructions (Signed)
Thank you for coming in today.   I've referred you to Physical Therapy.  Let us know if you don't hear from them in one week.   Try gabapentin at bedtime as needed.   Return in 6 weeks.   Let me know if you have a problem.

## 2022-11-17 ENCOUNTER — Encounter: Payer: Self-pay | Admitting: Physical Therapy

## 2022-11-17 ENCOUNTER — Ambulatory Visit: Payer: Commercial Managed Care - HMO | Attending: Family Medicine | Admitting: Physical Therapy

## 2022-11-17 ENCOUNTER — Other Ambulatory Visit: Payer: Self-pay

## 2022-11-17 DIAGNOSIS — M542 Cervicalgia: Secondary | ICD-10-CM | POA: Diagnosis present

## 2022-11-17 DIAGNOSIS — G8929 Other chronic pain: Secondary | ICD-10-CM | POA: Insufficient documentation

## 2022-11-17 DIAGNOSIS — M5416 Radiculopathy, lumbar region: Secondary | ICD-10-CM | POA: Insufficient documentation

## 2022-11-17 DIAGNOSIS — M6281 Muscle weakness (generalized): Secondary | ICD-10-CM | POA: Insufficient documentation

## 2022-11-17 DIAGNOSIS — M545 Low back pain, unspecified: Secondary | ICD-10-CM | POA: Diagnosis present

## 2022-11-17 DIAGNOSIS — M25511 Pain in right shoulder: Secondary | ICD-10-CM | POA: Diagnosis present

## 2022-11-17 DIAGNOSIS — R293 Abnormal posture: Secondary | ICD-10-CM | POA: Diagnosis present

## 2022-11-17 DIAGNOSIS — M25512 Pain in left shoulder: Secondary | ICD-10-CM | POA: Insufficient documentation

## 2022-11-17 DIAGNOSIS — R279 Unspecified lack of coordination: Secondary | ICD-10-CM | POA: Insufficient documentation

## 2022-11-17 DIAGNOSIS — M62838 Other muscle spasm: Secondary | ICD-10-CM | POA: Diagnosis present

## 2022-11-17 NOTE — Patient Instructions (Signed)
Aquatic Therapy at [facility]-  What to Expect! ? ?Where:  ? ?[place] ?[facility] ?[address] [facility] ?[place], [place] [address] ?Rehab phone [phone] ? ?NOTE:  You will receive an automated phone message reminding you of your appt and it will say the appointment is at the [address] [facility] [address] [facility] clinic. ? ?        ?How to Prepare: ?Please make sure you drink 8 ounces of water about one hour prior to your pool session ?A caregiver may attend if needed with the patient to help assist as needed. A caregiver can sit in the pool room on chair. ?Please arrive IN YOUR SUIT and 15 minutes prior to your appointment - this helps to avoid delays in starting your session. ?Please make sure to attend to any toileting needs prior to entering the pool ?Locker rooms for changing are provided.   There is direct access to the pool deck form the locker room.  You can lock your belongings in a locker with lock provided. ?Once on the pool deck your therapist will ask if you have signed the Patient  Consent and Assignment of Benefits form before beginning treatment ?Your therapist may take your blood pressure prior to, during and after your session if indicated ?We usually try and create a home exercise program based on activities we do in the pool.  Please be thinking about who might be able to assist you in the pool should you need to participate in an aquatic home exercise program at the time of discharge if you need assistance.  Some patients do not want to or do not have the ability to participate in an aquatic home program - this is not a barrier in any way to you participating in aquatic therapy as part of your current therapy plan! ?After Discharge from PT, you can continue using home program at  the [facility]/, there is a drop-in fee for $5 ($45 a month)or for 60 years  or older $4.00 ($40 a month for seniors ) or any local [employer] pool.  Memberships for purchase are  available for gym/pool at [facility] ? ?IT IS VERY IMPORTANT THAT YOUR LAST VISIT BE IN THE CLINIC AT [address] AFTER YOUR LAST AQUATIC VISIT.  PLEASE MAKE SURE THAT YOU HAVE A [address]  APPOINTMENT SCHEDULED. ? ? ?About the pool: ?Pool is located approximately 500 FT from the entrance of the building.  ?Please bring a support person if you need assistance traveling this      distance.  ? ?Your therapist will assist you in entering the water; there are two ways to     ?      enter: stairs with railings, and a mechanical lift. Your therapist will determine the most appropriate way for you. ? ?Water temperature is usually between 88-90 degrees ? ?There may be up to 2 other swimmers in the pool at the same time ? ?The pool deck is tile, please wear shoes with good traction if you prefer not to be barefoot.  ? ? ?Contact Info:  ?For appointment scheduling and cancellations:         Please call the [facility]  PH:[phone]              Aquatic Therapy  ?[facility]       All sessions are 45 minutes ? ? ? ? ?        ? ?                ?                      ?

## 2022-11-17 NOTE — Therapy (Signed)
OUTPATIENT PHYSICAL THERAPY THORACOLUMBAR EVALUATION   Patient Name: Kendra Rodriguez MRN: [id number] DOB:[birth year], 57 y.o., female Today's Date: 11/17/2022  END OF SESSION:  PT End of Session - 11/17/22 1050     Visit Number 1    Number of Visits 17    Date for PT Re-Evaluation 01/12/23    Authorization Type [employer]    PT Start Time [phone]    PT Stop Time 1030    PT Time Calculation (min) 59 min    Activity Tolerance Patient tolerated treatment well             Past Medical History:  Diagnosis Date   Acute sinusitis    Allergy    Anxiety    Depression    NO MEDICATIONS - DOING OK NOW   Dizziness    Ear ache    RIGHT --HX OF MULTIPLE EAR INFECTIONS AS A CHILD    GERD (gastroesophageal reflux disease)    Headache(784.0)    MIGRAINES   Leg pain, left    with swelling   Lumbar stenosis    PAIN BACK AND LEFT LEG AND NUMBNESS LEG AND FOOT   Peripheral vascular disease (HCC)    HX OF TREATMENT FOR VARICOSE VEINS RIGHT LEG   Thyroid disease    TOLD BLOOD LEVELS SLIGHTLY ABNORMAL - BUT NOT REQUIRED MEDICATION   Past Surgical History:  Procedure Laterality Date   BACK SURGERY     bladder lift     AT SAME TIME OF HYSTERECTOMY   BRAVO PH STUDY N/A 07/26/2021   Procedure: BRAVO PH STUDY;  Surgeon: Jeani Hawking, MD;  Location: [facility];  Service: Endoscopy;  Laterality: N/A;   CHOLECYSTECTOMY     ESOPHAGOGASTRODUODENOSCOPY (EGD) WITH PROPOFOL N/A 07/26/2021   Procedure: ESOPHAGOGASTRODUODENOSCOPY (EGD) WITH PROPOFOL;  Surgeon: Jeani Hawking, MD;  Location: [facility];  Service: Endoscopy;  Laterality: N/A;   LUMBAR LAMINECTOMY/DECOMPRESSION MICRODISCECTOMY Left 10/24/2014   Procedure: COMPLETE LUMBAR DECOMPRESSION L4-L5 CENTRAL AND MICRODISCECTOMY L4-L5 LEFT;  Surgeon: Jacki Cones, MD;  Location: [facility];  Service: Orthopedics;  Laterality: Left;   POLYPECTOMY  07/26/2021   Procedure: POLYPECTOMY;  Surgeon: Jeani Hawking, MD;  Location: [facility];  Service:  Endoscopy;;   VAGINAL HYSTERECTOMY     Patient Active Problem List   Diagnosis Date Noted   Primary osteoarthritis of left knee 09/08/2022   Vitamin D deficiency 09/08/2022   Hypothyroidism 11/29/2021   Body mass index (BMI) of 35.0-35.9 in adult 06/04/2020   History of migraine headaches 12/06/2019   Dyslipidemia 06/30/2019   GERD with esophagitis 06/30/2019   Chronic vertigo 04/13/2018   Prediabetes 10/17/2015   Spinal stenosis, lumbar region, with neurogenic claudication 10/24/2014    PCP: [name], MD   REFERRING PROVIDER: Rodolph Bong, MD   REFERRING DIAG: Lumbar radiculopathy, right [M54.16], Trapezius muscle spasm [M62.838]   Rationale for Evaluation and Treatment: Rehabilitation  THERAPY DIAG:  Radiculopathy, lumbar region  Abnormal posture  Muscle weakness (generalized)  Cervicalgia  ONSET DATE: lumbar Radiculopathy 3 months, R upper trap chronically for over 5 years  SUBJECTIVE:  SUBJECTIVE STATEMENT: Pt arrives to PT today with report of low back pain with referred pain down the Rleg and goes into the foot and all the toes that started about 3 months ago with no specific MOI she is able to recall.  that is worse with prolonged sitting. She also reports pain with cooking/ cleaning. She reports about 2 months ago she noticed having some increase in pressure in the pelvic region and has noted seeing a pelvic floor PT but otherwise reports no other / new bowel/ bladder incontinence or saddle paresthesias.  She reports having increased pain in the R shoulder that occurs with lifting the arm over head which occurs on the side of the neck. She notes that her MD believes the neck is potentially a cause of her low back pain.  PERTINENT HISTORY:  Hx of pre-diabetes.  PAIN:   Arm/ neck 6/10 worse with lifting/ carrying items in RUE  Better with rest.   Are you having pain? Yes: NPRS scale: 6/10 Pain location: low back, RLE  Pain description: pain, camping.  Aggravating factors: prolonged sitting, walking, cold  Relieving factors: getting up and moving  PRECAUTIONS: None  WEIGHT BEARING RESTRICTIONS: No  FALLS:  Has patient fallen in last 6 months? No  LIVING ENVIRONMENT: Lives with: lives with their family Lives in: House/apartment    OCCUPATION: Unemployed   PLOF: Independent with basic ADLs  PATIENT GOALS: decrease pain.  NEXT MD VISIT:   OBJECTIVE:   DIAGNOSTIC FINDINGS:  N/A  PATIENT SURVEYS:  FOTO 54% predicted 66%  SCREENING FOR RED FLAGS: Bowel or bladder incontinence: No   COGNITION: Overall cognitive status: Within functional limits for tasks assessed     SENSATION: Altered sensation on the RLE L1 & L5    POSTURE: rounded shoulders and forward head upper trap dominance noted throughout UE MMT/ ROM assessment  PALPATION: Neck - TTP along the R upper trap/ levator scapulae with palpable trigger pointed reproducing concordant pain  Back- TTP along the R thoracolumbar paraspinals with trigger points noted.    CERVICAL ROM:   Active ROM A/PROM (deg) Eval  Flexion WFL P! PDM  Extension WFL P! ERP  Right lateral flexion WFL  Left lateral flexion WFL P! ERP  Right rotation WFL P! ERP  Left rotation WFL   (Blank rows = not tested)  UE ROM:  Active ROM Right Eval Left Carley Hammed;  Shoulder flexion WFL * WFL  Shoulder extension [facility]  Shoulder abduction WFL * WFL  Shoulder adduction    Shoulder extension [facility]  Shoulder internal rotation    Shoulder external rotation    Elbow flexion    Elbow extension    Wrist flexion    Wrist extension    Wrist ulnar deviation    Wrist radial deviation    Wrist pronation    Wrist supination     (Blank rows = not tested) (pain during movement = *)  UE MMT:  MMT  Right Eval Left Eval  Shoulder flexion 4-/5 P! 4-/5  Shoulder extension 4/5 P! 4/5  Shoulder abduction 4-/5 P! 4-/5  Shoulder adduction    Shoulder internal rotation 4-/5 4/5  Shoulder external rotation 4-/5 4/5  Middle trapezius    Lower trapezius    Elbow flexion    Elbow extension    Wrist flexion    Wrist extension    Wrist ulnar deviation    Wrist radial deviation    Wrist pronation    Wrist supination  Grip strength     (Blank rows = not tested)   LUMBAR ROM:   AROM eval  Flexion 50  Extension 10  Right lateral flexion 20  Left lateral flexion 20 *  Right rotation   Left rotation    (Blank rows = not tested) *Rlateral side bend reproduced concordant symptoms     LOWER EXTREMITY ROM:     Active  Right eval Left eval  Hip flexion    Hip extension    Hip abduction    Hip adduction    Hip internal rotation    Hip external rotation    Knee flexion    Knee extension    Ankle dorsiflexion    Ankle plantarflexion    Ankle inversion    Ankle eversion     (Blank rows = not tested)  LOWER EXTREMITY MMT:    MMT Right eval Left eval  Hip flexion 3+/5 * 4/5  Hip extension    Hip abduction 3+/5 3+/5  Hip adduction 4/5 4+/5  Hip internal rotation    Hip external rotation    Knee flexion    Knee extension    Ankle dorsiflexion    Ankle plantarflexion    Ankle inversion    Ankle eversion     (Blank rows = not tested) (*= pain during testing)  LUMBAR SPECIAL TESTS:     Repeated extension in standing increased RLE pain Repated felxion in standing no worse in pain.   FUNCTIONAL TESTS:  5 times sit to stand: 13 sec  GAIT: Distance walked: 120 ft from waiting room to exam room Assistive device utilized: None Level of assistance: Complete Independence Comments: No abnormal movements noted  TODAY'S TREATMENT:                                                                                                                              [facility] Adult  PT Treatment:                                                DATE: EVAL Therapeutic Exercise: Low back stretch seated in chair 1 x 30 sec Seated piriformis stretch R LE 1 x 30 sec R upper trap stretch 2 x 30 sec Self Care: Effect of dx of stenosis and benefits of flexion based exercises and benefits of using proper posture to reduce tension. Effects of upper trap dominance and stretching of the R upper trap and reducing hiking the R shoulder.     PATIENT EDUCATION:  Education details: evaluation findings, POC, goals, HEP with proper form/ rationale. Person educated: Patient Education method: Explanation, Verbal cues, and Handouts Education comprehension: verbalized understanding  HOME EXERCISE PROGRAM: Access Code: [email or web address] URL: [email or web address] Date: 11/17/2022 Prepared by: Lulu Riding  Exercises - Seated Upper Trapezius Stretch  - 3 x daily -  7 x weekly - 2 sets - 2 reps - 30 seconds hold - Gentle Levator Scapulae Stretch (Mirrored)  - 1 x daily - 7 x weekly - 2 sets - 2 reps - 30 seconds hold - Seated Flexion Stretch  - 1 x daily - 7 x weekly - 2 sets - 10 reps - Seated Single Knee to Chest  - 1 x daily - 7 x weekly - 2 sets - 30 sec hold - Hooklying Single Knee to Chest  - 1 x daily - 7 x weekly - 2 sets - 10 reps - Seated Piriformis Stretch  - 1 x daily - 7 x weekly - 2 sets - 10 reps - Supine March  - 1 x daily - 7 x weekly - 2 sets - 10 reps - 5 hold  ASSESSMENT:  CLINICAL IMPRESSION: Patient is a 57 y.o. F who was seen today for physical therapy evaluation and treatment for R upper trap spam which she has been seen for previously at this locationand R lumbar radiculopathy that has been worsening for the last 3 months. She demonstrates functional cervical mobility with pain during movement with R rotation, L sidebending and flexion, TTP noted along the R upper trap/ levator scapulae. MMT revealed gross weakness in bil UE with pain noted in RUE during  flexion and abduction. She has funcitonal trunk mobiltiy with noted RLE referred symptoms worse with extension and R sidebending, and demonstrates weakness in the RLE compared bil secondary to pain during assessment. Special testing and dermatome sensation assessment is consistent with MD dx of stenosis in the low back and spasm noted in the R upper trap. Provided flexion biased exercises for the low back which she noted relief of pain during session. She would benefit from physical therapy to decrease low back pain, reduce upper trap tension, increase gross UE/ LE and core strength and maximize her function by addressing the deficits listed.   OBJECTIVE IMPAIRMENTS: decreased activity tolerance, decreased endurance, decreased ROM, decreased strength, hypomobility, impaired sensation, postural dysfunction, and pain.   ACTIVITY LIMITATIONS: carrying, lifting, bending, sitting, standing, and squatting  PARTICIPATION LIMITATIONS: meal prep, cleaning, laundry, shopping, community activity, and yard work  PERSONAL FACTORS: Past/current experiences and 1 comorbidity: Pre-diabetes  are also affecting patient's functional outcome.   REHAB POTENTIAL: Good  CLINICAL DECISION MAKING: Evolving/moderate complexity  EVALUATION COMPLEXITY: Moderate   GOALS: Goals reviewed with patient? Yes  SHORT TERM GOALS: Target date: 12/15/2022    Pt to be IND with initial HEP Baseline: Goal status: INITIAL  2.  Pt to report decreased RLE referred symptoms  and RLE cramping to </= 4 x per week to demo improvement in condition Baseline:  Goal status: INITIAL  3.  Pt be able to perform house hold activities with report of </=min difficulty per FOTO assessment to demo improving function Baseline:  Goal status: INITIAL  4.  Pt to report reduce tension in the R upper trap and report pain reduction to </= 4/10  Baseline:  Goal status: INITIAL  LONG TERM GOALS: Target date: 01/12/2023    Increase RUE gross  strength to >/= 4/5 to promote scapulohumeral rhythm and reduce upper trap dominance.  Baseline:  Goal status: INITIAL  2.  Increase RLE strength to >/= 4/5 to promote stability with standing/ walking and reduce tension in the low back Baseline:  Goal status: INITIAL  3.  Pt to report RLE referred pain and spams to </=4 x in 2 week time frame for improvement  in condition. Baseline:  Goal status: INITIAL  4.  Increase FOTO score to 66% to demo improvement in function Baseline:  Goal status: INITIAL  5.  Pt to be able to perform cervical and RUE ROM required for ADLs with </= 3/10 max  Baseline:  Goal status: INITIAL  6.  Pt to be IND with all HEP to be able to maintain and progress current LOF Baseline:  Goal status: INITIAL  PLAN:  PT FREQUENCY: 1-2x/week  PT DURATION: 8 weeks  PLANNED INTERVENTIONS: Therapeutic exercises, Therapeutic activity, Neuromuscular re-education, Balance training, Gait training, Patient/Family education, Self Care, Joint mobilization, Joint manipulation, Stair training, Aquatic Therapy, Dry Needling, Electrical stimulation, Spinal manipulation, Spinal mobilization, Cryotherapy, Moist heat, Taping, Traction, Ultrasound, Ionotophoresis 4mg /ml Dexamethasone, Manual therapy, and Re-evaluation.  PLAN FOR NEXT SESSION: Review/ update HEP PRN. Flexion biased exercises for back, core strengthening, gross UE/ LE strengthening. STW consider TPDN for R upper trap/ levator and R thoracolumbar paraspinals. Trial manual traction if tolerated well then follow with mechanical traction.    Jedd Schulenburg PT, DPT, LAT, ATC  11/17/22  10:52 AM

## 2022-12-01 ENCOUNTER — Encounter: Payer: Self-pay | Admitting: Physical Therapy

## 2022-12-01 ENCOUNTER — Ambulatory Visit: Payer: Commercial Managed Care - HMO | Admitting: Physical Therapy

## 2022-12-01 DIAGNOSIS — M5416 Radiculopathy, lumbar region: Secondary | ICD-10-CM

## 2022-12-01 DIAGNOSIS — R293 Abnormal posture: Secondary | ICD-10-CM

## 2022-12-01 DIAGNOSIS — M6281 Muscle weakness (generalized): Secondary | ICD-10-CM

## 2022-12-01 NOTE — Therapy (Signed)
OUTPATIENT PHYSICAL THERAPY TREATMENT   Patient Name: Kendra Rodriguez MRN: [id number] DOB:[birth year], 57 y.o., female Today's Date: 12/01/2022  END OF SESSION:  PT End of Session - 12/01/22 0937     Visit Number 2    Number of Visits 17    Date for PT Re-Evaluation 01/12/23    Authorization Type [employer]    PT Start Time [phone]    PT Stop Time 1018    PT Time Calculation (min) 44 min    Activity Tolerance Patient tolerated treatment well    Behavior During Therapy WFL for tasks assessed/performed              Past Medical History:  Diagnosis Date   Acute sinusitis    Allergy    Anxiety    Depression    NO MEDICATIONS - DOING OK NOW   Dizziness    Ear ache    RIGHT --HX OF MULTIPLE EAR INFECTIONS AS A CHILD    GERD (gastroesophageal reflux disease)    Headache(784.0)    MIGRAINES   Leg pain, left    with swelling   Lumbar stenosis    PAIN BACK AND LEFT LEG AND NUMBNESS LEG AND FOOT   Peripheral vascular disease (HCC)    HX OF TREATMENT FOR VARICOSE VEINS RIGHT LEG   Thyroid disease    TOLD BLOOD LEVELS SLIGHTLY ABNORMAL - BUT NOT REQUIRED MEDICATION   Past Surgical History:  Procedure Laterality Date   BACK SURGERY     bladder lift     AT SAME TIME OF HYSTERECTOMY   BRAVO [place] STUDY N/A 07/26/2021   Procedure: BRAVO [place] STUDY;  Surgeon: Carol Ada, MD;  Location: [facility];  Service: Endoscopy;  Laterality: N/A;   CHOLECYSTECTOMY     ESOPHAGOGASTRODUODENOSCOPY (EGD) WITH PROPOFOL N/A 07/26/2021   Procedure: ESOPHAGOGASTRODUODENOSCOPY (EGD) WITH PROPOFOL;  Surgeon: Carol Ada, MD;  Location: [facility];  Service: Endoscopy;  Laterality: N/A;   LUMBAR LAMINECTOMY/DECOMPRESSION MICRODISCECTOMY Left 10/24/2014   Procedure: COMPLETE LUMBAR DECOMPRESSION L4-L5 CENTRAL AND MICRODISCECTOMY L4-L5 LEFT;  Surgeon: Tobi Bastos, MD;  Location: [facility];  Service: Orthopedics;  Laterality: Left;   POLYPECTOMY  07/26/2021   Procedure: POLYPECTOMY;  Surgeon: Carol Ada, MD;  Location: [facility];  Service: Endoscopy;;   VAGINAL HYSTERECTOMY     Patient Active Problem List   Diagnosis Date Noted   Primary osteoarthritis of left knee 09/08/2022   Vitamin D deficiency 09/08/2022   Hypothyroidism 11/29/2021   Body mass index (BMI) of 35.0-35.9 in adult 06/04/2020   History of migraine headaches 12/06/2019   Dyslipidemia 06/30/2019   GERD with esophagitis 06/30/2019   Chronic vertigo 04/13/2018   Prediabetes 10/17/2015   Spinal stenosis, lumbar region, with neurogenic claudication 10/24/2014    PCP: Horald Pollen, MD   REFERRING PROVIDER: Gregor Hams, MD   REFERRING DIAG: Lumbar radiculopathy, right [M54.16], Trapezius muscle spasm [M62.838]   Rationale for Evaluation and Treatment: Rehabilitation  THERAPY DIAG:  Radiculopathy, lumbar region  Abnormal posture  Muscle weakness (generalized)  ONSET DATE: lumbar Radiculopathy 3 months, R upper trap chronically for over 5 years  SUBJECTIVE:  SUBJECTIVE STATEMENT: "I've been having that low back pain yesterday was worst and it was going down the right leg. The shoulder has been bothering me as well. I did a lot of cooking/ cleaning."  PERTINENT HISTORY:  Hx of pre-diabetes.  PAIN:  Arm/ neck 6/10 worse with lifting/ carrying items in RUE  Better with rest.   Are you having pain? Yes: NPRS scale: 6/10 Pain location: low back, RLE  Pain description: pain, camping.  Aggravating factors: prolonged sitting, walking, cold  Relieving factors: getting up and moving  PRECAUTIONS: None  WEIGHT BEARING RESTRICTIONS: No  FALLS:  Has patient fallen in last 6 months? No  LIVING ENVIRONMENT: Lives with: lives with their family Lives in: House/apartment    OCCUPATION: Unemployed   PLOF:  Independent with basic ADLs  PATIENT GOALS: decrease pain.  NEXT MD VISIT:   OBJECTIVE:   DIAGNOSTIC FINDINGS:  N/A  PATIENT SURVEYS:  FOTO 54% predicted 66%  SCREENING FOR RED FLAGS: Bowel or bladder incontinence: No   COGNITION: Overall cognitive status: Within functional limits for tasks assessed     SENSATION: Altered sensation on the RLE L1 & L5    POSTURE: rounded shoulders and forward head upper trap dominance noted throughout UE MMT/ ROM assessment  PALPATION: Neck - TTP along the R upper trap/ levator scapulae with palpable trigger pointed reproducing concordant pain  Back- TTP along the R thoracolumbar paraspinals with trigger points noted.    CERVICAL ROM:   Active ROM A/PROM (deg) Eval  Flexion WFL P! PDM  Extension WFL P! ERP  Right lateral flexion WFL  Left lateral flexion WFL P! ERP  Right rotation WFL P! ERP  Left rotation WFL   (Blank rows = not tested)  UE ROM:  Active ROM Right Eval Left [place];  Shoulder flexion WFL * WFL  Shoulder extension [facility]  Shoulder abduction WFL * WFL  Shoulder adduction    Shoulder extension [facility]  Shoulder internal rotation    Shoulder external rotation    Elbow flexion    Elbow extension    Wrist flexion    Wrist extension    Wrist ulnar deviation    Wrist radial deviation    Wrist pronation    Wrist supination     (Blank rows = not tested) (pain during movement = *)  UE MMT:  MMT Right Eval Left Eval  Shoulder flexion 4-/5 P! 4-/5  Shoulder extension 4/5 P! 4/5  Shoulder abduction 4-/5 P! 4-/5  Shoulder adduction    Shoulder internal rotation 4-/5 4/5  Shoulder external rotation 4-/5 4/5  Middle trapezius    Lower trapezius    Elbow flexion    Elbow extension    Wrist flexion    Wrist extension    Wrist ulnar deviation    Wrist radial deviation    Wrist pronation    Wrist supination    Grip strength     (Blank rows = not tested)   LUMBAR ROM:   AROM eval  Flexion 50   Extension 10  Right lateral flexion 20  Left lateral flexion 20 *  Right rotation   Left rotation    (Blank rows = not tested) *Rlateral side bend reproduced concordant symptoms     LOWER EXTREMITY ROM:     Active  Right eval Left eval  Hip flexion    Hip extension    Hip abduction    Hip adduction    Hip internal rotation  Hip external rotation    Knee flexion    Knee extension    Ankle dorsiflexion    Ankle plantarflexion    Ankle inversion    Ankle eversion     (Blank rows = not tested)  LOWER EXTREMITY MMT:    MMT Right eval Left eval  Hip flexion 3+/5 * 4/5  Hip extension    Hip abduction 3+/5 3+/5  Hip adduction 4/5 4+/5  Hip internal rotation    Hip external rotation    Knee flexion    Knee extension    Ankle dorsiflexion    Ankle plantarflexion    Ankle inversion    Ankle eversion     (Blank rows = not tested) (*= pain during testing)  LUMBAR SPECIAL TESTS:     Repeated extension in standing increased RLE pain Repated felxion in standing no worse in pain.   FUNCTIONAL TESTS:  5 times sit to stand: 13 sec  GAIT: Distance walked: 120 ft from waiting room to exam room Assistive device utilized: None Level of assistance: Complete Independence Comments: No abnormal movements noted  TODAY'S TREATMENT:                                                                                                                               [facility] Adult PT Treatment:                                                DATE: 12/01/2022 Therapeutic Exercise: Ardine Eng pose 2 x 30 sec (demonstration for cues) Cat/ cow exercise 2 x 10 holding each position 3 seconds (demonstation and verbal cues for proper technique) Posterior pelvic tilt in supine 2 x 10 holding 5 seconds ea (noted some soreness in the R low back but reports it eases with reps) Supine marching 2 x 20 Manual Therapy: MPTR along the bil thoracolumbar paraspinals Manual lumbar traction in  supine Self-care Reviewed posture in standing to avoid leaning backward or resting her hips on the counter resting on her "Y" ligaments and utilize a step to put one foot on and staying in slightly flexed position. Additionally to take breaks to allow for her body to recover.   Updated HEP today for childs pose and cat/ cow exercise   [facility] Adult PT Treatment:                                                DATE: EVAL Therapeutic Exercise: Low back stretch seated in chair 1 x 30 sec Seated piriformis stretch R LE 1 x 30 sec R upper trap stretch 2 x 30 sec Self Care: Effect of dx of stenosis and benefits of flexion based  exercises and benefits of using proper posture to reduce tension. Effects of upper trap dominance and stretching of the R upper trap and reducing hiking the R shoulder.  PATIENT EDUCATION:  Education details: evaluation findings, POC, goals, HEP with proper form/ rationale. Person educated: Patient Education method: Explanation, Verbal cues, and Handouts Education comprehension: verbalized understanding  HOME EXERCISE PROGRAM: Access Code: [email or web address] URL: [email or web address] Date: 12/01/2022 Prepared by: Starr Lake  Exercises - Seated Upper Trapezius Stretch  - 3 x daily - 7 x weekly - 2 sets - 2 reps - 30 seconds hold - Gentle Levator Scapulae Stretch (Mirrored)  - 1 x daily - 7 x weekly - 2 sets - 2 reps - 30 seconds hold - Seated Flexion Stretch  - 1 x daily - 7 x weekly - 2 sets - 10 reps - Seated Single Knee to Chest  - 1 x daily - 7 x weekly - 2 sets - 30 sec hold - Hooklying Single Knee to Chest  - 1 x daily - 7 x weekly - 2 sets - 10 reps - Seated Piriformis Stretch  - 1 x daily - 7 x weekly - 2 sets - 10 reps - Supine March  - 1 x daily - 7 x weekly - 2 sets - 10 reps - 5 hold - Child's Pose Stretch  - 2 x daily - 7 x weekly - 2 sets - 2 reps - 30 hold - Cat Cow to Child's Pose  - 1 x daily - 7 x weekly - 2 sets - 10  reps  ASSESSMENT:  CLINICAL IMPRESSION: Pt arrives to PT today noting increased low back/ R upper trap with concordant RLE referred symptoms. Utilized manual trigger point release techniques. Followed with manual traction and flexion biased exercises. Pt reported the RLE referred symptoms completely resolved and pain in the back dropped from a 6/10 to a 3/10. Reviewed posture and effects of maintaining a sway back position when cleaning/ cooking providing alternatives.   OBJECTIVE IMPAIRMENTS: decreased activity tolerance, decreased endurance, decreased ROM, decreased strength, hypomobility, impaired sensation, postural dysfunction, and pain.   ACTIVITY LIMITATIONS: carrying, lifting, bending, sitting, standing, and squatting  PARTICIPATION LIMITATIONS: meal prep, cleaning, laundry, shopping, community activity, and yard work  PERSONAL FACTORS: Past/current experiences and 1 comorbidity: Pre-diabetes  are also affecting patient's functional outcome.   REHAB POTENTIAL: Good  CLINICAL DECISION MAKING: Evolving/moderate complexity  EVALUATION COMPLEXITY: Moderate   GOALS: Goals reviewed with patient? Yes  SHORT TERM GOALS: Target date: 12/15/2022    Pt to be IND with initial HEP Baseline: Goal status: INITIAL  2.  Pt to report decreased RLE referred symptoms  and RLE cramping to </= 4 x per week to demo improvement in condition Baseline:  Goal status: INITIAL  3.  Pt be able to perform house hold activities with report of </=min difficulty per FOTO assessment to demo improving function Baseline:  Goal status: INITIAL  4.  Pt to report reduce tension in the R upper trap and report pain reduction to </= 4/10  Baseline:  Goal status: INITIAL  LONG TERM GOALS: Target date: 01/12/2023    Increase RUE gross strength to >/= 4/5 to promote scapulohumeral rhythm and reduce upper trap dominance.  Baseline:  Goal status: INITIAL  2.  Increase RLE strength to >/= 4/5 to promote  stability with standing/ walking and reduce tension in the low back Baseline:  Goal status: INITIAL  3.  Pt to report RLE referred pain and spams to </=  4 x in 2 week time frame for improvement in condition. Baseline:  Goal status: INITIAL  4.  Increase FOTO score to 66% to demo improvement in function Baseline:  Goal status: INITIAL  5.  Pt to be able to perform cervical and RUE ROM required for ADLs with </= 3/10 max  Baseline:  Goal status: INITIAL  6.  Pt to be IND with all HEP to be able to maintain and progress current LOF Baseline:  Goal status: INITIAL  PLAN:  PT FREQUENCY: 1-2x/week  PT DURATION: 8 weeks  PLANNED INTERVENTIONS: Therapeutic exercises, Therapeutic activity, Neuromuscular re-education, Balance training, Gait training, Patient/Family education, Self Care, Joint mobilization, Joint manipulation, Stair training, Aquatic Therapy, Dry Needling, Electrical stimulation, Spinal manipulation, Spinal mobilization, Cryotherapy, Moist heat, Taping, Traction, Ultrasound, Ionotophoresis 4mg /ml Dexamethasone, Manual therapy, and Re-evaluation.  PLAN FOR NEXT SESSION: Review/ update HEP PRN. Flexion biased exercises for back, core strengthening, gross UE/ LE strengthening. STW consider TPDN for R upper trap/ levator and R thoracolumbar paraspinals. Trial manual traction if tolerated well then follow with mechanical traction.    Shahzaib Azevedo PT, DPT, LAT, ATC  12/01/22  10:30 AM

## 2022-12-02 NOTE — Therapy (Signed)
OUTPATIENT PHYSICAL THERAPY TREATMENT   Patient Name: Kendra Rodriguez MRN: [id number] DOB:1966/05/18, 57 y.o., female Today's Date: 12/03/2022  END OF SESSION:  PT End of Session - 12/03/22 1543     Visit Number 3    Number of Visits 17    Date for PT Re-Evaluation 01/12/23    Authorization Type [employer]    PT Start Time 1545    PT Stop Time 1632    PT Time Calculation (min) 47 min    Activity Tolerance Patient tolerated treatment well              Past Medical History:  Diagnosis Date   Acute sinusitis    Allergy    Anxiety    Depression    NO MEDICATIONS - DOING OK NOW   Dizziness    Ear ache    RIGHT --HX OF MULTIPLE EAR INFECTIONS AS A CHILD    GERD (gastroesophageal reflux disease)    Headache(784.0)    MIGRAINES   Leg pain, left    with swelling   Lumbar stenosis    PAIN BACK AND LEFT LEG AND NUMBNESS LEG AND FOOT   Peripheral vascular disease (HCC)    HX OF TREATMENT FOR VARICOSE VEINS RIGHT LEG   Thyroid disease    TOLD BLOOD LEVELS SLIGHTLY ABNORMAL - BUT NOT REQUIRED MEDICATION   Past Surgical History:  Procedure Laterality Date   BACK SURGERY     bladder lift     AT SAME TIME OF HYSTERECTOMY   BRAVO PH STUDY N/A 07/26/2021   Procedure: BRAVO PH STUDY;  Surgeon: Jeani Hawking, MD;  Location: [facility];  Service: Endoscopy;  Laterality: N/A;   CHOLECYSTECTOMY     ESOPHAGOGASTRODUODENOSCOPY (EGD) WITH PROPOFOL N/A 07/26/2021   Procedure: ESOPHAGOGASTRODUODENOSCOPY (EGD) WITH PROPOFOL;  Surgeon: Jeani Hawking, MD;  Location: [facility];  Service: Endoscopy;  Laterality: N/A;   LUMBAR LAMINECTOMY/DECOMPRESSION MICRODISCECTOMY Left 10/24/2014   Procedure: COMPLETE LUMBAR DECOMPRESSION L4-L5 CENTRAL AND MICRODISCECTOMY L4-L5 LEFT;  Surgeon: Jacki Cones, MD;  Location: [facility];  Service: Orthopedics;  Laterality: Left;   POLYPECTOMY  07/26/2021   Procedure: POLYPECTOMY;  Surgeon: Jeani Hawking, MD;  Location: [facility];  Service: Endoscopy;;    VAGINAL HYSTERECTOMY     Patient Active Problem List   Diagnosis Date Noted   Primary osteoarthritis of left knee 09/08/2022   Vitamin D deficiency 09/08/2022   Hypothyroidism 11/29/2021   Body mass index (BMI) of 35.0-35.9 in adult 06/04/2020   History of migraine headaches 12/06/2019   Dyslipidemia 06/30/2019   GERD with esophagitis 06/30/2019   Chronic vertigo 04/13/2018   Prediabetes 10/17/2015   Spinal stenosis, lumbar region, with neurogenic claudication 10/24/2014    PCP: [name], MD   REFERRING PROVIDER: Rodolph Bong, MD   REFERRING DIAG: Lumbar radiculopathy, right [M54.16], Trapezius muscle spasm [M62.838]   Rationale for Evaluation and Treatment: Rehabilitation  THERAPY DIAG:  Radiculopathy, lumbar region  Abnormal posture  Muscle weakness (generalized)  Cervicalgia  Unspecified lack of coordination  Other muscle spasm  Chronic bilateral low back pain, unspecified whether sciatica present  Chronic right shoulder pain  Chronic left shoulder pain  Chronic right-sided low back pain without sciatica  ONSET DATE: lumbar Radiculopathy 3 months, R upper trap chronically for over 5 years  SUBJECTIVE:  SUBJECTIVE STATEMENT:  My back is 7/10  but now at end I am a 4/10  PERTINENT HISTORY:  Hx of pre-diabetes.  PAIN:  Arm/ neck 6/10 worse with lifting/ carrying items in RUE  Better with rest.   Are you having pain? Yes: NPRS scale: 6/10 Pain location: low back, RLE  Pain description: pain, camping.  Aggravating factors: prolonged sitting, walking, cold  Relieving factors: getting up and moving  PRECAUTIONS: None  WEIGHT BEARING RESTRICTIONS: No  FALLS:  Has patient fallen in last 6 months? No  LIVING ENVIRONMENT: Lives with: lives with their  family Lives in: House/apartment    OCCUPATION: Unemployed   PLOF: Independent with basic ADLs  PATIENT GOALS: decrease pain.  NEXT MD VISIT:   OBJECTIVE:   DIAGNOSTIC FINDINGS:  N/A  PATIENT SURVEYS:  FOTO 54% predicted 66%  SCREENING FOR RED FLAGS: Bowel or bladder incontinence: No   COGNITION: Overall cognitive status: Within functional limits for tasks assessed     SENSATION: Altered sensation on the RLE L1 & L5    POSTURE: rounded shoulders and forward head upper trap dominance noted throughout UE MMT/ ROM assessment  PALPATION: Neck - TTP along the R upper trap/ levator scapulae with palpable trigger pointed reproducing concordant pain  Back- TTP along the R thoracolumbar paraspinals with trigger points noted.    CERVICAL ROM:   Active ROM A/PROM (deg) Eval  Flexion WFL P! PDM  Extension WFL P! ERP  Right lateral flexion WFL  Left lateral flexion WFL P! ERP  Right rotation WFL P! ERP  Left rotation WFL   (Blank rows = not tested)  UE ROM:  Active ROM Right Eval Left Carley Hammed;  Shoulder flexion WFL * WFL  Shoulder extension [facility]  Shoulder abduction WFL * WFL  Shoulder adduction    Shoulder extension [facility]  Shoulder internal rotation    Shoulder external rotation    Elbow flexion    Elbow extension    Wrist flexion    Wrist extension    Wrist ulnar deviation    Wrist radial deviation    Wrist pronation    Wrist supination     (Blank rows = not tested) (pain during movement = *)  UE MMT:  MMT Right Eval Left Eval  Shoulder flexion 4-/5 P! 4-/5  Shoulder extension 4/5 P! 4/5  Shoulder abduction 4-/5 P! 4-/5  Shoulder adduction    Shoulder internal rotation 4-/5 4/5  Shoulder external rotation 4-/5 4/5  Middle trapezius    Lower trapezius    Elbow flexion    Elbow extension    Wrist flexion    Wrist extension    Wrist ulnar deviation    Wrist radial deviation    Wrist pronation    Wrist supination    Grip strength      (Blank rows = not tested)   LUMBAR ROM:   AROM eval  Flexion 50  Extension 10  Right lateral flexion 20  Left lateral flexion 20 *  Right rotation   Left rotation    (Blank rows = not tested) *Rlateral side bend reproduced concordant symptoms     LOWER EXTREMITY ROM:     Active  Right eval Left eval  Hip flexion    Hip extension    Hip abduction    Hip adduction    Hip internal rotation    Hip external rotation    Knee flexion    Knee extension    Ankle dorsiflexion  Ankle plantarflexion    Ankle inversion    Ankle eversion     (Blank rows = not tested)  LOWER EXTREMITY MMT:    MMT Right eval Left eval  Hip flexion 3+/5 * 4/5  Hip extension    Hip abduction 3+/5 3+/5  Hip adduction 4/5 4+/5  Hip internal rotation    Hip external rotation    Knee flexion    Knee extension    Ankle dorsiflexion    Ankle plantarflexion    Ankle inversion    Ankle eversion     (Blank rows = not tested) (*= pain during testing)  LUMBAR SPECIAL TESTS:     Repeated extension in standing increased RLE pain Repated felxion in standing no worse in pain.   FUNCTIONAL TESTS:  5 times sit to stand: 13 sec  GAIT: Distance walked: 120 ft from waiting room to exam room Assistive device utilized: None Level of assistance: Complete Independence Comments: No abnormal movements noted  TODAY'S TREATMENT:                                                                                                                              [facility] Adult PT Treatment:                                                DATE: 12-03-22 Aquatic therapy at [facility] therapeutic pool temp 92 degrees Pt enters building with dtr and interpreter. Treatment took place in water 3.8 to  4 ft 8 .feet deep depending upon activity.  Pt entered and exited the pool via stair and handrails independently.   ms Bluett entered water for aquatic therapy for first time and was introduced to  principles and therapeutic effects of water as she ambulated and acclimated to pool.     She was able to ambulate across pool 15 ft x 4, then side steps x 2 and then ambulates backward x 2 using yellow DB as she acclimated to water.   Pt educated on neutral posture and hip hinging in seated position with water at chest level x 10 with stretch to low back and then x 10 with back at pool wall at external cue, VC for neck tucked to prevent hyperextension Aquatic Exercise:  Heel/ toe raises BIL   Hip ext/flex with knee straight R x 20   Hamstring curl x20 BIL  Hip circles CW/CCW x10 BIL   Hip abduction/adduction x10 BIL  Runners Stretch x 30" x 2 BIL Hamstring stretch x 30" x 2 BIL Figure 4 squat stretch x30" BIL Squats x 20 with BIL UE support Hip adduction squeeze with red med ball x 20 Lunge walking  and side walking using yellow DB  Bad Ragaz, Pt with lumbar belt around hips and inflatable neck collar  for neck support.   Pt assisted into supine floating position by lying head on shoulder of PT to get into floating position. . PT at torso and assisting with trunk left to right and vice versa to engage trunk muscles. PT then rotated trunk in order to engage abdominal (internal and external obliques) Emphasis on breathing techniques to draw in abdominals for support.  Pt then utilizing posterior chain and engaging Hip extension and knee flexion with water resistance while PT used Aqua stretch techniques to decrease muscle tension in low back on  Right low back On submerged bench Bicycle kicks x1' Reverse bicycle kicks x1' Flutter kicks x1' Scissor kicks x 1' and fatigued at end of session   [facility] Adult PT Treatment:                                                DATE: 12/01/2022 Therapeutic Exercise: Ardine Eng pose 2 x 30 sec (demonstration for cues) Cat/ cow exercise 2 x 10 holding each position 3 seconds (demonstation and verbal cues for proper technique) Posterior pelvic tilt in supine 2 x 10  holding 5 seconds ea (noted some soreness in the R low back but reports it eases with reps) Supine marching 2 x 20 Manual Therapy: MPTR along the bil thoracolumbar paraspinals Manual lumbar traction in supine Self-care Reviewed posture in standing to avoid leaning backward or resting her hips on the counter resting on her "Y" ligaments and utilize a step to put one foot on and staying in slightly flexed position. Additionally to take breaks to allow for her body to recover.   Updated HEP today for childs pose and cat/ cow exercise   [facility] Adult PT Treatment:                                                DATE: EVAL Therapeutic Exercise: Low back stretch seated in chair 1 x 30 sec Seated piriformis stretch R LE 1 x 30 sec R upper trap stretch 2 x 30 sec Self Care: Effect of dx of stenosis and benefits of flexion based exercises and benefits of using proper posture to reduce tension. Effects of upper trap dominance and stretching of the R upper trap and reducing hiking the R shoulder.  PATIENT EDUCATION:  Education details: evaluation findings, POC, goals, HEP with proper form/ rationale. Person educated: Patient Education method: Explanation, Verbal cues, and Handouts Education comprehension: verbalized understanding  HOME EXERCISE PROGRAM: Access Code: [email or web address] URL: [email or web address] Date: 12/01/2022 Prepared by: Starr Lake  Exercises - Seated Upper Trapezius Stretch  - 3 x daily - 7 x weekly - 2 sets - 2 reps - 30 seconds hold - Gentle Levator Scapulae Stretch (Mirrored)  - 1 x daily - 7 x weekly - 2 sets - 2 reps - 30 seconds hold - Seated Flexion Stretch  - 1 x daily - 7 x weekly - 2 sets - 10 reps - Seated Single Knee to Chest  - 1 x daily - 7 x weekly - 2 sets - 30 sec hold - Hooklying Single Knee to Chest  - 1 x daily - 7 x weekly - 2 sets - 10 reps - Seated Piriformis Stretch  - 1 x daily -  7 x weekly - 2 sets - 10 reps - Supine March  - 1 x daily  - 7 x weekly - 2 sets - 10 reps - 5 hold - Child's Pose Stretch  - 2 x daily - 7 x weekly - 2 sets - 2 reps - 30 hold - Cat Cow to Child's Pose  - 1 x daily - 7 x weekly - 2 sets - 10 reps  ASSESSMENT:  CLINICAL IMPRESSION: Ms Blazejewski entered pool area with dtr and interpreter.  Session today focused on LE strengthening and activity tolerance in the aquatic environment for use of buoyancy to offload joints and the viscosity of water as resistance during therapeutic exercise.  Pt acclimated to water and was intially tentative about floating with Bad Ragaz technique.  Pt pain 7/10 at initiation of session but 4/10 at end. Patient was able to tolerate all prescribed exercises in the aquatic environment with no adverse effects. Patient continues to benefit from skilled PT services on land and aquatic based and should be progressed as able to improve functional independence.     OBJECTIVE IMPAIRMENTS: decreased activity tolerance, decreased endurance, decreased ROM, decreased strength, hypomobility, impaired sensation, postural dysfunction, and pain.   ACTIVITY LIMITATIONS: carrying, lifting, bending, sitting, standing, and squatting  PARTICIPATION LIMITATIONS: meal prep, cleaning, laundry, shopping, community activity, and yard work  PERSONAL FACTORS: Past/current experiences and 1 comorbidity: Pre-diabetes  are also affecting patient's functional outcome.   REHAB POTENTIAL: Good  CLINICAL DECISION MAKING: Evolving/moderate complexity  EVALUATION COMPLEXITY: Moderate   GOALS: Goals reviewed with patient? Yes  SHORT TERM GOALS: Target date: 12/15/2022    Pt to be IND with initial HEP Baseline: Goal status: INITIAL  2.  Pt to report decreased RLE referred symptoms  and RLE cramping to </= 4 x per week to demo improvement in condition Baseline:  Goal status: INITIAL  3.  Pt be able to perform house hold activities with report of </=min difficulty per FOTO assessment to demo improving  function Baseline:  Goal status: INITIAL  4.  Pt to report reduce tension in the R upper trap and report pain reduction to </= 4/10  Baseline:  Goal status: INITIAL  LONG TERM GOALS: Target date: 01/12/2023    Increase RUE gross strength to >/= 4/5 to promote scapulohumeral rhythm and reduce upper trap dominance.  Baseline:  Goal status: INITIAL  2.  Increase RLE strength to >/= 4/5 to promote stability with standing/ walking and reduce tension in the low back Baseline:  Goal status: INITIAL  3.  Pt to report RLE referred pain and spams to </=4 x in 2 week time frame for improvement in condition. Baseline:  Goal status: INITIAL  4.  Increase FOTO score to 66% to demo improvement in function Baseline:  Goal status: INITIAL  5.  Pt to be able to perform cervical and RUE ROM required for ADLs with </= 3/10 max  Baseline:  Goal status: INITIAL  6.  Pt to be IND with all HEP to be able to maintain and progress current LOF Baseline:  Goal status: INITIAL  PLAN:  PT FREQUENCY: 1-2x/week  PT DURATION: 8 weeks  PLANNED INTERVENTIONS: Therapeutic exercises, Therapeutic activity, Neuromuscular re-education, Balance training, Gait training, Patient/Family education, Self Care, Joint mobilization, Joint manipulation, Stair training, Aquatic Therapy, Dry Needling, Electrical stimulation, Spinal manipulation, Spinal mobilization, Cryotherapy, Moist heat, Taping, Traction, Ultrasound, Ionotophoresis 4mg /ml Dexamethasone, Manual therapy, and Re-evaluation.  PLAN FOR NEXT SESSION: Review/ update HEP PRN. Flexion biased exercises  for back, core strengthening, gross UE/ LE strengthening. STW consider TPDN for R upper trap/ levator and R thoracolumbar paraspinals. Trial manual traction if tolerated well then follow with mechanical traction.   Voncille Lo, PT, [place] Certified Exercise Expert for the Aging Adult  12/03/22 4:49 PM Phone: [phone] Fax: [phone]

## 2022-12-03 ENCOUNTER — Ambulatory Visit: Payer: Commercial Managed Care - HMO | Admitting: Physical Therapy

## 2022-12-03 ENCOUNTER — Other Ambulatory Visit: Payer: Self-pay

## 2022-12-03 ENCOUNTER — Encounter: Payer: Self-pay | Admitting: Physical Therapy

## 2022-12-03 DIAGNOSIS — M5416 Radiculopathy, lumbar region: Secondary | ICD-10-CM | POA: Diagnosis not present

## 2022-12-03 DIAGNOSIS — M542 Cervicalgia: Secondary | ICD-10-CM

## 2022-12-03 DIAGNOSIS — M6281 Muscle weakness (generalized): Secondary | ICD-10-CM

## 2022-12-03 DIAGNOSIS — R279 Unspecified lack of coordination: Secondary | ICD-10-CM

## 2022-12-03 DIAGNOSIS — M62838 Other muscle spasm: Secondary | ICD-10-CM

## 2022-12-03 DIAGNOSIS — G8929 Other chronic pain: Secondary | ICD-10-CM

## 2022-12-03 DIAGNOSIS — M545 Low back pain, unspecified: Secondary | ICD-10-CM

## 2022-12-03 DIAGNOSIS — R293 Abnormal posture: Secondary | ICD-10-CM

## 2022-12-08 ENCOUNTER — Encounter: Payer: Self-pay | Admitting: Physical Therapy

## 2022-12-08 ENCOUNTER — Ambulatory Visit: Payer: Commercial Managed Care - HMO | Attending: Family Medicine | Admitting: Physical Therapy

## 2022-12-08 DIAGNOSIS — R279 Unspecified lack of coordination: Secondary | ICD-10-CM | POA: Insufficient documentation

## 2022-12-08 DIAGNOSIS — M545 Low back pain, unspecified: Secondary | ICD-10-CM | POA: Insufficient documentation

## 2022-12-08 DIAGNOSIS — G8929 Other chronic pain: Secondary | ICD-10-CM | POA: Diagnosis present

## 2022-12-08 DIAGNOSIS — M62838 Other muscle spasm: Secondary | ICD-10-CM | POA: Insufficient documentation

## 2022-12-08 DIAGNOSIS — M25512 Pain in left shoulder: Secondary | ICD-10-CM | POA: Diagnosis present

## 2022-12-08 DIAGNOSIS — M6281 Muscle weakness (generalized): Secondary | ICD-10-CM | POA: Diagnosis present

## 2022-12-08 DIAGNOSIS — M542 Cervicalgia: Secondary | ICD-10-CM | POA: Insufficient documentation

## 2022-12-08 DIAGNOSIS — R293 Abnormal posture: Secondary | ICD-10-CM | POA: Diagnosis present

## 2022-12-08 DIAGNOSIS — M25511 Pain in right shoulder: Secondary | ICD-10-CM | POA: Diagnosis present

## 2022-12-08 DIAGNOSIS — M5416 Radiculopathy, lumbar region: Secondary | ICD-10-CM | POA: Insufficient documentation

## 2022-12-08 NOTE — Therapy (Signed)
OUTPATIENT PHYSICAL THERAPY TREATMENT   Patient Name: Kendra Rodriguez MRN: [id number] DOB:02-13-1966, 57 y.o., female Today's Date: 12/08/2022  END OF SESSION:  PT End of Session - 12/08/22 1018     Visit Number 4    Number of Visits 17    Date for PT Re-Evaluation 01/12/23    Authorization Type [employer]    PT Start Time 0933    PT Stop Time 1016    PT Time Calculation (min) 43 min    Activity Tolerance Patient tolerated treatment well               Past Medical History:  Diagnosis Date   Acute sinusitis    Allergy    Anxiety    Depression    NO MEDICATIONS - DOING OK NOW   Dizziness    Ear ache    RIGHT --HX OF MULTIPLE EAR INFECTIONS AS A CHILD    GERD (gastroesophageal reflux disease)    Headache(784.0)    MIGRAINES   Leg pain, left    with swelling   Lumbar stenosis    PAIN BACK AND LEFT LEG AND NUMBNESS LEG AND FOOT   Peripheral vascular disease (HCC)    HX OF TREATMENT FOR VARICOSE VEINS RIGHT LEG   Thyroid disease    TOLD BLOOD LEVELS SLIGHTLY ABNORMAL - BUT NOT REQUIRED MEDICATION   Past Surgical History:  Procedure Laterality Date   BACK SURGERY     bladder lift     AT SAME TIME OF HYSTERECTOMY   BRAVO [place] STUDY N/A 07/26/2021   Procedure: BRAVO [place] STUDY;  Surgeon: Carol Ada, MD;  Location: [facility];  Service: Endoscopy;  Laterality: N/A;   CHOLECYSTECTOMY     ESOPHAGOGASTRODUODENOSCOPY (EGD) WITH PROPOFOL N/A 07/26/2021   Procedure: ESOPHAGOGASTRODUODENOSCOPY (EGD) WITH PROPOFOL;  Surgeon: Carol Ada, MD;  Location: [facility];  Service: Endoscopy;  Laterality: N/A;   LUMBAR LAMINECTOMY/DECOMPRESSION MICRODISCECTOMY Left 10/24/2014   Procedure: COMPLETE LUMBAR DECOMPRESSION L4-L5 CENTRAL AND MICRODISCECTOMY L4-L5 LEFT;  Surgeon: Tobi Bastos, MD;  Location: [facility];  Service: Orthopedics;  Laterality: Left;   POLYPECTOMY  07/26/2021   Procedure: POLYPECTOMY;  Surgeon: Carol Ada, MD;  Location: [facility];  Service: Endoscopy;;    VAGINAL HYSTERECTOMY     Patient Active Problem List   Diagnosis Date Noted   Primary osteoarthritis of left knee 09/08/2022   Vitamin D deficiency 09/08/2022   Hypothyroidism 11/29/2021   Body mass index (BMI) of 35.0-35.9 in adult 06/04/2020   History of migraine headaches 12/06/2019   Dyslipidemia 06/30/2019   GERD with esophagitis 06/30/2019   Chronic vertigo 04/13/2018   Prediabetes 10/17/2015   Spinal stenosis, lumbar region, with neurogenic claudication 10/24/2014    PCP: Horald Pollen, MD   REFERRING PROVIDER: Gregor Hams, MD   REFERRING DIAG: Lumbar radiculopathy, right [M54.16], Trapezius muscle spasm [M62.838]   Rationale for Evaluation and Treatment: Rehabilitation  THERAPY DIAG:  Radiculopathy, lumbar region  Abnormal posture  Muscle weakness (generalized)  ONSET DATE: lumbar Radiculopathy 3 months, R upper trap chronically for over 5 years  SUBJECTIVE:  SUBJECTIVE STATEMENT: "Pool really helped, and I think everything is helping the back. I am having some soreness in the R upper treatment"  PERTINENT HISTORY:  Hx of pre-diabetes.  PAIN:  Arm/ neck 6/10 worse with lifting/ carrying items in RUE  Better with rest.   Are you having pain? Yes: NPRS scale: 4/10 Pain location: low back, RLE  Pain description: pain, camping.  Aggravating factors: prolonged sitting, walking, cold  Relieving factors: getting up and moving  PRECAUTIONS: None  WEIGHT BEARING RESTRICTIONS: No  FALLS:  Has patient fallen in last 6 months? No  LIVING ENVIRONMENT: Lives with: lives with their family Lives in: House/apartment    OCCUPATION: Unemployed   PLOF: Independent with basic ADLs  PATIENT GOALS: decrease pain.  NEXT MD VISIT:   OBJECTIVE:   DIAGNOSTIC FINDINGS:   N/A  PATIENT SURVEYS:  FOTO 54% predicted 66%  SCREENING FOR RED FLAGS: Bowel or bladder incontinence: No   COGNITION: Overall cognitive status: Within functional limits for tasks assessed     SENSATION: Altered sensation on the RLE L1 & L5    POSTURE: rounded shoulders and forward head upper trap dominance noted throughout UE MMT/ ROM assessment  PALPATION: Neck - TTP along the R upper trap/ levator scapulae with palpable trigger pointed reproducing concordant pain  Back- TTP along the R thoracolumbar paraspinals with trigger points noted.    CERVICAL ROM:   Active ROM A/PROM (deg) Eval  Flexion WFL P! PDM  Extension WFL P! ERP  Right lateral flexion WFL  Left lateral flexion WFL P! ERP  Right rotation WFL P! ERP  Left rotation WFL   (Blank rows = not tested)  UE ROM:  Active ROM Right Eval Left [place];  Shoulder flexion WFL * WFL  Shoulder extension [facility]  Shoulder abduction WFL * WFL  Shoulder adduction    Shoulder extension [facility]  Shoulder internal rotation    Shoulder external rotation    Elbow flexion    Elbow extension    Wrist flexion    Wrist extension    Wrist ulnar deviation    Wrist radial deviation    Wrist pronation    Wrist supination     (Blank rows = not tested) (pain during movement = *)  UE MMT:  MMT Right Eval Left Eval  Shoulder flexion 4-/5 P! 4-/5  Shoulder extension 4/5 P! 4/5  Shoulder abduction 4-/5 P! 4-/5  Shoulder adduction    Shoulder internal rotation 4-/5 4/5  Shoulder external rotation 4-/5 4/5  Middle trapezius    Lower trapezius    Elbow flexion    Elbow extension    Wrist flexion    Wrist extension    Wrist ulnar deviation    Wrist radial deviation    Wrist pronation    Wrist supination    Grip strength     (Blank rows = not tested)   LUMBAR ROM:   AROM eval  Flexion 50  Extension 10  Right lateral flexion 20  Left lateral flexion 20 *  Right rotation   Left rotation    (Blank rows =  not tested) *Rlateral side bend reproduced concordant symptoms     LOWER EXTREMITY ROM:     Active  Right eval Left eval  Hip flexion    Hip extension    Hip abduction    Hip adduction    Hip internal rotation    Hip external rotation    Knee flexion    Knee  extension    Ankle dorsiflexion    Ankle plantarflexion    Ankle inversion    Ankle eversion     (Blank rows = not tested)  LOWER EXTREMITY MMT:    MMT Right eval Left eval  Hip flexion 3+/5 * 4/5  Hip extension    Hip abduction 3+/5 3+/5  Hip adduction 4/5 4+/5  Hip internal rotation    Hip external rotation    Knee flexion    Knee extension    Ankle dorsiflexion    Ankle plantarflexion    Ankle inversion    Ankle eversion     (Blank rows = not tested) (*= pain during testing)  LUMBAR SPECIAL TESTS:     Repeated extension in standing increased RLE pain Repated felxion in standing no worse in pain.   FUNCTIONAL TESTS:  5 times sit to stand: 13 sec  GAIT: Distance walked: 120 ft from waiting room to exam room Assistive device utilized: None Level of assistance: Complete Independence Comments: No abnormal movements noted  TODAY'S TREATMENT:                                                                                                                               [facility] Adult PT Treatment:                                                DATE: 12/08/2022 Therapeutic Exercise: Nu-step L5 x 5 min UE/LE Posterior pelvic tilt 1 x 5 holding 10 seconds, progressed to alternating L/R LE marching  3 x 12 while maintinaining sustained posterior pelvic tilt LTR 2 x 10 (performed between supine core strengthening sets) Lower trap strengthening with elbows propped on wall 2 x 10 with RTB (modified ot YTB due to difficulty) R upper trap 2 x 30 sec stretch (grabbing onto table with RUE to help with scapular depression) Manual Therapy: IASTM along the R upper trap/ cervical parapsinals T1-T8 PA grade III Bil first  rib mobs  Trigger Point Dry-Needling  Treatment instructions: Expect mild to moderate muscle soreness. S/S of pneumothorax if dry needled over a lung field, and to seek immediate medical attention should they occur. Patient verbalized understanding of these instructions and education.  Patient Consent Given: Yes Education handout provided: Yes Muscles treated: R cervical mulifidi and R upper trap/ levator scapuale Electrical stimulation performed: No Parameters: N/A Treatment response/outcome: twitch response noted.  Self Care: Reviewed sleeping posture with keeping head in neutral position to prevent upper trap from getting too tight from excessive shortening. Pt verbalized understanding    [facility] Adult PT Treatment:  DATE: 12-03-22 Aquatic therapy at [facility] [address] - therapeutic pool temp 92 degrees Pt enters building with dtr and interpreter. Treatment took place in water 3.8 to  4 ft 8 .feet deep depending upon activity.  Pt entered and exited the pool via stair and handrails independently.   ms Sferrazza entered water for aquatic therapy for first time and was introduced to principles and therapeutic effects of water as she ambulated and acclimated to pool.     She was able to ambulate across pool 15 ft x 4, then side steps x 2 and then ambulates backward x 2 using yellow DB as she acclimated to water.   Pt educated on neutral posture and hip hinging in seated position with water at chest level x 10 with stretch to low back and then x 10 with back at pool wall at external cue, VC for neck tucked to prevent hyperextension Aquatic Exercise:  Heel/ toe raises BIL   Hip ext/flex with knee straight R x 20   Hamstring curl x20 BIL  Hip circles CW/CCW x10 BIL   Hip abduction/adduction x10 BIL  Runners Stretch x 30" x 2 BIL Hamstring stretch x 30" x 2 BIL Figure 4 squat stretch x30" BIL Squats x 20 with BIL UE support Hip  adduction squeeze with red med ball x 20 Lunge walking  and side walking using yellow DB  Bad Ragaz, Pt with lumbar belt around hips and inflatable neck collar for neck support.   Pt assisted into supine floating position by lying head on shoulder of PT to get into floating position. . PT at torso and assisting with trunk left to right and vice versa to engage trunk muscles. PT then rotated trunk in order to engage abdominal (internal and external obliques) Emphasis on breathing techniques to draw in abdominals for support.  Pt then utilizing posterior chain and engaging Hip extension and knee flexion with water resistance while PT used Aqua stretch techniques to decrease muscle tension in low back on  Right low back On submerged bench Bicycle kicks x1' Reverse bicycle kicks x1' Flutter kicks x1' Scissor kicks x 1' and fatigued at end of session   [facility] Adult PT Treatment:                                                DATE: 12/01/2022 Therapeutic Exercise: Marjo Bicker pose 2 x 30 sec (demonstration for cues) Cat/ cow exercise 2 x 10 holding each position 3 seconds (demonstation and verbal cues for proper technique) Posterior pelvic tilt in supine 2 x 10 holding 5 seconds ea (noted some soreness in the R low back but reports it eases with reps) Supine marching 2 x 20 Manual Therapy: MPTR along the bil thoracolumbar paraspinals Manual lumbar traction in supine Self-care Reviewed posture in standing to avoid leaning backward or resting her hips on the counter resting on her "Y" ligaments and utilize a step to put one foot on and staying in slightly flexed position. Additionally to take breaks to allow for her body to recover.   Updated HEP today for childs pose and cat/ cow exercise  PATIENT EDUCATION:  Education details: evaluation findings, POC, goals, HEP with proper form/ rationale. Person educated: Patient Education method: Explanation, Verbal cues, and Handouts Education comprehension:  verbalized understanding  HOME EXERCISE PROGRAM: Access Code: [email or web address] URL: [email or web address]  Date: 12/01/2022 Prepared by: Lulu Riding  Exercises - Seated Upper Trapezius Stretch  - 3 x daily - 7 x weekly - 2 sets - 2 reps - 30 seconds hold - Gentle Levator Scapulae Stretch (Mirrored)  - 1 x daily - 7 x weekly - 2 sets - 2 reps - 30 seconds hold - Seated Flexion Stretch  - 1 x daily - 7 x weekly - 2 sets - 10 reps - Seated Single Knee to Chest  - 1 x daily - 7 x weekly - 2 sets - 30 sec hold - Hooklying Single Knee to Chest  - 1 x daily - 7 x weekly - 2 sets - 10 reps - Seated Piriformis Stretch  - 1 x daily - 7 x weekly - 2 sets - 10 reps - Supine March  - 1 x daily - 7 x weekly - 2 sets - 10 reps - 5 hold - Child's Pose Stretch  - 2 x daily - 7 x weekly - 2 sets - 2 reps - 30 hold - Cat Cow to Child's Pose  - 1 x daily - 7 x weekly - 2 sets - 10 reps  ASSESSMENT:  CLINICAL IMPRESSION: Pt arrives to PT today noting improvement in low back pain rated today at 4/10 and reported improvement with the pool. Reviewed TPDN and pt consented for R upper trap levator scapuale and cervical paraspinals followed with IASTM and thoracic mobs. Following TPDN she noted pain dropped in the shoulder to 4/10. Continued working on posterior chain activation and flexion biased exercise.     OBJECTIVE IMPAIRMENTS: decreased activity tolerance, decreased endurance, decreased ROM, decreased strength, hypomobility, impaired sensation, postural dysfunction, and pain.   ACTIVITY LIMITATIONS: carrying, lifting, bending, sitting, standing, and squatting  PARTICIPATION LIMITATIONS: meal prep, cleaning, laundry, shopping, community activity, and yard work  PERSONAL FACTORS: Past/current experiences and 1 comorbidity: Pre-diabetes  are also affecting patient's functional outcome.   REHAB POTENTIAL: Good  CLINICAL DECISION MAKING: Evolving/moderate complexity  EVALUATION COMPLEXITY:  Moderate   GOALS: Goals reviewed with patient? Yes  SHORT TERM GOALS: Target date: 12/15/2022    Pt to be IND with initial HEP Baseline: Goal status: INITIAL  2.  Pt to report decreased RLE referred symptoms  and RLE cramping to </= 4 x per week to demo improvement in condition Baseline:  Goal status: INITIAL  3.  Pt be able to perform house hold activities with report of </=min difficulty per FOTO assessment to demo improving function Baseline:  Goal status: INITIAL  4.  Pt to report reduce tension in the R upper trap and report pain reduction to </= 4/10  Baseline:  Goal status: INITIAL  LONG TERM GOALS: Target date: 01/12/2023    Increase RUE gross strength to >/= 4/5 to promote scapulohumeral rhythm and reduce upper trap dominance.  Baseline:  Goal status: INITIAL  2.  Increase RLE strength to >/= 4/5 to promote stability with standing/ walking and reduce tension in the low back Baseline:  Goal status: INITIAL  3.  Pt to report RLE referred pain and spams to </=4 x in 2 week time frame for improvement in condition. Baseline:  Goal status: INITIAL  4.  Increase FOTO score to 66% to demo improvement in function Baseline:  Goal status: INITIAL  5.  Pt to be able to perform cervical and RUE ROM required for ADLs with </= 3/10 max  Baseline:  Goal status: INITIAL  6.  Pt to be IND with all  HEP to be able to maintain and progress current LOF Baseline:  Goal status: INITIAL  PLAN:  PT FREQUENCY: 1-2x/week  PT DURATION: 8 weeks  PLANNED INTERVENTIONS: Therapeutic exercises, Therapeutic activity, Neuromuscular re-education, Balance training, Gait training, Patient/Family education, Self Care, Joint mobilization, Joint manipulation, Stair training, Aquatic Therapy, Dry Needling, Electrical stimulation, Spinal manipulation, Spinal mobilization, Cryotherapy, Moist heat, Taping, Traction, Ultrasound, Ionotophoresis 4mg /ml Dexamethasone, Manual therapy, and  Re-evaluation.  PLAN FOR NEXT SESSION: Review/ update HEP PRN. Flexion biased exercises for back, core strengthening, gross UE/ LE strengthening. STW consider TPDN for R upper trap/ levator and R thoracolumbar paraspinals. Trial manual traction if tolerated well then follow with mechanical traction.    Ansley Mangiapane PT, DPT, LAT, ATC  12/08/22  10:18 AM

## 2022-12-09 NOTE — Progress Notes (Unsigned)
   I, Peterson Lombard, LAT, ATC acting as a scribe for Kendra Leader, MD.  Kendra Rodriguez is a 57 y.o. female who presents to [facility] today for 6-week follow-up low back pain with radiating pain into the left leg. Pt underwent a bilateral lumbar decompression at L4-5 in 2015. Patient was last seen by Dr. Georgina Snell on 10/29/2022 and was prescribed gabapentin and referred to PT, completing 4 visits. Today, pt reports  Dx imaging: 11/27/21 L-spine XR  01/02/21 L-spine XR  08/07/17 L-spine MRI  Pertinent review of systems: ***  Relevant historical information: ***   Exam:  There were no vitals taken for this visit. General: Well Developed, well nourished, and in no acute distress.   MSK: ***    Lab and Radiology Results No results found for this or any previous visit (from the past 72 hour(s)). No results found.     Assessment and Plan: 57 y.o. female with ***   PDMP not reviewed this encounter. No orders of the defined types were placed in this encounter.  No orders of the defined types were placed in this encounter.    Discussed warning signs or symptoms. Please see discharge instructions. Patient expresses understanding.   ***

## 2022-12-09 NOTE — Therapy (Signed)
OUTPATIENT PHYSICAL THERAPY TREATMENT   Patient Name: Kendra Rodriguez MRN: [id number] DOB:1966/03/22, 57 y.o., female Today's Date: 12/10/2022  END OF SESSION:  PT End of Session - 12/10/22 1229     Visit Number 5    Number of Visits 17    Date for PT Re-Evaluation 01/12/23    Authorization Type [employer]    PT Start Time 1230    PT Stop Time 1320    PT Time Calculation (min) 50 min    Activity Tolerance Patient tolerated treatment well    Behavior During Therapy WFL for tasks assessed/performed               Past Medical History:  Diagnosis Date   Acute sinusitis    Allergy    Anxiety    Depression    NO MEDICATIONS - DOING OK NOW   Dizziness    Ear ache    RIGHT --HX OF MULTIPLE EAR INFECTIONS AS A CHILD    GERD (gastroesophageal reflux disease)    Headache(784.0)    MIGRAINES   Leg pain, left    with swelling   Lumbar stenosis    PAIN BACK AND LEFT LEG AND NUMBNESS LEG AND FOOT   Peripheral vascular disease (HCC)    HX OF TREATMENT FOR VARICOSE VEINS RIGHT LEG   Thyroid disease    TOLD BLOOD LEVELS SLIGHTLY ABNORMAL - BUT NOT REQUIRED MEDICATION   Past Surgical History:  Procedure Laterality Date   BACK SURGERY     bladder lift     AT SAME TIME OF HYSTERECTOMY   BRAVO PH STUDY N/A 07/26/2021   Procedure: BRAVO PH STUDY;  Surgeon: Jeani Hawking, MD;  Location: [facility];  Service: Endoscopy;  Laterality: N/A;   CHOLECYSTECTOMY     ESOPHAGOGASTRODUODENOSCOPY (EGD) WITH PROPOFOL N/A 07/26/2021   Procedure: ESOPHAGOGASTRODUODENOSCOPY (EGD) WITH PROPOFOL;  Surgeon: Jeani Hawking, MD;  Location: [facility];  Service: Endoscopy;  Laterality: N/A;   LUMBAR LAMINECTOMY/DECOMPRESSION MICRODISCECTOMY Left 10/24/2014   Procedure: COMPLETE LUMBAR DECOMPRESSION L4-L5 CENTRAL AND MICRODISCECTOMY L4-L5 LEFT;  Surgeon: Jacki Cones, MD;  Location: [facility];  Service: Orthopedics;  Laterality: Left;   POLYPECTOMY  07/26/2021   Procedure: POLYPECTOMY;  Surgeon: Jeani Hawking, MD;  Location: [facility];  Service: Endoscopy;;   VAGINAL HYSTERECTOMY     Patient Active Problem List   Diagnosis Date Noted   Primary osteoarthritis of left knee 09/08/2022   Vitamin D deficiency 09/08/2022   Hypothyroidism 11/29/2021   Body mass index (BMI) of 35.0-35.9 in adult 06/04/2020   History of migraine headaches 12/06/2019   Dyslipidemia 06/30/2019   GERD with esophagitis 06/30/2019   Chronic vertigo 04/13/2018   Prediabetes 10/17/2015   Spinal stenosis, lumbar region, with neurogenic claudication 10/24/2014    PCP: [name], MD   REFERRING PROVIDER: Rodolph Bong, MD   REFERRING DIAG: Lumbar radiculopathy, right [M54.16], Trapezius muscle spasm [M62.838]   Rationale for Evaluation and Treatment: Rehabilitation  THERAPY DIAG:  Radiculopathy, lumbar region  Abnormal posture  Muscle weakness (generalized)  Cervicalgia  Chronic bilateral low back pain, unspecified whether sciatica present  Chronic right shoulder pain  Chronic left shoulder pain  ONSET DATE: lumbar Radiculopathy 3 months, R upper trap chronically for over 5 years  SUBJECTIVE:  SUBJECTIVE STATEMENT: I was really tired and I slept better last time.  I was also exercising at home. I had TPDN done by Gaylyn Rong and I am a little sore but I was not too sore after aquatics  PERTINENT HISTORY:  Hx of pre-diabetes.  PAIN:  12-10-22 Arm/ neck 5/10 worse with lifting/ carrying items in RUE  Back  5/10  Are you having pain? Yes: NPRS scale: 4/10 Pain location: low back, RLE  Pain description: pain, camping.  Aggravating factors: prolonged sitting, walking, cold  Relieving factors: getting up and moving  PRECAUTIONS: None  WEIGHT BEARING RESTRICTIONS: No  FALLS:  Has patient fallen in last 6  months? No  LIVING ENVIRONMENT: Lives with: lives with their family Lives in: House/apartment    OCCUPATION: Unemployed   PLOF: Independent with basic ADLs  PATIENT GOALS: decrease pain.  NEXT MD VISIT:   OBJECTIVE:   DIAGNOSTIC FINDINGS:  N/A  PATIENT SURVEYS:  FOTO 54% predicted 66%  SCREENING FOR RED FLAGS: Bowel or bladder incontinence: No   COGNITION: Overall cognitive status: Within functional limits for tasks assessed     SENSATION: Altered sensation on the RLE L1 & L5    POSTURE: rounded shoulders and forward head upper trap dominance noted throughout UE MMT/ ROM assessment  PALPATION: Neck - TTP along the R upper trap/ levator scapulae with palpable trigger pointed reproducing concordant pain  Back- TTP along the R thoracolumbar paraspinals with trigger points noted.    CERVICAL ROM:   Active ROM A/PROM (deg) Eval  Flexion WFL P! PDM  Extension WFL P! ERP  Right lateral flexion WFL  Left lateral flexion WFL P! ERP  Right rotation WFL P! ERP  Left rotation WFL   (Blank rows = not tested)  UE ROM:  Active ROM Right Eval Left Carley Hammed;  Shoulder flexion WFL * WFL  Shoulder extension [facility]  Shoulder abduction WFL * WFL  Shoulder adduction    Shoulder extension [facility]  Shoulder internal rotation    Shoulder external rotation    Elbow flexion    Elbow extension    Wrist flexion    Wrist extension    Wrist ulnar deviation    Wrist radial deviation    Wrist pronation    Wrist supination     (Blank rows = not tested) (pain during movement = *)  UE MMT:  MMT Right Eval Left Eval  Shoulder flexion 4-/5 P! 4-/5  Shoulder extension 4/5 P! 4/5  Shoulder abduction 4-/5 P! 4-/5  Shoulder adduction    Shoulder internal rotation 4-/5 4/5  Shoulder external rotation 4-/5 4/5  Middle trapezius    Lower trapezius    Elbow flexion    Elbow extension    Wrist flexion    Wrist extension    Wrist ulnar deviation    Wrist radial deviation     Wrist pronation    Wrist supination    Grip strength     (Blank rows = not tested)   LUMBAR ROM:   AROM eval  Flexion 50  Extension 10  Right lateral flexion 20  Left lateral flexion 20 *  Right rotation   Left rotation    (Blank rows = not tested) *Rlateral side bend reproduced concordant symptoms     LOWER EXTREMITY ROM:     Active  Right eval Left eval  Hip flexion    Hip extension    Hip abduction    Hip adduction    Hip internal  rotation    Hip external rotation    Knee flexion    Knee extension    Ankle dorsiflexion    Ankle plantarflexion    Ankle inversion    Ankle eversion     (Blank rows = not tested)  LOWER EXTREMITY MMT:    MMT Right eval Left eval  Hip flexion 3+/5 * 4/5  Hip extension    Hip abduction 3+/5 3+/5  Hip adduction 4/5 4+/5  Hip internal rotation    Hip external rotation    Knee flexion    Knee extension    Ankle dorsiflexion    Ankle plantarflexion    Ankle inversion    Ankle eversion     (Blank rows = not tested) (*= pain during testing)  LUMBAR SPECIAL TESTS:     Repeated extension in standing increased RLE pain Repated felxion in standing no worse in pain.   FUNCTIONAL TESTS:  5 times sit to stand: 13 sec  GAIT: Distance walked: 120 ft from waiting room to exam room Assistive device utilized: None Level of assistance: Complete Independence Comments: No abnormal movements noted  TODAY'S TREATMENT:                                                                                                                              [facility] Adult PT Treatment:                                                DATE: 12-10-22 Aquatic therapy at [facility] therapeutic pool temp 91 degrees Pt enters building with dtr and interpreter. Treatment took place in water 3.8 to  4 ft 8 .feet deep depending upon activity.  Pt entered and exited the pool via stair and handrails independently.   Ms. Vath entered water  for aquatic therapy for first time and was introduced to principles and therapeutic effects of water as she ambulated and acclimated to pool.     Pain at 5/10 at beginning of session Ambulating back and forth to acclimate to water and warm up   Aquatic Exercise:  Began with UE with aquatic hand bells for resistance with BIl horizontal abd/add, Bil abduction/add, Bil flex/ext x 20 each Using pool noodle for gentle lumbar rotation to RT and LT x 10 Heel/ toe raises BIL   Below using aquatic fins for added resistance VC and TC in water for correct execution and posture Hip ext/flex with knee straight R x 20   Hamstring curl x20 BIL  Hip circles CW/CCW x10 BIL   Hip abduction/adduction x10 BIL  Lunge to Target exercise with VC for upright posture Figure 4 squat stretch x30" BIL Squats x 20 with BIL UE support Treading water for 15 sec x 3 with PT CGA x 1  On submerged  bench Bicycle kicks x1' Reverse bicycle kicks x1' Flutter kicks x1' Scissor kicks x 1'  Bad Ragaz, Pt with lumbar belt around hips and inflatable neck collar for neck support.   Pt assisted into supine floating position by lying head on shoulder of PT to get into floating position. PT at torso and assisting with trunk left to right and vice versa to engage trunk muscles. PT then rotated trunk in order to engage abdominal (internal and external obliques) Emphasis on breathing techniques to draw in abdominals for support.  Pt then utilizing posterior chain and engaging Hip extension and knee flexion with water resistance while PT used  LAD in water with pt supine floating.  Pt with 0/10 pain at end of session  Jogging across pool x 4 Backward jogging across pool x 2 Pt requires the buoyancy of water for active assisted exercises with buoyancy supported for strengthening and AROM exercises. Hydrostatic pressure also supports joints by unweighting joint load by at least 50 % in 3-4 feet depth water. 80% in chest to neck deep water.  Water will provide assistance with movement using the current and laminar flow while the buoyancy reduces weight bearing. Pt requires the viscosity of the water for resistance with strengthening exercises.   [facility] Adult PT Treatment:                                                DATE: 12/08/2022 Therapeutic Exercise: Nu-step L5 x 5 min UE/LE Posterior pelvic tilt 1 x 5 holding 10 seconds, progressed to alternating L/R LE marching  3 x 12 while maintinaining sustained posterior pelvic tilt LTR 2 x 10 (performed between supine core strengthening sets) Lower trap strengthening with elbows propped on wall 2 x 10 with RTB (modified ot YTB due to difficulty) R upper trap 2 x 30 sec stretch (grabbing onto table with RUE to help with scapular depression) Manual Therapy: IASTM along the R upper trap/ cervical parapsinals T1-T8 PA grade III Bil first rib mobs  Trigger Point Dry-Needling  Treatment instructions: Expect mild to moderate muscle soreness. S/S of pneumothorax if dry needled over a lung field, and to seek immediate medical attention should they occur. Patient verbalized understanding of these instructions and education.  Patient Consent Given: Yes Education handout provided: Yes Muscles treated: R cervical mulifidi and R upper trap/ levator scapuale Electrical stimulation performed: No Parameters: N/A Treatment response/outcome: twitch response noted.  Self Care: Reviewed sleeping posture with keeping head in neutral position to prevent upper trap from getting too tight from excessive shortening. Pt verbalized understanding    [place] Adult PT Treatment:                                                DATE: 12-03-22 Aquatic therapy at [facility] therapeutic pool temp 92 degrees Pt enters building with dtr and interpreter. Treatment took place in water 3.8 to  4 ft 8 .feet deep depending upon activity.  Pt entered and exited the pool via stair and handrails  independently.   ms Chriswell entered water for aquatic therapy for first time and was introduced to principles and therapeutic effects of water as she ambulated and acclimated to pool.  She was able to ambulate across pool 15 ft x 4, then side steps x 2 and then ambulates backward x 2 using yellow DB as she acclimated to water.   Pt educated on neutral posture and hip hinging in seated position with water at chest level x 10 with stretch to low back and then x 10 with back at pool wall at external cue, VC for neck tucked to prevent hyperextension Aquatic Exercise:  Heel/ toe raises BIL   Hip ext/flex with knee straight R x 20   Hamstring curl x20 BIL  Hip circles CW/CCW x10 BIL   Hip abduction/adduction x10 BIL  Runners Stretch x 30" x 2 BIL Hamstring stretch x 30" x 2 BIL Figure 4 squat stretch x30" BIL Squats x 20 with BIL UE support Hip adduction squeeze with red med ball x 20 Lunge walking  and side walking using yellow DB  Bad Ragaz, Pt with lumbar belt around hips and inflatable neck collar for neck support.   Pt assisted into supine floating position by lying head on shoulder of PT to get into floating position. . PT at torso and assisting with trunk left to right and vice versa to engage trunk muscles. PT then rotated trunk in order to engage abdominal (internal and external obliques) Emphasis on breathing techniques to draw in abdominals for support.  Pt then utilizing posterior chain and engaging Hip extension and knee flexion with water resistance while PT used Aqua stretch techniques to decrease muscle tension in low back on  Right low back On submerged bench Bicycle kicks x1' Reverse bicycle kicks x1' Flutter kicks x1' Scissor kicks x 1' and fatigued at end of session   [facility] Adult PT Treatment:                                                DATE: 12/01/2022 Therapeutic Exercise: Marjo Bickerhilds pose 2 x 30 sec (demonstration for cues) Cat/ cow exercise 2 x 10 holding each position 3  seconds (demonstation and verbal cues for proper technique) Posterior pelvic tilt in supine 2 x 10 holding 5 seconds ea (noted some soreness in the R low back but reports it eases with reps) Supine marching 2 x 20 Manual Therapy: MPTR along the bil thoracolumbar paraspinals Manual lumbar traction in supine Self-care Reviewed posture in standing to avoid leaning backward or resting her hips on the counter resting on her "Y" ligaments and utilize a step to put one foot on and staying in slightly flexed position. Additionally to take breaks to allow for her body to recover.   Updated HEP today for childs pose and cat/ cow exercise  PATIENT EDUCATION:  Education details: evaluation findings, POC, goals, HEP with proper form/ rationale. Person educated: Patient Education method: Explanation, Verbal cues, and Handouts Education comprehension: verbalized understanding  HOME EXERCISE PROGRAM: Access Code: [email or web address] URL: [email or web address] Date: 12/01/2022 Prepared by: Lulu RidingKristoffer Leamon  Exercises - Seated Upper Trapezius Stretch  - 3 x daily - 7 x weekly - 2 sets - 2 reps - 30 seconds hold - Gentle Levator Scapulae Stretch (Mirrored)  - 1 x daily - 7 x weekly - 2 sets - 2 reps - 30 seconds hold - Seated Flexion Stretch  - 1 x daily - 7 x weekly - 2 sets - 10 reps - Seated Single Knee to Chest  - 1  x daily - 7 x weekly - 2 sets - 30 sec hold - Hooklying Single Knee to Chest  - 1 x daily - 7 x weekly - 2 sets - 10 reps - Seated Piriformis Stretch  - 1 x daily - 7 x weekly - 2 sets - 10 reps - Supine March  - 1 x daily - 7 x weekly - 2 sets - 10 reps - 5 hold - Child's Pose Stretch  - 2 x daily - 7 x weekly - 2 sets - 2 reps - 30 hold - Cat Cow to Child's Pose  - 1 x daily - 7 x weekly - 2 sets - 10 reps  ASSESSMENT:  CLINICAL IMPRESSION: Pt arrives to aquatics today noting improvement in low back pain rated today at 5/10 from last pool session and she also reports that she  is not too tired from last session.  She also reported sleeping better.  She is still apprehensive in water and needs PT to be hands on in pool for security and TC/VC for proper form. Session concentrated on UE and LE strength and flexibility as well as posterior chain strengthening. Pt benefit from skilled PT in aquatics and land to maximize and achieve goals.  Pt 5/10 at initiation of PT RX at pool and ended with 0/10.    OBJECTIVE IMPAIRMENTS: decreased activity tolerance, decreased endurance, decreased ROM, decreased strength, hypomobility, impaired sensation, postural dysfunction, and pain.   ACTIVITY LIMITATIONS: carrying, lifting, bending, sitting, standing, and squatting  PARTICIPATION LIMITATIONS: meal prep, cleaning, laundry, shopping, community activity, and yard work  PERSONAL FACTORS: Past/current experiences and 1 comorbidity: Pre-diabetes  are also affecting patient's functional outcome.   REHAB POTENTIAL: Good  CLINICAL DECISION MAKING: Evolving/moderate complexity  EVALUATION COMPLEXITY: Moderate   GOALS: Goals reviewed with patient? Yes  SHORT TERM GOALS: Target date: 12/15/2022    Pt to be IND with initial HEP Baseline: Goal status: INITIAL  2.  Pt to report decreased RLE referred symptoms  and RLE cramping to </= 4 x per week to demo improvement in condition Baseline:  Goal status: INITIAL  3.  Pt be able to perform house hold activities with report of </=min difficulty per FOTO assessment to demo improving function Baseline:  Goal status: INITIAL  4.  Pt to report reduce tension in the R upper trap and report pain reduction to </= 4/10  Baseline:  Goal status: INITIAL  LONG TERM GOALS: Target date: 01/12/2023    Increase RUE gross strength to >/= 4/5 to promote scapulohumeral rhythm and reduce upper trap dominance.  Baseline:  Goal status: INITIAL  2.  Increase RLE strength to >/= 4/5 to promote stability with standing/ walking and reduce tension  in the low back Baseline:  Goal status: INITIAL  3.  Pt to report RLE referred pain and spams to </=4 x in 2 week time frame for improvement in condition. Baseline:  Goal status: INITIAL  4.  Increase FOTO score to 66% to demo improvement in function Baseline:  Goal status: INITIAL  5.  Pt to be able to perform cervical and RUE ROM required for ADLs with </= 3/10 max  Baseline:  Goal status: INITIAL  6.  Pt to be IND with all HEP to be able to maintain and progress current LOF Baseline:  Goal status: INITIAL  PLAN:  PT FREQUENCY: 1-2x/week  PT DURATION: 8 weeks  PLANNED INTERVENTIONS: Therapeutic exercises, Therapeutic activity, Neuromuscular re-education, Balance training, Gait training, Patient/Family education, Self Care,  Joint mobilization, Joint manipulation, Stair training, Aquatic Therapy, Dry Needling, Electrical stimulation, Spinal manipulation, Spinal mobilization, Cryotherapy, Moist heat, Taping, Traction, Ultrasound, Ionotophoresis 4mg /ml Dexamethasone, Manual therapy, and Re-evaluation.  PLAN FOR NEXT SESSION: Review/ update HEP PRN. Flexion biased exercises for back, core strengthening, gross UE/ LE strengthening. STW consider TPDN for R upper trap/ levator and R thoracolumbar paraspinals. Trial manual traction if tolerated well then follow with mechanical traction.   , PT, [facility] Certified Exercise Expert for the Aging Adult  12/10/22 1:35 PM Phone: [phone] Fax: [phone]

## 2022-12-10 ENCOUNTER — Encounter: Payer: Self-pay | Admitting: Family Medicine

## 2022-12-10 ENCOUNTER — Encounter: Payer: Self-pay | Admitting: Physical Therapy

## 2022-12-10 ENCOUNTER — Other Ambulatory Visit: Payer: Self-pay

## 2022-12-10 ENCOUNTER — Ambulatory Visit (INDEPENDENT_AMBULATORY_CARE_PROVIDER_SITE_OTHER): Payer: Commercial Managed Care - HMO | Admitting: Family Medicine

## 2022-12-10 ENCOUNTER — Ambulatory Visit: Payer: Commercial Managed Care - HMO | Admitting: Physical Therapy

## 2022-12-10 VITALS — BP 130/82 | HR 72 | Ht 61.5 in | Wt 194.6 lb

## 2022-12-10 DIAGNOSIS — M545 Low back pain, unspecified: Secondary | ICD-10-CM

## 2022-12-10 DIAGNOSIS — M5416 Radiculopathy, lumbar region: Secondary | ICD-10-CM

## 2022-12-10 DIAGNOSIS — M6281 Muscle weakness (generalized): Secondary | ICD-10-CM

## 2022-12-10 DIAGNOSIS — G8929 Other chronic pain: Secondary | ICD-10-CM

## 2022-12-10 DIAGNOSIS — M542 Cervicalgia: Secondary | ICD-10-CM

## 2022-12-10 DIAGNOSIS — R293 Abnormal posture: Secondary | ICD-10-CM

## 2022-12-10 NOTE — Patient Instructions (Addendum)
Thank you for coming in today.   Continue PT for 1 month.   If not better let me know and I will order an MRI to plan for back injection.

## 2022-12-15 ENCOUNTER — Encounter: Payer: Self-pay | Admitting: Physical Therapy

## 2022-12-15 ENCOUNTER — Ambulatory Visit: Payer: Commercial Managed Care - HMO | Admitting: Physical Therapy

## 2022-12-15 DIAGNOSIS — R293 Abnormal posture: Secondary | ICD-10-CM

## 2022-12-15 DIAGNOSIS — M6281 Muscle weakness (generalized): Secondary | ICD-10-CM

## 2022-12-15 DIAGNOSIS — M5416 Radiculopathy, lumbar region: Secondary | ICD-10-CM | POA: Diagnosis not present

## 2022-12-15 DIAGNOSIS — M542 Cervicalgia: Secondary | ICD-10-CM

## 2022-12-15 NOTE — Therapy (Signed)
OUTPATIENT PHYSICAL THERAPY TREATMENT   Patient Name: Kendra Rodriguez MRN: [id number] DOB:May 12, 1966, 58 y.o., female Today's Date: 12/15/2022  END OF SESSION:  PT End of Session - 12/15/22 0931     Visit Number 6    Number of Visits 17    Date for PT Re-Evaluation 01/12/23    Authorization Type [employer]    PT Start Time [id number]    PT Stop Time 1014    PT Time Calculation (min) 43 min    Activity Tolerance Patient tolerated treatment well    Behavior During Therapy WFL for tasks assessed/performed                Past Medical History:  Diagnosis Date   Acute sinusitis    Allergy    Anxiety    Depression    NO MEDICATIONS - DOING OK NOW   Dizziness    Ear ache    RIGHT --HX OF MULTIPLE EAR INFECTIONS AS A CHILD    GERD (gastroesophageal reflux disease)    Headache(784.0)    MIGRAINES   Leg pain, left    with swelling   Lumbar stenosis    PAIN BACK AND LEFT LEG AND NUMBNESS LEG AND FOOT   Peripheral vascular disease (HCC)    HX OF TREATMENT FOR VARICOSE VEINS RIGHT LEG   Thyroid disease    TOLD BLOOD LEVELS SLIGHTLY ABNORMAL - BUT NOT REQUIRED MEDICATION   Past Surgical History:  Procedure Laterality Date   BACK SURGERY     bladder lift     AT SAME TIME OF HYSTERECTOMY   BRAVO [place] STUDY N/A 07/26/2021   Procedure: BRAVO [place] STUDY;  Surgeon: Carol Ada, MD;  Location: [facility];  Service: Endoscopy;  Laterality: N/A;   CHOLECYSTECTOMY     ESOPHAGOGASTRODUODENOSCOPY (EGD) WITH PROPOFOL N/A 07/26/2021   Procedure: ESOPHAGOGASTRODUODENOSCOPY (EGD) WITH PROPOFOL;  Surgeon: Carol Ada, MD;  Location: [facility];  Service: Endoscopy;  Laterality: N/A;   LUMBAR LAMINECTOMY/DECOMPRESSION MICRODISCECTOMY Left 10/24/2014   Procedure: COMPLETE LUMBAR DECOMPRESSION L4-L5 CENTRAL AND MICRODISCECTOMY L4-L5 LEFT;  Surgeon: Tobi Bastos, MD;  Location: [facility];  Service: Orthopedics;  Laterality: Left;   POLYPECTOMY  07/26/2021   Procedure: POLYPECTOMY;  Surgeon: Carol Ada, MD;  Location: [facility];  Service: Endoscopy;;   VAGINAL HYSTERECTOMY     Patient Active Problem List   Diagnosis Date Noted   Primary osteoarthritis of left knee 09/08/2022   Vitamin D deficiency 09/08/2022   Hypothyroidism 11/29/2021   Body mass index (BMI) of 35.0-35.9 in adult 06/04/2020   History of migraine headaches 12/06/2019   Dyslipidemia 06/30/2019   GERD with esophagitis 06/30/2019   Chronic vertigo 04/13/2018   Prediabetes 10/17/2015   Spinal stenosis, lumbar region, with neurogenic claudication 10/24/2014    PCP: Horald Pollen, MD   REFERRING PROVIDER: Gregor Hams, MD   REFERRING DIAG: Lumbar radiculopathy, right [M54.16], Trapezius muscle spasm [M62.838]   Rationale for Evaluation and Treatment: Rehabilitation  THERAPY DIAG:  Radiculopathy, lumbar region  Abnormal posture  Muscle weakness (generalized)  Cervicalgia  ONSET DATE: lumbar Radiculopathy 3 months, R upper trap chronically for over 5 years  SUBJECTIVE:  SUBJECTIVE STATEMENT: My neck/ shoulder is doing better and is maybe a 2/10, back is 4/10. I have been working on my sleeping with different pillows.   PERTINENT HISTORY:  Hx of pre-diabetes.  PAIN:  Arm/ neck 2/10 worse with lifting/ carrying items in RUE  Back  4/10  Are you having pain? Yes: NPRS scale: 4/10 Pain location: low back, RLE  Pain description: pain, camping.  Aggravating factors: prolonged sitting, walking, cold  Relieving factors: getting up and moving  PRECAUTIONS: None  WEIGHT BEARING RESTRICTIONS: No  FALLS:  Has patient fallen in last 6 months? No  LIVING ENVIRONMENT: Lives with: lives with their family Lives in: House/apartment    OCCUPATION: Unemployed   PLOF: Independent with basic ADLs  PATIENT  GOALS: decrease pain.  NEXT MD VISIT:   OBJECTIVE:   DIAGNOSTIC FINDINGS:  N/A  PATIENT SURVEYS:  FOTO 54% predicted 66% 12/15/2022 48%  SCREENING FOR RED FLAGS: Bowel or bladder incontinence: No   COGNITION: Overall cognitive status: Within functional limits for tasks assessed     SENSATION: Altered sensation on the RLE L1 & L5    POSTURE: rounded shoulders and forward head upper trap dominance noted throughout UE MMT/ ROM assessment  PALPATION: Neck - TTP along the R upper trap/ levator scapulae with palpable trigger pointed reproducing concordant pain  Back- TTP along the R thoracolumbar paraspinals with trigger points noted.    CERVICAL ROM:   Active ROM A/PROM (deg) Eval  Flexion WFL P! PDM  Extension WFL P! ERP  Right lateral flexion WFL  Left lateral flexion WFL P! ERP  Right rotation WFL P! ERP  Left rotation WFL   (Blank rows = not tested)  UE ROM:  Active ROM Right Eval Left [place];  Shoulder flexion WFL * WFL  Shoulder extension [facility]  Shoulder abduction WFL * WFL  Shoulder adduction    Shoulder extension [facility]  Shoulder internal rotation    Shoulder external rotation    Elbow flexion    Elbow extension    Wrist flexion    Wrist extension    Wrist ulnar deviation    Wrist radial deviation    Wrist pronation    Wrist supination     (Blank rows = not tested) (pain during movement = *)  UE MMT:  MMT Right Eval Left Eval  Shoulder flexion 4-/5 P! 4-/5  Shoulder extension 4/5 P! 4/5  Shoulder abduction 4-/5 P! 4-/5  Shoulder adduction    Shoulder internal rotation 4-/5 4/5  Shoulder external rotation 4-/5 4/5  Middle trapezius    Lower trapezius    Elbow flexion    Elbow extension    Wrist flexion    Wrist extension    Wrist ulnar deviation    Wrist radial deviation    Wrist pronation    Wrist supination    Grip strength     (Blank rows = not tested)   LUMBAR ROM:   AROM eval  Flexion 50  Extension 10  Right  lateral flexion 20  Left lateral flexion 20 *  Right rotation   Left rotation    (Blank rows = not tested) *Rlateral side bend reproduced concordant symptoms     LOWER EXTREMITY ROM:     Active  Right eval Left eval  Hip flexion    Hip extension    Hip abduction    Hip adduction    Hip internal rotation    Hip external rotation    Knee  flexion    Knee extension    Ankle dorsiflexion    Ankle plantarflexion    Ankle inversion    Ankle eversion     (Blank rows = not tested)  LOWER EXTREMITY MMT:    MMT Right eval Left eval  Hip flexion 3+/5 * 4/5  Hip extension    Hip abduction 3+/5 3+/5  Hip adduction 4/5 4+/5  Hip internal rotation    Hip external rotation    Knee flexion    Knee extension    Ankle dorsiflexion    Ankle plantarflexion    Ankle inversion    Ankle eversion     (Blank rows = not tested) (*= pain during testing)  LUMBAR SPECIAL TESTS:     Repeated extension in standing increased RLE pain Repated felxion in standing no worse in pain.   FUNCTIONAL TESTS:  5 times sit to stand: 13 sec  GAIT: Distance walked: 120 ft from waiting room to exam room Assistive device utilized: None Level of assistance: Complete Independence Comments: No abnormal movements noted  TODAY'S TREATMENT:                                                                                                                              [facility] Adult PT Treatment:                                                DATE: 12/15/2022 Therapeutic Exercise: Treadmill x 3 min on incline L3 x 1.5 (increased RLE / calf pain) Progressed to recumbent bike after treadmill L3 x 5 min (improved RLE calf pain) R upper trap stretch 1 x 30  Dead bug 1 x 5 holding 10 seconds combined with PPT requiring TC for activation LTR 1 x 10 Supine R hip flexor stretch  2 x 30    Reviewed exercises and benefits of using a flexion based exercise Self Care: Time was taken to review spinal anatomy and effects  of stenosis on her ADLs and impact of extension causing RLE calf pain, and beneftis of flexion. Discussed that only way to truly assess it is MRI and discussing with MD plans moving forward.    [facility] Adult PT Treatment:                                                DATE: 12-10-22 Aquatic therapy at [place] pool temp 91 degrees Pt enters building with dtr and interpreter. Treatment took place in water 3.8 to  4 ft 8 .feet deep depending upon activity.  Pt entered and exited the pool via stair and handrails independently.   Ms. Legleiter entered water for  aquatic therapy for first time and was introduced to principles and therapeutic effects of water as she ambulated and acclimated to pool.     Pain at 5/10 at beginning of session Ambulating back and forth to acclimate to water and warm up   Aquatic Exercise:  Began with UE with aquatic hand bells for resistance with BIl horizontal abd/add, Bil abduction/add, Bil flex/ext x 20 each Using pool noodle for gentle lumbar rotation to RT and LT x 10 Heel/ toe raises BIL   Below using aquatic fins for added resistance VC and TC in water for correct execution and posture Hip ext/flex with knee straight R x 20   Hamstring curl x20 BIL  Hip circles CW/CCW x10 BIL   Hip abduction/adduction x10 BIL  Lunge to Target exercise with VC for upright posture Figure 4 squat stretch x30" BIL Squats x 20 with BIL UE support Treading water for 15 sec x 3 with PT CGA x 1  On submerged bench Bicycle kicks x1' Reverse bicycle kicks x1' Flutter kicks x1' Scissor kicks x 1'  [place]az, Pt with lumbar belt around hips and inflatable neck collar for neck support.   Pt assisted into supine floating position by lying head on shoulder of PT to get into floating position. PT at torso and assisting with trunk left to right and vice versa to engage trunk muscles. PT then rotated trunk in order to engage abdominal (internal and external  obliques) Emphasis on breathing techniques to draw in abdominals for support.  Pt then utilizing posterior chain and engaging Hip extension and knee flexion with water resistance while PT used  LAD in water with pt supine floating.  Pt with 0/10 pain at end of session  Jogging across pool x 4 Backward jogging across pool x 2 Pt requires the buoyancy of water for active assisted exercises with buoyancy supported for strengthening and AROM exercises. Hydrostatic pressure also supports joints by unweighting joint load by at least 50 % in 3-4 feet depth water. 80% in chest to neck deep water. Water will provide assistance with movement using the current and laminar flow while the buoyancy reduces weight bearing. Pt requires the viscosity of the water for resistance with strengthening exercises.   [facility] Adult PT Treatment:                                                DATE: 12/08/2022 Therapeutic Exercise: Nu-step L5 x 5 min UE/LE Posterior pelvic tilt 1 x 5 holding 10 seconds, progressed to alternating L/R LE marching  3 x 12 while maintinaining sustained posterior pelvic tilt LTR 2 x 10 (performed between supine core strengthening sets) Lower trap strengthening with elbows propped on wall 2 x 10 with RTB (modified ot YTB due to difficulty) R upper trap 2 x 30 sec stretch (grabbing onto table with RUE to help with scapular depression) Manual Therapy: IASTM along the R upper trap/ cervical parapsinals T1-T8 PA grade III Bil first rib mobs  Trigger Point Dry-Needling  Treatment instructions: Expect mild to moderate muscle soreness. S/S of pneumothorax if dry needled over a lung field, and to seek immediate medical attention should they occur. Patient verbalized understanding of these instructions and education.  Patient Consent Given: Yes Education handout provided: Yes Muscles treated: R cervical mulifidi and R upper trap/ levator scapuale Electrical stimulation performed: No  Parameters:  N/A Treatment response/outcome: twitch response noted.  Self Care: Reviewed sleeping posture with keeping head in neutral position to prevent upper trap from getting too tight from excessive shortening. Pt verbalized understanding   PATIENT EDUCATION:  Education details: evaluation findings, POC, goals, HEP with proper form/ rationale. Person educated: Patient Education method: Explanation, Verbal cues, and Handouts Education comprehension: verbalized understanding  HOME EXERCISE PROGRAM: Access Code: [email or web address] URL: [email or web address] Date: 12/01/2022 Prepared by: Starr Lake  Exercises - Seated Upper Trapezius Stretch  - 3 x daily - 7 x weekly - 2 sets - 2 reps - 30 seconds hold - Gentle Levator Scapulae Stretch (Mirrored)  - 1 x daily - 7 x weekly - 2 sets - 2 reps - 30 seconds hold - Seated Flexion Stretch  - 1 x daily - 7 x weekly - 2 sets - 10 reps - Seated Single Knee to Chest  - 1 x daily - 7 x weekly - 2 sets - 30 sec hold - Hooklying Single Knee to Chest  - 1 x daily - 7 x weekly - 2 sets - 10 reps - Seated Piriformis Stretch  - 1 x daily - 7 x weekly - 2 sets - 10 reps - Supine March  - 1 x daily - 7 x weekly - 2 sets - 10 reps - 5 hold - Child's Pose Stretch  - 2 x daily - 7 x weekly - 2 sets - 2 reps - 30 hold - Cat Cow to Child's Pose  - 1 x daily - 7 x weekly - 2 sets - 10 reps  ASSESSMENT:  CLINICAL IMPRESSION: Mrs Redditt arrives to PT today reporting she feels better in the shoulder and back since the last sessions. She does continue to have concordant symptoms with increased walking/ standing >15. Trialed exercise to assess neurogenic claudication with incline treadmill walking which increased R calf pain and followed with recumbent bike which she noted improved symptoms. Time was taken to review anatomy and discussed benefits of getting further imaging especially since there is limited carryover between multiple visits and she has been dealing  with it for years. Continued to utilize flexion based exercise which she continues to respond favorably to.    OBJECTIVE IMPAIRMENTS: decreased activity tolerance, decreased endurance, decreased ROM, decreased strength, hypomobility, impaired sensation, postural dysfunction, and pain.   ACTIVITY LIMITATIONS: carrying, lifting, bending, sitting, standing, and squatting  PARTICIPATION LIMITATIONS: meal prep, cleaning, laundry, shopping, community activity, and yard work  PERSONAL FACTORS: Past/current experiences and 1 comorbidity: Pre-diabetes  are also affecting patient's functional outcome.   REHAB POTENTIAL: Good  CLINICAL DECISION MAKING: Evolving/moderate complexity  EVALUATION COMPLEXITY: Moderate   GOALS: Goals reviewed with patient? Yes  SHORT TERM GOALS: Target date: 12/15/2022    Pt to be IND with initial HEP Baseline: Goal status: INITIAL  2.  Pt to report decreased RLE referred symptoms  and RLE cramping to </= 4 x per week to demo improvement in condition Baseline:  Goal status: INITIAL  3.  Pt be able to perform house hold activities with report of </=min difficulty per FOTO assessment to demo improving function Baseline:  Goal status: INITIAL  4.  Pt to report reduce tension in the R upper trap and report pain reduction to </= 4/10  Baseline:  Goal status: INITIAL  LONG TERM GOALS: Target date: 01/12/2023    Increase RUE gross strength to >/= 4/5 to promote scapulohumeral rhythm and reduce upper trap dominance.  Baseline:  Goal  status: INITIAL  2.  Increase RLE strength to >/= 4/5 to promote stability with standing/ walking and reduce tension in the low back Baseline:  Goal status: INITIAL  3.  Pt to report RLE referred pain and spams to </=4 x in 2 week time frame for improvement in condition. Baseline:  Goal status: INITIAL  4.  Increase FOTO score to 66% to demo improvement in function Baseline:  Goal status: INITIAL  5.  Pt to be able to  perform cervical and RUE ROM required for ADLs with </= 3/10 max  Baseline:  Goal status: INITIAL  6.  Pt to be IND with all HEP to be able to maintain and progress current LOF Baseline:  Goal status: INITIAL  PLAN:  PT FREQUENCY: 1-2x/week  PT DURATION: 8 weeks  PLANNED INTERVENTIONS: Therapeutic exercises, Therapeutic activity, Neuromuscular re-education, Balance training, Gait training, Patient/Family education, Self Care, Joint mobilization, Joint manipulation, Stair training, Aquatic Therapy, Dry Needling, Electrical stimulation, Spinal manipulation, Spinal mobilization, Cryotherapy, Moist heat, Taping, Traction, Ultrasound, Ionotophoresis 50m/ml Dexamethasone, Manual therapy, and Re-evaluation.  PLAN FOR NEXT SESSION: Review/ update HEP PRN. Flexion biased exercises for back, core strengthening, gross UE/ LE strengthening. STW consider TPDN for R upper trap/ levator and R thoracolumbar paraspinals. Trial manual traction if tolerated well then follow with mechanical traction.   Lenice Koper PT, DPT, LAT, ATC  12/15/22  10:18 AM

## 2022-12-16 NOTE — Therapy (Addendum)
PHYSICAL THERAPY DISCHARGE SUMMARY  Visits from Start of Care: 7  Current functional level related to goals / functional outcomes: See goals   Remaining deficits: Current status unknown due to pt not returning.   Education / Equipment: HEP, Lexicographer, anatomy of area's involved.   Patient agrees to discharge. Patient goals were not met. Patient is being discharged due to not returning since the last visit.   Kristoffer Leamon PT, DPT, LAT, ATC  02/04/23  1:27 PM          OUTPATIENT PHYSICAL THERAPY TREATMENT    Patient Name: Kendra Rodriguez MRN: [id number] DOB:1965-11-12, 57 y.o., female Today's Date: 12/17/2022  END OF SESSION:  PT End of Session - 12/17/22 1227     Visit Number 7    Number of Visits 17    Date for PT Re-Evaluation 01/12/23    Authorization Type [employer]    PT Start Time 1330    PT Stop Time 1424    PT Time Calculation (min) 54 min    Activity Tolerance Patient tolerated treatment well    Behavior During Therapy WFL for tasks assessed/performed                 Past Medical History:  Diagnosis Date   Acute sinusitis    Allergy    Anxiety    Depression    NO MEDICATIONS - DOING OK NOW   Dizziness    Ear ache    RIGHT --HX OF MULTIPLE EAR INFECTIONS AS A CHILD    GERD (gastroesophageal reflux disease)    Headache(784.0)    MIGRAINES   Leg pain, left    with swelling   Lumbar stenosis    PAIN BACK AND LEFT LEG AND NUMBNESS LEG AND FOOT   Peripheral vascular disease (HCC)    HX OF TREATMENT FOR VARICOSE VEINS RIGHT LEG   Thyroid disease    TOLD BLOOD LEVELS SLIGHTLY ABNORMAL - BUT NOT REQUIRED MEDICATION   Past Surgical History:  Procedure Laterality Date   BACK SURGERY     bladder lift     AT SAME TIME OF HYSTERECTOMY   BRAVO [place] STUDY N/A 07/26/2021   Procedure: BRAVO [place] STUDY;  Surgeon: Carol Ada, MD;  Location: [facility];  Service: Endoscopy;  Laterality: N/A;   CHOLECYSTECTOMY      ESOPHAGOGASTRODUODENOSCOPY (EGD) WITH PROPOFOL N/A 07/26/2021   Procedure: ESOPHAGOGASTRODUODENOSCOPY (EGD) WITH PROPOFOL;  Surgeon: Carol Ada, MD;  Location: [facility];  Service: Endoscopy;  Laterality: N/A;   LUMBAR LAMINECTOMY/DECOMPRESSION MICRODISCECTOMY Left 10/24/2014   Procedure: COMPLETE LUMBAR DECOMPRESSION L4-L5 CENTRAL AND MICRODISCECTOMY L4-L5 LEFT;  Surgeon: Tobi Bastos, MD;  Location: [facility];  Service: Orthopedics;  Laterality: Left;   POLYPECTOMY  07/26/2021   Procedure: POLYPECTOMY;  Surgeon: Carol Ada, MD;  Location: [facility];  Service: Endoscopy;;   VAGINAL HYSTERECTOMY     Patient Active Problem List   Diagnosis Date Noted   Primary osteoarthritis of left knee 09/08/2022   Vitamin D deficiency 09/08/2022   Hypothyroidism 11/29/2021   Body mass index (BMI) of 35.0-35.9 in adult 06/04/2020   History of migraine headaches 12/06/2019   Dyslipidemia 06/30/2019   GERD with esophagitis 06/30/2019   Chronic vertigo 04/13/2018   Prediabetes 10/17/2015   Spinal stenosis, lumbar region, with neurogenic claudication 10/24/2014    PCP: Horald Pollen, MD   REFERRING PROVIDER: Gregor Hams, MD   REFERRING DIAG: Lumbar radiculopathy, right [M54.16], Trapezius muscle spasm [id number]  Rationale for Evaluation and Treatment: Rehabilitation  THERAPY DIAG:  Radiculopathy, lumbar region  Abnormal posture  Muscle weakness (generalized)  Chronic right shoulder pain  Other muscle spasm  Chronic bilateral low back pain, unspecified whether sciatica present  Unspecified lack of coordination  Cervicalgia  Chronic left shoulder pain  ONSET DATE: lumbar Radiculopathy 3 months, R upper trap chronically for over 5 years  SUBJECTIVE:                                                                                                                                                                                           SUBJECTIVE STATEMENT: Pt  enters water with  back pain at 4/10 pain and reports at worst this week 7/10 and at best 2/10.     PERTINENT HISTORY:  Hx of pre-diabetes.  PAIN:  Arm/ neck 2/10 worse with lifting/ carrying items in RUE  Back  4/10  Are you having pain? Yes: NPRS scale: 4/10 Pain location: low back, RLE  Pain description: pain, camping.  Aggravating factors: prolonged sitting, walking, cold  Relieving factors: getting up and moving  PRECAUTIONS: None  WEIGHT BEARING RESTRICTIONS: No  FALLS:  Has patient fallen in last 6 months? No  LIVING ENVIRONMENT: Lives with: lives with their family Lives in: House/apartment    OCCUPATION: Unemployed   PLOF: Independent with basic ADLs  PATIENT GOALS: decrease pain.  NEXT MD VISIT:   OBJECTIVE:   DIAGNOSTIC FINDINGS:  N/A  PATIENT SURVEYS:  FOTO 54% predicted 66% 12/15/2022 48%  SCREENING FOR RED FLAGS: Bowel or bladder incontinence: No   COGNITION: Overall cognitive status: Within functional limits for tasks assessed     SENSATION: Altered sensation on the RLE L1 & L5    POSTURE: rounded shoulders and forward head upper trap dominance noted throughout UE MMT/ ROM assessment  PALPATION: Neck - TTP along the R upper trap/ levator scapulae with palpable trigger pointed reproducing concordant pain  Back- TTP along the R thoracolumbar paraspinals with trigger points noted.    CERVICAL ROM:   Active ROM A/PROM (deg) Eval  Flexion WFL P! PDM  Extension WFL P! ERP  Right lateral flexion WFL  Left lateral flexion WFL P! ERP  Right rotation WFL P! ERP  Left rotation WFL   (Blank rows = not tested)  UE ROM:  Active ROM Right Eval Left [place];  Shoulder flexion WFL * WFL  Shoulder extension [facility]  Shoulder abduction WFL * [facility]  Shoulder adduction    Shoulder extension [facility]  Shoulder internal rotation    Shoulder external rotation    Elbow flexion    Elbow  extension    Wrist flexion    Wrist extension    Wrist  ulnar deviation    Wrist radial deviation    Wrist pronation    Wrist supination     (Blank rows = not tested) (pain during movement = *)  UE MMT:  MMT Right Eval Left Eval  Shoulder flexion 4-/5 P! 4-/5  Shoulder extension 4/5 P! 4/5  Shoulder abduction 4-/5 P! 4-/5  Shoulder adduction    Shoulder internal rotation 4-/5 4/5  Shoulder external rotation 4-/5 4/5  Middle trapezius    Lower trapezius    Elbow flexion    Elbow extension    Wrist flexion    Wrist extension    Wrist ulnar deviation    Wrist radial deviation    Wrist pronation    Wrist supination    Grip strength     (Blank rows = not tested)   LUMBAR ROM:   AROM eval  Flexion 50  Extension 10  Right lateral flexion 20  Left lateral flexion 20 *  Right rotation   Left rotation    (Blank rows = not tested) *Rlateral side bend reproduced concordant symptoms     LOWER EXTREMITY ROM:     Active  Right eval Left eval  Hip flexion    Hip extension    Hip abduction    Hip adduction    Hip internal rotation    Hip external rotation    Knee flexion    Knee extension    Ankle dorsiflexion    Ankle plantarflexion    Ankle inversion    Ankle eversion     (Blank rows = not tested)  LOWER EXTREMITY MMT:    MMT Right eval Left eval  Hip flexion 3+/5 * 4/5  Hip extension    Hip abduction 3+/5 3+/5  Hip adduction 4/5 4+/5  Hip internal rotation    Hip external rotation    Knee flexion    Knee extension    Ankle dorsiflexion    Ankle plantarflexion    Ankle inversion    Ankle eversion     (Blank rows = not tested) (*= pain during testing)  LUMBAR SPECIAL TESTS:     Repeated extension in standing increased RLE pain Repated felxion in standing no worse in pain.   FUNCTIONAL TESTS:  5 times sit to stand: 13 sec  GAIT: Distance walked: 120 ft from waiting room to exam room Assistive device utilized: None Level of assistance: Complete Independence Comments: No abnormal movements  noted  TODAY'S TREATMENT:   [facility] Adult PT Treatment:                                                DATE: 12-17-22 Aquatic therapy at [place] therapeutic pool temp 92 degrees Pt enters building with dtr and interpreter. Treatment took place in water 3.8 to  4 ft 8 .feet deep depending upon activity.  Pt entered and exited the pool via stair and handrails independently.    Pain at 4/10 at beginning of session.  3/10 at end of session. Ambulating back and forth to acclimate to water and warm up  Aquatic Exercise: using aquatic fins to add resistance in water to BIL LE with all below except Ai CHI  Heel/ toe raises BIL   Below using aquatic  fins for added resistance VC and TC in water for correct execution and posture with VC and TC for correct execution Hip ext/flex with knee straight R x 20   Hamstring curl x20 BIL  Runners stretch x30" BIL Hamstring stretch x30" BIL Hip circles CW/CCW x10 BIL   Hip abduction/adduction x10 BIL  Reverse Lunge 2 x 10 with UE support Figure 4 squat stretch x30" BIL SL squat piriformis stretch 60" hold RT and then LT Squats x 20 with BIL UE support Using pool noodle for gentle lumbar rotation to RT and LT x 10 Triangle pose x 10 Cat cow with pool noodle On submerged bench Bicycle kicks x1' Reverse bicycle kicks x1' Flutter kicks x1' Scissor kicks x 1'  Ai Chi  HEP with demo and encouraged  deep breathing and pain control using slow controlled Tai chi like movements with shoulders submerged. Ai Chi  HEP given and laminated to take to pool with pt.  Pt educated through interpreter encouraged  deep breathing and pain control using slow box breathing.   Emphasis on core and  abdominal engagement .    Ai Chi postures indicated below for 8-10 reps to RT and LT with PT guiding  with VC/ TC  cues given to keep core tight for improved trunk stabilization. contemplating          Floating  Uplifting  Enclosing Folding  Soothing   Gathering Freeing motion  Shifting Accepting  Pt requires the buoyancy of water for active assisted exercises with buoyancy supported for strengthening and AROM exercises. Hydrostatic pressure also supports joints by unweighting joint load by at least 50 % in 3-4 feet depth water.  Water will allow for  reduced joint loading through buoyancy to help patient improve posture without excess stress and pain. Water current provides perturbations which challenge standing balance unsupported.                                                                                                          [facility] Adult PT Treatment:                                                DATE: 12/15/2022 Therapeutic Exercise: Treadmill x 3 min on incline L3 x 1.5 (increased RLE / calf pain) Progressed to recumbent bike after treadmill L3 x 5 min (improved RLE calf pain) R upper trap stretch 1 x 30  Dead bug 1 x 5 holding 10 seconds combined with PPT requiring TC for activation LTR 1 x 10 Supine R hip flexor stretch  2 x 30    Reviewed exercises and benefits of using a flexion based exercise Self Care: Time was taken to review spinal anatomy and effects of stenosis on her ADLs and impact of extension causing RLE calf pain, and beneftis of flexion. Discussed that only way to truly assess it is MRI and discussing with MD plans moving forward.  [facility] Adult PT Treatment:                                                DATE: 12-10-22 Aquatic therapy at [place] pool temp 91 degrees Pt enters building with dtr and interpreter. Treatment took place in water 3.8 to  4 ft 8 .feet deep depending upon activity.  Pt entered and exited the pool via stair and handrails independently.   Ms. Seidle entered water for aquatic therapy for first time and was introduced to principles and therapeutic effects of water as she ambulated and acclimated to pool.     Pain at 5/10 at beginning of  session Ambulating back and forth to acclimate to water and warm up   Aquatic Exercise:  Began with UE with aquatic hand bells for resistance with BIl horizontal abd/add, Bil abduction/add, Bil flex/ext x 20 each Using pool noodle for gentle lumbar rotation to RT and LT x 10 Heel/ toe raises BIL   Below using aquatic fins for added resistance VC and TC in water for correct execution and posture Hip ext/flex with knee straight R x 20   Hamstring curl x20 BIL  Hip circles CW/CCW x10 BIL   Hip abduction/adduction x10 BIL  Lunge to Target exercise with VC for upright posture Figure 4 squat stretch x30" BIL Squats x 20 with BIL UE support Treading water for 15 sec x 3 with PT CGA x 1  On submerged bench Bicycle kicks x1' Reverse bicycle kicks x1' Flutter kicks x1' Scissor kicks x 1'  [place]az, Pt with lumbar belt around hips and inflatable neck collar for neck support.   Pt assisted into supine floating position by lying head on shoulder of PT to get into floating position. PT at torso and assisting with trunk left to right and vice versa to engage trunk muscles. PT then rotated trunk in order to engage abdominal (internal and external obliques) Emphasis on breathing techniques to draw in abdominals for support.  Pt then utilizing posterior chain and engaging Hip extension and knee flexion with water resistance while PT used  LAD in water with pt supine floating.  Pt with 0/10 pain at end of session  Jogging across pool x 4 Backward jogging across pool x 2 Pt requires the buoyancy of water for active assisted exercises with buoyancy supported for strengthening and AROM exercises. Hydrostatic pressure also supports joints by unweighting joint load by at least 50 % in 3-4 feet depth water. 80% in chest to neck deep water. Water will provide assistance with movement using the current and laminar flow while the buoyancy reduces weight bearing. Pt requires the viscosity of the water for resistance  with strengthening exercises.   [facility] Adult PT Treatment:                                                DATE: 12/08/2022 Therapeutic Exercise: Nu-step L5 x 5 min UE/LE Posterior pelvic tilt 1 x 5 holding 10 seconds, progressed to alternating L/R LE marching  3 x 12 while maintinaining sustained posterior pelvic tilt LTR 2 x 10 (performed between supine core strengthening sets) Lower trap strengthening with elbows propped on wall 2  x 10 with RTB (modified ot YTB due to difficulty) R upper trap 2 x 30 sec stretch (grabbing onto table with RUE to help with scapular depression) Manual Therapy: IASTM along the R upper trap/ cervical parapsinals T1-T8 PA grade III Bil first rib mobs  Trigger Point Dry-Needling  Treatment instructions: Expect mild to moderate muscle soreness. S/S of pneumothorax if dry needled over a lung field, and to seek immediate medical attention should they occur. Patient verbalized understanding of these instructions and education.  Patient Consent Given: Yes Education handout provided: Yes Muscles treated: R cervical mulifidi and R upper trap/ levator scapuale Electrical stimulation performed: No Parameters: N/A Treatment response/outcome: twitch response noted.  Self Care: Reviewed sleeping posture with keeping head in neutral position to prevent upper trap from getting too tight from excessive shortening. Pt verbalized understanding   PATIENT EDUCATION:  Education details: evaluation findings, POC, goals, HEP with proper form/ rationale. Handout for basic pool exercises. [place] handout. Person educated: Patient Education method: Explanation, Verbal cues, and Handouts Education comprehension: verbalized understanding  HOME EXERCISE PROGRAM: Access Code: [email or web address] URL: [email or web address] Date: 12/01/2022 Prepared by: Starr Lake  Exercises - Seated Upper Trapezius Stretch  - 3 x daily - 7 x weekly - 2 sets - 2 reps - 30 seconds  hold - Gentle Levator Scapulae Stretch (Mirrored)  - 1 x daily - 7 x weekly - 2 sets - 2 reps - 30 seconds hold - Seated Flexion Stretch  - 1 x daily - 7 x weekly - 2 sets - 10 reps - Seated Single Knee to Chest  - 1 x daily - 7 x weekly - 2 sets - 30 sec hold - Hooklying Single Knee to Chest  - 1 x daily - 7 x weekly - 2 sets - 10 reps - Seated Piriformis Stretch  - 1 x daily - 7 x weekly - 2 sets - 10 reps - Supine March  - 1 x daily - 7 x weekly - 2 sets - 10 reps - 5 hold - Child's Pose Stretch  - 2 x daily - 7 x weekly - 2 sets - 2 reps - 30 hold - Cat Cow to Child's Pose  - 1 x daily - 7 x weekly - 2 sets - 10 reps  ASSESSMENT:  CLINICAL IMPRESSION: Ms Rhoten enters water with 4/10 pain and reduces to 3/10 at end of session.  Pt also given HEP for basic exercise and a laminated version of Ai chi for strength and pain control.  Ms Cranmer with interpreter also educated on box breathing and activating parasympathetic system with deep breathing and slow exhalation.  Pt benefits from skilled PT to help pt perform maximal range without painfree motion.  Pt requires the buoyancy of water for active assisted exercises with buoyancy supported for strengthening and AROM exercises.  Water will allow for  reduced joint loading through buoyancy to help patient improve posture without excess stress and pain. Water current provides perturbations which challenge standing balance unsupported.     OBJECTIVE IMPAIRMENTS: decreased activity tolerance, decreased endurance, decreased ROM, decreased strength, hypomobility, impaired sensation, postural dysfunction, and pain.   ACTIVITY LIMITATIONS: carrying, lifting, bending, sitting, standing, and squatting  PARTICIPATION LIMITATIONS: meal prep, cleaning, laundry, shopping, community activity, and yard work  PERSONAL FACTORS: Past/current experiences and 1 comorbidity: Pre-diabetes  are also affecting patient's functional outcome.   REHAB POTENTIAL:  Good  CLINICAL DECISION MAKING: Evolving/moderate complexity  EVALUATION COMPLEXITY: Moderate   GOALS: Goals  reviewed with patient? Yes  SHORT TERM GOALS: Target date: 12/15/2022    Pt to be IND with initial HEP Baseline: Goal status: MET for aquatics  2.  Pt to report decreased RLE referred symptoms  and RLE cramping to </= 4 x per week to demo improvement in condition Baseline:  Goal status: INITIAL  3.  Pt be able to perform house hold activities with report of </=min difficulty per FOTO assessment to demo improving function Baseline:  Goal status: INITIAL  4.  Pt to report reduce tension in the R upper trap and report pain reduction to </= 4/10  Baseline:  Goal status: INITIAL  LONG TERM GOALS: Target date: 01/12/2023    Increase RUE gross strength to >/= 4/5 to promote scapulohumeral rhythm and reduce upper trap dominance.  Baseline:  Goal status: INITIAL  2.  Increase RLE strength to >/= 4/5 to promote stability with standing/ walking and reduce tension in the low back Baseline:  Goal status: INITIAL  3.  Pt to report RLE referred pain and spams to </=4 x in 2 week time frame for improvement in condition. Baseline:  Goal status: INITIAL  4.  Increase FOTO score to 66% to demo improvement in function Baseline:  Goal status: INITIAL  5.  Pt to be able to perform cervical and RUE ROM required for ADLs with </= 3/10 max  Baseline:  Goal status: INITIAL  6.  Pt to be IND with all HEP to be able to maintain and progress current LOF Baseline:  Goal status: INITIAL  PLAN:  PT FREQUENCY: 1-2x/week  PT DURATION: 8 weeks  PLANNED INTERVENTIONS: Therapeutic exercises, Therapeutic activity, Neuromuscular re-education, Balance training, Gait training, Patient/Family education, Self Care, Joint mobilization, Joint manipulation, Stair training, Aquatic Therapy, Dry Needling, Electrical stimulation, Spinal manipulation, Spinal mobilization, Cryotherapy, Moist heat,  Taping, Traction, Ultrasound, Ionotophoresis 4mg /ml Dexamethasone, Manual therapy, and Re-evaluation.  PLAN FOR NEXT SESSION: Review/ update HEP PRN. Flexion biased exercises for back, core strengthening, gross UE/ LE strengthening. STW consider TPDN for R upper trap/ levator and R thoracolumbar paraspinals. Trial manual traction if tolerated well then follow with mechanical traction.   Voncille Lo, PT, [place] Certified Exercise Expert for the Aging Adult  12/17/22 2:12 PM Phone: [phone] Fax: [phone]

## 2022-12-17 ENCOUNTER — Encounter: Payer: Self-pay | Admitting: Physical Therapy

## 2022-12-17 ENCOUNTER — Ambulatory Visit: Payer: Commercial Managed Care - HMO | Admitting: Physical Therapy

## 2022-12-17 DIAGNOSIS — M542 Cervicalgia: Secondary | ICD-10-CM

## 2022-12-17 DIAGNOSIS — M5416 Radiculopathy, lumbar region: Secondary | ICD-10-CM | POA: Diagnosis not present

## 2022-12-17 DIAGNOSIS — G8929 Other chronic pain: Secondary | ICD-10-CM

## 2022-12-17 DIAGNOSIS — R279 Unspecified lack of coordination: Secondary | ICD-10-CM

## 2022-12-17 DIAGNOSIS — M62838 Other muscle spasm: Secondary | ICD-10-CM

## 2022-12-17 DIAGNOSIS — M6281 Muscle weakness (generalized): Secondary | ICD-10-CM

## 2022-12-17 DIAGNOSIS — R293 Abnormal posture: Secondary | ICD-10-CM

## 2022-12-22 ENCOUNTER — Ambulatory Visit (INDEPENDENT_AMBULATORY_CARE_PROVIDER_SITE_OTHER): Payer: Commercial Managed Care - HMO | Admitting: Family Medicine

## 2022-12-22 ENCOUNTER — Encounter: Payer: Self-pay | Admitting: Family Medicine

## 2022-12-22 ENCOUNTER — Ambulatory Visit: Payer: Commercial Managed Care - HMO | Admitting: Physical Therapy

## 2022-12-22 VITALS — BP 122/76 | HR 80 | Temp 98.1°F | Resp 20 | Ht 61.5 in | Wt 189.0 lb

## 2022-12-22 DIAGNOSIS — J029 Acute pharyngitis, unspecified: Secondary | ICD-10-CM | POA: Diagnosis not present

## 2022-12-22 DIAGNOSIS — J0140 Acute pansinusitis, unspecified: Secondary | ICD-10-CM

## 2022-12-22 DIAGNOSIS — R6889 Other general symptoms and signs: Secondary | ICD-10-CM

## 2022-12-22 LAB — POCT INFLUENZA A/B
Influenza A, POC: NEGATIVE
Influenza B, POC: NEGATIVE

## 2022-12-22 LAB — POC COVID19 BINAXNOW: SARS Coronavirus 2 Ag: NEGATIVE

## 2022-12-22 LAB — POCT RAPID STREP A (OFFICE): Rapid Strep A Screen: NEGATIVE

## 2022-12-22 MED ORDER — AZITHROMYCIN 250 MG PO TABS: ORAL_TABLET | ORAL | 0 refills | Status: AC

## 2022-12-22 NOTE — Patient Instructions (Signed)
Throat lozenges, chloraseptic spray, warm salt water gargles, hot tea/honey, cough syrup (Delsym), Tylenol/Ibuprofen, Dayquil, Nyquil, Vicks, and a humidifier at night.

## 2022-12-22 NOTE — Progress Notes (Signed)
Assessment & Plan:  1. Acute non-recurrent pansinusitis Education provided on sinusitis. Encouraged symptom management including throat lozenges, chloraseptic spray, warm salt water gargles, hot tea/honey, cough syrup (Delsym), Tylenol/Ibuprofen, Dayquil, Nyquil, Vicks, and a humidifier at night.  - azithromycin (ZITHROMAX) 250 MG tablet; Take 2 tablets (500 mg total) by mouth daily for 1 day, THEN 1 tablet (250 mg total) daily for 4 days.  Dispense: 6 each; Refill: 0  2. Flu-like symptoms - POC COVID-19 BinaxNow - POCT Influenza A/B  3. Sore throat - POC COVID-19 BinaxNow - POCT rapid strep A  Results for orders placed or performed in visit on 12/22/22  POC COVID-19 BinaxNow  Result Value Ref Range   SARS Coronavirus 2 Ag Negative Negative  POCT Influenza A/B  Result Value Ref Range   Influenza A, POC Negative Negative   Influenza B, POC Negative Negative  POCT rapid strep A  Result Value Ref Range   Rapid Strep A Screen Negative Negative    Follow up plan: Return if symptoms worsen or fail to improve.  Hendricks Limes, MSN, APRN, FNP-C  Subjective:  HPI: Kendra Rodriguez is a 57 y.o. female presenting on 12/22/2022 for Cough (Cough, congestion, ST, burning around eyes - started Sat. )  Patient is accompanied by her daughter, who she is okay with being present. She complains of cough, head congestion, runny nose, sore throat, postnasal drainage, shortness of breath, and wheezing. She denies chest congestion, headache, sneezing, fever, nausea, vomiting, abdominal pain, and diarrhea. Onset of symptoms was 2 days ago, unchanged since that time. She is drinking plenty of fluids. Evaluation to date: none. Treatment to date: antihistamines, nasal steroids, and Tessalon Perles . She does not smoke.    ROS: Negative unless specifically indicated above in HPI.   Relevant past medical history reviewed and updated as indicated.   Allergies and medications reviewed and  updated.   Current Outpatient Medications:    atorvastatin (LIPITOR) 40 MG tablet, TAKE 1 TABLET(40 MG) BY MOUTH DAILY, Disp: 90 tablet, Rfl: 3   benzonatate (TESSALON) 200 MG capsule, Take 1 capsule (200 mg total) by mouth 2 (two) times daily as needed for cough., Disp: 20 capsule, Rfl: 0   cetirizine (ZYRTEC ALLERGY) 10 MG tablet, Take 1 tablet (10 mg total) by mouth daily., Disp: 30 tablet, Rfl: 1   fluticasone (FLONASE) 50 MCG/ACT nasal spray, Place 1 spray into both nostrils daily., Disp: 16 g, Rfl: 2   levothyroxine (SYNTHROID) 50 MCG tablet, TAKE 1 TABLET(50 MCG) BY MOUTH DAILY, Disp: 90 tablet, Rfl: 3   pantoprazole (PROTONIX) 40 MG tablet, Take 40 mg by mouth daily before breakfast., Disp: , Rfl:   No Known Allergies  Objective:   BP 122/76   Pulse 80   Temp 98.1 F (36.7 C)   Resp 20   Ht 5' 1.5" (1.562 m)   Wt 189 lb (85.7 kg)   BMI 35.13 kg/m    Physical Exam Vitals reviewed.  Constitutional:      General: She is not in acute distress.    Appearance: Normal appearance. She is not ill-appearing, toxic-appearing or diaphoretic.  HENT:     Head: Normocephalic and atraumatic.     Right Ear: Ear canal and external ear normal. There is no impacted cerumen. Tympanic membrane is bulging. Tympanic membrane is not injected, scarred, perforated or erythematous.     Left Ear: Tympanic membrane, ear canal and external ear normal. There is no impacted cerumen.     Nose:  Congestion present. No rhinorrhea.     Right Sinus: Maxillary sinus tenderness and frontal sinus tenderness present.     Left Sinus: Maxillary sinus tenderness and frontal sinus tenderness present.     Mouth/Throat:     Mouth: Mucous membranes are moist.     Pharynx: Oropharynx is clear. Posterior oropharyngeal erythema present. No oropharyngeal exudate.  Eyes:     General: No scleral icterus.       Right eye: No discharge.        Left eye: No discharge.     Conjunctiva/sclera: Conjunctivae normal.   Cardiovascular:     Rate and Rhythm: Normal rate and regular rhythm.     Heart sounds: Normal heart sounds. No murmur heard.    No friction rub. No gallop.  Pulmonary:     Effort: Pulmonary effort is normal. No respiratory distress.     Breath sounds: Normal breath sounds. No stridor. No wheezing, rhonchi or rales.  Musculoskeletal:        General: Normal range of motion.     Cervical back: Normal range of motion.  Lymphadenopathy:     Cervical: Cervical adenopathy present.  Skin:    General: Skin is warm and dry.     Capillary Refill: Capillary refill takes less than 2 seconds.  Neurological:     General: No focal deficit present.     Mental Status: She is alert and oriented to person, place, and time. Mental status is at baseline.  Psychiatric:        Mood and Affect: Mood normal.        Behavior: Behavior normal.        Thought Content: Thought content normal.        Judgment: Judgment normal.

## 2022-12-23 NOTE — Therapy (Incomplete)
OUTPATIENT PHYSICAL THERAPY TREATMENT   Patient Name: Kendra Rodriguez MRN: [id number] DOB:1966/01/11, 57 y.o., female Today's Date: 12/23/2022  END OF SESSION:        Past Medical History:  Diagnosis Date   Acute sinusitis    Allergy    Anxiety    Depression    NO MEDICATIONS - DOING OK NOW   Dizziness    Ear ache    RIGHT --HX OF MULTIPLE EAR INFECTIONS AS A CHILD    GERD (gastroesophageal reflux disease)    Headache(784.0)    MIGRAINES   Leg pain, left    with swelling   Lumbar stenosis    PAIN BACK AND LEFT LEG AND NUMBNESS LEG AND FOOT   Peripheral vascular disease (HCC)    HX OF TREATMENT FOR VARICOSE VEINS RIGHT LEG   Thyroid disease    TOLD BLOOD LEVELS SLIGHTLY ABNORMAL - BUT NOT REQUIRED MEDICATION   Past Surgical History:  Procedure Laterality Date   BACK SURGERY     bladder lift     AT SAME TIME OF HYSTERECTOMY   BRAVO [place] STUDY N/A 07/26/2021   Procedure: BRAVO [place] STUDY;  Surgeon: Carol Ada, MD;  Location: [facility];  Service: Endoscopy;  Laterality: N/A;   CHOLECYSTECTOMY     ESOPHAGOGASTRODUODENOSCOPY (EGD) WITH PROPOFOL N/A 07/26/2021   Procedure: ESOPHAGOGASTRODUODENOSCOPY (EGD) WITH PROPOFOL;  Surgeon: Carol Ada, MD;  Location: [facility];  Service: Endoscopy;  Laterality: N/A;   LUMBAR LAMINECTOMY/DECOMPRESSION MICRODISCECTOMY Left 10/24/2014   Procedure: COMPLETE LUMBAR DECOMPRESSION L4-L5 CENTRAL AND MICRODISCECTOMY L4-L5 LEFT;  Surgeon: Tobi Bastos, MD;  Location: [facility];  Service: Orthopedics;  Laterality: Left;   POLYPECTOMY  07/26/2021   Procedure: POLYPECTOMY;  Surgeon: Carol Ada, MD;  Location: [facility];  Service: Endoscopy;;   VAGINAL HYSTERECTOMY     Patient Active Problem List   Diagnosis Date Noted   Primary osteoarthritis of left knee 09/08/2022   Vitamin D deficiency 09/08/2022   Hypothyroidism 11/29/2021   Body mass index (BMI) of 35.0-35.9 in adult 06/04/2020   History of migraine headaches 12/06/2019    Dyslipidemia 06/30/2019   GERD with esophagitis 06/30/2019   Chronic vertigo 04/13/2018   Prediabetes 10/17/2015   Spinal stenosis, lumbar region, with neurogenic claudication 10/24/2014    PCP: Horald Pollen, MD   REFERRING PROVIDER: Gregor Hams, MD   REFERRING DIAG: Lumbar radiculopathy, right [M54.16], Trapezius muscle spasm [M62.838]   Rationale for Evaluation and Treatment: Rehabilitation  THERAPY DIAG:  No diagnosis found.  ONSET DATE: lumbar Radiculopathy 3 months, R upper trap chronically for over 5 years  SUBJECTIVE:  SUBJECTIVE STATEMENT: Pt enters water with  back pain at 4/10 pain and reports at worst this week 7/10 and at best 2/10.     PERTINENT HISTORY:  Hx of pre-diabetes.  PAIN:  Arm/ neck 2/10 worse with lifting/ carrying items in RUE  Back  4/10  Are you having pain? Yes: NPRS scale: 4/10 Pain location: low back, RLE  Pain description: pain, camping.  Aggravating factors: prolonged sitting, walking, cold  Relieving factors: getting up and moving  PRECAUTIONS: None  WEIGHT BEARING RESTRICTIONS: No  FALLS:  Has patient fallen in last 6 months? No  LIVING ENVIRONMENT: Lives with: lives with their family Lives in: House/apartment    OCCUPATION: Unemployed   PLOF: Independent with basic ADLs  PATIENT GOALS: decrease pain.  NEXT MD VISIT:   OBJECTIVE:   DIAGNOSTIC FINDINGS:  N/A  PATIENT SURVEYS:  FOTO 54% predicted 66% 12/15/2022 48%  SCREENING FOR RED FLAGS: Bowel or bladder incontinence: No   COGNITION: Overall cognitive status: Within functional limits for tasks assessed     SENSATION: Altered sensation on the RLE L1 & L5    POSTURE: rounded shoulders and forward head upper trap dominance noted throughout UE MMT/ ROM  assessment  PALPATION: Neck - TTP along the R upper trap/ levator scapulae with palpable trigger pointed reproducing concordant pain  Back- TTP along the R thoracolumbar paraspinals with trigger points noted.    CERVICAL ROM:   Active ROM A/PROM (deg) Eval  Flexion WFL P! PDM  Extension WFL P! ERP  Right lateral flexion WFL  Left lateral flexion WFL P! ERP  Right rotation WFL P! ERP  Left rotation WFL   (Blank rows = not tested)  UE ROM:  Active ROM Right Eval Left [place];  Shoulder flexion WFL * WFL  Shoulder extension [facility]  Shoulder abduction WFL * WFL  Shoulder adduction    Shoulder extension [facility]  Shoulder internal rotation    Shoulder external rotation    Elbow flexion    Elbow extension    Wrist flexion    Wrist extension    Wrist ulnar deviation    Wrist radial deviation    Wrist pronation    Wrist supination     (Blank rows = not tested) (pain during movement = *)  UE MMT:  MMT Right Eval Left Eval  Shoulder flexion 4-/5 P! 4-/5  Shoulder extension 4/5 P! 4/5  Shoulder abduction 4-/5 P! 4-/5  Shoulder adduction    Shoulder internal rotation 4-/5 4/5  Shoulder external rotation 4-/5 4/5  Middle trapezius    Lower trapezius    Elbow flexion    Elbow extension    Wrist flexion    Wrist extension    Wrist ulnar deviation    Wrist radial deviation    Wrist pronation    Wrist supination    Grip strength     (Blank rows = not tested)   LUMBAR ROM:   AROM eval  Flexion 50  Extension 10  Right lateral flexion 20  Left lateral flexion 20 *  Right rotation   Left rotation    (Blank rows = not tested) *Rlateral side bend reproduced concordant symptoms     LOWER EXTREMITY ROM:     Active  Right eval Left eval  Hip flexion    Hip extension    Hip abduction    Hip adduction    Hip internal rotation    Hip external rotation    Knee flexion  Knee extension    Ankle dorsiflexion    Ankle plantarflexion    Ankle inversion     Ankle eversion     (Blank rows = not tested)  LOWER EXTREMITY MMT:    MMT Right eval Left eval  Hip flexion 3+/5 * 4/5  Hip extension    Hip abduction 3+/5 3+/5  Hip adduction 4/5 4+/5  Hip internal rotation    Hip external rotation    Knee flexion    Knee extension    Ankle dorsiflexion    Ankle plantarflexion    Ankle inversion    Ankle eversion     (Blank rows = not tested) (*= pain during testing)  LUMBAR SPECIAL TESTS:     Repeated extension in standing increased RLE pain Repated felxion in standing no worse in pain.   FUNCTIONAL TESTS:  5 times sit to stand: 13 sec  GAIT: Distance walked: 120 ft from waiting room to exam room Assistive device utilized: None Level of assistance: Complete Independence Comments: No abnormal movements noted  TODAY'S TREATMENT:   [facility] Adult PT Treatment:                                                DATE: 12-17-22 Aquatic therapy at [place] therapeutic pool temp 92 degrees Pt enters building with dtr and interpreter. Treatment took place in water 3.8 to  4 ft 8 .feet deep depending upon activity.  Pt entered and exited the pool via stair and handrails independently.    Pain at 4/10 at beginning of session.  3/10 at end of session. Ambulating back and forth to acclimate to water and warm up  Aquatic Exercise: using aquatic fins to add resistance in water to BIL LE with all below except Ai CHI  Heel/ toe raises BIL   Below using aquatic fins for added resistance VC and TC in water for correct execution and posture with VC and TC for correct execution Hip ext/flex with knee straight R x 20   Hamstring curl x20 BIL  Runners stretch x30" BIL Hamstring stretch x30" BIL Hip circles CW/CCW x10 BIL   Hip abduction/adduction x10 BIL  Reverse Lunge 2 x 10 with UE support Figure 4 squat stretch x30" BIL SL squat piriformis stretch 60" hold RT and then LT Squats x 20 with BIL UE support Using pool noodle for  gentle lumbar rotation to RT and LT x 10 Triangle pose x 10 Cat cow with pool noodle On submerged bench Bicycle kicks x1' Reverse bicycle kicks x1' Flutter kicks x1' Scissor kicks x 1'  Ai Chi  HEP with demo and encouraged  deep breathing and pain control using slow controlled Tai chi like movements with shoulders submerged. Ai Chi  HEP given and laminated to take to pool with pt.  Pt educated through interpreter encouraged  deep breathing and pain control using slow box breathing.   Emphasis on core and  abdominal engagement .    Ai Chi postures indicated below for 8-10 reps to RT and LT with PT guiding  with VC/ TC  cues given to keep core tight for improved trunk stabilization. contemplating          Floating  Uplifting  Enclosing Folding  Soothing  Gathering Freeing motion  Shifting Accepting  Pt requires the buoyancy of water for  active assisted exercises with buoyancy supported for strengthening and AROM exercises. Hydrostatic pressure also supports joints by unweighting joint load by at least 50 % in 3-4 feet depth water.  Water will allow for  reduced joint loading through buoyancy to help patient improve posture without excess stress and pain. Water current provides perturbations which challenge standing balance unsupported.                                                                                                          [facility] Adult PT Treatment:                                                DATE: 12/15/2022 Therapeutic Exercise: Treadmill x 3 min on incline L3 x 1.5 (increased RLE / calf pain) Progressed to recumbent bike after treadmill L3 x 5 min (improved RLE calf pain) R upper trap stretch 1 x 30  Dead bug 1 x 5 holding 10 seconds combined with PPT requiring TC for activation LTR 1 x 10 Supine R hip flexor stretch  2 x 30    Reviewed exercises and benefits of using a flexion based exercise Self Care: Time was taken to review spinal anatomy and effects of  stenosis on her ADLs and impact of extension causing RLE calf pain, and beneftis of flexion. Discussed that only way to truly assess it is MRI and discussing with MD plans moving forward.    [facility] Adult PT Treatment:                                                DATE: 12-10-22 Aquatic therapy at [place] pool temp 91 degrees Pt enters building with dtr and interpreter. Treatment took place in water 3.8 to  4 ft 8 .feet deep depending upon activity.  Pt entered and exited the pool via stair and handrails independently.   Ms. Flohr entered water for aquatic therapy for first time and was introduced to principles and therapeutic effects of water as she ambulated and acclimated to pool.     Pain at 5/10 at beginning of session Ambulating back and forth to acclimate to water and warm up   Aquatic Exercise:  Began with UE with aquatic hand bells for resistance with BIl horizontal abd/add, Bil abduction/add, Bil flex/ext x 20 each Using pool noodle for gentle lumbar rotation to RT and LT x 10 Heel/ toe raises BIL   Below using aquatic fins for added resistance VC and TC in water for correct execution and posture Hip ext/flex with knee straight R x 20   Hamstring curl x20 BIL  Hip circles CW/CCW x10 BIL   Hip abduction/adduction x10 BIL  Lunge to Target exercise with VC for upright posture Figure 4 squat stretch  x30" BIL Squats x 20 with BIL UE support Treading water for 15 sec x 3 with PT CGA x 1  On submerged bench Bicycle kicks x1' Reverse bicycle kicks x1' Flutter kicks x1' Scissor kicks x 1'  [place], Pt with lumbar belt around hips and inflatable neck collar for neck support.   Pt assisted into supine floating position by lying head on shoulder of PT to get into floating position. PT at torso and assisting with trunk left to right and vice versa to engage trunk muscles. PT then rotated trunk in order to engage abdominal (internal and external  obliques) Emphasis on breathing techniques to draw in abdominals for support.  Pt then utilizing posterior chain and engaging Hip extension and knee flexion with water resistance while PT used  LAD in water with pt supine floating.  Pt with 0/10 pain at end of session  Jogging across pool x 4 Backward jogging across pool x 2 Pt requires the buoyancy of water for active assisted exercises with buoyancy supported for strengthening and AROM exercises. Hydrostatic pressure also supports joints by unweighting joint load by at least 50 % in 3-4 feet depth water. 80% in chest to neck deep water. Water will provide assistance with movement using the current and laminar flow while the buoyancy reduces weight bearing. Pt requires the viscosity of the water for resistance with strengthening exercises.   [facility] Adult PT Treatment:                                                DATE: 12/08/2022 Therapeutic Exercise: Nu-step L5 x 5 min UE/LE Posterior pelvic tilt 1 x 5 holding 10 seconds, progressed to alternating L/R LE marching  3 x 12 while maintinaining sustained posterior pelvic tilt LTR 2 x 10 (performed between supine core strengthening sets) Lower trap strengthening with elbows propped on wall 2 x 10 with RTB (modified ot YTB due to difficulty) R upper trap 2 x 30 sec stretch (grabbing onto table with RUE to help with scapular depression) Manual Therapy: IASTM along the R upper trap/ cervical parapsinals T1-T8 PA grade III Bil first rib mobs  Trigger Point Dry-Needling  Treatment instructions: Expect mild to moderate muscle soreness. S/S of pneumothorax if dry needled over a lung field, and to seek immediate medical attention should they occur. Patient verbalized understanding of these instructions and education.  Patient Consent Given: Yes Education handout provided: Yes Muscles treated: R cervical mulifidi and R upper trap/ levator scapuale Electrical stimulation performed: No Parameters:  N/A Treatment response/outcome: twitch response noted.  Self Care: Reviewed sleeping posture with keeping head in neutral position to prevent upper trap from getting too tight from excessive shortening. Pt verbalized understanding   PATIENT EDUCATION:  Education details: evaluation findings, POC, goals, HEP with proper form/ rationale. Handout for basic pool exercises. [place] handout. Person educated: Patient Education method: Explanation, Verbal cues, and Handouts Education comprehension: verbalized understanding  HOME EXERCISE PROGRAM: Access Code: [email or web address] URL: [email or web address] Date: 12/01/2022 Prepared by: Starr Lake  Exercises - Seated Upper Trapezius Stretch  - 3 x daily - 7 x weekly - 2 sets - 2 reps - 30 seconds hold - Gentle Levator Scapulae Stretch (Mirrored)  - 1 x daily - 7 x weekly - 2 sets - 2 reps - 30 seconds hold - Seated Flexion Stretch  -  1 x daily - 7 x weekly - 2 sets - 10 reps - Seated Single Knee to Chest  - 1 x daily - 7 x weekly - 2 sets - 30 sec hold - Hooklying Single Knee to Chest  - 1 x daily - 7 x weekly - 2 sets - 10 reps - Seated Piriformis Stretch  - 1 x daily - 7 x weekly - 2 sets - 10 reps - Supine March  - 1 x daily - 7 x weekly - 2 sets - 10 reps - 5 hold - Child's Pose Stretch  - 2 x daily - 7 x weekly - 2 sets - 2 reps - 30 hold - Cat Cow to Child's Pose  - 1 x daily - 7 x weekly - 2 sets - 10 reps  ASSESSMENT:  CLINICAL IMPRESSION: Ms Coveney enters water with 4/10 pain and reduces to 3/10 at end of session.  Pt also given HEP for basic exercise and a laminated version of Ai chi for strength and pain control.  Ms Nissen with interpreter also educated on box breathing and activating parasympathetic system with deep breathing and slow exhalation.  Pt benefits from skilled PT to help pt perform maximal range without painfree motion.  Pt requires the buoyancy of water for active assisted exercises with buoyancy  supported for strengthening and AROM exercises.  Water will allow for  reduced joint loading through buoyancy to help patient improve posture without excess stress and pain. Water current provides perturbations which challenge standing balance unsupported.     OBJECTIVE IMPAIRMENTS: decreased activity tolerance, decreased endurance, decreased ROM, decreased strength, hypomobility, impaired sensation, postural dysfunction, and pain.   ACTIVITY LIMITATIONS: carrying, lifting, bending, sitting, standing, and squatting  PARTICIPATION LIMITATIONS: meal prep, cleaning, laundry, shopping, community activity, and yard work  PERSONAL FACTORS: Past/current experiences and 1 comorbidity: Pre-diabetes  are also affecting patient's functional outcome.   REHAB POTENTIAL: Good  CLINICAL DECISION MAKING: Evolving/moderate complexity  EVALUATION COMPLEXITY: Moderate   GOALS: Goals reviewed with patient? Yes  SHORT TERM GOALS: Target date: 12/15/2022    Pt to be IND with initial HEP Baseline: Goal status: MET for aquatics  2.  Pt to report decreased RLE referred symptoms  and RLE cramping to </= 4 x per week to demo improvement in condition Baseline:  Goal status: INITIAL  3.  Pt be able to perform house hold activities with report of </=min difficulty per FOTO assessment to demo improving function Baseline:  Goal status: INITIAL  4.  Pt to report reduce tension in the R upper trap and report pain reduction to </= 4/10  Baseline:  Goal status: INITIAL  LONG TERM GOALS: Target date: 01/12/2023    Increase RUE gross strength to >/= 4/5 to promote scapulohumeral rhythm and reduce upper trap dominance.  Baseline:  Goal status: INITIAL  2.  Increase RLE strength to >/= 4/5 to promote stability with standing/ walking and reduce tension in the low back Baseline:  Goal status: INITIAL  3.  Pt to report RLE referred pain and spams to </=4 x in 2 week time frame for improvement in  condition. Baseline:  Goal status: INITIAL  4.  Increase FOTO score to 66% to demo improvement in function Baseline:  Goal status: INITIAL  5.  Pt to be able to perform cervical and RUE ROM required for ADLs with </= 3/10 max  Baseline:  Goal status: INITIAL  6.  Pt to be IND with all HEP to be  able to maintain and progress current LOF Baseline:  Goal status: INITIAL  PLAN:  PT FREQUENCY: 1-2x/week  PT DURATION: 8 weeks  PLANNED INTERVENTIONS: Therapeutic exercises, Therapeutic activity, Neuromuscular re-education, Balance training, Gait training, Patient/Family education, Self Care, Joint mobilization, Joint manipulation, Stair training, Aquatic Therapy, Dry Needling, Electrical stimulation, Spinal manipulation, Spinal mobilization, Cryotherapy, Moist heat, Taping, Traction, Ultrasound, Ionotophoresis 34m/ml Dexamethasone, Manual therapy, and Re-evaluation.  PLAN FOR NEXT SESSION: Review/ update HEP PRN. Flexion biased exercises for back, core strengthening, gross UE/ LE strengthening. STW consider TPDN for R upper trap/ levator and R thoracolumbar paraspinals. Trial manual traction if tolerated well then follow with mechanical traction.   ***

## 2022-12-24 ENCOUNTER — Ambulatory Visit: Payer: Commercial Managed Care - HMO | Admitting: Physical Therapy

## 2022-12-30 ENCOUNTER — Encounter: Payer: Self-pay | Admitting: Emergency Medicine

## 2022-12-31 ENCOUNTER — Other Ambulatory Visit: Payer: Self-pay | Admitting: Emergency Medicine

## 2022-12-31 DIAGNOSIS — F418 Other specified anxiety disorders: Secondary | ICD-10-CM

## 2022-12-31 MED ORDER — ALPRAZOLAM 0.5 MG PO TABS: 0.5000 mg | ORAL_TABLET | Freq: Every day | ORAL | 1 refills | Status: AC | PRN

## 2023-02-04 ENCOUNTER — Ambulatory Visit
Admission: EM | Admit: 2023-02-04 | Discharge: 2023-02-04 | Disposition: A | Discharge status: Home / Self Care | Payer: Commercial Managed Care - HMO | Attending: Urgent Care | Admitting: Urgent Care

## 2023-02-04 DIAGNOSIS — J309 Allergic rhinitis, unspecified: Secondary | ICD-10-CM

## 2023-02-04 DIAGNOSIS — H6991 Unspecified Eustachian tube disorder, right ear: Secondary | ICD-10-CM

## 2023-02-04 DIAGNOSIS — J0181 Other acute recurrent sinusitis: Secondary | ICD-10-CM | POA: Diagnosis not present

## 2023-02-04 MED ORDER — OLOPATADINE HCL 0.2 % OP SOLN: 1.0000 [drp] | Freq: Every day | OPHTHALMIC | 3 refills | Status: AC | PRN

## 2023-02-04 MED ORDER — FAMOTIDINE 20 MG PO TABS: 20.0000 mg | ORAL_TABLET | Freq: Two times a day (BID) | ORAL | 0 refills | Status: AC

## 2023-02-04 MED ORDER — CETIRIZINE HCL 10 MG PO TABS: 10.0000 mg | ORAL_TABLET | Freq: Every day | ORAL | 0 refills | Status: AC

## 2023-02-04 MED ORDER — PSEUDOEPHEDRINE HCL 30 MG PO TABS: 30.0000 mg | ORAL_TABLET | Freq: Three times a day (TID) | ORAL | 0 refills | Status: DC | PRN

## 2023-02-04 MED ORDER — AMOXICILLIN-POT CLAVULANATE 875-125 MG PO TABS: 1.0000 | ORAL_TABLET | Freq: Two times a day (BID) | ORAL | 0 refills | Status: DC

## 2023-02-04 NOTE — ED Provider Notes (Signed)
[facility]  Note:  This document was prepared using Systems analyst and may include unintentional dictation errors.  MRN: [id number] DOB: July 11, 1966  Subjective:   Kendra Rodriguez is a 57 y.o. female presenting for 2 day history of acute onset sinus congestion, sinus drainage, right ear pain, right sinus headache. Has had some vertigo, tinnitus of the right ear.  Has used Tylenol with very mild relief. No chest pain, shob, wheezing. Has a history of sinus infections, allergic rhinitis.  Has not taken anything for this currently.  No current facility-administered medications for this encounter.  Current Outpatient Medications:    ALPRAZolam (XANAX) 0.5 MG tablet, Take 1 tablet (0.5 mg total) by mouth daily as needed for anxiety., Disp: 20 tablet, Rfl: 1   atorvastatin (LIPITOR) 40 MG tablet, TAKE 1 TABLET(40 MG) BY MOUTH DAILY, Disp: 90 tablet, Rfl: 3   benzonatate (TESSALON) 200 MG capsule, Take 1 capsule (200 mg total) by mouth 2 (two) times daily as needed for cough., Disp: 20 capsule, Rfl: 0   cetirizine (ZYRTEC ALLERGY) 10 MG tablet, Take 1 tablet (10 mg total) by mouth daily., Disp: 30 tablet, Rfl: 1   fluticasone (FLONASE) 50 MCG/ACT nasal spray, Place 1 spray into both nostrils daily., Disp: 16 g, Rfl: 2   levothyroxine (SYNTHROID) 50 MCG tablet, TAKE 1 TABLET(50 MCG) BY MOUTH DAILY, Disp: 90 tablet, Rfl: 3   pantoprazole (PROTONIX) 40 MG tablet, Take 40 mg by mouth daily before breakfast., Disp: , Rfl:    No Known Allergies  Past Medical History:  Diagnosis Date   Acute sinusitis    Allergy    Anxiety    Depression    NO MEDICATIONS - DOING OK NOW   Dizziness    Ear ache    RIGHT --HX OF MULTIPLE EAR INFECTIONS AS A CHILD    GERD (gastroesophageal reflux disease)    Headache(784.0)    MIGRAINES   Leg pain, left    with swelling   Lumbar stenosis    PAIN BACK AND LEFT LEG AND NUMBNESS LEG AND FOOT   Peripheral vascular disease     HX OF TREATMENT FOR VARICOSE VEINS RIGHT LEG   Thyroid disease    TOLD BLOOD LEVELS SLIGHTLY ABNORMAL - BUT NOT REQUIRED MEDICATION     Past Surgical History:  Procedure Laterality Date   BACK SURGERY     bladder lift     AT SAME TIME OF HYSTERECTOMY   BRAVO [place] STUDY N/A 07/26/2021   Procedure: BRAVO [place] STUDY;  Surgeon: Carol Ada, MD;  Location: [facility];  Service: Endoscopy;  Laterality: N/A;   CHOLECYSTECTOMY     ESOPHAGOGASTRODUODENOSCOPY (EGD) WITH PROPOFOL N/A 07/26/2021   Procedure: ESOPHAGOGASTRODUODENOSCOPY (EGD) WITH PROPOFOL;  Surgeon: Carol Ada, MD;  Location: [facility];  Service: Endoscopy;  Laterality: N/A;   LUMBAR LAMINECTOMY/DECOMPRESSION MICRODISCECTOMY Left 10/24/2014   Procedure: COMPLETE LUMBAR DECOMPRESSION L4-L5 CENTRAL AND MICRODISCECTOMY L4-L5 LEFT;  Surgeon: Tobi Bastos, MD;  Location: [facility];  Service: Orthopedics;  Laterality: Left;   POLYPECTOMY  07/26/2021   Procedure: POLYPECTOMY;  Surgeon: Carol Ada, MD;  Location: [facility];  Service: Endoscopy;;   VAGINAL HYSTERECTOMY      Family History  Problem Relation Age of Onset   Hypertension Mother    Arthritis Mother    Diabetes Mother    Hyperlipidemia Mother    Arthritis Sister    Rheum arthritis Brother    Autoimmune disease Brother  Lung disease Brother    Arthritis Sister    Gout Son    High Cholesterol Daughter    Thyroid disease Neg Hx     Social History   Tobacco Use   Smoking status: Never   Smokeless tobacco: Never  Vaping Use   Vaping Use: Never used  Substance Use Topics   Alcohol use: No   Drug use: Never    ROS   Objective:   Vitals: BP (!) 147/81 (BP Location: Right Arm)   Pulse 64   Temp 98 F (36.7 C) (Oral)   Resp 18   SpO2 96%   BP Readings from Last 3 Encounters:  02/04/23 (!) 147/81  12/22/22 122/76  12/10/22 130/82   Physical Exam Constitutional:      General: She is not in acute distress.    Appearance: Normal  appearance. She is well-developed and normal weight. She is not ill-appearing, toxic-appearing or diaphoretic.  HENT:     Head: Normocephalic and atraumatic.     Right Ear: Ear canal and external ear normal. No drainage or tenderness. No middle ear effusion. There is no impacted cerumen. Tympanic membrane is not erythematous or bulging.     Left Ear: Tympanic membrane, ear canal and external ear normal. No drainage or tenderness.  No middle ear effusion. There is no impacted cerumen. Tympanic membrane is not erythematous or bulging.     Ears:     Comments: Opacified right TM.    Nose: Congestion present. No rhinorrhea.     Mouth/Throat:     Mouth: Mucous membranes are moist. No oral lesions.     Pharynx: No pharyngeal swelling, oropharyngeal exudate, posterior oropharyngeal erythema or uvula swelling.     Tonsils: No tonsillar exudate or tonsillar abscesses.  Eyes:     General: Lids are normal. Lids are everted, no foreign bodies appreciated. Vision grossly intact. No scleral icterus.       Right eye: No foreign body, discharge or hordeolum.        Left eye: No foreign body, discharge or hordeolum.     Extraocular Movements: Extraocular movements intact.     Right eye: Normal extraocular motion.     Left eye: Normal extraocular motion and no nystagmus.     Conjunctiva/sclera:     Right eye: Right conjunctiva is injected. No chemosis, exudate or hemorrhage.    Left eye: Left conjunctiva is injected. No chemosis, exudate or hemorrhage.    Pupils: Pupils are equal, round, and reactive to light.     Comments: Puffy eyelids.  Cardiovascular:     Rate and Rhythm: Normal rate and regular rhythm.     Heart sounds: Normal heart sounds. No murmur heard.    No friction rub. No gallop.  Pulmonary:     Effort: Pulmonary effort is normal. No respiratory distress.     Breath sounds: No stridor. No wheezing, rhonchi or rales.  Chest:     Chest wall: No tenderness.  Musculoskeletal:     Cervical  back: Normal range of motion and neck supple.  Lymphadenopathy:     Cervical: No cervical adenopathy.  Skin:    General: Skin is warm and dry.  Neurological:     General: No focal deficit present.     Mental Status: She is alert and oriented to person, place, and time.     Cranial Nerves: No cranial nerve deficit.     Motor: No weakness.     Coordination: Coordination normal.  Gait: Gait normal.  Psychiatric:        Mood and Affect: Mood normal.        Behavior: Behavior normal.     Assessment and Plan :   PDMP not reviewed this encounter.  1. Other acute recurrent sinusitis   2. Eustachian tube dysfunction, right   3. Allergic rhinitis, unspecified seasonality, unspecified trigger     Will start empiric treatment for sinusitis with Augmentin.  She has concurrent eustachian tube dysfunction and allergic rhinitis and conjunctivitis.  Advised against the nasal steroid for now.  Start Zyrtec and olopatadine eyedrops daily.  Use pseudoephedrine as needed, recommended supportive care otherwise.  No signs of an acute encephalopathy.  Suspect blood pressure elevation is secondary to her pain.  Counseled patient on potential for adverse effects with medications prescribed/recommended today, ER and return-to-clinic precautions discussed, patient verbalized understanding.    Jaynee Eagles, [place] 02/04/23 1202

## 2023-02-04 NOTE — ED Triage Notes (Signed)
Pt reports right sided headache, itching eyes, right ear pain and nasal congestion. Tylenol gives some relief.

## 2023-02-04 NOTE — Discharge Instructions (Addendum)
We will manage this as a sinus infection with Augmentin. For sore throat or cough try using a honey-based tea. Use 3 teaspoons of honey with juice squeezed from half lemon. Place shaved pieces of ginger into 1/2-1 cup of water and warm over stove top. Then mix the ingredients and repeat every 4 hours as needed. Please take ibuprofen 600mg  every 6 hours with food alternating with OR taken together with Tylenol 500mg -650mg  every 6 hours for throat pain, fevers, aches and pains. Hydrate very well with at least 2 liters of water. Eat light meals such as soups (chicken and noodles, vegetable, chicken and wild rice).  Do not eat foods that you are allergic to.  Taking an antihistamine like Zyrtec can help against postnasal drainage, sinus congestion which can cause sinus pain, sinus headaches, throat pain, painful swallowing, coughing.  You can take this together with pseudoephedrine (Sudafed) at a dose of 30 mg 3 times a day or twice daily as needed for the same kind of nasal drip, congestion.  Use cough medication as needed.

## 2023-03-02 ENCOUNTER — Ambulatory Visit
Admission: EM | Admit: 2023-03-02 | Discharge: 2023-03-02 | Disposition: A | Discharge status: Home / Self Care | Payer: Commercial Managed Care - HMO | Attending: Nurse Practitioner | Admitting: Nurse Practitioner

## 2023-03-02 DIAGNOSIS — R300 Dysuria: Secondary | ICD-10-CM | POA: Insufficient documentation

## 2023-03-02 LAB — POCT URINALYSIS DIP (MANUAL ENTRY)
Bilirubin, UA: NEGATIVE
Blood, UA: NEGATIVE
Glucose, UA: NEGATIVE mg/dL
Ketones, POC UA: NEGATIVE mg/dL
Leukocytes, UA: NEGATIVE
Nitrite, UA: NEGATIVE
Protein Ur, POC: NEGATIVE mg/dL
Spec Grav, UA: 1.01 (ref 1.010–1.025)
Urobilinogen, UA: 0.2 E.U./dL
pH, UA: 7 (ref 5.0–8.0)

## 2023-03-02 NOTE — Discharge Instructions (Signed)
The clinic will contact you with results of the urine culture if it is positive. Continue hydration you may use over-the-counter Azo as needed Start over-the-counter Tylenol or ibuprofen as needed for your low back pain Please follow-up with your PCP if your symptoms do not improve Please go to the ER for any worsening symptoms

## 2023-03-02 NOTE — ED Provider Notes (Signed)
[facility]    CSN: [id number] Arrival date & time: 03/02/23  1036      History   Chief Complaint Chief Complaint  Patient presents with   Dysuria   Back Pain    HPI Kendra Rodriguez is a 57 y.o. female presents for evaluation of dysuria.  Patient is accompanied by daughter who does help to translate as patient speaks [place] but also some [place].  Patient reports 4 days of some dysuria with urgency and frequency.  She also reports some lower right back pain that radiates around to her right lower quadrant.  This is intermittent.  She denies any hematuria, fevers, nausea/vomiting, flank pain.  No vaginal discharge or STD concern.  Patient reports history of chronic diarrhea of unknown origin.  States she is followed with GI and has multiple tests without a diagnosis.She has been taking Azo OTC for symptoms with relief of her dysuria.  She has not taken any OTC ibuprofen or Tylenol.  No other concerns at this time.   Dysuria Back Pain Associated symptoms: dysuria     Past Medical History:  Diagnosis Date   Acute sinusitis    Allergy    Anxiety    Depression    NO MEDICATIONS - DOING OK NOW   Dizziness    Ear ache    RIGHT --HX OF MULTIPLE EAR INFECTIONS AS A CHILD    GERD (gastroesophageal reflux disease)    Headache(784.0)    MIGRAINES   Leg pain, left    with swelling   Lumbar stenosis    PAIN BACK AND LEFT LEG AND NUMBNESS LEG AND FOOT   Peripheral vascular disease (HCC)    HX OF TREATMENT FOR VARICOSE VEINS RIGHT LEG   Thyroid disease    TOLD BLOOD LEVELS SLIGHTLY ABNORMAL - BUT NOT REQUIRED MEDICATION    Patient Active Problem List   Diagnosis Date Noted   Primary osteoarthritis of left knee 09/08/2022   Vitamin D deficiency 09/08/2022   Hypothyroidism 11/29/2021   Body mass index (BMI) of 35.0-35.9 in adult 06/04/2020   History of migraine headaches 12/06/2019   Dyslipidemia 06/30/2019   GERD with esophagitis 06/30/2019   Chronic vertigo  04/13/2018   Prediabetes 10/17/2015   Spinal stenosis, lumbar region, with neurogenic claudication 10/24/2014    Past Surgical History:  Procedure Laterality Date   BACK SURGERY     bladder lift     AT SAME TIME OF HYSTERECTOMY   BRAVO [place] STUDY N/A 07/26/2021   Procedure: BRAVO PH STUDY;  Surgeon: Jeani Hawking, MD;  Location: [facility];  Service: Endoscopy;  Laterality: N/A;   CHOLECYSTECTOMY     ESOPHAGOGASTRODUODENOSCOPY (EGD) WITH PROPOFOL N/A 07/26/2021   Procedure: ESOPHAGOGASTRODUODENOSCOPY (EGD) WITH PROPOFOL;  Surgeon: Jeani Hawking, MD;  Location: [facility];  Service: Endoscopy;  Laterality: N/A;   LUMBAR LAMINECTOMY/DECOMPRESSION MICRODISCECTOMY Left 10/24/2014   Procedure: COMPLETE LUMBAR DECOMPRESSION L4-L5 CENTRAL AND MICRODISCECTOMY L4-L5 LEFT;  Surgeon: Jacki Cones, MD;  Location: [facility];  Service: Orthopedics;  Laterality: Left;   POLYPECTOMY  07/26/2021   Procedure: POLYPECTOMY;  Surgeon: Jeani Hawking, MD;  Location: [facility];  Service: Endoscopy;;   VAGINAL HYSTERECTOMY      OB History     Gravida  5   Para      Term      Preterm      AB      Living  5      SAB      IAB  Ectopic      Multiple      Live Births  5            Home Medications    Prior to Admission medications   Medication Sig Start Date End Date Taking? Authorizing Provider  ALPRAZolam Prudy Feeler) 0.5 MG tablet Take 1 tablet (0.5 mg total) by mouth daily as needed for anxiety. 12/31/22   [name], MD  amoxicillin-clavulanate (AUGMENTIN) 875-125 MG tablet Take 1 tablet by mouth 2 (two) times daily. 02/04/23   [name], PA-C  atorvastatin (LIPITOR) 40 MG tablet TAKE 1 TABLET(40 MG) BY MOUTH DAILY 02/12/22   [name], MD  benzonatate (TESSALON) 200 MG capsule Take 1 capsule (200 mg total) by mouth 2 (two) times daily as needed for cough. 10/22/22   [name], [name], NP-C  cetirizine (ZYRTEC ALLERGY) 10 MG tablet Take 1 tablet (10 mg  total) by mouth daily. 02/04/23   [name], PA-C  famotidine (PEPCID) 20 MG tablet Take 1 tablet (20 mg total) by mouth 2 (two) times daily. 02/04/23   [name], PA-C  fluticasone (FLONASE) 50 MCG/ACT nasal spray Place 1 spray into both nostrils daily. 10/29/22   [name], [name], NP-C  levothyroxine (SYNTHROID) 50 MCG tablet TAKE 1 TABLET(50 MCG) BY MOUTH DAILY 03/24/22   [name], MD  Olopatadine HCl 0.2 % SOLN Apply 1 drop to eye daily as needed. 02/04/23   [name], PA-C  pantoprazole (PROTONIX) 40 MG tablet Take 40 mg by mouth daily before breakfast.    [provider]  pseudoephedrine (SUDAFED) 30 MG tablet Take 1 tablet (30 mg total) by mouth every 8 (eight) hours as needed for congestion. 02/04/23   [name], PA-C    Family History Family History  Problem Relation Age of Onset   Hypertension Mother    Arthritis Mother    Diabetes Mother    Hyperlipidemia Mother    Arthritis Sister    Rheum arthritis Brother    Autoimmune disease Brother    Lung disease Brother    Arthritis Sister    Gout Son    High Cholesterol Daughter    Thyroid disease Neg Hx     Social History Social History   Tobacco Use   Smoking status: Never   Smokeless tobacco: Never  Vaping Use   Vaping Use: Never used  Substance Use Topics   Alcohol use: No   Drug use: Never     Allergies   Patient has no known allergies.   Review of Systems Review of Systems  Genitourinary:  Positive for dysuria.  Musculoskeletal:  Positive for back pain.     Physical Exam Triage Vital Signs ED Triage Vitals  Enc Vitals Group     BP 03/02/23 1044 128/76     Pulse Rate 03/02/23 1044 67     Resp 03/02/23 1044 18     Temp 03/02/23 1044 98.1 F (36.7 C)     Temp Source 03/02/23 1044 Oral     SpO2 03/02/23 1044 96 %     Weight --      Height --      Head Circumference --      Peak Flow --      Pain Score 03/02/23 1047 7     Pain Loc --      Pain Edu? --      Excl. in GC? --     No data found.  Updated Vital Signs BP 128/76 (  BP Location: Right Arm)   Pulse 67   Temp 98.1 F (36.7 C) (Oral)   Resp 18   SpO2 96%   Visual Acuity Right Eye Distance:   Left Eye Distance:   Bilateral Distance:    Right Eye Near:   Left Eye Near:    Bilateral Near:     Physical Exam Vitals and nursing note reviewed.  Constitutional:      General: She is not in acute distress.    Appearance: Normal appearance. She is obese. She is not ill-appearing, toxic-appearing or diaphoretic.  HENT:     Head: Normocephalic and atraumatic.  Eyes:     Pupils: Pupils are equal, round, and reactive to light.  Cardiovascular:     Rate and Rhythm: Normal rate.  Pulmonary:     Effort: Pulmonary effort is normal.  Abdominal:     General: Bowel sounds are normal.     Palpations: Abdomen is soft. There is no hepatomegaly or splenomegaly.     Tenderness: There is abdominal tenderness in the right upper quadrant and right lower quadrant. There is no right CVA tenderness or left CVA tenderness. Negative signs include Rovsing's sign and McBurney's sign.  Musculoskeletal:     Cervical back: Normal.     Thoracic back: Normal.     Lumbar back: Tenderness present. No swelling, edema or bony tenderness. Normal range of motion.       Back:  Skin:    General: Skin is warm and dry.  Neurological:     General: No focal deficit present.     Mental Status: She is alert and oriented to person, place, and time.  Psychiatric:        Mood and Affect: Mood normal.        Behavior: Behavior normal.      UC Treatments / Results  Labs (all labs ordered are listed, but only abnormal results are displayed) Labs Reviewed  POCT URINALYSIS DIP (MANUAL ENTRY) - Abnormal; Notable for the following components:      Result Value   Color, UA colorless (*)    All other components within normal limits  URINE CULTURE   s: Final result     Visible to patient: Yes (seen)     Next appt: 03/09/2023 at  08:20 AM in Internal Medicine [facility] Victorino December, MD)     Dx: Dyslipidemia; Prediabetes   1 Result Note     1 Patient Communication          Component Ref Range & Units 5 mo ago (09/08/22) 12 mo ago (03/06/22) 2 yr ago (02/07/21) 2 yr ago (06/04/20) 3 yr ago (06/30/19) 4 yr ago (05/31/18) 4 yr ago (04/13/18)  Sodium 135 - 145 mEq/L 137 138 138 137 R 140 R 138 R 140 R  Potassium 3.5 - 5.1 mEq/L 4.0 4.0 3.9 3.9 R 4.0 R 4.1 R 4.4 R  Chloride 96 - 112 mEq/L 102 102 102 101 R 101 R 101 R 100 R  CO2 19 - 32 mEq/L 28 29 30 23  R 23 R 24 R 25 R  Glucose, Bld 70 - 99 mg/dL 161 High  096 High  99 97 R 111 High  R 126 High  R 105 High  R  BUN 6 - 23 mg/dL 22 18 18 20  R 15 R 18 R 19 R  Creatinine, Ser 0.40 - 1.20 mg/dL 0.45 4.09 8.11 9.14 Low  R 0.70 R 0.64 R 0.55 Low  R  Total  Bilirubin 0.2 - 1.2 mg/dL 0.6 0.6 0.6 0.4 R 0.5 R 0.3 R 0.4 R  Alkaline Phosphatase 39 - 117 U/L 89 86 96 134 High  R 116 R 132 High  R 113 R  AST 0 - 37 U/L 14 14 15 14  R 14 R 15 R 13 R  ALT 0 - 35 U/L 11 13 15 12  R 12 R 22 R 12 R  Total Protein 6.0 - 8.3 g/dL 7.7 7.6 6.9 7.3 R 7.1 R 7.5 R 7.4 R  Albumin 3.5 - 5.2 g/dL 4.4 4.4 4.1 4.3 R 4.5 R 4.3 R 4.4 R  GFR >60.00 mL/min 99.45 97.93 CM 100.96 CM      Comment: Calculated using the CKD-EPI Creatinine Equation (2021)  Calcium 8.4 - 10.5 mg/dL 9.6 9.5 9.5 9.6 R 9.3 R 9.4 R 9.6 R  Resulting Agency [place] [facility] [place] [facility] [place] [facility]         Specimen Collected: 09/08/22 08:46 Last Resulted: 09/08/22 13:46       EKG   Radiology No results found.  Procedures Procedures (including critical care time)  Medications Ordered in UC Medications - No data to display  Initial Impression / Assessment and Plan / UC Course  I have reviewed the triage vital signs and the nursing notes.  Pertinent labs & imaging results that were available during my care of the patient were reviewed by me and considered in my medical  decision making (see chart for details).    Reviewed labs and recent progress notes from PCP  Reviewed exam and symptoms with patient and daughter.  No red flags.  UA negative for UTI.  Will culture given symptoms Discussed likely cystitis as potential cause.  May continue OTC Azo.  Discussed low back pain likely MSK in nature.  Mild tenderness to the right upper and lower quadrant on exam.  related to chronic diarrhea? Do not feel ER eval is indicated at this time.  Advised to increase hydration  Advised bland diet and advance as tolerated Follow-up with PCP in 2 days for recheck ER precautions reviewed and pt verbalized understanding  Final Clinical Impressions(s) / UC Diagnoses   Final diagnoses:  Dysuria     Discharge Instructions      The clinic will contact you with results of the urine culture if it is positive. Continue hydration you may use over-the-counter Azo as needed Start over-the-counter Tylenol or ibuprofen as needed for your low back pain Please follow-up with your PCP if your symptoms do not improve Please go to the ER for any worsening symptoms    ED Prescriptions   None    PDMP not reviewed this encounter.   Radford Pax, NP 03/02/23 1109

## 2023-03-02 NOTE — ED Triage Notes (Addendum)
Pt reports right sided low back pain,right lower quadrant  pain and burning when urinating x 4 days. Cranberry juices and pills gives no relief.

## 2023-03-04 LAB — URINE CULTURE: Culture: NO GROWTH

## 2023-03-09 ENCOUNTER — Encounter: Payer: Self-pay | Admitting: Emergency Medicine

## 2023-03-09 ENCOUNTER — Ambulatory Visit (INDEPENDENT_AMBULATORY_CARE_PROVIDER_SITE_OTHER): Payer: Commercial Managed Care - HMO | Admitting: Emergency Medicine

## 2023-03-09 VITALS — BP 120/84 | HR 60 | Temp 97.9°F | Ht 61.5 in | Wt 195.4 lb

## 2023-03-09 DIAGNOSIS — Z1231 Encounter for screening mammogram for malignant neoplasm of breast: Secondary | ICD-10-CM

## 2023-03-09 DIAGNOSIS — E785 Hyperlipidemia, unspecified: Secondary | ICD-10-CM | POA: Diagnosis not present

## 2023-03-09 DIAGNOSIS — E039 Hypothyroidism, unspecified: Secondary | ICD-10-CM | POA: Diagnosis not present

## 2023-03-09 DIAGNOSIS — R42 Dizziness and giddiness: Secondary | ICD-10-CM

## 2023-03-09 DIAGNOSIS — M545 Low back pain, unspecified: Secondary | ICD-10-CM | POA: Diagnosis not present

## 2023-03-09 DIAGNOSIS — R300 Dysuria: Secondary | ICD-10-CM | POA: Diagnosis not present

## 2023-03-09 DIAGNOSIS — G8929 Other chronic pain: Secondary | ICD-10-CM

## 2023-03-09 DIAGNOSIS — R350 Frequency of micturition: Secondary | ICD-10-CM

## 2023-03-09 DIAGNOSIS — R102 Pelvic and perineal pain: Secondary | ICD-10-CM

## 2023-03-09 DIAGNOSIS — M48062 Spinal stenosis, lumbar region with neurogenic claudication: Secondary | ICD-10-CM

## 2023-03-09 LAB — COMPREHENSIVE METABOLIC PANEL
ALT: 11 U/L (ref 0–35)
AST: 14 U/L (ref 0–37)
Albumin: 4.1 g/dL (ref 3.5–5.2)
Alkaline Phosphatase: 91 U/L (ref 39–117)
BUN: 17 mg/dL (ref 6–23)
CO2: 27 mEq/L (ref 19–32)
Calcium: 9.3 mg/dL (ref 8.4–10.5)
Chloride: 103 mEq/L (ref 96–112)
Creatinine, Ser: 0.61 mg/dL (ref 0.40–1.20)
GFR: 99.1 mL/min (ref >60.00)
Glucose, Bld: 103 mg/dL — ABNORMAL HIGH (ref 70–99)
Potassium: 3.9 mEq/L (ref 3.5–5.1)
Sodium: 139 mEq/L (ref 135–145)
Total Bilirubin: 0.6 mg/dL (ref 0.2–1.2)
Total Protein: 7.3 g/dL (ref 6.0–8.3)

## 2023-03-09 LAB — POCT URINALYSIS DIPSTICK
Bilirubin, UA: NEGATIVE
Blood, UA: NEGATIVE
Glucose, UA: NEGATIVE
Ketones, UA: NEGATIVE
Leukocytes, UA: NEGATIVE
Nitrite, UA: NEGATIVE
Protein, UA: NEGATIVE
Spec Grav, UA: 1.01 (ref 1.010–1.025)
Urobilinogen, UA: 0.2 E.U./dL
pH, UA: 6 (ref 5.0–8.0)

## 2023-03-09 LAB — LIPID PANEL
Cholesterol: 143 mg/dL (ref 0–200)
HDL: 48.9 mg/dL (ref >39.00)
LDL Cholesterol: 59 mg/dL (ref 0–99)
NonHDL: 93.63
Total CHOL/HDL Ratio: 3
Triglycerides: 174 mg/dL — ABNORMAL HIGH (ref 0.0–149.0)
VLDL: 34.8 mg/dL (ref 0.0–40.0)

## 2023-03-09 LAB — CBC WITH DIFFERENTIAL/PLATELET
Basophils Absolute: 0 10*3/uL (ref 0.0–0.1)
Basophils Relative: 0.7 % (ref 0.0–3.0)
Eosinophils Absolute: 0 10*3/uL (ref 0.0–0.7)
Eosinophils Relative: 0.9 % (ref 0.0–5.0)
HCT: 37.6 % (ref 36.0–46.0)
Hemoglobin: 12.8 g/dL (ref 12.0–15.0)
Lymphocytes Relative: 34.8 % (ref 12.0–46.0)
Lymphs Abs: 1.8 10*3/uL (ref 0.7–4.0)
MCHC: 33.9 g/dL (ref 30.0–36.0)
MCV: 81.8 fl (ref 78.0–100.0)
Monocytes Absolute: 0.4 10*3/uL (ref 0.1–1.0)
Monocytes Relative: 7.5 % (ref 3.0–12.0)
Neutro Abs: 2.9 10*3/uL (ref 1.4–7.7)
Neutrophils Relative %: 56.1 % (ref 43.0–77.0)
Platelets: 274 10*3/uL (ref 150.0–400.0)
RBC: 4.6 Mil/uL (ref 3.87–5.11)
RDW: 12.6 % (ref 11.5–15.5)
WBC: 5.2 10*3/uL (ref 4.0–10.5)

## 2023-03-09 LAB — TSH: TSH: 1.18 u[IU]/mL (ref 0.35–5.50)

## 2023-03-09 MED ORDER — CEFUROXIME AXETIL 250 MG PO TABS
250.0000 mg | ORAL_TABLET | Freq: Two times a day (BID) | ORAL | 0 refills | Status: AC
Start: 2023-03-09 — End: 2023-03-16

## 2023-03-09 NOTE — Patient Instructions (Signed)

## 2023-03-09 NOTE — Assessment & Plan Note (Signed)
Chronic and intermittent. Not affecting quality of life or ADLs

## 2023-03-09 NOTE — Assessment & Plan Note (Signed)
Pain management discussed Needs GYN referral

## 2023-03-09 NOTE — Assessment & Plan Note (Signed)
Chronic stable condition Continue atorvastatin 40 mg daily Lipid profile done today The 10-year ASCVD risk score (Arnett DK, et al., 2019) is: 1.4%   Values used to calculate the score:     Age: 57 years     Sex: Female     Is Non-Hispanic African American: No     Diabetic: No     Tobacco smoker: No     Systolic Blood Pressure: 120 mmHg     Is BP treated: No     HDL Cholesterol: 55.4 mg/dL     Total Cholesterol: 133 mg/dL

## 2023-03-09 NOTE — Assessment & Plan Note (Signed)
Negative urinalysis but still symptomatic. Urine sent for culture Recommend to start antibiotics until culture is back Recommend Ceftin 250 mg twice a day for 7 days Advised to stay well-hydrated May use Azo and or cranberry juice for symptom management

## 2023-03-09 NOTE — Assessment & Plan Note (Signed)
Creating chronic lumbar pain Has already been evaluated by orthopedist MRI of lumbar spine was recommended.  Not done yet due to insurance issues. Chronic pain management discussed

## 2023-03-09 NOTE — Progress Notes (Signed)
Kendra Rodriguez 57 y.o.   Chief Complaint  Patient presents with   Medical Management of Chronic Issues    f/u appt, no concerns    Urinary Tract Infection    Burning during urination, frequent urination     HISTORY OF PRESENT ILLNESS: This is a 58 y.o. female complaining of burning on urination that started about 10 days ago.  Was seen at urgent care center 8 days ago.  Negative urinalysis.  Told she does not have an infection.  Continues to have burning on urination and frequency Has history of chronic pelvic pain.  Needs new GYN referral.  Status post hysterectomy about 12 years ago Also has history of chronic back pain.  Has been recently seen by sports medicine group.  Urinary Tract Infection  Associated symptoms include frequency. Pertinent negatives include no chills, flank pain, hematuria, nausea or vomiting.     Prior to Admission medications   Medication Sig Start Date End Date Taking? Authorizing Provider  atorvastatin (LIPITOR) 40 MG tablet TAKE 1 TABLET(40 MG) BY MOUTH DAILY 02/12/22  Yes [name], Eilleen Kempf, MD  cetirizine (ZYRTEC ALLERGY) 10 MG tablet Take 1 tablet (10 mg total) by mouth daily. 02/04/23  Yes [name], PA-C  famotidine (PEPCID) 20 MG tablet Take 1 tablet (20 mg total) by mouth 2 (two) times daily. 02/04/23  Yes [name], PA-C  fluticasone (FLONASE) 50 MCG/ACT nasal spray Place 1 spray into both nostrils daily. 10/29/22  Yes [name], [name], NP-C  levothyroxine (SYNTHROID) 50 MCG tablet TAKE 1 TABLET(50 MCG) BY MOUTH DAILY 03/24/22  Yes [name], MD  Olopatadine HCl 0.2 % SOLN Apply 1 drop to eye daily as needed. 02/04/23  Yes [name], PA-C  pantoprazole (PROTONIX) 40 MG tablet Take 40 mg by mouth daily before breakfast.   Yes [provider]  ALPRAZolam (XANAX) 0.5 MG tablet Take 1 tablet (0.5 mg total) by mouth daily as needed for anxiety. Patient not taking: Reported on 03/09/2023 12/31/22   [name], MD    No  Known Allergies  Patient Active Problem List   Diagnosis Date Noted   Primary osteoarthritis of left knee 09/08/2022   Vitamin D deficiency 09/08/2022   Hypothyroidism 11/29/2021   Body mass index (BMI) of 35.0-35.9 in adult 06/04/2020   History of migraine headaches 12/06/2019   Dyslipidemia 06/30/2019   GERD with esophagitis 06/30/2019   Chronic vertigo 04/13/2018   Prediabetes 10/17/2015   Spinal stenosis, lumbar region, with neurogenic claudication 10/24/2014    Past Medical History:  Diagnosis Date   Acute sinusitis    Allergy    Anxiety    Depression    NO MEDICATIONS - DOING OK NOW   Dizziness    Ear ache    RIGHT --HX OF MULTIPLE EAR INFECTIONS AS A CHILD    GERD (gastroesophageal reflux disease)    Headache(784.0)    MIGRAINES   Leg pain, left    with swelling   Lumbar stenosis    PAIN BACK AND LEFT LEG AND NUMBNESS LEG AND FOOT   Peripheral vascular disease (HCC)    HX OF TREATMENT FOR VARICOSE VEINS RIGHT LEG   Thyroid disease    TOLD BLOOD LEVELS SLIGHTLY ABNORMAL - BUT NOT REQUIRED MEDICATION    Past Surgical History:  Procedure Laterality Date   BACK SURGERY     bladder lift     AT SAME TIME OF HYSTERECTOMY   BRAVO PH STUDY N/A 07/26/2021   Procedure:  BRAVO PH STUDY;  Surgeon: Jeani Hawking, MD;  Location: [facility];  Service: Endoscopy;  Laterality: N/A;   CHOLECYSTECTOMY     ESOPHAGOGASTRODUODENOSCOPY (EGD) WITH PROPOFOL N/A 07/26/2021   Procedure: ESOPHAGOGASTRODUODENOSCOPY (EGD) WITH PROPOFOL;  Surgeon: Jeani Hawking, MD;  Location: [facility];  Service: Endoscopy;  Laterality: N/A;   LUMBAR LAMINECTOMY/DECOMPRESSION MICRODISCECTOMY Left 10/24/2014   Procedure: COMPLETE LUMBAR DECOMPRESSION L4-L5 CENTRAL AND MICRODISCECTOMY L4-L5 LEFT;  Surgeon: Jacki Cones, MD;  Location: [facility];  Service: Orthopedics;  Laterality: Left;   POLYPECTOMY  07/26/2021   Procedure: POLYPECTOMY;  Surgeon: Jeani Hawking, MD;  Location: [facility];  Service:  Endoscopy;;   VAGINAL HYSTERECTOMY      Social History   Socioeconomic History   Marital status: Married    Spouse name: Not on file   Number of children: Not on file   Years of education: Not on file   Highest education level: Not on file  Occupational History   Not on file  Tobacco Use   Smoking status: Never   Smokeless tobacco: Never  Vaping Use   Vaping Use: Never used  Substance and Sexual Activity   Alcohol use: No   Drug use: Never   Sexual activity: Yes  Other Topics Concern   Not on file  Social History Narrative   ** Merged History Encounter **       Social Determinants of Health   Financial Resource Strain: Not on file  Food Insecurity: Not on file  Transportation Needs: Not on file  Physical Activity: Not on file  Stress: Not on file  Social Connections: Not on file  Intimate Partner Violence: Not on file    Family History  Problem Relation Age of Onset   Hypertension Mother    Arthritis Mother    Diabetes Mother    Hyperlipidemia Mother    Arthritis Sister    Rheum arthritis Brother    Autoimmune disease Brother    Lung disease Brother    Arthritis Sister    Gout Son    High Cholesterol Daughter    Thyroid disease Neg Hx      Review of Systems  Constitutional: Negative.  Negative for chills and fever.  HENT: Negative.  Negative for congestion and sore throat.   Respiratory: Negative.  Negative for cough and shortness of breath.   Cardiovascular: Negative.  Negative for chest pain and palpitations.  Gastrointestinal:  Negative for abdominal pain, diarrhea, nausea and vomiting.  Genitourinary:  Positive for dysuria and frequency. Negative for flank pain and hematuria.  Musculoskeletal:  Positive for back pain and joint pain.  Skin: Negative.  Negative for rash.  Neurological: Negative.  Negative for dizziness and headaches.  All other systems reviewed and are negative.   Vitals:   03/09/23 0811  BP: 120/84  Pulse: 60  Temp: 97.9 F  (36.6 C)  SpO2: 98%    Physical Exam Vitals reviewed.  Constitutional:      Appearance: Normal appearance.  HENT:     Head: Normocephalic.     Mouth/Throat:     Mouth: Mucous membranes are moist.     Pharynx: Oropharynx is clear.  Eyes:     Extraocular Movements: Extraocular movements intact.     Conjunctiva/sclera: Conjunctivae normal.     Pupils: Pupils are equal, round, and reactive to light.  Cardiovascular:     Rate and Rhythm: Normal rate and regular rhythm.     Pulses: Normal pulses.     Heart sounds: Normal  heart sounds.  Pulmonary:     Effort: Pulmonary effort is normal.     Breath sounds: Normal breath sounds.  Abdominal:     Palpations: Abdomen is soft.     Tenderness: There is no abdominal tenderness. There is no right CVA tenderness or left CVA tenderness.  Musculoskeletal:     Cervical back: No tenderness.  Lymphadenopathy:     Cervical: No cervical adenopathy.  Skin:    General: Skin is warm and dry.     Capillary Refill: Capillary refill takes less than 2 seconds.  Neurological:     General: No focal deficit present.     Mental Status: She is alert and oriented to person, place, and time.  Psychiatric:        Mood and Affect: Mood normal.        Behavior: Behavior normal.    Results for orders placed or performed in visit on 03/09/23 (from the past 24 hour(s))  POCT Urinalysis Dipstick     Status: Normal   Collection Time: 03/09/23  8:26 AM  Result Value Ref Range   Color, UA yellow    Clarity, UA clear    Glucose, UA Negative Negative   Bilirubin, UA negative    Ketones, UA negative    Spec Grav, UA 1.010 1.010 - 1.025   Blood, UA negative    pH, UA 6.0 5.0 - 8.0   Protein, UA Negative Negative   Urobilinogen, UA 0.2 0.2 or 1.0 E.U./dL   Nitrite, UA negative    Leukocytes, UA Negative Negative   Appearance     Odor       ASSESSMENT & PLAN: A total of 45 minutes was spent with the patient and counseling/coordination of care regarding  preparing for this visit, review of most recent office visit notes, review of multiple chronic medical conditions under management, review of most recent blood work results, review of health maintenance items, education on nutrition, chronic pain management, differential diagnosis of dysuria, prognosis, documentation and need for follow-up.  Problem List Items Addressed This Visit       Endocrine   Hypothyroidism    Clinically euthyroid TSH done today Continues Synthroid 50 mcg daily      Relevant Orders   TSH     Other   Spinal stenosis, lumbar region, with neurogenic claudication    Creating chronic lumbar pain Has already been evaluated by orthopedist MRI of lumbar spine was recommended.  Not done yet due to insurance issues. Chronic pain management discussed       Chronic vertigo    Chronic and intermittent. Not affecting quality of life or ADLs      Dyslipidemia    Chronic stable condition Continue atorvastatin 40 mg daily Lipid profile done today The 10-year ASCVD risk score (Arnett DK, et al., 2019) is: 1.4%   Values used to calculate the score:     Age: 8 years     Sex: Female     Is Non-Hispanic African American: No     Diabetic: No     Tobacco smoker: No     Systolic Blood Pressure: 120 mmHg     Is BP treated: No     HDL Cholesterol: 55.4 mg/dL     Total Cholesterol: 133 mg/dL       Relevant Orders   Lipid panel   Dysuria - Primary    Negative urinalysis but still symptomatic. Urine sent for culture Recommend to start antibiotics until culture is back  Recommend Ceftin 250 mg twice a day for 7 days Advised to stay well-hydrated May use Azo and or cranberry juice for symptom management      Relevant Medications   cefUROXime (CEFTIN) 250 MG tablet   Other Relevant Orders   Urine Culture   Comprehensive metabolic panel   CBC with Differential/Platelet   Chronic pelvic pain in female    Pain management discussed Needs GYN referral       Relevant Orders   Ambulatory referral to Gynecology   Chronic bilateral low back pain without sciatica   Other Visit Diagnoses     Frequent urination       Relevant Orders   POCT Urinalysis Dipstick (Completed)   Burning with urination       Relevant Orders   POCT Urinalysis Dipstick (Completed)      Patient Instructions  Mantenimiento de la salud en las mujeres Health Maintenance, Female Adoptar un estilo de vida saludable y recibir atencin preventiva son importantes para promover la salud y Counsellor. Consulte al mdico sobre: El esquema adecuado para hacerse pruebas y exmenes peridicos. Cosas que puede hacer por su cuenta para prevenir enfermedades y [place] sano. Qu debo saber sobre la dieta, el peso y el ejercicio? Consuma una dieta saludable  Consuma una dieta que incluya muchas verduras, frutas, productos lcteos con bajo contenido de [place] y Associate Professor. No consuma muchos alimentos ricos en grasas slidas, azcares agregados o sodio. Mantenga un peso saludable El ndice de masa muscular [facility]) se [place] para identificar problemas de [place]. Proporciona una estimacin de la grasa corporal basndose en el peso y la altura. Su mdico puede ayudarle a Engineer, site IMC y a Personnel officer o Pharmacologist un peso saludable. Haga ejercicio con regularidad Haga ejercicio con regularidad. Esta es una de las prcticas ms importantes que puede hacer por su salud. La [employer] de los adultos deben seguir estas pautas: Education officer, environmental, al menos, 150 minutos de actividad fsica por semana. El ejercicio debe aumentar la frecuencia cardaca y Media planner transpirar (ejercicio de intensidad moderada). Hacer ejercicios de fortalecimiento por lo [employer] por semana. Agregue esto a su plan de ejercicio de intensidad moderada. Pase menos tiempo sentada. Incluso la actividad fsica ligera puede ser beneficiosa. Controle sus niveles de colesterol y lpidos en la sangre Comience a realizarse anlisis  de lpidos y Oncologist en la sangre a los 20 aos y luego reptalos cada 5 aos. Hgase controlar los niveles de colesterol con mayor frecuencia si: Sus niveles de lpidos y colesterol son altos. Es mayor de 40 aos. Presenta un alto riesgo de padecer enfermedades cardacas. Qu debo saber sobre las pruebas de deteccin del cncer? Segn su historia clnica y sus antecedentes familiares, es posible que deba realizarse pruebas de deteccin del cncer en diferentes edades. Esto puede incluir pruebas de deteccin de lo siguiente: Cncer de mama. Cncer de cuello uterino. Cncer colorrectal. Cncer de piel. Cncer de pulmn. Qu debo saber sobre la enfermedad cardaca, la diabetes y la hipertensin arterial? Presin arterial y enfermedad cardaca La hipertensin arterial causa enfermedades cardacas y [place] el riesgo de accidente cerebrovascular. Es ms probable que esto se manifieste en las personas que tienen lecturas de presin arterial alta o tienen sobrepeso. Hgase controlar la presin arterial: Cada 3 a 5 aos si tiene entre 18 y 2 aos. Todos los aos si es mayor de 40 aos. Diabetes Realcese exmenes de deteccin de la diabetes con regularidad. Este anlisis revisa el nivel de Banker en  ayunas. Hgase las pruebas de deteccin: Cada tres aos despus de los 40 aos de edad si tiene un peso normal y un bajo riesgo de padecer diabetes. Con ms frecuencia y a partir de [place] edad inferior si tiene sobrepeso o un alto riesgo de padecer diabetes. Qu debo saber sobre la prevencin de infecciones? Hepatitis B Si tiene un riesgo ms alto de contraer hepatitis B, debe someterse a un examen de deteccin de este virus. Hable con el mdico para averiguar si tiene riesgo de contraer la infeccin por hepatitis B. Hepatitis C Se recomienda el anlisis a: [employer] 1945 y 1965. Todas las personas que tengan un riesgo de haber contrado hepatitis C. Enfermedades  de transmisin sexual (ETS) Hgase las pruebas de Airline pilot de ITS, incluidas la gonorrea y la clamidia, si: Es sexualmente activa y es menor de [address]. Es mayor de [address], y Public affairs consultant informa que corre riesgo de tener este tipo de infecciones. La actividad sexual ha cambiado desde que le hicieron la ltima prueba de deteccin y tiene un riesgo mayor de Warehouse manager clamidia o Copy. Pregntele al mdico si usted tiene riesgo. Pregntele al mdico si usted tiene un alto riesgo de Primary school teacher VIH. El mdico tambin puede recomendarle un medicamento recetado para ayudar a evitar la infeccin por el VIH. Si elige tomar medicamentos para prevenir el VIH, primero debe [employer] de deteccin del VIH. Luego debe hacerse anlisis cada 3 meses mientras est tomando los medicamentos. Embarazo Si est por dejar de Armed forces training and education officer (fase premenopusica) y usted puede quedar [place], busque asesoramiento antes de [place]. [place] de 400 a 800 microgramos (mcg) de cido [employer] si [place]. Pida mtodos de control de la natalidad (anticonceptivos) si desea evitar un embarazo no deseado. Osteoporosis y [place] La osteoporosis es una enfermedad en la que los huesos pierden los minerales y la fuerza por el avance de la edad. El resultado pueden ser fracturas en los [place]. Si tiene 65 aos o ms, o si est en riesgo de sufrir osteoporosis y fracturas, pregunte a su mdico si debe: Hacerse pruebas de deteccin de prdida sea. Tomar un suplemento de calcio o de vitamina D para reducir el riesgo de fracturas. Recibir terapia de reemplazo hormonal (TRH) para tratar los sntomas de la menopausia. Siga estas indicaciones en su casa: Consumo de alcohol No beba alcohol si: Su mdico le indica no hacerlo. Est embarazada, puede estar embarazada o est tratando de [place]. Si bebe alcohol: Limite la cantidad que bebe a lo siguiente: De 0 a 1 bebida por da. Sepa cunta  cantidad de alcohol hay en las bebidas que toma. En los [address], una medida equivale a una botella de cerveza de 12 oz (355 ml), un vaso de vino de 5 oz (148 ml) o un vaso de una bebida alcohlica de alta graduacin de 1 oz (44 ml). Estilo de vida No consuma ningn producto que contenga nicotina o tabaco. Estos productos incluyen cigarrillos, tabaco para Theatre manager y aparatos de vapeo, como los Administrator, Civil Service. Si necesita ayuda para dejar de consumir estos productos, consulte al mdico. No consuma drogas. No comparta agujas. Solicite ayuda a su mdico si necesita apoyo o informacin para abandonar las drogas. Indicaciones generales Realcese los estudios de rutina de [address], dentales y de Wellsite geologist. Mantngase al da con las vacunas. Infrmele a su mdico si: Se siente deprimida con frecuencia. Alguna vez ha sido vctima de [place] o no se  siente seguro en su casa. Resumen Adoptar un estilo de vida saludable y recibir atencin preventiva son importantes para promover la salud y Counsellor. Siga las instrucciones del mdico acerca de una dieta saludable, el ejercicio y la realizacin de pruebas o exmenes para Hotel manager. Siga las instrucciones del mdico con respecto al control del colesterol y la presin arterial. Esta informacin no tiene Theme park manager el consejo del mdico. Asegrese de hacerle al mdico cualquier pregunta que tenga. Document Revised: 03/28/2021 Document Reviewed: 03/28/2021 Elsevier Patient Education  2023 Elsevier Inc.      Edwina Barth, MD [facility]

## 2023-03-09 NOTE — Assessment & Plan Note (Signed)
Clinically euthyroid TSH done today Continues Synthroid 50 mcg daily

## 2023-03-10 LAB — URINE CULTURE: Result:: NO GROWTH

## 2023-03-12 ENCOUNTER — Ambulatory Visit: Payer: Commercial Managed Care - HMO | Admitting: Dermatology

## 2023-03-12 ENCOUNTER — Encounter: Payer: Self-pay | Admitting: Dermatology

## 2023-03-12 ENCOUNTER — Other Ambulatory Visit: Payer: Self-pay | Admitting: Internal Medicine

## 2023-03-12 DIAGNOSIS — D1801 Hemangioma of skin and subcutaneous tissue: Secondary | ICD-10-CM

## 2023-03-12 DIAGNOSIS — D1720 Benign lipomatous neoplasm of skin and subcutaneous tissue of unspecified limb: Secondary | ICD-10-CM

## 2023-03-12 DIAGNOSIS — W908XXA Exposure to other nonionizing radiation, initial encounter: Secondary | ICD-10-CM

## 2023-03-12 DIAGNOSIS — L578 Other skin changes due to chronic exposure to nonionizing radiation: Secondary | ICD-10-CM

## 2023-03-12 DIAGNOSIS — D1723 Benign lipomatous neoplasm of skin and subcutaneous tissue of right leg: Secondary | ICD-10-CM | POA: Diagnosis not present

## 2023-03-12 DIAGNOSIS — D1724 Benign lipomatous neoplasm of skin and subcutaneous tissue of left leg: Secondary | ICD-10-CM | POA: Diagnosis not present

## 2023-03-12 DIAGNOSIS — L814 Other melanin hyperpigmentation: Secondary | ICD-10-CM

## 2023-03-12 DIAGNOSIS — B079 Viral wart, unspecified: Secondary | ICD-10-CM | POA: Diagnosis not present

## 2023-03-12 DIAGNOSIS — Z1283 Encounter for screening for malignant neoplasm of skin: Secondary | ICD-10-CM

## 2023-03-12 DIAGNOSIS — L821 Other seborrheic keratosis: Secondary | ICD-10-CM

## 2023-03-12 DIAGNOSIS — X32XXXA Exposure to sunlight, initial encounter: Secondary | ICD-10-CM

## 2023-03-12 NOTE — Progress Notes (Signed)
New Patient Visit   Subjective  Kendra Rodriguez is a 57 y.o. female who presents for the following: Skin Cancer Screening and Full Body Skin Exam  Patient is here for a FBSC. Spots on hips and thighs. Painful to the touch sometimes. Getting bigger. Has seen a dermatologist before for spots on the arms for white balls many years ago. Has not previously had moles removed before. No family hx of skin cancer.   The patient presents for Total-Body Skin Exam (TBSE) for skin cancer screening and mole check. The patient has spots, moles and lesions to be evaluated, some may be new or changing and the patient has concerns that these could be cancer.    The following portions of the chart were reviewed this encounter and updated as appropriate: medications, allergies, medical history  Review of Systems:  No other skin or systemic complaints except as noted in HPI or Assessment and Plan.  Objective  Well appearing patient in no apparent distress; mood and affect are within normal limits.  A full examination was performed including scalp, head, eyes, ears, nose, lips, neck, chest, axillae, abdomen, back, buttocks, bilateral upper extremities, bilateral lower extremities, hands, feet, fingers, toes, fingernails, and toenails. All findings within normal limits unless otherwise noted below.   Relevant physical exam findings are noted in the Assessment and Plan.  Left Upper Eyelid Verrucous papules     Assessment & Plan   LENTIGINES, SEBORRHEIC KERATOSES, HEMANGIOMAS - Benign normal skin lesions - Benign-appearing - Call for any changes  MELANOCYTIC NEVI - Tan-brown and/or pink-flesh-colored symmetric macules and papules - Benign appearing on exam today - Observation - Call clinic for new or changing moles - Recommend daily use of broad spectrum spf 30+ sunscreen to sun-exposed areas.   ACTINIC DAMAGE - Chronic condition, secondary to cumulative UV/sun exposure - diffuse scaly  erythematous macules with underlying dyspigmentation - Recommend daily broad spectrum sunscreen SPF 30+ to sun-exposed areas, reapply every 2 hours as needed.  - Staying in the shade or wearing long sleeves, sun glasses (UVA+UVB protection) and wide brim hats (4-inch brim around the entire circumference of the hat) are also recommended for sun protection.  - Call for new or changing lesions.  SKIN CANCER SCREENING PERFORMED TODAY.    Lipoma of lower extremity, unspecified laterality  Related Procedures Ambulatory referral to Plastic Surgery  Viral warts, unspecified type Left Upper Eyelid  Destruction of lesion - Left Upper Eyelid Complexity: simple   Destruction method: cryotherapy   Informed consent: discussed and consent obtained   Timeout:  patient name, date of birth, surgical site, and procedure verified Lesion destroyed using liquid nitrogen: Yes   Region frozen until ice ball extended beyond lesion: Yes   Outcome: patient tolerated procedure well with no complications   Post-procedure details: wound care instructions given    Lipoma  Exam: Subcutaneous rubbery nodule(s) Location: Legs Expresses pain  Benign-appearing. Exam most consistent with a lipoma. Discussed that a lipoma is a benign fatty growth that can grow over time and sometimes get irritated. Recommend observation if it is not bothersome or changing. Discussed option of ILK injections or surgical excision to remove it if it is growing, symptomatic, or other changes noted. Please call for new or changing lesions so they can be evaluated.   Treatment: Sending referral to Surgeon   Return in about 1 year (around 03/11/2024).   Documentation: I have reviewed the above documentation for accuracy and completeness, and I agree with the above.  Langston Reusing, MD  I, Germaine Pomfret, CMA, am acting as scribe for Langston Reusing, MD.

## 2023-03-12 NOTE — Patient Instructions (Addendum)
[facility] [address] [place], [place] [address] [phone] Cryotherapy Aftercare  Wash gently with soap and water everyday.   Apply Vaseline at night and in the morning  Due to recent changes in healthcare laws, you may see results of your pathology and/or laboratory studies on MyChart before the doctors have had a chance to review them. We understand that in some cases there may be results that are confusing or concerning to you. Please understand that not all results are received at the same time and often the doctors may need to interpret multiple results in order to provide you with the best plan of care or course of treatment. Therefore, we ask that you please give [place] 2 business days to thoroughly review all your results before contacting the office for clarification. Should we see a critical lab result, you will be contacted sooner.   If You Need Anything After Your Visit  If you have any questions or concerns for your doctor, please call our main line at [phone] If no one answers, please leave a voicemail as directed and we will return your call as soon as possible. Messages left after 4 pm will be answered the following business day.   You may also send [place] a message via MyChart. We typically respond to MyChart messages within 1-2 business days.  For prescription refills, please ask your pharmacy to contact our office. Our fax number is [phone].  If you have an urgent issue when the clinic is closed that cannot wait until the next business day, you can page your doctor at the number below.    Please note that while we do our best to be available for urgent issues outside of office hours, we are not available 24/7.   If you have an urgent issue and are unable to reach [place], you may choose to seek medical care at your doctor's office, retail clinic, urgent care center, or emergency room.  If you have a medical emergency, please  immediately call 911 or go to the emergency department. In the event of inclement weather, please call our main line at [phone] for an update on the status of any delays or closures.  Dermatology Medication Tips: Please keep the boxes that topical medications come in in order to help keep track of the instructions about where and how to use these. Pharmacies typically print the medication instructions only on the boxes and not directly on the medication tubes.   If your medication is too expensive, please contact our office at [phone] or send [place] a message through MyChart.   We are unable to tell what your co-pay for medications will be in advance as this is different depending on your insurance coverage. However, we may be able to find a substitute medication at lower cost or fill out paperwork to get insurance to cover a needed medication.   If a prior authorization is required to get your medication covered by your insurance company, please allow [place] 1-2 business days to complete this process.  Drug prices often vary depending on where the prescription is filled and some pharmacies may offer cheaper prices.  The website www.goodrx.com contains coupons for medications through different pharmacies. The prices here do not account for what the cost may be with help from insurance (it may be cheaper with your insurance), but the website can give you the price if you did not use any insurance.  - You can print the associated  coupon and take it with your prescription to the pharmacy.  - You may also stop by our office during regular business hours and pick up a GoodRx coupon card.  - If you need your prescription sent electronically to a different pharmacy, notify our office through [facility] or by phone at [phone]

## 2023-03-24 ENCOUNTER — Ambulatory Visit: Payer: Commercial Managed Care - HMO | Admitting: "Endocrinology

## 2023-03-27 ENCOUNTER — Ambulatory Visit: Payer: Commercial Managed Care - HMO

## 2023-04-09 ENCOUNTER — Ambulatory Visit: Payer: Self-pay | Admitting: Nurse Practitioner

## 2023-04-09 ENCOUNTER — Other Ambulatory Visit: Payer: Self-pay | Admitting: Family Medicine

## 2023-04-21 ENCOUNTER — Other Ambulatory Visit: Payer: Self-pay | Admitting: Emergency Medicine

## 2023-04-21 DIAGNOSIS — E785 Hyperlipidemia, unspecified: Secondary | ICD-10-CM

## 2023-04-22 ENCOUNTER — Ambulatory Visit: Payer: Commercial Managed Care - HMO | Admitting: "Endocrinology

## 2023-04-23 ENCOUNTER — Telehealth: Payer: Self-pay | Admitting: "Endocrinology

## 2023-04-23 ENCOUNTER — Telehealth: Payer: Self-pay

## 2023-04-23 MED ORDER — LEVOTHYROXINE SODIUM 50 MCG PO TABS: ORAL_TABLET | ORAL | 0 refills | Status: DC

## 2023-04-23 NOTE — Telephone Encounter (Signed)
Levothyroxine 50 mcg sent to [facility].

## 2023-04-23 NOTE — Telephone Encounter (Addendum)
Levothyroxine 50 mcg 1 daily sent to [facility]

## 2023-04-23 NOTE — Telephone Encounter (Signed)
Patient need medication refills until appointment date. Please advise. If any questions Please call Pam.

## 2023-04-23 NOTE — Telephone Encounter (Signed)
Kendra Rodriguez stated that  it was the way the phone note was placed to begin with that it did not go through you first as it should have

## 2023-04-23 NOTE — Telephone Encounter (Signed)
Routing to Dr Roosevelt Locks for Medication Refill , Dr Roosevelt Locks this must be the patient that Elita Quick is discussing

## 2023-04-23 NOTE — Telephone Encounter (Signed)
Daughter called to confirm receiving message about RX.  I confirmed Levothyroxine 50 mcg sent to [facility] on [address]

## 2023-05-05 ENCOUNTER — Encounter: Payer: Self-pay | Admitting: "Endocrinology

## 2023-05-18 ENCOUNTER — Emergency Department (HOSPITAL_COMMUNITY): Payer: Commercial Managed Care - HMO

## 2023-05-18 ENCOUNTER — Emergency Department (HOSPITAL_COMMUNITY)
Admission: EM | Admit: 2023-05-18 | Discharge: 2023-05-18 | Disposition: A | Discharge status: Home / Self Care | Payer: Commercial Managed Care - HMO | Attending: Emergency Medicine | Admitting: Emergency Medicine

## 2023-05-18 ENCOUNTER — Encounter (HOSPITAL_COMMUNITY): Payer: Self-pay

## 2023-05-18 ENCOUNTER — Other Ambulatory Visit: Payer: Self-pay

## 2023-05-18 DIAGNOSIS — M5441 Lumbago with sciatica, right side: Secondary | ICD-10-CM | POA: Diagnosis not present

## 2023-05-18 DIAGNOSIS — M5431 Sciatica, right side: Secondary | ICD-10-CM

## 2023-05-18 DIAGNOSIS — M545 Low back pain, unspecified: Secondary | ICD-10-CM | POA: Diagnosis present

## 2023-05-18 MED ORDER — CYCLOBENZAPRINE HCL 10 MG PO TABS
5.0000 mg | ORAL_TABLET | Freq: Once | ORAL | Status: AC
  Administered 2023-05-18: 5 mg via ORAL
  Filled 2023-05-18: qty 1

## 2023-05-18 MED ORDER — OXYCODONE-ACETAMINOPHEN 5-325 MG PO TABS
1.0000 | ORAL_TABLET | Freq: Once | ORAL | Status: AC
  Administered 2023-05-18: 1 via ORAL
  Filled 2023-05-18: qty 1

## 2023-05-18 MED ORDER — CYCLOBENZAPRINE HCL 10 MG PO TABS: 10.0000 mg | ORAL_TABLET | Freq: Two times a day (BID) | ORAL | 0 refills | Status: AC | PRN

## 2023-05-18 NOTE — Discharge Instructions (Signed)
You were seen in the ER for low back pain. Your physical exam and imaging are consistent with sciatica so I would advise taking a muscle relaxer to manage symptoms as well as Tylenol as tolerated. If you experience and groin numbness, loss of bowel or bladder incontinence, return to the ER for further evaluation.

## 2023-05-18 NOTE — ED Provider Notes (Signed)
EMERGENCY DEPARTMENT AT [facility] Provider Note   CSN: [id number] Arrival date & time: 05/18/23  1432     History Chief Complaint  Patient presents with   Back Pain    Kendra Rodriguez is a 57 y.o. female.  Patient with past history significant for spinal stenosis in the lumbar region presents the emergency department with concerns of low back pain.  Reports that she was bending over to pick something up earlier today when she felt that she had a sharp pain that radiated from her low back into the right leg.  Denies bowel or bladder incontinence, saddle paresthesia.   Back Pain      Home Medications Prior to Admission medications   Medication Sig Start Date End Date Taking? Authorizing Provider  cyclobenzaprine (FLEXERIL) 10 MG tablet Take 1 tablet (10 mg total) by mouth 2 (two) times daily as needed for muscle spasms. 05/18/23  Yes Smitty Knudsen, PA-C  ALPRAZolam (XANAX) 0.5 MG tablet Take 1 tablet (0.5 mg total) by mouth daily as needed for anxiety. Patient not taking: Reported on 03/09/2023 12/31/22   [name], MD  atorvastatin (LIPITOR) 40 MG tablet TAKE 1 TABLET(40 MG) BY MOUTH DAILY 04/21/23   [name], MD  cetirizine (ZYRTEC ALLERGY) 10 MG tablet Take 1 tablet (10 mg total) by mouth daily. 02/04/23   [name], PA-C  famotidine (PEPCID) 20 MG tablet Take 1 tablet (20 mg total) by mouth 2 (two) times daily. 02/04/23   [name], PA-C  fluticasone (FLONASE) 50 MCG/ACT nasal spray SHAKE LIQUID AND USE 1 SPRAY IN EACH NOSTRIL DAILY 04/09/23   [name], MD  levothyroxine (SYNTHROID) 50 MCG tablet TAKE 1 TABLET(50 MCG) BY MOUTH DAILY 04/23/23   Motwani, Carin Hock, MD  Olopatadine HCl 0.2 % SOLN Apply 1 drop to eye daily as needed. 02/04/23   [name], PA-C  pantoprazole (PROTONIX) 40 MG tablet Take 40 mg by mouth daily before breakfast.    [provider]      Allergies    Patient has no known allergies.    Review of  Systems   Review of Systems  Musculoskeletal:  Positive for back pain.  All other systems reviewed and are negative.   Physical Exam Updated Vital Signs BP 120/84 (BP Location: Left Arm)   Pulse 62   Temp 97.7 F (36.5 C) (Oral)   Resp 16   Ht 5' 1.5" (1.562 m)   Wt 89.4 kg   SpO2 100%   BMI 36.62 kg/m  Physical Exam Vitals and nursing note reviewed.  Constitutional:      General: She is not in acute distress.    Appearance: She is well-developed.  HENT:     Head: Normocephalic and atraumatic.  Eyes:     Conjunctiva/sclera: Conjunctivae normal.  Cardiovascular:     Rate and Rhythm: Normal rate and regular rhythm.     Heart sounds: No murmur heard. Pulmonary:     Effort: Pulmonary effort is normal. No respiratory distress.     Breath sounds: Normal breath sounds.  Abdominal:     Palpations: Abdomen is soft.     Tenderness: There is no abdominal tenderness.  Musculoskeletal:        General: Tenderness present. No swelling, deformity or signs of injury. Normal range of motion.       Arms:     Cervical back: Neck supple.     Comments: Tenderness palpation along the paraspinal muscles  on the right lumbar region.  Some spasming noted.  No obvious bony deformity and no midline tenderness.  Skin:    General: Skin is warm and dry.     Capillary Refill: Capillary refill takes less than 2 seconds.  Neurological:     Mental Status: She is alert.  Psychiatric:        Mood and Affect: Mood normal.     ED Results / Procedures / Treatments   Labs (all labs ordered are listed, but only abnormal results are displayed) Labs Reviewed - No data to display  EKG None  Radiology DG Lumbar Spine Complete  Result Date: 05/18/2023 CLINICAL DATA:  Low back pain. EXAM: LUMBAR SPINE - COMPLETE 4+ VIEW COMPARISON:  November 27, 2021 FINDINGS: There is no evidence of lumbar spine fracture. Alignment is normal. Possible congenital non fusion of the posterior process of L5 vertebral body.  Mild posterior facet arthropathy. IMPRESSION: 1. No acute fracture or dislocation identified about the lumbar spine. 2. Mild posterior facet arthropathy. Electronically Signed   By: Ted Mcalpine M.D.   On: 05/18/2023 16:40    Procedures Procedures   Medications Ordered in ED Medications  cyclobenzaprine (FLEXERIL) tablet 5 mg (5 mg Oral Given 05/18/23 1539)  oxyCODONE-acetaminophen (PERCOCET/ROXICET) 5-325 MG per tablet 1 tablet (1 tablet Oral Given 05/18/23 1539)    ED Course/ Medical Decision Making/ A&P                           Medical Decision Making Amount and/or Complexity of Data Reviewed Radiology: ordered.  Risk Prescription drug management.   This patient presents to the ED for concern of low back pain.  Differential diagnosis includes cauda equina syndrome, lumbar radiculopathy, spinal abscess, neuropathy   Imaging Studies ordered:  I ordered imaging studies including x-ray lumbar spine I independently visualized and interpreted imaging which showed no acute fracture or dislocation, facet arthropathy I agree with the radiologist interpretation   Medicines ordered and prescription drug management:  I ordered medication including Flexeril, Percocet for pain, spasm Reevaluation of the patient after these medicines showed that the patient improved I have reviewed the patients home medicines and have made adjustments as needed   Problem List / ED Course:  Patient presents to the emergency department complaints of low back pain.  History significant for spinal stenosis in the lumbar region.  That she was from picking up off the ground earlier today when she felt a sharp pain and spasming in her right low back with radiation to the right lower leg.  No concerning signs for cauda equina syndrome such as saddle paresthesia, bowel or bladder incontinence. Given prior history of back surgery, will evaluate with xray imaging. Xray negative for any acute findings  although there is some facet arthropathy. Suspect likely sciatica given radiating pain down nerve distribution in the right leg. Will send prescription for Flexeril to patient's pharmacy for management of symptoms. Otherwise advised managing pain with Tylenol as tolerated. Patient is agreeable with treatment plan and verbalized understanding all return precautions.  Final Clinical Impression(s) / ED Diagnoses Final diagnoses:  Sciatica of right side    Rx / DC Orders ED Discharge Orders          Ordered    cyclobenzaprine (FLEXERIL) 10 MG tablet  2 times daily PRN        05/18/23 1836              Maryanna Shape  A, PA-C 05/18/23 1840    Wynetta Fines, MD 05/18/23 2251

## 2023-05-18 NOTE — ED Triage Notes (Signed)
Patient bent over to pick something up and tweaked her lower back. Pain radiates down her right leg. History of back surgery. Denies incontinence.

## 2023-05-19 ENCOUNTER — Telehealth: Payer: Self-pay

## 2023-05-19 NOTE — Transitions of Care (Post Inpatient/ED Visit) (Signed)
   05/19/2023  Name: Kendra Rodriguez MRN: [id number] DOB: December 22, 1965  Today's TOC FU Call Status: Today's TOC FU Call Status:: Unsuccessul Call (1st Attempt) Unsuccessful Call (1st Attempt) Date: 05/19/23  Attempted to reach the patient regarding the most recent Inpatient/ED visit.  Follow Up Plan: Additional outreach attempts will be made to reach the patient to complete the Transitions of Care (Post Inpatient/ED visit) call.   Signature Karena Addison, LPN [facility] Nurse Health Advisor Direct Dial [phone]

## 2023-05-21 ENCOUNTER — Ambulatory Visit: Payer: Commercial Managed Care - HMO | Admitting: "Endocrinology

## 2023-05-21 ENCOUNTER — Encounter: Payer: Self-pay | Admitting: "Endocrinology

## 2023-05-21 VITALS — BP 105/70 | HR 68 | Ht 61.0 in | Wt 192.4 lb

## 2023-05-21 DIAGNOSIS — R7303 Prediabetes: Secondary | ICD-10-CM

## 2023-05-21 DIAGNOSIS — E039 Hypothyroidism, unspecified: Secondary | ICD-10-CM | POA: Diagnosis not present

## 2023-05-21 MED ORDER — METFORMIN HCL ER 500 MG PO TB24: 500.0000 mg | ORAL_TABLET | Freq: Two times a day (BID) | ORAL | 3 refills | Status: AC

## 2023-05-21 MED ORDER — LEVOTHYROXINE SODIUM 50 MCG PO TABS
ORAL_TABLET | ORAL | 1 refills | Status: DC
Start: 2023-05-21 — End: 2023-09-20

## 2023-05-21 NOTE — Patient Instructions (Signed)
We will start you on an extended release version of metformin, which should help with the upset stomach. Start with one 500 mg capsule taken every evening with dinner. Stay on this for 3-5 days, then increase to 1 tablet with breakfast and one with dinner. If you do not have any upset stomach, gradually increase to the goal dose of 2 capsules with breakfast and 2 capsules with dinner. If you do have upset stomach, decrease it back to the previous dose that you tolerated.  Week 1: one tablet after dinner Week 2: one tablet after breakfast and one after dinner  Week 3: one tablet after breakfast and two after dinner  Week 4 and onwards: two tablets after breakfast and two after dinner

## 2023-05-21 NOTE — Progress Notes (Signed)
Outpatient Endocrinology Note Kendra Hedley, MD    Kendra Rodriguez Jul 24, 1966 [id number]  Referring Provider: Georgina Rodriguez, * Primary Care Provider: [name], MD Reason for consultation: Subjective   Assessment & Plan  Kendra Rodriguez was seen today for hypothyroidism.  Diagnoses and Rodriguez orders for this visit:  Acquired hypothyroidism -     levothyroxine (SYNTHROID) 50 MCG tablet; TAKE 1 TABLET(50 MCG) BY MOUTH DAILY -     TSH Rfx on Abnormal to Free T4; Future  Prediabetes -     Hemoglobin A1c; Future  Other orders -     metFORMIN (GLUCOPHAGE-XR) 500 MG 24 hr tablet; Take 1 tablet (500 mg total) by mouth 2 (two) times daily with a meal.   Continue levothyroxine 50 mcg every day Last TSH WNL Recommend to take levothyroxine first thing in the morning on empty stomach and wait at least 30 minutes to 1 hour before eating or drinking anything or taking any other medications. Space out levothyroxine by 4 hours from any acid reflux medication, fibrate, iron, calcium, multivitamin and nutritional supplements.   Repeat labs before next visit  Pre-diabetes with A1C 6.2% on 05/08/2023 Had diarrhea with metformin Recommend extended release version of metformin. Start with one 500 mg capsule taken every evening with dinner. Stay on this for 3-5 days, then increase to 1 tablet with breakfast and one with dinner. If you do not have any upset stomach, gradually increase to the goal dose of 2 capsules with breakfast and 2 capsules with dinner. If you do have upset stomach, decrease it back to the previous dose that you tolerated.  Week 1: one tablet after dinner Week 2: one tablet after breakfast and one after dinner  Week 3: one tablet after breakfast and two after dinner  Week 4 and onwards: two tablets after breakfast and two after dinner   Labs before next visit   Return in about 4 months (around 09/21/2023) for visit + labs before next visit.   I have reviewed current  medications, nurse's notes, allergies, vital signs, past medical and surgical history, family medical history, and social history for this encounter. Counseled patient on symptoms, examination findings, lab findings, imaging results, treatment decisions and monitoring and prognosis. The patient understood the recommendations and agrees with the treatment plan. Rodriguez questions regarding treatment plan were fully answered.  Kendra [place], MD  05/21/23   History of Present Illness HPI  Kendra Rodriguez is a pleasant 57 y.o. year old female who presents for evaluation of hypothyroidism.  Seen by Kendra Rodriguez in past. Per records she was diagnosed of subacute thyroiditis in 2022, when she presented with hyperthyroidism; she spontaneously developed hypothyroidism, and was treated with thyroid medication.  She has been out of levothyroxine since 5 days. Was on 50 mcg every day. Doesn't feel any different off or on medication. Took it appropriately before except took acid reflux medication 30 min after.  She feels tired, reports some weight gain, reports hot flashes followed by feeling cold Denies dysphagia/dysphonia/dyspnea Sometimes feels throat pain that goes away  She was on metformin for hyperglycemia since 2019, but per daughter was taken off of it by PCP who said she did not need it.   05/18/2023 TSH 1.05  A1C 6.2    Physical Exam  BP 105/70   Pulse 68   Ht 5\' 1"  (1.549 m)   Wt 192 lb 6.4 oz (87.3 kg)   SpO2 97%   BMI 36.35 kg/m  Constitutional: well developed, well nourished Head: normocephalic, atraumatic Eyes: sclera anicteric, no redness Neck: supple Lungs: normal respiratory effort Neurology: alert and oriented Skin: dry, no appreciable rashes Musculoskeletal: no appreciable defects Psychiatric: normal mood and affect   Current Medications Patient's Medications  New Prescriptions   METFORMIN (GLUCOPHAGE-XR) 500 MG 24 HR TABLET    Take 1 tablet (500 mg total) by mouth 2  (two) times daily with a meal.  Previous Medications   ALPRAZOLAM (XANAX) 0.5 MG TABLET    Take 1 tablet (0.5 mg total) by mouth daily as needed for anxiety.   ATORVASTATIN (LIPITOR) 40 MG TABLET    TAKE 1 TABLET(40 MG) BY MOUTH DAILY   CETIRIZINE (ZYRTEC ALLERGY) 10 MG TABLET    Take 1 tablet (10 mg total) by mouth daily.   CYCLOBENZAPRINE (FLEXERIL) 10 MG TABLET    Take 1 tablet (10 mg total) by mouth 2 (two) times daily as needed for muscle spasms.   FAMOTIDINE (PEPCID) 20 MG TABLET    Take 1 tablet (20 mg total) by mouth 2 (two) times daily.   FLUTICASONE (FLONASE) 50 MCG/ACT NASAL SPRAY    SHAKE LIQUID AND USE 1 SPRAY IN EACH NOSTRIL DAILY   OLOPATADINE HCL 0.2 % SOLN    Apply 1 drop to eye daily as needed.   PANTOPRAZOLE (PROTONIX) 40 MG TABLET    Take 40 mg by mouth daily before breakfast.  Modified Medications   Modified Medication Previous Medication   LEVOTHYROXINE (SYNTHROID) 50 MCG TABLET levothyroxine (SYNTHROID) 50 MCG tablet      TAKE 1 TABLET(50 MCG) BY MOUTH DAILY    TAKE 1 TABLET(50 MCG) BY MOUTH DAILY  Discontinued Medications   No medications on file    Allergies No Known Allergies  Past Medical History Past Medical History:  Diagnosis Date   Acute sinusitis    Allergy    Anxiety    Depression    NO MEDICATIONS - DOING OK NOW   Dizziness    Ear ache    RIGHT --HX OF MULTIPLE EAR INFECTIONS AS A CHILD    GERD (gastroesophageal reflux disease)    Headache(784.0)    MIGRAINES   Leg pain, left    with swelling   Lumbar stenosis    PAIN BACK AND LEFT LEG AND NUMBNESS LEG AND FOOT   Peripheral vascular disease (HCC)    HX OF TREATMENT FOR VARICOSE VEINS RIGHT LEG   Thyroid disease    TOLD BLOOD LEVELS SLIGHTLY ABNORMAL - BUT NOT REQUIRED MEDICATION    Past Surgical History Past Surgical History:  Procedure Laterality Date   BACK SURGERY     bladder lift     AT SAME TIME OF HYSTERECTOMY   BRAVO PH STUDY N/A 07/26/2021   Procedure: BRAVO PH STUDY;   Surgeon: Kendra Hawking, MD;  Location: [facility];  Service: Endoscopy;  Laterality: N/A;   CHOLECYSTECTOMY     ESOPHAGOGASTRODUODENOSCOPY (EGD) WITH PROPOFOL N/A 07/26/2021   Procedure: ESOPHAGOGASTRODUODENOSCOPY (EGD) WITH PROPOFOL;  Surgeon: Kendra Hawking, MD;  Location: [facility];  Service: Endoscopy;  Laterality: N/A;   LUMBAR LAMINECTOMY/DECOMPRESSION MICRODISCECTOMY Left 10/24/2014   Procedure: COMPLETE LUMBAR DECOMPRESSION L4-L5 CENTRAL AND MICRODISCECTOMY L4-L5 LEFT;  Surgeon: Jacki Cones, MD;  Location: [facility];  Service: Orthopedics;  Laterality: Left;   POLYPECTOMY  07/26/2021   Procedure: POLYPECTOMY;  Surgeon: Kendra Hawking, MD;  Location: [facility];  Service: Endoscopy;;   VAGINAL HYSTERECTOMY      Family History family history includes Arthritis  in her mother, sister, and sister; Autoimmune disease in her brother; Diabetes in her mother; Gout in her son; High Cholesterol in her daughter; Hyperlipidemia in her mother; Hypertension in her mother; Lung disease in her brother; Rheum arthritis in her brother.  Social History Social History   Socioeconomic History   Marital status: Married    Spouse name: Not on file   Number of children: Not on file   Years of education: Not on file   Highest education level: Not on file  Occupational History   Not on file  Tobacco Use   Smoking status: Never   Smokeless tobacco: Never  Vaping Use   Vaping status: Never Used  Substance and Sexual Activity   Alcohol use: No   Drug use: Never   Sexual activity: Yes  Other Topics Concern   Not on file  Social History Narrative   ** Merged History Encounter **       Social Determinants of Health   Financial Resource Strain: Not on file  Food Insecurity: Not on file  Transportation Needs: Not on file  Physical Activity: Not on file  Stress: Not on file  Social Connections: Not on file  Intimate Partner Violence: Not on file    Lab Results  Component Value Date    CHOL 143 03/09/2023   Lab Results  Component Value Date   HDL 48.90 03/09/2023   Lab Results  Component Value Date   LDLCALC 59 03/09/2023   Lab Results  Component Value Date   TRIG 174.0 (H) 03/09/2023   Lab Results  Component Value Date   CHOLHDL 3 03/09/2023   Lab Results  Component Value Date   CREATININE 0.61 03/09/2023   Lab Results  Component Value Date   GFR 99.10 03/09/2023      Component Value Date/Time   NA 139 03/09/2023 0854   NA 137 06/04/2020 1527   K 3.9 03/09/2023 0854   CL 103 03/09/2023 0854   CO2 27 03/09/2023 0854   GLUCOSE 103 (H) 03/09/2023 0854   BUN 17 03/09/2023 0854   BUN 20 06/04/2020 1527   CREATININE 0.61 03/09/2023 0854   CREATININE 0.58 10/17/2015 1026   CALCIUM 9.3 03/09/2023 0854   PROT 7.3 03/09/2023 0854   PROT 7.3 06/04/2020 1527   ALBUMIN 4.1 03/09/2023 0854   ALBUMIN 4.3 06/04/2020 1527   AST 14 03/09/2023 0854   ALT 11 03/09/2023 0854   ALKPHOS 91 03/09/2023 0854   BILITOT 0.6 03/09/2023 0854   BILITOT 0.4 06/04/2020 1527   GFRNONAA 107 06/04/2020 1527   GFRNONAA >89 10/17/2015 1026   GFRAA 124 06/04/2020 1527   GFRAA >89 10/17/2015 1026      Latest Ref Rng & Units 03/09/2023    8:54 AM 09/08/2022    8:46 AM 03/06/2022    8:42 AM  BMP  Glucose 70 - 99 mg/dL 401  027  253   BUN 6 - 23 mg/dL 17  22  18    Creatinine 0.40 - 1.20 mg/dL 6.64  4.03  4.74   Sodium 135 - 145 mEq/L 139  137  138   Potassium 3.5 - 5.1 mEq/L 3.9  4.0  4.0   Chloride 96 - 112 mEq/L 103  102  102   CO2 19 - 32 mEq/L 27  28  29    Calcium 8.4 - 10.5 mg/dL 9.3  9.6  9.5        Component Value Date/Time   WBC 5.2 03/09/2023 0854  RBC 4.60 03/09/2023 0854   HGB 12.8 03/09/2023 0854   HGB 12.6 06/04/2020 1527   HCT 37.6 03/09/2023 0854   HCT 37.7 06/04/2020 1527   PLT 274.0 03/09/2023 0854   PLT 315 06/04/2020 1527   MCV 81.8 03/09/2023 0854   MCV 82 06/04/2020 1527   MCH 27.5 06/04/2020 1527   MCH 27.4 06/30/2017 1038   MCHC 33.9  03/09/2023 0854   RDW 12.6 03/09/2023 0854   RDW 12.5 06/04/2020 1527   LYMPHSABS 1.8 03/09/2023 0854   LYMPHSABS 2.2 06/04/2020 1527   MONOABS 0.4 03/09/2023 0854   EOSABS 0.0 03/09/2023 0854   EOSABS 0.0 06/04/2020 1527   BASOSABS 0.0 03/09/2023 0854   BASOSABS 0.0 06/04/2020 1527   Lab Results  Component Value Date   TSH 1.18 03/09/2023   TSH 1.35 09/08/2022   TSH 0.33 (L) 03/06/2022   FREET4 1.61 11/29/2021   FREET4 0.99 08/28/2021   FREET4 1.06 05/28/2021         Parts of this note may have been dictated using voice recognition software. There may be variances in spelling and vocabulary which are unintentional. Not Rodriguez errors are proofread. Please notify the Thereasa Parkin if any discrepancies are noted or if the meaning of any statement is not clear.

## 2023-05-21 NOTE — Transitions of Care (Post Inpatient/ED Visit) (Unsigned)
   05/21/2023  Name: Kendra Rodriguez MRN: [id number] DOB: [birth year]  Today's TOC FU Call Status: Today's TOC FU Call Status:: Unsuccessful Call (2nd Attempt) Unsuccessful Call (1st Attempt) Date: 05/19/23 Unsuccessful Call (2nd Attempt) Date: 05/21/23  Attempted to reach the patient regarding the most recent Inpatient/ED visit.  Follow Up Plan: Additional outreach attempts will be made to reach the patient to complete the Transitions of Care (Post Inpatient/ED visit) call.   Signature Karena Addison, LPN [facility] Nurse Health Advisor Direct Dial [phone]

## 2023-05-25 NOTE — Transitions of Care (Post Inpatient/ED Visit) (Signed)
   05/25/2023  Name: Kendra Rodriguez MRN: [id number] DOB: [birth year]  Today's TOC FU Call Status: Today's TOC FU Call Status:: Successful TOC FU Call Competed Unsuccessful Call (1st Attempt) Date: 05/19/23 Unsuccessful Call (2nd Attempt) Date: 05/21/23 [facility] FU Call Complete Date: 05/25/23  Transition Care Management Follow-up Telephone Call Date of Discharge: 05/18/23 Discharge Facility: [facility]) Type of Discharge: Emergency Department Reason for ED Visit: Other: (sciaticia) How have you been since you were released from the hospital?: Same Any questions or concerns?: No  Items Reviewed: Did you receive and understand the discharge instructions provided?: Yes Medications obtained,verified, and reconciled?: Yes (Medications Reviewed) Any new allergies since your discharge?: No Dietary orders reviewed?: Yes Do you have support at home?: Yes People in Home: spouse  Medications Reviewed Today: Medications Reviewed Today     Reviewed by Karena Addison, LPN (Licensed Practical Nurse) on 05/25/23 at 1609  Med List Status: <None>   Medication Order Taking? Sig Documenting Provider Last Dose Status Informant  ALPRAZolam (XANAX) 0.5 MG tablet [id number] No Take 1 tablet (0.5 mg total) by mouth daily as needed for anxiety. [name], MD Taking Active   atorvastatin (LIPITOR) 40 MG tablet [id number] No TAKE 1 TABLET(40 MG) BY MOUTH DAILY Sagardia, Eilleen Kempf, MD Taking Active   cetirizine (ZYRTEC ALLERGY) 10 MG tablet [id number] No Take 1 tablet (10 mg total) by mouth daily. [name], PA-C Taking Active   cyclobenzaprine (FLEXERIL) 10 MG tablet [id number] No Take 1 tablet (10 mg total) by mouth 2 (two) times daily as needed for muscle spasms. Smitty Knudsen, PA-C Taking Active   famotidine (PEPCID) 20 MG tablet [id number] No Take 1 tablet (20 mg total) by mouth 2 (two) times daily. [name], PA-C Taking Active   fluticasone [facility]) 50 MCG/ACT nasal spray [id number] No  SHAKE LIQUID AND USE 1 SPRAY IN EACH NOSTRIL DAILY Sagardia, Eilleen Kempf, MD Taking Active   levothyroxine (SYNTHROID) 50 MCG tablet [id number]  TAKE 1 TABLET(50 MCG) BY MOUTH DAILY Motwani, Komal, MD  Active   metFORMIN (GLUCOPHAGE-XR) 500 MG 24 hr tablet [id number]  Take 1 tablet (500 mg total) by mouth 2 (two) times daily with a meal. Motwani, Komal, MD  Active   Olopatadine HCl 0.2 % SOLN [id number] No Apply 1 drop to eye daily as needed. [name], PA-C Taking Active   pantoprazole (PROTONIX) 40 MG tablet [id number] No Take 40 mg by mouth daily before breakfast. [provider] Taking Active Family Member            Home Care and Equipment/Supplies: Were Home Health Services Ordered?: NA Any new equipment or medical supplies ordered?: NA  Functional Questionnaire: Do you need assistance with bathing/showering or dressing?: No Do you need assistance with meal preparation?: No Do you need assistance with eating?: No Do you have difficulty maintaining continence: No Do you need assistance with getting out of bed/getting out of a chair/moving?: No Do you have difficulty managing or taking your medications?: No  Follow up appointments reviewed: PCP Follow-up appointment confirmed?: NA Specialist Hospital Follow-up appointment confirmed?: NA Do you need transportation to your follow-up appointment?: No Do you understand care options if your condition(s) worsen?: Yes-patient verbalized understanding Declined appt   SIGNATURE Karena Addison, LPN [facility] Nurse Health Advisor Direct Dial [phone]

## 2023-06-05 ENCOUNTER — Ambulatory Visit (INDEPENDENT_AMBULATORY_CARE_PROVIDER_SITE_OTHER): Payer: Commercial Managed Care - HMO | Admitting: Orthopaedic Surgery

## 2023-06-05 ENCOUNTER — Telehealth: Payer: Self-pay | Admitting: Endocrinology

## 2023-06-05 ENCOUNTER — Ambulatory Visit (HOSPITAL_BASED_OUTPATIENT_CLINIC_OR_DEPARTMENT_OTHER): Payer: Commercial Managed Care - HMO

## 2023-06-05 DIAGNOSIS — M4316 Spondylolisthesis, lumbar region: Secondary | ICD-10-CM | POA: Diagnosis not present

## 2023-06-05 NOTE — Progress Notes (Signed)
Chief Complaint: Lower back pain     History of Present Illness:    Kendra Rodriguez is a 57 y.o. female presents with ongoing right lower back pain with radiation into the right leg.  She does have a history of spinal decompression at L4-5 by Dr. [name].  Since this time she has had progressive pain in that does radiate into the right lower leg.  She has previously done aquatic therapy which did help somewhat.  She has previously had an injection which gave her temporary relief.  She is taking Tylenol as well.  She is here today for further discussion    Surgical History:   As above  PMH/PSH/Family History/Social History/Meds/Allergies:    Past Medical History:  Diagnosis Date   Acute sinusitis    Allergy    Anxiety    Depression    NO MEDICATIONS - DOING OK NOW   Dizziness    Ear ache    RIGHT --HX OF MULTIPLE EAR INFECTIONS AS A CHILD    GERD (gastroesophageal reflux disease)    Headache(784.0)    MIGRAINES   Leg pain, left    with swelling   Lumbar stenosis    PAIN BACK AND LEFT LEG AND NUMBNESS LEG AND FOOT   Peripheral vascular disease (HCC)    HX OF TREATMENT FOR VARICOSE VEINS RIGHT LEG   Thyroid disease    TOLD BLOOD LEVELS SLIGHTLY ABNORMAL - BUT NOT REQUIRED MEDICATION   Past Surgical History:  Procedure Laterality Date   BACK SURGERY     bladder lift     AT SAME TIME OF HYSTERECTOMY   BRAVO PH STUDY N/A 07/26/2021   Procedure: BRAVO PH STUDY;  Surgeon: Jeani Hawking, MD;  Location: [facility];  Service: Endoscopy;  Laterality: N/A;   CHOLECYSTECTOMY     ESOPHAGOGASTRODUODENOSCOPY (EGD) WITH PROPOFOL N/A 07/26/2021   Procedure: ESOPHAGOGASTRODUODENOSCOPY (EGD) WITH PROPOFOL;  Surgeon: Jeani Hawking, MD;  Location: [facility];  Service: Endoscopy;  Laterality: N/A;   LUMBAR LAMINECTOMY/DECOMPRESSION MICRODISCECTOMY Left 10/24/2014   Procedure: COMPLETE LUMBAR DECOMPRESSION L4-L5 CENTRAL AND MICRODISCECTOMY L4-L5 LEFT;   Surgeon: Jacki Cones, MD;  Location: [facility];  Service: Orthopedics;  Laterality: Left;   POLYPECTOMY  07/26/2021   Procedure: POLYPECTOMY;  Surgeon: Jeani Hawking, MD;  Location: [facility];  Service: Endoscopy;;   VAGINAL HYSTERECTOMY     Social History   Socioeconomic History   Marital status: Married    Spouse name: Not on file   Number of children: Not on file   Years of education: Not on file   Highest education level: Not on file  Occupational History   Not on file  Tobacco Use   Smoking status: Never   Smokeless tobacco: Never  Vaping Use   Vaping status: Never Used  Substance and Sexual Activity   Alcohol use: No   Drug use: Never   Sexual activity: Yes  Other Topics Concern   Not on file  Social History Narrative   ** Merged History Encounter **       Social Determinants of Health   Financial Resource Strain: Not on file  Food Insecurity: Not on file  Transportation Needs: Not on file  Physical Activity: Not on file  Stress: Not on file  Social Connections: Not on file   Family  History  Problem Relation Age of Onset   Hypertension Mother    Arthritis Mother    Diabetes Mother    Hyperlipidemia Mother    Arthritis Sister    Rheum arthritis Brother    Autoimmune disease Brother    Lung disease Brother    Arthritis Sister    Gout Son    High Cholesterol Daughter    Thyroid disease Neg Hx    No Known Allergies Current Outpatient Medications  Medication Sig Dispense Refill   ALPRAZolam (XANAX) 0.5 MG tablet Take 1 tablet (0.5 mg total) by mouth daily as needed for anxiety. 20 tablet 1   atorvastatin (LIPITOR) 40 MG tablet TAKE 1 TABLET(40 MG) BY MOUTH DAILY 90 tablet 3   cetirizine (ZYRTEC ALLERGY) 10 MG tablet Take 1 tablet (10 mg total) by mouth daily. 30 tablet 0   cyclobenzaprine (FLEXERIL) 10 MG tablet Take 1 tablet (10 mg total) by mouth 2 (two) times daily as needed for muscle spasms. 20 tablet 0   famotidine (PEPCID) 20 MG tablet Take 1  tablet (20 mg total) by mouth 2 (two) times daily. 30 tablet 0   fluticasone (FLONASE) 50 MCG/ACT nasal spray SHAKE LIQUID AND USE 1 SPRAY IN EACH NOSTRIL DAILY 16 g 2   levothyroxine (SYNTHROID) 50 MCG tablet TAKE 1 TABLET(50 MCG) BY MOUTH DAILY 90 tablet 1   metFORMIN (GLUCOPHAGE-XR) 500 MG 24 hr tablet Take 1 tablet (500 mg total) by mouth 2 (two) times daily with a meal. 120 tablet 3   Olopatadine HCl 0.2 % SOLN Apply 1 drop to eye daily as needed. 2.5 mL 3   pantoprazole (PROTONIX) 40 MG tablet Take 40 mg by mouth daily before breakfast.     No current facility-administered medications for this visit.   No results found.  Review of Systems:   A ROS was performed including pertinent positives and negatives as documented in the HPI.  Physical Exam :   Constitutional: NAD and appears stated age Neurological: Alert and oriented Psych: Appropriate affect and cooperative There were no vitals taken for this visit.   Comprehensive Musculoskeletal Exam:    She has a positive straight leg raise on the right without any discernible foot drop or weakness in the right leg.  Reflexes are symmetric.  Imaging:   Xray (4 views lumbar spine): Significant L4 on L5 spondylolisthesis   I personally reviewed and interpreted the radiographs.   Assessment:   57 y.o. female with spondylolisthesis of the lumbar spine in the setting of previous laminectomy.  Overall at this time I do believe that she unfortunately may require a revision instrumentation in order to stabilize this.  In the meantime I would like to obtain an MRI to further assess sites of impingement of her lumbar spine and plan for referral to my partner Dr. Christell Constant for further discussion of this  Plan :    -Plan for MRI lumbar spine and referral for Dr. Christell Constant for discussion of possible revision fusion     I personally saw and evaluated the patient, and participated in the management and treatment plan.  Huel Cote,  MD Attending Physician, Orthopedic Surgery  This document was dictated using Dragon voice recognition software. A reasonable attempt at proof reading has been made to minimize errors.

## 2023-06-05 NOTE — Telephone Encounter (Signed)
Patient and daughters requested transfer of care from [facility] to Dr. Erroll Luna via a requested through practice administrator.  Care switched and appointment made with Claudean Kinds

## 2023-06-12 ENCOUNTER — Institutional Professional Consult (permissible substitution): Payer: Commercial Managed Care - HMO | Admitting: Plastic Surgery

## 2023-06-17 ENCOUNTER — Other Ambulatory Visit: Payer: Commercial Managed Care - HMO

## 2023-06-19 ENCOUNTER — Other Ambulatory Visit: Payer: Commercial Managed Care - HMO

## 2023-06-22 ENCOUNTER — Ambulatory Visit
Admission: EM | Admit: 2023-06-22 | Discharge: 2023-06-22 | Disposition: A | Discharge status: Home / Self Care | Payer: Commercial Managed Care - HMO | Attending: Internal Medicine | Admitting: Internal Medicine

## 2023-06-22 DIAGNOSIS — R051 Acute cough: Secondary | ICD-10-CM | POA: Diagnosis present

## 2023-06-22 DIAGNOSIS — B349 Viral infection, unspecified: Secondary | ICD-10-CM | POA: Diagnosis present

## 2023-06-22 DIAGNOSIS — U071 COVID-19: Secondary | ICD-10-CM | POA: Diagnosis not present

## 2023-06-22 MED ORDER — IPRATROPIUM BROMIDE 0.03 % NA SOLN
2.0000 | Freq: Two times a day (BID) | NASAL | 0 refills | Status: AC
Start: 2023-06-22 — End: ?

## 2023-06-22 MED ORDER — BENZONATATE 200 MG PO CAPS
200.0000 mg | ORAL_CAPSULE | Freq: Three times a day (TID) | ORAL | 0 refills | Status: DC | PRN
Start: 2023-06-22 — End: 2023-11-08

## 2023-06-22 NOTE — Discharge Instructions (Signed)
The clinic will contact you with results of the COVID testing done today if positive.  You may start Tessalon as needed for cough.  Atrovent nasal spray twice daily for nasal congestion.  Lots of rest and fluids.  Please follow-up with your PCP if your symptoms do not improve.  Please go to the emergency room for any worsening symptoms.  I hope you feel better soon!

## 2023-06-22 NOTE — ED Triage Notes (Signed)
Pt presents to UC w/ c/o headache, sore throat, nasal congestion x3 days. Pt has tried tylenol and cetrizine without relief.   Interpreter Reuel Boom [phone]

## 2023-06-22 NOTE — ED Provider Notes (Signed)
[facility]    CSN: [id number] Arrival date & time: 06/22/23  0911      History   Chief Complaint No chief complaint on file.   HPI Kendra Rodriguez is a 57 y.o. female  presents for evaluation of URI symptoms for 3 days.  To rotation line was used as patient speaks [place].  Patient reports associated symptoms of cough, congestion, headache, sore throat. Denies N/V/D, fevers, ear pain, body aches, shortness of breath. Patient does not have a hx of asthma or smoking.  Granddaughter has similar symptoms.  Pt has taken allergy medicine OTC for symptoms. Pt has no other concerns at this time.   HPI  Past Medical History:  Diagnosis Date   Acute sinusitis    Allergy    Anxiety    Depression    NO MEDICATIONS - DOING OK NOW   Dizziness    Ear ache    RIGHT --HX OF MULTIPLE EAR INFECTIONS AS A CHILD    GERD (gastroesophageal reflux disease)    Headache(784.0)    MIGRAINES   Leg pain, left    with swelling   Lumbar stenosis    PAIN BACK AND LEFT LEG AND NUMBNESS LEG AND FOOT   Peripheral vascular disease (HCC)    HX OF TREATMENT FOR VARICOSE VEINS RIGHT LEG   Thyroid disease    TOLD BLOOD LEVELS SLIGHTLY ABNORMAL - BUT NOT REQUIRED MEDICATION    Patient Active Problem List   Diagnosis Date Noted   Dysuria 03/09/2023   Chronic pelvic pain in female 03/09/2023   Chronic bilateral low back pain without sciatica 03/09/2023   Primary osteoarthritis of left knee 09/08/2022   Vitamin D deficiency 09/08/2022   Hypothyroidism 11/29/2021   Body mass index (BMI) of 35.0-35.9 in adult 06/04/2020   History of migraine headaches 12/06/2019   Dyslipidemia 06/30/2019   GERD with esophagitis 06/30/2019   Chronic vertigo 04/13/2018   Prediabetes 10/17/2015   Spinal stenosis, lumbar region, with neurogenic claudication 10/24/2014    Past Surgical History:  Procedure Laterality Date   BACK SURGERY     bladder lift     AT SAME TIME OF HYSTERECTOMY   BRAVO PH STUDY N/A  07/26/2021   Procedure: BRAVO PH STUDY;  Surgeon: Jeani Hawking, MD;  Location: [facility];  Service: Endoscopy;  Laterality: N/A;   CHOLECYSTECTOMY     ESOPHAGOGASTRODUODENOSCOPY (EGD) WITH PROPOFOL N/A 07/26/2021   Procedure: ESOPHAGOGASTRODUODENOSCOPY (EGD) WITH PROPOFOL;  Surgeon: Jeani Hawking, MD;  Location: [facility];  Service: Endoscopy;  Laterality: N/A;   LUMBAR LAMINECTOMY/DECOMPRESSION MICRODISCECTOMY Left 10/24/2014   Procedure: COMPLETE LUMBAR DECOMPRESSION L4-L5 CENTRAL AND MICRODISCECTOMY L4-L5 LEFT;  Surgeon: Jacki Cones, MD;  Location: [facility];  Service: Orthopedics;  Laterality: Left;   POLYPECTOMY  07/26/2021   Procedure: POLYPECTOMY;  Surgeon: Jeani Hawking, MD;  Location: [facility];  Service: Endoscopy;;   VAGINAL HYSTERECTOMY      OB History     Gravida  5   Para      Term      Preterm      AB      Living  5      SAB      IAB      Ectopic      Multiple      Live Births  5            Home Medications    Prior to Admission medications   Medication Sig Start Date End  Date Taking? Authorizing Provider  benzonatate (TESSALON) 200 MG capsule Take 1 capsule (200 mg total) by mouth 3 (three) times daily as needed. 06/22/23  Yes Radford Pax, NP  ipratropium (ATROVENT) 0.03 % nasal spray Place 2 sprays into both nostrils every 12 (twelve) hours. 06/22/23  Yes Radford Pax, NP  ALPRAZolam Prudy Feeler) 0.5 MG tablet Take 1 tablet (0.5 mg total) by mouth daily as needed for anxiety. 12/31/22   [name], MD  atorvastatin (LIPITOR) 40 MG tablet TAKE 1 TABLET(40 MG) BY MOUTH DAILY 04/21/23   [name], MD  cetirizine (ZYRTEC ALLERGY) 10 MG tablet Take 1 tablet (10 mg total) by mouth daily. 02/04/23   [name], PA-C  cyclobenzaprine (FLEXERIL) 10 MG tablet Take 1 tablet (10 mg total) by mouth 2 (two) times daily as needed for muscle spasms. 05/18/23   Smitty Knudsen, PA-C  famotidine (PEPCID) 20 MG tablet Take 1 tablet (20 mg  total) by mouth 2 (two) times daily. 02/04/23   [name], PA-C  fluticasone (FLONASE) 50 MCG/ACT nasal spray SHAKE LIQUID AND USE 1 SPRAY IN EACH NOSTRIL DAILY 04/09/23   [name], MD  levothyroxine (SYNTHROID) 50 MCG tablet TAKE 1 TABLET(50 MCG) BY MOUTH DAILY 05/21/23   Altamese Foyil, MD  metFORMIN (GLUCOPHAGE-XR) 500 MG 24 hr tablet Take 1 tablet (500 mg total) by mouth 2 (two) times daily with a meal. 05/21/23   Motwani, Komal, MD  Olopatadine HCl 0.2 % SOLN Apply 1 drop to eye daily as needed. 02/04/23   [name], PA-C  pantoprazole (PROTONIX) 40 MG tablet Take 40 mg by mouth daily before breakfast.    [provider]    Family History Family History  Problem Relation Age of Onset   Hypertension Mother    Arthritis Mother    Diabetes Mother    Hyperlipidemia Mother    Arthritis Sister    Rheum arthritis Brother    Autoimmune disease Brother    Lung disease Brother    Arthritis Sister    Gout Son    High Cholesterol Daughter    Thyroid disease Neg Hx     Social History Social History   Tobacco Use   Smoking status: Never   Smokeless tobacco: Never  Vaping Use   Vaping status: Never Used  Substance Use Topics   Alcohol use: No   Drug use: Never     Allergies   Patient has no known allergies.   Review of Systems Review of Systems  HENT:  Positive for congestion and sore throat.   Respiratory:  Positive for cough.   Neurological:  Positive for headaches.     Physical Exam Triage Vital Signs ED Triage Vitals  Encounter Vitals Group     BP 06/22/23 0921 128/81     Systolic BP Percentile --      Diastolic BP Percentile --      Pulse Rate 06/22/23 0921 82     Resp 06/22/23 0921 16     Temp 06/22/23 0921 98.3 F (36.8 C)     Temp Source 06/22/23 0921 Oral     SpO2 06/22/23 0921 96 %     Weight --      Height --      Head Circumference --      Peak Flow --      Pain Score 06/22/23 0925 5     Pain Loc --      Pain Education --  Exclude from Growth Chart --    No data found.  Updated Vital Signs BP 128/81 (BP Location: Right Arm)   Pulse 82   Temp 98.3 F (36.8 C) (Oral)   Resp 16   SpO2 96%   Visual Acuity Right Eye Distance:   Left Eye Distance:   Bilateral Distance:    Right Eye Near:   Left Eye Near:    Bilateral Near:     Physical Exam Vitals and nursing note reviewed.  Constitutional:      General: She is not in acute distress.    Appearance: She is well-developed. She is not ill-appearing.  HENT:     Head: Normocephalic and atraumatic.     Right Ear: Tympanic membrane and ear canal normal.     Left Ear: Tympanic membrane and ear canal normal.     Nose: Congestion present.     Mouth/Throat:     Mouth: Mucous membranes are moist.     Pharynx: Oropharynx is clear. Uvula midline. Posterior oropharyngeal erythema present.     Tonsils: No tonsillar exudate or tonsillar abscesses.  Eyes:     Conjunctiva/sclera: Conjunctivae normal.     Pupils: Pupils are equal, round, and reactive to light.  Cardiovascular:     Rate and Rhythm: Normal rate and regular rhythm.     Heart sounds: Normal heart sounds.  Pulmonary:     Effort: Pulmonary effort is normal.     Breath sounds: Normal breath sounds.  Musculoskeletal:     Cervical back: Normal range of motion and neck supple.  Lymphadenopathy:     Cervical: No cervical adenopathy.  Skin:    General: Skin is warm and dry.  Neurological:     General: No focal deficit present.     Mental Status: She is alert and oriented to person, place, and time.  Psychiatric:        Mood and Affect: Mood normal.        Behavior: Behavior normal.      UC Treatments / Results  Labs (all labs ordered are listed, but only abnormal results are displayed) Labs Reviewed  SARS CORONAVIRUS 2 (TAT 6-24 HRS)    EKG   Radiology No results found.  Procedures Procedures (including critical care time)  Medications Ordered in UC Medications - No data to  display  Initial Impression / Assessment and Plan / UC Course  I have reviewed the triage vital signs and the nursing notes.  Pertinent labs & imaging results that were available during my care of the patient were reviewed by me and considered in my medical decision making (see chart for details).     Reviewed exam and symptoms with patient.  No red flags.  Discussed viral illness and symptomatic treatment.  COVID PCR and will contact if positive.  Atrovent nasal spray and Tessalon as needed.  Lots of rest and fluids.  PCP follow-up if symptoms do not improve.  ER precautions reviewed and patient verbalized understanding. Final Clinical Impressions(s) / UC Diagnoses   Final diagnoses:  Acute cough  Viral illness     Discharge Instructions      The clinic will contact you with results of the COVID testing done today if positive.  You may start Tessalon as needed for cough.  Atrovent nasal spray twice daily for nasal congestion.  Lots of rest and fluids.  Please follow-up with your PCP if your symptoms do not improve.  Please go to the emergency room for any worsening  symptoms.  I hope you feel better soon!    ED Prescriptions     Medication Sig Dispense Auth. Provider   benzonatate (TESSALON) 200 MG capsule Take 1 capsule (200 mg total) by mouth 3 (three) times daily as needed. 20 capsule Radford Pax, NP   ipratropium (ATROVENT) 0.03 % nasal spray Place 2 sprays into both nostrils every 12 (twelve) hours. 30 mL Radford Pax, NP      PDMP not reviewed this encounter.   Radford Pax, NP 06/22/23 [phone]

## 2023-06-23 ENCOUNTER — Other Ambulatory Visit: Payer: Self-pay | Admitting: Emergency Medicine

## 2023-06-23 ENCOUNTER — Encounter: Payer: Self-pay | Admitting: Emergency Medicine

## 2023-06-23 DIAGNOSIS — U071 COVID-19: Secondary | ICD-10-CM

## 2023-06-23 LAB — SARS CORONAVIRUS 2 (TAT 6-24 HRS): SARS Coronavirus 2: POSITIVE — AB

## 2023-06-23 MED ORDER — NIRMATRELVIR/RITONAVIR (PAXLOVID)TABLET
3.0000 | ORAL_TABLET | Freq: Two times a day (BID) | ORAL | 0 refills | Status: AC
Start: 2023-06-23 — End: 2023-06-28

## 2023-06-23 NOTE — Telephone Encounter (Signed)
Prescription for Paxlovid sent to pharmacy of record today.  Thanks.

## 2023-06-26 ENCOUNTER — Telehealth: Payer: Self-pay | Admitting: Orthopaedic Surgery

## 2023-06-26 NOTE — Telephone Encounter (Signed)
RC to patient's daughter stating it is reasonable to keep her appointment with Dr. Christell Constant to establish care especially with her mom's extensive surgical history. Informed her Dr. Christell Constant may have additional tests to order to best treat her symptoms. No further questions and patient's daughter voiced understanding

## 2023-06-26 NOTE — Telephone Encounter (Signed)
Patient's daughter called. The MRI was denied. Does patient need to keep the appointment with Dr. Christell Constant?

## 2023-07-02 ENCOUNTER — Ambulatory Visit: Payer: Commercial Managed Care - HMO | Admitting: Orthopedic Surgery

## 2023-07-02 ENCOUNTER — Encounter: Payer: Self-pay | Admitting: Orthopedic Surgery

## 2023-07-02 VITALS — BP 114/75 | HR 73 | Ht 61.0 in | Wt 193.0 lb

## 2023-07-02 DIAGNOSIS — M5416 Radiculopathy, lumbar region: Secondary | ICD-10-CM | POA: Diagnosis not present

## 2023-07-02 MED ORDER — PREGABALIN 75 MG PO CAPS: 75.0000 mg | ORAL_CAPSULE | Freq: Two times a day (BID) | ORAL | 0 refills | Status: AC

## 2023-07-02 NOTE — Progress Notes (Signed)
Orthopedic Spine Surgery Office Note  Assessment: Patient is a 57 y.o. female with low back pain that radiates into the bilateral lower extremities (R>L) at the anterolateral leg. Has a spondylolisthesis at L4/5.    Plan: -Explained that initially conservative treatment is tried as a significant number of patients may experience relief with these treatment modalities. Discussed that the conservative treatments include:  -activity modification  -physical therapy  -over the counter pain medications  -medrol dosepak  -lumbar steroid injections -Patient has tried PT, acupuncture, Tylenol, NSAIDs -Prescribed Lyrica for additional pain relief -If she is not doing any better at her next visit, will order MRI of the lumbar spine to evaluate for radiculopathy -Patient should return to office in 6 weeks, x-rays at next visit: None   Patient expressed understanding of the plan and all questions were answered to the patient's satisfaction.   ___________________________________________________________________________   History:  Patient is a 57 y.o. female who presents today for lumbar spine.  Patient has a history of a L4/5 decompression surgery done several years ago.  She said she had left leg pain before that surgery.  Her leg pain got better after surgery.  Then, within the last few years she had noted pain in her low back that radiates into the right lower extremity.  She feels it along the anterolateral aspect of the leg into the foot.  She feels it on the dorsal and plantar aspect of the foot.  The pain has gotten progressively worse with time.  She has now noticed pain in the left leg and foot as well.  It is not nearly as significant as the right side.  She has decreased sensation on the plantar aspect of the bilateral feet.  There is no trauma or injury that preceded the onset of this pain.   Weakness: Denies Symptoms of imbalance: Denies Paresthesias and numbness: Yes, on plantar aspect  of the feet.  No other numbness or paresthesias Bowel or bladder incontinence: Denies Saddle anesthesia: Denies  Treatments tried: PT, Tylenol, acupuncture, NSAIDs  Review of systems: Denies fevers and chills, night sweats, unexplained weight loss, history of cancer. Has had pain that wakes her at night  Past medical history: Depression/anxiety GERD Migraines HLD Varicose veins  Allergies: NKDA  Past surgical history:  L4/5 laminectomy and discectomy Polypectomy Hysterectomy Bladder lift Cholecystectomy  Social history: Denies use of nicotine product (smoking, vaping, patches, smokeless) Alcohol use: rare Denies recreational drug use   Physical Exam:  BMI of 35.6  General: no acute distress, appears stated age Neurologic: alert, answering questions appropriately, following commands Respiratory: unlabored breathing on room air, symmetric chest rise Psychiatric: appropriate affect, normal cadence to speech   MSK (spine):  -Strength exam      Left  Right EHL    5/5  5/5 TA    5/5  5/5 GSC    5/5  5/5 Knee extension  5/5  5/5 Hip flexion   5/5  5/5  -Sensory exam    Sensation intact to light touch in L3-S1 nerve distributions of bilateral lower extremities  -Achilles DTR: 2/4 on the left, 2/4 on the right -Patellar tendon DTR: 2/4 on the left, 2/4 on the right  -Straight leg raise: negative bilaterally -Femoral nerve stretch test: negative bilaterally -Clonus: no beats bilaterally  -Left hip exam: positive FABER, negative FADIR, negative stinchfield, no pain through range of motion -Right hip exam: positive FABER, negative FADIR, negative stinchfield, no pain through range of motion  Imaging: XRs of  the lumbar spine from 06/05/2023 were independently reviewed and interpreted, showing a spondylolisthesis at L4/5 that shifts about 2.86mm between flexion and extension views. Disc height loss at L4/5. No other significant degenerative changes seen. No fracture  or dislocation seen. PI of 75, LL of 73   Patient name: Kendra Rodriguez Patient MRN: [id number] Date of visit: 07/02/23

## 2023-07-14 ENCOUNTER — Ambulatory Visit
Admission: RE | Admit: 2023-07-14 | Discharge: 2023-07-14 | Disposition: A | Discharge status: Home / Self Care | Payer: Commercial Managed Care - HMO | Source: Ambulatory Visit | Attending: Family Medicine | Admitting: Family Medicine

## 2023-07-14 VITALS — BP 145/92 | HR 78 | Temp 98.6°F | Resp 18

## 2023-07-14 DIAGNOSIS — J3089 Other allergic rhinitis: Secondary | ICD-10-CM | POA: Diagnosis not present

## 2023-07-14 DIAGNOSIS — H65191 Other acute nonsuppurative otitis media, right ear: Secondary | ICD-10-CM

## 2023-07-14 MED ORDER — PREDNISONE 50 MG PO TABS: ORAL_TABLET | ORAL | 0 refills | Status: DC

## 2023-07-14 MED ORDER — POLYMYXIN B-TRIMETHOPRIM 10000-0.1 UNIT/ML-% OP SOLN: 1.0000 [drp] | Freq: Four times a day (QID) | OPHTHALMIC | 0 refills | Status: AC

## 2023-07-14 NOTE — ED Triage Notes (Signed)
Pt reports right ear pain, nasal congestion. headache, burning in eyes x 4 days. Reports she had COVID 3 weeks ago.

## 2023-07-14 NOTE — Discharge Instructions (Signed)
In addition to your daily Zyrtec and Flonase for allergies, you may add over-the-counter decongestant such as Coricidin HBP.  I have sent over prednisone to help with inflammation and an eyedrop additionally.  Follow-up for worsening symptoms.

## 2023-07-14 NOTE — ED Provider Notes (Signed)
[facility]    CSN: [id number] Arrival date & time: 07/14/23  0805      History   Chief Complaint Chief Complaint  Patient presents with   Ear Drainage    Ear pain . Headache , eye pain - Entered by patient   Appointment    0915    HPI Kendra Rodriguez is a 57 y.o. female.   Patient presenting today with 4-day history of right ear pain and pressure, nasal congestion, sinus pressure, headache, left eye irritation and redness, mild cough.  Denies fever, chills, body aches, chest pain, shortness of breath, abdominal pain, nausea vomiting or diarrhea.  So far taking daily seasonal allergy regimen of Zyrtec and Flonase, otherwise not trying anything for symptoms.  Had COVID 3 weeks ago and fully resolved from this prior to onset of the symptoms.  No new sick contacts recently.   Past Medical History:  Diagnosis Date   Acute sinusitis    Allergy    Anxiety    Depression    NO MEDICATIONS - DOING OK NOW   Dizziness    Ear ache    RIGHT --HX OF MULTIPLE EAR INFECTIONS AS A CHILD    GERD (gastroesophageal reflux disease)    Headache(784.0)    MIGRAINES   Leg pain, left    with swelling   Lumbar stenosis    PAIN BACK AND LEFT LEG AND NUMBNESS LEG AND FOOT   Peripheral vascular disease (HCC)    HX OF TREATMENT FOR VARICOSE VEINS RIGHT LEG   Thyroid disease    TOLD BLOOD LEVELS SLIGHTLY ABNORMAL - BUT NOT REQUIRED MEDICATION    Patient Active Problem List   Diagnosis Date Noted   Dysuria 03/09/2023   Chronic pelvic pain in female 03/09/2023   Chronic bilateral low back pain without sciatica 03/09/2023   Primary osteoarthritis of left knee 09/08/2022   Vitamin D deficiency 09/08/2022   Hypothyroidism 11/29/2021   Body mass index (BMI) of 35.0-35.9 in adult 06/04/2020   History of migraine headaches 12/06/2019   Dyslipidemia 06/30/2019   GERD with esophagitis 06/30/2019   Chronic vertigo 04/13/2018   Prediabetes 10/17/2015   Spinal stenosis, lumbar region, with  neurogenic claudication 10/24/2014    Past Surgical History:  Procedure Laterality Date   BACK SURGERY     bladder lift     AT SAME TIME OF HYSTERECTOMY   BRAVO PH STUDY N/A 07/26/2021   Procedure: BRAVO PH STUDY;  Surgeon: Jeani Hawking, MD;  Location: [facility];  Service: Endoscopy;  Laterality: N/A;   CHOLECYSTECTOMY     ESOPHAGOGASTRODUODENOSCOPY (EGD) WITH PROPOFOL N/A 07/26/2021   Procedure: ESOPHAGOGASTRODUODENOSCOPY (EGD) WITH PROPOFOL;  Surgeon: Jeani Hawking, MD;  Location: [facility];  Service: Endoscopy;  Laterality: N/A;   LUMBAR LAMINECTOMY/DECOMPRESSION MICRODISCECTOMY Left 10/24/2014   Procedure: COMPLETE LUMBAR DECOMPRESSION L4-L5 CENTRAL AND MICRODISCECTOMY L4-L5 LEFT;  Surgeon: Jacki Cones, MD;  Location: [facility];  Service: Orthopedics;  Laterality: Left;   POLYPECTOMY  07/26/2021   Procedure: POLYPECTOMY;  Surgeon: Jeani Hawking, MD;  Location: [facility];  Service: Endoscopy;;   VAGINAL HYSTERECTOMY     OB History     Gravida  5   Para      Term      Preterm      AB      Living  5      SAB      IAB      Ectopic      Multiple  Live Births  5           Home Medications    Prior to Admission medications   Medication Sig Start Date End Date Taking? Authorizing Provider  predniSONE (DELTASONE) 50 MG tablet Take 1 tab daily with breakfast for 3 days 07/14/23  Yes Particia Nearing, PA-C  trimethoprim-polymyxin b (POLYTRIM) ophthalmic solution Place 1 drop into the left eye every 6 (six) hours. 07/14/23  Yes Particia Nearing, PA-C  ALPRAZolam Prudy Feeler) 0.5 MG tablet Take 1 tablet (0.5 mg total) by mouth daily as needed for anxiety. 12/31/22   [name], MD  atorvastatin (LIPITOR) 40 MG tablet TAKE 1 TABLET(40 MG) BY MOUTH DAILY 04/21/23   [name], MD  benzonatate (TESSALON) 200 MG capsule Take 1 capsule (200 mg total) by mouth 3 (three) times daily as needed. 06/22/23   Radford Pax, NP  cetirizine  (ZYRTEC ALLERGY) 10 MG tablet Take 1 tablet (10 mg total) by mouth daily. 02/04/23   [name], PA-C  cyclobenzaprine (FLEXERIL) 10 MG tablet Take 1 tablet (10 mg total) by mouth 2 (two) times daily as needed for muscle spasms. 05/18/23   Smitty Knudsen, PA-C  famotidine (PEPCID) 20 MG tablet Take 1 tablet (20 mg total) by mouth 2 (two) times daily. 02/04/23   [name], PA-C  fluticasone (FLONASE) 50 MCG/ACT nasal spray SHAKE LIQUID AND USE 1 SPRAY IN EACH NOSTRIL DAILY 04/09/23   [name], MD  ipratropium (ATROVENT) 0.03 % nasal spray Place 2 sprays into both nostrils every 12 (twelve) hours. 06/22/23   Radford Pax, NP  levothyroxine (SYNTHROID) 50 MCG tablet TAKE 1 TABLET(50 MCG) BY MOUTH DAILY 05/21/23   Altamese [place], MD  metFORMIN (GLUCOPHAGE-XR) 500 MG 24 hr tablet Take 1 tablet (500 mg total) by mouth 2 (two) times daily with a meal. 05/21/23   Motwani, Komal, MD  Olopatadine HCl 0.2 % SOLN Apply 1 drop to eye daily as needed. 02/04/23   [name], PA-C  pantoprazole (PROTONIX) 40 MG tablet Take 40 mg by mouth daily before breakfast.    [provider]  pregabalin (LYRICA) 75 MG capsule Take 1 capsule (75 mg total) by mouth 2 (two) times daily. 07/02/23 08/13/23  London Sheer, MD    Family History Family History  Problem Relation Age of Onset   Hypertension Mother    Arthritis Mother    Diabetes Mother    Hyperlipidemia Mother    Arthritis Sister    Rheum arthritis Brother    Autoimmune disease Brother    Lung disease Brother    Arthritis Sister    Gout Son    High Cholesterol Daughter    Thyroid disease Neg Hx     Social History Social History   Tobacco Use   Smoking status: Never   Smokeless tobacco: Never  Vaping Use   Vaping status: Never Used  Substance Use Topics   Alcohol use: No   Drug use: Never     Allergies   Patient has no known allergies.   Review of Systems Review of Systems PER HPI  Physical Exam Triage Vital Signs ED  Triage Vitals  Encounter Vitals Group     BP 07/14/23 0852 (!) 145/92     Systolic BP Percentile --      Diastolic BP Percentile --      Pulse Rate 07/14/23 0852 78     Resp 07/14/23 0852 18     Temp 07/14/23  0852 98.6 F (37 C)     Temp Source 07/14/23 0852 Oral     SpO2 07/14/23 0852 98 %     Weight --      Height --      Head Circumference --      Peak Flow --      Pain Score 07/14/23 0901 5     Pain Loc --      Pain Education --      Exclude from Growth Chart --    No data found.  Updated Vital Signs BP (!) 145/92 (BP Location: Right Arm)   Pulse 78   Temp 98.6 F (37 C) (Oral)   Resp 18   SpO2 98%   Visual Acuity Right Eye Distance:   Left Eye Distance:   Bilateral Distance:    Right Eye Near:   Left Eye Near:    Bilateral Near:     Physical Exam Vitals and nursing note reviewed.  Constitutional:      Appearance: Normal appearance.  HENT:     Head: Atraumatic.     Right Ear: External ear normal.     Left Ear: Tympanic membrane and external ear normal.     Ears:     Comments: Middle ear effusion right    Nose: Rhinorrhea present.     Mouth/Throat:     Mouth: Mucous membranes are moist.     Pharynx: Posterior oropharyngeal erythema present.  Eyes:     Extraocular Movements: Extraocular movements intact.     Comments: Mild left conjunctival erythema  Cardiovascular:     Rate and Rhythm: Normal rate and regular rhythm.     Heart sounds: Normal heart sounds.  Pulmonary:     Effort: Pulmonary effort is normal.     Breath sounds: Normal breath sounds. No wheezing or rales.  Musculoskeletal:        General: Normal range of motion.     Cervical back: Normal range of motion and neck supple.  Skin:    General: Skin is warm and dry.  Neurological:     Mental Status: She is alert and oriented to person, place, and time.  Psychiatric:        Mood and Affect: Mood normal.        Thought Content: Thought content normal.    UC Treatments / Results   Labs (all labs ordered are listed, but only abnormal results are displayed) Labs Reviewed - No data to display  EKG   Radiology No results found.  Procedures Procedures (including critical care time)  Medications Ordered in UC Medications - No data to display  Initial Impression / Assessment and Plan / UC Course  I have reviewed the triage vital signs and the nursing notes.  Pertinent labs & imaging results that were available during my care of the patient were reviewed by me and considered in my medical decision making (see chart for details).     Suspect viral versus allergic cause, she is already taking Zyrtec and Flonase at home so continue this daily, add short course of prednisone for eustachian tube dysfunction on the right and Polytrim drops for the conjunctival irritation though this is possibly more allergic than bacterial.  Discussed warm compresses, good handwashing additionally.  Supportive the care medications at home care reviewed.  Return for worsening symptoms.  Final Clinical Impressions(s) / UC Diagnoses   Final diagnoses:  Seasonal allergic rhinitis due to other allergic trigger  Acute MEE (middle ear effusion),  right     Discharge Instructions      In addition to your daily Zyrtec and Flonase for allergies, you may add over-the-counter decongestant such as Coricidin HBP.  I have sent over prednisone to help with inflammation and an eyedrop additionally.  Follow-up for worsening symptoms.    ED Prescriptions     Medication Sig Dispense Auth. Provider   predniSONE (DELTASONE) 50 MG tablet Take 1 tab daily with breakfast for 3 days 3 tablet Particia Nearing, PA-C   trimethoprim-polymyxin b (POLYTRIM) ophthalmic solution Place 1 drop into the left eye every 6 (six) hours. 10 mL Particia Nearing, [place]      PDMP not reviewed this encounter.   Particia Nearing, [place] 07/14/23 1051

## 2023-07-30 ENCOUNTER — Other Ambulatory Visit: Payer: Self-pay

## 2023-07-30 ENCOUNTER — Encounter: Payer: Self-pay | Admitting: Family Medicine

## 2023-07-30 ENCOUNTER — Ambulatory Visit: Payer: Commercial Managed Care - HMO | Admitting: Family Medicine

## 2023-07-30 VITALS — BP 122/80 | HR 70 | Ht 61.0 in | Wt 192.0 lb

## 2023-07-30 DIAGNOSIS — M1712 Unilateral primary osteoarthritis, left knee: Secondary | ICD-10-CM

## 2023-07-30 DIAGNOSIS — G8929 Other chronic pain: Secondary | ICD-10-CM | POA: Diagnosis not present

## 2023-07-30 DIAGNOSIS — M25562 Pain in left knee: Secondary | ICD-10-CM | POA: Diagnosis not present

## 2023-07-30 NOTE — Progress Notes (Signed)
I, Stevenson Clinch, CMA acting as a scribe for Kendra Graham, MD.  NGELA Rodriguez is a 57 y.o. female who presents to [facility] today for exacerbation of her L knee pain. Pt was last seen for her L knee on 09/22/22 and she was given a steroid injection.  Today, pt reports worsening left knee sx over the past 1-2 weeks. Denies new injury. Intermittent swelling/puffiness. Locates pain to medial aspect of the knee. Mechanical sx present, pops with flexion. Feels some weakness in the knee with ascending stairs. Denies radicular sx. Has tried transdermal analgesic, HEP.   Dx imaging: 09/22/22 L knee XR 07/22/19 L knee XR             06/30/17 L knee XR             11/23/15 L knee XR  Pertinent review of systems: No fevers or chills  Relevant historical information: Hypothyroidism.  Spinal stenosis   Exam:  BP 122/80   Pulse 70   Ht 5\' 1"  (1.549 m)   Wt 192 lb (87.1 kg)   SpO2 99%   BMI 36.28 kg/m  General: Well Developed, well nourished, and in no acute distress.   MSK: Left knee mild effusion normal motion with crepitation.  Tender palpation medial joint line.    Lab and Radiology Results  Procedure: Real-time Ultrasound Guided Injection of left knee joint superior lateral patella space Device: Philips Affiniti 50G/GE Logiq Images permanently stored and available for review in PACS Verbal informed consent obtained.  Discussed risks and benefits of procedure. Warned about infection, bleeding, hyperglycemia damage to structures among others. Patient expresses understanding and agreement Time-out conducted.   Noted no overlying erythema, induration, or other signs of local infection.   Skin prepped in a sterile fashion.   Local anesthesia: Topical Ethyl chloride.   With sterile technique and under real time ultrasound guidance: 40 mg of Kenalog and 2 mL of Marcaine injected into knee joint. Fluid seen entering the joint capsule.   Completed without  difficulty   Pain immediately resolved suggesting accurate placement of the medication.   Advised to call if fevers/chills, erythema, induration, drainage, or persistent bleeding.   Images permanently stored and available for review in the ultrasound unit.  Impression: Technically successful ultrasound guided injection.        Assessment and Plan: 57 y.o. female with chronic left knee pain due to DJD predominantly medial compartment when seen on x-ray November 2023.  Plan for repeat steroid injection today.  Previous injection lasted until just recently which was 9 or 10 months.  Check back as needed.  Can repeat this injection every 3 months if needed.  She is currently seeing Dr. Christell Constant at [facility] for back pain and lumbar radiculopathy and looks like she is going to be having an MRI at some point in the near future.   PDMP not reviewed this encounter. Orders Placed This Encounter  Procedures   [place] LIMITED JOINT SPACE STRUCTURES LOW LEFT(NO LINKED CHARGES)    Order Specific Question:   Reason for Exam (SYMPTOM  OR DIAGNOSIS REQUIRED)    Answer:   left knee pain / OA    Order Specific Question:   Preferred imaging location?    Answer:   [facility]   No orders of the defined types were placed in this encounter.    Discussed warning signs or symptoms. Please see discharge instructions. Patient expresses understanding.  The above documentation has been reviewed and is accurate and complete Kendra Rodriguez, M.D.

## 2023-07-30 NOTE — Patient Instructions (Signed)
Thank you for coming in today.   You received an injection today. Seek immediate medical attention if the joint becomes red, extremely painful, or is oozing fluid.   Check back as needed

## 2023-08-13 ENCOUNTER — Ambulatory Visit: Payer: Managed Care, Other (non HMO) | Admitting: Orthopedic Surgery

## 2023-08-13 DIAGNOSIS — M5416 Radiculopathy, lumbar region: Secondary | ICD-10-CM

## 2023-08-13 MED ORDER — DIAZEPAM 5 MG PO TABS: 5.0000 mg | ORAL_TABLET | Freq: Once | ORAL | 0 refills | Status: AC | PRN

## 2023-08-13 NOTE — Progress Notes (Signed)
Orthopedic Spine Surgery Office Note   Assessment: Patient is a 57 y.o. female with low back pain that radiates into the bilateral lower extremities (R>L) at the anterolateral leg. Has a spondylolisthesis at L4/5.      Plan: -Patient has tried PT, acupuncture, Tylenol, NSAIDs, lyrica -Patient has now been doing Lyrica for the last 6 weeks under my management without any relief of her symptoms, so recommend MRI of the lumbar spine to evaluate for radiculopathy -Prescribed Valium for the MRI -Patient should return to office in 4 weeks, x-rays at next visit: none     Patient expressed understanding of the plan and all questions were answered to the patient's satisfaction.    ___________________________________________________________________________     History:   Patient is a 57 y.o. female who presents today for follow up on her lumbar spine.  Patient is still have a low back pain that radiates into her bilateral lower extremities.  She is more symptomatic on the right side.  She feels it goes along the lateral aspect of the thigh into the anterolateral leg.  The pain goes all the way to the level of the foot where she feels it on both the dorsal and plantar aspect.  On the left side, she feels it goes into the lateral hip and thigh.  She feels that the Lyrica has been mildly helpful but she is still having significant pain on a daily basis.  There have been no changes in her symptoms since the last time she was seen.     Treatments tried: PT, Tylenol, acupuncture, NSAIDs, lyrica    Physical Exam:  General: no acute distress, appears stated age Neurologic: alert, answering questions appropriately, following commands Respiratory: unlabored breathing on room air, symmetric chest rise Psychiatric: appropriate affect, normal cadence to speech     MSK (spine):   -Strength exam                                                   Left                  Right EHL                               5/5                  5/5 TA                                 5/5                  5/5 GSC                             5/5                  5/5 Knee extension            5/5                  5/5 Hip flexion                    5/5  5/5   -Sensory exam                           Sensation intact to light touch in L3-S1 nerve distributions of bilateral lower extremities     -Left hip exam: positive FABER, negative FADIR, negative stinchfield, no pain through range of motion -Right hip exam: positive FABER, negative FADIR, negative stinchfield, no pain through range of motion   Imaging: XRs of the lumbar spine from 06/05/2023 were previously independently reviewed and interpreted, showing a spondylolisthesis at L4/5 that shifts about 2.53mm between flexion and extension views. Disc height loss at L4/5. No other significant degenerative changes seen. No fracture or dislocation seen. PI of 75, LL of 73     Patient name: Kendra Rodriguez Patient MRN: [id number] Date of visit: 08/13/23

## 2023-08-24 ENCOUNTER — Inpatient Hospital Stay
Admission: RE | Admit: 2023-08-24 | Discharge: 2023-08-24 | Discharge status: Home / Self Care | Payer: Managed Care, Other (non HMO) | Source: Ambulatory Visit | Attending: Orthopedic Surgery

## 2023-08-24 DIAGNOSIS — M5416 Radiculopathy, lumbar region: Secondary | ICD-10-CM

## 2023-09-02 ENCOUNTER — Ambulatory Visit (INDEPENDENT_AMBULATORY_CARE_PROVIDER_SITE_OTHER): Payer: Managed Care, Other (non HMO) | Admitting: Emergency Medicine

## 2023-09-02 ENCOUNTER — Encounter: Payer: Self-pay | Admitting: Emergency Medicine

## 2023-09-02 VITALS — BP 118/88 | HR 85 | Temp 97.9°F | Ht 61.0 in | Wt 192.2 lb

## 2023-09-02 DIAGNOSIS — R6889 Other general symptoms and signs: Secondary | ICD-10-CM | POA: Diagnosis not present

## 2023-09-02 DIAGNOSIS — J22 Unspecified acute lower respiratory infection: Secondary | ICD-10-CM | POA: Insufficient documentation

## 2023-09-02 MED ORDER — DM-GUAIFENESIN ER 30-600 MG PO TB12: 1.0000 | ORAL_TABLET | Freq: Two times a day (BID) | ORAL | 1 refills | Status: DC

## 2023-09-02 MED ORDER — AZITHROMYCIN 250 MG PO TABS: ORAL_TABLET | ORAL | 0 refills | Status: DC

## 2023-09-02 NOTE — Assessment & Plan Note (Signed)
Advised to rest and stay well-hydrated. Symptom management discussed.  Recommend over-the-counter Mucinex DM around-the-clock.

## 2023-09-02 NOTE — Patient Instructions (Signed)
Infeccin de las vas respiratorias superiores en adultos Upper Respiratory Infection, Adult Una infeccin de las vas respiratorias superiores (IVRS) afecta la nariz, la garganta y las vas respiratorias superiores que llegan a los pulmones. El tipo ms comn de IVRS suele conocerse como el resfro comn. Las IVRS generalmente mejoran solas, sin tratamiento mdico. Cules son las causas? La causa de las IVRS es un germen (virus). Puede contraer estos grmenes de las siguientes maneras: Al aspirar las gotitas que una persona infectada elimina al toser o Engineering geologist. Al tocar algo que tiene el germen (est contaminado) y luego tocarse la boca, la nariz o los ojos. Qu incrementa el riesgo? Es ms propenso a Health and safety inspector IVRS si: Es muy pequeo o de edad muy [place]. Tiene contacto cercano con otros, como en el [place], la escuela o un centro de atencin mdica. Fuma. Tiene una enfermedad cardaca o pulmonar a largo plazo (crnica). Tiene debilitado el sistema encargado de combatir las enfermedades (sistema inmunitario). Tiene asma o alergias nasales. Tiene mucho estrs. Tiene un dficit nutricional. Cules son los signos o sntomas? Secrecin nasal o nariz tapada (congestin). Tos. Estornudos. Dolor de Advertising copywriter. Dolor de [place]. Sensacin de cansancio (fatiga). Grant Ruts. No querer comer tanto como lo hace habitualmente. Dolor en la frente, detrs de los ojos y por encima de los pmulos (dolor sinusal). Dolores musculares. Enrojecimiento o irritacin de los ojos. Presin en los odos o la cara. Cmo se trata? Las IVRS generalmente mejoran por s solas en un perodo de entre 7 y [address]. Los medicamentos no curan las IVRS, Biomedical engineer el mdico puede recomendarle ciertos medicamentos para ayudar a [employer], como por ejemplo: Medicamentos para la tos de [place]. Medicamentos para reducir la tos (antitusivos). La tos es un tipo de defensa contra las infecciones que ayuda a  Museum/gallery conservator la nariz, la garganta, la trquea y los pulmones (el sistema respiratorio). Tome estos medicamentos solamente como se lo haya indicado el mdico. Medicamentos para bajar la [place]. Siga estas instrucciones en su casa: Actividad Descanse todo lo que sea necesario. Si tiene fiebre, [employer], sin ir al Aleen Campi o a la escuela, hasta que ya no tenga fiebre, o hasta que el mdico le indique que puede regresar al Aleen Campi o a la escuela. Debe permanecer en su casa hasta que ya no pueda propagar (contagiar) la infeccin. Es posible que el mdico le indique que use una mascarilla para tener menos riesgo de propagar la infeccin. Para aliviar los sntomas Enjuguese la boca frecuentemente con una mezcla de agua con sal. Para preparar agua con sal, disuelva de  a 1 cucharadita (de 3 a 6 g) de sal en 1 taza (237 ml) de agua tibia. Use un humidificador de aire fro para agregar humedad al aire. Esto puede ayudarlo a que respire mejor. Comida y bebida  Beba suficiente lquido para mantener la orina de color amarillo plido. Tome sopas y caldos transparentes. Instrucciones generales  Use los medicamentos de venta libre y los recetados solamente como se lo haya indicado el mdico. No fume ni consuma ningn producto que contenga nicotina o tabaco. Si necesita ayuda para dejar de fumar, consulte al mdico. Evite estar cerca de personas que fuman (evite el humo ambiental de tabaco). Mantngase al da con todas las vacunas (inmunizaciones) y aplquese la vacuna contra la gripe todos los [place]. Concurra a todas las visitas de seguimiento. Cmo evitar contagiar la infeccin a otros  Lvese las manos con agua y jabn durante al menos 20  segundos. Use un desinfectante para manos si no dispone de [place] y [place]. Evite tocarse la boca, la cara, los ojos o la [place]. Tosa o estornude en un pauelo de papel o sobre su manga o codo. No tosa o estornude al aire ni se cubra la boca o la nariz con la  [place]. Comunquese con un mdico si: Siente que empeora o que no mejora. Tiene alguno de estos sntomas: Grant Ruts o escalofros. Mucosidad color marrn o roja en la nariz. Lquido amarillento o amarronado (Doctor, hospital de la Clinical cytogeneticist. Dolor en la cara, especialmente al inclinarse hacia adelante. Ganglios del cuello inflamados. Dolor al tragar. Zonas blancas en la parte de atrs de la garganta. Solicite ayuda de inmediato si: La falta de aire empeora. Los siguientes sntomas son muy intensos o constantes: Dolor de [place]. Dolor de odo. Dolor en la frente, detrs de los ojos y por encima de los pmulos (dolor sinusal). Dolor de pecho. Tiene una enfermedad pulmonar prolongada (crnica) junto con cualquiera de estos sntomas: Emitir sonidos de silbidos agudos al respirar, ms a menudo al exhalar (sibilancias). Tos prolongada (ms de [address]). Tos con sangre. Cambio en la mucosidad habitual. Tiene rigidez en el cuello. Tiene cambios en: La visin. La audicin. El razonamiento. [place] de nimo. Estos sntomas pueden Customer service manager. Solicite ayuda de inmediato. Llame al 911. No espere a ver si los sntomas desaparecen. No conduzca por sus propios medios [facility]. Resumen Una infeccin de las vas respiratorias superiores (IVRS) es causada por un germen (virus). El tipo ms comn de IVRS suele conocerse como el resfro comn. Una IVRS suele mejorar en el transcurso de 7 a 10 das. Use los medicamentos de venta libre y los recetados solamente como se lo haya indicado el mdico. Esta informacin no tiene Theme park manager el consejo del mdico. Asegrese de hacerle al mdico cualquier pregunta que tenga. Document Revised: 06/17/2021 Document Reviewed: 06/17/2021 Elsevier Patient Education  2024 [employer].

## 2023-09-02 NOTE — Assessment & Plan Note (Signed)
Viral upper respiratory infection with secondary bacterial infection Symptoms progressively getting worse.  Recommend daily azithromycin for the next 5 days Clinically stable.  No signs of pneumonia. Advised to rest and stay well-hydrated. Symptom management discussed. Advised to contact the office if no better or worse during the next several days

## 2023-09-02 NOTE — Progress Notes (Signed)
Kendra Rodriguez 57 y.o.   Chief Complaint  Patient presents with   Cough    Chest congestion, runny nose, pain in her ears started last Friday    HISTORY OF PRESENT ILLNESS: Acute problem visit today. This is a 57 y.o. female complaining of flulike symptoms that started last week progressively getting worse Has productive cough along with chest and sinus congestion Able to eat and drink.  Denies nausea or vomiting.  Denies abdominal pain or diarrhea. Denies chest pain or trouble breathing. No other complaints or medical concerns today.  Cough Associated symptoms include ear pain. Pertinent negatives include no chest pain, chills, fever, headaches, hemoptysis, rash or shortness of breath.     Prior to Admission medications   Medication Sig Start Date End Date Taking? Authorizing Provider  ALPRAZolam Prudy Feeler) 0.5 MG tablet Take 1 tablet (0.5 mg total) by mouth daily as needed for anxiety. 12/31/22  Yes Elmire Amrein, Eilleen Kempf, MD  atorvastatin (LIPITOR) 40 MG tablet TAKE 1 TABLET(40 MG) BY MOUTH DAILY 04/21/23  Yes Teoman Giraud, Eilleen Kempf, MD  benzonatate (TESSALON) 200 MG capsule Take 1 capsule (200 mg total) by mouth 3 (three) times daily as needed. 06/22/23  Yes Radford Pax, NP  cetirizine (ZYRTEC ALLERGY) 10 MG tablet Take 1 tablet (10 mg total) by mouth daily. 02/04/23  Yes [name], PA-C  cyclobenzaprine (FLEXERIL) 10 MG tablet Take 1 tablet (10 mg total) by mouth 2 (two) times daily as needed for muscle spasms. 05/18/23  Yes Maryanna Shape A, PA-C  diazepam (VALIUM) 5 MG tablet Take 1 tablet (5 mg total) by mouth once as needed for up to 1 dose (prior to MRI for anxiety). 08/13/23  Yes London Sheer, MD  famotidine (PEPCID) 20 MG tablet Take 1 tablet (20 mg total) by mouth 2 (two) times daily. 02/04/23  Yes [name], PA-C  fluticasone (FLONASE) 50 MCG/ACT nasal spray SHAKE LIQUID AND USE 1 SPRAY IN EACH NOSTRIL DAILY 04/09/23  Yes Press Casale, Eilleen Kempf, MD  ipratropium (ATROVENT) 0.03  % nasal spray Place 2 sprays into both nostrils every 12 (twelve) hours. 06/22/23  Yes Radford Pax, NP  levothyroxine (SYNTHROID) 50 MCG tablet TAKE 1 TABLET(50 MCG) BY MOUTH DAILY 05/21/23  Yes Motwani, Komal, MD  metFORMIN (GLUCOPHAGE-XR) 500 MG 24 hr tablet Take 1 tablet (500 mg total) by mouth 2 (two) times daily with a meal. 05/21/23  Yes Motwani, Komal, MD  Olopatadine HCl 0.2 % SOLN Apply 1 drop to eye daily as needed. 02/04/23  Yes [name], PA-C  pantoprazole (PROTONIX) 40 MG tablet Take 40 mg by mouth daily before breakfast.   Yes [provider]  predniSONE (DELTASONE) 50 MG tablet Take 1 tab daily with breakfast for 3 days 07/14/23  Yes Particia Nearing, PA-C  trimethoprim-polymyxin b (POLYTRIM) ophthalmic solution Place 1 drop into the left eye every 6 (six) hours. 07/14/23  Yes Particia Nearing, PA-C  pregabalin (LYRICA) 75 MG capsule Take 1 capsule (75 mg total) by mouth 2 (two) times daily. 07/02/23 08/13/23  London Sheer, MD    No Known Allergies  Patient Active Problem List   Diagnosis Date Noted   Chronic pain of left knee 07/30/2023   Dysuria 03/09/2023   Chronic pelvic pain in female 03/09/2023   Chronic bilateral low back pain without sciatica 03/09/2023   Primary osteoarthritis of left knee 09/08/2022   Vitamin D deficiency 09/08/2022   Hypothyroidism 11/29/2021   Body mass index (BMI) of 35.0-35.9  in adult 06/04/2020   History of migraine headaches 12/06/2019   Dyslipidemia 06/30/2019   GERD with esophagitis 06/30/2019   Chronic vertigo 04/13/2018   Prediabetes 10/17/2015   Spinal stenosis, lumbar region, with neurogenic claudication 10/24/2014    Past Medical History:  Diagnosis Date   Acute sinusitis    Allergy    Anxiety    Depression    NO MEDICATIONS - DOING OK NOW   Dizziness    Ear ache    RIGHT --HX OF MULTIPLE EAR INFECTIONS AS A CHILD    GERD (gastroesophageal reflux disease)    Headache(784.0)    MIGRAINES   Leg pain,  left    with swelling   Lumbar stenosis    PAIN BACK AND LEFT LEG AND NUMBNESS LEG AND FOOT   Peripheral vascular disease (HCC)    HX OF TREATMENT FOR VARICOSE VEINS RIGHT LEG   Thyroid disease    TOLD BLOOD LEVELS SLIGHTLY ABNORMAL - BUT NOT REQUIRED MEDICATION    Past Surgical History:  Procedure Laterality Date   BACK SURGERY     bladder lift     AT SAME TIME OF HYSTERECTOMY   BRAVO PH STUDY N/A 07/26/2021   Procedure: BRAVO PH STUDY;  Surgeon: Jeani Hawking, MD;  Location: [facility];  Service: Endoscopy;  Laterality: N/A;   CHOLECYSTECTOMY     ESOPHAGOGASTRODUODENOSCOPY (EGD) WITH PROPOFOL N/A 07/26/2021   Procedure: ESOPHAGOGASTRODUODENOSCOPY (EGD) WITH PROPOFOL;  Surgeon: Jeani Hawking, MD;  Location: [facility];  Service: Endoscopy;  Laterality: N/A;   LUMBAR LAMINECTOMY/DECOMPRESSION MICRODISCECTOMY Left 10/24/2014   Procedure: COMPLETE LUMBAR DECOMPRESSION L4-L5 CENTRAL AND MICRODISCECTOMY L4-L5 LEFT;  Surgeon: Jacki Cones, MD;  Location: [facility];  Service: Orthopedics;  Laterality: Left;   POLYPECTOMY  07/26/2021   Procedure: POLYPECTOMY;  Surgeon: Jeani Hawking, MD;  Location: [facility];  Service: Endoscopy;;   VAGINAL HYSTERECTOMY      Social History   Socioeconomic History   Marital status: Married    Spouse name: Not on file   Number of children: Not on file   Years of education: Not on file   Highest education level: Not on file  Occupational History   Not on file  Tobacco Use   Smoking status: Never   Smokeless tobacco: Never  Vaping Use   Vaping status: Never Used  Substance and Sexual Activity   Alcohol use: No   Drug use: Never   Sexual activity: Yes  Other Topics Concern   Not on file  Social History Narrative   ** Merged History Encounter **       Social Determinants of Health   Financial Resource Strain: Not on file  Food Insecurity: Not on file  Transportation Needs: Not on file  Physical Activity: Not on file  Stress: Not  on file  Social Connections: Not on file  Intimate Partner Violence: Not on file    Family History  Problem Relation Age of Onset   Hypertension Mother    Arthritis Mother    Diabetes Mother    Hyperlipidemia Mother    Arthritis Sister    Rheum arthritis Brother    Autoimmune disease Brother    Lung disease Brother    Arthritis Sister    Gout Son    High Cholesterol Daughter    Thyroid disease Neg Hx      Review of Systems  Constitutional: Negative.  Negative for chills and fever.  HENT:  Positive for congestion and ear pain.   Respiratory:  Positive for cough and sputum production. Negative for hemoptysis and shortness of breath.   Cardiovascular: Negative.  Negative for chest pain and palpitations.  Gastrointestinal:  Negative for abdominal pain, diarrhea, nausea and vomiting.  Genitourinary: Negative.  Negative for dysuria and hematuria.  Skin: Negative.  Negative for rash.  Neurological: Negative.  Negative for dizziness and headaches.  All other systems reviewed and are negative.   Today's Vitals   09/02/23 1355  BP: 118/88  Pulse: 85  Temp: 97.9 F (36.6 C)  TempSrc: Oral  SpO2: 99%  Weight: 192 lb 4 oz (87.2 kg)  Height: 5\' 1"  (1.549 m)   Body mass index is 36.33 kg/m.   Physical Exam Vitals reviewed.  Constitutional:      Appearance: Normal appearance.  HENT:     Head: Normocephalic.     Right Ear: Tympanic membrane, ear canal and external ear normal.     Left Ear: Tympanic membrane, ear canal and external ear normal.     Mouth/Throat:     Mouth: Mucous membranes are moist.     Pharynx: Oropharynx is clear.  Eyes:     Extraocular Movements: Extraocular movements intact.     Pupils: Pupils are equal, round, and reactive to light.  Cardiovascular:     Rate and Rhythm: Normal rate and regular rhythm.     Pulses: Normal pulses.     Heart sounds: Normal heart sounds.  Pulmonary:     Effort: Pulmonary effort is normal.     Breath sounds: Normal  breath sounds.  Musculoskeletal:     Cervical back: No tenderness.  Lymphadenopathy:     Cervical: No cervical adenopathy.  Skin:    General: Skin is warm and dry.  Neurological:     General: No focal deficit present.     Mental Status: She is alert and oriented to person, place, and time.  Psychiatric:        Mood and Affect: Mood normal.        Behavior: Behavior normal.      ASSESSMENT & PLAN: A total of 32 minutes was spent with the patient and counseling/coordination of care regarding preparing for this visit, review of most recent office visit notes, review of chronic medical conditions under management, review of all medications, diagnosis of lower respiratory infection and need for antibiotics, prognosis, ED precautions, documentation and need for follow-up.  Problem List Items Addressed This Visit       Respiratory   Lower respiratory infection - Primary    Viral upper respiratory infection with secondary bacterial infection Symptoms progressively getting worse.  Recommend daily azithromycin for the next 5 days Clinically stable.  No signs of pneumonia. Advised to rest and stay well-hydrated. Symptom management discussed. Advised to contact the office if no better or worse during the next several days      Relevant Medications   azithromycin (ZITHROMAX) 250 MG tablet     Other   Flu-like symptoms    Advised to rest and stay well-hydrated. Symptom management discussed.  Recommend over-the-counter Mucinex DM around-the-clock.      Relevant Medications   dextromethorphan-guaiFENesin (MUCINEX DM) 30-600 MG 12hr tablet   Patient Instructions  Infeccin de las vas respiratorias superiores en adultos Upper Respiratory Infection, Adult Una infeccin de las vas respiratorias superiores (IVRS) afecta la nariz, la garganta y las vas respiratorias superiores que llegan a los pulmones. El tipo ms comn de IVRS suele conocerse como el resfro comn. Las IVRS  generalmente mejoran solas,  sin tratamiento mdico. Cules son las causas? La causa de las IVRS es un germen (virus). Puede contraer estos grmenes de las siguientes maneras: Al aspirar las gotitas que una persona infectada elimina al toser o Engineering geologist. Al tocar algo que tiene el germen (est contaminado) y luego tocarse la boca, la nariz o los ojos. Qu incrementa el riesgo? Es ms propenso a Health and safety inspector IVRS si: Es muy pequeo o de edad muy [place]. Tiene contacto cercano con otros, como en el [place], la escuela o un centro de atencin mdica. Fuma. Tiene una enfermedad cardaca o pulmonar a largo plazo (crnica). Tiene debilitado el sistema encargado de combatir las enfermedades (sistema inmunitario). Tiene asma o alergias nasales. Tiene mucho estrs. Tiene un dficit nutricional. Cules son los signos o sntomas? Secrecin nasal o nariz tapada (congestin). Tos. Estornudos. Dolor de Advertising copywriter. Dolor de [place]. Sensacin de cansancio (fatiga). Grant Ruts. No querer comer tanto como lo hace habitualmente. Dolor en la frente, detrs de los ojos y por encima de los pmulos (dolor sinusal). Dolores musculares. Enrojecimiento o irritacin de los ojos. Presin en los odos o la cara. Cmo se trata? Las IVRS generalmente mejoran por s solas en un perodo de entre 7 y [address]. Los medicamentos no curan las IVRS, Biomedical engineer el mdico puede recomendarle ciertos medicamentos para ayudar a [employer], como por ejemplo: Medicamentos para la tos de [place]. Medicamentos para reducir la tos (antitusivos). La tos es un tipo de defensa contra las infecciones que ayuda a Museum/gallery conservator la nariz, la garganta, la trquea y los pulmones (el sistema respiratorio). Tome estos medicamentos solamente como se lo haya indicado el mdico. Medicamentos para bajar la [place]. Siga estas instrucciones en su casa: Actividad Descanse todo lo que sea necesario. Si tiene fiebre, [employer], sin  ir al Aleen Campi o a la escuela, hasta que ya no tenga fiebre, o hasta que el mdico le indique que puede regresar al Aleen Campi o a la escuela. Debe permanecer en su casa hasta que ya no pueda propagar (contagiar) la infeccin. Es posible que el mdico le indique que use una mascarilla para tener menos riesgo de propagar la infeccin. Para aliviar los sntomas Enjuguese la boca frecuentemente con una mezcla de agua con sal. Para preparar agua con sal, disuelva de  a 1 cucharadita (de 3 a 6 g) de sal en 1 taza (237 ml) de agua tibia. Use un humidificador de aire fro para agregar humedad al aire. Esto puede ayudarlo a que respire mejor. Comida y bebida  Beba suficiente lquido para mantener la orina de color amarillo plido. Tome sopas y caldos transparentes. Instrucciones generales  Use los medicamentos de venta libre y los recetados solamente como se lo haya indicado el mdico. No fume ni consuma ningn producto que contenga nicotina o tabaco. Si necesita ayuda para dejar de fumar, consulte al mdico. Evite estar cerca de personas que fuman (evite el humo ambiental de tabaco). Mantngase al da con todas las vacunas (inmunizaciones) y aplquese la vacuna contra la gripe todos los [place]. Concurra a todas las visitas de seguimiento. Cmo evitar contagiar la infeccin a otros  [employer] con agua y jabn durante al menos 20 segundos. Use un desinfectante para manos si no dispone de [place] y [place]. Evite tocarse la boca, la cara, los ojos o la [place]. Tosa o estornude en un pauelo de papel o sobre su manga o codo. No tosa o estornude al aire ni se cubra la boca o la [place]  con la mano. Comunquese con un mdico si: Siente que empeora o que no mejora. Tiene alguno de estos sntomas: Grant Ruts o escalofros. Mucosidad color marrn o roja en la nariz. Lquido amarillento o amarronado (Doctor, hospital de la Clinical cytogeneticist. Dolor en la cara, especialmente al inclinarse hacia adelante. Ganglios del  cuello inflamados. Dolor al tragar. Zonas blancas en la parte de atrs de la garganta. Solicite ayuda de inmediato si: La falta de aire empeora. Los siguientes sntomas son muy intensos o constantes: Dolor de [place]. Dolor de odo. Dolor en la frente, detrs de los ojos y por encima de los pmulos (dolor sinusal). Dolor de pecho. Tiene una enfermedad pulmonar prolongada (crnica) junto con cualquiera de estos sntomas: Emitir sonidos de silbidos agudos al respirar, ms a menudo al exhalar (sibilancias). Tos prolongada (ms de [address]). Tos con sangre. Cambio en la mucosidad habitual. Tiene rigidez en el cuello. Tiene cambios en: La visin. La audicin. El razonamiento. El [place] de nimo. Estos sntomas pueden Customer service manager. Solicite ayuda de inmediato. Llame al 911. No espere a ver si los sntomas desaparecen. No conduzca por sus propios medios [facility] hospital. Resumen Una infeccin de las vas respiratorias superiores (IVRS) es causada por un germen (virus). El tipo ms comn de IVRS suele conocerse como el resfro comn. Una IVRS suele mejorar en el transcurso de 7 a 10 das. Use los medicamentos de venta libre y los recetados solamente como se lo haya indicado el mdico. Esta informacin no tiene Theme park manager el consejo del mdico. Asegrese de hacerle al mdico cualquier pregunta que tenga. Document Revised: 06/17/2021 Document Reviewed: 06/17/2021 Elsevier Patient Education  2024 [employer]    Edwina Barth, MD [facility]

## 2023-09-10 ENCOUNTER — Ambulatory Visit: Payer: Managed Care, Other (non HMO) | Admitting: Orthopedic Surgery

## 2023-09-10 DIAGNOSIS — M5416 Radiculopathy, lumbar region: Secondary | ICD-10-CM

## 2023-09-10 NOTE — Progress Notes (Signed)
Orthopedic Spine Surgery Office Note   Assessment: Patient is a 57 y.o. female with low back pain that radiates into the bilateral lower extremities (R>L) at the anterolateral leg. Has a spondylolisthesis at L4/5 with foraminal and lateral recess stenosis at that level.     Plan: -Patient has tried PT, acupuncture, Tylenol, NSAIDs, lyrica -Recommended diagnostic/therapeutic injection with Dr. Alvester Morin.  Referral provided today -Told her if she gets good and lasting relief with the injection, we can continue that as a treatment -If she gets good relief but it does not last, then I would discuss operative management as an option for her pain -Patient should return to office in 6 weeks, x-rays at next visit: none     Patient expressed understanding of the plan and all questions were answered to the patient's satisfaction.    ___________________________________________________________________________     History:   Patient is a 57 y.o. female who presents today for follow up on her lumbar spine.  She is still having pain in her low back that radiates into her bilateral lower extremities.  She feels more pain going into the right leg.  She feels the pain going along the lateral aspect of her thighs into the anterolateral legs.  She has felt that the Lyrica helps make her pain tolerable during the day but as soon as the medication wears off the pain returns and is just as severe as it was before taking the medication.  At its worst, she rates the pain as a 7 out of 10.  She has not developed any new symptoms since she was last seen in the office.     Treatments tried: PT, Tylenol, acupuncture, NSAIDs, lyrica     Physical Exam:   General: no acute distress, appears stated age Neurologic: alert, answering questions appropriately, following commands Respiratory: unlabored breathing on room air, symmetric chest rise Psychiatric: appropriate affect, normal cadence to speech     MSK (spine):    -Strength exam                                                   Left                  Right EHL                              5/5                  5/5 TA                                 5/5                  5/5 GSC                             5/5                  5/5 Knee extension            5/5                  5/5 Hip flexion  5/5                  5/5   -Sensory exam                           Sensation intact to light touch in L3-S1 nerve distributions of bilateral lower extremities     Imaging: XRs of the lumbar spine from 06/05/2023 were previously independently reviewed and interpreted, showing a spondylolisthesis at L4/5 that shifts about 2.70mm between flexion and extension views. Disc height loss at L4/5. No other significant degenerative changes seen. No fracture or dislocation seen. PI of 75, LL of 73  MRI of the lumbar spine from 08/24/2023 was independent reviewed and interpreted, showing spondylolisthesis at L4/5.  Bilateral foraminal stenosis at L4/5.  DDD at L4/5 with laminectomy defect at that level.  Hypertrophic facets with lateral recess stenosis at L4/5 (R>L).  No other significant stenosis seen.     Patient name: Kendra Rodriguez Patient MRN: [id number] Date of visit: 09/10/23

## 2023-09-14 ENCOUNTER — Other Ambulatory Visit: Payer: Commercial Managed Care - HMO

## 2023-09-15 ENCOUNTER — Ambulatory Visit (INDEPENDENT_AMBULATORY_CARE_PROVIDER_SITE_OTHER): Payer: Managed Care, Other (non HMO) | Admitting: Emergency Medicine

## 2023-09-15 ENCOUNTER — Encounter: Payer: Self-pay | Admitting: Emergency Medicine

## 2023-09-15 VITALS — BP 128/88 | HR 65 | Temp 98.2°F | Ht 61.0 in | Wt 191.2 lb

## 2023-09-15 DIAGNOSIS — I7301 Raynaud's syndrome with gangrene: Secondary | ICD-10-CM | POA: Insufficient documentation

## 2023-09-15 NOTE — Progress Notes (Signed)
Kendra Rodriguez 57 y.o.   Chief Complaint  Patient presents with   Medical Management of Chronic Issues    Patient states her hands are purple looking at times, she states she is having some issues with there back.     HISTORY OF PRESENT ILLNESS: This is a 57 y.o. female complaining of intermittent episodes of fingers turning dusky with swelling and pain included. Unknown triggers.  Not long-lasting.  Not associated with any other symptoms. No other plaints or medical concerns today.  HPI   Prior to Admission medications   Medication Sig Start Date End Date Taking? Authorizing Provider  ALPRAZolam Prudy Feeler) 0.5 MG tablet Take 1 tablet (0.5 mg total) by mouth daily as needed for anxiety. 12/31/22  Yes [name], MD  atorvastatin (LIPITOR) 40 MG tablet TAKE 1 TABLET(40 MG) BY MOUTH DAILY 04/21/23  Yes Prestina Raigoza, Eilleen Kempf, MD  cetirizine (ZYRTEC ALLERGY) 10 MG tablet Take 1 tablet (10 mg total) by mouth daily. 02/04/23  Yes [name], PA-C  cyclobenzaprine (FLEXERIL) 10 MG tablet Take 1 tablet (10 mg total) by mouth 2 (two) times daily as needed for muscle spasms. 05/18/23  Yes Maryanna Shape A, PA-C  diazepam (VALIUM) 5 MG tablet Take 1 tablet (5 mg total) by mouth once as needed for up to 1 dose (prior to MRI for anxiety). 08/13/23  Yes London Sheer, MD  famotidine (PEPCID) 20 MG tablet Take 1 tablet (20 mg total) by mouth 2 (two) times daily. 02/04/23  Yes [name], PA-C  fluticasone (FLONASE) 50 MCG/ACT nasal spray SHAKE LIQUID AND USE 1 SPRAY IN EACH NOSTRIL DAILY 04/09/23  Yes Tyrel Lex, Eilleen Kempf, MD  ipratropium (ATROVENT) 0.03 % nasal spray Place 2 sprays into both nostrils every 12 (twelve) hours. 06/22/23  Yes Radford Pax, NP  levothyroxine (SYNTHROID) 50 MCG tablet TAKE 1 TABLET(50 MCG) BY MOUTH DAILY 05/21/23  Yes Motwani, Komal, MD  metFORMIN (GLUCOPHAGE-XR) 500 MG 24 hr tablet Take 1 tablet (500 mg total) by mouth 2 (two) times daily with a meal. 05/21/23  Yes  Motwani, Komal, MD  Olopatadine HCl 0.2 % SOLN Apply 1 drop to eye daily as needed. 02/04/23  Yes [name], PA-C  pantoprazole (PROTONIX) 40 MG tablet Take 40 mg by mouth daily before breakfast.   Yes [provider]  trimethoprim-polymyxin b (POLYTRIM) ophthalmic solution Place 1 drop into the left eye every 6 (six) hours. 07/14/23  Yes Particia Nearing, PA-C  azithromycin [facility]) 250 MG tablet Sig as indicated Patient not taking: Reported on 09/15/2023 09/02/23   [name], MD  benzonatate (TESSALON) 200 MG capsule Take 1 capsule (200 mg total) by mouth 3 (three) times daily as needed. Patient not taking: Reported on 09/15/2023 06/22/23   Radford Pax, NP  dextromethorphan-guaiFENesin [facility] DM) 30-600 MG 12hr tablet Take 1 tablet by mouth 2 (two) times daily. Patient not taking: Reported on 09/15/2023 09/02/23   [name], MD  predniSONE (DELTASONE) 50 MG tablet Take 1 tab daily with breakfast for 3 days Patient not taking: Reported on 09/15/2023 07/14/23   Particia Nearing, PA-C  pregabalin (LYRICA) 75 MG capsule Take 1 capsule (75 mg total) by mouth 2 (two) times daily. 07/02/23 08/13/23  London Sheer, MD    No Known Allergies  Patient Active Problem List   Diagnosis Date Noted   Flu-like symptoms 09/02/2023   Chronic pain of left knee 07/30/2023   Chronic pelvic pain in female 03/09/2023  Chronic bilateral low back pain without sciatica 03/09/2023   Primary osteoarthritis of left knee 09/08/2022   Vitamin D deficiency 09/08/2022   Hypothyroidism 11/29/2021   Body mass index (BMI) of 35.0-35.9 in adult 06/04/2020   History of migraine headaches 12/06/2019   Dyslipidemia 06/30/2019   GERD with esophagitis 06/30/2019   Chronic vertigo 04/13/2018   Prediabetes 10/17/2015   Spinal stenosis, lumbar region, with neurogenic claudication 10/24/2014    Past Medical History:  Diagnosis Date   Acute sinusitis    Allergy    Anxiety     Depression    NO MEDICATIONS - DOING OK NOW   Dizziness    Ear ache    RIGHT --HX OF MULTIPLE EAR INFECTIONS AS A CHILD    GERD (gastroesophageal reflux disease)    Headache(784.0)    MIGRAINES   Leg pain, left    with swelling   Lumbar stenosis    PAIN BACK AND LEFT LEG AND NUMBNESS LEG AND FOOT   Peripheral vascular disease (HCC)    HX OF TREATMENT FOR VARICOSE VEINS RIGHT LEG   Thyroid disease    TOLD BLOOD LEVELS SLIGHTLY ABNORMAL - BUT NOT REQUIRED MEDICATION    Past Surgical History:  Procedure Laterality Date   BACK SURGERY     bladder lift     AT SAME TIME OF HYSTERECTOMY   BRAVO PH STUDY N/A 07/26/2021   Procedure: BRAVO PH STUDY;  Surgeon: Jeani Hawking, MD;  Location: [facility];  Service: Endoscopy;  Laterality: N/A;   CHOLECYSTECTOMY     ESOPHAGOGASTRODUODENOSCOPY (EGD) WITH PROPOFOL N/A 07/26/2021   Procedure: ESOPHAGOGASTRODUODENOSCOPY (EGD) WITH PROPOFOL;  Surgeon: Jeani Hawking, MD;  Location: [facility];  Service: Endoscopy;  Laterality: N/A;   LUMBAR LAMINECTOMY/DECOMPRESSION MICRODISCECTOMY Left 10/24/2014   Procedure: COMPLETE LUMBAR DECOMPRESSION L4-L5 CENTRAL AND MICRODISCECTOMY L4-L5 LEFT;  Surgeon: Jacki Cones, MD;  Location: [facility];  Service: Orthopedics;  Laterality: Left;   POLYPECTOMY  07/26/2021   Procedure: POLYPECTOMY;  Surgeon: Jeani Hawking, MD;  Location: [facility];  Service: Endoscopy;;   VAGINAL HYSTERECTOMY      Social History   Socioeconomic History   Marital status: Married    Spouse name: Not on file   Number of children: Not on file   Years of education: Not on file   Highest education level: Not on file  Occupational History   Not on file  Tobacco Use   Smoking status: Never   Smokeless tobacco: Never  Vaping Use   Vaping status: Never Used  Substance and Sexual Activity   Alcohol use: No   Drug use: Never   Sexual activity: Yes  Other Topics Concern   Not on file  Social History Narrative   ** Merged  History Encounter **       Social Determinants of Health   Financial Resource Strain: Not on file  Food Insecurity: Not on file  Transportation Needs: Not on file  Physical Activity: Not on file  Stress: Not on file  Social Connections: Not on file  Intimate Partner Violence: Not on file    Family History  Problem Relation Age of Onset   Hypertension Mother    Arthritis Mother    Diabetes Mother    Hyperlipidemia Mother    Arthritis Sister    Rheum arthritis Brother    Autoimmune disease Brother    Lung disease Brother    Arthritis Sister    Gout Son    High Cholesterol Daughter  Thyroid disease Neg Hx      Review of Systems  Constitutional: Negative.   HENT: Negative.  Negative for congestion and sore throat.   Respiratory: Negative.  Negative for cough and shortness of breath.   Cardiovascular: Negative.  Negative for chest pain and palpitations.  Gastrointestinal: Negative.  Negative for abdominal pain, diarrhea, nausea and vomiting.  Genitourinary: Negative.   Musculoskeletal:  Negative for joint pain.  Skin: Negative.  Negative for rash.  Neurological:  Negative for dizziness and headaches.  All other systems reviewed and are negative.   Vitals:   09/15/23 1445  BP: 128/88  Pulse: 65  Temp: 98.2 F (36.8 C)  SpO2: 96%    Physical Exam Vitals reviewed.  Constitutional:      Appearance: Normal appearance.  HENT:     Head: Normocephalic.     Mouth/Throat:     Mouth: Mucous membranes are moist.     Pharynx: Oropharynx is clear.  Eyes:     Extraocular Movements: Extraocular movements intact.     Conjunctiva/sclera: Conjunctivae normal.     Pupils: Pupils are equal, round, and reactive to light.  Cardiovascular:     Rate and Rhythm: Normal rate and regular rhythm.     Pulses: Normal pulses.     Heart sounds: Normal heart sounds.  Pulmonary:     Effort: Pulmonary effort is normal.     Breath sounds: Normal breath sounds.  Musculoskeletal:         General: Normal range of motion.     Cervical back: No tenderness.  Lymphadenopathy:     Cervical: No cervical adenopathy.  Skin:    General: Skin is warm and dry.     Capillary Refill: Capillary refill takes less than 2 seconds.  Neurological:     General: No focal deficit present.     Mental Status: She is alert and oriented to person, place, and time.  Psychiatric:        Mood and Affect: Mood normal.        Behavior: Behavior normal.      ASSESSMENT & PLAN: A total of 32 minutes was spent with the patient and counseling/coordination of care regarding preparing for this visit, review of most recent office visit notes, review of most recent blood work results, diagnosis of Raynaud's phenomenon and differential diagnosis, need for rheumatology evaluation, prognosis, documentation and need for follow-up.  Problem List Items Addressed This Visit       Cardiovascular and Mediastinum   Raynaud's syndrome with gangrene [facility]) - Primary    Patient brought in pictures showing very dusky and swollen fingers compatible with Raynaud's phenomenon Recommend rheumatology evaluation.  Referral placed today. Unknown trigger.      Relevant Orders   Ambulatory referral to Rheumatology   Patient Instructions  Fenmeno de Raynaud Raynaud's Phenomenon  El fenmeno de Raynaud es una enfermedad que afecta los vasos sanguneos (arterias) que transportan la sangre a los dedos de las manos y de los pies. Las arterias que [address] a las [place], los labios, los pezones o la punta de la nariz tambin pueden verse afectadas. El fenmeno de Raynaud causa el estrechamiento temporal de las arterias (espasmo). Como consecuencia, la irrigacin de sangre a las zonas afectadas disminuye transitoriamente. Esto suele ocurrir en respuesta a las bajas temperaturas o el estrs. Durante una crisis, la piel de las zonas afectadas se pone blanca, luego azul y finalmente roja. Adems, la persona puede sentir  hormigueo o adormecimiento en esas zonas.  Generalmente, las crisis duran [place], y [employer] la irrigacin de sangre a la zona se Air traffic controller. En la [employer], el fenmeno de Raynaud no causa problemas graves de [place]. Cules son las causas? En muchos casos, se desconoce la causa de esta afeccin. La afeccin puede manifestarse por s sola (fenmeno de Raynaud primario) o puede estar asociada a otras enfermedades u otros factores (fenmeno de Raynaud secundario). Entre las causas posibles pueden incluirse: Enfermedades o afecciones que daan las arterias. Lesiones o acciones repetitivas que [employer] o los pies. La exposicin a ciertas sustancias qumicas. Tomar medicamentos que producen el estrechamiento de las arterias. Otros trastornos mdicos, tales como lupus, esclerodermia, artritis reumatoide, problemas de tiroides, trastornos de [employer], sndrome de [place] o Gaffer. Qu incrementa el riesgo? Los siguientes factores pueden hacer que sea ms propenso a desarrollar esta afeccin: Tener entre 20 y 68 aos. Ser mujer. Tener antecedentes familiares del fenmeno de Raynaud. Vivir en un lugar con bajas temperaturas. Fumar. Cules son los signos o sntomas? Los sntomas de fenmeno de Raynaud normalmente se manifiestan cuando una persona se expone a bajas temperaturas o sufre estrs emocional. Pueden durar unos minutos o varias horas. Por lo general, esta enfermedad afecta los dedos de las [address], pero tambin puede [employer] dedos de los pies, los pezones, los labios, las orejas o la punta de la nariz. Entre los sntomas, se pueden incluir los siguientes: Cambios en el color de la piel. La piel de las zonas afectadas se tornar plida o blanca. Luego, puede pasar de blanca a [place] y de [place] a roja, a medida que se restablece la irrigacin normal de sangre a la zona. Adormecimiento, hormigueo o dolor en las zonas afectadas. En los casos graves, los sntomas  pueden incluir lo siguiente: Probation officer piel. Deterioro y [employer] tejidos (gangrena). Cmo se diagnostica? Esta afeccin se puede diagnosticar en funcin de lo siguiente: Los sntomas y los antecedentes mdicos. Un examen fsico. Durante el examen, pueden indicarle que ponga las manos en agua fra para determinar si hay una reaccin a la baja temperatura. Estudios, como, por ejemplo: Anlisis de sangre para detectar si hay otras enfermedades o afecciones. Un estudio para Nurse, children's de la sangre a travs de las arterias y las venas (ecografa vascular). Un estudio mediante el cual la piel de la base de las uas de las manos se examina con un microscopio (capilaroscopia del lecho ungueal). Cmo se trata? Durante un episodio, puede tomar medidas para ayudar a que los sntomas desaparezcan ms rpido. Las opciones [employer] brazos como la aspas de un molino de viento, calentarse los dedos debajo de agua caliente o poner los dedos en un pliegue clido del cuerpo, como la [place]. El tratamiento a largo plazo para esta afeccin suele implicar hacer cambios en el estilo de vida y tomar medidas para controlar la exposicin a las Barrister's clerk. En los casos ms graves, pueden usarse medicamentos (antagonistas del calcio) para Dealer sangunea. Siga estas instrucciones en su casa: Evite las bajas temperaturas Para evitar la exposicin al fro, siga estos pasos: Si es posible, qudese adentro cuando haga fro. Cuando salga durante la poca de bajas temperaturas, vstase con varias capas de ropa y use mitones, un gorro, una bufanda y zapatos abrigados. Use mitones o guantes cuando manipule hielo o comida congelada. Use portavasos o portalatas para los vasos o las latas que contengan bebidas fras. Deje correr el agua caliente durante un  rato antes de tomar una ducha o un bao. Caliente el automvil antes de [employer]. Estilo de vida Si es  posible, evite las situaciones emotivas y Geographical information systems officer. Trate de encontrar herramientas para controlar el estrs, por ejemplo: Actividad fsica. Yoga. Meditacin. Biorretroalimentacin. No consuma ningn producto que contenga nicotina o tabaco. Estos productos incluyen cigarrillos, tabaco para Theatre manager y aparatos de vapeo, como los Administrator, Civil Service. Si necesita ayuda para dejar de consumir estos productos, consulte al [employer]. Evite ser fumador pasivo. Limite el consumo de cafena. En cambio, tome caf, t y gaseosas descafeinadas. Evite el chocolate. No use herramientas ni maquinaria que vibren. Indicaciones generales Protjase las manos y los pies para no sufrir lesiones, cortes o moretones. Evite el uso de anillos o pulseras muy ajustados. Use medias flojas y zapatos cmodos y amplios. Use los medicamentos de venta libre y los recetados solamente como se lo haya indicado el mdico. Dnde buscar apoyo Raynaud's Association (Asociacin de Raynaud): www.raynauds.org Dnde buscar ms informacin [employer] of Arthritis and Musculoskeletal and Skin Diseases (Instituto [employer] de Artritis y [place] Musculoesquelticas y Arboriculturist): www.niams.http://www.myers.net/ Comunquese con un mdico si: Las [employer] a pesar de los cambios en el estilo de vida. Le aparecen llagas en los dedos de las manos o de los pies que no se cicatrizan. Tiene estras en la piel de los dedos de las manos o de los pies. Tiene fiebre. Tiene dolor o hinchazn en las articulaciones. Tiene una erupcin cutnea. Los sntomas aparecen en un solo lado del cuerpo. Solicite ayuda de inmediato si: Los dedos de las manos o de los pies se tornan negros. Tiene dolor intenso en las zonas afectadas. Estos sntomas pueden representar un problema grave que constituye Radio broadcast assistant. No espere a ver si los sntomas desaparecen. Solicite atencin mdica de inmediato. Comunquese con el servicio de emergencias de su  localidad (911 en los [place]). No conduzca por sus propios medios [employer]. Resumen El fenmeno de Raynaud es una enfermedad que afecta las arterias que transportan la sangre a los dedos de las manos y de los pies, las [place], los labios, los pezones o la punta de la nariz. En muchos casos, se desconoce la causa de esta afeccin. Los sntomas de esta afeccin incluyen cambios en el color de la piel junto con entumecimiento y hormigueo de la zona afectada. El tratamiento de esta enfermedad incluye cambios en el estilo de vida y reduccin de la exposicin a las bajas temperaturas. Pueden usarse medicamentos para los casos graves de Astronomer. Comunquese con su mdico si la afeccin empeora aunque reciba tratamiento. Esta informacin no tiene Theme park manager el consejo del mdico. Asegrese de hacerle al mdico cualquier pregunta que tenga. Document Revised: 01/07/2021 Document Reviewed: 01/07/2021 Elsevier Patient Education  2024 Elsevier Inc.      Edwina Barth, MD  Primary Care at [facility]

## 2023-09-15 NOTE — Assessment & Plan Note (Signed)
Patient brought in pictures showing very dusky and swollen fingers compatible with Raynaud's phenomenon Recommend rheumatology evaluation.  Referral placed today. Unknown trigger.

## 2023-09-15 NOTE — Patient Instructions (Signed)
Fenmeno de Raynaud Raynaud's Phenomenon  El fenmeno de Raynaud es una enfermedad que afecta los vasos sanguneos (arterias) que transportan la sangre a los dedos de las manos y de los pies. Las arterias que [address] a las [place], los labios, los pezones o la punta de la nariz tambin pueden verse afectadas. El fenmeno de Raynaud causa el estrechamiento temporal de las arterias (espasmo). Como consecuencia, la irrigacin de sangre a las zonas afectadas disminuye transitoriamente. Esto suele ocurrir en respuesta a las bajas temperaturas o el estrs. Durante una crisis, la piel de las zonas afectadas se pone blanca, luego azul y finalmente roja. Adems, la persona puede sentir hormigueo o adormecimiento en esas zonas. Generalmente, las crisis duran [place], y [employer] la irrigacin de sangre a la zona se Air traffic controller. En la [employer], el fenmeno de Raynaud no causa problemas graves de [place]. Cules son las causas? En muchos casos, se desconoce la causa de esta afeccin. La afeccin puede manifestarse por s sola (fenmeno de Raynaud primario) o puede estar asociada a otras enfermedades u otros factores (fenmeno de Raynaud secundario). Entre las causas posibles pueden incluirse: Enfermedades o afecciones que daan las arterias. Lesiones o acciones repetitivas que [employer] o los pies. La exposicin a ciertas sustancias qumicas. Tomar medicamentos que producen el estrechamiento de las arterias. Otros trastornos mdicos, tales como lupus, esclerodermia, artritis reumatoide, problemas de tiroides, trastornos de [employer], sndrome de [place] o Gaffer. Qu incrementa el riesgo? Los siguientes factores pueden hacer que sea ms propenso a desarrollar esta afeccin: Tener entre 20 y 41 aos. Ser mujer. Tener antecedentes familiares del fenmeno de Raynaud. Vivir en un lugar con bajas temperaturas. Fumar. Cules son los signos o sntomas? Los sntomas de fenmeno de  Raynaud normalmente se manifiestan cuando una persona se expone a bajas temperaturas o sufre estrs emocional. Pueden durar unos minutos o varias horas. Por lo general, esta enfermedad afecta los dedos de las [address], pero tambin puede [employer] dedos de los pies, los pezones, los labios, las orejas o la punta de la nariz. Entre los sntomas, se pueden incluir los siguientes: Cambios en el color de la piel. La piel de las zonas afectadas se tornar plida o blanca. Luego, puede pasar de blanca a [place] y de [place] a roja, a medida que se restablece la irrigacin normal de sangre a la zona. Adormecimiento, hormigueo o dolor en las zonas afectadas. En los casos graves, los sntomas pueden incluir lo siguiente: Probation officer piel. Deterioro y [employer] tejidos (gangrena). Cmo se diagnostica? Esta afeccin se puede diagnosticar en funcin de lo siguiente: Los sntomas y los antecedentes mdicos. Un examen fsico. Durante el examen, pueden indicarle que ponga las manos en agua fra para determinar si hay una reaccin a la baja temperatura. Estudios, como, por ejemplo: Anlisis de sangre para detectar si hay otras enfermedades o afecciones. Un estudio para Nurse, children's de la sangre a travs de las arterias y las venas (ecografa vascular). Un estudio mediante el cual la piel de la base de las uas de las manos se examina con un microscopio (capilaroscopia del lecho ungueal). Cmo se trata? Durante un episodio, puede tomar medidas para ayudar a que los sntomas desaparezcan ms rpido. Las opciones [employer] brazos como la aspas de un molino de viento, calentarse los dedos debajo de agua caliente o poner los dedos en un pliegue clido del cuerpo, como la [place]. El tratamiento a largo plazo para Acupuncturist  afeccin suele implicar hacer cambios en el estilo de vida y tomar medidas para controlar la exposicin a las Barrister's clerk. En los casos ms graves, pueden usarse medicamentos  (antagonistas del calcio) para Dealer sangunea. Siga estas instrucciones en su casa: Evite las bajas temperaturas Para evitar la exposicin al fro, siga estos pasos: Si es posible, qudese adentro cuando haga fro. Cuando salga durante la poca de bajas temperaturas, vstase con varias capas de ropa y use mitones, un gorro, una bufanda y zapatos abrigados. Use mitones o guantes cuando manipule hielo o comida congelada. Use portavasos o portalatas para los vasos o las latas que contengan bebidas fras. Deje correr el agua caliente durante un rato antes de tomar una ducha o un bao. Caliente el automvil antes de [employer]. Estilo de vida Si es posible, evite las situaciones emotivas y Geographical information systems officer. Trate de encontrar herramientas para controlar el estrs, por ejemplo: Actividad fsica. Yoga. Meditacin. Biorretroalimentacin. No consuma ningn producto que contenga nicotina o tabaco. Estos productos incluyen cigarrillos, tabaco para Theatre manager y aparatos de vapeo, como los Administrator, Civil Service. Si necesita ayuda para dejar de consumir estos productos, consulte al [employer]. Evite ser fumador pasivo. Limite el consumo de cafena. En cambio, tome caf, t y gaseosas descafeinadas. Evite el chocolate. No use herramientas ni maquinaria que vibren. Indicaciones generales Protjase las manos y los pies para no sufrir lesiones, cortes o moretones. Evite el uso de anillos o pulseras muy ajustados. Use medias flojas y zapatos cmodos y amplios. Use los medicamentos de venta libre y los recetados solamente como se lo haya indicado el mdico. Dnde buscar apoyo Raynaud's Association (Asociacin de Raynaud): www.raynauds.org Dnde buscar ms informacin [employer] of Arthritis and Musculoskeletal and Skin Diseases (Instituto [employer] de Artritis y [place] Musculoesquelticas y Arboriculturist): www.niams.http://www.myers.net/ Comunquese con un mdico si: Las [employer] a pesar de los cambios en el estilo de vida. Le aparecen llagas en los dedos de las manos o de los pies que no se cicatrizan. Tiene estras en la piel de los dedos de las manos o de los pies. Tiene fiebre. Tiene dolor o hinchazn en las articulaciones. Tiene una erupcin cutnea. Los sntomas aparecen en un solo lado del cuerpo. Solicite ayuda de inmediato si: Los dedos de las manos o de los pies se tornan negros. Tiene dolor intenso en las zonas afectadas. Estos sntomas pueden representar un problema grave que constituye Radio broadcast assistant. No espere a ver si los sntomas desaparecen. Solicite atencin mdica de inmediato. Comunquese con el servicio de emergencias de su localidad (911 en los [place]). No conduzca por sus propios medios [employer]. Resumen El fenmeno de Raynaud es una enfermedad que afecta las arterias que transportan la sangre a los dedos de las manos y de los pies, las [place], los labios, los pezones o la punta de la nariz. En muchos casos, se desconoce la causa de esta afeccin. Los sntomas de esta afeccin incluyen cambios en el color de la piel junto con entumecimiento y hormigueo de la zona afectada. El tratamiento de esta enfermedad incluye cambios en el estilo de vida y reduccin de la exposicin a las bajas temperaturas. Pueden usarse medicamentos para los casos graves de Astronomer. Comunquese con su mdico si la afeccin empeora aunque reciba tratamiento. Esta informacin no tiene Theme park manager el consejo del mdico. Asegrese de hacerle al mdico cualquier pregunta que tenga. Document Revised: 01/07/2021 Document Reviewed: 01/07/2021 Elsevier Patient Education  2024 [employer].

## 2023-09-17 ENCOUNTER — Encounter: Payer: Self-pay | Admitting: Emergency Medicine

## 2023-09-17 ENCOUNTER — Ambulatory Visit: Payer: Managed Care, Other (non HMO) | Admitting: Emergency Medicine

## 2023-09-18 ENCOUNTER — Ambulatory Visit (INDEPENDENT_AMBULATORY_CARE_PROVIDER_SITE_OTHER): Payer: Managed Care, Other (non HMO) | Admitting: Nurse Practitioner

## 2023-09-18 ENCOUNTER — Ambulatory Visit: Payer: Managed Care, Other (non HMO) | Admitting: Endocrinology

## 2023-09-18 ENCOUNTER — Encounter: Payer: Self-pay | Admitting: Endocrinology

## 2023-09-18 VITALS — BP 108/72 | HR 72 | Temp 98.1°F | Ht 61.0 in | Wt 191.0 lb

## 2023-09-18 VITALS — BP 118/70 | HR 68 | Resp 20 | Ht 61.0 in | Wt 190.0 lb

## 2023-09-18 DIAGNOSIS — E039 Hypothyroidism, unspecified: Secondary | ICD-10-CM

## 2023-09-18 DIAGNOSIS — E559 Vitamin D deficiency, unspecified: Secondary | ICD-10-CM

## 2023-09-18 DIAGNOSIS — R300 Dysuria: Secondary | ICD-10-CM | POA: Diagnosis not present

## 2023-09-18 LAB — POCT URINALYSIS DIPSTICK
Bilirubin, UA: NEGATIVE
Blood, UA: NEGATIVE
Glucose, UA: NEGATIVE
Ketones, UA: NEGATIVE
Leukocytes, UA: NEGATIVE
Nitrite, UA: NEGATIVE
Protein, UA: POSITIVE — AB
Spec Grav, UA: >=1.03 — AB (ref 1.010–1.025)
Urobilinogen, UA: 0.2 U/dL
pH, UA: 6 (ref 5.0–8.0)

## 2023-09-18 LAB — VITAMIN D 25 HYDROXY (VIT D DEFICIENCY, FRACTURES): VITD: 28.14 ng/mL — ABNORMAL LOW (ref 30.00–100.00)

## 2023-09-18 LAB — T4, FREE: Free T4: 1.11 ng/dL (ref 0.60–1.60)

## 2023-09-18 LAB — TSH: TSH: 1.24 u[IU]/mL (ref 0.35–5.50)

## 2023-09-18 NOTE — Progress Notes (Unsigned)
Outpatient Endocrinology Note [name], MD  09/20/23  Patient's Name: Kendra Rodriguez    DOB: [birth year]    MRN: [id number]  REASON OF VISIT: Follow-up for hypothyroidism  PCP: [name], MD  HISTORY OF PRESENT ILLNESS:   Kendra Rodriguez is a 57 y.o. old female with past medical history as listed below is presented for a follow up of hypothyroidism.  Patient was previously seen by Dr. Everardo All and Dr. Roosevelt Locks.   Pertinent Thyroid History: Patient was diagnosed with hypothyroidism in May 2022, she was initially presented with subacute thyroiditis with transient hypothyroidism, develop hypothyroidism spontaneously and Synthroid/levothyroxine was started.  Patient has prediabetes , used to be on metformin since 2019, reports diarrhea and currently has not been taking.  Managed by primary care provider.   Interval history  Patient has been taking levothyroxine 50 mcg daily, takes in the morning before breakfast and reports compliance.  She also reports taking pantoprazole before breakfast for GERD.  She does not take any vitamins/calcium or iron supplement.  She complains of fatigue otherwise no constipation, heat or cold intolerance, no palpitation.  Patient is accompanied by daughter in the clinic today.  REVIEW OF SYSTEMS:  As per history of present illness.   PAST MEDICAL HISTORY: Past Medical History:  Diagnosis Date   Acute sinusitis    Allergy    Anxiety    Depression    NO MEDICATIONS - DOING OK NOW   Dizziness    Ear ache    RIGHT --HX OF MULTIPLE EAR INFECTIONS AS A CHILD    GERD (gastroesophageal reflux disease)    Headache(784.0)    MIGRAINES   Leg pain, left    with swelling   Lumbar stenosis    PAIN BACK AND LEFT LEG AND NUMBNESS LEG AND FOOT   Peripheral vascular disease (HCC)    HX OF TREATMENT FOR VARICOSE VEINS RIGHT LEG   Thyroid disease    TOLD BLOOD LEVELS SLIGHTLY ABNORMAL - BUT NOT REQUIRED MEDICATION    PAST SURGICAL HISTORY: Past  Surgical History:  Procedure Laterality Date   BACK SURGERY     bladder lift     AT SAME TIME OF HYSTERECTOMY   BRAVO PH STUDY N/A 07/26/2021   Procedure: BRAVO PH STUDY;  Surgeon: Jeani Hawking, MD;  Location: [facility];  Service: Endoscopy;  Laterality: N/A;   CHOLECYSTECTOMY     ESOPHAGOGASTRODUODENOSCOPY (EGD) WITH PROPOFOL N/A 07/26/2021   Procedure: ESOPHAGOGASTRODUODENOSCOPY (EGD) WITH PROPOFOL;  Surgeon: Jeani Hawking, MD;  Location: [facility];  Service: Endoscopy;  Laterality: N/A;   LUMBAR LAMINECTOMY/DECOMPRESSION MICRODISCECTOMY Left 10/24/2014   Procedure: COMPLETE LUMBAR DECOMPRESSION L4-L5 CENTRAL AND MICRODISCECTOMY L4-L5 LEFT;  Surgeon: Jacki Cones, MD;  Location: [facility];  Service: Orthopedics;  Laterality: Left;   POLYPECTOMY  07/26/2021   Procedure: POLYPECTOMY;  Surgeon: Jeani Hawking, MD;  Location: [facility];  Service: Endoscopy;;   VAGINAL HYSTERECTOMY      ALLERGIES: No Known Allergies  FAMILY HISTORY:  Family History  Problem Relation Age of Onset   Hypertension Mother    Arthritis Mother    Diabetes Mother    Hyperlipidemia Mother    Arthritis Sister    Rheum arthritis Brother    Autoimmune disease Brother    Lung disease Brother    Arthritis Sister    Gout Son    High Cholesterol Daughter    Thyroid disease Neg Hx     SOCIAL HISTORY: Social History   Socioeconomic History  Marital status: Married    Spouse name: Not on file   Number of children: Not on file   Years of education: Not on file   Highest education level: 6th grade  Occupational History   Not on file  Tobacco Use   Smoking status: Never   Smokeless tobacco: Never  Vaping Use   Vaping status: Never Used  Substance and Sexual Activity   Alcohol use: No   Drug use: Never   Sexual activity: Yes  Other Topics Concern   Not on file  Social History Narrative   ** Merged History Encounter **       Social Determinants of Health   Financial Resource Strain:  Low Risk  (09/18/2023)   Overall Financial Resource Strain (CARDIA)    Difficulty of Paying Living Expenses: Not very hard  Food Insecurity: No Food Insecurity (09/18/2023)   Hunger Vital Sign    Worried About Running Out of Food in the Last Year: Never true    Ran Out of Food in the Last Year: Never true  Transportation Needs: No Transportation Needs (09/18/2023)   PRAPARE - Administrator, Civil Service (Medical): No    Lack of Transportation (Non-Medical): No  Physical Activity: Insufficiently Active (09/18/2023)   Exercise Vital Sign    Days of Exercise per Week: 3 days    Minutes of Exercise per Session: 20 min  Stress: No Stress Concern Present (09/18/2023)   [employer] of Occupational Health - Occupational Stress Questionnaire    Feeling of Stress : Only a little  Social Connections: Unknown (09/18/2023)   Social Connection and Isolation Panel [NHANES]    Frequency of Communication with Friends and Family: More than three times a week    Frequency of Social Gatherings with Friends and Family: More than three times a week    Attends Religious Services: More than 4 times per year    Active Member of [employer] or Organizations: No    Attends Engineer, structural: Not on file    Marital Status: Not on file    MEDICATIONS:  Current Outpatient Medications  Medication Sig Dispense Refill   ALPRAZolam (XANAX) 0.5 MG tablet Take 1 tablet (0.5 mg total) by mouth daily as needed for anxiety. 20 tablet 1   atorvastatin (LIPITOR) 40 MG tablet TAKE 1 TABLET(40 MG) BY MOUTH DAILY 90 tablet 3   azithromycin (ZITHROMAX) 250 MG tablet Sig as indicated 6 tablet 0   benzonatate (TESSALON) 200 MG capsule Take 1 capsule (200 mg total) by mouth 3 (three) times daily as needed. 20 capsule 0   cetirizine (ZYRTEC ALLERGY) 10 MG tablet Take 1 tablet (10 mg total) by mouth daily. 30 tablet 0   cyclobenzaprine (FLEXERIL) 10 MG tablet Take 1 tablet (10 mg total) by mouth 2 (two)  times daily as needed for muscle spasms. 20 tablet 0   dextromethorphan-guaiFENesin (MUCINEX DM) 30-600 MG 12hr tablet Take 1 tablet by mouth 2 (two) times daily. 12 tablet 1   diazepam (VALIUM) 5 MG tablet Take 1 tablet (5 mg total) by mouth once as needed for up to 1 dose (prior to MRI for anxiety). 1 tablet 0   famotidine (PEPCID) 20 MG tablet Take 1 tablet (20 mg total) by mouth 2 (two) times daily. 30 tablet 0   fluticasone (FLONASE) 50 MCG/ACT nasal spray SHAKE LIQUID AND USE 1 SPRAY IN EACH NOSTRIL DAILY 16 g 2   ipratropium (ATROVENT) 0.03 % nasal spray Place 2 sprays  into both nostrils every 12 (twelve) hours. 30 mL 0   levothyroxine (SYNTHROID) 50 MCG tablet TAKE 1 TABLET(50 MCG) BY MOUTH DAILY 90 tablet 1   metFORMIN (GLUCOPHAGE-XR) 500 MG 24 hr tablet Take 1 tablet (500 mg total) by mouth 2 (two) times daily with a meal. 120 tablet 3   Olopatadine HCl 0.2 % SOLN Apply 1 drop to eye daily as needed. 2.5 mL 3   pantoprazole (PROTONIX) 40 MG tablet Take 40 mg by mouth daily before breakfast.     predniSONE (DELTASONE) 50 MG tablet Take 1 tab daily with breakfast for 3 days 3 tablet 0   trimethoprim-polymyxin b (POLYTRIM) ophthalmic solution Place 1 drop into the left eye every 6 (six) hours. 10 mL 0   pregabalin (LYRICA) 75 MG capsule Take 1 capsule (75 mg total) by mouth 2 (two) times daily. 84 capsule 0   No current facility-administered medications for this visit.    PHYSICAL EXAM: Vitals:   09/18/23 0847  BP: 118/70  Pulse: 68  Resp: 20  SpO2: 98%  Weight: 190 lb (86.2 kg)  Height: 5\' 1"  (1.549 m)   Body mass index is 35.9 kg/m.  Wt Readings from Last 3 Encounters:  09/18/23 191 lb (86.6 kg)  09/18/23 190 lb (86.2 kg)  09/15/23 191 lb 4 oz (86.8 kg)    General: Well developed, well nourished female in no apparent distress.  HEENT: AT/, no external lesions. Hearing intact to the spoken word Eyes: Conjunctiva clear and no icterus. Neck: Trachea midline, neck supple  without appreciable thyromegaly or lymphadenopathy and no palpable thyroid nodules Lungs: Clear to auscultation, no wheeze. Respirations not labored Heart: S1S2, Regular in rate and rhythm.  Abdomen: Soft, non tender, non distended, no masses, no striae Neurologic: Alert, oriented, normal speech, deep tendon biceps reflexes normal,  no gross focal neurological deficit Extremities: No pedal pitting edema, no tremors of outstretched hands Skin: Warm, color good.  Psychiatric: Does not appear depressed or anxious  PERTINENT HISTORIC LABORATORY AND IMAGING STUDIES:  All pertinent laboratory results were reviewed. Please see HPI also for further details.   TSH  Date Value Ref Range Status  09/18/2023 1.24 0.35 - 5.50 uIU/mL Final  03/09/2023 1.18 0.35 - 5.50 uIU/mL Final  09/08/2022 1.35 0.35 - 5.50 uIU/mL Final     ASSESSMENT / PLAN  1. Primary hypothyroidism   2. Vitamin D deficiency    -Patient was diagnosed with hypothyroidism in 2022.  Currently taking levothyroxine 50 mcg daily.  She complains of fatigue otherwise clinically euthyroid today. -She has history of vitamin D deficiency, currently not taking vitamin D supplement.   Plan: -Check thyroid function test and adjust the dose of levothyroxine as needed. -Check vitamin D level. -Asked patient to take pantoprazole with lunch or evening meal, not in the morning with levothyroxine.   We discussed the medical need for compliance with levothyroxine therapy, that it is a hormone necessary for life, and that serious consequences may result from noncompliance. Discussed the proper method of levothyroxine administration: take on an empty stomach in the morning, with water, waiting thirty to sixty minutes before taking any other beverages or food. Also reviewed the need to take calcium or iron supplements or multivitamin (that may contain iron or calcium) at least 4 hours after levothyroxine administration.  Labs reviewed normal  thyroid function test, continue current dose of levothyroxine 50 mcg daily.  Vitamin D is mildly low, take vitamin D3 1000 international unit daily over-the-counter.  Latest Reference Range & Units 09/18/23 09:26  VITD 30.00 - 100.00 ng/mL 28.14 (L)  TSH 0.35 - 5.50 uIU/mL 1.24  T4,Free(Direct) 0.60 - 1.60 ng/dL 5.36  (L): Data is abnormally low   Diagnoses and all orders for this visit:  Primary hypothyroidism -     T4, free; Future -     TSH; Future -     T4, free -     TSH  Vitamin D deficiency -     VITAMIN D 25 Hydroxy (Vit-D Deficiency, Fractures); Future -     VITAMIN D 25 Hydroxy (Vit-D Deficiency, Fractures)    DISPOSITION Follow up in clinic in 6 months suggested.  All questions answered and patient verbalized understanding of the plan.  [place] Lonzie Simmer, MD [facility] Endocrinology [facility] [address] [place], [address] [place], [place] [address] Phone # [phone]  At least part of this note was generated using voice recognition software. Inadvertent word errors may have occurred, which were not recognized during the proofreading process.

## 2023-09-18 NOTE — Progress Notes (Signed)
Established Patient Office Visit  Subjective   Patient ID: Kendra Rodriguez, female    DOB: [birth year]  Age: 57 y.o. MRN: [id number]  Chief Complaint  Patient presents with   Urinary Tract Infection    Burning sensation and pain when urinating      Patient requested that daughter interpret for her. Her daughter was available by phone and provided interpretation services for duration of visit.   Symptom onset 2 days ago.  Taking Azo and cranberry supplement.  Reports dysuria mostly at night.  Some frequency and urgency as well.  No fever or chills, no diarrhea, nausea, or vomiting.  No hematuria.    Review of Systems  Constitutional:  Negative for chills and fever.  Gastrointestinal:  Negative for diarrhea, nausea and vomiting.  Genitourinary:  Positive for dysuria, frequency and urgency. Negative for hematuria.      Objective:     BP 108/72   Pulse 72   Temp 98.1 F (36.7 C) (Temporal)   Ht 5\' 1"  (1.549 m)   Wt 191 lb (86.6 kg)   SpO2 98%   BMI 36.09 kg/m    Physical Exam Vitals reviewed.  Constitutional:      General: She is not in acute distress.    Appearance: Normal appearance.  HENT:     Head: Normocephalic and atraumatic.  Cardiovascular:     Rate and Rhythm: Normal rate and regular rhythm.     Pulses: Normal pulses.     Heart sounds: Normal heart sounds.  Pulmonary:     Effort: Pulmonary effort is normal.     Breath sounds: Normal breath sounds.  Abdominal:     Tenderness: There is no right CVA tenderness or left CVA tenderness.  Skin:    General: Skin is warm and dry.  Neurological:     General: No focal deficit present.     Mental Status: She is alert and oriented to person, place, and time.  Psychiatric:        Mood and Affect: Mood normal.        Behavior: Behavior normal.        Judgment: Judgment normal.      Results for orders placed or performed in visit on 09/18/23  POCT urinalysis dipstick  Result Value Ref Range   Color, UA      Clarity, UA     Glucose, UA Negative Negative   Bilirubin, UA negative    Ketones, UA negative    Spec Grav, UA >=1.030 (A) 1.010 - 1.025   Blood, UA negative    pH, UA 6.0 5.0 - 8.0   Protein, UA Positive (A) Negative   Urobilinogen, UA 0.2 0.2 or 1.0 E.U./dL   Nitrite, UA negative    Leukocytes, UA Negative Negative   Appearance     Odor    Results for orders placed or performed in visit on 09/18/23  T4, free  Result Value Ref Range   Free T4 1.11 0.60 - 1.60 ng/dL  TSH  Result Value Ref Range   TSH 1.24 0.35 - 5.50 uIU/mL  VITAMIN D 25 Hydroxy (Vit-D Deficiency, Fractures)  Result Value Ref Range   VITD 28.14 (L) 30.00 - 100.00 ng/mL      The 10-year ASCVD risk score (Arnett DK, et al., 2019) is: 1.6%    Assessment & Plan:   Problem List Items Addressed This Visit       Other   Dysuria - Primary    Acute Point-of-care  urinalysis negative for evidence of UTI Send urine off for culture Further recommendations may be made based upon those results Patient to continue using Azo and cranberry supplementation for symptom management      Relevant Orders   Urine Culture   POCT urinalysis dipstick (Completed)    Return if symptoms worsen or fail to improve.    Elenore Paddy, NP

## 2023-09-18 NOTE — Assessment & Plan Note (Signed)
Acute Point-of-care urinalysis negative for evidence of UTI Send urine off for culture Further recommendations may be made based upon those results Patient to continue using Azo and cranberry supplementation for symptom management

## 2023-09-19 LAB — URINE CULTURE: Result:: NO GROWTH

## 2023-09-20 MED ORDER — LEVOTHYROXINE SODIUM 50 MCG PO TABS
ORAL_TABLET | ORAL | 3 refills | Status: DC
Start: 2023-09-20 — End: 2023-10-06

## 2023-09-21 ENCOUNTER — Ambulatory Visit: Payer: Commercial Managed Care - HMO | Admitting: "Endocrinology

## 2023-09-24 ENCOUNTER — Other Ambulatory Visit: Payer: Self-pay

## 2023-09-24 ENCOUNTER — Ambulatory Visit: Payer: Managed Care, Other (non HMO) | Admitting: Physical Medicine and Rehabilitation

## 2023-09-24 DIAGNOSIS — M5416 Radiculopathy, lumbar region: Secondary | ICD-10-CM

## 2023-09-24 MED ORDER — METHYLPREDNISOLONE ACETATE 40 MG/ML IJ SUSP
40.0000 mg | Freq: Once | INTRAMUSCULAR | Status: AC
Start: 2023-09-24 — End: 2023-09-24
  Administered 2023-09-24: 40 mg

## 2023-09-24 NOTE — Patient Instructions (Signed)

## 2023-09-24 NOTE — Progress Notes (Signed)
Kendra Rodriguez - 57 y.o. female MRN [id number]  Date of birth: [birth year]  Office Visit Note: Visit Date: 09/24/2023 PCP: [name], MD Referred by: London Sheer, MD  Subjective: Chief Complaint  Patient presents with   Lower Back - Pain   HPI:  Kendra Rodriguez is a 57 y.o. female who comes in today at the request of Dr. Willia Craze for planned Right L4-5 Lumbar Transforaminal epidural steroid injection with fluoroscopic guidance.  The patient has failed conservative care including home exercise, medications, time and activity modification.  This injection will be diagnostic and hopefully therapeutic.  Please see requesting physician notes for further details and justification.  She comes in today with predominantly right radicular pain much more than left and she is very anxious about having an injection performed.  We went over this at length with her today using interpreter and her daughter.  We decided to complete a right sided injection since it would just be 1 injection and see how she does.  I think she will do fine with injections if she continues to have left-sided complaints severe enough obviously we can get her back in for that if we need to.   ROS Otherwise per HPI.  Assessment & Plan: Visit Diagnoses:    ICD-10-CM   1. Lumbar radiculopathy  M54.16 XR C-ARM NO REPORT    Epidural Steroid injection    methylPREDNISolone acetate (DEPO-MEDROL) injection 40 mg      Plan: No additional findings.   Meds & Orders:  Meds ordered this encounter  Medications   methylPREDNISolone acetate (DEPO-MEDROL) injection 40 mg    Orders Placed This Encounter  Procedures   XR C-ARM NO REPORT   Epidural Steroid injection    Follow-up: Return for visit to requesting provider as needed.   Procedures: No procedures performed  Lumbosacral Transforaminal Epidural Steroid Injection - Sub-Pedicular Approach with Fluoroscopic Guidance  Patient: Kendra Rodriguez      Date of  Birth: [birth year] MRN: [id number] PCP: [name], MD      Visit Date: 09/24/2023   Universal Protocol:    Date/Time: 09/24/2023  Consent Given By: the patient  Position: PRONE  Additional Comments: Vital signs were monitored before and after the procedure. Patient was prepped and draped in the usual sterile fashion. The correct patient, procedure, and site was verified.   Injection Procedure Details:   Procedure diagnoses: Lumbar radiculopathy [M54.16]    Meds Administered:  Meds ordered this encounter  Medications   methylPREDNISolone acetate (DEPO-MEDROL) injection 40 mg    Laterality: Right  Location/Site: L4  Needle:5.0 in., 22 ga.  Short bevel or Quincke spinal needle  Needle Placement: Transforaminal  Findings:    -Comments: Excellent flow of contrast along the nerve, nerve root and into the epidural space.  Procedure Details: After squaring off the end-plates to get a true AP view, the C-arm was positioned so that an oblique view of the foramen as noted above was visualized. The target area is just inferior to the "nose of the scotty dog" or sub pedicular. The soft tissues overlying this structure were infiltrated with 2-3 ml. of 1% Lidocaine without Epinephrine.  The spinal needle was inserted toward the target using a "trajectory" view along the fluoroscope beam.  Under AP and lateral visualization, the needle was advanced so it did not puncture dura and was located close the 6 O'Clock position of the pedical in AP tracterory. Biplanar projections were used to confirm position.  Aspiration was confirmed to be negative for CSF and/or blood. A 1-2 ml. volume of Isovue-250 was injected and flow of contrast was noted at each level. Radiographs were obtained for documentation purposes.   After attaining the desired flow of contrast documented above, a 0.5 to 1.0 ml test dose of 0.25% Marcaine was injected into each respective transforaminal space.  The  patient was observed for 90 seconds post injection.  After no sensory deficits were reported, and normal lower extremity motor function was noted,   the above injectate was administered so that equal amounts of the injectate were placed at each foramen (level) into the transforaminal epidural space.   Additional Comments:  The patient tolerated the procedure well Dressing: 2 x 2 sterile gauze and Band-Aid    Post-procedure details: Patient was observed during the procedure. Post-procedure instructions were reviewed.  Patient left the clinic in stable condition.    Clinical History: MRI LUMBAR SPINE WITHOUT CONTRAST   TECHNIQUE: Multiplanar, multisequence MR imaging of the lumbar spine was performed. No intravenous contrast was administered.   COMPARISON:  08/07/2017   FINDINGS: Segmentation:  Standard.   Alignment:  Mild retrolisthesis at L3-4 and anterolisthesis at L4-5.   Vertebrae:  No fracture, evidence of discitis, or bone lesion.   Conus medullaris and cauda equina: Conus extends to the T12-L1 level. Conus and cauda equina appear normal.   Paraspinal and other soft tissues: No perispinal mass or inflammation. Postoperative scarring and atrophy at L3-4.   Disc levels:   L3-L4: Mild disc desiccation and narrowing with bulge. Mild facet spurring greater on the left.   L4-L5: Facet osteoarthritis with bulky spurring and anterolisthesis. Level of greatest degenerative disc narrowing. Right subarticular recess narrowing impinging on the right L5 nerve root, chronic. Foraminal narrowing that is noncompressive on the right.   L5-S1:Degenerative facet spurring. Asymmetric right disc bulging and endplate ridging with mild right foraminal stenosis.   IMPRESSION: 1. Lower lumbar spine degeneration especially affecting facets with L4-5 anterolisthesis. 2. L4-5 right subarticular recess narrowing with impingement of the right L5 nerve root, also seen in 2018. 3.  Noncompressive right foraminal narrowing at L4-5 and L5-S1.     Electronically Signed   By: Tiburcio Pea M.D.   On: 09/18/2023 07:02     Objective:  VS:  HT:    WT:   BMI:     BP:   HR: bpm  TEMP: ( )  RESP:  Physical Exam Vitals and nursing note reviewed.  Constitutional:      General: She is not in acute distress.    Appearance: Normal appearance. She is not ill-appearing.  HENT:     Head: Normocephalic and atraumatic.     Right Ear: External ear normal.     Left Ear: External ear normal.  Eyes:     Extraocular Movements: Extraocular movements intact.  Cardiovascular:     Rate and Rhythm: Normal rate.     Pulses: Normal pulses.  Pulmonary:     Effort: Pulmonary effort is normal. No respiratory distress.  Abdominal:     General: There is no distension.     Palpations: Abdomen is soft.  Musculoskeletal:        General: Tenderness present.     Cervical back: Neck supple.     Right lower leg: No edema.     Left lower leg: No edema.     Comments: Patient has good distal strength with no pain over the greater trochanters.  No clonus or  focal weakness.  Skin:    Findings: No erythema, lesion or rash.  Neurological:     General: No focal deficit present.     Mental Status: She is alert and oriented to person, place, and time.     Sensory: No sensory deficit.     Motor: No weakness or abnormal muscle tone.     Coordination: Coordination normal.  Psychiatric:        Mood and Affect: Mood normal.        Behavior: Behavior normal.      Imaging: XR C-ARM NO REPORT  Result Date: 09/24/2023 Please see Notes tab for imaging impression.

## 2023-09-24 NOTE — Procedures (Signed)
Lumbosacral Transforaminal Epidural Steroid Injection - Sub-Pedicular Approach with Fluoroscopic Guidance  Patient: Kendra Rodriguez      Date of Birth: November 03, 1966 MRN: [id number] PCP: [name], MD      Visit Date: 09/24/2023   Universal Protocol:    Date/Time: 09/24/2023  Consent Given By: the patient  Position: PRONE  Additional Comments: Vital signs were monitored before and after the procedure. Patient was prepped and draped in the usual sterile fashion. The correct patient, procedure, and site was verified.   Injection Procedure Details:   Procedure diagnoses: Lumbar radiculopathy [M54.16]    Meds Administered:  Meds ordered this encounter  Medications   methylPREDNISolone acetate (DEPO-MEDROL) injection 40 mg    Laterality: Right  Location/Site: L4  Needle:5.0 in., 22 ga.  Short bevel or Quincke spinal needle  Needle Placement: Transforaminal  Findings:    -Comments: Excellent flow of contrast along the nerve, nerve root and into the epidural space.  Procedure Details: After squaring off the end-plates to get a true AP view, the C-arm was positioned so that an oblique view of the foramen as noted above was visualized. The target area is just inferior to the "nose of the scotty dog" or sub pedicular. The soft tissues overlying this structure were infiltrated with 2-3 ml. of 1% Lidocaine without Epinephrine.  The spinal needle was inserted toward the target using a "trajectory" view along the fluoroscope beam.  Under AP and lateral visualization, the needle was advanced so it did not puncture dura and was located close the 6 O'Clock position of the pedical in AP tracterory. Biplanar projections were used to confirm position. Aspiration was confirmed to be negative for CSF and/or blood. A 1-2 ml. volume of Isovue-250 was injected and flow of contrast was noted at each level. Radiographs were obtained for documentation purposes.   After attaining the desired  flow of contrast documented above, a 0.5 to 1.0 ml test dose of 0.25% Marcaine was injected into each respective transforaminal space.  The patient was observed for 90 seconds post injection.  After no sensory deficits were reported, and normal lower extremity motor function was noted,   the above injectate was administered so that equal amounts of the injectate were placed at each foramen (level) into the transforaminal epidural space.   Additional Comments:  The patient tolerated the procedure well Dressing: 2 x 2 sterile gauze and Band-Aid    Post-procedure details: Patient was observed during the procedure. Post-procedure instructions were reviewed.  Patient left the clinic in stable condition.

## 2023-09-24 NOTE — Progress Notes (Signed)
Functional Pain Scale - descriptive words and definitions  Unmanageable (7)  Pain interferes with normal ADL's/nothing seems to help/sleep is very difficult/active distractions are very difficult to concentrate on. Severe range order  Average Pain 7  Pain down L leg tingling.    +Driver, -BT, -Dye Allergies.

## 2023-10-01 ENCOUNTER — Other Ambulatory Visit: Payer: Self-pay | Admitting: "Endocrinology

## 2023-10-01 DIAGNOSIS — E039 Hypothyroidism, unspecified: Secondary | ICD-10-CM

## 2023-10-06 ENCOUNTER — Other Ambulatory Visit: Payer: Self-pay

## 2023-10-06 DIAGNOSIS — E039 Hypothyroidism, unspecified: Secondary | ICD-10-CM

## 2023-10-06 MED ORDER — LEVOTHYROXINE SODIUM 50 MCG PO TABS: ORAL_TABLET | ORAL | 3 refills | Status: DC

## 2023-10-22 ENCOUNTER — Ambulatory Visit: Payer: Commercial Managed Care - HMO | Admitting: Orthopedic Surgery

## 2023-10-22 DIAGNOSIS — M5416 Radiculopathy, lumbar region: Secondary | ICD-10-CM | POA: Diagnosis not present

## 2023-10-22 NOTE — Progress Notes (Signed)
Orthopedic Spine Surgery Office Note   Assessment: Patient is a 57 y.o. female with low back pain that radiates into the bilateral lower extremities (R>L) at the anterolateral leg. Has a spondylolisthesis at L4/5 with foraminal and lateral recess stenosis at that level. Got significant relief of her leg pain with injection     Plan: -Patient has tried PT, acupuncture, Tylenol, NSAIDs, lyrica, lumbar steroid injection -Since she got relief of her leg pain with the injection and pain is currently tolerable, would recommend this treatment in the future if pain returns -Patient should return to office on an as needed basis     Patient expressed understanding of the plan and all questions were answered to the patient's satisfaction.    ___________________________________________________________________________     History:   Patient is a 57 y.o. female who presents today for follow up on her lumbar spine.  Patient has had low back pain that radiates into her bilateral lower extremities.  She feels it going into the lateral aspect of her thighs and anterolateral legs.  After our last visit, she got an injection with Dr. Alvester Morin.  She got significant relief of her leg pain with the injection.  She still has some low back pain that is worse at night.  She feels that the injections helped about 90% with her leg pain.  Her pain is currently tolerable.  She is able to do what she needs to do during the day.   Treatments tried: PT, Tylenol, acupuncture, NSAIDs, lyrica, lumbar steroid injection     Physical Exam:   General: no acute distress, appears stated age Neurologic: alert, answering questions appropriately, following commands Respiratory: unlabored breathing on room air, symmetric chest rise Psychiatric: appropriate affect, normal cadence to speech     MSK (spine):   -Strength exam                                                   Left                  Right EHL                               5/5                  5/5 TA                                 5/5                  5/5 GSC                             5/5                  5/5 Knee extension            5/5                  5/5 Hip flexion                    5/5  5/5   -Sensory exam                           Sensation intact to light touch in L3-S1 nerve distributions of bilateral lower extremities     Imaging: XRs of the lumbar spine from 06/05/2023 were previously independently reviewed and interpreted, showing a spondylolisthesis at L4/5 that shifts about 2.41mm between flexion and extension views. Disc height loss at L4/5. No other significant degenerative changes seen. No fracture or dislocation seen. PI of 75, LL of 73   MRI of the lumbar spine from 08/24/2023 was previously independent reviewed and interpreted, showing spondylolisthesis at L4/5.  Bilateral foraminal stenosis at L4/5.  DDD at L4/5 with laminectomy defect at that level.  Hypertrophic facets with lateral recess stenosis at L4/5 (R>L).  No other significant stenosis seen.     Patient name: Kendra Rodriguez Patient MRN: [id number] Date of visit: 10/22/23

## 2023-11-08 ENCOUNTER — Ambulatory Visit (HOSPITAL_COMMUNITY)
Admission: EM | Admit: 2023-11-08 | Discharge: 2023-11-08 | Disposition: A | Discharge status: Home / Self Care | Payer: Commercial Managed Care - HMO | Attending: Physician Assistant | Admitting: Physician Assistant

## 2023-11-08 ENCOUNTER — Encounter (HOSPITAL_COMMUNITY): Payer: Self-pay | Admitting: Emergency Medicine

## 2023-11-08 DIAGNOSIS — J069 Acute upper respiratory infection, unspecified: Secondary | ICD-10-CM | POA: Diagnosis not present

## 2023-11-08 DIAGNOSIS — B349 Viral infection, unspecified: Secondary | ICD-10-CM

## 2023-11-08 LAB — POCT INFLUENZA A/B
Influenza A, POC: NEGATIVE
Influenza B, POC: NEGATIVE

## 2023-11-08 MED ORDER — BENZONATATE 200 MG PO CAPS
200.0000 mg | ORAL_CAPSULE | Freq: Three times a day (TID) | ORAL | 0 refills | Status: DC | PRN
Start: 2023-11-08 — End: 2024-07-06

## 2023-11-08 NOTE — ED Triage Notes (Signed)
 Pt had cough that is productive with yellow-green phlegm, congestion, headache, chills that started yesterday. Took tylenol, last dose last night.

## 2023-11-08 NOTE — Discharge Instructions (Addendum)
 Very good to meet you today.  Your influenza test was negative.   You have a viral upper respiratory infection. This type of infection does not require antibiotics. Symptoms should improve over the next 7 to 10 days.   Some things that can make you feel better are: - Increased rest - Increasing fluid with water/sugar free electrolytes - Acetaminophen  and ibuprofen  as needed for fever/pain - Salt water gargling, chloraseptic spray and throat lozenges for sore throat - OTC guaifenesin  (Mucinex ) 600 mg twice daily for congestion - Saline sinus flushes or a neti pot - Humidifying the air

## 2023-11-08 NOTE — ED Provider Notes (Signed)
 [facility]   MRN: [id number] DOB: [birth year]  Subjective:   Kendra Rodriguez is a 58 y.o. female presenting for not feeling well.  Patient is here with her daughter, who helps with interpreting for her today.  Patient states that last night she started to have a cough, congestion, headache.  She feels chilled and achy today.  She did take Tylenol  last night.  She states that she had her flu vaccine for this season.  She reports that her 27-year-old granddaughter is recently sick with influenza.  They did not travel for the holidays.  Denies any chest pain or shortness of breath.  No gastrointestinal symptoms.  No current facility-administered medications for this encounter.  Current Outpatient Medications:    ALPRAZolam  (XANAX ) 0.5 MG tablet, Take 1 tablet (0.5 mg total) by mouth daily as needed for anxiety., Disp: 20 tablet, Rfl: 1   atorvastatin  (LIPITOR) 40 MG tablet, TAKE 1 TABLET(40 MG) BY MOUTH DAILY, Disp: 90 tablet, Rfl: 3   azithromycin  (ZITHROMAX ) 250 MG tablet, Sig as indicated, Disp: 6 tablet, Rfl: 0   benzonatate  (TESSALON ) 200 MG capsule, Take 1 capsule (200 mg total) by mouth 3 (three) times daily as needed., Disp: 20 capsule, Rfl: 0   cetirizine  (ZYRTEC  ALLERGY) 10 MG tablet, Take 1 tablet (10 mg total) by mouth daily., Disp: 30 tablet, Rfl: 0   cyclobenzaprine  (FLEXERIL ) 10 MG tablet, Take 1 tablet (10 mg total) by mouth 2 (two) times daily as needed for muscle spasms., Disp: 20 tablet, Rfl: 0   dextromethorphan-guaiFENesin  (MUCINEX  DM) 30-600 MG 12hr tablet, Take 1 tablet by mouth 2 (two) times daily., Disp: 12 tablet, Rfl: 1   diazepam  (VALIUM ) 5 MG tablet, Take 1 tablet (5 mg total) by mouth once as needed for up to 1 dose (prior to MRI for anxiety)., Disp: 1 tablet, Rfl: 0   famotidine  (PEPCID ) 20 MG tablet, Take 1 tablet (20 mg total) by mouth 2 (two) times daily., Disp: 30 tablet, Rfl: 0   fluticasone  (FLONASE ) 50 MCG/ACT nasal spray, SHAKE LIQUID AND  USE 1 SPRAY IN EACH NOSTRIL DAILY, Disp: 16 g, Rfl: 2   ipratropium (ATROVENT ) 0.03 % nasal spray, Place 2 sprays into both nostrils every 12 (twelve) hours., Disp: 30 mL, Rfl: 0   levothyroxine  (SYNTHROID ) 50 MCG tablet, TAKE 1 TABLET(50 MCG) BY MOUTH DAILY, Disp: 90 tablet, Rfl: 3   metFORMIN  (GLUCOPHAGE -XR) 500 MG 24 hr tablet, Take 1 tablet (500 mg total) by mouth 2 (two) times daily with a meal., Disp: 120 tablet, Rfl: 3   Olopatadine  HCl 0.2 % SOLN, Apply 1 drop to eye daily as needed., Disp: 2.5 mL, Rfl: 3   pantoprazole  (PROTONIX ) 40 MG tablet, Take 40 mg by mouth daily before breakfast., Disp: , Rfl:    predniSONE  (DELTASONE ) 50 MG tablet, Take 1 tab daily with breakfast for 3 days, Disp: 3 tablet, Rfl: 0   pregabalin  (LYRICA ) 75 MG capsule, Take 1 capsule (75 mg total) by mouth 2 (two) times daily., Disp: 84 capsule, Rfl: 0   trimethoprim -polymyxin b  (POLYTRIM ) ophthalmic solution, Place 1 drop into the left eye every 6 (six) hours., Disp: 10 mL, Rfl: 0   No Known Allergies  Past Medical History:  Diagnosis Date   Acute sinusitis    Allergy    Anxiety    Depression    NO MEDICATIONS - DOING OK NOW   Dizziness    Ear ache    RIGHT --HX OF MULTIPLE EAR  INFECTIONS AS A CHILD    GERD (gastroesophageal reflux disease)    Headache(784.0)    MIGRAINES   Leg pain, left    with swelling   Lumbar stenosis    PAIN BACK AND LEFT LEG AND NUMBNESS LEG AND FOOT   Peripheral vascular disease (HCC)    HX OF TREATMENT FOR VARICOSE VEINS RIGHT LEG   Thyroid  disease    TOLD BLOOD LEVELS SLIGHTLY ABNORMAL - BUT NOT REQUIRED MEDICATION     Past Surgical History:  Procedure Laterality Date   BACK SURGERY     bladder lift     AT SAME TIME OF HYSTERECTOMY   BRAVO PH STUDY N/A 07/26/2021   Procedure: BRAVO PH STUDY;  Surgeon: Rollin Dover, MD;  Location: [facility];  Service: Endoscopy;  Laterality: N/A;   CHOLECYSTECTOMY     ESOPHAGOGASTRODUODENOSCOPY (EGD) WITH PROPOFOL  N/A 07/26/2021    Procedure: ESOPHAGOGASTRODUODENOSCOPY (EGD) WITH PROPOFOL ;  Surgeon: Rollin Dover, MD;  Location: [facility];  Service: Endoscopy;  Laterality: N/A;   LUMBAR LAMINECTOMY/DECOMPRESSION MICRODISCECTOMY Left 10/24/2014   Procedure: COMPLETE LUMBAR DECOMPRESSION L4-L5 CENTRAL AND MICRODISCECTOMY L4-L5 LEFT;  Surgeon: Tanda DELENA Heading, MD;  Location: [facility];  Service: Orthopedics;  Laterality: Left;   POLYPECTOMY  07/26/2021   Procedure: POLYPECTOMY;  Surgeon: Rollin Dover, MD;  Location: [facility];  Service: Endoscopy;;   VAGINAL HYSTERECTOMY      Family History  Problem Relation Age of Onset   Hypertension Mother    Arthritis Mother    Diabetes Mother    Hyperlipidemia Mother    Arthritis Sister    Rheum arthritis Brother    Autoimmune disease Brother    Lung disease Brother    Arthritis Sister    Gout Son    High Cholesterol Daughter    Thyroid  disease Neg Hx     Social History   Tobacco Use   Smoking status: Never   Smokeless tobacco: Never  Vaping Use   Vaping status: Never Used  Substance Use Topics   Alcohol use: No   Drug use: Never    ROS REFER TO HPI FOR PERTINENT POSITIVES AND NEGATIVES   Objective:   Vitals: BP 121/71 (BP Location: Left Arm)   Pulse 78   Temp 98.4 F (36.9 C) (Oral)   Resp 16   SpO2 98%   Physical Exam Vitals and nursing note reviewed.  Constitutional:      General: She is not in acute distress.    Appearance: Normal appearance. She is not ill-appearing.  HENT:     Head: Normocephalic.     Right Ear: Tympanic membrane, ear canal and external ear normal.     Left Ear: Tympanic membrane, ear canal and external ear normal.     Nose: No congestion.     Mouth/Throat:     Mouth: Mucous membranes are moist.     Pharynx: No oropharyngeal exudate or posterior oropharyngeal erythema.  Eyes:     Extraocular Movements: Extraocular movements intact.     Conjunctiva/sclera: Conjunctivae normal.     Pupils: Pupils are equal, round,  and reactive to light.  Cardiovascular:     Rate and Rhythm: Normal rate and regular rhythm.     Pulses: Normal pulses.     Heart sounds: Normal heart sounds. No murmur heard. Pulmonary:     Effort: Pulmonary effort is normal. No respiratory distress.     Breath sounds: Normal breath sounds. No wheezing.     Comments: Scattered dry cough  Musculoskeletal:  Cervical back: Normal range of motion.  Skin:    General: Skin is warm.  Neurological:     Mental Status: She is alert and oriented to person, place, and time.  Psychiatric:        Mood and Affect: Mood normal.        Behavior: Behavior normal.     Results for orders placed or performed during the hospital encounter of 11/08/23 (from the past 24 hours)  POC Influenza A/B     Status: Normal   Collection Time: 11/08/23  5:00 PM  Result Value Ref Range   Influenza A, POC Negative    Influenza B, POC Negative     Assessment and Plan :   PDMP not reviewed this encounter.  1. Acute upper respiratory infection   2. Viral illness     Reassured patient.  Point-of-care influenza test was negative.  She is nontoxic appearing on exam, vital signs are stable.  We discussed potentially COVID-19 testing, but potential for false negative this early into her symptoms.  She agreed to go home and work on supportive care for her likely viral illness, she might choose to test for COVID-19 in the next few days.  Gave a prescription for Tessalon  Perles to help with the cough.  Additional supportive care discussed with the patient and provided in AVS for her.  Return to care precautions discussed.   AllwardtMardy HERO, PA-C 11/08/23 1721

## 2023-11-09 ENCOUNTER — Ambulatory Visit: Payer: Self-pay

## 2023-11-09 ENCOUNTER — Encounter: Payer: Self-pay | Admitting: Emergency Medicine

## 2023-11-09 NOTE — Telephone Encounter (Signed)
 She has appointment to see [place] tomorrow.  Will wait until then to make a better assessment.  Continue instructions as per urgent care center visit.  Tylenol and or Advil for headaches and bodyaches.  Thanks.

## 2023-11-10 ENCOUNTER — Encounter: Payer: Self-pay | Admitting: Emergency Medicine

## 2023-11-10 ENCOUNTER — Ambulatory Visit (INDEPENDENT_AMBULATORY_CARE_PROVIDER_SITE_OTHER): Payer: Commercial Managed Care - HMO | Admitting: Emergency Medicine

## 2023-11-10 VITALS — BP 128/86 | HR 87 | Temp 99.6°F | Ht 61.0 in | Wt 193.0 lb

## 2023-11-10 DIAGNOSIS — R6889 Other general symptoms and signs: Secondary | ICD-10-CM

## 2023-11-10 DIAGNOSIS — R051 Acute cough: Secondary | ICD-10-CM | POA: Diagnosis not present

## 2023-11-10 DIAGNOSIS — J101 Influenza due to other identified influenza virus with other respiratory manifestations: Secondary | ICD-10-CM | POA: Insufficient documentation

## 2023-11-10 LAB — POC COVID19 BINAXNOW: SARS Coronavirus 2 Ag: NEGATIVE

## 2023-11-10 LAB — POCT INFLUENZA A/B
Influenza A, POC: POSITIVE — AB
Influenza B, POC: NEGATIVE

## 2023-11-10 MED ORDER — OSELTAMIVIR PHOSPHATE 75 MG PO CAPS: 75.0000 mg | ORAL_CAPSULE | Freq: Two times a day (BID) | ORAL | 0 refills | Status: AC

## 2023-11-10 MED ORDER — HYDROCODONE-ACETAMINOPHEN 5-325 MG PO TABS: 1.0000 | ORAL_TABLET | Freq: Four times a day (QID) | ORAL | 0 refills | Status: DC | PRN

## 2023-11-10 NOTE — Assessment & Plan Note (Signed)
 Symptom management discussed Having a lot of headaches and bodyaches Recommend Tylenol for mild to moderate pain and Norco for moderate to severe pain Advised to rest and stay well-hydrated

## 2023-11-10 NOTE — Assessment & Plan Note (Signed)
 Clinically stable.  No complications. No signs of pneumonia. Recommend to start Tamiflu  75 mg twice a day for 5 days Symptom management discussed ED precautions given Advised to rest and stay well-hydrated Advised to contact the office if no better or worse during the next several days

## 2023-11-10 NOTE — Progress Notes (Signed)
 Kendra Rodriguez 58 y.o.   Chief Complaint  Patient presents with   Cough    Cough, headache, sore throat, chills, no fever, body aches, started 3 days ago. She states 3 family member has the flu. She tested for Flu on Sunday and it was negative     HISTORY OF PRESENT ILLNESS: Acute problem visit today. This is a 58 y.o. female complaining of flulike symptoms that started last Saturday morning and got worse by Sunday Still complaining of chest congestion and productive cough Also complaining of headaches and bodyaches Family members were recently diagnosed with influenza Was seen at an urgent care center last Sunday.  Prescribed cough medication. Denies fever or chills.  Able to eat and drink.  Denies nausea or vomiting.  Denies abdominal pain or diarrhea. No other complaints or medical concerns today.  Cough Associated symptoms include headaches and myalgias. Pertinent negatives include no chest pain, chills, fever, rash or shortness of breath.     Prior to Admission medications   Medication Sig Start Date End Date Taking? Authorizing Provider  ALPRAZolam  (XANAX ) 0.5 MG tablet Take 1 tablet (0.5 mg total) by mouth daily as needed for anxiety. 12/31/22  Yes Purcell Emil Schanz, MD  atorvastatin  (LIPITOR) 40 MG tablet TAKE 1 TABLET(40 MG) BY MOUTH DAILY 04/21/23  Yes Taeden Geller, Emil Schanz, MD  azithromycin  (ZITHROMAX ) 250 MG tablet Sig as indicated 09/02/23  Yes Tillman Kazmierski, Emil Schanz, MD  benzonatate  (TESSALON ) 200 MG capsule Take 1 capsule (200 mg total) by mouth 3 (three) times daily as needed. 11/08/23  Yes Allwardt, Alyssa M, PA-C  cetirizine  (ZYRTEC  ALLERGY) 10 MG tablet Take 1 tablet (10 mg total) by mouth daily. 02/04/23  Yes Christopher Savannah, PA-C  cyclobenzaprine  (FLEXERIL ) 10 MG tablet Take 1 tablet (10 mg total) by mouth 2 (two) times daily as needed for muscle spasms. 05/18/23  Yes Zelaya, Oscar A, PA-C  dextromethorphan-guaiFENesin  (MUCINEX  DM) 30-600 MG 12hr tablet Take 1 tablet by  mouth 2 (two) times daily. 09/02/23  Yes Tida Saner, Emil Schanz, MD  diazepam  (VALIUM ) 5 MG tablet Take 1 tablet (5 mg total) by mouth once as needed for up to 1 dose (prior to MRI for anxiety). 08/13/23  Yes Georgina Ozell LABOR, MD  famotidine  (PEPCID ) 20 MG tablet Take 1 tablet (20 mg total) by mouth 2 (two) times daily. 02/04/23  Yes Christopher Savannah, PA-C  fluticasone  (FLONASE ) 50 MCG/ACT nasal spray SHAKE LIQUID AND USE 1 SPRAY IN EACH NOSTRIL DAILY 04/09/23  Yes Elsbeth Yearick Jose, MD  ipratropium (ATROVENT ) 0.03 % nasal spray Place 2 sprays into both nostrils every 12 (twelve) hours. 06/22/23  Yes Mayer, Jodi R, NP  levothyroxine  (SYNTHROID ) 50 MCG tablet TAKE 1 TABLET(50 MCG) BY MOUTH DAILY 10/06/23  Yes Thapa, Sudan, MD  metFORMIN  (GLUCOPHAGE -XR) 500 MG 24 hr tablet Take 1 tablet (500 mg total) by mouth 2 (two) times daily with a meal. 05/21/23  Yes Motwani, Komal, MD  Olopatadine  HCl 0.2 % SOLN Apply 1 drop to eye daily as needed. 02/04/23  Yes Christopher Savannah, PA-C  pantoprazole  (PROTONIX ) 40 MG tablet Take 40 mg by mouth daily before breakfast.   Yes [provider]  predniSONE  (DELTASONE ) 50 MG tablet Take 1 tab daily with breakfast for 3 days 07/14/23  Yes Stuart Vernell Norris, PA-C  trimethoprim -polymyxin b  (POLYTRIM ) ophthalmic solution Place 1 drop into the left eye every 6 (six) hours. 07/14/23  Yes Stuart Vernell Norris, PA-C  pregabalin  (LYRICA ) 75 MG capsule Take 1 capsule (75  mg total) by mouth 2 (two) times daily. 07/02/23 08/13/23  Georgina Ozell LABOR, MD    No Known Allergies  Patient Active Problem List   Diagnosis Date Noted   Raynaud's syndrome with gangrene (HCC) 09/15/2023   Flu-like symptoms 09/02/2023   Chronic pain of left knee 07/30/2023   Dysuria 03/09/2023   Chronic pelvic pain in female 03/09/2023   Chronic bilateral low back pain without sciatica 03/09/2023   Primary osteoarthritis of left knee 09/08/2022   Vitamin D  deficiency 09/08/2022   Hypothyroidism 11/29/2021    Body mass index (BMI) of 35.0-35.9 in adult 06/04/2020   History of migraine headaches 12/06/2019   Dyslipidemia 06/30/2019   GERD with esophagitis 06/30/2019   Chronic vertigo 04/13/2018   Prediabetes 10/17/2015   Spinal stenosis, lumbar region, with neurogenic claudication 10/24/2014    Past Medical History:  Diagnosis Date   Acute sinusitis    Allergy    Anxiety    Depression    NO MEDICATIONS - DOING OK NOW   Dizziness    Ear ache    RIGHT --HX OF MULTIPLE EAR INFECTIONS AS A CHILD    GERD (gastroesophageal reflux disease)    Headache(784.0)    MIGRAINES   Leg pain, left    with swelling   Lumbar stenosis    PAIN BACK AND LEFT LEG AND NUMBNESS LEG AND FOOT   Peripheral vascular disease (HCC)    HX OF TREATMENT FOR VARICOSE VEINS RIGHT LEG   Thyroid  disease    TOLD BLOOD LEVELS SLIGHTLY ABNORMAL - BUT NOT REQUIRED MEDICATION    Past Surgical History:  Procedure Laterality Date   BACK SURGERY     bladder lift     AT SAME TIME OF HYSTERECTOMY   BRAVO PH STUDY N/A 07/26/2021   Procedure: BRAVO PH STUDY;  Surgeon: Rollin Dover, MD;  Location: [facility];  Service: Endoscopy;  Laterality: N/A;   CHOLECYSTECTOMY     ESOPHAGOGASTRODUODENOSCOPY (EGD) WITH PROPOFOL  N/A 07/26/2021   Procedure: ESOPHAGOGASTRODUODENOSCOPY (EGD) WITH PROPOFOL ;  Surgeon: Rollin Dover, MD;  Location: [facility];  Service: Endoscopy;  Laterality: N/A;   LUMBAR LAMINECTOMY/DECOMPRESSION MICRODISCECTOMY Left 10/24/2014   Procedure: COMPLETE LUMBAR DECOMPRESSION L4-L5 CENTRAL AND MICRODISCECTOMY L4-L5 LEFT;  Surgeon: Tanda LABOR Heading, MD;  Location: [facility];  Service: Orthopedics;  Laterality: Left;   POLYPECTOMY  07/26/2021   Procedure: POLYPECTOMY;  Surgeon: Rollin Dover, MD;  Location: [facility];  Service: Endoscopy;;   VAGINAL HYSTERECTOMY      Social History   Socioeconomic History   Marital status: Married    Spouse name: Not on file   Number of children: Not on file   Years  of education: Not on file   Highest education level: 6th grade  Occupational History   Not on file  Tobacco Use   Smoking status: Never   Smokeless tobacco: Never  Vaping Use   Vaping status: Never Used  Substance and Sexual Activity   Alcohol use: No   Drug use: Never   Sexual activity: Yes  Other Topics Concern   Not on file  Social History Narrative   ** Merged History Encounter **       Social Drivers of Health   Financial Resource Strain: Low Risk  (09/18/2023)   Overall Financial Resource Strain (CARDIA)    Difficulty of Paying Living Expenses: Not very hard  Food Insecurity: No Food Insecurity (09/18/2023)   Hunger Vital Sign    Worried About Running Out of Food in the  Last Year: Never true    Ran Out of Food in the Last Year: Never true  Transportation Needs: No Transportation Needs (09/18/2023)   PRAPARE - Administrator, Civil Service (Medical): No    Lack of Transportation (Non-Medical): No  Physical Activity: Insufficiently Active (09/18/2023)   Exercise Vital Sign    Days of Exercise per Week: 3 days    Minutes of Exercise per Session: 20 min  Stress: No Stress Concern Present (09/18/2023)   [employer] of Occupational Health - Occupational Stress Questionnaire    Feeling of Stress : Only a little  Social Connections: Unknown (09/18/2023)   Social Connection and Isolation Panel [NHANES]    Frequency of Communication with Friends and Family: More than three times a week    Frequency of Social Gatherings with Friends and Family: More than three times a week    Attends Religious Services: More than 4 times per year    Active Member of [employer] or Organizations: No    Attends Engineer, Structural: Not on file    Marital Status: Not on file  Intimate Partner Violence: Not on file    Family History  Problem Relation Age of Onset   Hypertension Mother    Arthritis Mother    Diabetes Mother    Hyperlipidemia Mother    Arthritis  Sister    Rheum arthritis Brother    Autoimmune disease Brother    Lung disease Brother    Arthritis Sister    Gout Son    High Cholesterol Daughter    Thyroid  disease Neg Hx      Review of Systems  Constitutional: Negative.  Negative for chills and fever.  HENT:  Positive for congestion.   Respiratory:  Positive for cough and sputum production. Negative for shortness of breath.   Cardiovascular: Negative.  Negative for chest pain and palpitations.  Musculoskeletal:  Positive for myalgias.  Skin: Negative.  Negative for rash.  Neurological:  Positive for headaches.  All other systems reviewed and are negative.   Vitals:   11/10/23 1059  BP: 128/86  Pulse: 87  Temp: 99.6 F (37.6 C)  SpO2: 97%    Physical Exam Vitals reviewed.  Constitutional:      Appearance: Normal appearance.  HENT:     Head: Normocephalic.     Right Ear: Tympanic membrane, ear canal and external ear normal.     Left Ear: Tympanic membrane, ear canal and external ear normal.     Mouth/Throat:     Mouth: Mucous membranes are moist.     Pharynx: Oropharynx is clear.  Eyes:     Extraocular Movements: Extraocular movements intact.     Conjunctiva/sclera: Conjunctivae normal.     Pupils: Pupils are equal, round, and reactive to light.  Cardiovascular:     Rate and Rhythm: Normal rate and regular rhythm.     Pulses: Normal pulses.     Heart sounds: Normal heart sounds.  Pulmonary:     Effort: Pulmonary effort is normal.     Breath sounds: Normal breath sounds.  Musculoskeletal:     Cervical back: No tenderness.  Lymphadenopathy:     Cervical: No cervical adenopathy.  Skin:    General: Skin is warm and dry.     Capillary Refill: Capillary refill takes less than 2 seconds.  Neurological:     General: No focal deficit present.     Mental Status: She is alert and oriented to person, place, and time.  Psychiatric:        Mood and Affect: Mood normal.        Behavior: Behavior normal.     Results for orders placed or performed in visit on 11/10/23 (from the past 24 hours)  POCT Influenza A/B     Status: Abnormal   Collection Time: 11/10/23 11:22 AM  Result Value Ref Range   Influenza A, POC Positive (A) Negative   Influenza B, POC Negative Negative  POC COVID-19     Status: None   Collection Time: 11/10/23 11:23 AM  Result Value Ref Range   SARS Coronavirus 2 Ag Negative Negative      ASSESSMENT & PLAN: A total of 34 minutes was spent with the patient and counseling/coordination of care regarding preparing for this visit, review of most recent office visit notes, review of chronic medical conditions under management, review of all medications, diagnosis of influenza and management options, symptom management, pain management, ED precautions, prognosis, documentation and need for follow-up if no better or worse during the next several days  Problem List Items Addressed This Visit       Respiratory   Influenza A - Primary   Clinically stable.  No complications. No signs of pneumonia. Recommend to start Tamiflu  75 mg twice a day for 5 days Symptom management discussed ED precautions given Advised to rest and stay well-hydrated Advised to contact the office if no better or worse during the next several days      Relevant Medications   oseltamivir  (TAMIFLU ) 75 MG capsule   HYDROcodone -acetaminophen  (NORCO) 5-325 MG tablet     Other   Flu-like symptoms   Symptom management discussed Having a lot of headaches and bodyaches Recommend Tylenol  for mild to moderate pain and Norco for moderate to severe pain Advised to rest and stay well-hydrated      Relevant Medications   HYDROcodone -acetaminophen  (NORCO) 5-325 MG tablet   Other Relevant Orders   POC COVID-19 (Completed)   POCT Influenza A/B (Completed)   Acute cough   Cough management discussed Advised to take over-the-counter Mucinex  DM and cough drops Advised to rest and stay well-hydrated       Relevant Orders   POC COVID-19 (Completed)   POCT Influenza A/B (Completed)   Patient Instructions  Gripe en los adultos Influenza, Adult A la gripe tambin se la conoce como "influenza". Es una advance auto , la nariz y la garganta (vas respiratorias). Se transmite fcilmente de persona a persona (es contagiosa). La gripe causa sntomas que son lubrizol corporation de un resfro, junto con fiebre alta y dolores corporales. Cules son las causas? La causa de esta afeccin es el virus de la influenza. Puede contraer el virus de las siguientes maneras: Al inhalar gotitas que quedan en el aire despus de que una persona infectada con gripe tosi o estornud. Al tocar algo que est contaminado con el virus y tenet healthcare mano a la boca, la nariz o los ojos. Qu incrementa el riesgo? Hay ciertas cosas que lo pueden hacer ms propenso a warden/ranger. Estas incluyen lo siguiente: No lavarse las manos con frecuencia. Tener contacto cercano con muchas personas durante la temporada de resfro y gripe. Tocarse la boca, los ojos o la nariz sin antes lavarse las manos. No recibir la teachers insurance and annuity association. Puede correr un mayor riesgo de [place] gripe, y [place] graves, como una infeccin pulmonar (neumona), si usted: Es mayor de 65 aos de edad. Est embarazada. Tiene debilitado el  sistema que combate las defensas (sistema inmunitario) debido a una enfermedad o a que toma determinados medicamentos. Tiene una afeccin a largo plazo (crnica), como las siguientes: Enfermedad cardaca, renal o pulmonar. Diabetes. Asma. Tiene un trastorno heptico. Tiene mucho sobrepeso (obesidad mrbida). Tiene anemia. Cules son los signos o sntomas? Los sntomas normalmente comienzan de repente y elvera milliner [address] y [address] Pueden incluir los siguientes: Lajune y escalofros. Dolores de [place], dolores en el cuerpo o dolores musculares. Dolor de advertising copywriter. Tos. Secrecin o congestin  nasal. Molestias en el pecho. No querer comer tanto como lo hace normalmente. Sensacin de debilidad o cansancio. Mareos. Malestar estomacal o vmitos. Cmo se trata? Si la gripe se detecta de forma temprana, puede recibir tratamiento con medicamentos antivirales. Esto puede ayudar a reducir la gravedad y la duracin de la enfermedad. Se los administrarn por boca o a travs de un tubo (catter) intravenoso. Cuidarse en su hogar puede ayudar a que mejoren los sntomas. El mdico puede recomendarle lo siguiente: Tomar medicamentos de sales promotion account executive. Beber abundante lquido. La gripe suele desaparecer sola. Si tiene sntomas muy graves u otros problemas, puede recibir tratamiento en un hospital. Siga estas instrucciones en su casa:     Actividad Descanse todo lo que sea necesario. Duerma lo suficiente. Doraine en su casa y no concurra al veda o a la escuela, como se lo haya indicado el mdico. No salga de su casa hasta que no haya tenido fiebre por 24 horas sin tomar medicamentos. Salga de su casa solamente para ir al american express. Comida y bebida Tome una SRO (solucin de rehidratacin oral). Es ignacia bebida que se vende en farmacias y tiendas. Beba suficiente lquido como para pharmacologist la orina de color amarillo plido. En la medida en que pueda, beba lquidos transparentes en pequeas cantidades. Los lquidos transparentes son, por ejemplo: Grey. Trocitos de hielo. Jugo de frutas mezclado con agua. Bebidas deportivas de bajas caloras. Coma alimentos suaves que sean fciles de digerir. En la medida que pueda, consuma pequeas cantidades. Estos alimentos incluyen: Bananas. Pur de praxair. Arroz. Carnes magras. Tostadas. Galletas. No coma ni beba lo siguiente: Lquidos con alto contenido de azcar o cafena. Alcohol. Alimentos condimentados o con alto contenido de grasa. Indicaciones generales Use los medicamentos de venta libre y los recetados solamente como se lo haya indicado el  mdico. Use un humidificador de aire fro para que el aire de su casa est ms hmedo. Esto puede facilitar la respiracin. Cuando utilice un humidificador de vapor fro, lmpielo a diario. Vace el agua y cmbiela por agua limpia. Al toser o estornudar, cbrase la boca y la [place]. Lvese las manos frecuentemente con agua y jabn y durante al menos 20 segundos. Esto tambin es importante despus de toser o engineering geologist. Si no dispone de agua y jabn, use desinfectante para manos con alcohol. Cumpla con todas las visitas de seguimiento. Cmo se previene?  Colquese la vacuna antigripal todos los [place]. Puede colocarse la vacuna contra la gripe a fines de verano, en otoo o en invierno. Pregntele al mdico cundo debe aplicarse la vacuna contra la gripe. Evite el contacto con personas que estn enfermas durante el otoo y el invierno. Es la temporada del resfro y emergency planning/management officer. Comunquese con un mdico si: Tiene sntomas nuevos. Tiene los siguientes sntomas: Dolor de [place]. Materia fecal lquida (diarrea). Lajune. La tos empeora. Empieza a tener ms mucosidad. Tiene programme researcher, broadcasting/film/video. Vomita. Solicite ayuda de inmediato si: Le falta el aire. Tiene dificultad para respirar. La piel  o las uas se ponen de un color azulado. Presenta dolor muy intenso o rigidez en el cuello. Tiene dolor de cabeza repentino. Le duele la cara o el odo de forma repentina. No puede comer ni beber sin vomitar. Estos sntomas pueden representar un problema grave que constituye radio broadcast assistant. Solicite atencin mdica de inmediato. Comunquese con el servicio de emergencias de su localidad (911 en los [place]). No espere a ver si los sntomas desaparecen. No conduzca por sus propios medios officemax incorporated. Resumen A la gripe tambin se la conoce como "influenza". Es una advance auto , la nariz y administrator. Se transmite fcilmente de una persona a otra. Use los medicamentos de venta libre y  los recetados solamente como se lo haya indicado el mdico. Aplicarse la vacuna contra la gripe todos los aos es la mejor manera de no contagiarse la gripe. Esta informacin no tiene theme park manager el consejo del mdico. Asegrese de hacerle al mdico cualquier pregunta que tenga. Document Revised: 08/16/2020 Document Reviewed: 08/16/2020 Elsevier Patient Education  2024 Elsevier Inc.       Emil Schaumann, MD [facility]

## 2023-11-10 NOTE — Assessment & Plan Note (Signed)
 Cough management discussed Advised to take over-the-counter Mucinex DM and cough drops Advised to rest and stay well-hydrated

## 2023-11-10 NOTE — Patient Instructions (Signed)
 Gripe en los adultos Influenza, Adult A la gripe tambin se la conoce como "influenza". Es una advance auto , la nariz y la garganta (vas respiratorias). Se transmite fcilmente de persona a persona (es contagiosa). La gripe causa sntomas que son lubrizol corporation de un resfro, junto con fiebre alta y dolores corporales. Cules son las causas? La causa de esta afeccin es el virus de la influenza. Puede contraer el virus de las siguientes maneras: Al inhalar gotitas que quedan en el aire despus de que una persona infectada con gripe tosi o estornud. Al tocar algo que est contaminado con el virus y tenet healthcare mano a la boca, la nariz o los ojos. Qu incrementa el riesgo? Hay ciertas cosas que lo pueden hacer ms propenso a warden/ranger. Estas incluyen lo siguiente: No lavarse las manos con frecuencia. Tener contacto cercano con muchas personas durante la temporada de resfro y gripe. Tocarse la boca, los ojos o la nariz sin antes lavarse las manos. No recibir la teachers insurance and annuity association. Puede correr un mayor riesgo de [place] gripe, y [place] graves, como una infeccin pulmonar (neumona), si usted: Es mayor de 65 aos de edad. Est embarazada. Tiene debilitado el sistema que combate las defensas (sistema inmunitario) debido a una enfermedad o a que toma determinados medicamentos. Tiene una afeccin a largo plazo (crnica), como las siguientes: Enfermedad cardaca, renal o pulmonar. Diabetes. Asma. Tiene un trastorno heptico. Tiene mucho sobrepeso (obesidad mrbida). Tiene anemia. Cules son los signos o sntomas? Los sntomas normalmente comienzan de repente y elvera milliner [address] y [address]. Pueden incluir los siguientes: Lajune y escalofros. Dolores de [place], dolores en el cuerpo o dolores musculares. Dolor de advertising copywriter. Tos. Secrecin o congestin nasal. Molestias en el pecho. No querer comer tanto como lo hace normalmente. Sensacin de debilidad o  cansancio. Mareos. Malestar estomacal o vmitos. Cmo se trata? Si la gripe se detecta de forma temprana, puede recibir tratamiento con medicamentos antivirales. Esto puede ayudar a reducir la gravedad y la duracin de la enfermedad. Se los administrarn por boca o a travs de un tubo (catter) intravenoso. Cuidarse en su hogar puede ayudar a que mejoren los sntomas. El mdico puede recomendarle lo siguiente: Tomar medicamentos de sales promotion account executive. Beber abundante lquido. La gripe suele desaparecer sola. Si tiene sntomas muy graves u otros problemas, puede recibir tratamiento en un hospital. Siga estas instrucciones en su casa:     Actividad Descanse todo lo que sea necesario. Duerma lo suficiente. Doraine en su casa y no concurra al veda o a la escuela, como se lo haya indicado el mdico. No salga de su casa hasta que no haya tenido fiebre por 24 horas sin tomar medicamentos. Salga de su casa solamente para ir al american express. Comida y bebida Tome una SRO (solucin de rehidratacin oral). Es ignacia bebida que se vende en farmacias y tiendas. Beba suficiente lquido como para pharmacologist la orina de color amarillo plido. En la medida en que pueda, beba lquidos transparentes en pequeas cantidades. Los lquidos transparentes son, por ejemplo: Grey. Trocitos de hielo. Jugo de frutas mezclado con agua. Bebidas deportivas de bajas caloras. Coma alimentos suaves que sean fciles de digerir. En la medida que pueda, consuma pequeas cantidades. Estos alimentos incluyen: Bananas. Pur de praxair. Arroz. Carnes magras. Tostadas. Galletas. No coma ni beba lo siguiente: Lquidos con alto contenido de azcar o cafena. Alcohol. Alimentos condimentados o con alto contenido de grasa. Indicaciones generales Use los medicamentos de [address] y los  recetados solamente como se lo haya indicado el mdico. Use un humidificador de aire fro para que el aire de su casa est ms hmedo. Esto puede  facilitar la respiracin. Cuando utilice un humidificador de vapor fro, lmpielo a diario. Vace el agua y cmbiela por agua limpia. Al toser o estornudar, cbrase la boca y la [place]. Lvese las manos frecuentemente con agua y jabn y durante al menos 20 segundos. Esto tambin es importante despus de toser o engineering geologist. Si no dispone de agua y jabn, use desinfectante para manos con alcohol. Cumpla con todas las visitas de seguimiento. Cmo se previene?  Colquese la vacuna antigripal todos los [place]. Puede colocarse la vacuna contra la gripe a fines de verano, en otoo o en invierno. Pregntele al mdico cundo debe aplicarse la vacuna contra la gripe. Evite el contacto con personas que estn enfermas durante el otoo y el invierno. Es la temporada del resfro y emergency planning/management officer. Comunquese con un mdico si: Tiene sntomas nuevos. Tiene los siguientes sntomas: Dolor de [place]. Materia fecal lquida (diarrea). Lajune. La tos empeora. Empieza a tener ms mucosidad. Tiene programme researcher, broadcasting/film/video. Vomita. Solicite ayuda de inmediato si: Le falta el aire. Tiene dificultad para respirar. La piel o las uas se ponen de un color azulado. Presenta dolor muy intenso o rigidez en el cuello. Tiene dolor de cabeza repentino. Le duele la cara o el odo de forma repentina. No puede comer ni beber sin vomitar. Estos sntomas pueden representar un problema grave que constituye radio broadcast assistant. Solicite atencin mdica de inmediato. Comunquese con el servicio de emergencias de su localidad (911 en los [place]). No espere a ver si los sntomas desaparecen. No conduzca por sus propios medios officemax incorporated. Resumen A la gripe tambin se la conoce como "influenza". Es una advance auto , la nariz y administrator. Se transmite fcilmente de una persona a otra. Use los medicamentos de venta libre y los recetados solamente como se lo haya indicado el mdico. Aplicarse la vacuna contra la gripe  todos los aos es la mejor manera de no contagiarse la gripe. Esta informacin no tiene theme park manager el consejo del mdico. Asegrese de hacerle al mdico cualquier pregunta que tenga. Document Revised: 08/16/2020 Document Reviewed: 08/16/2020 Elsevier Patient Education  2024 [employer].

## 2024-01-11 NOTE — Progress Notes (Deleted)
 Office Visit Note  Patient: [name]             Date of Birth: [birth year]           MRN: [id number]             PCP: [name], MD Referring: [name], * Visit Date: 01/25/2024 Occupation: @GUAROCC @  Subjective:  No chief complaint on file.   History of Present Illness: Kendra Rodriguez is a 58 y.o. female ***     Activities of Daily Living:  Patient reports morning stiffness for *** {minute/hour:19697}.   Patient {ACTIONS;DENIES/REPORTS:21021675::"Denies"} nocturnal pain.  Difficulty dressing/grooming: {ACTIONS;DENIES/REPORTS:21021675::"Denies"} Difficulty climbing stairs: {ACTIONS;DENIES/REPORTS:21021675::"Denies"} Difficulty getting out of chair: {ACTIONS;DENIES/REPORTS:21021675::"Denies"} Difficulty using hands for taps, buttons, cutlery, and/or writing: {ACTIONS;DENIES/REPORTS:21021675::"Denies"}  No Rheumatology ROS completed.   PMFS History:  Patient Active Problem List   Diagnosis Date Noted   Influenza A 11/10/2023   Acute cough 11/10/2023   Raynaud's syndrome with gangrene (HCC) 09/15/2023   Flu-like symptoms 09/02/2023   Chronic pain of left knee 07/30/2023   Dysuria 03/09/2023   Chronic pelvic pain in female 03/09/2023   Chronic bilateral low back pain without sciatica 03/09/2023   Primary osteoarthritis of left knee 09/08/2022   Vitamin D deficiency 09/08/2022   Hypothyroidism 11/29/2021   Body mass index (BMI) of 35.0-35.9 in adult 06/04/2020   History of migraine headaches 12/06/2019   Dyslipidemia 06/30/2019   GERD with esophagitis 06/30/2019   Chronic vertigo 04/13/2018   Prediabetes 10/17/2015   Spinal stenosis, lumbar region, with neurogenic claudication 10/24/2014    Past Medical History:  Diagnosis Date   Acute sinusitis    Allergy    Anxiety    Depression    NO MEDICATIONS - DOING OK NOW   Dizziness    Ear ache    RIGHT --HX OF MULTIPLE EAR INFECTIONS AS A CHILD    GERD (gastroesophageal reflux disease)     Headache(784.0)    MIGRAINES   Leg pain, left    with swelling   Lumbar stenosis    PAIN BACK AND LEFT LEG AND NUMBNESS LEG AND FOOT   Peripheral vascular disease (HCC)    HX OF TREATMENT FOR VARICOSE VEINS RIGHT LEG   Thyroid disease    TOLD BLOOD LEVELS SLIGHTLY ABNORMAL - BUT NOT REQUIRED MEDICATION    Family History  Problem Relation Age of Onset   Hypertension Mother    Arthritis Mother    Diabetes Mother    Hyperlipidemia Mother    Arthritis Sister    Rheum arthritis Brother    Autoimmune disease Brother    Lung disease Brother    Arthritis Sister    Gout Son    High Cholesterol Daughter    Thyroid disease Neg Hx    Past Surgical History:  Procedure Laterality Date   BACK SURGERY     bladder lift     AT SAME TIME OF HYSTERECTOMY   BRAVO PH STUDY N/A 07/26/2021   Procedure: BRAVO PH STUDY;  Surgeon: Jeani Hawking, MD;  Location: [facility];  Service: Endoscopy;  Laterality: N/A;   CHOLECYSTECTOMY     ESOPHAGOGASTRODUODENOSCOPY (EGD) WITH PROPOFOL N/A 07/26/2021   Procedure: ESOPHAGOGASTRODUODENOSCOPY (EGD) WITH PROPOFOL;  Surgeon: Jeani Hawking, MD;  Location: [facility];  Service: Endoscopy;  Laterality: N/A;   LUMBAR LAMINECTOMY/DECOMPRESSION MICRODISCECTOMY Left 10/24/2014   Procedure: COMPLETE LUMBAR DECOMPRESSION L4-L5 CENTRAL AND MICRODISCECTOMY L4-L5 LEFT;  Surgeon: Jacki Cones, MD;  Location: [facility];  Service:  Orthopedics;  Laterality: Left;   POLYPECTOMY  07/26/2021   Procedure: POLYPECTOMY;  Surgeon: Jeani Hawking, MD;  Location: [facility];  Service: Endoscopy;;   VAGINAL HYSTERECTOMY     Social History   Social History Narrative   ** Merged History Encounter **       Immunization History  Administered Date(s) Administered   Influenza,inj,Quad PF,6+ Mos 10/25/2014, 10/17/2015   Influenza-Unspecified 09/01/2022, 08/26/2023   PFIZER(Purple Top)SARS-COV-2 Vaccination 01/02/2020, 01/25/2020   Tdap 01/28/2012   Zoster  Recombinant(Shingrix) 09/08/2022     Objective: Vital Signs: There were no vitals taken for this visit.   Physical Exam   Musculoskeletal Exam: ***  CDAI Exam: CDAI Score: -- Patient Global: --; Provider Global: -- Swollen: --; Tender: -- Joint Exam 01/25/2024   No joint exam has been documented for this visit   There is currently no information documented on the homunculus. Go to the Rheumatology activity and complete the homunculus joint exam.  Investigation: No additional findings.  Imaging: No results found.  Recent Labs: Lab Results  Component Value Date   WBC 5.2 03/09/2023   HGB 12.8 03/09/2023   PLT 274.0 03/09/2023   NA 139 03/09/2023   K 3.9 03/09/2023   CL 103 03/09/2023   CO2 27 03/09/2023   GLUCOSE 103 (H) 03/09/2023   BUN 17 03/09/2023   CREATININE 0.61 03/09/2023   BILITOT 0.6 03/09/2023   ALKPHOS 91 03/09/2023   AST 14 03/09/2023   ALT 11 03/09/2023   PROT 7.3 03/09/2023   ALBUMIN 4.1 03/09/2023   CALCIUM 9.3 03/09/2023   GFRAA 124 06/04/2020   IMPRESSION: 1. Lower lumbar spine degeneration especially affecting facets with L4-5 anterolisthesis. 2. L4-5 right subarticular recess narrowing with impingement of the right L5 nerve root, also seen in 2018. 3. Noncompressive right foraminal narrowing at L4-5 and L5-S1.  Electronically Signed   By: Tiburcio Pea M.D.   On: 09/18/2023 07:02  IMPRESSION: Mild to moderate tricompartmental osteoarthritis, most prominent in the medial tibiofemoral compartment.  Electronically Signed   By: Narda Rutherford M.D.   On: 09/23/2022 12:47   Speciality Comments: No specialty comments available.  Procedures:  No procedures performed Allergies: Patient has no known allergies.   Assessment / Plan:     Visit Diagnoses: Raynaud's syndrome with gangrene (HCC)  Primary osteoarthritis of left knee  Spinal stenosis, lumbar region, with neurogenic  claudication  Prediabetes  Dyslipidemia  Gastroesophageal reflux disease with esophagitis without hemorrhage  History of migraine headaches  Chronic vertigo  Other specified hypothyroidism  Vitamin D deficiency - September 17, 2021 for vitamin D 28.14  Body mass index (BMI) of 35.0-35.9 in adult  Orders: No orders of the defined types were placed in this encounter.  No orders of the defined types were placed in this encounter.   Face-to-face time spent with patient was *** minutes. Greater than 50% of time was spent in counseling and coordination of care.  Follow-Up Instructions: No follow-ups on file.   Pollyann Savoy, MD  Note - This record has been created using Animal nutritionist.  Chart creation errors have been sought, but may not always  have been located. Such creation errors do not reflect on  the standard of medical care.

## 2024-01-25 ENCOUNTER — Encounter: Payer: Self-pay | Admitting: Rheumatology

## 2024-01-25 DIAGNOSIS — Z8669 Personal history of other diseases of the nervous system and sense organs: Secondary | ICD-10-CM

## 2024-01-25 DIAGNOSIS — R7303 Prediabetes: Secondary | ICD-10-CM

## 2024-01-25 DIAGNOSIS — E038 Other specified hypothyroidism: Secondary | ICD-10-CM

## 2024-01-25 DIAGNOSIS — M47816 Spondylosis without myelopathy or radiculopathy, lumbar region: Secondary | ICD-10-CM

## 2024-01-25 DIAGNOSIS — K2100 Gastro-esophageal reflux disease with esophagitis, without bleeding: Secondary | ICD-10-CM

## 2024-01-25 DIAGNOSIS — R42 Dizziness and giddiness: Secondary | ICD-10-CM

## 2024-01-25 DIAGNOSIS — E559 Vitamin D deficiency, unspecified: Secondary | ICD-10-CM

## 2024-01-25 DIAGNOSIS — M1712 Unilateral primary osteoarthritis, left knee: Secondary | ICD-10-CM

## 2024-01-25 DIAGNOSIS — Z6835 Body mass index (BMI) 35.0-35.9, adult: Secondary | ICD-10-CM

## 2024-01-25 DIAGNOSIS — E785 Hyperlipidemia, unspecified: Secondary | ICD-10-CM

## 2024-01-25 DIAGNOSIS — I7301 Raynaud's syndrome with gangrene: Secondary | ICD-10-CM

## 2024-02-18 ENCOUNTER — Encounter: Payer: Managed Care, Other (non HMO) | Admitting: Rheumatology

## 2024-03-08 ENCOUNTER — Other Ambulatory Visit: Payer: Self-pay | Admitting: Endocrinology

## 2024-03-08 ENCOUNTER — Other Ambulatory Visit: Payer: Self-pay

## 2024-03-08 DIAGNOSIS — E039 Hypothyroidism, unspecified: Secondary | ICD-10-CM

## 2024-03-10 ENCOUNTER — Ambulatory Visit: Payer: Commercial Managed Care - HMO | Admitting: Dermatology

## 2024-03-14 ENCOUNTER — Other Ambulatory Visit: Payer: Managed Care, Other (non HMO)

## 2024-03-14 ENCOUNTER — Encounter: Payer: Self-pay | Admitting: Endocrinology

## 2024-03-14 LAB — TSH: TSH: 1.04 m[IU]/L (ref 0.40–4.50)

## 2024-03-14 LAB — T4, FREE: Free T4: 1.3 ng/dL (ref 0.8–1.8)

## 2024-03-21 ENCOUNTER — Encounter: Payer: Self-pay | Admitting: Endocrinology

## 2024-03-21 ENCOUNTER — Ambulatory Visit: Payer: Managed Care, Other (non HMO) | Admitting: Endocrinology

## 2024-03-21 DIAGNOSIS — E559 Vitamin D deficiency, unspecified: Secondary | ICD-10-CM

## 2024-03-21 DIAGNOSIS — E039 Hypothyroidism, unspecified: Secondary | ICD-10-CM

## 2024-03-21 NOTE — Progress Notes (Signed)
 Outpatient Endocrinology Note [name], MD  03/21/24  Patient's Name: Kendra Rodriguez    DOB: 1966-02-21    MRN: [id number]  REASON OF VISIT: Follow-up for hypothyroidism  PCP: Elvira Hammersmith, MD  HISTORY OF PRESENT ILLNESS:   Kendra Rodriguez is a 58 y.o. old female with past medical history as listed below is presented for a follow up of hypothyroidism and vitamin D  deficiency.    Pertinent Thyroid  History: Patient was diagnosed with hypothyroidism in May 2022, she was initially presented with subacute thyroiditis with transient hypothyroidism, develop hypothyroidism spontaneously and Synthroid /levothyroxine  was started.  Patient has prediabetes , used to be on metformin  since 2019, reports diarrhea and currently has not been taking.  Managed by primary care provider.   Interval history  Patient has been taking levothyroxine  50 mcg daily.  Reports compliance.  No hypo and hyperthyroid symptoms.  Recent thyroid  function test normal as follows.  She complains of fatigue discussed that it can be multifactorial.  She has been taking vitamin D  1000 international unit daily.  Patient is accompanied by daughter in the clinic today.  No other complaints today.   Latest Reference Range & Units 03/14/24 08:41  TSH 0.40 - 4.50 mIU/L 1.04  T4,Free(Direct) 0.8 - 1.8 ng/dL 1.3    REVIEW OF SYSTEMS:  As per history of present illness.   PAST MEDICAL HISTORY: Past Medical History:  Diagnosis Date   Acute sinusitis    Allergy    Anxiety    Depression    NO MEDICATIONS - DOING OK NOW   Dizziness    Ear ache    RIGHT --HX OF MULTIPLE EAR INFECTIONS AS A CHILD    GERD (gastroesophageal reflux disease)    Headache(784.0)    MIGRAINES   Leg pain, left    with swelling   Lumbar stenosis    PAIN BACK AND LEFT LEG AND NUMBNESS LEG AND FOOT   Peripheral vascular disease (HCC)    HX OF TREATMENT FOR VARICOSE VEINS RIGHT LEG   Thyroid  disease    TOLD BLOOD LEVELS SLIGHTLY ABNORMAL -  BUT NOT REQUIRED MEDICATION    PAST SURGICAL HISTORY: Past Surgical History:  Procedure Laterality Date   BACK SURGERY     bladder lift     AT SAME TIME OF HYSTERECTOMY   BRAVO PH STUDY N/A 07/26/2021   Procedure: BRAVO PH STUDY;  Surgeon: Alvis Jourdain, MD;  Location: [facility];  Service: Endoscopy;  Laterality: N/A;   CHOLECYSTECTOMY     ESOPHAGOGASTRODUODENOSCOPY (EGD) WITH PROPOFOL  N/A 07/26/2021   Procedure: ESOPHAGOGASTRODUODENOSCOPY (EGD) WITH PROPOFOL ;  Surgeon: Alvis Jourdain, MD;  Location: [facility];  Service: Endoscopy;  Laterality: N/A;   LUMBAR LAMINECTOMY/DECOMPRESSION MICRODISCECTOMY Left 10/24/2014   Procedure: COMPLETE LUMBAR DECOMPRESSION L4-L5 CENTRAL AND MICRODISCECTOMY L4-L5 LEFT;  Surgeon: Florencia Hunter, MD;  Location: [facility];  Service: Orthopedics;  Laterality: Left;   POLYPECTOMY  07/26/2021   Procedure: POLYPECTOMY;  Surgeon: Alvis Jourdain, MD;  Location: [facility];  Service: Endoscopy;;   VAGINAL HYSTERECTOMY      ALLERGIES: No Known Allergies  FAMILY HISTORY:  Family History  Problem Relation Age of Onset   Hypertension Mother    Arthritis Mother    Diabetes Mother    Hyperlipidemia Mother    Arthritis Sister    Rheum arthritis Brother    Autoimmune disease Brother    Lung disease Brother    Arthritis Sister    Gout Son    High Cholesterol Daughter  Thyroid  disease Neg Hx     SOCIAL HISTORY: Social History   Socioeconomic History   Marital status: Married    Spouse name: Not on file   Number of children: Not on file   Years of education: Not on file   Highest education level: 6th grade  Occupational History   Not on file  Tobacco Use   Smoking status: Never   Smokeless tobacco: Never  Vaping Use   Vaping status: Never Used  Substance and Sexual Activity   Alcohol use: No   Drug use: Never   Sexual activity: Yes  Other Topics Concern   Not on file  Social History Narrative   ** Merged History Encounter **        Social Drivers of Health   Financial Resource Strain: Low Risk  (09/18/2023)   Overall Financial Resource Strain (CARDIA)    Difficulty of Paying Living Expenses: Not very hard  Food Insecurity: No Food Insecurity (09/18/2023)   Hunger Vital Sign    Worried About Running Out of Food in the Last Year: Never true    Ran Out of Food in the Last Year: Never true  Transportation Needs: No Transportation Needs (09/18/2023)   PRAPARE - Administrator, Civil Service (Medical): No    Lack of Transportation (Non-Medical): No  Physical Activity: Insufficiently Active (09/18/2023)   Exercise Vital Sign    Days of Exercise per Week: 3 days    Minutes of Exercise per Session: 20 min  Stress: No Stress Concern Present (09/18/2023)   [employer] of Occupational Health - Occupational Stress Questionnaire    Feeling of Stress : Only a little  Social Connections: Unknown (09/18/2023)   Social Connection and Isolation Panel [NHANES]    Frequency of Communication with Friends and Family: More than three times a week    Frequency of Social Gatherings with Friends and Family: More than three times a week    Attends Religious Services: More than 4 times per year    Active Member of [employer] or Organizations: No    Attends Engineer, structural: Not on file    Marital Status: Not on file    MEDICATIONS:  Current Outpatient Medications  Medication Sig Dispense Refill   ALPRAZolam  (XANAX ) 0.5 MG tablet Take 1 tablet (0.5 mg total) by mouth daily as needed for anxiety. 20 tablet 1   atorvastatin  (LIPITOR) 40 MG tablet TAKE 1 TABLET(40 MG) BY MOUTH DAILY 90 tablet 3   azithromycin  (ZITHROMAX ) 250 MG tablet Sig as indicated 6 tablet 0   benzonatate  (TESSALON ) 200 MG capsule Take 1 capsule (200 mg total) by mouth 3 (three) times daily as needed. 20 capsule 0   cetirizine  (ZYRTEC  ALLERGY) 10 MG tablet Take 1 tablet (10 mg total) by mouth daily. 30 tablet 0   cyclobenzaprine  (FLEXERIL )  10 MG tablet Take 1 tablet (10 mg total) by mouth 2 (two) times daily as needed for muscle spasms. 20 tablet 0   dextromethorphan-guaiFENesin  (MUCINEX  DM) 30-600 MG 12hr tablet Take 1 tablet by mouth 2 (two) times daily. 12 tablet 1   diazepam  (VALIUM ) 5 MG tablet Take 1 tablet (5 mg total) by mouth once as needed for up to 1 dose (prior to MRI for anxiety). 1 tablet 0   famotidine  (PEPCID ) 20 MG tablet Take 1 tablet (20 mg total) by mouth 2 (two) times daily. 30 tablet 0   fluticasone  (FLONASE ) 50 MCG/ACT nasal spray SHAKE LIQUID AND USE 1 SPRAY  IN EACH NOSTRIL DAILY 16 g 2   HYDROcodone -acetaminophen  (NORCO) 5-325 MG tablet Take 1 tablet by mouth every 6 (six) hours as needed for moderate pain (pain score 4-6). 15 tablet 0   ipratropium (ATROVENT ) 0.03 % nasal spray Place 2 sprays into both nostrils every 12 (twelve) hours. 30 mL 0   levothyroxine  (SYNTHROID ) 50 MCG tablet TAKE 1 TABLET(50 MCG) BY MOUTH DAILY 90 tablet 3   metFORMIN  (GLUCOPHAGE -XR) 500 MG 24 hr tablet Take 1 tablet (500 mg total) by mouth 2 (two) times daily with a meal. 120 tablet 3   Olopatadine  HCl 0.2 % SOLN Apply 1 drop to eye daily as needed. 2.5 mL 3   pantoprazole  (PROTONIX ) 40 MG tablet Take 40 mg by mouth daily before breakfast.     predniSONE  (DELTASONE ) 50 MG tablet Take 1 tab daily with breakfast for 3 days 3 tablet 0   pregabalin  (LYRICA ) 75 MG capsule Take 1 capsule (75 mg total) by mouth 2 (two) times daily. 84 capsule 0   trimethoprim -polymyxin b  (POLYTRIM ) ophthalmic solution Place 1 drop into the left eye every 6 (six) hours. 10 mL 0   No current facility-administered medications for this visit.    PHYSICAL EXAM: There were no vitals filed for this visit.  There is no height or weight on file to calculate BMI.  Wt Readings from Last 3 Encounters:  11/10/23 193 lb (87.5 kg)  09/18/23 191 lb (86.6 kg)  09/18/23 190 lb (86.2 kg)    General: Well developed, well nourished female in no apparent distress.   HEENT: AT/[place], no external lesions. Hearing intact to the spoken word Eyes: Conjunctiva clear and no icterus. Neck: Trachea midline, neck supple without appreciable thyromegaly or lymphadenopathy and no palpable thyroid  nodules Lungs: Clear to auscultation, no wheeze. Respirations not labored Heart: S1S2, Regular in rate and rhythm.  Abdomen: Soft, non tender, non distended, no masses, no striae Neurologic: Alert, oriented, normal speech, deep tendon biceps reflexes normal,  no gross focal neurological deficit Extremities: No pedal pitting edema, no tremors of outstretched hands Skin: Warm, color good.  Psychiatric: Does not appear depressed or anxious  PERTINENT HISTORIC LABORATORY AND IMAGING STUDIES:  All pertinent laboratory results were reviewed. Please see HPI also for further details.   TSH  Date Value Ref Range Status  03/14/2024 1.04 0.40 - 4.50 mIU/L Final  09/18/2023 1.24 0.35 - 5.50 uIU/mL Final  03/09/2023 1.18 0.35 - 5.50 uIU/mL Final     ASSESSMENT / PLAN  1. Primary hypothyroidism   2. Vitamin D  deficiency     -Patient was diagnosed with hypothyroidism in 2022.  Currently taking levothyroxine  50 mcg daily.   -She has history of vitamin D  deficiency, currently taking vitamin D  1000 international unit daily.    Plan: -Continue current dose of levothyroxine  50 mcg daily. -Continue vitamin D  supplement 1000 international unit daily. -Labs prior to follow-up visit in 6 months.   Diagnoses and all orders for this visit:  Primary hypothyroidism -     T4, free -     TSH  Vitamin D  deficiency -     VITAMIN D  25 Hydroxy (Vit-D Deficiency, Fractures)     DISPOSITION Follow up in clinic in 6 months suggested.  All questions answered and patient verbalized understanding of the plan.  [place] Calaya Gildner, MD [facility] Endocrinology [facility] [address], [address] [place], [place] [address] Phone # [phone]  At least part of this note was  generated using voice  recognition software. Inadvertent word errors may have occurred, which were not recognized during the proofreading process.

## 2024-03-22 ENCOUNTER — Ambulatory Visit: Payer: Managed Care, Other (non HMO) | Admitting: Rheumatology

## 2024-04-01 ENCOUNTER — Other Ambulatory Visit: Payer: Self-pay | Admitting: Emergency Medicine

## 2024-04-01 DIAGNOSIS — E785 Hyperlipidemia, unspecified: Secondary | ICD-10-CM

## 2024-05-10 ENCOUNTER — Encounter: Payer: Self-pay | Admitting: Emergency Medicine

## 2024-05-10 ENCOUNTER — Ambulatory Visit (INDEPENDENT_AMBULATORY_CARE_PROVIDER_SITE_OTHER): Admitting: Emergency Medicine

## 2024-05-10 ENCOUNTER — Ambulatory Visit: Admitting: Emergency Medicine

## 2024-05-10 VITALS — BP 128/90 | HR 84 | Temp 98.5°F | Ht 61.0 in | Wt 194.0 lb

## 2024-05-10 DIAGNOSIS — J22 Unspecified acute lower respiratory infection: Secondary | ICD-10-CM | POA: Diagnosis not present

## 2024-05-10 DIAGNOSIS — R6889 Other general symptoms and signs: Secondary | ICD-10-CM

## 2024-05-10 LAB — POCT INFLUENZA A/B
Influenza A, POC: NEGATIVE
Influenza B, POC: NEGATIVE

## 2024-05-10 LAB — POC COVID19 BINAXNOW: SARS Coronavirus 2 Ag: NEGATIVE

## 2024-05-10 MED ORDER — AZITHROMYCIN 250 MG PO TABS: ORAL_TABLET | ORAL | 0 refills | Status: DC

## 2024-05-10 MED ORDER — FLUTICASONE PROPIONATE 50 MCG/ACT NA SUSP: 2.0000 | Freq: Every day | NASAL | 6 refills | Status: DC

## 2024-05-10 NOTE — Patient Instructions (Signed)
 Infeccin de las vas respiratorias superiores en adultos Upper Respiratory Infection, Adult Una infeccin de las vas respiratorias superiores (IVRS) afecta la nariz, la garganta y las vas respiratorias superiores que llegan a los pulmones. El tipo ms comn de IVRS suele conocerse como el resfro comn. Las IVRS generalmente mejoran solas, sin tratamiento mdico. Cules son las causas? La causa de las IVRS es un germen (virus). Puede contraer estos grmenes de las siguientes maneras: Al aspirar las gotitas que una persona infectada elimina al toser o Engineering geologist. Al tocar algo que tiene el germen (est contaminado) y luego tocarse la boca, la nariz o los ojos. Qu incrementa el riesgo? Es ms propenso a Health and safety inspector IVRS si: Es muy pequeo o de edad muy [place]. Tiene contacto cercano con otros, como en el [place], la escuela o un centro de atencin mdica. Fuma. Tiene una enfermedad cardaca o pulmonar a largo plazo (crnica). Tiene debilitado el sistema encargado de combatir las enfermedades (sistema inmunitario). Tiene asma o alergias nasales. Tiene mucho estrs. Tiene un dficit nutricional. Cules son los signos o sntomas? Secrecin nasal o nariz tapada (congestin). Tos. Estornudos. Dolor de Advertising copywriter. Dolor de [place]. Sensacin de cansancio (fatiga). Grant Ruts. No querer comer tanto como lo hace habitualmente. Dolor en la frente, detrs de los ojos y por encima de los pmulos (dolor sinusal). Dolores musculares. Enrojecimiento o irritacin de los ojos. Presin en los odos o la cara. Cmo se trata? Las IVRS generalmente mejoran por s solas en un perodo de entre 7 y [address]. Los medicamentos no curan las IVRS, Biomedical engineer el mdico puede recomendarle ciertos medicamentos para ayudar a [employer], como por ejemplo: Medicamentos para la tos de [place]. Medicamentos para reducir la tos (antitusivos). La tos es un tipo de defensa contra las infecciones que ayuda a  Museum/gallery conservator la nariz, la garganta, la trquea y los pulmones (el sistema respiratorio). Tome estos medicamentos solamente como se lo haya indicado el mdico. Medicamentos para bajar la [place]. Siga estas instrucciones en su casa: Actividad Descanse todo lo que sea necesario. Si tiene fiebre, [employer], sin ir al Aleen Campi o a la escuela, hasta que ya no tenga fiebre, o hasta que el mdico le indique que puede regresar al Aleen Campi o a la escuela. Debe permanecer en su casa hasta que ya no pueda propagar (contagiar) la infeccin. Es posible que el mdico le indique que use una mascarilla para tener menos riesgo de propagar la infeccin. Para aliviar los sntomas Enjuguese la boca frecuentemente con una mezcla de agua con sal. Para preparar agua con sal, disuelva de  a 1 cucharadita (de 3 a 6 g) de sal en 1 taza (237 ml) de agua tibia. Use un humidificador de aire fro para agregar humedad al aire. Esto puede ayudarlo a que respire mejor. Comida y bebida  Beba suficiente lquido para mantener la orina de color amarillo plido. Tome sopas y caldos transparentes. Instrucciones generales  Use los medicamentos de venta libre y los recetados solamente como se lo haya indicado el mdico. No fume ni consuma ningn producto que contenga nicotina o tabaco. Si necesita ayuda para dejar de fumar, consulte al mdico. Evite estar cerca de personas que fuman (evite el humo ambiental de tabaco). Mantngase al da con todas las vacunas (inmunizaciones) y aplquese la vacuna contra la gripe todos los [place]. Concurra a todas las visitas de seguimiento. Cmo evitar contagiar la infeccin a otros  Lvese las manos con agua y jabn durante al menos 20  segundos. Use un desinfectante para manos si no dispone de [place] y [place]. Evite tocarse la boca, la cara, los ojos o la [place]. Tosa o estornude en un pauelo de papel o sobre su manga o codo. No tosa o estornude al aire ni se cubra la boca o la nariz con la  [place]. Comunquese con un mdico si: Siente que empeora o que no mejora. Tiene alguno de estos sntomas: Grant Ruts o escalofros. Mucosidad color marrn o roja en la nariz. Lquido amarillento o amarronado (Doctor, hospital de la Clinical cytogeneticist. Dolor en la cara, especialmente al inclinarse hacia adelante. Ganglios del cuello inflamados. Dolor al tragar. Zonas blancas en la parte de atrs de la garganta. Solicite ayuda de inmediato si: La falta de aire empeora. Los siguientes sntomas son muy intensos o constantes: Dolor de [place]. Dolor de odo. Dolor en la frente, detrs de los ojos y por encima de los pmulos (dolor sinusal). Dolor de pecho. Tiene una enfermedad pulmonar prolongada (crnica) junto con cualquiera de estos sntomas: Emitir sonidos de silbidos agudos al respirar, ms a menudo al exhalar (sibilancias). Tos prolongada (ms de [address]). Tos con sangre. Cambio en la mucosidad habitual. Tiene rigidez en el cuello. Tiene cambios en: La visin. La audicin. El razonamiento. El [place] de nimo. Estos sntomas pueden Customer service manager. Solicite ayuda de inmediato. Llame al 911. No espere a ver si los sntomas desaparecen. No conduzca por sus propios medios [facility]. Resumen Una infeccin de las vas respiratorias superiores (IVRS) es causada por un germen (virus). El tipo ms comn de IVRS suele conocerse como el resfro comn. Una IVRS suele mejorar en el transcurso de 7 a 10 das. Use los medicamentos de venta libre y los recetados solamente como se lo haya indicado el mdico. Esta informacin no tiene Theme park manager el consejo del mdico. Asegrese de hacerle al mdico cualquier pregunta que tenga. Document Revised: 06/17/2021 Document Reviewed: 06/17/2021 Elsevier Patient Education  2024 [employer].

## 2024-05-10 NOTE — Assessment & Plan Note (Signed)
 Symptom management discussed Advised to take Tylenol  and or Advil  as needed for pain Recommend over-the-counter Mucinex  DM Advised to rest and stay well-hydrated

## 2024-05-10 NOTE — Assessment & Plan Note (Signed)
 Viral upper respiratory infection with secondary bacterial infection Symptoms progressively getting worse.  Recommend daily azithromycin for the next 5 days Clinically stable.  No signs of pneumonia. Advised to rest and stay well-hydrated. Symptom management discussed. Advised to contact the office if no better or worse during the next several days

## 2024-05-10 NOTE — Progress Notes (Signed)
 Kendra Rodriguez 58 y.o.   Chief Complaint  Patient presents with   Sore Throat    Patient here for sore throat, itchy nose, headaches, cough, yellow mucus, hurts to swallow. Patient states started last Friday. She is only taking allergie medication for the symptoms and it is not helping.    HISTORY OF PRESENT ILLNESS: This is a 58 y.o. female complaining of flulike symptoms that started last week progressively getting worse. Complaining of sore throat, productive cough, headache, sinus congestion. No other complaints or medical concerns today.  Sore Throat  Associated symptoms include congestion, coughing and headaches. Pertinent negatives include no abdominal pain, diarrhea or vomiting.     Prior to Admission medications   Medication Sig Start Date End Date Taking? Authorizing Provider  atorvastatin  (LIPITOR) 40 MG tablet TAKE 1 TABLET(40 MG) BY MOUTH DAILY 04/01/24  Yes Johneisha Broaden, Emil Schanz, MD  cetirizine  (ZYRTEC  ALLERGY) 10 MG tablet Take 1 tablet (10 mg total) by mouth daily. 02/04/23  Yes Christopher Savannah, PA-C  famotidine  (PEPCID ) 20 MG tablet Take 1 tablet (20 mg total) by mouth 2 (two) times daily. 02/04/23  Yes Christopher Savannah, PA-C  levothyroxine  (SYNTHROID ) 50 MCG tablet TAKE 1 TABLET(50 MCG) BY MOUTH DAILY 10/06/23  Yes Thapa, [place], MD  pantoprazole  (PROTONIX ) 40 MG tablet Take 40 mg by mouth daily before breakfast.   Yes [provider]  ALPRAZolam  (XANAX ) 0.5 MG tablet Take 1 tablet (0.5 mg total) by mouth daily as needed for anxiety. 12/31/22   Purcell Emil Schanz, MD  azithromycin  (ZITHROMAX ) 250 MG tablet Sig as indicated 09/02/23   Purcell Emil Schanz, MD  benzonatate  (TESSALON ) 200 MG capsule Take 1 capsule (200 mg total) by mouth 3 (three) times daily as needed. 11/08/23   Allwardt, Alyssa M, PA-C  cyclobenzaprine  (FLEXERIL ) 10 MG tablet Take 1 tablet (10 mg total) by mouth 2 (two) times daily as needed for muscle spasms. 05/18/23   Zelaya, Oscar A, PA-C   dextromethorphan-guaiFENesin  (MUCINEX  DM) 30-600 MG 12hr tablet Take 1 tablet by mouth 2 (two) times daily. 09/02/23   Purcell Emil Schanz, MD  diazepam  (VALIUM ) 5 MG tablet Take 1 tablet (5 mg total) by mouth once as needed for up to 1 dose (prior to MRI for anxiety). 08/13/23   Georgina Ozell LABOR, MD  fluticasone  (FLONASE ) 50 MCG/ACT nasal spray SHAKE LIQUID AND USE 1 SPRAY IN EACH NOSTRIL DAILY Patient not taking: Reported on 05/10/2024 04/09/23   Purcell Emil Schanz, MD  HYDROcodone -acetaminophen  (NORCO) 5-325 MG tablet Take 1 tablet by mouth every 6 (six) hours as needed for moderate pain (pain score 4-6). 11/10/23   Gustabo Gordillo Jose, MD  ipratropium (ATROVENT ) 0.03 % nasal spray Place 2 sprays into both nostrils every 12 (twelve) hours. 06/22/23   Mayer, Jodi R, NP  metFORMIN  (GLUCOPHAGE -XR) 500 MG 24 hr tablet Take 1 tablet (500 mg total) by mouth 2 (two) times daily with a meal. Patient not taking: Reported on 05/10/2024 05/21/23   Motwani, Komal, MD  Olopatadine  HCl 0.2 % SOLN Apply 1 drop to eye daily as needed. Patient not taking: Reported on 05/10/2024 02/04/23   Christopher Savannah, PA-C  predniSONE  (DELTASONE ) 50 MG tablet Take 1 tab daily with breakfast for 3 days 07/14/23   Stuart Vernell Norris, PA-C  pregabalin  (LYRICA ) 75 MG capsule Take 1 capsule (75 mg total) by mouth 2 (two) times daily. 07/02/23 08/13/23  Georgina Ozell LABOR, MD  trimethoprim -polymyxin b  (POLYTRIM ) ophthalmic solution Place 1 drop into the left eye  every 6 (six) hours. Patient not taking: Reported on 05/10/2024 07/14/23   Stuart Vernell Norris, PA-C    No Known Allergies  Patient Active Problem List   Diagnosis Date Noted   Influenza A 11/10/2023   Acute cough 11/10/2023   Raynaud's syndrome with gangrene (HCC) 09/15/2023   Flu-like symptoms 09/02/2023   Chronic pain of left knee 07/30/2023   Dysuria 03/09/2023   Chronic pelvic pain in female 03/09/2023   Chronic bilateral low back pain without sciatica 03/09/2023    Primary osteoarthritis of left knee 09/08/2022   Vitamin D  deficiency 09/08/2022   Hypothyroidism 11/29/2021   Body mass index (BMI) of 35.0-35.9 in adult 06/04/2020   History of migraine headaches 12/06/2019   Dyslipidemia 06/30/2019   GERD with esophagitis 06/30/2019   Chronic vertigo 04/13/2018   Prediabetes 10/17/2015   Spinal stenosis, lumbar region, with neurogenic claudication 10/24/2014    Past Medical History:  Diagnosis Date   Acute sinusitis    Allergy    Anxiety    Depression    NO MEDICATIONS - DOING OK NOW   Dizziness    Ear ache    RIGHT --HX OF MULTIPLE EAR INFECTIONS AS A CHILD    GERD (gastroesophageal reflux disease)    Headache(784.0)    MIGRAINES   Leg pain, left    with swelling   Lumbar stenosis    PAIN BACK AND LEFT LEG AND NUMBNESS LEG AND FOOT   Peripheral vascular disease (HCC)    HX OF TREATMENT FOR VARICOSE VEINS RIGHT LEG   Thyroid  disease    TOLD BLOOD LEVELS SLIGHTLY ABNORMAL - BUT NOT REQUIRED MEDICATION    Past Surgical History:  Procedure Laterality Date   BACK SURGERY     bladder lift     AT SAME TIME OF HYSTERECTOMY   BRAVO PH STUDY N/A 07/26/2021   Procedure: BRAVO PH STUDY;  Surgeon: Rollin Dover, MD;  Location: [facility];  Service: Endoscopy;  Laterality: N/A;   CHOLECYSTECTOMY     ESOPHAGOGASTRODUODENOSCOPY (EGD) WITH PROPOFOL  N/A 07/26/2021   Procedure: ESOPHAGOGASTRODUODENOSCOPY (EGD) WITH PROPOFOL ;  Surgeon: Rollin Dover, MD;  Location: [facility];  Service: Endoscopy;  Laterality: N/A;   LUMBAR LAMINECTOMY/DECOMPRESSION MICRODISCECTOMY Left 10/24/2014   Procedure: COMPLETE LUMBAR DECOMPRESSION L4-L5 CENTRAL AND MICRODISCECTOMY L4-L5 LEFT;  Surgeon: Tanda DELENA Heading, MD;  Location: [facility];  Service: Orthopedics;  Laterality: Left;   POLYPECTOMY  07/26/2021   Procedure: POLYPECTOMY;  Surgeon: Rollin Dover, MD;  Location: [facility];  Service: Endoscopy;;   VAGINAL HYSTERECTOMY      Social History    Socioeconomic History   Marital status: Married    Spouse name: Not on file   Number of children: Not on file   Years of education: Not on file   Highest education level: 6th grade  Occupational History   Not on file  Tobacco Use   Smoking status: Never   Smokeless tobacco: Never  Vaping Use   Vaping status: Never Used  Substance and Sexual Activity   Alcohol use: No   Drug use: Never   Sexual activity: Yes  Other Topics Concern   Not on file  Social History Narrative   ** Merged History Encounter **       Social Drivers of Health   Financial Resource Strain: Low Risk  (09/18/2023)   Overall Financial Resource Strain (CARDIA)    Difficulty of Paying Living Expenses: Not very hard  Food Insecurity: No Food Insecurity (09/18/2023)   Hunger Vital  Sign    Worried About Programme researcher, broadcasting/film/video in the Last Year: Never true    Ran Out of Food in the Last Year: Never true  Transportation Needs: No Transportation Needs (09/18/2023)   PRAPARE - Administrator, Civil Service (Medical): No    Lack of Transportation (Non-Medical): No  Physical Activity: Insufficiently Active (09/18/2023)   Exercise Vital Sign    Days of Exercise per Week: 3 days    Minutes of Exercise per Session: 20 min  Stress: No Stress Concern Present (09/18/2023)   [employer] of Occupational Health - Occupational Stress Questionnaire    Feeling of Stress : Only a little  Social Connections: Unknown (09/18/2023)   Social Connection and Isolation Panel    Frequency of Communication with Friends and Family: More than three times a week    Frequency of Social Gatherings with Friends and Family: More than three times a week    Attends Religious Services: More than 4 times per year    Active Member of [employer] or Organizations: No    Attends Engineer, structural: Not on file    Marital Status: Not on file  Intimate Partner Violence: Not on file    Family History  Problem Relation Age  of Onset   Hypertension Mother    Arthritis Mother    Diabetes Mother    Hyperlipidemia Mother    Arthritis Sister    Rheum arthritis Brother    Autoimmune disease Brother    Lung disease Brother    Arthritis Sister    Gout Son    High Cholesterol Daughter    Thyroid  disease Neg Hx      Review of Systems  Constitutional: Negative.  Negative for chills and fever.  HENT:  Positive for congestion and sore throat.   Respiratory:  Positive for cough.   Cardiovascular: Negative.  Negative for chest pain and palpitations.  Gastrointestinal:  Negative for abdominal pain, diarrhea, nausea and vomiting.  Genitourinary: Negative.  Negative for dysuria.  Skin: Negative.  Negative for rash.  Neurological:  Positive for headaches.  All other systems reviewed and are negative.   Vitals:   05/10/24 1325  BP: (!) 128/90  Pulse: 84  Temp: 98.5 F (36.9 C)  SpO2: 98%    Physical Exam Constitutional:      Appearance: Normal appearance.  HENT:     Head: Normocephalic.     Mouth/Throat:     Mouth: Mucous membranes are moist.     Pharynx: Oropharynx is clear. Posterior oropharyngeal erythema present. No oropharyngeal exudate.  Eyes:     Extraocular Movements: Extraocular movements intact.     Conjunctiva/sclera: Conjunctivae normal.     Pupils: Pupils are equal, round, and reactive to light.  Cardiovascular:     Rate and Rhythm: Normal rate and regular rhythm.     Pulses: Normal pulses.     Heart sounds: Normal heart sounds.  Pulmonary:     Effort: Pulmonary effort is normal.     Breath sounds: Normal breath sounds.  Musculoskeletal:     Cervical back: No tenderness.  Lymphadenopathy:     Cervical: No cervical adenopathy.  Skin:    General: Skin is warm and dry.     Capillary Refill: Capillary refill takes less than 2 seconds.  Neurological:     General: No focal deficit present.     Mental Status: She is alert and oriented to person, place, and time.  Psychiatric:  Mood and Affect: Mood normal.        Behavior: Behavior normal.    Results for orders placed or performed in visit on 05/10/24 (from the past 24 hours)  POC COVID-19     Status: None   Collection Time: 05/10/24  1:51 PM  Result Value Ref Range   SARS Coronavirus 2 Ag Negative Negative  POCT Influenza A/B     Status: None   Collection Time: 05/10/24  1:51 PM  Result Value Ref Range   Influenza A, POC Negative Negative   Influenza B, POC Negative Negative     ASSESSMENT & PLAN: A total of 32 minutes was spent with the patient and counseling/coordination of care regarding preparing for this visit, review of most recent office visit notes, review of chronic medical conditions under management, review of all medications, diagnosis of lower respiratory infection and need for antibiotics, symptom management, prognosis, documentation and need for follow-up if no better or worse during the next several days.  Problem List Items Addressed This Visit       Respiratory   Lower respiratory infection - Primary   Viral upper respiratory infection with secondary bacterial infection Symptoms progressively getting worse.  Recommend daily azithromycin  for the next 5 days Clinically stable.  No signs of pneumonia. Advised to rest and stay well-hydrated. Symptom management discussed. Advised to contact the office if no better or worse during the next several days      Relevant Medications   azithromycin  (ZITHROMAX ) 250 MG tablet     Other   Flu-like symptoms   Symptom management discussed Advised to take Tylenol  and or Advil  as needed for pain Recommend over-the-counter Mucinex  DM Advised to rest and stay well-hydrated      Relevant Orders   POC COVID-19 (Completed)   POCT Influenza A/B (Completed)   Patient Instructions  Infeccin de las vas respiratorias superiores en adultos Upper Respiratory Infection, Adult Una infeccin de las vas respiratorias superiores (IVRS) afecta la nariz,  la garganta y las vas respiratorias superiores que llegan a los pulmones. El tipo ms comn de IVRS suele conocerse como el resfro comn. Las IVRS generalmente mejoran solas, sin tratamiento mdico. Cules son las causas? La causa de las IVRS es un germen (virus). Puede contraer estos grmenes de las siguientes maneras: Al aspirar las gotitas que una persona infectada elimina al toser o Engineering geologist. Al tocar algo que tiene el germen (est contaminado) y luego tocarse la boca, la nariz o los ojos. Qu incrementa el riesgo? Es ms propenso a Health and safety inspector IVRS si: Es muy pequeo o de edad muy [place]. Tiene contacto cercano con otros, como en el [place], la escuela o un centro de atencin mdica. Fuma. Tiene una enfermedad cardaca o pulmonar a largo plazo (crnica). Tiene debilitado el sistema encargado de combatir las enfermedades (sistema inmunitario). Tiene asma o alergias nasales. Tiene mucho estrs. Tiene un dficit nutricional. Cules son los signos o sntomas? Secrecin nasal o nariz tapada (congestin). Tos. Estornudos. Dolor de Advertising copywriter. Dolor de cabeza. Sensacin de cansancio (fatiga). [place]. No querer comer tanto como lo hace habitualmente. Dolor en la frente, detrs de los ojos y por encima de los pmulos (dolor sinusal). Dolores musculares. Enrojecimiento o irritacin de los ojos. Presin en los odos o la cara. Cmo se trata? Las IVRS generalmente mejoran por s solas en un perodo de entre 7 y [address]. Los medicamentos no curan las IVRS, pero el mdico puede recomendarle ciertos medicamentos para ayudar a [employer],  como por ejemplo: Medicamentos para la tos de [address]. Medicamentos para reducir la tos (antitusivos). La tos es un tipo de defensa contra las infecciones que ayuda a Museum/gallery conservator la nariz, la garganta, la trquea y los pulmones (el sistema respiratorio). Tome estos medicamentos solamente como se lo haya indicado el mdico. Medicamentos para  bajar la [place]. Siga estas instrucciones en su casa: Actividad Descanse todo lo que sea necesario. Si tiene fiebre, [employer], sin ir al veda o a la escuela, hasta que ya no tenga fiebre, o hasta que el mdico le indique que puede regresar al veda o a la escuela. Debe permanecer en su casa hasta que ya no pueda propagar (contagiar) la infeccin. Es posible que el mdico le indique que use una mascarilla para tener menos riesgo de propagar la infeccin. Para aliviar los sntomas Enjuguese la boca frecuentemente con una mezcla de agua con sal. Para preparar agua con sal, disuelva de  a 1 cucharadita (de 3 a 6 g) de sal en 1 taza (237 ml) de agua tibia. Use un humidificador de aire fro para agregar humedad al aire. Esto puede ayudarlo a que respire mejor. Comida y bebida  Beba suficiente lquido para mantener la orina de color amarillo plido. Tome sopas y caldos transparentes. Instrucciones generales  Use los medicamentos de venta libre y los recetados solamente como se lo haya indicado el mdico. No fume ni consuma ningn producto que contenga nicotina o tabaco. Si necesita ayuda para dejar de fumar, consulte al mdico. Evite estar cerca de personas que fuman (evite el humo ambiental de tabaco). Mantngase al da con todas las vacunas (inmunizaciones) y aplquese la vacuna contra la gripe todos los [place]. Concurra a todas las visitas de seguimiento. Cmo evitar contagiar la infeccin a otros  Lvese las manos con agua y jabn durante al menos 20 segundos. Use un desinfectante para manos si no dispone de agua y jabn. Evite tocarse la boca, la cara, los ojos o la [place]. Tosa o estornude en un pauelo de papel o sobre su manga o codo. No tosa o estornude al aire ni se cubra la boca o la nariz con la [place]. Comunquese con un mdico si: Siente que empeora o que no mejora. Tiene alguno de estos sntomas: [place] o escalofros. Mucosidad color marrn o roja en la  nariz. Lquido amarillento o amarronado (Doctor, hospital de la Clinical cytogeneticist. Dolor en la cara, especialmente al inclinarse hacia adelante. Ganglios del cuello inflamados. Dolor al tragar. Zonas blancas en la parte de atrs de la garganta. Solicite ayuda de inmediato si: La falta de aire empeora. Los siguientes sntomas son muy intensos o constantes: Dolor de cabeza. Dolor de odo. Dolor en la frente, detrs de los ojos y por encima de los pmulos (dolor sinusal). Dolor de pecho. Tiene una enfermedad pulmonar prolongada (crnica) junto con cualquiera de estos sntomas: Emitir sonidos de silbidos agudos al respirar, ms a menudo al exhalar (sibilancias). Tos prolongada (ms de [address] Tos con sangre. Cambio en la mucosidad habitual. Tiene rigidez en el cuello. Tiene cambios en: La visin. La audicin. El razonamiento. El [place] de nimo. Estos sntomas pueden Customer service manager. Solicite ayuda de inmediato. Llame al 911. No espere a ver si los sntomas desaparecen. No conduzca por sus propios medios [facility] hospital. Resumen Una infeccin de las vas respiratorias superiores (IVRS) es causada por un germen (virus). El tipo ms comn de IVRS suele conocerse como el resfro comn. Una IVRS suele mejorar  en el transcurso de 7 a 10 das. Use los medicamentos de venta libre y los recetados solamente como se lo haya indicado el mdico. Esta informacin no tiene Theme park manager el consejo del mdico. Asegrese de hacerle al mdico cualquier pregunta que tenga. Document Revised: 06/17/2021 Document Reviewed: 06/17/2021 Elsevier Patient Education  2024 [employer].    Emil Schaumann, MD [facility]

## 2024-05-11 ENCOUNTER — Other Ambulatory Visit: Payer: Self-pay | Admitting: Emergency Medicine

## 2024-05-11 ENCOUNTER — Encounter: Payer: Self-pay | Admitting: Emergency Medicine

## 2024-05-11 MED ORDER — HYDROCODONE BIT-HOMATROP MBR 5-1.5 MG/5ML PO SOLN: 5.0000 mL | Freq: Every evening | ORAL | 0 refills | Status: DC | PRN

## 2024-05-11 NOTE — Telephone Encounter (Signed)
 New prescription for Hycodan syrup sent to pharmacy of record today.  Thanks.

## 2024-05-16 ENCOUNTER — Ambulatory Visit (INDEPENDENT_AMBULATORY_CARE_PROVIDER_SITE_OTHER): Admitting: Emergency Medicine

## 2024-05-16 ENCOUNTER — Encounter: Payer: Self-pay | Admitting: Emergency Medicine

## 2024-05-16 VITALS — BP 118/88 | HR 77 | Temp 98.8°F | Ht 61.0 in | Wt 196.0 lb

## 2024-05-16 DIAGNOSIS — J329 Chronic sinusitis, unspecified: Secondary | ICD-10-CM | POA: Diagnosis not present

## 2024-05-16 DIAGNOSIS — R0981 Nasal congestion: Secondary | ICD-10-CM | POA: Insufficient documentation

## 2024-05-16 DIAGNOSIS — B9689 Other specified bacterial agents as the cause of diseases classified elsewhere: Secondary | ICD-10-CM | POA: Diagnosis not present

## 2024-05-16 MED ORDER — AMOXICILLIN-POT CLAVULANATE 875-125 MG PO TABS: 1.0000 | ORAL_TABLET | Freq: Two times a day (BID) | ORAL | 0 refills | Status: AC

## 2024-05-16 MED ORDER — PSEUDOEPHEDRINE-GUAIFENESIN ER 60-600 MG PO TB12
1.0000 | ORAL_TABLET | Freq: Two times a day (BID) | ORAL | 1 refills | Status: AC
Start: 2024-05-16 — End: 2024-05-21

## 2024-05-16 NOTE — Patient Instructions (Signed)
 Infeccin de los senos paranasales en los adultos Sinus Infection, Adult La infeccin de los senos paranasales es el dolor y la hinchazn (inflamacin) de los senos paranasales. Los senos paranasales son espacios vacos en los huesos alrededor del rostro. Estos se encuentran en los siguientes lugares: Alrededor de los ojos. En la mitad de la frente. Detrs de Architectural technologist. En los pmulos. Los senos paranasales y las fosas nasales estn cubiertos de un lquido llamado mucosidad. La mucosidad drena a travs de los senos paranasales. La hinchazn puede atrapar mucosidad en los senos paranasales. Esto permite que se desarrollen grmenes (bacterias, virus u hongos), lo que produce infecciones. La [employer], la causa de esta afeccin es un virus. Cules son las causas? Alergias. Asma. Grmenes. Objetos que obstruyen la nariz o los senos paranasales. Crecimientos en el interior de la nariz (plipos nasales). Sustancias qumicas o irritantes que estn presentes en el aire. Un hongo. Esto es poco frecuente. Qu incrementa el riesgo? Tener debilitado el sistema de defensa del cuerpo (sistema inmunitario). Nadar o bucear mucho. Usar aerosoles nasales con mucha frecuencia. Fumar. Cules son los signos o sntomas? Los principales sntomas de esta afeccin son dolor y sensacin de presin alrededor de los senos paranasales. Otros sntomas incluyen: Nariz tapada (congestin nasal). Esto puede dificultar la respiracin por la nariz. Goteo nasal (drenaje). Dolor, hinchazn y Airline pilot en los senos paranasales. Tos que puede empeorar por la noche. Incapacidad de sentir olores y sabores. Mucosidad que se acumula en la garganta o la parte posterior de la nariz (goteo posnasal). Esto puede causar dolor de garganta o mal aliento. Estar muy cansado (fatigado). Grant Ruts. Cmo se diagnostica? Los sntomas. Sus antecedentes mdicos. Un examen fsico. Pruebas para averiguar si la afeccin es de corta  duracin [place]) o de larga duracin (crnica). El mdico puede: Revisarle la nariz para detectar crecimientos (plipos). Revisarle los senos paranasales con una herramienta que tiene una luz en un extremo (endoscopio). Revisar si tiene alergias o grmenes. Hacerle pruebas de diagnstico por imgenes, como una resonancia magntica (RM) o exploracin por tomografa computarizada (TC). Cmo se trata? El tratamiento de esta afeccin depende de la causa y si es de corta o de larga duracin. Si la causa es un virus, los sntomas Teaching laboratory technician solos en el trmino de [address]. Pueden darle medicamentos para aliviar los sntomas. Incluyen lo siguiente: Medicamentos para Actuary tejido inflamado de la Clinical cytogeneticist. Un aerosol para tratar la hinchazn de las fosas nasales. Enjuagues que ayudan a eliminar la mucosidad espesa de la nariz (lavados con solucin salina nasal). Medicamentos para tratar alergias (antihistamnicos). Analgsicos de [employer]. Si la causa es una bacteria, es posible que el mdico espere para averiguar si usted mejora sin TEFL teacher. Es posible que le den un antibitico si usted tiene: Una infeccin grave. El sistema de defensa del organismo debilitado. Si la causa son crecimientos en la Darene Lamer, es posible que necesite [place]. Siga estas instrucciones en su casa: [employer], use o aplique los medicamentos de venta libre y los recetados solamente como se lo haya indicado el mdico. Estos pueden incluir aerosoles nasales. Si le recetaron un antibitico, tmelo como se lo haya indicado el mdico. No deje de tomarlo aunque comience a sentirse mejor. Hidrtese y humidifique los ambientes  Beba suficiente agua para Pharmacologist el pis (la orina) de color amarillo plido. Use un humidificador de vapor fro para mantener la humedad de su hogar por encima del 50 %. Inhale vapor durante 10 a 15 minutos,  de 3 a 4 veces al da, o como se lo haya indicado el mdico. Puede hacer  esto en el bao con el vapor del agua caliente de la ducha. Trate de no exponerse al aire fro o seco. Reposo Descanse todo lo posible. Duerma con la cabeza levantada (elevada). Asegrese de dormir lo suficiente cada noche. Instrucciones generales  Pngase un pao caliente y hmedo en el rostro 3 a 4 veces al da, o con la frecuencia indicada por el mdico. Hgase lavados con solucin salina nasal con la frecuencia que le haya indicado el mdico. Lvese las manos frecuentemente con agua y [place]. Use un desinfectante para manos si no dispone de [place] y [place]. No fume. Evite estar cerca de personas que fuman (fumador pasivo). Concurra a todas las visitas de seguimiento. Comunquese con un mdico si: Tiene fiebre. Sus sntomas empeoran. Los sntomas no mejoran en el perodo de [address]. Solicite ayuda de inmediato si: Tiene un dolor de cabeza muy intenso. No puede dejar de vomitar. Tiene dolor muy intenso o hinchazn en la zona del rostro o los ojos. Tiene dificultad para ver. Se siente confundido. Tiene el cuello rgido. Tiene dificultad para respirar. Estos sntomas pueden Customer service manager. Solicite ayuda de inmediato. Llame al 911. No espere a ver si los sntomas desaparecen. No conduzca por sus propios medios [employer]. Resumen La infeccin de los senos paranasales es la hinchazn de los senos paranasales. Los senos paranasales son espacios vacos en los huesos alrededor del rostro. La causa de esta afeccin es la inflamacin o hinchazn de los tejidos que estn en el interior de la [place]. Esto hace que los grmenes queden atrapados. Los grmenes pueden provocar infecciones. Si le recetaron un antibitico, tmelo como se lo haya indicado el mdico. No deje de tomarlo aunque comience a sentirse mejor. Concurra a todas las visitas de seguimiento. Esta informacin no tiene Theme park manager el consejo del mdico. Asegrese de hacerle al mdico cualquier pregunta que  tenga. Document Revised: 10/09/2021 Document Reviewed: 10/09/2021 Elsevier Patient Education  2024 [employer].

## 2024-05-16 NOTE — Progress Notes (Signed)
 Kendra Rodriguez 58 y.o.   Chief Complaint  Patient presents with   Cough    Patient states she is having throat pain, still coughing, and having a lot of mucus. She says the medications that were given at the last visit has not helped at all     HISTORY OF PRESENT ILLNESS: This is a 58 y.o. female here for follow-up of visit on 05/10/2024 when she presented with lower respiratory infection Finished azithromycin  for 5 days Still having sinus congestion with cough and nasal discharge No other complaints or medical concerns today.  Cough Pertinent negatives include no chest pain, chills, fever, headaches or shortness of breath.     Prior to Admission medications   Medication Sig Start Date End Date Taking? Authorizing Provider  ALPRAZolam  (XANAX ) 0.5 MG tablet Take 1 tablet (0.5 mg total) by mouth daily as needed for anxiety. 12/31/22  Yes Raydel Hosick, Emil Schanz, MD  atorvastatin  (LIPITOR) 40 MG tablet TAKE 1 TABLET(40 MG) BY MOUTH DAILY 04/01/24  Yes Mordecai Tindol, Emil Schanz, MD  benzonatate  (TESSALON ) 200 MG capsule Take 1 capsule (200 mg total) by mouth 3 (three) times daily as needed. 11/08/23  Yes Allwardt, Alyssa M, PA-C  cetirizine  (ZYRTEC  ALLERGY) 10 MG tablet Take 1 tablet (10 mg total) by mouth daily. 02/04/23  Yes Christopher Savannah, PA-C  cyclobenzaprine  (FLEXERIL ) 10 MG tablet Take 1 tablet (10 mg total) by mouth 2 (two) times daily as needed for muscle spasms. 05/18/23  Yes Zelaya, Oscar A, PA-C  diazepam  (VALIUM ) 5 MG tablet Take 1 tablet (5 mg total) by mouth once as needed for up to 1 dose (prior to MRI for anxiety). 08/13/23  Yes Georgina Ozell LABOR, MD  famotidine  (PEPCID ) 20 MG tablet Take 1 tablet (20 mg total) by mouth 2 (two) times daily. 02/04/23  Yes Christopher Savannah, PA-C  fluticasone  (FLONASE ) 50 MCG/ACT nasal spray Place 2 sprays into both nostrils daily. 05/10/24  Yes Tiena Manansala, Emil Schanz, MD  HYDROcodone  bit-homatropine [facility]) 5-1.5 MG/5ML syrup Take 5 mLs by mouth at bedtime as needed  for cough. 05/11/24  Yes Seba Madole Jose, MD  ipratropium (ATROVENT ) 0.03 % nasal spray Place 2 sprays into both nostrils every 12 (twelve) hours. 06/22/23  Yes Mayer, Jodi R, NP  levothyroxine  (SYNTHROID ) 50 MCG tablet TAKE 1 TABLET(50 MCG) BY MOUTH DAILY 10/06/23  Yes Thapa, [place], MD  pantoprazole  (PROTONIX ) 40 MG tablet Take 40 mg by mouth daily before breakfast.   Yes [provider]  pregabalin  (LYRICA ) 75 MG capsule Take 1 capsule (75 mg total) by mouth 2 (two) times daily. 07/02/23 05/16/24 Yes Georgina Ozell LABOR, MD  azithromycin  (ZITHROMAX ) 250 MG tablet Sig as indicated Patient not taking: Reported on 05/16/2024 09/02/23   Purcell Emil Schanz, MD  azithromycin  (ZITHROMAX ) 250 MG tablet Sig as indicated Patient not taking: Reported on 05/16/2024 05/10/24   Purcell Emil Schanz, MD  dextromethorphan-guaiFENesin  (MUCINEX  DM) 30-600 MG 12hr tablet Take 1 tablet by mouth 2 (two) times daily. Patient not taking: Reported on 05/16/2024 09/02/23   Purcell Emil Schanz, MD  fluticasone  (FLONASE ) 50 MCG/ACT nasal spray SHAKE LIQUID AND USE 1 SPRAY IN EACH NOSTRIL DAILY Patient not taking: Reported on 05/16/2024 04/09/23   Deanglo Hissong Jose, MD  metFORMIN  (GLUCOPHAGE -XR) 500 MG 24 hr tablet Take 1 tablet (500 mg total) by mouth 2 (two) times daily with a meal. Patient not taking: Reported on 05/16/2024 05/21/23   Motwani, Komal, MD  Olopatadine  HCl 0.2 % SOLN Apply 1 drop to  eye daily as needed. Patient not taking: Reported on 05/16/2024 02/04/23   Christopher Savannah, PA-C  predniSONE  (DELTASONE ) 50 MG tablet Take 1 tab daily with breakfast for 3 days Patient not taking: Reported on 05/16/2024 07/14/23   Stuart Vernell Norris, PA-C  trimethoprim -polymyxin b  (POLYTRIM ) ophthalmic solution Place 1 drop into the left eye every 6 (six) hours. Patient not taking: Reported on 05/16/2024 07/14/23   Stuart Vernell Norris, PA-C    No Known Allergies  Patient Active Problem List   Diagnosis Date Noted    Influenza A 11/10/2023   Acute cough 11/10/2023   Raynaud's syndrome with gangrene (HCC) 09/15/2023   Lower respiratory infection 09/02/2023   Flu-like symptoms 09/02/2023   Chronic pain of left knee 07/30/2023   Dysuria 03/09/2023   Chronic pelvic pain in female 03/09/2023   Chronic bilateral low back pain without sciatica 03/09/2023   Primary osteoarthritis of left knee 09/08/2022   Vitamin D  deficiency 09/08/2022   Hypothyroidism 11/29/2021   Body mass index (BMI) of 35.0-35.9 in adult 06/04/2020   History of migraine headaches 12/06/2019   Dyslipidemia 06/30/2019   GERD with esophagitis 06/30/2019   Chronic vertigo 04/13/2018   Prediabetes 10/17/2015   Spinal stenosis, lumbar region, with neurogenic claudication 10/24/2014    Past Medical History:  Diagnosis Date   Acute sinusitis    Allergy    Anxiety    Depression    NO MEDICATIONS - DOING OK NOW   Dizziness    Ear ache    RIGHT --HX OF MULTIPLE EAR INFECTIONS AS A CHILD    GERD (gastroesophageal reflux disease)    Headache(784.0)    MIGRAINES   Leg pain, left    with swelling   Lumbar stenosis    PAIN BACK AND LEFT LEG AND NUMBNESS LEG AND FOOT   Peripheral vascular disease (HCC)    HX OF TREATMENT FOR VARICOSE VEINS RIGHT LEG   Thyroid  disease    TOLD BLOOD LEVELS SLIGHTLY ABNORMAL - BUT NOT REQUIRED MEDICATION    Past Surgical History:  Procedure Laterality Date   BACK SURGERY     bladder lift     AT SAME TIME OF HYSTERECTOMY   BRAVO PH STUDY N/A 07/26/2021   Procedure: BRAVO PH STUDY;  Surgeon: Rollin Dover, MD;  Location: [facility];  Service: Endoscopy;  Laterality: N/A;   CHOLECYSTECTOMY     ESOPHAGOGASTRODUODENOSCOPY (EGD) WITH PROPOFOL  N/A 07/26/2021   Procedure: ESOPHAGOGASTRODUODENOSCOPY (EGD) WITH PROPOFOL ;  Surgeon: Rollin Dover, MD;  Location: [facility];  Service: Endoscopy;  Laterality: N/A;   LUMBAR LAMINECTOMY/DECOMPRESSION MICRODISCECTOMY Left 10/24/2014   Procedure: COMPLETE  LUMBAR DECOMPRESSION L4-L5 CENTRAL AND MICRODISCECTOMY L4-L5 LEFT;  Surgeon: Tanda DELENA Heading, MD;  Location: [facility];  Service: Orthopedics;  Laterality: Left;   POLYPECTOMY  07/26/2021   Procedure: POLYPECTOMY;  Surgeon: Rollin Dover, MD;  Location: [facility];  Service: Endoscopy;;   VAGINAL HYSTERECTOMY      Social History   Socioeconomic History   Marital status: Married    Spouse name: Not on file   Number of children: Not on file   Years of education: Not on file   Highest education level: 6th grade  Occupational History   Not on file  Tobacco Use   Smoking status: Never   Smokeless tobacco: Never  Vaping Use   Vaping status: Never Used  Substance and Sexual Activity   Alcohol use: No   Drug use: Never   Sexual activity: Yes  Other Topics Concern  Not on file  Social History Narrative   ** Merged History Encounter **       Social Drivers of Health   Financial Resource Strain: Low Risk  (09/18/2023)   Overall Financial Resource Strain (CARDIA)    Difficulty of Paying Living Expenses: Not very hard  Food Insecurity: No Food Insecurity (09/18/2023)   Hunger Vital Sign    Worried About Running Out of Food in the Last Year: Never true    Ran Out of Food in the Last Year: Never true  Transportation Needs: No Transportation Needs (09/18/2023)   PRAPARE - Administrator, Civil Service (Medical): No    Lack of Transportation (Non-Medical): No  Physical Activity: Insufficiently Active (09/18/2023)   Exercise Vital Sign    Days of Exercise per Week: 3 days    Minutes of Exercise per Session: 20 min  Stress: No Stress Concern Present (09/18/2023)   [employer] of Occupational Health - Occupational Stress Questionnaire    Feeling of Stress : Only a little  Social Connections: Unknown (09/18/2023)   Social Connection and Isolation Panel    Frequency of Communication with Friends and Family: More than three times a week    Frequency of Social  Gatherings with Friends and Family: More than three times a week    Attends Religious Services: More than 4 times per year    Active Member of [employer] or Organizations: No    Attends Engineer, structural: Not on file    Marital Status: Not on file  Intimate Partner Violence: Not on file    Family History  Problem Relation Age of Onset   Hypertension Mother    Arthritis Mother    Diabetes Mother    Hyperlipidemia Mother    Arthritis Sister    Rheum arthritis Brother    Autoimmune disease Brother    Lung disease Brother    Arthritis Sister    Gout Son    High Cholesterol Daughter    Thyroid  disease Neg Hx      Review of Systems  Constitutional:  Negative for chills and fever.  HENT:  Positive for congestion.   Respiratory:  Positive for cough. Negative for shortness of breath.   Cardiovascular: Negative.  Negative for chest pain and palpitations.  Gastrointestinal:  Negative for abdominal pain, nausea and vomiting.  Genitourinary: Negative.  Negative for dysuria and hematuria.  Neurological:  Negative for dizziness and headaches.    Vitals:   05/16/24 1458  BP: 118/88  Pulse: 77  Temp: 98.8 F (37.1 C)  SpO2: 97%    Physical Exam Vitals reviewed.  Constitutional:      Appearance: Normal appearance.  HENT:     Head: Normocephalic.     Nose: Congestion present.     Mouth/Throat:     Mouth: Mucous membranes are moist.     Pharynx: Oropharynx is clear.  Eyes:     Extraocular Movements: Extraocular movements intact.     Conjunctiva/sclera: Conjunctivae normal.     Pupils: Pupils are equal, round, and reactive to light.  Cardiovascular:     Rate and Rhythm: Normal rate and regular rhythm.     Pulses: Normal pulses.     Heart sounds: Normal heart sounds.  Pulmonary:     Effort: Pulmonary effort is normal.     Breath sounds: Normal breath sounds.  Musculoskeletal:     Cervical back: No tenderness.  Lymphadenopathy:     Cervical: No cervical adenopathy.  Skin:    General: Skin is warm and dry.  Neurological:     Mental Status: She is alert and oriented to person, place, and time.  Psychiatric:        Mood and Affect: Mood normal.        Behavior: Behavior normal.      ASSESSMENT & PLAN: A total of 33 minutes was spent with the patient and counseling/coordination of care regarding preparing for this visit, review of most recent office visit notes, review of all medications, diagnosis of bacterial sinusitis and need to restart antibiotic, symptom management, prognosis, ED precautions, documentation, and need for follow-up if no better or worse during the next several days.  Problem List Items Addressed This Visit       Respiratory   Bacterial sinusitis - Primary   Upper viral respiratory infection now with secondary bacterial infection Recommend Augmentin  875 mg twice a day for 7 days Symptom management discussed Advised to rest and stay well-hydrated Advised to contact the office if no better or worse during the next several days      Relevant Medications   amoxicillin -clavulanate (AUGMENTIN ) 875-125 MG tablet   pseudoephedrine -guaifenesin  (MUCINEX  D) 60-600 MG 12 hr tablet   Sinus congestion   Symptom management discussed Advised to continue Flonase  and use saline nasal sprays Nettie pot is recommended Recommend Mucinex  D twice a day Advised to rest and stay well-hydrated      Relevant Medications   pseudoephedrine -guaifenesin  (MUCINEX  D) 60-600 MG 12 hr tablet   Patient Instructions  Infeccin de los senos paranasales en los adultos Sinus Infection, Adult La infeccin de los senos paranasales es el dolor y la hinchazn (inflamacin) de los senos paranasales. Los senos paranasales son espacios vacos en los huesos alrededor del rostro. Estos se encuentran en los siguientes lugares: Alrededor de los ojos. En la mitad de la frente. Detrs de Architectural technologist. En los pmulos. Los senos paranasales y las fosas nasales estn  cubiertos de un lquido llamado mucosidad. La mucosidad drena a travs de los senos paranasales. La hinchazn puede atrapar mucosidad en los senos paranasales. Esto permite que se desarrollen grmenes (bacterias, virus u hongos), lo que produce infecciones. La [employer], la causa de esta afeccin es un virus. Cules son las causas? Alergias. Asma. Grmenes. Objetos que obstruyen la nariz o los senos paranasales. Crecimientos en el interior de la nariz (plipos nasales). Sustancias qumicas o irritantes que estn presentes en el aire. Un hongo. Esto es poco frecuente. Qu incrementa el riesgo? Tener debilitado el sistema de defensa del cuerpo (sistema inmunitario). Nadar o bucear mucho. Usar aerosoles nasales con mucha frecuencia. Fumar. Cules son los signos o sntomas? Los principales sntomas de esta afeccin son dolor y sensacin de presin alrededor de los senos paranasales. Otros sntomas incluyen: Nariz tapada (congestin nasal). Esto puede dificultar la respiracin por la nariz. Goteo nasal (drenaje). Dolor, hinchazn y Airline pilot en los senos paranasales. Tos que puede empeorar por la noche. Incapacidad de sentir olores y sabores. Mucosidad que se acumula en la garganta o la parte posterior de la nariz (goteo posnasal). Esto puede causar dolor de garganta o mal aliento. Estar muy cansado (fatigado). [place]. Cmo se diagnostica? Los sntomas. Sus antecedentes mdicos. Un examen fsico. Pruebas para averiguar si la afeccin es de corta duracin [place]) o de larga duracin (crnica). El mdico puede: Revisarle la nariz para detectar crecimientos (plipos). Revisarle los senos paranasales con una herramienta que tiene una luz en un extremo (endoscopio). Revisar si  tiene Environmental consultant o grmenes. Hacerle pruebas de diagnstico por imgenes, como una resonancia magntica (RM) o exploracin por tomografa computarizada (TC). Cmo se trata? El tratamiento de esta afeccin  depende de la causa y si es de corta o de larga duracin. Si la causa es un virus, los sntomas Teaching laboratory technician solos en el trmino de [address]. Pueden darle medicamentos para aliviar los sntomas. Incluyen lo siguiente: Medicamentos para Actuary tejido inflamado de la Clinical cytogeneticist. Un aerosol para tratar la hinchazn de las fosas nasales. Enjuagues que ayudan a eliminar la mucosidad espesa de la nariz (lavados con solucin salina nasal). Medicamentos para tratar alergias (antihistamnicos). Analgsicos de [employer]. Si la causa es una bacteria, es posible que el mdico espere para averiguar si usted mejora sin TEFL teacher. Es posible que le den un antibitico si usted tiene: Una infeccin grave. El sistema de defensa del organismo debilitado. Si la causa son crecimientos en la [place], es posible que necesite [place]. Siga estas instrucciones en su casa: [employer], use o aplique los medicamentos de venta libre y los recetados solamente como se lo haya indicado el mdico. Estos pueden incluir aerosoles nasales. Si le recetaron un antibitico, tmelo como se lo haya indicado el mdico. No deje de tomarlo aunque comience a sentirse mejor. Hidrtese y humidifique los ambientes  Beba suficiente agua para Pharmacologist el pis (la orina) de color amarillo plido. Use un humidificador de vapor fro para mantener la humedad de su hogar por encima del 50 %. Inhale vapor durante 10 a 15 minutos, de 3 a 4 veces al da, o como se lo haya indicado el mdico. Puede hacer esto en el bao con el vapor del agua caliente de la ducha. Trate de no exponerse al aire fro o seco. Reposo Descanse todo lo posible. Duerma con la cabeza levantada (elevada). Asegrese de dormir lo suficiente cada noche. Instrucciones generales  Pngase un pao caliente y hmedo en el rostro 3 a 4 veces al da, o con la frecuencia indicada por el mdico. Hgase lavados con solucin salina nasal con la frecuencia que le haya  indicado el mdico. Lvese las manos frecuentemente con agua y jabn. Use un desinfectante para manos si no dispone de agua y jabn. No fume. Evite estar cerca de personas que fuman (fumador pasivo). Concurra a todas las visitas de seguimiento. Comunquese con un mdico si: Tiene fiebre. Sus sntomas empeoran. Los sntomas no mejoran en el perodo de [address]. Solicite ayuda de inmediato si: Tiene un dolor de cabeza muy intenso. No puede dejar de vomitar. Tiene dolor muy intenso o hinchazn en la zona del rostro o los ojos. Tiene dificultad para ver. Se siente confundido. Tiene el cuello rgido. Tiene dificultad para respirar. Estos sntomas pueden Customer service manager. Solicite ayuda de inmediato. Llame al 911. No espere a ver si los sntomas desaparecen. No conduzca por sus propios medios [employer]. Resumen La infeccin de los senos paranasales es la hinchazn de los senos paranasales. Los senos paranasales son espacios vacos en los huesos alrededor del rostro. La causa de esta afeccin es la inflamacin o hinchazn de los tejidos que estn en el interior de la [place]. Esto hace que los grmenes queden atrapados. Los grmenes pueden provocar infecciones. Si le recetaron un antibitico, tmelo como se lo haya indicado el mdico. No deje de tomarlo aunque comience a sentirse mejor. Concurra a todas las visitas de seguimiento. Esta informacin no tiene Theme park manager el consejo del mdico. Asegrese de hacerle  al mdico cualquier pregunta que tenga. Document Revised: 10/09/2021 Document Reviewed: 10/09/2021 Elsevier Patient Education  2024 Elsevier Inc.    Emil Schaumann, MD  Primary Care at [facility]

## 2024-05-16 NOTE — Assessment & Plan Note (Signed)
 Upper viral respiratory infection now with secondary bacterial infection Recommend Augmentin  875 mg twice a day for 7 days Symptom management discussed Advised to rest and stay well-hydrated Advised to contact the office if no better or worse during the next several days

## 2024-05-16 NOTE — Assessment & Plan Note (Signed)
 Symptom management discussed Advised to continue Flonase  and use saline nasal sprays Nettie pot is recommended Recommend Mucinex  D twice a day Advised to rest and stay well-hydrated

## 2024-05-27 ENCOUNTER — Encounter: Payer: Self-pay | Admitting: Emergency Medicine

## 2024-05-27 MED ORDER — FLUCONAZOLE 150 MG PO TABS: 150.0000 mg | ORAL_TABLET | Freq: Once | ORAL | 0 refills | Status: AC

## 2024-05-30 ENCOUNTER — Ambulatory Visit
Admission: EM | Admit: 2024-05-30 | Discharge: 2024-05-30 | Disposition: A | Discharge status: Home / Self Care | Attending: Family Medicine | Admitting: Family Medicine

## 2024-05-30 ENCOUNTER — Encounter: Payer: Self-pay | Admitting: Emergency Medicine

## 2024-05-30 ENCOUNTER — Ambulatory Visit: Payer: Self-pay

## 2024-05-30 DIAGNOSIS — R300 Dysuria: Secondary | ICD-10-CM

## 2024-05-30 DIAGNOSIS — N3001 Acute cystitis with hematuria: Secondary | ICD-10-CM | POA: Insufficient documentation

## 2024-05-30 DIAGNOSIS — N760 Acute vaginitis: Secondary | ICD-10-CM | POA: Diagnosis not present

## 2024-05-30 LAB — POCT URINALYSIS DIP (MANUAL ENTRY)
Bilirubin, UA: NEGATIVE
Glucose, UA: NEGATIVE mg/dL
Ketones, POC UA: NEGATIVE mg/dL
Nitrite, UA: NEGATIVE
Protein Ur, POC: NEGATIVE mg/dL
Spec Grav, UA: 1.02 (ref 1.010–1.025)
Urobilinogen, UA: 0.2 U/dL
pH, UA: 7 (ref 5.0–8.0)

## 2024-05-30 MED ORDER — NITROFURANTOIN MONOHYD MACRO 100 MG PO CAPS: 100.0000 mg | ORAL_CAPSULE | Freq: Two times a day (BID) | ORAL | 0 refills | Status: DC

## 2024-05-30 MED ORDER — PHENAZOPYRIDINE HCL 200 MG PO TABS: 200.0000 mg | ORAL_TABLET | ORAL | 0 refills | Status: AC

## 2024-05-30 MED ORDER — KETOROLAC TROMETHAMINE 60 MG/2ML IM SOLN
60.0000 mg | Freq: Once | INTRAMUSCULAR | Status: AC
  Administered 2024-05-30: 60 mg via INTRAMUSCULAR

## 2024-05-30 MED ORDER — FLUCONAZOLE 150 MG PO TABS: 150.0000 mg | ORAL_TABLET | ORAL | 0 refills | Status: AC

## 2024-05-30 NOTE — ED Triage Notes (Signed)
 Pt c/o vaginal itching, lower abdominal pain and burning with urination since Tuesday.  She was taking abx for URI which she finsihed Monday. Pt had gyn appointment on Wednesday and they ran test which came back negative

## 2024-05-30 NOTE — ED Provider Notes (Signed)
 [facility]    CSN: [id number] Arrival date & time: 05/30/24  1758      History   Chief Complaint No chief complaint on file.   HPI Kendra Rodriguez is a 58 y.o. female.   Discussed the use of AI scribe software for clinical note transcription with the patient, who gave verbal consent to proceed.   The patient presents with vaginal itching, lower abdominal pain, and burning during urination for 6 days. She also reports feeling pressure when urinating. The patient recently completed an antibiotic course for a sinus infection on July 14th. She saw her primary care physician on July 23rd, who suspected a yeast infection secondary to antibiotic use and prescribed medication. The patient's current symptoms have persisted despite this intervention. The patient denies fever, nausea, or vomiting. She mentions lower back pain, which may be associated with her current urinary symptoms.  The following portions of the patient's history were reviewed and updated as appropriate: allergies, current medications, past family history, past medical history, past social history, past surgical history, and problem list.    Past Medical History:  Diagnosis Date   Acute sinusitis    Allergy    Anxiety    Depression    NO MEDICATIONS - DOING OK NOW   Dizziness    Ear ache    RIGHT --HX OF MULTIPLE EAR INFECTIONS AS A CHILD    GERD (gastroesophageal reflux disease)    Headache(784.0)    MIGRAINES   Leg pain, left    with swelling   Lumbar stenosis    PAIN BACK AND LEFT LEG AND NUMBNESS LEG AND FOOT   Peripheral vascular disease (HCC)    HX OF TREATMENT FOR VARICOSE VEINS RIGHT LEG   Thyroid  disease    TOLD BLOOD LEVELS SLIGHTLY ABNORMAL - BUT NOT REQUIRED MEDICATION    Patient Active Problem List   Diagnosis Date Noted   Bacterial sinusitis 05/16/2024   Sinus congestion 05/16/2024   Acute cough 11/10/2023   Raynaud's syndrome with gangrene (HCC) 09/15/2023   Lower respiratory  infection 09/02/2023   Flu-like symptoms 09/02/2023   Chronic pain of left knee 07/30/2023   Dysuria 03/09/2023   Chronic pelvic pain in female 03/09/2023   Chronic bilateral low back pain without sciatica 03/09/2023   Primary osteoarthritis of left knee 09/08/2022   Vitamin D  deficiency 09/08/2022   Hypothyroidism 11/29/2021   Body mass index (BMI) of 35.0-35.9 in adult 06/04/2020   History of migraine headaches 12/06/2019   Dyslipidemia 06/30/2019   GERD with esophagitis 06/30/2019   Chronic vertigo 04/13/2018   Prediabetes 10/17/2015   Spinal stenosis, lumbar region, with neurogenic claudication 10/24/2014    Past Surgical History:  Procedure Laterality Date   BACK SURGERY     bladder lift     AT SAME TIME OF HYSTERECTOMY   BRAVO PH STUDY N/A 07/26/2021   Procedure: BRAVO PH STUDY;  Surgeon: Rollin Dover, MD;  Location: [facility];  Service: Endoscopy;  Laterality: N/A;   CHOLECYSTECTOMY     ESOPHAGOGASTRODUODENOSCOPY (EGD) WITH PROPOFOL  N/A 07/26/2021   Procedure: ESOPHAGOGASTRODUODENOSCOPY (EGD) WITH PROPOFOL ;  Surgeon: Rollin Dover, MD;  Location: [facility];  Service: Endoscopy;  Laterality: N/A;   LUMBAR LAMINECTOMY/DECOMPRESSION MICRODISCECTOMY Left 10/24/2014   Procedure: COMPLETE LUMBAR DECOMPRESSION L4-L5 CENTRAL AND MICRODISCECTOMY L4-L5 LEFT;  Surgeon: Tanda DELENA Heading, MD;  Location: [facility];  Service: Orthopedics;  Laterality: Left;   POLYPECTOMY  07/26/2021   Procedure: POLYPECTOMY;  Surgeon: Rollin Dover, MD;  Location:  [facility];  Service: Endoscopy;;   VAGINAL HYSTERECTOMY      OB History     Gravida  5   Para      Term      Preterm      AB      Living  5      SAB      IAB      Ectopic      Multiple      Live Births  5            Home Medications    Prior to Admission medications   Medication Sig Start Date End Date Taking? Authorizing Provider  fluconazole  (DIFLUCAN ) 150 MG tablet Take 1 tablet (150 mg total) by  mouth every 3 (three) days for 2 doses. 05/30/24 06/03/24 Yes Iola Lukes, FNP  nitrofurantoin , macrocrystal-monohydrate, (MACROBID ) 100 MG capsule Take 1 capsule (100 mg total) by mouth 2 (two) times daily. 05/30/24  Yes Brant Peets, FNP  phenazopyridine  (PYRIDIUM ) 200 MG tablet Take 1 tablet (200 mg total) by mouth 3 (three) times daily at 8am, 3pm and bedtime for 2 days. 05/30/24 06/01/24 Yes Iola Lukes, FNP  ALPRAZolam  (XANAX ) 0.5 MG tablet Take 1 tablet (0.5 mg total) by mouth daily as needed for anxiety. 12/31/22   Purcell Emil Schanz, MD  atorvastatin  (LIPITOR) 40 MG tablet TAKE 1 TABLET(40 MG) BY MOUTH DAILY 04/01/24   Sagardia, Miguel Jose, MD  benzonatate  (TESSALON ) 200 MG capsule Take 1 capsule (200 mg total) by mouth 3 (three) times daily as needed. 11/08/23   Allwardt, Alyssa M, PA-C  cetirizine  (ZYRTEC  ALLERGY) 10 MG tablet Take 1 tablet (10 mg total) by mouth daily. 02/04/23   Christopher Savannah, PA-C  cyclobenzaprine  (FLEXERIL ) 10 MG tablet Take 1 tablet (10 mg total) by mouth 2 (two) times daily as needed for muscle spasms. 05/18/23   Zelaya, Oscar A, PA-C  diazepam  (VALIUM ) 5 MG tablet Take 1 tablet (5 mg total) by mouth once as needed for up to 1 dose (prior to MRI for anxiety). 08/13/23   Georgina Ozell LABOR, MD  famotidine  (PEPCID ) 20 MG tablet Take 1 tablet (20 mg total) by mouth 2 (two) times daily. 02/04/23   Christopher Savannah, PA-C  fluticasone  (FLONASE ) 50 MCG/ACT nasal spray SHAKE LIQUID AND USE 1 SPRAY IN EACH NOSTRIL DAILY Patient not taking: Reported on 05/16/2024 04/09/23   Purcell Emil Schanz, MD  fluticasone  (FLONASE ) 50 MCG/ACT nasal spray Place 2 sprays into both nostrils daily. 05/10/24   Purcell Emil Schanz, MD  HYDROcodone  bit-homatropine [facility]) 5-1.5 MG/5ML syrup Take 5 mLs by mouth at bedtime as needed for cough. 05/11/24   Sagardia, Miguel Jose, MD  ipratropium (ATROVENT ) 0.03 % nasal spray Place 2 sprays into both nostrils every 12 (twelve) hours. 06/22/23   Mayer, Jodi  R, NP  levothyroxine  (SYNTHROID ) 50 MCG tablet TAKE 1 TABLET(50 MCG) BY MOUTH DAILY 10/06/23   Thapa, [place], MD  metFORMIN  (GLUCOPHAGE -XR) 500 MG 24 hr tablet Take 1 tablet (500 mg total) by mouth 2 (two) times daily with a meal. Patient not taking: Reported on 05/16/2024 05/21/23   Dartha Ernst, MD  Olopatadine  HCl 0.2 % SOLN Apply 1 drop to eye daily as needed. Patient not taking: Reported on 05/16/2024 02/04/23   Christopher Savannah, PA-C  pantoprazole  (PROTONIX ) 40 MG tablet Take 40 mg by mouth daily before breakfast.    [provider]  predniSONE  (DELTASONE ) 50 MG tablet Take 1 tab daily with breakfast for 3 days  Patient not taking: Reported on 05/16/2024 07/14/23   Stuart Vernell Norris, PA-C  pregabalin  (LYRICA ) 75 MG capsule Take 1 capsule (75 mg total) by mouth 2 (two) times daily. 07/02/23 05/16/24  Georgina Ozell LABOR, MD  trimethoprim -polymyxin b  (POLYTRIM ) ophthalmic solution Place 1 drop into the left eye every 6 (six) hours. Patient not taking: Reported on 05/16/2024 07/14/23   Stuart Vernell Norris, PA-C    Family History Family History  Problem Relation Age of Onset   Hypertension Mother    Arthritis Mother    Diabetes Mother    Hyperlipidemia Mother    Arthritis Sister    Rheum arthritis Brother    Autoimmune disease Brother    Lung disease Brother    Arthritis Sister    Gout Son    High Cholesterol Daughter    Thyroid  disease Neg Hx     Social History Social History   Tobacco Use   Smoking status: Never   Smokeless tobacco: Never  Vaping Use   Vaping status: Never Used  Substance Use Topics   Alcohol use: No   Drug use: Never     Allergies   Patient has no known allergies.   Review of Systems Review of Systems  Constitutional:  Negative for fever.  Gastrointestinal:  Positive for abdominal pain. Negative for nausea and vomiting.  Genitourinary:  Positive for dysuria and flank pain. Negative for menstrual problem and vaginal discharge.  Musculoskeletal:   Positive for back pain.  All other systems reviewed and are negative.    Physical Exam Triage Vital Signs ED Triage Vitals  Encounter Vitals Group     BP 05/30/24 1808 138/83     Girls Systolic BP Percentile --      Girls Diastolic BP Percentile --      Boys Systolic BP Percentile --      Boys Diastolic BP Percentile --      Pulse Rate 05/30/24 1808 76     Resp 05/30/24 1805 17     Temp 05/30/24 1808 97.9 F (36.6 C)     Temp Source 05/30/24 1805 Oral     SpO2 05/30/24 1808 97 %     Weight --      Height --      Head Circumference --      Peak Flow --      Pain Score 05/30/24 1808 0     Pain Loc --      Pain Education --      Exclude from Growth Chart --    No data found.  Updated Vital Signs BP 138/83 (BP Location: Right Arm)   Pulse 76   Temp 97.9 F (36.6 C) (Oral)   Resp 16   SpO2 97%   Visual Acuity Right Eye Distance:   Left Eye Distance:   Bilateral Distance:    Right Eye Near:   Left Eye Near:    Bilateral Near:     Physical Exam Constitutional:      General: She is not in acute distress.    Appearance: Normal appearance. She is not ill-appearing, toxic-appearing or diaphoretic.  HENT:     Head: Normocephalic.     Nose: Nose normal.     Mouth/Throat:     Mouth: Mucous membranes are moist.  Eyes:     Conjunctiva/sclera: Conjunctivae normal.  Cardiovascular:     Rate and Rhythm: Normal rate.  Pulmonary:     Effort: Pulmonary effort is normal.  Abdominal:  General: Bowel sounds are normal. There is no distension.     Palpations: Abdomen is soft.     Tenderness: There is abdominal tenderness in the suprapubic area. There is right CVA tenderness and left CVA tenderness.  Genitourinary:    Comments: Deferred; patient performed self-swab for Aptima testing  Musculoskeletal:        General: Normal range of motion.     Cervical back: Normal range of motion and neck supple.     Lumbar back: Tenderness present.  Skin:    General: Skin is warm  and dry.  Neurological:     General: No focal deficit present.     Mental Status: She is alert and oriented to person, place, and time.  Psychiatric:        Mood and Affect: Mood normal.        Behavior: Behavior normal.      UC Treatments / Results  Labs (all labs ordered are listed, but only abnormal results are displayed) Labs Reviewed  POCT URINALYSIS DIP (MANUAL ENTRY) - Abnormal; Notable for the following components:      Result Value   Blood, UA trace-intact (*)    Leukocytes, UA Small (1+) (*)    All other components within normal limits  URINE CULTURE  CERVICOVAGINAL ANCILLARY ONLY    EKG   Radiology No results found.  Procedures Procedures (including critical care time)  Medications Ordered in UC Medications  ketorolac  (TORADOL ) injection 60 mg (60 mg Intramuscular Given 05/30/24 1842)    Initial Impression / Assessment and Plan / UC Course  I have reviewed the triage vital signs and the nursing notes.  Pertinent labs & imaging results that were available during my care of the patient were reviewed by me and considered in my medical decision making (see chart for details).     Patient presents with a 6-day history of urinary symptoms including dysuria, urinary frequency, and lower abdominal pain, consistent with a urinary tract infection. Urinalysis shows evidence of mild infection. Patient recently completed antibiotics for a sinus infection on July 14 and was seen by her primary care provider on July 23, who suspected a yeast infection related to antibiotic use and prescribed Diflucan , which was ineffective. In-office treatment today includes an intramuscular injection of Toradol  for pain relief. Macrobid  was prescribed for UTI treatment, along with phenazopyridine  to relieve urinary discomfort for the next two days. Urine was sent for culture, and the patient was advised to take acetaminophen  1000 mg every six hours as needed for pain. Given her report of  persistent vaginal itching and incomplete relief from initial antifungal treatment, fluconazole  was prescribed with a second dose scheduled in three days. A vaginal swab was obtained to evaluate for yeast or bacterial infection. Patient was advised to follow up for test results and to return immediately if symptoms worsen or if she develops fever, nausea, vomiting, or lower back pain, which may indicate progression to pyelonephritis.  Today's evaluation has revealed no signs of a dangerous process. Discussed diagnosis with patient and/or guardian. Patient and/or guardian aware of their diagnosis, possible red flag symptoms to watch out for and need for close follow up. Patient and/or guardian understands verbal and written discharge instructions. Patient and/or guardian comfortable with plan and disposition.  Patient and/or guardian has a clear mental status at this time, good insight into illness (after discussion and teaching) and has clear judgment to make decisions regarding their care  Documentation was completed with the aid of voice  recognition software. Transcription may contain typographical errors.  Final Clinical Impressions(s) / UC Diagnoses   Final diagnoses:  Dysuria  Vaginitis and vulvovaginitis  Acute cystitis with hematuria     Discharge Instructions      Usted fue evaluada hoy por sntomas consistentes con una infeccin del tracto urinario y ignacia posible infeccin vaginal por hongos. Se recolectaron una muestra de orina y un exudado vaginal para ayudar a Administrator, arts. Se le recet Macrobid  para tratar la infeccin urinaria y fenazopiridina para Paramedic las molestias al Geographical information systems officer durante los [address] Tambin se le recet un medicamento antifngico, fluconazol, con instrucciones de tomar una dosis hoy y ignacia segunda dosis en [employer]. Se le administr una inyeccin de Toradol  en la clnica para Engineer, materials. Puede tomar acetaminofn 1000 mg cada seis horas segn  sea necesario para control adicional del dolor.  Beba abundante agua, evite la cafena y el alcohol, y use ropa suelta y transpirable para reducir la irritacin. Vigile sus sntomas de cerca. Comunquese con su proveedor de atencin primaria si los sntomas persisten o empeoran. Busque atencin mdica de emergencia si presenta fiebre, escalofros, nuseas, vmitos, dolor en la parte baja de la espalda o dificultad para orinar, ya que estos pueden ser signos de una infeccin ms grave. Nos comunicaremos con usted si los [employer] pruebas requieren un cambio en el tratamiento; de lo contrario, puede ver sus [employer].  You were evaluated today for symptoms consistent with a urinary tract infection and possible vaginal yeast infection. A urine sample and vaginal swab were collected to help guide treatment. You were prescribed Macrobid  to treat the UTI and phenazopyridine  to relieve urinary discomfort for the next two days. An antifungal medication, fluconazole , was also prescribed to treat possible yeast infection, with instructions to take one dose today and a second dose in three days. A Toradol  injection was given in the clinic to help with pain. You may take acetaminophen  1000 mg every six hours as needed for additional relief. Drink plenty of water, avoid caffeine and alcohol, and wear loose, breathable clothing to reduce irritation. Monitor your symptoms closely. Contact your primary care provider if symptoms persist or worsen. Seek emergency care if you develop fever, chills, nausea, vomiting, lower back pain, or difficulty urinating, as these may indicate a more serious infection. You will be contacted if any test results require a change in treatment; otherwise, you can view your results on MyChart.      ED Prescriptions     Medication Sig Dispense Auth. Provider   phenazopyridine  (PYRIDIUM ) 200 MG tablet Take 1 tablet (200 mg total) by mouth 3 (three) times daily at 8am, 3pm  and bedtime for 2 days. 6 tablet Iola Lukes, FNP   fluconazole  (DIFLUCAN ) 150 MG tablet Take 1 tablet (150 mg total) by mouth every 3 (three) days for 2 doses. 2 tablet Iola Lukes, FNP   nitrofurantoin , macrocrystal-monohydrate, (MACROBID ) 100 MG capsule Take 1 capsule (100 mg total) by mouth 2 (two) times daily. 10 capsule Iola Lukes, FNP      PDMP not reviewed this encounter.   Iola Lukes, [place] 05/30/24 [phone]

## 2024-05-30 NOTE — Discharge Instructions (Addendum)
 Usted fue evaluada hoy por sntomas consistentes con una infeccin del tracto urinario y ignacia posible infeccin vaginal por hongos. Se recolectaron una muestra de orina y un exudado vaginal para ayudar a Administrator, arts. Se le recet Macrobid  para tratar la infeccin urinaria y fenazopiridina para Paramedic las molestias al Geographical information systems officer durante los [address] Tambin se le recet un medicamento antifngico, fluconazol, con instrucciones de tomar una dosis hoy y ignacia segunda dosis en [employer]. Se le administr una inyeccin de Toradol  en la clnica para Engineer, materials. Puede tomar acetaminofn 1000 mg cada seis horas segn sea necesario para control adicional del dolor.  Beba abundante agua, evite la cafena y el alcohol, y use ropa suelta y transpirable para reducir la irritacin. Vigile sus sntomas de cerca. Comunquese con su proveedor de atencin primaria si los sntomas persisten o empeoran. Busque atencin mdica de emergencia si presenta fiebre, escalofros, nuseas, vmitos, dolor en la parte baja de la espalda o dificultad para orinar, ya que estos pueden ser signos de una infeccin ms grave. Nos comunicaremos con usted si los [employer] pruebas requieren un cambio en el tratamiento; de lo contrario, puede ver sus [employer].  You were evaluated today for symptoms consistent with a urinary tract infection and possible vaginal yeast infection. A urine sample and vaginal swab were collected to help guide treatment. You were prescribed Macrobid  to treat the UTI and phenazopyridine  to relieve urinary discomfort for the next two days. An antifungal medication, fluconazole , was also prescribed to treat possible yeast infection, with instructions to take one dose today and a second dose in three days. A Toradol  injection was given in the clinic to help with pain. You may take acetaminophen  1000 mg every six hours as needed for additional relief. Drink plenty of water, avoid caffeine  and alcohol, and wear loose, breathable clothing to reduce irritation. Monitor your symptoms closely. Contact your primary care provider if symptoms persist or worsen. Seek emergency care if you develop fever, chills, nausea, vomiting, lower back pain, or difficulty urinating, as these may indicate a more serious infection. You will be contacted if any test results require a change in treatment; otherwise, you can view your results on MyChart.

## 2024-05-31 LAB — CERVICOVAGINAL ANCILLARY ONLY
Bacterial Vaginitis (gardnerella): NEGATIVE
Candida Glabrata: NEGATIVE
Candida Vaginitis: NEGATIVE
Chlamydia: NEGATIVE
Comment: NEGATIVE
Comment: NEGATIVE
Comment: NEGATIVE
Comment: NEGATIVE
Comment: NEGATIVE
Comment: NORMAL
Neisseria Gonorrhea: NEGATIVE
Trichomonas: NEGATIVE

## 2024-05-31 LAB — URINE CULTURE: Culture: NO GROWTH

## 2024-06-01 ENCOUNTER — Ambulatory Visit (HOSPITAL_COMMUNITY): Payer: Self-pay

## 2024-06-15 NOTE — Progress Notes (Deleted)
 Office Visit Note  Patient: Kendra Rodriguez             Date of Birth: 03/23/1966           MRN: [id number]             PCP: Purcell Emil Schanz, MD Referring: Purcell Emil Schanz, * Visit Date: 06/29/2024 Occupation: @GUAROCC @  Subjective:  No chief complaint on file.   History of Present Illness: Kendra Rodriguez is a 58 y.o. female ***patient was initially evaluated by me in 2020 per request of Dr. Theophilus.  At the time she presented with sore throat and dry mouth.  She also gave history of episodic headaches and dizziness.  She gives history of migraines.  She was also experiencing pain in multiple joints including shoulders, hands, knees and her feet.    Activities of Daily Living:  Patient reports morning stiffness for *** {minute/hour:19697}.   Patient {ACTIONS;DENIES/REPORTS:21021675::Denies} nocturnal pain.  Difficulty dressing/grooming: {ACTIONS;DENIES/REPORTS:21021675::Denies} Difficulty climbing stairs: {ACTIONS;DENIES/REPORTS:21021675::Denies} Difficulty getting out of chair: {ACTIONS;DENIES/REPORTS:21021675::Denies} Difficulty using hands for taps, buttons, cutlery, and/or writing: {ACTIONS;DENIES/REPORTS:21021675::Denies}  No Rheumatology ROS completed.   PMFS History:  Patient Active Problem List   Diagnosis Date Noted   Bacterial sinusitis 05/16/2024   Sinus congestion 05/16/2024   Acute cough 11/10/2023   Raynaud's syndrome with gangrene (HCC) 09/15/2023   Lower respiratory infection 09/02/2023   Flu-like symptoms 09/02/2023   Chronic pain of left knee 07/30/2023   Dysuria 03/09/2023   Chronic pelvic pain in female 03/09/2023   Chronic bilateral low back pain without sciatica 03/09/2023   Primary osteoarthritis of left knee 09/08/2022   Vitamin D  deficiency 09/08/2022   Hypothyroidism 11/29/2021   Body mass index (BMI) of 35.0-35.9 in adult 06/04/2020   History of migraine headaches 12/06/2019   Dyslipidemia 06/30/2019   GERD with esophagitis  06/30/2019   Chronic vertigo 04/13/2018   Prediabetes 10/17/2015   Spinal stenosis, lumbar region, with neurogenic claudication 10/24/2014    Past Medical History:  Diagnosis Date   Acute sinusitis    Allergy    Anxiety    Depression    NO MEDICATIONS - DOING OK NOW   Dizziness    Ear ache    RIGHT --HX OF MULTIPLE EAR INFECTIONS AS A CHILD    GERD (gastroesophageal reflux disease)    Headache(784.0)    MIGRAINES   Leg pain, left    with swelling   Lumbar stenosis    PAIN BACK AND LEFT LEG AND NUMBNESS LEG AND FOOT   Peripheral vascular disease (HCC)    HX OF TREATMENT FOR VARICOSE VEINS RIGHT LEG   Thyroid  disease    TOLD BLOOD LEVELS SLIGHTLY ABNORMAL - BUT NOT REQUIRED MEDICATION    Family History  Problem Relation Age of Onset   Hypertension Mother    Arthritis Mother    Diabetes Mother    Hyperlipidemia Mother    Arthritis Sister    Rheum arthritis Brother    Autoimmune disease Brother    Lung disease Brother    Arthritis Sister    Gout Son    High Cholesterol Daughter    Thyroid  disease Neg Hx    Past Surgical History:  Procedure Laterality Date   BACK SURGERY     bladder lift     AT SAME TIME OF HYSTERECTOMY   BRAVO PH STUDY N/A 07/26/2021   Procedure: BRAVO PH STUDY;  Surgeon: Rollin Dover, MD;  Location: [facility];  Service: Endoscopy;  Laterality: N/A;  CHOLECYSTECTOMY     ESOPHAGOGASTRODUODENOSCOPY (EGD) WITH PROPOFOL  N/A 07/26/2021   Procedure: ESOPHAGOGASTRODUODENOSCOPY (EGD) WITH PROPOFOL ;  Surgeon: Rollin Dover, MD;  Location: [facility];  Service: Endoscopy;  Laterality: N/A;   LUMBAR LAMINECTOMY/DECOMPRESSION MICRODISCECTOMY Left 10/24/2014   Procedure: COMPLETE LUMBAR DECOMPRESSION L4-L5 CENTRAL AND MICRODISCECTOMY L4-L5 LEFT;  Surgeon: Tanda DELENA Heading, MD;  Location: [facility];  Service: Orthopedics;  Laterality: Left;   POLYPECTOMY  07/26/2021   Procedure: POLYPECTOMY;  Surgeon: Rollin Dover, MD;  Location: [facility];  Service:  Endoscopy;;   VAGINAL HYSTERECTOMY     Social History   Social History Narrative   ** Merged History Encounter **       Immunization History  Administered Date(s) Administered   Influenza Inj Mdck Quad With Preservative 08/20/2020   Influenza,inj,Quad PF,6+ Mos 10/25/2014, 10/17/2015   Influenza-Unspecified 09/01/2022, 08/26/2023   PFIZER(Purple Top)SARS-COV-2 Vaccination 01/02/2020, 01/25/2020   Tdap 01/28/2012   Zoster Recombinant(Shingrix ) 09/08/2022     Objective: Vital Signs: There were no vitals taken for this visit.   Physical Exam   Musculoskeletal Exam: ***  CDAI Exam: CDAI Score: -- Patient Global: --; Provider Global: -- Swollen: --; Tender: -- Joint Exam 06/29/2024   No joint exam has been documented for this visit   There is currently no information documented on the homunculus. Go to the Rheumatology activity and complete the homunculus joint exam.  Investigation: No additional findings.  Imaging: No results found.  Recent Labs: Lab Results  Component Value Date   WBC 5.2 03/09/2023   HGB 12.8 03/09/2023   PLT 274.0 03/09/2023   NA 139 03/09/2023   K 3.9 03/09/2023   CL 103 03/09/2023   CO2 27 03/09/2023   GLUCOSE 103 (H) 03/09/2023   BUN 17 03/09/2023   CREATININE 0.61 03/09/2023   BILITOT 0.6 03/09/2023   ALKPHOS 91 03/09/2023   AST 14 03/09/2023   ALT 11 03/09/2023   PROT 7.3 03/09/2023   ALBUMIN 4.1 03/09/2023   CALCIUM  9.3 03/09/2023   GFRAA 124 06/04/2020   July 22, 2019 ANA negative, ENA (RNP, Smith, SSA, SSB, dsDNA, SCL 70) negative, C3-C4 normal, RF negative, anti-CCP negative, sed rate 19, G6PD normal   Speciality Comments: No specialty comments available.  Procedures:  No procedures performed Allergies: Patient has no known allergies.   Assessment / Plan:     Visit Diagnoses: Raynaud's syndrome with gangrene (HCC) - All autoimmune workup negative in September 2020  Dry mouth  Primary osteoarthritis of both  hands - Previous x-rays in 2020 showed early degenerative changes.  Primary osteoarthritis of left knee - Previous x-rays showed moderate to severe osteoarthritis and chondromalacia patella.  Primary osteoarthritis of both feet - Previous x-rays were suggestive of osteoarthritis.  Spinal stenosis, lumbar region, with neurogenic claudication - Status post decompression and laminectomy.  Vitamin D  deficiency  Chronic pelvic pain in female  Dyslipidemia  Prediabetes  Acquired hypothyroidism  History of migraine headaches  Gastroesophageal reflux disease with esophagitis without hemorrhage  Chronic vertigo  Body mass index (BMI) of 35.0-35.9 in adult  Orders: No orders of the defined types were placed in this encounter.  No orders of the defined types were placed in this encounter.  Family history of ILD-her brother passed away from ILD  Sicca Face-to-face time spent with patient was *** minutes. Greater than 50% of time was spent in counseling and coordination of care.  Follow-Up Instructions: No follow-ups on file.   Maya Nash, MD  Note - This record has been  created using [employer].  Chart creation errors have been sought, but may not always  have been located. Such creation errors do not reflect on  the standard of medical care.

## 2024-06-29 ENCOUNTER — Encounter: Payer: Self-pay | Admitting: Rheumatology

## 2024-06-29 DIAGNOSIS — Z6835 Body mass index (BMI) 35.0-35.9, adult: Secondary | ICD-10-CM

## 2024-06-29 DIAGNOSIS — Z8669 Personal history of other diseases of the nervous system and sense organs: Secondary | ICD-10-CM

## 2024-06-29 DIAGNOSIS — R42 Dizziness and giddiness: Secondary | ICD-10-CM

## 2024-06-29 DIAGNOSIS — E785 Hyperlipidemia, unspecified: Secondary | ICD-10-CM

## 2024-06-29 DIAGNOSIS — M19042 Primary osteoarthritis, left hand: Secondary | ICD-10-CM

## 2024-06-29 DIAGNOSIS — M19041 Primary osteoarthritis, right hand: Secondary | ICD-10-CM

## 2024-06-29 DIAGNOSIS — M19071 Primary osteoarthritis, right ankle and foot: Secondary | ICD-10-CM

## 2024-06-29 DIAGNOSIS — E559 Vitamin D deficiency, unspecified: Secondary | ICD-10-CM

## 2024-06-29 DIAGNOSIS — M1712 Unilateral primary osteoarthritis, left knee: Secondary | ICD-10-CM

## 2024-06-29 DIAGNOSIS — K2100 Gastro-esophageal reflux disease with esophagitis, without bleeding: Secondary | ICD-10-CM

## 2024-06-29 DIAGNOSIS — I7301 Raynaud's syndrome with gangrene: Secondary | ICD-10-CM

## 2024-06-29 DIAGNOSIS — R682 Dry mouth, unspecified: Secondary | ICD-10-CM

## 2024-06-29 DIAGNOSIS — E039 Hypothyroidism, unspecified: Secondary | ICD-10-CM

## 2024-06-29 DIAGNOSIS — M48062 Spinal stenosis, lumbar region with neurogenic claudication: Secondary | ICD-10-CM

## 2024-06-29 DIAGNOSIS — R7303 Prediabetes: Secondary | ICD-10-CM

## 2024-06-29 DIAGNOSIS — G8929 Other chronic pain: Secondary | ICD-10-CM

## 2024-07-06 ENCOUNTER — Encounter: Payer: Self-pay | Admitting: Emergency Medicine

## 2024-07-06 ENCOUNTER — Ambulatory Visit
Admission: EM | Admit: 2024-07-06 | Discharge: 2024-07-06 | Disposition: A | Discharge status: Home / Self Care | Attending: Emergency Medicine | Admitting: Emergency Medicine

## 2024-07-06 DIAGNOSIS — B9689 Other specified bacterial agents as the cause of diseases classified elsewhere: Secondary | ICD-10-CM | POA: Diagnosis not present

## 2024-07-06 DIAGNOSIS — J019 Acute sinusitis, unspecified: Secondary | ICD-10-CM

## 2024-07-06 DIAGNOSIS — R051 Acute cough: Secondary | ICD-10-CM | POA: Diagnosis not present

## 2024-07-06 DIAGNOSIS — J209 Acute bronchitis, unspecified: Secondary | ICD-10-CM | POA: Diagnosis not present

## 2024-07-06 LAB — POC SOFIA SARS ANTIGEN FIA: SARS Coronavirus 2 Ag: NEGATIVE

## 2024-07-06 MED ORDER — AMOXICILLIN-POT CLAVULANATE 875-125 MG PO TABS
1.0000 | ORAL_TABLET | Freq: Two times a day (BID) | ORAL | 0 refills | Status: AC
Start: 2024-07-06 — End: ?

## 2024-07-06 MED ORDER — BENZONATATE 100 MG PO CAPS: 100.0000 mg | ORAL_CAPSULE | Freq: Three times a day (TID) | ORAL | 0 refills | Status: DC

## 2024-07-06 MED ORDER — PREDNISONE 20 MG PO TABS: 40.0000 mg | ORAL_TABLET | Freq: Every day | ORAL | 0 refills | Status: AC

## 2024-07-06 NOTE — ED Triage Notes (Signed)
 Pt c/o headache, right side sinus pressure/pain, cough, chills, and body aches since Monday.

## 2024-07-06 NOTE — Discharge Instructions (Signed)
 Tome los [employer] veces al da con las comidas. Comience los esteroides maana con el desayuno. Use Tessalon  Perles cada 8 horas para ayudar a suprimir la tos. Asegrese de [employer] bien hidratado con al menos 64 onzas de agua al [employer]. Si tiene fiebre, dolores corporales o escalofros, puede tomar 500 mg de Tylenol  cada 6 horas. Los sntomas deberan mejorar en los prximos das; si no hay mejora o si hay algn cambio, consulte con su mdico de cabecera o regrese a la clnica para una reevaluacin.  Take the antibiotics twice daily with food.  Start the steroids tomorrow with breakfast.  Use the Tessalon  Perles every 8 hours to help suppress your cough.  Ensure you are staying well-hydrated with at least 64 ounces of water daily.  For any fever, body aches or chills he can take 500 mg of Tylenol  every 6 hours. Symptoms should improve over the next few days, if not improvement or any changes follow-up with your primary care provider or return to clinic for reevaluation.

## 2024-07-06 NOTE — ED Provider Notes (Signed)
 [facility]    CSN: [id number] Arrival date & time: 07/06/24  1741      History   Chief Complaint Chief Complaint  Patient presents with   Cough   Generalized Body Aches   Chills   sinus pressure    HPI Kendra Rodriguez is a 58 y.o. female.   Patient presents to clinic over concern of right-sided temporal headache, right-sided pain and pressure, sore throat, cough, chills and bodyaches for the past 3 days.  Has had bilateral ear fullness.  Has been was recently diagnosed with bronchitis.  Patient herself has not had any wheezing or shortness of breath, she does not smoke.  Has tried over-the-counter medications without much improvement.  Daughter providing language interpretation.  The history is provided by the patient and medical records.  Cough   Past Medical History:  Diagnosis Date   Acute sinusitis    Allergy    Anxiety    Depression    NO MEDICATIONS - DOING OK NOW   Dizziness    Ear ache    RIGHT --HX OF MULTIPLE EAR INFECTIONS AS A CHILD    GERD (gastroesophageal reflux disease)    Headache(784.0)    MIGRAINES   Leg pain, left    with swelling   Lumbar stenosis    PAIN BACK AND LEFT LEG AND NUMBNESS LEG AND FOOT   Peripheral vascular disease (HCC)    HX OF TREATMENT FOR VARICOSE VEINS RIGHT LEG   Thyroid  disease    TOLD BLOOD LEVELS SLIGHTLY ABNORMAL - BUT NOT REQUIRED MEDICATION    Patient Active Problem List   Diagnosis Date Noted   Bacterial sinusitis 05/16/2024   Sinus congestion 05/16/2024   Acute cough 11/10/2023   Raynaud's syndrome with gangrene (HCC) 09/15/2023   Lower respiratory infection 09/02/2023   Flu-like symptoms 09/02/2023   Chronic pain of left knee 07/30/2023   Dysuria 03/09/2023   Chronic pelvic pain in female 03/09/2023   Chronic bilateral low back pain without sciatica 03/09/2023   Primary osteoarthritis of left knee 09/08/2022   Vitamin D  deficiency 09/08/2022   Hypothyroidism 11/29/2021   Body mass index  (BMI) of 35.0-35.9 in adult 06/04/2020   History of migraine headaches 12/06/2019   Dyslipidemia 06/30/2019   GERD with esophagitis 06/30/2019   Chronic vertigo 04/13/2018   Prediabetes 10/17/2015   Spinal stenosis, lumbar region, with neurogenic claudication 10/24/2014    Past Surgical History:  Procedure Laterality Date   BACK SURGERY     bladder lift     AT SAME TIME OF HYSTERECTOMY   BRAVO PH STUDY N/A 07/26/2021   Procedure: BRAVO PH STUDY;  Surgeon: Rollin Dover, MD;  Location: [facility];  Service: Endoscopy;  Laterality: N/A;   CHOLECYSTECTOMY     ESOPHAGOGASTRODUODENOSCOPY (EGD) WITH PROPOFOL  N/A 07/26/2021   Procedure: ESOPHAGOGASTRODUODENOSCOPY (EGD) WITH PROPOFOL ;  Surgeon: Rollin Dover, MD;  Location: [facility];  Service: Endoscopy;  Laterality: N/A;   LUMBAR LAMINECTOMY/DECOMPRESSION MICRODISCECTOMY Left 10/24/2014   Procedure: COMPLETE LUMBAR DECOMPRESSION L4-L5 CENTRAL AND MICRODISCECTOMY L4-L5 LEFT;  Surgeon: Tanda DELENA Heading, MD;  Location: [facility];  Service: Orthopedics;  Laterality: Left;   POLYPECTOMY  07/26/2021   Procedure: POLYPECTOMY;  Surgeon: Rollin Dover, MD;  Location: [facility];  Service: Endoscopy;;   VAGINAL HYSTERECTOMY      OB History     Gravida  5   Para      Term      Preterm      AB  Living  5      SAB      IAB      Ectopic      Multiple      Live Births  5            Home Medications    Prior to Admission medications   Medication Sig Start Date End Date Taking? Authorizing Provider  amoxicillin -clavulanate (AUGMENTIN ) 875-125 MG tablet Take 1 tablet by mouth every 12 (twelve) hours. 07/06/24  Yes Kyo Cocuzza  N, FNP  benzonatate  (TESSALON ) 100 MG capsule Take 1 capsule (100 mg total) by mouth every 8 (eight) hours. 07/06/24  Yes Karene Bracken  N, FNP  predniSONE  (DELTASONE ) 20 MG tablet Take 2 tablets (40 mg total) by mouth daily with breakfast for 5 days. 07/06/24 07/11/24 Yes Kiah Vanalstine  N,  FNP  ALPRAZolam  (XANAX ) 0.5 MG tablet Take 1 tablet (0.5 mg total) by mouth daily as needed for anxiety. 12/31/22   Purcell Emil Schanz, MD  atorvastatin  (LIPITOR) 40 MG tablet TAKE 1 TABLET(40 MG) BY MOUTH DAILY 04/01/24   Sagardia, Miguel Jose, MD  cetirizine  (ZYRTEC  ALLERGY) 10 MG tablet Take 1 tablet (10 mg total) by mouth daily. 02/04/23   Christopher Savannah, PA-C  cyclobenzaprine  (FLEXERIL ) 10 MG tablet Take 1 tablet (10 mg total) by mouth 2 (two) times daily as needed for muscle spasms. 05/18/23   Zelaya, Oscar A, PA-C  diazepam  (VALIUM ) 5 MG tablet Take 1 tablet (5 mg total) by mouth once as needed for up to 1 dose (prior to MRI for anxiety). 08/13/23   Georgina Ozell LABOR, MD  famotidine  (PEPCID ) 20 MG tablet Take 1 tablet (20 mg total) by mouth 2 (two) times daily. 02/04/23   Christopher Savannah, PA-C  fluticasone  (FLONASE ) 50 MCG/ACT nasal spray SHAKE LIQUID AND USE 1 SPRAY IN EACH NOSTRIL DAILY Patient not taking: Reported on 05/16/2024 04/09/23   Purcell Emil Schanz, MD  fluticasone  (FLONASE ) 50 MCG/ACT nasal spray Place 2 sprays into both nostrils daily. 05/10/24   Purcell Emil Schanz, MD  HYDROcodone  bit-homatropine [facility]) 5-1.5 MG/5ML syrup Take 5 mLs by mouth at bedtime as needed for cough. 05/11/24   Sagardia, Miguel Jose, MD  ipratropium (ATROVENT ) 0.03 % nasal spray Place 2 sprays into both nostrils every 12 (twelve) hours. 06/22/23   Mayer, Jodi R, NP  levothyroxine  (SYNTHROID ) 50 MCG tablet TAKE 1 TABLET(50 MCG) BY MOUTH DAILY 10/06/23   Thapa, [place], MD  metFORMIN  (GLUCOPHAGE -XR) 500 MG 24 hr tablet Take 1 tablet (500 mg total) by mouth 2 (two) times daily with a meal. Patient not taking: Reported on 05/16/2024 05/21/23   Dartha Ernst, MD  nitrofurantoin , macrocrystal-monohydrate, (MACROBID ) 100 MG capsule Take 1 capsule (100 mg total) by mouth 2 (two) times daily. 05/30/24   Iola Lukes, FNP  Olopatadine  HCl 0.2 % SOLN Apply 1 drop to eye daily as needed. Patient not taking: Reported on  05/16/2024 02/04/23   Christopher Savannah, PA-C  pantoprazole  (PROTONIX ) 40 MG tablet Take 40 mg by mouth daily before breakfast.    [provider]  pregabalin  (LYRICA ) 75 MG capsule Take 1 capsule (75 mg total) by mouth 2 (two) times daily. 07/02/23 05/16/24  Georgina Ozell LABOR, MD  trimethoprim -polymyxin b  (POLYTRIM ) ophthalmic solution Place 1 drop into the left eye every 6 (six) hours. Patient not taking: Reported on 05/16/2024 07/14/23   Stuart Vernell Norris, PA-C    Family History Family History  Problem Relation Age of Onset   Hypertension Mother    Arthritis  Mother    Diabetes Mother    Hyperlipidemia Mother    Arthritis Sister    Rheum arthritis Brother    Autoimmune disease Brother    Lung disease Brother    Arthritis Sister    Gout Son    High Cholesterol Daughter    Thyroid  disease Neg Hx     Social History Social History   Tobacco Use   Smoking status: Never   Smokeless tobacco: Never  Vaping Use   Vaping status: Never Used  Substance Use Topics   Alcohol use: No   Drug use: Never     Allergies   Patient has no known allergies.   Review of Systems Review of Systems  Per HPI  Physical Exam Triage Vital Signs ED Triage Vitals  Encounter Vitals Group     BP 07/06/24 1831 107/68     Girls Systolic BP Percentile --      Girls Diastolic BP Percentile --      Boys Systolic BP Percentile --      Boys Diastolic BP Percentile --      Pulse Rate 07/06/24 1831 83     Resp 07/06/24 1831 17     Temp 07/06/24 1831 98.3 F (36.8 C)     Temp Source 07/06/24 1831 Oral     SpO2 07/06/24 1831 96 %     Weight --      Height --      Head Circumference --      Peak Flow --      Pain Score 07/06/24 1803 4     Pain Loc --      Pain Education --      Exclude from Growth Chart --    No data found.  Updated Vital Signs BP 107/68 (BP Location: Right Arm)   Pulse 83   Temp 98.3 F (36.8 C) (Oral)   Resp 17   SpO2 96%   Visual Acuity Right Eye Distance:    Left Eye Distance:   Bilateral Distance:    Right Eye Near:   Left Eye Near:    Bilateral Near:     Physical Exam Vitals and nursing note reviewed.  Constitutional:      Appearance: Normal appearance.  HENT:     Head: Normocephalic and atraumatic.     Right Ear: A middle ear effusion is present.     Left Ear: A middle ear effusion is present.     Nose: Congestion and rhinorrhea present.     Right Sinus: Maxillary sinus tenderness present.     Mouth/Throat:     Mouth: Mucous membranes are moist.     Pharynx: Posterior oropharyngeal erythema and postnasal drip present.  Eyes:     Conjunctiva/sclera: Conjunctivae normal.  Cardiovascular:     Rate and Rhythm: Normal rate and regular rhythm.     Heart sounds: Normal heart sounds. No murmur heard. Pulmonary:     Effort: Pulmonary effort is normal. No respiratory distress.     Breath sounds: Normal breath sounds. No wheezing.  Skin:    General: Skin is warm and dry.  Neurological:     General: No focal deficit present.     Mental Status: She is alert and oriented to person, place, and time.  Psychiatric:        Mood and Affect: Mood normal.        Behavior: Behavior normal. Behavior is cooperative.      UC Treatments / Results  Labs (  all labs ordered are listed, but only abnormal results are displayed) Labs Reviewed  POC SOFIA SARS ANTIGEN FIA    EKG   Radiology No results found.  Procedures Procedures (including critical care time)  Medications Ordered in UC Medications - No data to display  Initial Impression / Assessment and Plan / UC Course  I have reviewed the triage vital signs and the nursing notes.  Pertinent labs & imaging results that were available during my care of the patient were reviewed by me and considered in my medical decision making (see chart for details).  Vitals and triage reviewed, patient is hemodynamically stable.  Lungs vesicular, heart with regular rate and rhythm.  Bilateral  middle ear effusions.  Congestion, rhinorrhea and postnasal drip present as well as right-sided maxillary sinus tenderness.  Without overlying erythema, afebrile in clinic, low concern for preseptal or orbital cellulitis at this time.  COVID testing negative.  Presentation suspicious for bacterial sinusitis, will cover with Augmentin .  Oral steroids given to help reduce pain and inflammation, as well as cover for bronchitis.  Plan of care, follow-up care return precautions given, no questions at this time.     Final Clinical Impressions(s) / UC Diagnoses   Final diagnoses:  Acute bacterial sinusitis  Acute cough  Acute bronchitis, unspecified organism     Discharge Instructions      Tome los antibiticos dos veces al da con las comidas. Comience los esteroides maana con el desayuno. Use Tessalon  Perles cada 8 horas para ayudar a suprimir la tos. Asegrese de [employer] bien hidratado con al menos 64 onzas de agua al [employer]. Si tiene fiebre, dolores corporales o escalofros, puede tomar 500 mg de Tylenol  cada 6 horas. Los sntomas deberan mejorar en los prximos das; si no hay mejora o si hay algn cambio, consulte con su mdico de cabecera o regrese a la clnica para una reevaluacin.  Take the antibiotics twice daily with food.  Start the steroids tomorrow with breakfast.  Use the Tessalon  Perles every 8 hours to help suppress your cough.  Ensure you are staying well-hydrated with at least 64 ounces of water daily.  For any fever, body aches or chills he can take 500 mg of Tylenol  every 6 hours. Symptoms should improve over the next few days, if not improvement or any changes follow-up with your primary care provider or return to clinic for reevaluation.    ED Prescriptions     Medication Sig Dispense Auth. Provider   amoxicillin -clavulanate (AUGMENTIN ) 875-125 MG tablet Take 1 tablet by mouth every 12 (twelve) hours. 14 tablet Raffaela Ladley  N, FNP   predniSONE  (DELTASONE ) 20 MG  tablet Take 2 tablets (40 mg total) by mouth daily with breakfast for 5 days. 10 tablet Dreama, Lon Klippel  N, FNP   benzonatate  (TESSALON ) 100 MG capsule Take 1 capsule (100 mg total) by mouth every 8 (eight) hours. 21 capsule Dreama, Abhiram Criado  N, FNP      PDMP not reviewed this encounter.   Dreama, Meghen Akopyan  N, FNP 07/06/24 1835

## 2024-07-07 ENCOUNTER — Ambulatory Visit: Admitting: Emergency Medicine

## 2024-07-26 ENCOUNTER — Encounter: Payer: Self-pay | Admitting: Emergency Medicine

## 2024-07-26 ENCOUNTER — Ambulatory Visit (INDEPENDENT_AMBULATORY_CARE_PROVIDER_SITE_OTHER): Admitting: Emergency Medicine

## 2024-07-26 ENCOUNTER — Ambulatory Visit: Payer: Self-pay | Admitting: Rheumatology

## 2024-07-26 VITALS — BP 122/72 | HR 66 | Temp 98.1°F | Ht 61.0 in | Wt 196.0 lb

## 2024-07-26 DIAGNOSIS — J329 Chronic sinusitis, unspecified: Secondary | ICD-10-CM | POA: Diagnosis not present

## 2024-07-26 DIAGNOSIS — B9689 Other specified bacterial agents as the cause of diseases classified elsewhere: Secondary | ICD-10-CM

## 2024-07-26 DIAGNOSIS — R0981 Nasal congestion: Secondary | ICD-10-CM

## 2024-07-26 DIAGNOSIS — R519 Headache, unspecified: Secondary | ICD-10-CM | POA: Diagnosis not present

## 2024-07-26 MED ORDER — AMOXICILLIN-POT CLAVULANATE 875-125 MG PO TABS: 1.0000 | ORAL_TABLET | Freq: Two times a day (BID) | ORAL | 0 refills | Status: AC

## 2024-07-26 NOTE — Patient Instructions (Signed)
 Infeccin de los senos paranasales en los adultos Sinus Infection, Adult La infeccin de los senos paranasales es el dolor y la hinchazn (inflamacin) de los senos paranasales. Los senos paranasales son espacios vacos en los huesos alrededor del rostro. Estos se encuentran en los siguientes lugares: Alrededor de los ojos. En la mitad de la frente. Detrs de Architectural technologist. En los pmulos. Los senos paranasales y las fosas nasales estn cubiertos de un lquido llamado mucosidad. La mucosidad drena a travs de los senos paranasales. La hinchazn puede atrapar mucosidad en los senos paranasales. Esto permite que se desarrollen grmenes (bacterias, virus u hongos), lo que produce infecciones. La [employer], la causa de esta afeccin es un virus. Cules son las causas? Alergias. Asma. Grmenes. Objetos que obstruyen la nariz o los senos paranasales. Crecimientos en el interior de la nariz (plipos nasales). Sustancias qumicas o irritantes que estn presentes en el aire. Un hongo. Esto es poco frecuente. Qu incrementa el riesgo? Tener debilitado el sistema de defensa del cuerpo (sistema inmunitario). Nadar o bucear mucho. Usar aerosoles nasales con mucha frecuencia. Fumar. Cules son los signos o sntomas? Los principales sntomas de esta afeccin son dolor y sensacin de presin alrededor de los senos paranasales. Otros sntomas incluyen: Nariz tapada (congestin nasal). Esto puede dificultar la respiracin por la nariz. Goteo nasal (drenaje). Dolor, hinchazn y Airline pilot en los senos paranasales. Tos que puede empeorar por la noche. Incapacidad de sentir olores y sabores. Mucosidad que se acumula en la garganta o la parte posterior de la nariz (goteo posnasal). Esto puede causar dolor de garganta o mal aliento. Estar muy cansado (fatigado). Grant Ruts. Cmo se diagnostica? Los sntomas. Sus antecedentes mdicos. Un examen fsico. Pruebas para averiguar si la afeccin es de corta  duracin [place]) o de larga duracin (crnica). El mdico puede: Revisarle la nariz para detectar crecimientos (plipos). Revisarle los senos paranasales con una herramienta que tiene una luz en un extremo (endoscopio). Revisar si tiene alergias o grmenes. Hacerle pruebas de diagnstico por imgenes, como una resonancia magntica (RM) o exploracin por tomografa computarizada (TC). Cmo se trata? El tratamiento de esta afeccin depende de la causa y si es de corta o de larga duracin. Si la causa es un virus, los sntomas Teaching laboratory technician solos en el trmino de [address]. Pueden darle medicamentos para aliviar los sntomas. Incluyen lo siguiente: Medicamentos para Actuary tejido inflamado de la Clinical cytogeneticist. Un aerosol para tratar la hinchazn de las fosas nasales. Enjuagues que ayudan a eliminar la mucosidad espesa de la nariz (lavados con solucin salina nasal). Medicamentos para tratar alergias (antihistamnicos). Analgsicos de [employer]. Si la causa es una bacteria, es posible que el mdico espere para averiguar si usted mejora sin TEFL teacher. Es posible que le den un antibitico si usted tiene: Una infeccin grave. El sistema de defensa del organismo debilitado. Si la causa son crecimientos en la Darene Lamer, es posible que necesite [place]. Siga estas instrucciones en su casa: [employer], use o aplique los medicamentos de venta libre y los recetados solamente como se lo haya indicado el mdico. Estos pueden incluir aerosoles nasales. Si le recetaron un antibitico, tmelo como se lo haya indicado el mdico. No deje de tomarlo aunque comience a sentirse mejor. Hidrtese y humidifique los ambientes  Beba suficiente agua para Pharmacologist el pis (la orina) de color amarillo plido. Use un humidificador de vapor fro para mantener la humedad de su hogar por encima del 50 %. Inhale vapor durante 10 a 15 minutos,  de 3 a 4 veces al da, o como se lo haya indicado el mdico. Puede hacer  esto en el bao con el vapor del agua caliente de la ducha. Trate de no exponerse al aire fro o seco. Reposo Descanse todo lo posible. Duerma con la cabeza levantada (elevada). Asegrese de dormir lo suficiente cada noche. Instrucciones generales  Pngase un pao caliente y hmedo en el rostro 3 a 4 veces al da, o con la frecuencia indicada por el mdico. Hgase lavados con solucin salina nasal con la frecuencia que le haya indicado el mdico. Lvese las manos frecuentemente con agua y [place]. Use un desinfectante para manos si no dispone de [place] y [place]. No fume. Evite estar cerca de personas que fuman (fumador pasivo). Concurra a todas las visitas de seguimiento. Comunquese con un mdico si: Tiene fiebre. Sus sntomas empeoran. Los sntomas no mejoran en el perodo de [address]. Solicite ayuda de inmediato si: Tiene un dolor de cabeza muy intenso. No puede dejar de vomitar. Tiene dolor muy intenso o hinchazn en la zona del rostro o los ojos. Tiene dificultad para ver. Se siente confundido. Tiene el cuello rgido. Tiene dificultad para respirar. Estos sntomas pueden Customer service manager. Solicite ayuda de inmediato. Llame al 911. No espere a ver si los sntomas desaparecen. No conduzca por sus propios medios [employer]. Resumen La infeccin de los senos paranasales es la hinchazn de los senos paranasales. Los senos paranasales son espacios vacos en los huesos alrededor del rostro. La causa de esta afeccin es la inflamacin o hinchazn de los tejidos que estn en el interior de la [place]. Esto hace que los grmenes queden atrapados. Los grmenes pueden provocar infecciones. Si le recetaron un antibitico, tmelo como se lo haya indicado el mdico. No deje de tomarlo aunque comience a sentirse mejor. Concurra a todas las visitas de seguimiento. Esta informacin no tiene Theme park manager el consejo del mdico. Asegrese de hacerle al mdico cualquier pregunta que  tenga. Document Revised: 10/09/2021 Document Reviewed: 10/09/2021 Elsevier Patient Education  2024 [employer].

## 2024-07-26 NOTE — Assessment & Plan Note (Signed)
 Upper viral respiratory infection now with secondary bacterial infection Recommend Augmentin  875 mg twice a day for 7 days Symptom management discussed Advised to rest and stay well-hydrated Advised to contact the office if no better or worse during the next several days

## 2024-07-26 NOTE — Progress Notes (Signed)
 Kendra Rodriguez 58 y.o.   Chief Complaint  Patient presents with   Cough    Patient here for cough, itchy eyes, pressure on her face, and temporal head aches. Patient states this has been going on for about 3 weeks she did see UC on 07/06/24 she was given meds and states it did help but is back. She did do a COVID test at urgent care but it was negative. She is only taking robitussin and allergy medication     HISTORY OF PRESENT ILLNESS: This is a 58 y.o. female A1A complaining of flulike symptoms that started about 4 weeks ago 3 weeks ago went to urgent care center and was diagnosed with a sinus infection.  Treated with antibiotics, steroids, and cough medicine Felt better for couple weeks and 1 week ago symptoms returning.  Mostly complaining of cough, itchy eyes, pressure on her face, headaches and sinus pressure No other associated symptoms No other complaints or medical concerns today.  Cough Associated symptoms include headaches. Pertinent negatives include no chest pain.     Prior to Admission medications   Medication Sig Start Date End Date Taking? Authorizing Provider  atorvastatin  (LIPITOR) 40 MG tablet TAKE 1 TABLET(40 MG) BY MOUTH DAILY 04/01/24  Yes Madeeha Costantino, Emil Schanz, MD  cetirizine  (ZYRTEC  ALLERGY) 10 MG tablet Take 1 tablet (10 mg total) by mouth daily. 02/04/23  Yes Christopher Savannah, PA-C  famotidine  (PEPCID ) 20 MG tablet Take 1 tablet (20 mg total) by mouth 2 (two) times daily. 02/04/23  Yes Christopher Savannah, PA-C  fluticasone  (FLONASE ) 50 MCG/ACT nasal spray Place 2 sprays into both nostrils daily. 05/10/24  Yes Bethaney Oshana Jose, MD  ipratropium (ATROVENT ) 0.03 % nasal spray Place 2 sprays into both nostrils every 12 (twelve) hours. 06/22/23  Yes Mayer, Jodi R, NP  levothyroxine  (SYNTHROID ) 50 MCG tablet TAKE 1 TABLET(50 MCG) BY MOUTH DAILY 10/06/23  Yes Thapa, [place], MD  pantoprazole  (PROTONIX ) 40 MG tablet Take 40 mg by mouth daily before breakfast.   Yes [provider]   ALPRAZolam  (XANAX ) 0.5 MG tablet Take 1 tablet (0.5 mg total) by mouth daily as needed for anxiety. 12/31/22   Britten Parady Jose, MD  amoxicillin -clavulanate (AUGMENTIN ) 875-125 MG tablet Take 1 tablet by mouth every 12 (twelve) hours. Patient not taking: Reported on 07/26/2024 07/06/24   Dreama Eleanora SAILOR, FNP  benzonatate  (TESSALON ) 100 MG capsule Take 1 capsule (100 mg total) by mouth every 8 (eight) hours. Patient not taking: Reported on 07/26/2024 07/06/24   Dreama, [place]  N, FNP  cyclobenzaprine  (FLEXERIL ) 10 MG tablet Take 1 tablet (10 mg total) by mouth 2 (two) times daily as needed for muscle spasms. Patient not taking: Reported on 07/26/2024 05/18/23   Zelaya, Oscar A, PA-C  diazepam  (VALIUM ) 5 MG tablet Take 1 tablet (5 mg total) by mouth once as needed for up to 1 dose (prior to MRI for anxiety). Patient not taking: Reported on 07/26/2024 08/13/23   Georgina Ozell LABOR, MD  fluticasone  (FLONASE ) 50 MCG/ACT nasal spray SHAKE LIQUID AND USE 1 SPRAY IN EACH NOSTRIL DAILY Patient not taking: Reported on 05/16/2024 04/09/23   Purcell Emil Schanz, MD  HYDROcodone  bit-homatropine [facility]) 5-1.5 MG/5ML syrup Take 5 mLs by mouth at bedtime as needed for cough. Patient not taking: Reported on 07/26/2024 05/11/24   Troi Bechtold Jose, MD  metFORMIN  (GLUCOPHAGE -XR) 500 MG 24 hr tablet Take 1 tablet (500 mg total) by mouth 2 (two) times daily with a meal. Patient not taking: Reported on  07/26/2024 05/21/23   Motwani, Komal, MD  nitrofurantoin , macrocrystal-monohydrate, (MACROBID ) 100 MG capsule Take 1 capsule (100 mg total) by mouth 2 (two) times daily. Patient not taking: Reported on 07/26/2024 05/30/24   Iola Lukes, FNP  Olopatadine  HCl 0.2 % SOLN Apply 1 drop to eye daily as needed. Patient not taking: Reported on 07/26/2024 02/04/23   Christopher Savannah, PA-C  pregabalin  (LYRICA ) 75 MG capsule Take 1 capsule (75 mg total) by mouth 2 (two) times daily. Patient not taking: Reported on 07/26/2024 07/02/23  05/16/24  Georgina Ozell LABOR, MD  trimethoprim -polymyxin b  (POLYTRIM ) ophthalmic solution Place 1 drop into the left eye every 6 (six) hours. Patient not taking: Reported on 07/26/2024 07/14/23   Stuart Vernell Norris, PA-C    No Known Allergies  Patient Active Problem List   Diagnosis Date Noted   Bacterial sinusitis 05/16/2024   Sinus congestion 05/16/2024   Acute cough 11/10/2023   Raynaud's syndrome with gangrene (HCC) 09/15/2023   Lower respiratory infection 09/02/2023   Flu-like symptoms 09/02/2023   Chronic pain of left knee 07/30/2023   Dysuria 03/09/2023   Chronic pelvic pain in female 03/09/2023   Chronic bilateral low back pain without sciatica 03/09/2023   Primary osteoarthritis of left knee 09/08/2022   Vitamin D  deficiency 09/08/2022   Hypothyroidism 11/29/2021   Body mass index (BMI) of 35.0-35.9 in adult 06/04/2020   History of migraine headaches 12/06/2019   Dyslipidemia 06/30/2019   GERD with esophagitis 06/30/2019   Chronic vertigo 04/13/2018   Prediabetes 10/17/2015   Spinal stenosis, lumbar region, with neurogenic claudication 10/24/2014    Past Medical History:  Diagnosis Date   Acute sinusitis    Allergy    Anxiety    Depression    NO MEDICATIONS - DOING OK NOW   Dizziness    Ear ache    RIGHT --HX OF MULTIPLE EAR INFECTIONS AS A CHILD    GERD (gastroesophageal reflux disease)    Headache(784.0)    MIGRAINES   Leg pain, left    with swelling   Lumbar stenosis    PAIN BACK AND LEFT LEG AND NUMBNESS LEG AND FOOT   Peripheral vascular disease    HX OF TREATMENT FOR VARICOSE VEINS RIGHT LEG   Thyroid  disease    TOLD BLOOD LEVELS SLIGHTLY ABNORMAL - BUT NOT REQUIRED MEDICATION    Past Surgical History:  Procedure Laterality Date   BACK SURGERY     bladder lift     AT SAME TIME OF HYSTERECTOMY   BRAVO PH STUDY N/A 07/26/2021   Procedure: BRAVO PH STUDY;  Surgeon: Rollin Dover, MD;  Location: [facility];  Service: Endoscopy;  Laterality: N/A;    CHOLECYSTECTOMY     ESOPHAGOGASTRODUODENOSCOPY (EGD) WITH PROPOFOL  N/A 07/26/2021   Procedure: ESOPHAGOGASTRODUODENOSCOPY (EGD) WITH PROPOFOL ;  Surgeon: Rollin Dover, MD;  Location: [facility];  Service: Endoscopy;  Laterality: N/A;   LUMBAR LAMINECTOMY/DECOMPRESSION MICRODISCECTOMY Left 10/24/2014   Procedure: COMPLETE LUMBAR DECOMPRESSION L4-L5 CENTRAL AND MICRODISCECTOMY L4-L5 LEFT;  Surgeon: Tanda LABOR Heading, MD;  Location: [facility];  Service: Orthopedics;  Laterality: Left;   POLYPECTOMY  07/26/2021   Procedure: POLYPECTOMY;  Surgeon: Rollin Dover, MD;  Location: [facility];  Service: Endoscopy;;   VAGINAL HYSTERECTOMY      Social History   Socioeconomic History   Marital status: Married    Spouse name: Not on file   Number of children: Not on file   Years of education: Not on file   Highest education level: 6th grade  Occupational History   Not on file  Tobacco Use   Smoking status: Never   Smokeless tobacco: Never  Vaping Use   Vaping status: Never Used  Substance and Sexual Activity   Alcohol use: No   Drug use: Never   Sexual activity: Yes  Other Topics Concern   Not on file  Social History Narrative   ** Merged History Encounter **       Social Drivers of Health   Financial Resource Strain: Low Risk  (09/18/2023)   Overall Financial Resource Strain (CARDIA)    Difficulty of Paying Living Expenses: Not very hard  Food Insecurity: No Food Insecurity (09/18/2023)   Hunger Vital Sign    Worried About Running Out of Food in the Last Year: Never true    Ran Out of Food in the Last Year: Never true  Transportation Needs: No Transportation Needs (09/18/2023)   PRAPARE - Administrator, Civil Service (Medical): No    Lack of Transportation (Non-Medical): No  Physical Activity: Insufficiently Active (09/18/2023)   Exercise Vital Sign    Days of Exercise per Week: 3 days    Minutes of Exercise per Session: 20 min  Stress: No Stress Concern Present  (09/18/2023)   [employer] of Occupational Health - Occupational Stress Questionnaire    Feeling of Stress : Only a little  Social Connections: Unknown (09/18/2023)   Social Connection and Isolation Panel    Frequency of Communication with Friends and Family: More than three times a week    Frequency of Social Gatherings with Friends and Family: More than three times a week    Attends Religious Services: More than 4 times per year    Active Member of [employer] or Organizations: No    Attends Engineer, structural: Not on file    Marital Status: Not on file  Intimate Partner Violence: Not on file    Family History  Problem Relation Age of Onset   Hypertension Mother    Arthritis Mother    Diabetes Mother    Hyperlipidemia Mother    Arthritis Sister    Rheum arthritis Brother    Autoimmune disease Brother    Lung disease Brother    Arthritis Sister    Gout Son    High Cholesterol Daughter    Thyroid  disease Neg Hx      Review of Systems  Constitutional: Negative.   HENT:  Positive for congestion.   Respiratory:  Positive for cough.   Cardiovascular: Negative.  Negative for chest pain and palpitations.  Gastrointestinal:  Negative for abdominal pain, nausea and vomiting.  Genitourinary: Negative.  Negative for dysuria.  Skin: Negative.   Neurological:  Positive for headaches.  All other systems reviewed and are negative.   Vitals:   07/26/24 0929  BP: 122/72  Pulse: 66  Temp: 98.1 F (36.7 C)  SpO2: 97%    Physical Exam Vitals reviewed.  Constitutional:      Appearance: Normal appearance.  HENT:     Head: Normocephalic.     Right Ear: Tympanic membrane, ear canal and external ear normal.     Left Ear: Tympanic membrane, ear canal and external ear normal.     Nose: Congestion present.     Mouth/Throat:     Mouth: Mucous membranes are moist.     Pharynx: Oropharynx is clear.  Eyes:     Extraocular Movements: Extraocular movements intact.      Pupils: Pupils are equal, round, and  reactive to light.  Cardiovascular:     Rate and Rhythm: Normal rate and regular rhythm.     Pulses: Normal pulses.     Heart sounds: Normal heart sounds.  Pulmonary:     Effort: Pulmonary effort is normal.     Breath sounds: Normal breath sounds.  Musculoskeletal:     Cervical back: No tenderness.  Lymphadenopathy:     Cervical: No cervical adenopathy.  Skin:    General: Skin is warm and dry.     Capillary Refill: Capillary refill takes less than 2 seconds.  Neurological:     General: No focal deficit present.     Mental Status: She is alert and oriented to person, place, and time.  Psychiatric:        Mood and Affect: Mood normal.        Behavior: Behavior normal.      ASSESSMENT & PLAN: Problem List Items Addressed This Visit       Respiratory   Bacterial sinusitis - Primary   Upper viral respiratory infection now with secondary bacterial infection Recommend Augmentin  875 mg twice a day for 7 days Symptom management discussed Advised to rest and stay well-hydrated Advised to contact the office if no better or worse during the next several days      Relevant Medications   amoxicillin -clavulanate (AUGMENTIN ) 875-125 MG tablet   Sinus congestion   Symptom management discussed Advised to continue Flonase  and use saline nasal sprays Nettie pot is recommended Recommend Mucinex  D twice a day Advised to rest and stay well-hydrated        Other   Sinus headache   Symptom management discussed Advised to take Tylenol  and or Advil  as needed for pain      Patient Instructions  Infeccin de los senos paranasales en los adultos Sinus Infection, Adult La infeccin de los senos paranasales es el dolor y la hinchazn (inflamacin) de los senos paranasales. Los senos paranasales son espacios vacos en los huesos alrededor del rostro. Estos se encuentran en los siguientes lugares: Alrededor de los ojos. En la mitad de la frente. Detrs  de Architectural technologist. En los pmulos. Los senos paranasales y las fosas nasales estn cubiertos de un lquido llamado mucosidad. La mucosidad drena a travs de los senos paranasales. La hinchazn puede atrapar mucosidad en los senos paranasales. Esto permite que se desarrollen grmenes (bacterias, virus u hongos), lo que produce infecciones. La [employer], la causa de esta afeccin es un virus. Cules son las causas? Alergias. Asma. Grmenes. Objetos que obstruyen la nariz o los senos paranasales. Crecimientos en el interior de la nariz (plipos nasales). Sustancias qumicas o irritantes que estn presentes en el aire. Un hongo. Esto es poco frecuente. Qu incrementa el riesgo? Tener debilitado el sistema de defensa del cuerpo (sistema inmunitario). Nadar o bucear mucho. Usar aerosoles nasales con mucha frecuencia. Fumar. Cules son los signos o sntomas? Los principales sntomas de esta afeccin son dolor y sensacin de presin alrededor de los senos paranasales. Otros sntomas incluyen: Nariz tapada (congestin nasal). Esto puede dificultar la respiracin por la nariz. Goteo nasal (drenaje). Dolor, hinchazn y Airline pilot en los senos paranasales. Tos que puede empeorar por la noche. Incapacidad de sentir olores y sabores. Mucosidad que se acumula en la garganta o la parte posterior de la nariz (goteo posnasal). Esto puede causar dolor de garganta o mal aliento. Estar muy cansado (fatigado). Lajune. Cmo se diagnostica? Los sntomas. Sus antecedentes mdicos. Un examen fsico. Pruebas para averiguar  si la afeccin es de corta duracin (aguda) o de larga duracin (crnica). El mdico puede: Revisarle la nariz para detectar crecimientos (plipos). Revisarle los senos paranasales con una herramienta que tiene una luz en un extremo (endoscopio). Revisar si tiene alergias o grmenes. Hacerle pruebas de diagnstico por imgenes, como una resonancia magntica (RM) o exploracin por  tomografa computarizada (TC). Cmo se trata? El tratamiento de esta afeccin depende de la causa y si es de corta o de larga duracin. Si la causa es un virus, los sntomas Teaching laboratory technician solos en el trmino de [address]. Pueden darle medicamentos para aliviar los sntomas. Incluyen lo siguiente: Medicamentos para Actuary tejido inflamado de la Clinical cytogeneticist. Un aerosol para tratar la hinchazn de las fosas nasales. Enjuagues que ayudan a eliminar la mucosidad espesa de la nariz (lavados con solucin salina nasal). Medicamentos para tratar alergias (antihistamnicos). Analgsicos de [employer]. Si la causa es una bacteria, es posible que el mdico espere para averiguar si usted mejora sin TEFL teacher. Es posible que le den un antibitico si usted tiene: Una infeccin grave. El sistema de defensa del organismo debilitado. Si la causa son crecimientos en la glorine, es posible que necesite [place]. Siga estas instrucciones en su casa: [employer], use o aplique los medicamentos de venta libre y los recetados solamente como se lo haya indicado el mdico. Estos pueden incluir aerosoles nasales. Si le recetaron un antibitico, tmelo como se lo haya indicado el mdico. No deje de tomarlo aunque comience a sentirse mejor. Hidrtese y humidifique los ambientes  Beba suficiente agua para Pharmacologist el pis (la orina) de color amarillo plido. Use un humidificador de vapor fro para mantener la humedad de su hogar por encima del 50 %. Inhale vapor durante 10 a 15 minutos, de 3 a 4 veces al da, o como se lo haya indicado el mdico. Puede hacer esto en el bao con el vapor del agua caliente de la ducha. Trate de no exponerse al aire fro o seco. Reposo Descanse todo lo posible. Duerma con la cabeza levantada (elevada). Asegrese de dormir lo suficiente cada noche. Instrucciones generales  Pngase un pao caliente y hmedo en el rostro 3 a 4 veces al da, o con la frecuencia indicada por el  mdico. Hgase lavados con solucin salina nasal con la frecuencia que le haya indicado el mdico. Lvese las manos frecuentemente con agua y jabn. Use un desinfectante para manos si no dispone de agua y jabn. No fume. Evite estar cerca de personas que fuman (fumador pasivo). Concurra a todas las visitas de seguimiento. Comunquese con un mdico si: Tiene fiebre. Sus sntomas empeoran. Los sntomas no mejoran en el perodo de [address]. Solicite ayuda de inmediato si: Tiene un dolor de cabeza muy intenso. No puede dejar de vomitar. Tiene dolor muy intenso o hinchazn en la zona del rostro o los ojos. Tiene dificultad para ver. Se siente confundido. Tiene el cuello rgido. Tiene dificultad para respirar. Estos sntomas pueden Customer service manager. Solicite ayuda de inmediato. Llame al 911. No espere a ver si los sntomas desaparecen. No conduzca por sus propios medios [employer]. Resumen La infeccin de los senos paranasales es la hinchazn de los senos paranasales. Los senos paranasales son espacios vacos en los huesos alrededor del rostro. La causa de esta afeccin es la inflamacin o hinchazn de los tejidos que estn en el interior de la [place]. Esto hace que los grmenes queden atrapados. Los grmenes pueden provocar infecciones. Si le recetaron  un antibitico, tmelo como se lo haya indicado el mdico. No deje de tomarlo aunque comience a sentirse mejor. Concurra a todas las visitas de seguimiento. Esta informacin no tiene Theme park manager el consejo del mdico. Asegrese de hacerle al mdico cualquier pregunta que tenga. Document Revised: 10/09/2021 Document Reviewed: 10/09/2021 Elsevier Patient Education  2024 Elsevier Inc.    Emil Schaumann, MD [facility]

## 2024-07-26 NOTE — Assessment & Plan Note (Signed)
 Symptom management discussed Advised to continue Flonase  and use saline nasal sprays Nettie pot is recommended Recommend Mucinex  D twice a day Advised to rest and stay well-hydrated

## 2024-07-26 NOTE — Assessment & Plan Note (Signed)
 Symptom management discussed Advised to take Tylenol  and or Advil  as needed for pain

## 2024-08-12 LAB — LAB REPORT - SCANNED
EGFR: 105
TSH: 1.18 (ref 0.41–5.90)

## 2024-09-05 ENCOUNTER — Encounter: Payer: Self-pay | Admitting: Radiology

## 2024-09-13 ENCOUNTER — Encounter: Payer: Self-pay | Admitting: Endocrinology

## 2024-09-13 NOTE — Telephone Encounter (Signed)
 If she recently had lab work for thyroid  asked to fax or send those results to our clinic and she does not need to do lab work on coming Friday.

## 2024-09-14 ENCOUNTER — Ambulatory Visit: Payer: Self-pay | Admitting: Endocrinology

## 2024-09-16 ENCOUNTER — Other Ambulatory Visit

## 2024-09-21 ENCOUNTER — Ambulatory Visit: Admitting: Endocrinology

## 2024-09-26 ENCOUNTER — Other Ambulatory Visit: Payer: Self-pay

## 2024-09-26 ENCOUNTER — Ambulatory Visit: Admitting: Orthopedic Surgery

## 2024-09-26 DIAGNOSIS — M4316 Spondylolisthesis, lumbar region: Secondary | ICD-10-CM

## 2024-09-26 DIAGNOSIS — M5412 Radiculopathy, cervical region: Secondary | ICD-10-CM

## 2024-09-26 DIAGNOSIS — M5416 Radiculopathy, lumbar region: Secondary | ICD-10-CM

## 2024-09-26 NOTE — Progress Notes (Signed)
 Orthopedic Spine Surgery Office Note   Assessment: Patient is a 58 y.o. female with low back pain that radiates into the bilateral lower extremities (R>L) at the anterolateral leg. Has a spondylolisthesis at L4/5 with foraminal and lateral recess stenosis at that level. Got significant relief of her leg pain with injection     Plan: -Patient has tried PT, acupuncture, Tylenol , NSAIDs, lyrica , lumbar steroid injection -Patient has now had symptoms for over a year and spite of conservative treatments, so discussed surgical option for her.  She is not interested in Surgical management.  She wanted to try a repeat injections as they did provide her with significant relief that lasted for 3 to 4 months.  She also wanted to try physical therapy since she has found that helpful in the past.  Referral provided to her today -Patient should return to office on an as needed basis     Patient expressed understanding of the plan and all questions were answered to the patient's satisfaction.    ___________________________________________________________________________     History:   Patient is a 58 y.o. female who presents today for follow up on her lumbar spine.  Patient comes in today for persistent back pain that radiates into her bilateral lower extremities.  She feels it is more significant on the right side.  She feels the going into the lateral back of the thighs and anterior lateral legs.  She has found injections helpful in the past.  She gets about 80% relief in the last 4 3 to 4 months.  Pain has returned since her last injection.  She has found PT helpful in the past as well.  She is not interested in any kind of surgery.  No bowel or bladder incontinence.  No saddle anesthesia.   Treatments tried: PT, Tylenol , acupuncture, NSAIDs, lyrica , lumbar steroid injection     Physical Exam:   General: no acute distress, appears stated age Neurologic: alert, answering questions appropriately,  following commands Respiratory: unlabored breathing on room air, symmetric chest rise Psychiatric: appropriate affect, normal cadence to speech     MSK (spine):   -Strength exam                                                   Left                  Right EHL                              5/5                  5/5 TA                                 5/5                  5/5 GSC                             5/5                  5/5 Knee extension            5/5  5/5 Hip flexion                    5/5                  5/5   -Sensory exam                           Sensation intact to light touch in L3-S1 nerve distributions of bilateral lower extremities     Imaging: XRs of the lumbar spine from 06/05/2023 were previously independently reviewed and interpreted, showing a spondylolisthesis at L4/5 that shifts about 2.64mm between flexion and extension views. Disc height loss at L4/5. No other significant degenerative changes seen. No fracture or dislocation seen. PI of 75, LL of 73   MRI of the lumbar spine from 08/24/2023 was previously independent reviewed and interpreted, showing spondylolisthesis at L4/5.  Bilateral foraminal stenosis at L4/5.  DDD at L4/5 with laminectomy defect at that level.  Hypertrophic facets with lateral recess stenosis at L4/5 (R>L).  No other significant stenosis seen.     Patient name: Kendra Rodriguez Patient MRN: [id number] Date of visit: 09/26/24

## 2024-10-06 ENCOUNTER — Other Ambulatory Visit: Payer: Self-pay

## 2024-10-06 ENCOUNTER — Ambulatory Visit: Admitting: Physician Assistant

## 2024-10-06 DIAGNOSIS — M542 Cervicalgia: Secondary | ICD-10-CM

## 2024-10-06 DIAGNOSIS — M25511 Pain in right shoulder: Secondary | ICD-10-CM

## 2024-10-06 DIAGNOSIS — G8929 Other chronic pain: Secondary | ICD-10-CM

## 2024-10-06 NOTE — Progress Notes (Signed)
 Office Visit Note   Patient: Kendra Rodriguez           Date of Birth: [birth year]           MRN: [id number] Visit Date: 10/06/2024              Requested by: Purcell Emil Schanz, MD [address] [place],  [place] [address] PCP: Purcell Emil Schanz, MD   Assessment & Plan: Visit Diagnoses:  1. Chronic right shoulder pain   2. Neck pain     Plan: Patient is a pleasant 58 year old woman who is seen with the help of an interpreter today.  She comes in today with a chief complaint of chronic right shoulder pains going into her neck.  I think on exam and x-ray I think most of the difficulties she is having is with her right shoulder.  She is neurologically intact without any radiculopathy.  I am recommending physical therapy as well as an injection she would like to go forward with this I will follow-up with her in 1 month  Follow-Up Instructions: Return in about 1 month (around 11/06/2024).   Orders:  Orders Placed This Encounter  Procedures   XR Shoulder Right   XR Cervical Spine 2 or 3 views   Ambulatory referral to Physical Therapy   No orders of the defined types were placed in this encounter.     Procedures: Large Joint Inj: R subacromial bursa on 10/07/2024 8:05 AM Indications: diagnostic evaluation and pain Details: 1.5 in posterior approach  Arthrogram: No  Medications: 4 mL lidocaine  1 %; 80 mg methylPREDNISolone  acetate 40 MG/ML; 4 mL bupivacaine  0.25 % Outcome: tolerated well, no immediate complications Procedure, treatment alternatives, risks and benefits explained, specific risks discussed. Consent was given by the patient.       Clinical Data: No additional findings.   Subjective: Chief Complaint  Patient presents with   Right Shoulder - Pain   Neck - Pain    HPI patient is a pleasant 58 year old woman who is accompanied by a Spanish interpreter today.  She is complaining of right shoulder pain and pain in the back of her neck.  She has been  seen by Dr. Nitka for her neck in the past.  No known injury.  She does get numbness in her hand she has done dry needling in the past.  She gets popping in her neck shoulder area when using her hand and arm she is right-hand dominant she has been using Tylenol  and Voltaren  gel  Review of Systems  All other systems reviewed and are negative.    Objective: Vital Signs: There were no vitals taken for this visit.  Physical Exam Constitutional:      Appearance: Normal appearance.  Pulmonary:     Effort: Pulmonary effort is normal.  Skin:    General: Skin is warm and dry.  Neurological:     General: No focal deficit present.     Mental Status: She is alert and oriented to person, place, and time.  Psychiatric:        Mood and Affect: Mood normal.        Behavior: Behavior normal.     Ortho Exam Examination her neck she is no radiculopathy no reduction of symptoms with range of motion of her neck with her right shoulder she has pain with forward elevation internal rotation behind her back strength is 5 out of 5 with abduction external and internal rotation.  She has a positive  empty can test mildly positive speeds test Specialty Comments:  MRI LUMBAR SPINE WITHOUT CONTRAST   TECHNIQUE: Multiplanar, multisequence MR imaging of the lumbar spine was performed. No intravenous contrast was administered.   COMPARISON:  08/07/2017   FINDINGS: Segmentation:  Standard.   Alignment:  Mild retrolisthesis at L3-4 and anterolisthesis at L4-5.   Vertebrae:  No fracture, evidence of discitis, or bone lesion.   Conus medullaris and cauda equina: Conus extends to the T12-L1 level. Conus and cauda equina appear normal.   Paraspinal and other soft tissues: No perispinal mass or inflammation. Postoperative scarring and atrophy at L3-4.   Disc levels:   L3-L4: Mild disc desiccation and narrowing with bulge. Mild facet spurring greater on the left.   L4-L5: Facet osteoarthritis with bulky  spurring and anterolisthesis. Level of greatest degenerative disc narrowing. Right subarticular recess narrowing impinging on the right L5 nerve root, chronic. Foraminal narrowing that is noncompressive on the right.   L5-S1:Degenerative facet spurring. Asymmetric right disc bulging and endplate ridging with mild right foraminal stenosis.   IMPRESSION: 1. Lower lumbar spine degeneration especially affecting facets with L4-5 anterolisthesis. 2. L4-5 right subarticular recess narrowing with impingement of the right L5 nerve root, also seen in 2018. 3. Noncompressive right foraminal narrowing at L4-5 and L5-S1.     Electronically Signed   By: Dorn Roulette M.D.   On: 09/18/2023 07:02  Imaging: No results found.   PMFS History: Patient Active Problem List   Diagnosis Date Noted   Sinus headache 07/26/2024   Bacterial sinusitis 05/16/2024   Sinus congestion 05/16/2024   Raynaud's syndrome with gangrene (HCC) 09/15/2023   Chronic pain of left knee 07/30/2023   Chronic pelvic pain in female 03/09/2023   Chronic bilateral low back pain without sciatica 03/09/2023   Primary osteoarthritis of left knee 09/08/2022   Vitamin D  deficiency 09/08/2022   Hypothyroidism 11/29/2021   Body mass index (BMI) of 35.0-35.9 in adult 06/04/2020   History of migraine headaches 12/06/2019   Dyslipidemia 06/30/2019   GERD with esophagitis 06/30/2019   Chronic vertigo 04/13/2018   Prediabetes 10/17/2015   Spinal stenosis, lumbar region, with neurogenic claudication 10/24/2014   Past Medical History:  Diagnosis Date   Acute sinusitis    Allergy    Anxiety    Depression    NO MEDICATIONS - DOING OK NOW   Dizziness    Ear ache    RIGHT --HX OF MULTIPLE EAR INFECTIONS AS A CHILD    GERD (gastroesophageal reflux disease)    Headache(784.0)    MIGRAINES   Leg pain, left    with swelling   Lumbar stenosis    PAIN BACK AND LEFT LEG AND NUMBNESS LEG AND FOOT   Peripheral vascular disease     HX OF TREATMENT FOR VARICOSE VEINS RIGHT LEG   Thyroid  disease    TOLD BLOOD LEVELS SLIGHTLY ABNORMAL - BUT NOT REQUIRED MEDICATION    Family History  Problem Relation Age of Onset   Hypertension Mother    Arthritis Mother    Diabetes Mother    Hyperlipidemia Mother    Arthritis Sister    Rheum arthritis Brother    Autoimmune disease Brother    Lung disease Brother    Arthritis Sister    Gout Son    High Cholesterol Daughter    Thyroid  disease Neg Hx     Past Surgical History:  Procedure Laterality Date   BACK SURGERY     bladder lift  AT SAME TIME OF HYSTERECTOMY   BRAVO PH STUDY N/A 07/26/2021   Procedure: BRAVO PH STUDY;  Surgeon: Rollin Dover, MD;  Location: [facility];  Service: Endoscopy;  Laterality: N/A;   CHOLECYSTECTOMY     ESOPHAGOGASTRODUODENOSCOPY (EGD) WITH PROPOFOL  N/A 07/26/2021   Procedure: ESOPHAGOGASTRODUODENOSCOPY (EGD) WITH PROPOFOL ;  Surgeon: Rollin Dover, MD;  Location: [facility];  Service: Endoscopy;  Laterality: N/A;   LUMBAR LAMINECTOMY/DECOMPRESSION MICRODISCECTOMY Left 10/24/2014   Procedure: COMPLETE LUMBAR DECOMPRESSION L4-L5 CENTRAL AND MICRODISCECTOMY L4-L5 LEFT;  Surgeon: Tanda DELENA Heading, MD;  Location: [facility];  Service: Orthopedics;  Laterality: Left;   POLYPECTOMY  07/26/2021   Procedure: POLYPECTOMY;  Surgeon: Rollin Dover, MD;  Location: [facility];  Service: Endoscopy;;   VAGINAL HYSTERECTOMY     Social History   Occupational History   Not on file  Tobacco Use   Smoking status: Never   Smokeless tobacco: Never  Vaping Use   Vaping status: Never Used  Substance and Sexual Activity   Alcohol use: No   Drug use: Never   Sexual activity: Yes

## 2024-10-07 DIAGNOSIS — M25511 Pain in right shoulder: Secondary | ICD-10-CM

## 2024-10-07 DIAGNOSIS — G8929 Other chronic pain: Secondary | ICD-10-CM

## 2024-10-07 MED ORDER — METHYLPREDNISOLONE ACETATE 40 MG/ML IJ SUSP
80.0000 mg | INTRAMUSCULAR | Status: AC | PRN
  Administered 2024-10-07: 80 mg via INTRA_ARTICULAR

## 2024-10-07 MED ORDER — LIDOCAINE HCL 1 % IJ SOLN
4.0000 mL | INTRAMUSCULAR | Status: AC | PRN
  Administered 2024-10-07: 4 mL

## 2024-10-07 MED ORDER — BUPIVACAINE HCL 0.25 % IJ SOLN
4.0000 mL | INTRAMUSCULAR | Status: AC | PRN
  Administered 2024-10-07: 4 mL via INTRA_ARTICULAR

## 2024-10-13 ENCOUNTER — Ambulatory Visit

## 2024-10-26 ENCOUNTER — Ambulatory Visit

## 2024-10-28 ENCOUNTER — Other Ambulatory Visit: Payer: Self-pay

## 2024-10-28 ENCOUNTER — Ambulatory Visit: Attending: Physician Assistant

## 2024-10-28 DIAGNOSIS — M5412 Radiculopathy, cervical region: Secondary | ICD-10-CM | POA: Insufficient documentation

## 2024-10-28 DIAGNOSIS — G8929 Other chronic pain: Secondary | ICD-10-CM | POA: Diagnosis present

## 2024-10-28 DIAGNOSIS — M25511 Pain in right shoulder: Secondary | ICD-10-CM | POA: Insufficient documentation

## 2024-10-28 DIAGNOSIS — M542 Cervicalgia: Secondary | ICD-10-CM | POA: Diagnosis present

## 2024-10-28 NOTE — Therapy (Signed)
 " OUTPATIENT PHYSICAL THERAPY UPPER EXTREMITY EVALUATION   Patient Name: Kendra Rodriguez MRN: [id number] DOB:Mar 24, 1966, 58 y.o., female Today's Date: 10/28/2024  END OF SESSION:  PT End of Session - 10/28/24 0834     Visit Number 1    Authorization Type [employer] MCD    PT Start Time 0832    PT Stop Time 0910    PT Time Calculation (min) 38 min          Past Medical History:  Diagnosis Date   Acute sinusitis    Allergy    Anxiety    Depression    NO MEDICATIONS - DOING OK NOW   Dizziness    Ear ache    RIGHT --HX OF MULTIPLE EAR INFECTIONS AS A CHILD    GERD (gastroesophageal reflux disease)    Headache(784.0)    MIGRAINES   Leg pain, left    with swelling   Lumbar stenosis    PAIN BACK AND LEFT LEG AND NUMBNESS LEG AND FOOT   Peripheral vascular disease    HX OF TREATMENT FOR VARICOSE VEINS RIGHT LEG   Thyroid  disease    TOLD BLOOD LEVELS SLIGHTLY ABNORMAL - BUT NOT REQUIRED MEDICATION   Past Surgical History:  Procedure Laterality Date   BACK SURGERY     bladder lift     AT SAME TIME OF HYSTERECTOMY   BRAVO PH STUDY N/A 07/26/2021   Procedure: BRAVO PH STUDY;  Surgeon: Rollin Dover, MD;  Location: [facility];  Service: Endoscopy;  Laterality: N/A;   CHOLECYSTECTOMY     ESOPHAGOGASTRODUODENOSCOPY (EGD) WITH PROPOFOL  N/A 07/26/2021   Procedure: ESOPHAGOGASTRODUODENOSCOPY (EGD) WITH PROPOFOL ;  Surgeon: Rollin Dover, MD;  Location: [facility];  Service: Endoscopy;  Laterality: N/A;   LUMBAR LAMINECTOMY/DECOMPRESSION MICRODISCECTOMY Left 10/24/2014   Procedure: COMPLETE LUMBAR DECOMPRESSION L4-L5 CENTRAL AND MICRODISCECTOMY L4-L5 LEFT;  Surgeon: Tanda DELENA Heading, MD;  Location: [facility];  Service: Orthopedics;  Laterality: Left;   POLYPECTOMY  07/26/2021   Procedure: POLYPECTOMY;  Surgeon: Rollin Dover, MD;  Location: [facility];  Service: Endoscopy;;   VAGINAL HYSTERECTOMY     Patient Active Problem List   Diagnosis Date Noted   Sinus headache  07/26/2024   Bacterial sinusitis 05/16/2024   Sinus congestion 05/16/2024   Raynaud's syndrome with gangrene (HCC) 09/15/2023   Chronic pain of left knee 07/30/2023   Chronic pelvic pain in female 03/09/2023   Chronic bilateral low back pain without sciatica 03/09/2023   Primary osteoarthritis of left knee 09/08/2022   Vitamin D  deficiency 09/08/2022   Hypothyroidism 11/29/2021   Body mass index (BMI) of 35.0-35.9 in adult 06/04/2020   History of migraine headaches 12/06/2019   Dyslipidemia 06/30/2019   GERD with esophagitis 06/30/2019   Chronic vertigo 04/13/2018   Prediabetes 10/17/2015   Spinal stenosis, lumbar region, with neurogenic claudication 10/24/2014    PCP: Purcell Emil Schanz, MD  REFERRING PROVIDER: Persons, Ronal Dragon, [place]  REFERRING DIAG: [phone] (ICD-10-CM) - Chronic right shoulder pain  THERAPY DIAG:  Chronic right shoulder pain  Neck pain  Radiculopathy, cervical region  Rationale for Evaluation and Treatment: Rehabilitation  ONSET DATE: 1 month ago  SUBJECTIVE:  SUBJECTIVE STATEMENT: Interpreter present Pt reports her shoulder pain started 1 month ago without trauma. She notes this pain goes into her elbow and hand with occasional R hand numbness. She had a CSI on 12/04 which has not changed her symptoms. After further questioning, the pt has chronic neck pain (R>L) that has worsened since the R shoulder pain.   Hand dominance: Right  PERTINENT HISTORY: Vitamin D  deficiency, migraines, anxiety, depression  PAIN:  Are you having pain? Yes: NPRS scale: not rated Pain location: R shoulder and neck Pain description: ache Aggravating factors: movement Relieving factors: heat, arthritis cream  PRECAUTIONS: None  RED FLAGS: None   WEIGHT BEARING RESTRICTIONS:  No  FALLS:  Has patient fallen in last 6 months? No  LIVING ENVIRONMENT: Lives with: lives with their family Lives in: House/apartment Stairs: did not ask Has following equipment at home: None  OCCUPATION: Did not ask  PLOF: Independent  PATIENT GOALS: less pain  NEXT MD VISIT: 11/08/2024  OBJECTIVE:  Note: Objective measures were completed at Evaluation unless otherwise noted.  DIAGNOSTIC FINDINGS:  Xray C-spine: No acute changes.  Slight straightening of the normal lordotic curve.   Some degenerative changes with slight narrowing at C6-C7  Xray R shoulder: No acute osseous changes.  She does have some early degenerative changes of the [facility] joint.  Glenohumeral joint is fairly well-preserved PATIENT SURVEYS :  Quick Dash:  QUICK DASH  Please rate your ability do the following activities in the last week by selecting the number below the appropriate response.   Activities Rating  Open a tight or new jar.  2 = Mild difficulty  Do heavy household chores (e.g., wash walls, floors). 4 = Severe difficulty  Carry a shopping bag or briefcase 2 = Mild difficulty  Wash your back. 3 = Moderate difficulty  Use a knife to cut food. 2 = Mild difficulty  Recreational activities in which you take some force or impact through your arm, shoulder or hand (e.g., golf, hammering, tennis, etc.). 2 = Mild difficulty  During the past week, to what extent has your arm, shoulder or hand problem interfered with your normal social activities with family, friends, neighbors or groups?  5 = Extremely  During the past week, were you limited in your work or other regular daily activities as a result of your arm, shoulder or hand problem? 4 = Very limited  Rate the severity of the following symptoms in the last week: Arm, Shoulder, or hand pain. 4 = Severe  Rate the severity of the following symptoms in the last week: Tingling (pins and needles) in your arm, shoulder or hand. 3 = Moderate  During the  past week, how much difficulty have you had sleeping because of the pain in your arm, shoulder or hand?  4 = Severe difficulty   (A QuickDASH score may not be calculated if there is greater than 1 missing item.)  Quick Dash Disability/Symptom Score: [(sum of 35 (n) responses/11 (n)] x 25 = 79  Minimally Clinically Important Difference (MCID): 15-20 points  (Franchignoni, F. et al. (2013). Minimally clinically important difference of the disabilities of the arm, shoulder, and hand outcome measures (DASH) and its shortened version (Quick DASH). Journal of Orthopaedic & Sports Physical Therapy, 44(1), 30-39)   COGNITION: Overall cognitive status: Within functional limits for tasks assessed     SENSATION: Not tested  POSTURE: Protracted scapulas, forward head, rounded shoulders  CERVICAL ROM:   Active ROM A/PROM (deg) eval  Flexion WNL*  Extension WNL*  Right lateral flexion WNL*  Left lateral flexion WNL*  Right rotation WNL  Left rotation WNL*   (Blank rows = not tested)(* = pain)    UPPER EXTREMITY ROM:   Active/P ROM Right eval Left eval  Shoulder flexion 100* PROM 105*   Shoulder extension    Shoulder abduction 90   Shoulder adduction    Shoulder internal rotation    Shoulder external rotation 40* PROM 80*   Elbow flexion    Elbow extension    Wrist flexion    Wrist extension    Wrist ulnar deviation    Wrist radial deviation    Wrist pronation    Wrist supination    (Blank rows = not tested)(* = pain)   UPPER EXTREMITY MMT: deferred due to limited ROM and pain  MMT Right eval Left eval  Shoulder flexion    Shoulder extension    Shoulder abduction    Shoulder adduction    Shoulder internal rotation    Shoulder external rotation    Middle trapezius    Lower trapezius    Elbow flexion    Elbow extension    Wrist flexion    Wrist extension    Wrist ulnar deviation    Wrist radial deviation    Wrist pronation    Wrist supination    Grip strength  (lbs)    (Blank rows = not tested)  JOINT MOBILITY TESTING:  Pain with inferior glenohumeral jt mobs; no pain with posterior  Pain with inferior and posterior AC jt mobs  PALPATION:  Tenderness over R anterior shoulder, AC joint, and distal clavicle    CERVICAL SPECIAL TESTS:  Spurling's test: Positive and Distraction test: Positive                                                                                                                             TREATMENT DATE:  [facility] Adult PT Treatment:                                                DATE: 10/28/2024   Initial evaluation: see patient education and home exercise program as noted below     PATIENT EDUCATION: Education details: POC, HEP, diagnosis, prognosis.  Person educated: Patient Education method: Explanation, Demonstration, Tactile cues, Verbal cues, and Handouts Education comprehension: verbalized understanding, returned demonstration, verbal cues required, tactile cues required, and needs further education  HOME EXERCISE PROGRAM: Access Code: [email or web address] URL: [email or web address] Date: 10/28/2024 Prepared by: Marijo Berber  Exercises - Seated Cervical Retraction  - 1-2 x daily - 7 x weekly - 3 sets - 10 reps - 5sec hold - Seated Scapular Retraction  - 1-2 x daily - 7 x weekly - 3 sets - 10 reps - 5sec hold - Seated Assisted Cervical Rotation with Towel  -  1 x daily - 7 x weekly - 1 sets - 10 reps - 5sec hold - Standing Isometric Shoulder Abduction with Doorway - Arm Bent  - 1 x daily - 7 x weekly - 2 sets - 10 reps - 5sec hold  ASSESSMENT:  CLINICAL IMPRESSION: Patient is a 58 y.o. F who was seen today for physical therapy evaluation and treatment for R shoulder pain. The pt demonstrates limited and painful R shoulder ROM, weakness, painful cervical ROM, and postural dysfunction. With screening of the cervical spine, the pts main complaint of shoulder pain is likely being contributed by the cervical  region. Her radiating pain is also likely due to her cervical spine. The pts functional limitations include carrying, lifting, reaching, and ADLs. The pt will benefit from skilled physical therapy to decrease pain and increase function.     OBJECTIVE IMPAIRMENTS: decreased ROM, decreased strength, hypomobility, impaired UE functional use, improper body mechanics, postural dysfunction, obesity, and pain.   ACTIVITY LIMITATIONS: carrying, lifting, sleeping, bed mobility, bathing, toileting, dressing, reach over head, and hygiene/grooming  PARTICIPATION LIMITATIONS: shopping, community activity, and yard work  PERSONAL FACTORS: Time since onset of injury/illness/exacerbation and 3+ comorbidities: Vitamin D  deficiency, migraines, anxiety, depression are also affecting patient's functional outcome.   REHAB POTENTIAL: Fair due to above listed factors  CLINICAL DECISION MAKING: Evolving/moderate complexity  EVALUATION COMPLEXITY: Moderate  GOALS: Goals reviewed with patient? Yes  SHORT TERM GOALS: Target date: 11/25/2024  Pt will be compliant and independent with HEP to assist with symptom management/recovery at home.  Baseline: [email or web address] Goal status: INITIAL  2.  Pt will report no symptoms below her elbow for at least 1 week.  Baseline: symptoms below elbow  Goal status: INITIAL  3.  Pt will demonstrate at least 120 degrees AROM flexion and ABD. Baseline:  Active/P ROM Right eval  Shoulder flexion 100* PROM 105*  Shoulder extension   Shoulder abduction 90  Shoulder adduction   Shoulder internal rotation   Shoulder external rotation 40* PROM 80*   Goal status: INITIAL   LONG TERM GOALS: Target date: 12/23/2024  Pt will demonstrate at least 160 degrees AROM flexion and ABD. Baseline:  Active/P ROM Right eval  Shoulder flexion 100* PROM 105*  Shoulder extension   Shoulder abduction 90  Shoulder adduction   Shoulder internal rotation   Shoulder external rotation  40* PROM 80*   Goal status: INITIAL  2.  Pt will report pain no greater than 4/10 for at least 1 week. Baseline: 8/10  Goal status: INITIAL  3.  Pt will demonstrate 4/5 strength throughout the R shoulder.  Baseline: see objective Goal status: INITIAL  4.  Pt will be comfortable with her final HEP in order to continue any symptom management at home and to avoid regression.   Baseline:  [email or web address] Goal status: INITIAL   PLAN: PT FREQUENCY: 1-2x/week  PT DURATION: 8 weeks  PLANNED INTERVENTIONS: 97110-Therapeutic exercises, 97530- Therapeutic activity, [id number]- Neuromuscular re-education, 97535- Self Care, [id number]- Manual therapy, and Patient/Family education  PLAN FOR NEXT SESSION: cervical stability, cervical and shoulder manual, shoulder ROM, shoulder stability. HEP review.    Marijo DELENA Berber, PT 10/28/2024, 9:45 AM  "

## 2024-11-01 ENCOUNTER — Telehealth: Payer: Self-pay | Admitting: Physician Assistant

## 2024-11-01 NOTE — Telephone Encounter (Signed)
 Pt's daughter called wanting to know if her mom still needs to come to her apt since she started therapy. Call back number is [phone].

## 2024-11-07 ENCOUNTER — Ambulatory Visit (INDEPENDENT_AMBULATORY_CARE_PROVIDER_SITE_OTHER): Admitting: Physical Medicine and Rehabilitation

## 2024-11-07 ENCOUNTER — Other Ambulatory Visit: Payer: Self-pay

## 2024-11-07 VITALS — BP 124/78 | HR 80

## 2024-11-07 DIAGNOSIS — M5416 Radiculopathy, lumbar region: Secondary | ICD-10-CM

## 2024-11-07 MED ORDER — METHYLPREDNISOLONE ACETATE 40 MG/ML IJ SUSP
40.0000 mg | Freq: Once | INTRAMUSCULAR | Status: AC
  Administered 2024-11-07: 40 mg

## 2024-11-07 NOTE — Procedures (Signed)
 Lumbosacral Transforaminal Epidural Steroid Injection - Sub-Pedicular Approach with Fluoroscopic Guidance  Patient: Kendra Rodriguez      Date of Birth: 1965-12-05 MRN: [id number] PCP: Purcell Emil Schanz, MD      Visit Date: 11/07/2024   Universal Protocol:    Date/Time: 11/07/2024  Consent Given By: the patient  Position: PRONE  Additional Comments: Vital signs were monitored before and after the procedure. Patient was prepped and draped in the usual sterile fashion. The correct patient, procedure, and site was verified.   Injection Procedure Details:   Procedure diagnoses: Lumbar radiculopathy [M54.16]    Meds Administered:  Meds ordered this encounter  Medications   methylPREDNISolone  acetate (DEPO-MEDROL ) injection 40 mg    Laterality: Right  Location/Site: L4  Needle:5.0 in., 22 ga.  Short bevel or Quincke spinal needle  Needle Placement: Transforaminal  Findings:    -Comments: Excellent flow of contrast along the nerve, nerve root and into the epidural space.  Procedure Details: After squaring off the end-plates to get a true AP view, the C-arm was positioned so that an oblique view of the foramen as noted above was visualized. The target area is just inferior to the nose of the scotty dog or sub pedicular. The soft tissues overlying this structure were infiltrated with 2-3 ml. of 1% Lidocaine  without Epinephrine .  The spinal needle was inserted toward the target using a trajectory view along the fluoroscope beam.  Under AP and lateral visualization, the needle was advanced so it did not puncture dura and was located close the 6 O'Clock position of the pedical in AP tracterory. Biplanar projections were used to confirm position. Aspiration was confirmed to be negative for CSF and/or blood. A 1-2 ml. volume of Isovue -250 was injected and flow of contrast was noted at each level. Radiographs were obtained for documentation purposes.   After attaining the desired  flow of contrast documented above, a 0.5 to 1.0 ml test dose of 0.25% Marcaine  was injected into each respective transforaminal space.  The patient was observed for 90 seconds post injection.  After no sensory deficits were reported, and normal lower extremity motor function was noted,   the above injectate was administered so that equal amounts of the injectate were placed at each foramen (level) into the transforaminal epidural space.   Additional Comments:  The patient tolerated the procedure well Dressing: 2 x 2 sterile gauze and Band-Aid    Post-procedure details: Patient was observed during the procedure. Post-procedure instructions were reviewed.  Patient left the clinic in stable condition.

## 2024-11-07 NOTE — Progress Notes (Signed)
 "  MONTSERRATH Rodriguez - 59 y.o. female MRN [id number]  Date of birth: Nov 04, 1965  Office Visit Note: Visit Date: 11/07/2024 PCP: Purcell Emil Schanz, MD Referred by: Purcell Emil Schanz, MD  Subjective: Chief Complaint  Patient presents with   Lower Back - Pain   HPI:  Kendra Rodriguez is a 59 y.o. female who comes in today at the request of Dr. Ozell Ada for planned Right L4-5 Lumbar Transforaminal epidural steroid injection with fluoroscopic guidance.  The patient has failed conservative care including home exercise, medications, time and activity modification.  This injection will be diagnostic and hopefully therapeutic.  Please see requesting physician notes for further details and justification.   ROS Otherwise per HPI.  Assessment & Plan: Visit Diagnoses:    ICD-10-CM   1. Lumbar radiculopathy  M54.16 XR C-ARM NO REPORT    Epidural Steroid injection    methylPREDNISolone  acetate (DEPO-MEDROL ) injection 40 mg      Plan: No additional findings.   Meds & Orders:  Meds ordered this encounter  Medications   methylPREDNISolone  acetate (DEPO-MEDROL ) injection 40 mg    Orders Placed This Encounter  Procedures   XR C-ARM NO REPORT   Epidural Steroid injection    Follow-up: Return for visit to requesting provider as needed.   Procedures: No procedures performed  Lumbosacral Transforaminal Epidural Steroid Injection - Sub-Pedicular Approach with Fluoroscopic Guidance  Patient: Kendra Rodriguez      Date of Birth: [birth year] MRN: [id number] PCP: Purcell Emil Schanz, MD      Visit Date: 11/07/2024   Universal Protocol:    Date/Time: 11/07/2024  Consent Given By: the patient  Position: PRONE  Additional Comments: Vital signs were monitored before and after the procedure. Patient was prepped and draped in the usual sterile fashion. The correct patient, procedure, and site was verified.   Injection Procedure Details:   Procedure diagnoses: Lumbar radiculopathy  [M54.16]    Meds Administered:  Meds ordered this encounter  Medications   methylPREDNISolone  acetate (DEPO-MEDROL ) injection 40 mg    Laterality: Right  Location/Site: L4  Needle:5.0 in., 22 ga.  Short bevel or Quincke spinal needle  Needle Placement: Transforaminal  Findings:    -Comments: Excellent flow of contrast along the nerve, nerve root and into the epidural space.  Procedure Details: After squaring off the end-plates to get a true AP view, the C-arm was positioned so that an oblique view of the foramen as noted above was visualized. The target area is just inferior to the nose of the scotty dog or sub pedicular. The soft tissues overlying this structure were infiltrated with 2-3 ml. of 1% Lidocaine  without Epinephrine .  The spinal needle was inserted toward the target using a trajectory view along the fluoroscope beam.  Under AP and lateral visualization, the needle was advanced so it did not puncture dura and was located close the 6 O'Clock position of the pedical in AP tracterory. Biplanar projections were used to confirm position. Aspiration was confirmed to be negative for CSF and/or blood. A 1-2 ml. volume of Isovue -250 was injected and flow of contrast was noted at each level. Radiographs were obtained for documentation purposes.   After attaining the desired flow of contrast documented above, a 0.5 to 1.0 ml test dose of 0.25% Marcaine  was injected into each respective transforaminal space.  The patient was observed for 90 seconds post injection.  After no sensory deficits were reported, and normal lower extremity motor function was noted,   the above  injectate was administered so that equal amounts of the injectate were placed at each foramen (level) into the transforaminal epidural space.   Additional Comments:  The patient tolerated the procedure well Dressing: 2 x 2 sterile gauze and Band-Aid    Post-procedure details: Patient was observed during the  procedure. Post-procedure instructions were reviewed.  Patient left the clinic in stable condition.    Clinical History: MRI LUMBAR SPINE WITHOUT CONTRAST   TECHNIQUE: Multiplanar, multisequence MR imaging of the lumbar spine was performed. No intravenous contrast was administered.   COMPARISON:  08/07/2017   FINDINGS: Segmentation:  Standard.   Alignment:  Mild retrolisthesis at L3-4 and anterolisthesis at L4-5.   Vertebrae:  No fracture, evidence of discitis, or bone lesion.   Conus medullaris and cauda equina: Conus extends to the T12-L1 level. Conus and cauda equina appear normal.   Paraspinal and other soft tissues: No perispinal mass or inflammation. Postoperative scarring and atrophy at L3-4.   Disc levels:   L3-L4: Mild disc desiccation and narrowing with bulge. Mild facet spurring greater on the left.   L4-L5: Facet osteoarthritis with bulky spurring and anterolisthesis. Level of greatest degenerative disc narrowing. Right subarticular recess narrowing impinging on the right L5 nerve root, chronic. Foraminal narrowing that is noncompressive on the right.   L5-S1:Degenerative facet spurring. Asymmetric right disc bulging and endplate ridging with mild right foraminal stenosis.   IMPRESSION: 1. Lower lumbar spine degeneration especially affecting facets with L4-5 anterolisthesis. 2. L4-5 right subarticular recess narrowing with impingement of the right L5 nerve root, also seen in 2018. 3. Noncompressive right foraminal narrowing at L4-5 and L5-S1.     Electronically Signed   By: Dorn Roulette M.D.   On: 09/18/2023 07:02     Objective:  VS:  HT:    WT:   BMI:     BP:124/78  HR:80bpm  TEMP: ( )  RESP:  Physical Exam Vitals and nursing note reviewed.  Constitutional:      General: She is not in acute distress.    Appearance: Normal appearance. She is not ill-appearing.  HENT:     Head: Normocephalic and atraumatic.     Right Ear: External ear  normal.     Left Ear: External ear normal.  Eyes:     Extraocular Movements: Extraocular movements intact.  Cardiovascular:     Rate and Rhythm: Normal rate.     Pulses: Normal pulses.  Pulmonary:     Effort: Pulmonary effort is normal. No respiratory distress.  Abdominal:     General: There is no distension.     Palpations: Abdomen is soft.  Musculoskeletal:        General: Tenderness present.     Cervical back: Neck supple.     Right lower leg: No edema.     Left lower leg: No edema.     Comments: Patient has good distal strength with no pain over the greater trochanters.  No clonus or focal weakness.  Skin:    Findings: No erythema, lesion or rash.  Neurological:     General: No focal deficit present.     Mental Status: She is alert and oriented to person, place, and time.     Sensory: No sensory deficit.     Motor: No weakness or abnormal muscle tone.     Coordination: Coordination normal.  Psychiatric:        Mood and Affect: Mood normal.        Behavior: Behavior normal.  Imaging: No results found. "

## 2024-11-07 NOTE — Progress Notes (Signed)
 Pain Scale   Average Pain 7  Patient advising she has chronic lower back pain radiating to right leg pain increases when sitting and decreases when walking        +Driver, -BT, -Dye Allergies.

## 2024-11-08 ENCOUNTER — Ambulatory Visit: Admitting: Physician Assistant

## 2024-11-09 ENCOUNTER — Encounter: Payer: Self-pay | Admitting: Emergency Medicine

## 2024-11-09 ENCOUNTER — Ambulatory Visit

## 2024-11-09 NOTE — Telephone Encounter (Signed)
 Tamiflu  not recommended without symptoms.  Her husband has the flu.  If she starts getting symptoms then I will prescribe it.  If not, no need to take it for prophylaxis.

## 2024-11-10 ENCOUNTER — Ambulatory Visit: Attending: Emergency Medicine | Admitting: Physical Therapy

## 2024-11-10 DIAGNOSIS — M25511 Pain in right shoulder: Secondary | ICD-10-CM | POA: Insufficient documentation

## 2024-11-10 DIAGNOSIS — G8929 Other chronic pain: Secondary | ICD-10-CM | POA: Insufficient documentation

## 2024-11-10 DIAGNOSIS — M542 Cervicalgia: Secondary | ICD-10-CM | POA: Insufficient documentation

## 2024-11-10 DIAGNOSIS — M5412 Radiculopathy, cervical region: Secondary | ICD-10-CM | POA: Insufficient documentation

## 2024-11-10 DIAGNOSIS — M5416 Radiculopathy, lumbar region: Secondary | ICD-10-CM | POA: Insufficient documentation

## 2024-11-10 NOTE — Therapy (Signed)
 " OUTPATIENT PHYSICAL THERAPY UPPER EXTREMITY EVALUATION   Patient Name: Kendra Rodriguez MRN: [id number] DOB:[birth year], 59 y.o., female Today's Date: 11/10/2024  END OF SESSION:  PT End of Session - 11/10/24 0939     Visit Number 2    Number of Visits 16    Date for Recertification  12/23/24    Authorization Type [employer] MCD    PT Start Time 0937    PT Stop Time 1018    PT Time Calculation (min) 41 min    Behavior During Therapy WFL for tasks assessed/performed           Past Medical History:  Diagnosis Date   Acute sinusitis    Allergy    Anxiety    Depression    NO MEDICATIONS - DOING OK NOW   Dizziness    Ear ache    RIGHT --HX OF MULTIPLE EAR INFECTIONS AS A CHILD    GERD (gastroesophageal reflux disease)    Headache(784.0)    MIGRAINES   Leg pain, left    with swelling   Lumbar stenosis    PAIN BACK AND LEFT LEG AND NUMBNESS LEG AND FOOT   Peripheral vascular disease    HX OF TREATMENT FOR VARICOSE VEINS RIGHT LEG   Thyroid  disease    TOLD BLOOD LEVELS SLIGHTLY ABNORMAL - BUT NOT REQUIRED MEDICATION   Past Surgical History:  Procedure Laterality Date   BACK SURGERY     bladder lift     AT SAME TIME OF HYSTERECTOMY   BRAVO PH STUDY N/A 07/26/2021   Procedure: BRAVO PH STUDY;  Surgeon: Rollin Dover, MD;  Location: [facility];  Service: Endoscopy;  Laterality: N/A;   CHOLECYSTECTOMY     ESOPHAGOGASTRODUODENOSCOPY (EGD) WITH PROPOFOL  N/A 07/26/2021   Procedure: ESOPHAGOGASTRODUODENOSCOPY (EGD) WITH PROPOFOL ;  Surgeon: Rollin Dover, MD;  Location: [facility];  Service: Endoscopy;  Laterality: N/A;   LUMBAR LAMINECTOMY/DECOMPRESSION MICRODISCECTOMY Left 10/24/2014   Procedure: COMPLETE LUMBAR DECOMPRESSION L4-L5 CENTRAL AND MICRODISCECTOMY L4-L5 LEFT;  Surgeon: Tanda DELENA Heading, MD;  Location: [facility];  Service: Orthopedics;  Laterality: Left;   POLYPECTOMY  07/26/2021   Procedure: POLYPECTOMY;  Surgeon: Rollin Dover, MD;  Location: [facility];   Service: Endoscopy;;   VAGINAL HYSTERECTOMY     Patient Active Problem List   Diagnosis Date Noted   Sinus headache 07/26/2024   Bacterial sinusitis 05/16/2024   Sinus congestion 05/16/2024   Raynaud's syndrome with gangrene (HCC) 09/15/2023   Chronic pain of left knee 07/30/2023   Chronic pelvic pain in female 03/09/2023   Chronic bilateral low back pain without sciatica 03/09/2023   Primary osteoarthritis of left knee 09/08/2022   Vitamin D  deficiency 09/08/2022   Hypothyroidism 11/29/2021   Body mass index (BMI) of 35.0-35.9 in adult 06/04/2020   History of migraine headaches 12/06/2019   Dyslipidemia 06/30/2019   GERD with esophagitis 06/30/2019   Chronic vertigo 04/13/2018   Prediabetes 10/17/2015   Spinal stenosis, lumbar region, with neurogenic claudication 10/24/2014    PCP: Purcell Emil Schanz, MD  REFERRING PROVIDER: Persons, Ronal Dragon, [place]  REFERRING DIAG: [phone] (ICD-10-CM) - Chronic right shoulder pain  THERAPY DIAG:  Chronic right shoulder pain - Plan: PT plan of care cert/re-cert  Neck pain - Plan: PT plan of care cert/re-cert  Radiculopathy, cervical region - Plan: PT plan of care cert/re-cert  Rationale for Evaluation and Treatment: Rehabilitation  ONSET DATE: 1 month ago  SUBJECTIVE:  SUBJECTIVE STATEMENT: 11/10/2024 I am having about 7/10 pain in the shoulder and elbow with the elbow being worse.  Eval: Interpreter present Pt reports her shoulder pain started 1 month ago without trauma. She notes this pain goes into her elbow and hand with occasional R hand numbness. She had a CSI on 12/04 which has not changed her symptoms. After further questioning, the pt has chronic neck pain (R>L) that has worsened since the R shoulder pain.   Hand dominance:  Right  PERTINENT HISTORY: Vitamin D  deficiency, migraines, anxiety, depression  PAIN:  Are you having pain? Yes: NPRS scale: 7/10 Pain location: R shoulder and neck Pain description: ache Aggravating factors: movement Relieving factors: heat, arthritis cream  PRECAUTIONS: None  RED FLAGS: None   WEIGHT BEARING RESTRICTIONS: No  FALLS:  Has patient fallen in last 6 months? No  LIVING ENVIRONMENT: Lives with: lives with their family Lives in: House/apartment Stairs: did not ask Has following equipment at home: None  OCCUPATION: Did not ask  PLOF: Independent  PATIENT GOALS: less pain  NEXT MD VISIT: 11/08/2024  OBJECTIVE:  Note: Objective measures were completed at Evaluation unless otherwise noted.  DIAGNOSTIC FINDINGS:  Xray C-spine: No acute changes.  Slight straightening of the normal lordotic curve.   Some degenerative changes with slight narrowing at C6-C7  Xray R shoulder: No acute osseous changes.  She does have some early degenerative changes of the [facility] joint.  Glenohumeral joint is fairly well-preserved PATIENT SURVEYS :  Quick Dash:  QUICK DASH  Please rate your ability do the following activities in the last week by selecting the number below the appropriate response.   Activities Rating  Open a tight or new jar.  2 = Mild difficulty  Do heavy household chores (e.g., wash walls, floors). 4 = Severe difficulty  Carry a shopping bag or briefcase 2 = Mild difficulty  Wash your back. 3 = Moderate difficulty  Use a knife to cut food. 2 = Mild difficulty  Recreational activities in which you take some force or impact through your arm, shoulder or hand (e.g., golf, hammering, tennis, etc.). 2 = Mild difficulty  During the past week, to what extent has your arm, shoulder or hand problem interfered with your normal social activities with family, friends, neighbors or groups?  5 = Extremely  During the past week, were you limited in your work or other  regular daily activities as a result of your arm, shoulder or hand problem? 4 = Very limited  Rate the severity of the following symptoms in the last week: Arm, Shoulder, or hand pain. 4 = Severe  Rate the severity of the following symptoms in the last week: Tingling (pins and needles) in your arm, shoulder or hand. 3 = Moderate  During the past week, how much difficulty have you had sleeping because of the pain in your arm, shoulder or hand?  4 = Severe difficulty   (A QuickDASH score may not be calculated if there is greater than 1 missing item.)  Quick Dash Disability/Symptom Score: [(sum of 35 (n) responses/11 (n)] x 25 = 79  Minimally Clinically Important Difference (MCID): 15-20 points  (Franchignoni, F. et al. (2013). Minimally clinically important difference of the disabilities of the arm, shoulder, and hand outcome measures (DASH) and its shortened version (Quick DASH). Journal of Orthopaedic & Sports Physical Therapy, 44(1), 30-39)   COGNITION: Overall cognitive status: Within functional limits for tasks assessed     SENSATION: Not tested  POSTURE: Protracted  scapulas, forward head, rounded shoulders  CERVICAL ROM:   Active ROM A/PROM (deg) eval  Flexion WNL*  Extension WNL*  Right lateral flexion WNL*  Left lateral flexion WNL*  Right rotation WNL  Left rotation WNL*   (Blank rows = not tested)(* = pain)    UPPER EXTREMITY ROM:   Active/P ROM Right eval Left eval  Shoulder flexion 100* PROM 105*   Shoulder extension    Shoulder abduction 90   Shoulder adduction    Shoulder internal rotation    Shoulder external rotation 40* PROM 80*   Elbow flexion    Elbow extension    Wrist flexion    Wrist extension    Wrist ulnar deviation    Wrist radial deviation    Wrist pronation    Wrist supination    (Blank rows = not tested)(* = pain)   UPPER EXTREMITY MMT: deferred due to limited ROM and pain  MMT Right eval Left eval  Shoulder flexion    Shoulder  extension    Shoulder abduction    Shoulder adduction    Shoulder internal rotation    Shoulder external rotation    Middle trapezius    Lower trapezius    Elbow flexion    Elbow extension    Wrist flexion    Wrist extension    Wrist ulnar deviation    Wrist radial deviation    Wrist pronation    Wrist supination    Grip strength (lbs)    (Blank rows = not tested)  JOINT MOBILITY TESTING:  Pain with inferior glenohumeral jt mobs; no pain with posterior  Pain with inferior and posterior AC jt mobs  PALPATION:  Tenderness over R anterior shoulder, AC joint, and distal clavicle    CERVICAL SPECIAL TESTS:  Spurling's test: Positive and Distraction test: Positive                                                                                                                             TREATMENT DATE:  [facility] Adult PT Treatment:                                                DATE: 11/10/2024 C3-C7 R lateral gapping mobs grade # Manual traction pt noted relief of sx in RUE MTPR along the R upper trap, levator and cervical paraspinas Supine chin tuck head lift 1 x 10 holding 5 sec Upper cervical flexion with rotation Mechanical Traction  x 10 min , Mx pull 16, min pull 11, x 10 min with hold time of 60 sec x  [facility] Adult PT Treatment:  DATE: 10/28/2024   Initial evaluation: see patient education and home exercise program as noted below     PATIENT EDUCATION: Education details: POC, HEP, diagnosis, prognosis.  Person educated: Patient Education method: Explanation, Demonstration, Tactile cues, Verbal cues, and Handouts Education comprehension: verbalized understanding, returned demonstration, verbal cues required, tactile cues required, and needs further education  HOME EXERCISE PROGRAM: Access Code: [email or web address] URL: [email or web address] Date: 10/28/2024 Prepared by: Marijo Berber  Exercises - Seated Cervical  Retraction  - 1-2 x daily - 7 x weekly - 3 sets - 10 reps - 5sec hold - Seated Scapular Retraction  - 1-2 x daily - 7 x weekly - 3 sets - 10 reps - 5sec hold - Seated Assisted Cervical Rotation with Towel  - 1 x daily - 7 x weekly - 1 sets - 10 reps - 5sec hold - Standing Isometric Shoulder Abduction with Doorway - Arm Bent  - 1 x daily - 7 x weekly - 2 sets - 10 reps - 5sec hold  ASSESSMENT:  CLINICAL IMPRESSION: 11/10/2024 Mrs Wassel arrives to session noting R shoulder and elbow pain at 7/10 describing the elbow is worse than the shoulder. Focused session on cervical spine and surrounding musculature to address potential referral which she noted pain dropped in both the elbow and shoulder from a 7/10 to a 4/10. Educated and performed mechanical traction which she noted continued benefit and relief.    EVAL: Patient is a 60 y.o. F who was seen today for physical therapy evaluation and treatment for R shoulder pain. The pt demonstrates limited and painful R shoulder ROM, weakness, painful cervical ROM, and postural dysfunction. With screening of the cervical spine, the pts main complaint of shoulder pain is likely being contributed by the cervical region. Her radiating pain is also likely due to her cervical spine. The pts functional limitations include carrying, lifting, reaching, and ADLs. The pt will benefit from skilled physical therapy to decrease pain and increase function.     OBJECTIVE IMPAIRMENTS: decreased ROM, decreased strength, hypomobility, impaired UE functional use, improper body mechanics, postural dysfunction, obesity, and pain.   ACTIVITY LIMITATIONS: carrying, lifting, sleeping, bed mobility, bathing, toileting, dressing, reach over head, and hygiene/grooming  PARTICIPATION LIMITATIONS: shopping, community activity, and yard work  PERSONAL FACTORS: Time since onset of injury/illness/exacerbation and 3+ comorbidities: Vitamin D  deficiency, migraines, anxiety, depression are  also affecting patient's functional outcome.   REHAB POTENTIAL: Fair due to above listed factors  CLINICAL DECISION MAKING: Evolving/moderate complexity  EVALUATION COMPLEXITY: Moderate  GOALS: Goals reviewed with patient? Yes  SHORT TERM GOALS: Target date: 11/25/2024  Pt will be compliant and independent with HEP to assist with symptom management/recovery at home.  Baseline: [id number] Goal status: INITIAL  2.  Pt will report no symptoms below her elbow for at least 1 week.  Baseline: symptoms below elbow  Goal status: INITIAL  3.  Pt will demonstrate at least 120 degrees AROM flexion and ABD. Baseline:  Active/P ROM Right eval  Shoulder flexion 100* PROM 105*  Shoulder extension   Shoulder abduction 90  Shoulder adduction   Shoulder internal rotation   Shoulder external rotation 40* PROM 80*   Goal status: INITIAL   LONG TERM GOALS: Target date: 12/23/2024  Pt will demonstrate at least 160 degrees AROM flexion and ABD. Baseline:  Active/P ROM Right eval  Shoulder flexion 100* PROM 105*  Shoulder extension   Shoulder abduction 90  Shoulder adduction   Shoulder internal rotation   Shoulder external rotation 40*  PROM 80*   Goal status: INITIAL  2.  Pt will report pain no greater than 4/10 for at least 1 week. Baseline: 8/10  Goal status: INITIAL  3.  Pt will demonstrate 4/5 strength throughout the R shoulder.  Baseline: see objective Goal status: INITIAL  4.  Pt will be comfortable with her final HEP in order to continue any symptom management at home and to avoid regression.   Baseline:  [id number] Goal status: INITIAL   PLAN: PT FREQUENCY: 1-2x/week  PT DURATION: 8 weeks  PLANNED INTERVENTIONS: 97110-Therapeutic exercises, 97530- Therapeutic activity, [id number]- Neuromuscular re-education, 97535- Self Care, [id number]- Manual therapy, 97012- Traction (mechanical), Patient/Family education, Taping, Joint mobilization, and Joint manipulation  PLAN FOR NEXT  SESSION: cervical stability, cervical and shoulder manual, shoulder ROM, shoulder stability. HEP review. Response to mechanical traction.    Audianna Landgren PT, DPT, LAT, ATC  11/10/2024  10:19 AM     "

## 2024-11-15 ENCOUNTER — Ambulatory Visit: Admitting: Physical Therapy

## 2024-11-15 ENCOUNTER — Encounter: Payer: Self-pay | Admitting: Physical Therapy

## 2024-11-15 DIAGNOSIS — G8929 Other chronic pain: Secondary | ICD-10-CM

## 2024-11-15 DIAGNOSIS — M542 Cervicalgia: Secondary | ICD-10-CM

## 2024-11-15 DIAGNOSIS — M5412 Radiculopathy, cervical region: Secondary | ICD-10-CM

## 2024-11-15 NOTE — Therapy (Signed)
 " OUTPATIENT PHYSICAL THERAPY TREATMENT   Patient Name: Kendra Rodriguez MRN: [id number] DOB:07/14/1966, 59 y.o., female Today's Date: 11/15/2024  END OF SESSION:  PT End of Session - 11/15/24 0934     Visit Number 3    Number of Visits 16    Date for Recertification  12/23/24    Authorization Type [employer] MCD    PT Start Time 0924    PT Stop Time 1018    PT Time Calculation (min) 54 min    Behavior During Therapy WFL for tasks assessed/performed            Past Medical History:  Diagnosis Date   Acute sinusitis    Allergy    Anxiety    Depression    NO MEDICATIONS - DOING OK NOW   Dizziness    Ear ache    RIGHT --HX OF MULTIPLE EAR INFECTIONS AS A CHILD    GERD (gastroesophageal reflux disease)    Headache(784.0)    MIGRAINES   Leg pain, left    with swelling   Lumbar stenosis    PAIN BACK AND LEFT LEG AND NUMBNESS LEG AND FOOT   Peripheral vascular disease    HX OF TREATMENT FOR VARICOSE VEINS RIGHT LEG   Thyroid  disease    TOLD BLOOD LEVELS SLIGHTLY ABNORMAL - BUT NOT REQUIRED MEDICATION   Past Surgical History:  Procedure Laterality Date   BACK SURGERY     bladder lift     AT SAME TIME OF HYSTERECTOMY   BRAVO PH STUDY N/A 07/26/2021   Procedure: BRAVO PH STUDY;  Surgeon: Rollin Dover, MD;  Location: [facility];  Service: Endoscopy;  Laterality: N/A;   CHOLECYSTECTOMY     ESOPHAGOGASTRODUODENOSCOPY (EGD) WITH PROPOFOL  N/A 07/26/2021   Procedure: ESOPHAGOGASTRODUODENOSCOPY (EGD) WITH PROPOFOL ;  Surgeon: Rollin Dover, MD;  Location: [facility];  Service: Endoscopy;  Laterality: N/A;   LUMBAR LAMINECTOMY/DECOMPRESSION MICRODISCECTOMY Left 10/24/2014   Procedure: COMPLETE LUMBAR DECOMPRESSION L4-L5 CENTRAL AND MICRODISCECTOMY L4-L5 LEFT;  Surgeon: Tanda DELENA Heading, MD;  Location: [facility];  Service: Orthopedics;  Laterality: Left;   POLYPECTOMY  07/26/2021   Procedure: POLYPECTOMY;  Surgeon: Rollin Dover, MD;  Location: [facility];  Service:  Endoscopy;;   VAGINAL HYSTERECTOMY     Patient Active Problem List   Diagnosis Date Noted   Sinus headache 07/26/2024   Bacterial sinusitis 05/16/2024   Sinus congestion 05/16/2024   Raynaud's syndrome with gangrene (HCC) 09/15/2023   Chronic pain of left knee 07/30/2023   Chronic pelvic pain in female 03/09/2023   Chronic bilateral low back pain without sciatica 03/09/2023   Primary osteoarthritis of left knee 09/08/2022   Vitamin D  deficiency 09/08/2022   Hypothyroidism 11/29/2021   Body mass index (BMI) of 35.0-35.9 in adult 06/04/2020   History of migraine headaches 12/06/2019   Dyslipidemia 06/30/2019   GERD with esophagitis 06/30/2019   Chronic vertigo 04/13/2018   Prediabetes 10/17/2015   Spinal stenosis, lumbar region, with neurogenic claudication 10/24/2014    PCP: Purcell Emil Schanz, MD  REFERRING PROVIDER: Persons, Ronal Dragon, [place]  REFERRING DIAG: [phone] (ICD-10-CM) - Chronic right shoulder pain  THERAPY DIAG:  Chronic right shoulder pain  Neck pain  Radiculopathy, cervical region  Rationale for Evaluation and Treatment: Rehabilitation  ONSET DATE: 1 month ago  SUBJECTIVE:  SUBJECTIVE STATEMENT: 11/15/2024 after the last session the traction made my neck sore but it felt better down my R shoulder and arm. Today I am only having 5/10 pain, still the elbow is worst.   Eval: Interpreter present Pt reports her shoulder pain started 1 month ago without trauma. She notes this pain goes into her elbow and hand with occasional R hand numbness. She had a CSI on 12/04 which has not changed her symptoms. After further questioning, the pt has chronic neck pain (R>L) that has worsened since the R shoulder pain.   Hand dominance: Right  PERTINENT HISTORY: Vitamin D  deficiency,  migraines, anxiety, depression  PAIN:  Are you having pain? Yes: NPRS scale: 5/10 Pain location: R shoulder and neck Pain description: ache Aggravating factors: movement Relieving factors: heat, arthritis cream  PRECAUTIONS: None  RED FLAGS: None   WEIGHT BEARING RESTRICTIONS: No  FALLS:  Has patient fallen in last 6 months? No  LIVING ENVIRONMENT: Lives with: lives with their family Lives in: House/apartment Stairs: did not ask Has following equipment at home: None  OCCUPATION: Did not ask  PLOF: Independent  PATIENT GOALS: less pain  NEXT MD VISIT: 11/08/2024  OBJECTIVE:  Note: Objective measures were completed at Evaluation unless otherwise noted.  DIAGNOSTIC FINDINGS:  Xray C-spine: No acute changes.  Slight straightening of the normal lordotic curve.   Some degenerative changes with slight narrowing at C6-C7  Xray R shoulder: No acute osseous changes.  She does have some early degenerative changes of the [facility] joint.  Glenohumeral joint is fairly well-preserved PATIENT SURVEYS :  Quick Dash:  QUICK DASH  Please rate your ability do the following activities in the last week by selecting the number below the appropriate response.   Activities Rating  Open a tight or new jar.  2 = Mild difficulty  Do heavy household chores (e.g., wash walls, floors). 4 = Severe difficulty  Carry a shopping bag or briefcase 2 = Mild difficulty  Wash your back. 3 = Moderate difficulty  Use a knife to cut food. 2 = Mild difficulty  Recreational activities in which you take some force or impact through your arm, shoulder or hand (e.g., golf, hammering, tennis, etc.). 2 = Mild difficulty  During the past week, to what extent has your arm, shoulder or hand problem interfered with your normal social activities with family, friends, neighbors or groups?  5 = Extremely  During the past week, were you limited in your work or other regular daily activities as a result of your arm,  shoulder or hand problem? 4 = Very limited  Rate the severity of the following symptoms in the last week: Arm, Shoulder, or hand pain. 4 = Severe  Rate the severity of the following symptoms in the last week: Tingling (pins and needles) in your arm, shoulder or hand. 3 = Moderate  During the past week, how much difficulty have you had sleeping because of the pain in your arm, shoulder or hand?  4 = Severe difficulty   (A QuickDASH score may not be calculated if there is greater than 1 missing item.)  Quick Dash Disability/Symptom Score: [(sum of 35 (n) responses/11 (n)] x 25 = 79  Minimally Clinically Important Difference (MCID): 15-20 points  (Franchignoni, F. et al. (2013). Minimally clinically important difference of the disabilities of the arm, shoulder, and hand outcome measures (DASH) and its shortened version (Quick DASH). Journal of Orthopaedic & Sports Physical Therapy, 44(1), 30-39)   COGNITION: Overall cognitive  status: Within functional limits for tasks assessed     SENSATION: Not tested  POSTURE: Protracted scapulas, forward head, rounded shoulders  CERVICAL ROM:   Active ROM A/PROM (deg) eval  Flexion WNL*  Extension WNL*  Right lateral flexion WNL*  Left lateral flexion WNL*  Right rotation WNL  Left rotation WNL*   (Blank rows = not tested)(* = pain)    UPPER EXTREMITY ROM:   Active/P ROM Right eval Left eval  Shoulder flexion 100* PROM 105*   Shoulder extension    Shoulder abduction 90   Shoulder adduction    Shoulder internal rotation    Shoulder external rotation 40* PROM 80*   Elbow flexion    Elbow extension    Wrist flexion    Wrist extension    Wrist ulnar deviation    Wrist radial deviation    Wrist pronation    Wrist supination    (Blank rows = not tested)(* = pain)   UPPER EXTREMITY MMT: deferred due to limited ROM and pain  MMT Right eval Left eval  Shoulder flexion    Shoulder extension    Shoulder abduction    Shoulder  adduction    Shoulder internal rotation    Shoulder external rotation    Middle trapezius    Lower trapezius    Elbow flexion    Elbow extension    Wrist flexion    Wrist extension    Wrist ulnar deviation    Wrist radial deviation    Wrist pronation    Wrist supination    Grip strength (lbs)    (Blank rows = not tested)  JOINT MOBILITY TESTING:  Pain with inferior glenohumeral jt mobs; no pain with posterior  Pain with inferior and posterior AC jt mobs  PALPATION:  Tenderness over R anterior shoulder, AC joint, and distal clavicle    CERVICAL SPECIAL TESTS:  Spurling's test: Positive and Distraction test: Positive                                                                                                                             TREATMENT :  [facility] Adult PT Treatment:                                                DATE: 11/15/2024 C3-C7 R lateral gapping mobs grade 3 MTPR along the R upper trap, levator and cervical paraspinalss MTPR along the R elbow common extensor  Median nerve flossing in sitting Foam rolled tendon relief strap for elbow/ common extensors Wrist extensor stretch 2 x 30 sec Mechanical Traction  x 10 min , Mx pull 16, min pull 11, x 10 min with hold time of 60 sec x  [facility] Adult PT Treatment:  DATE: 11/10/2024 C3-C7 R lateral gapping mobs grade 3 Manual traction pt noted relief of sx in RUE MTPR along the R upper trap, levator and cervical paraspinas Supine chin tuck head lift 1 x 10 holding 5 sec Upper cervical flexion with rotation Mechanical Traction  x 10 min , Mx pull 16, min pull 11, x 10 min with hold time of 60 sec x  [facility] Adult PT Treatment:                                                DATE: 10/28/2024   Initial evaluation: see patient education and home exercise program as noted below     PATIENT EDUCATION: Education details: POC, HEP, diagnosis, prognosis.  Person educated:  Patient Education method: Explanation, Demonstration, Tactile cues, Verbal cues, and Handouts Education comprehension: verbalized understanding, returned demonstration, verbal cues required, tactile cues required, and needs further education  HOME EXERCISE PROGRAM: Access Code: [email or web address] URL: [email or web address] Date: 10/28/2024 Prepared by: Marijo Berber  Exercises - Seated Cervical Retraction  - 1-2 x daily - 7 x weekly - 3 sets - 10 reps - 5sec hold - Seated Scapular Retraction  - 1-2 x daily - 7 x weekly - 3 sets - 10 reps - 5sec hold - Seated Assisted Cervical Rotation with Towel  - 1 x daily - 7 x weekly - 1 sets - 10 reps - 5sec hold - Standing Isometric Shoulder Abduction with Doorway - Arm Bent  - 1 x daily - 7 x weekly - 2 sets - 10 reps - 5sec hold  ASSESSMENT:  CLINICAL IMPRESSION: 11/15/2024 Mrs Kroeker arrives to PT noting pain in the shoulder and elbow have reduced to 5/10 following prior session. Upon futher assessment patients elbow pain appears to be located predominately over the lateral epicondyle and demonstrated positive special test findings for both cozens and Maudsleys test suggesting high liklihood of lateral epicondalgia, in combination with a referral of sx from the cervical spine. End of session she noted reduction in pain to a 4/10.    EVAL: Patient is a 59 y.o. F who was seen today for physical therapy evaluation and treatment for R shoulder pain. The pt demonstrates limited and painful R shoulder ROM, weakness, painful cervical ROM, and postural dysfunction. With screening of the cervical spine, the pts main complaint of shoulder pain is likely being contributed by the cervical region. Her radiating pain is also likely due to her cervical spine. The pts functional limitations include carrying, lifting, reaching, and ADLs. The pt will benefit from skilled physical therapy to decrease pain and increase function.     OBJECTIVE IMPAIRMENTS:  decreased ROM, decreased strength, hypomobility, impaired UE functional use, improper body mechanics, postural dysfunction, obesity, and pain.   ACTIVITY LIMITATIONS: carrying, lifting, sleeping, bed mobility, bathing, toileting, dressing, reach over head, and hygiene/grooming  PARTICIPATION LIMITATIONS: shopping, community activity, and yard work  PERSONAL FACTORS: Time since onset of injury/illness/exacerbation and 3+ comorbidities: Vitamin D  deficiency, migraines, anxiety, depression are also affecting patient's functional outcome.   REHAB POTENTIAL: Fair due to above listed factors  CLINICAL DECISION MAKING: Evolving/moderate complexity  EVALUATION COMPLEXITY: Moderate  GOALS: Goals reviewed with patient? Yes  SHORT TERM GOALS: Target date: 11/25/2024  Pt will be compliant and independent with HEP to assist with symptom management/recovery at home.  Baseline: [email or web address] Goal status: INITIAL  2.  Pt will report no symptoms below her elbow for at least 1 week.  Baseline: symptoms below elbow  Goal status: INITIAL  3.  Pt will demonstrate at least 120 degrees AROM flexion and ABD. Baseline:  Active/P ROM Right eval  Shoulder flexion 100* PROM 105*  Shoulder extension   Shoulder abduction 90  Shoulder adduction   Shoulder internal rotation   Shoulder external rotation 40* PROM 80*   Goal status: INITIAL   LONG TERM GOALS: Target date: 12/23/2024  Pt will demonstrate at least 160 degrees AROM flexion and ABD. Baseline:  Active/P ROM Right eval  Shoulder flexion 100* PROM 105*  Shoulder extension   Shoulder abduction 90  Shoulder adduction   Shoulder internal rotation   Shoulder external rotation 40* PROM 80*   Goal status: INITIAL  2.  Pt will report pain no greater than 4/10 for at least 1 week. Baseline: 8/10  Goal status: INITIAL  3.  Pt will demonstrate 4/5 strength throughout the R shoulder.  Baseline: see objective Goal status: INITIAL  4.  Pt  will be comfortable with her final HEP in order to continue any symptom management at home and to avoid regression.   Baseline:  [email or web address] Goal status: INITIAL   PLAN: PT FREQUENCY: 1-2x/week  PT DURATION: 8 weeks  PLANNED INTERVENTIONS: 97110-Therapeutic exercises, 97530- Therapeutic activity, [id number]- Neuromuscular re-education, 97535- Self Care, [id number]- Manual therapy, 97012- Traction (mechanical), Patient/Family education, Taping, Joint mobilization, and Joint manipulation  PLAN FOR NEXT SESSION: cervical stability, cervical and shoulder manual, shoulder ROM, shoulder stability. HEP review. Elbow and shoulder strengthening.    Danyon Mcginness PT, DPT, LAT, ATC  11/15/2024  12:53 PM     "

## 2024-11-17 ENCOUNTER — Other Ambulatory Visit

## 2024-11-17 ENCOUNTER — Ambulatory Visit (INDEPENDENT_AMBULATORY_CARE_PROVIDER_SITE_OTHER): Admitting: Endocrinology

## 2024-11-17 ENCOUNTER — Encounter: Payer: Self-pay | Admitting: Endocrinology

## 2024-11-17 VITALS — BP 108/60 | HR 76 | Resp 16 | Ht 61.0 in | Wt 194.6 lb

## 2024-11-17 DIAGNOSIS — E039 Hypothyroidism, unspecified: Secondary | ICD-10-CM | POA: Diagnosis not present

## 2024-11-17 DIAGNOSIS — E559 Vitamin D deficiency, unspecified: Secondary | ICD-10-CM | POA: Diagnosis not present

## 2024-11-17 NOTE — Progress Notes (Signed)
 "  Outpatient Endocrinology Note Irvin Lizama, MD  11/17/24  Patient's Name: Kendra Rodriguez    DOB: 18-Jun-1966    MRN: [id number]  REASON OF VISIT: Follow-up for hypothyroidism  PCP: Purcell Emil Schanz, MD  HISTORY OF PRESENT ILLNESS:   Kendra Rodriguez is a 59 y.o. old female with past medical history as listed below is presented for a follow up of hypothyroidism and vitamin D  deficiency.    Pertinent Thyroid  History: Patient was diagnosed with hypothyroidism in May 2022, she was initially presented with subacute thyroiditis with transient hyperthyroidism, develop hypothyroidism spontaneously and Synthroid /levothyroxine  was started.  Patient has prediabetes , used to be on metformin  since 2019, reports diarrhea and currently has not been taking.  Managed by primary care provider.   Interval history  Patient presented for follow-up.  Accompanied by daughter.  She has been taking levothyroxine  50 mcg daily, reports compliance and she had taken until yesterday and ran out.  She has complaints of fatigue.  Denies palpitation or heat intolerance.  Denies cold intolerance.  Patient also has complaints of multiple joint pain recently received multiple steroid injection into the shoulder and lower back.  She has history of vitamin D  deficiency, has not been taking vitamin D  supplement lately.  Patient has prediabetes.  No recent hemoglobin A1c available to review, used to be managed by primary care provider.  Currently not taking metformin  due to diarrhea.  Patient reports she is going to have follow-up with new primary care provider in February.  She will follow-up with primary care provider for prediabetes management.   REVIEW OF SYSTEMS:  As per history of present illness.   PAST MEDICAL HISTORY: Past Medical History:  Diagnosis Date   Acute sinusitis    Allergy    Anxiety    Depression    NO MEDICATIONS - DOING OK NOW   Dizziness    Ear ache    RIGHT --HX OF MULTIPLE EAR INFECTIONS  AS A CHILD    GERD (gastroesophageal reflux disease)    Headache(784.0)    MIGRAINES   Leg pain, left    with swelling   Lumbar stenosis    PAIN BACK AND LEFT LEG AND NUMBNESS LEG AND FOOT   Peripheral vascular disease    HX OF TREATMENT FOR VARICOSE VEINS RIGHT LEG   Thyroid  disease    TOLD BLOOD LEVELS SLIGHTLY ABNORMAL - BUT NOT REQUIRED MEDICATION    PAST SURGICAL HISTORY: Past Surgical History:  Procedure Laterality Date   BACK SURGERY     bladder lift     AT SAME TIME OF HYSTERECTOMY   BRAVO PH STUDY N/A 07/26/2021   Procedure: BRAVO PH STUDY;  Surgeon: Rollin Dover, MD;  Location: [facility];  Service: Endoscopy;  Laterality: N/A;   CHOLECYSTECTOMY     ESOPHAGOGASTRODUODENOSCOPY (EGD) WITH PROPOFOL  N/A 07/26/2021   Procedure: ESOPHAGOGASTRODUODENOSCOPY (EGD) WITH PROPOFOL ;  Surgeon: Rollin Dover, MD;  Location: [facility];  Service: Endoscopy;  Laterality: N/A;   LUMBAR LAMINECTOMY/DECOMPRESSION MICRODISCECTOMY Left 10/24/2014   Procedure: COMPLETE LUMBAR DECOMPRESSION L4-L5 CENTRAL AND MICRODISCECTOMY L4-L5 LEFT;  Surgeon: Tanda DELENA Heading, MD;  Location: [facility];  Service: Orthopedics;  Laterality: Left;   POLYPECTOMY  07/26/2021   Procedure: POLYPECTOMY;  Surgeon: Rollin Dover, MD;  Location: [facility];  Service: Endoscopy;;   VAGINAL HYSTERECTOMY      ALLERGIES: No Known Allergies  FAMILY HISTORY:  Family History  Problem Relation Age of Onset   Hypertension Mother    Arthritis Mother  Diabetes Mother    Hyperlipidemia Mother    Arthritis Sister    Rheum arthritis Brother    Autoimmune disease Brother    Lung disease Brother    Arthritis Sister    Gout Son    High Cholesterol Daughter    Thyroid  disease Neg Hx     SOCIAL HISTORY: Social History   Socioeconomic History   Marital status: Married    Spouse name: Not on file   Number of children: Not on file   Years of education: Not on file   Highest education level: 6th grade   Occupational History   Not on file  Tobacco Use   Smoking status: Never   Smokeless tobacco: Never  Vaping Use   Vaping status: Never Used  Substance and Sexual Activity   Alcohol use: No   Drug use: Never   Sexual activity: Yes  Other Topics Concern   Not on file  Social History Narrative   ** Merged History Encounter **       Social Drivers of Health   Tobacco Use: Low Risk (11/17/2024)   Patient History    Smoking Tobacco Use: Never    Smokeless Tobacco Use: Never    Passive Exposure: Not on file  Financial Resource Strain: Low Risk (09/18/2023)   Overall Financial Resource Strain (CARDIA)    Difficulty of Paying Living Expenses: Not very hard  Food Insecurity: No Food Insecurity (09/18/2023)   Hunger Vital Sign    Worried About Running Out of Food in the Last Year: Never true    Ran Out of Food in the Last Year: Never true  Transportation Needs: No Transportation Needs (09/18/2023)   PRAPARE - Administrator, Civil Service (Medical): No    Lack of Transportation (Non-Medical): No  Physical Activity: Insufficiently Active (09/18/2023)   Exercise Vital Sign    Days of Exercise per Week: 3 days    Minutes of Exercise per Session: 20 min  Stress: No Stress Concern Present (09/18/2023)   [employer] of Occupational Health - Occupational Stress Questionnaire    Feeling of Stress : Only a little  Social Connections: Unknown (09/18/2023)   Social Connection and Isolation Panel    Frequency of Communication with Friends and Family: More than three times a week    Frequency of Social Gatherings with Friends and Family: More than three times a week    Attends Religious Services: More than 4 times per year    Active Member of [employer] or Organizations: No    Attends Banker Meetings: Not on file    Marital Status: Not on file  Depression (PHQ2-9): Low Risk (05/10/2024)   Depression (PHQ2-9)    PHQ-2 Score: 0  Alcohol Screen: Not on file   Housing: Low Risk (09/18/2023)   Housing    Last Housing Risk Score: 0  Utilities: Not on file  Health Literacy: Not on file    MEDICATIONS:  Current Outpatient Medications  Medication Sig Dispense Refill   ALPRAZolam  (XANAX ) 0.5 MG tablet Take 1 tablet (0.5 mg total) by mouth daily as needed for anxiety. 20 tablet 1   amoxicillin -clavulanate (AUGMENTIN ) 875-125 MG tablet Take 1 tablet by mouth every 12 (twelve) hours. 14 tablet 0   atorvastatin  (LIPITOR) 40 MG tablet TAKE 1 TABLET(40 MG) BY MOUTH DAILY 90 tablet 3   cetirizine  (ZYRTEC  ALLERGY) 10 MG tablet Take 1 tablet (10 mg total) by mouth daily. 30 tablet 0   cyclobenzaprine  (FLEXERIL ) 10  MG tablet Take 1 tablet (10 mg total) by mouth 2 (two) times daily as needed for muscle spasms. 20 tablet 0   diazepam  (VALIUM ) 5 MG tablet Take 1 tablet (5 mg total) by mouth once as needed for up to 1 dose (prior to MRI for anxiety). 1 tablet 0   famotidine  (PEPCID ) 20 MG tablet Take 1 tablet (20 mg total) by mouth 2 (two) times daily. 30 tablet 0   ipratropium (ATROVENT ) 0.03 % nasal spray Place 2 sprays into both nostrils every 12 (twelve) hours. 30 mL 0   levothyroxine  (SYNTHROID ) 50 MCG tablet TAKE 1 TABLET(50 MCG) BY MOUTH DAILY 90 tablet 3   metFORMIN  (GLUCOPHAGE -XR) 500 MG 24 hr tablet Take 1 tablet (500 mg total) by mouth 2 (two) times daily with a meal. 120 tablet 3   Olopatadine  HCl 0.2 % SOLN Apply 1 drop to eye daily as needed. 2.5 mL 3   pantoprazole  (PROTONIX ) 40 MG tablet Take 40 mg by mouth daily before breakfast.     pregabalin  (LYRICA ) 75 MG capsule Take 1 capsule (75 mg total) by mouth 2 (two) times daily. 84 capsule 0   trimethoprim -polymyxin b  (POLYTRIM ) ophthalmic solution Place 1 drop into the left eye every 6 (six) hours. 10 mL 0   No current facility-administered medications for this visit.    PHYSICAL EXAM: Vitals:   11/17/24 1115  BP: 108/60  Pulse: 76  Resp: 16  SpO2: 99%  Weight: 194 lb 9.6 oz (88.3 kg)   Height: 5' 1 (1.549 m)    Body mass index is 36.77 kg/m.  Wt Readings from Last 3 Encounters:  11/17/24 194 lb 9.6 oz (88.3 kg)  07/26/24 196 lb (88.9 kg)  05/16/24 196 lb (88.9 kg)    General: Well developed, well nourished female in no apparent distress.  HEENT: AT/[place], no external lesions. Hearing intact to the spoken word Eyes: Conjunctiva clear and no icterus. Neck: Trachea midline, neck supple without appreciable thyromegaly or lymphadenopathy and no palpable thyroid  nodules Lungs: Clear to auscultation, no wheeze. Respirations not labored Heart: S1S2, Regular in rate and rhythm.  Abdomen: Soft, non tender, non distended, no masses, no striae Neurologic: Alert, oriented, normal speech, deep tendon biceps reflexes normal,  no gross focal neurological deficit Extremities: No pedal pitting edema, no tremors of outstretched hands Skin: Warm, color good.  Psychiatric: Does not appear depressed or anxious  PERTINENT HISTORIC LABORATORY AND IMAGING STUDIES:  All pertinent laboratory results were reviewed. Please see HPI also for further details.   TSH  Date Value Ref Range Status  08/12/2024 1.18 0.41 - 5.90 Final  03/14/2024 1.04 0.40 - 4.50 mIU/L Final  09/18/2023 1.24 0.35 - 5.50 uIU/mL Final     ASSESSMENT / PLAN  1. Primary hypothyroidism   2. Vitamin D  deficiency    -Patient was diagnosed with hypothyroidism in 2022.  Currently taking levothyroxine  50 mcg daily.  Did not from yesterday. -She has history of vitamin D  deficiency.  Currently not taking vitamin D  supplement.   Plan: -Check thyroid  function test today and adjust the dose of levothyroxine  as needed.  She needs refill. - Check vitamin D  level today.  Will advise vitamin D  supplement based on vitamin D  level today. -Labs prior to follow-up visit in 6 months.   Diagnoses and all orders for this visit:  Primary hypothyroidism -     T4, free -     TSH  Vitamin D  deficiency -     VITAMIN D  25  Hydroxy  (Vit-D Deficiency, Fractures)    DISPOSITION Follow up in clinic in 6 months suggested.  Labs today as ordered.  All questions answered and patient verbalized understanding of the plan.  Richie Bonanno, MD [facility] Endocrinology [facility] [address] [place], [address] [place], [place] [address] Phone # [phone]  At least part of this note was generated using voice recognition software. Inadvertent word errors may have occurred, which were not recognized during the proofreading process.    "

## 2024-11-18 ENCOUNTER — Ambulatory Visit: Payer: Self-pay | Admitting: Endocrinology

## 2024-11-18 ENCOUNTER — Ambulatory Visit

## 2024-11-18 DIAGNOSIS — E039 Hypothyroidism, unspecified: Secondary | ICD-10-CM

## 2024-11-18 LAB — VITAMIN D 25 HYDROXY (VIT D DEFICIENCY, FRACTURES): Vit D, 25-Hydroxy: 39 ng/mL (ref 30–100)

## 2024-11-18 LAB — TSH: TSH: 0.8 m[IU]/L (ref 0.40–4.50)

## 2024-11-18 LAB — T4, FREE: Free T4: 1.4 ng/dL (ref 0.8–1.8)

## 2024-11-18 MED ORDER — LEVOTHYROXINE SODIUM 50 MCG PO TABS: ORAL_TABLET | ORAL | 3 refills | Status: AC

## 2024-11-22 ENCOUNTER — Ambulatory Visit: Admitting: Physical Therapy

## 2024-11-22 DIAGNOSIS — M5416 Radiculopathy, lumbar region: Secondary | ICD-10-CM

## 2024-11-22 DIAGNOSIS — G8929 Other chronic pain: Secondary | ICD-10-CM

## 2024-11-22 DIAGNOSIS — M5412 Radiculopathy, cervical region: Secondary | ICD-10-CM

## 2024-11-22 DIAGNOSIS — M542 Cervicalgia: Secondary | ICD-10-CM

## 2024-11-22 NOTE — Therapy (Signed)
 " OUTPATIENT PHYSICAL THERAPY TREATMENT   Patient Name: Kendra Rodriguez MRN: [id number] DOB:[birth year], 59 y.o., female Today's Date: 11/22/2024  END OF SESSION:  PT End of Session - 11/22/24 0937     Visit Number 4    Number of Visits 16    Date for Recertification  12/23/24    Authorization Type [employer] MCD    PT Start Time 0932    PT Stop Time 1016    PT Time Calculation (min) 44 min    Activity Tolerance Patient tolerated treatment well    Behavior During Therapy WFL for tasks assessed/performed             Past Medical History:  Diagnosis Date   Acute sinusitis    Allergy    Anxiety    Depression    NO MEDICATIONS - DOING OK NOW   Dizziness    Ear ache    RIGHT --HX OF MULTIPLE EAR INFECTIONS AS A CHILD    GERD (gastroesophageal reflux disease)    Headache(784.0)    MIGRAINES   Leg pain, left    with swelling   Lumbar stenosis    PAIN BACK AND LEFT LEG AND NUMBNESS LEG AND FOOT   Peripheral vascular disease    HX OF TREATMENT FOR VARICOSE VEINS RIGHT LEG   Thyroid  disease    TOLD BLOOD LEVELS SLIGHTLY ABNORMAL - BUT NOT REQUIRED MEDICATION   Past Surgical History:  Procedure Laterality Date   BACK SURGERY     bladder lift     AT SAME TIME OF HYSTERECTOMY   BRAVO PH STUDY N/A 07/26/2021   Procedure: BRAVO PH STUDY;  Surgeon: Rollin Dover, MD;  Location: [facility];  Service: Endoscopy;  Laterality: N/A;   CHOLECYSTECTOMY     ESOPHAGOGASTRODUODENOSCOPY (EGD) WITH PROPOFOL  N/A 07/26/2021   Procedure: ESOPHAGOGASTRODUODENOSCOPY (EGD) WITH PROPOFOL ;  Surgeon: Rollin Dover, MD;  Location: [facility];  Service: Endoscopy;  Laterality: N/A;   LUMBAR LAMINECTOMY/DECOMPRESSION MICRODISCECTOMY Left 10/24/2014   Procedure: COMPLETE LUMBAR DECOMPRESSION L4-L5 CENTRAL AND MICRODISCECTOMY L4-L5 LEFT;  Surgeon: Tanda DELENA Heading, MD;  Location: [facility];  Service: Orthopedics;  Laterality: Left;   POLYPECTOMY  07/26/2021   Procedure: POLYPECTOMY;  Surgeon:  Rollin Dover, MD;  Location: [facility];  Service: Endoscopy;;   VAGINAL HYSTERECTOMY     Patient Active Problem List   Diagnosis Date Noted   Sinus headache 07/26/2024   Bacterial sinusitis 05/16/2024   Sinus congestion 05/16/2024   Raynaud's syndrome with gangrene (HCC) 09/15/2023   Chronic pain of left knee 07/30/2023   Chronic pelvic pain in female 03/09/2023   Chronic bilateral low back pain without sciatica 03/09/2023   Primary osteoarthritis of left knee 09/08/2022   Vitamin D  deficiency 09/08/2022   Hypothyroidism 11/29/2021   Body mass index (BMI) of 35.0-35.9 in adult 06/04/2020   History of migraine headaches 12/06/2019   Dyslipidemia 06/30/2019   GERD with esophagitis 06/30/2019   Chronic vertigo 04/13/2018   Prediabetes 10/17/2015   Spinal stenosis, lumbar region, with neurogenic claudication 10/24/2014    PCP: Purcell Emil Schanz, MD  REFERRING PROVIDER: Persons, Ronal Dragon, [place]  REFERRING DIAG: [phone] (ICD-10-CM) - Chronic right shoulder pain  THERAPY DIAG:  Chronic right shoulder pain  Neck pain  Radiculopathy, cervical region  Radiculopathy, lumbar region  Rationale for Evaluation and Treatment: Rehabilitation  ONSET DATE: 1 month ago  SUBJECTIVE:  SUBJECTIVE STATEMENT: 11/22/2024  I am having the worst soreness in my elbow at 7/10 and the shoulder and neck are about 5/10.  Eval: Interpreter present Pt reports her shoulder pain started 1 month ago without trauma. She notes this pain goes into her elbow and hand with occasional R hand numbness. She had a CSI on 12/04 which has not changed her symptoms. After further questioning, the pt has chronic neck pain (R>L) that has worsened since the R shoulder pain.   Hand dominance: Right  PERTINENT HISTORY: Vitamin  D deficiency, migraines, anxiety, depression  PAIN:  Are you having pain? Yes: NPRS scale: 5/10 in shoulder/ neck, and 7/10 in the elbow Pain location: R shoulder and neck Pain description: ache Aggravating factors: movement Relieving factors: heat, arthritis cream  PRECAUTIONS: None  RED FLAGS: None   WEIGHT BEARING RESTRICTIONS: No  FALLS:  Has patient fallen in last 6 months? No  LIVING ENVIRONMENT: Lives with: lives with their family Lives in: House/apartment Stairs: did not ask Has following equipment at home: None  OCCUPATION: Did not ask  PLOF: Independent  PATIENT GOALS: less pain  NEXT MD VISIT: 11/08/2024  OBJECTIVE:  Note: Objective measures were completed at Evaluation unless otherwise noted.  DIAGNOSTIC FINDINGS:  Xray C-spine: No acute changes.  Slight straightening of the normal lordotic curve.   Some degenerative changes with slight narrowing at C6-C7  Xray R shoulder: No acute osseous changes.  She does have some early degenerative changes of the [facility] joint.  Glenohumeral joint is fairly well-preserved PATIENT SURVEYS :  Quick Dash:  QUICK DASH  Please rate your ability do the following activities in the last week by selecting the number below the appropriate response.   Activities Rating  Open a tight or new jar.  2 = Mild difficulty  Do heavy household chores (e.g., wash walls, floors). 4 = Severe difficulty  Carry a shopping bag or briefcase 2 = Mild difficulty  Wash your back. 3 = Moderate difficulty  Use a knife to cut food. 2 = Mild difficulty  Recreational activities in which you take some force or impact through your arm, shoulder or hand (e.g., golf, hammering, tennis, etc.). 2 = Mild difficulty  During the past week, to what extent has your arm, shoulder or hand problem interfered with your normal social activities with family, friends, neighbors or groups?  5 = Extremely  During the past week, were you limited in your work or other  regular daily activities as a result of your arm, shoulder or hand problem? 4 = Very limited  Rate the severity of the following symptoms in the last week: Arm, Shoulder, or hand pain. 4 = Severe  Rate the severity of the following symptoms in the last week: Tingling (pins and needles) in your arm, shoulder or hand. 3 = Moderate  During the past week, how much difficulty have you had sleeping because of the pain in your arm, shoulder or hand?  4 = Severe difficulty   (A QuickDASH score may not be calculated if there is greater than 1 missing item.)  Quick Dash Disability/Symptom Score: [(sum of 35 (n) responses/11 (n)] x 25 = 79  Minimally Clinically Important Difference (MCID): 15-20 points  (Franchignoni, F. et al. (2013). Minimally clinically important difference of the disabilities of the arm, shoulder, and hand outcome measures (DASH) and its shortened version (Quick DASH). Journal of Orthopaedic & Sports Physical Therapy, 44(1), 30-39)   COGNITION: Overall cognitive status: Within functional limits for  tasks assessed     SENSATION: Not tested  POSTURE: Protracted scapulas, forward head, rounded shoulders  CERVICAL ROM:   Active ROM A/PROM (deg) eval  Flexion WNL*  Extension WNL*  Right lateral flexion WNL*  Left lateral flexion WNL*  Right rotation WNL  Left rotation WNL*   (Blank rows = not tested)(* = pain)    UPPER EXTREMITY ROM:   Active/P ROM Right eval Left eval  Shoulder flexion 100* PROM 105*   Shoulder extension    Shoulder abduction 90   Shoulder adduction    Shoulder internal rotation    Shoulder external rotation 40* PROM 80*   Elbow flexion    Elbow extension    Wrist flexion    Wrist extension    Wrist ulnar deviation    Wrist radial deviation    Wrist pronation    Wrist supination    (Blank rows = not tested)(* = pain)   UPPER EXTREMITY MMT: deferred due to limited ROM and pain  MMT Right eval Left eval  Shoulder flexion    Shoulder  extension    Shoulder abduction    Shoulder adduction    Shoulder internal rotation    Shoulder external rotation    Middle trapezius    Lower trapezius    Elbow flexion    Elbow extension    Wrist flexion    Wrist extension    Wrist ulnar deviation    Wrist radial deviation    Wrist pronation    Wrist supination    Grip strength (lbs)    (Blank rows = not tested)  JOINT MOBILITY TESTING:  Pain with inferior glenohumeral jt mobs; no pain with posterior  Pain with inferior and posterior AC jt mobs  PALPATION:  Tenderness over R anterior shoulder, AC joint, and distal clavicle    CERVICAL SPECIAL TESTS:  Spurling's test: Positive and Distraction test: Positive                                                                                                                             TREATMENT :  [facility] Adult PT Treatment:                                                DATE: 11/22/2024 Seated chin tuck 2  x 10 holding 5 seconds MTPR along the common wrist extensors MTPR along the R piriformis Figure four piriformis stretch 2 x 30 sec. Eccentric extensor activation 3 x 10# Wrist extensor stretch 2 x 30 sec Lateral elbow grade IV no cavitation noted Reviewed posture and sleeping positioning.  Reviewed and updated HEP today  [facility] Adult PT Treatment:  DATE: 11/15/2024 C3-C7 R lateral gapping mobs grade 3 MTPR along the R upper trap, levator and cervical paraspinalss MTPR along the R elbow common extensor  Median nerve flossing in sitting Foam rolled tendon relief strap for elbow/ common extensors Wrist extensor stretch 2 x 30 sec Mechanical Traction  x 10 min , Mx pull 16, min pull 11, x 10 min with hold time of 60 sec x  [facility] Adult PT Treatment:                                                DATE: 11/10/2024 C3-C7 R lateral gapping mobs grade 3 Manual traction pt noted relief of sx in RUE MTPR along the R upper trap, levator and  cervical paraspinas Supine chin tuck head lift 1 x 10 holding 5 sec Upper cervical flexion with rotation Mechanical Traction  x 10 min , Mx pull 16, min pull 11, x 10 min with hold time of 60 sec x  [facility] Adult PT Treatment:                                                DATE: 10/28/2024   Initial evaluation: see patient education and home exercise program as noted below     PATIENT EDUCATION: Education details: POC, HEP, diagnosis, prognosis.  Person educated: Patient Education method: Explanation, Demonstration, Tactile cues, Verbal cues, and Handouts Education comprehension: verbalized understanding, returned demonstration, verbal cues required, tactile cues required, and needs further education  HOME EXERCISE PROGRAM: Access Code: [email or web address] URL: [email or web address] Date: 11/22/2024 Prepared by: Joneen Fresh  Exercises - Seated Cervical Retraction  - 1-2 x daily - 7 x weekly - 3 sets - 10 reps - 5sec hold - Seated Scapular Retraction  - 1-2 x daily - 7 x weekly - 3 sets - 10 reps - 5sec hold - Seated Assisted Cervical Rotation with Towel  - 1 x daily - 7 x weekly - 1 sets - 10 reps - 5sec hold - Standing Isometric Shoulder Abduction with Doorway - Arm Bent  - 1 x daily - 7 x weekly - 2 sets - 10 reps - 5sec hold - Median Nerve Flossing - Tray  - 1 x daily - 7 x weekly - 2 sets - 10 reps - Wrist Extensor Stretch  - 2-3 x daily - 7 x weekly - 2 sets - 3 reps - 30-60 sec hold - Supine Figure 4 Piriformis Stretch  - 3 x daily - 7 x weekly - 2 sets - 2 reps - 30-60 segundos hold - Seated Eccentric Wrist Extension  - 1 x daily - 7 x weekly - 3 sets - 10-12 reps  ASSESSMENT:  CLINICAL IMPRESSION: 11/22/2024 Lupe arrives to PT noting continued elevated pain located at the elbow, reduce pain in the shoulder and neck. Focused primarily on the elbow to reduce pain and inflammation to reduce potential compensation which is likely contributing to her shoulder aggravation.  Reviewed stretch of the hip to reduce distance kinetic chain tension and compensatoin which she noted relief of tension. Reviewed and updated HEP today.    EVAL: Patient is a 59 y.o. F who was seen today for physical therapy evaluation and treatment for R shoulder pain.  The pt demonstrates limited and painful R shoulder ROM, weakness, painful cervical ROM, and postural dysfunction. With screening of the cervical spine, the pts main complaint of shoulder pain is likely being contributed by the cervical region. Her radiating pain is also likely due to her cervical spine. The pts functional limitations include carrying, lifting, reaching, and ADLs. The pt will benefit from skilled physical therapy to decrease pain and increase function.     OBJECTIVE IMPAIRMENTS: decreased ROM, decreased strength, hypomobility, impaired UE functional use, improper body mechanics, postural dysfunction, obesity, and pain.   ACTIVITY LIMITATIONS: carrying, lifting, sleeping, bed mobility, bathing, toileting, dressing, reach over head, and hygiene/grooming  PARTICIPATION LIMITATIONS: shopping, community activity, and yard work  PERSONAL FACTORS: Time since onset of injury/illness/exacerbation and 3+ comorbidities: Vitamin D  deficiency, migraines, anxiety, depression are also affecting patient's functional outcome.   REHAB POTENTIAL: Fair due to above listed factors  CLINICAL DECISION MAKING: Evolving/moderate complexity  EVALUATION COMPLEXITY: Moderate  GOALS: Goals reviewed with patient? Yes  SHORT TERM GOALS: Target date: 11/25/2024  Pt will be compliant and independent with HEP to assist with symptom management/recovery at home.  Baseline: [email or web address] Goal status: INITIAL  2.  Pt will report no symptoms below her elbow for at least 1 week.  Baseline: symptoms below elbow  Goal status: INITIAL  3.  Pt will demonstrate at least 120 degrees AROM flexion and ABD. Baseline:  Active/P ROM Right eval   Shoulder flexion 100* PROM 105*  Shoulder extension   Shoulder abduction 90  Shoulder adduction   Shoulder internal rotation   Shoulder external rotation 40* PROM 80*   Goal status: INITIAL   LONG TERM GOALS: Target date: 12/23/2024  Pt will demonstrate at least 160 degrees AROM flexion and ABD. Baseline:  Active/P ROM Right eval  Shoulder flexion 100* PROM 105*  Shoulder extension   Shoulder abduction 90  Shoulder adduction   Shoulder internal rotation   Shoulder external rotation 40* PROM 80*   Goal status: INITIAL  2.  Pt will report pain no greater than 4/10 for at least 1 week. Baseline: 8/10  Goal status: INITIAL  3.  Pt will demonstrate 4/5 strength throughout the R shoulder.  Baseline: see objective Goal status: INITIAL  4.  Pt will be comfortable with her final HEP in order to continue any symptom management at home and to avoid regression.   Baseline:  [email or web address] Goal status: INITIAL   PLAN: PT FREQUENCY: 1-2x/week  PT DURATION: 8 weeks  PLANNED INTERVENTIONS: 97110-Therapeutic exercises, 97530- Therapeutic activity, [id number]- Neuromuscular re-education, 97535- Self Care, [id number]- Manual therapy, 97012- Traction (mechanical), Patient/Family education, Taping, Joint mobilization, and Joint manipulation  PLAN FOR NEXT SESSION: cervical stability, cervical and shoulder manual, shoulder ROM, shoulder stability. HEP review. Elbow and shoulder strengthening.    Paysley Poplar PT, DPT, LAT, ATC  11/22/24  11:38 AM     "

## 2024-11-23 ENCOUNTER — Ambulatory Visit: Admitting: Physician Assistant

## 2024-11-24 ENCOUNTER — Ambulatory Visit: Admitting: Physical Therapy

## 2024-11-24 ENCOUNTER — Encounter: Payer: Self-pay | Admitting: Physical Therapy

## 2024-11-24 DIAGNOSIS — G8929 Other chronic pain: Secondary | ICD-10-CM

## 2024-11-24 DIAGNOSIS — M5412 Radiculopathy, cervical region: Secondary | ICD-10-CM

## 2024-11-24 DIAGNOSIS — M542 Cervicalgia: Secondary | ICD-10-CM

## 2024-11-24 NOTE — Therapy (Signed)
 " OUTPATIENT PHYSICAL THERAPY TREATMENT   Patient Name: Kendra Rodriguez MRN: [id number] DOB:09-04-1966, 59 y.o., female Today's Date: 11/24/2024  END OF SESSION:  PT End of Session - 11/24/24 0938     Visit Number 5    Number of Visits 16    Date for Recertification  12/23/24    Authorization Type [employer] MCD    PT Start Time 0932    PT Stop Time 1015    PT Time Calculation (min) 43 min    Activity Tolerance Patient tolerated treatment well    Behavior During Therapy WFL for tasks assessed/performed              Past Medical History:  Diagnosis Date   Acute sinusitis    Allergy    Anxiety    Depression    NO MEDICATIONS - DOING OK NOW   Dizziness    Ear ache    RIGHT --HX OF MULTIPLE EAR INFECTIONS AS A CHILD    GERD (gastroesophageal reflux disease)    Headache(784.0)    MIGRAINES   Leg pain, left    with swelling   Lumbar stenosis    PAIN BACK AND LEFT LEG AND NUMBNESS LEG AND FOOT   Peripheral vascular disease    HX OF TREATMENT FOR VARICOSE VEINS RIGHT LEG   Thyroid  disease    TOLD BLOOD LEVELS SLIGHTLY ABNORMAL - BUT NOT REQUIRED MEDICATION   Past Surgical History:  Procedure Laterality Date   BACK SURGERY     bladder lift     AT SAME TIME OF HYSTERECTOMY   BRAVO PH STUDY N/A 07/26/2021   Procedure: BRAVO PH STUDY;  Surgeon: Rollin Dover, MD;  Location: [facility];  Service: Endoscopy;  Laterality: N/A;   CHOLECYSTECTOMY     ESOPHAGOGASTRODUODENOSCOPY (EGD) WITH PROPOFOL  N/A 07/26/2021   Procedure: ESOPHAGOGASTRODUODENOSCOPY (EGD) WITH PROPOFOL ;  Surgeon: Rollin Dover, MD;  Location: [facility];  Service: Endoscopy;  Laterality: N/A;   LUMBAR LAMINECTOMY/DECOMPRESSION MICRODISCECTOMY Left 10/24/2014   Procedure: COMPLETE LUMBAR DECOMPRESSION L4-L5 CENTRAL AND MICRODISCECTOMY L4-L5 LEFT;  Surgeon: Tanda DELENA Heading, MD;  Location: [facility];  Service: Orthopedics;  Laterality: Left;   POLYPECTOMY  07/26/2021   Procedure: POLYPECTOMY;   Surgeon: Rollin Dover, MD;  Location: [facility];  Service: Endoscopy;;   VAGINAL HYSTERECTOMY     Patient Active Problem List   Diagnosis Date Noted   Sinus headache 07/26/2024   Bacterial sinusitis 05/16/2024   Sinus congestion 05/16/2024   Raynaud's syndrome with gangrene (HCC) 09/15/2023   Chronic pain of left knee 07/30/2023   Chronic pelvic pain in female 03/09/2023   Chronic bilateral low back pain without sciatica 03/09/2023   Primary osteoarthritis of left knee 09/08/2022   Vitamin D  deficiency 09/08/2022   Hypothyroidism 11/29/2021   Body mass index (BMI) of 35.0-35.9 in adult 06/04/2020   History of migraine headaches 12/06/2019   Dyslipidemia 06/30/2019   GERD with esophagitis 06/30/2019   Chronic vertigo 04/13/2018   Prediabetes 10/17/2015   Spinal stenosis, lumbar region, with neurogenic claudication 10/24/2014    PCP: Purcell Emil Schanz, MD  REFERRING PROVIDER: Persons, Ronal Dragon, [place]  REFERRING DIAG: [phone] (ICD-10-CM) - Chronic right shoulder pain  THERAPY DIAG:  Chronic right shoulder pain  Neck pain  Radiculopathy, cervical region  Rationale for Evaluation and Treatment: Rehabilitation  ONSET DATE: 1 month ago  SUBJECTIVE:  SUBJECTIVE STATEMENT: 11/24/2024 the elbow and shoulder are alittle better the exercise for the elbow is helping. I slept with more propping too and it felt better on my neck.  Eval: Interpreter present Pt reports her shoulder pain started 1 month ago without trauma. She notes this pain goes into her elbow and hand with occasional R hand numbness. She had a CSI on 12/04 which has not changed her symptoms. After further questioning, the pt has chronic neck pain (R>L) that has worsened since the R shoulder pain.   Hand dominance:  Right  PERTINENT HISTORY: Vitamin D  deficiency, migraines, anxiety, depression  PAIN:  Are you having pain? Yes: NPRS scale: 5/10 in shoulder/ neck,  Pain location: R shoulder and neck Pain description: ache Aggravating factors: movement Relieving factors: heat, arthritis cream  PRECAUTIONS: None  RED FLAGS: None   WEIGHT BEARING RESTRICTIONS: No  FALLS:  Has patient fallen in last 6 months? No  LIVING ENVIRONMENT: Lives with: lives with their family Lives in: House/apartment Stairs: did not ask Has following equipment at home: None  OCCUPATION: Did not ask  PLOF: Independent  PATIENT GOALS: less pain  NEXT MD VISIT: 11/08/2024  OBJECTIVE:  Note: Objective measures were completed at Evaluation unless otherwise noted.  DIAGNOSTIC FINDINGS:  Xray C-spine: No acute changes.  Slight straightening of the normal lordotic curve.   Some degenerative changes with slight narrowing at C6-C7  Xray R shoulder: No acute osseous changes.  She does have some early degenerative changes of the [facility] joint.  Glenohumeral joint is fairly well-preserved PATIENT SURVEYS :  Quick Dash:  QUICK DASH  Please rate your ability do the following activities in the last week by selecting the number below the appropriate response.   Activities Rating  Open a tight or new jar.  2 = Mild difficulty  Do heavy household chores (e.g., wash walls, floors). 4 = Severe difficulty  Carry a shopping bag or briefcase 2 = Mild difficulty  Wash your back. 3 = Moderate difficulty  Use a knife to cut food. 2 = Mild difficulty  Recreational activities in which you take some force or impact through your arm, shoulder or hand (e.g., golf, hammering, tennis, etc.). 2 = Mild difficulty  During the past week, to what extent has your arm, shoulder or hand problem interfered with your normal social activities with family, friends, neighbors or groups?  5 = Extremely  During the past week, were you limited in your  work or other regular daily activities as a result of your arm, shoulder or hand problem? 4 = Very limited  Rate the severity of the following symptoms in the last week: Arm, Shoulder, or hand pain. 4 = Severe  Rate the severity of the following symptoms in the last week: Tingling (pins and needles) in your arm, shoulder or hand. 3 = Moderate  During the past week, how much difficulty have you had sleeping because of the pain in your arm, shoulder or hand?  4 = Severe difficulty   (A QuickDASH score may not be calculated if there is greater than 1 missing item.)  Quick Dash Disability/Symptom Score: [(sum of 35 (n) responses/11 (n)] x 25 = 79  Minimally Clinically Important Difference (MCID): 15-20 points  (Franchignoni, F. et al. (2013). Minimally clinically important difference of the disabilities of the arm, shoulder, and hand outcome measures (DASH) and its shortened version (Quick DASH). Journal of Orthopaedic & Sports Physical Therapy, 44(1), 30-39)   COGNITION: Overall cognitive status: Within  functional limits for tasks assessed     SENSATION: Not tested  POSTURE: Protracted scapulas, forward head, rounded shoulders  CERVICAL ROM:   Active ROM A/PROM (deg) eval  Flexion WNL*  Extension WNL*  Right lateral flexion WNL*  Left lateral flexion WNL*  Right rotation WNL  Left rotation WNL*   (Blank rows = not tested)(* = pain)    UPPER EXTREMITY ROM:   Active/P ROM Right eval Left eval  Shoulder flexion 100* PROM 105*   Shoulder extension    Shoulder abduction 90   Shoulder adduction    Shoulder internal rotation    Shoulder external rotation 40* PROM 80*   Elbow flexion    Elbow extension    Wrist flexion    Wrist extension    Wrist ulnar deviation    Wrist radial deviation    Wrist pronation    Wrist supination    (Blank rows = not tested)(* = pain)   UPPER EXTREMITY MMT: deferred due to limited ROM and pain  MMT Right eval Left eval  Shoulder flexion     Shoulder extension    Shoulder abduction    Shoulder adduction    Shoulder internal rotation    Shoulder external rotation    Middle trapezius    Lower trapezius    Elbow flexion    Elbow extension    Wrist flexion    Wrist extension    Wrist ulnar deviation    Wrist radial deviation    Wrist pronation    Wrist supination    Grip strength (lbs)    (Blank rows = not tested)  JOINT MOBILITY TESTING:  Pain with inferior glenohumeral jt mobs; no pain with posterior  Pain with inferior and posterior AC jt mobs  PALPATION:  Tenderness over R anterior shoulder, AC joint, and distal clavicle    CERVICAL SPECIAL TESTS:  Spurling's test: Positive and Distraction test: Positive                                                                                                                             TREATMENT :  [facility] Adult PT Treatment:                                                DATE: 11/24/2024 Sub-occipital release and how to perform at home MTPR along cervical parapsinals and bil upper trap R elbow lateral mobs with movement grade III with active gripping Eccentric supination and wrist extension 1 x 20 with RTB Wrist extensor stretch 2 x 30 sec Lateral elbow grade V with cavitation noted UBE L5 x 4 min (FWD/ BWD x 2 min )  Shoulder IR/ER 2 x 15 with RTB  [facility] Adult PT Treatment:  DATE: 11/22/2024 Seated chin tuck 2  x 10 holding 5 seconds MTPR along the common wrist extensors MTPR along the R piriformis Figure four piriformis stretch 2 x 30 sec. Eccentric extensor activation 3 x 10# Wrist extensor stretch 2 x 30 sec Lateral elbow grade IV no cavitation noted Reviewed posture and sleeping positioning.  Reviewed and updated HEP today  [facility] Adult PT Treatment:                                                DATE: 11/15/2024 C3-C7 R lateral gapping mobs grade 3 MTPR along the R upper trap, levator and cervical  paraspinalss MTPR along the R elbow common extensor  Median nerve flossing in sitting Foam rolled tendon relief strap for elbow/ common extensors Wrist extensor stretch 2 x 30 sec Mechanical Traction  x 10 min , Mx pull 16, min pull 11, x 10 min with hold time of 60 sec x  [facility] Adult PT Treatment:                                                DATE: 11/10/2024 C3-C7 R lateral gapping mobs grade 3 Manual traction pt noted relief of sx in RUE MTPR along the R upper trap, levator and cervical paraspinas Supine chin tuck head lift 1 x 10 holding 5 sec Upper cervical flexion with rotation Mechanical Traction  x 10 min , Mx pull 16, min pull 11, x 10 min with hold time of 60 sec x  PATIENT EDUCATION: Education details: POC, HEP, diagnosis, prognosis.  Person educated: Patient Education method: Explanation, Demonstration, Tactile cues, Verbal cues, and Handouts Education comprehension: verbalized understanding, returned demonstration, verbal cues required, tactile cues required, and needs further education  HOME EXERCISE PROGRAM: Access Code: [email or web address] URL: [email or web address] Date: 11/24/2024 Prepared by: Joneen Fresh  Exercises - Seated Cervical Retraction  - 1-2 x daily - 7 x weekly - 3 sets - 10 reps - 5sec hold - Seated Scapular Retraction  - 1-2 x daily - 7 x weekly - 3 sets - 10 reps - 5sec hold - Seated Assisted Cervical Rotation with Towel  - 1 x daily - 7 x weekly - 1 sets - 10 reps - 5sec hold - Standing Isometric Shoulder Abduction with Doorway - Arm Bent  - 1 x daily - 7 x weekly - 2 sets - 10 reps - 5sec hold - Median Nerve Flossing - Tray  - 1 x daily - 7 x weekly - 2 sets - 10 reps - Wrist Extensor Stretch  - 2-3 x daily - 7 x weekly - 2 sets - 3 reps - 30-60 sec hold - Supine Figure 4 Piriformis Stretch  - 3 x daily - 7 x weekly - 2 sets - 2 reps - 30-60 segundos hold - Seated Eccentric Wrist Extension  - 1 x daily - 7 x weekly - 3 sets - 10-12 reps -  Shoulder Internal Rotation  - 1 x daily - 7 x weekly - 3 sets - 10-15 reps - Shoulder External Rotation with Anchored Resistance with Towel Under Elbow  - 1 x daily - 7 x weekly - 3 sets - 10-15 reps  ASSESSMENT:  CLINICAL IMPRESSION: 11/24/2024 Elyn  arrives to session noting improvement in her elbow and shoulder pain to 5/10 today. Continued focus on elbow to reduce potential compensation. She did well with all exercises and noted pain in the elbow and shoulder dropped to a 3-4/10 today. Plan to continued progressing with elbow and shoulder strengthening to maximize stability and efficent biomechanics.     EVAL: Patient is a 59 y.o. F who was seen today for physical therapy evaluation and treatment for R shoulder pain. The pt demonstrates limited and painful R shoulder ROM, weakness, painful cervical ROM, and postural dysfunction. With screening of the cervical spine, the pts main complaint of shoulder pain is likely being contributed by the cervical region. Her radiating pain is also likely due to her cervical spine. The pts functional limitations include carrying, lifting, reaching, and ADLs. The pt will benefit from skilled physical therapy to decrease pain and increase function.     OBJECTIVE IMPAIRMENTS: decreased ROM, decreased strength, hypomobility, impaired UE functional use, improper body mechanics, postural dysfunction, obesity, and pain.   ACTIVITY LIMITATIONS: carrying, lifting, sleeping, bed mobility, bathing, toileting, dressing, reach over head, and hygiene/grooming  PARTICIPATION LIMITATIONS: shopping, community activity, and yard work  PERSONAL FACTORS: Time since onset of injury/illness/exacerbation and 3+ comorbidities: Vitamin D  deficiency, migraines, anxiety, depression are also affecting patient's functional outcome.   REHAB POTENTIAL: Fair due to above listed factors  CLINICAL DECISION MAKING: Evolving/moderate complexity  EVALUATION COMPLEXITY:  Moderate  GOALS: Goals reviewed with patient? Yes  SHORT TERM GOALS: Target date: 11/25/2024  Pt will be compliant and independent with HEP to assist with symptom management/recovery at home.  Baseline: [id number] Goal status: INITIAL  2.  Pt will report no symptoms below her elbow for at least 1 week.  Baseline: symptoms below elbow  Goal status: INITIAL  3.  Pt will demonstrate at least 120 degrees AROM flexion and ABD. Baseline:  Active/P ROM Right eval  Shoulder flexion 100* PROM 105*  Shoulder extension   Shoulder abduction 90  Shoulder adduction   Shoulder internal rotation   Shoulder external rotation 40* PROM 80*   Goal status: INITIAL   LONG TERM GOALS: Target date: 12/23/2024  Pt will demonstrate at least 160 degrees AROM flexion and ABD. Baseline:  Active/P ROM Right eval  Shoulder flexion 100* PROM 105*  Shoulder extension   Shoulder abduction 90  Shoulder adduction 20  Shoulder internal rotation   Shoulder external rotation 40* PROM 80*   Goal status: INITIAL  2.  Pt will report pain no greater than 4/10 for at least 1 week. Baseline: 8/10  Goal status: INITIAL  3.  Pt will demonstrate 4/5 strength throughout the R shoulder.  Baseline: see objective Goal status: INITIAL  4.  Pt will be comfortable with her final HEP in order to continue any symptom management at home and to avoid regression.   Baseline:  [id number] Goal status: INITIAL   PLAN: PT FREQUENCY: 1-2x/week  PT DURATION: 8 weeks  PLANNED INTERVENTIONS: 97110-Therapeutic exercises, 97530- Therapeutic activity, [id number]- Neuromuscular re-education, 97535- Self Care, [id number]- Manual therapy, 97012- Traction (mechanical), Patient/Family education, Taping, Joint mobilization, and Joint manipulation  PLAN FOR NEXT SESSION: cervical stability, cervical and shoulder manual, shoulder ROM, shoulder stability. HEP review. Elbow and shoulder strengthening. Practice lifting mechanics ( child  care)   Joneen Fresh PT, DPT, LAT, ATC  11/24/24  11:03 AM     "

## 2024-11-28 ENCOUNTER — Ambulatory Visit: Admitting: Family Medicine

## 2024-11-29 ENCOUNTER — Ambulatory Visit: Admitting: Physical Therapy

## 2024-12-01 ENCOUNTER — Encounter: Payer: Self-pay | Admitting: Physical Therapy

## 2024-12-01 ENCOUNTER — Ambulatory Visit: Admitting: Physical Therapy

## 2024-12-01 DIAGNOSIS — G8929 Other chronic pain: Secondary | ICD-10-CM

## 2024-12-01 DIAGNOSIS — M5412 Radiculopathy, cervical region: Secondary | ICD-10-CM

## 2024-12-01 DIAGNOSIS — M542 Cervicalgia: Secondary | ICD-10-CM

## 2024-12-01 NOTE — Therapy (Signed)
 " OUTPATIENT PHYSICAL THERAPY TREATMENT   Patient Name: Kendra Rodriguez MRN: [id number] DOB:1966/05/15, 59 y.o., female Today's Date: 12/01/2024  END OF SESSION:  PT End of Session - 12/01/24 0932     Visit Number 6    Number of Visits 16    Date for Recertification  12/23/24    Authorization Type [employer] MCD    PT Start Time 0932    PT Stop Time 1014    PT Time Calculation (min) 42 min    Activity Tolerance Patient tolerated treatment well    Behavior During Therapy WFL for tasks assessed/performed               Past Medical History:  Diagnosis Date   Acute sinusitis    Allergy    Anxiety    Depression    NO MEDICATIONS - DOING OK NOW   Dizziness    Ear ache    RIGHT --HX OF MULTIPLE EAR INFECTIONS AS A CHILD    GERD (gastroesophageal reflux disease)    Headache(784.0)    MIGRAINES   Leg pain, left    with swelling   Lumbar stenosis    PAIN BACK AND LEFT LEG AND NUMBNESS LEG AND FOOT   Peripheral vascular disease    HX OF TREATMENT FOR VARICOSE VEINS RIGHT LEG   Thyroid  disease    TOLD BLOOD LEVELS SLIGHTLY ABNORMAL - BUT NOT REQUIRED MEDICATION   Past Surgical History:  Procedure Laterality Date   BACK SURGERY     bladder lift     AT SAME TIME OF HYSTERECTOMY   BRAVO PH STUDY N/A 07/26/2021   Procedure: BRAVO PH STUDY;  Surgeon: Rollin Dover, MD;  Location: [facility];  Service: Endoscopy;  Laterality: N/A;   CHOLECYSTECTOMY     ESOPHAGOGASTRODUODENOSCOPY (EGD) WITH PROPOFOL  N/A 07/26/2021   Procedure: ESOPHAGOGASTRODUODENOSCOPY (EGD) WITH PROPOFOL ;  Surgeon: Rollin Dover, MD;  Location: [facility];  Service: Endoscopy;  Laterality: N/A;   LUMBAR LAMINECTOMY/DECOMPRESSION MICRODISCECTOMY Left 10/24/2014   Procedure: COMPLETE LUMBAR DECOMPRESSION L4-L5 CENTRAL AND MICRODISCECTOMY L4-L5 LEFT;  Surgeon: Tanda DELENA Heading, MD;  Location: [facility];  Service: Orthopedics;  Laterality: Left;   POLYPECTOMY  07/26/2021   Procedure: POLYPECTOMY;   Surgeon: Rollin Dover, MD;  Location: [facility];  Service: Endoscopy;;   VAGINAL HYSTERECTOMY     Patient Active Problem List   Diagnosis Date Noted   Sinus headache 07/26/2024   Bacterial sinusitis 05/16/2024   Sinus congestion 05/16/2024   Raynaud's syndrome with gangrene (HCC) 09/15/2023   Chronic pain of left knee 07/30/2023   Chronic pelvic pain in female 03/09/2023   Chronic bilateral low back pain without sciatica 03/09/2023   Primary osteoarthritis of left knee 09/08/2022   Vitamin D  deficiency 09/08/2022   Hypothyroidism 11/29/2021   Body mass index (BMI) of 35.0-35.9 in adult 06/04/2020   History of migraine headaches 12/06/2019   Dyslipidemia 06/30/2019   GERD with esophagitis 06/30/2019   Chronic vertigo 04/13/2018   Prediabetes 10/17/2015   Spinal stenosis, lumbar region, with neurogenic claudication 10/24/2014    PCP: Purcell Emil Schanz, MD  REFERRING PROVIDER: Persons, Ronal Dragon, [place]  REFERRING DIAG: [phone] (ICD-10-CM) - Chronic right shoulder pain  THERAPY DIAG:  Chronic right shoulder pain  Neck pain  Radiculopathy, cervical region  Rationale for Evaluation and Treatment: Rehabilitation  ONSET DATE: 1 month ago  SUBJECTIVE:  SUBJECTIVE STATEMENT: 12/01/2024 I am doing good,the elbow is feeling better the exercises, the tennis ball is helping. Pain is 5/10 which is better than it has been.  Eval: Interpreter present Pt reports her shoulder pain started 1 month ago without trauma. She notes this pain goes into her elbow and hand with occasional R hand numbness. She had a CSI on 12/04 which has not changed her symptoms. After further questioning, the pt has chronic neck pain (R>L) that has worsened since the R shoulder pain.   Hand dominance: Right  PERTINENT  HISTORY: Vitamin D  deficiency, migraines, anxiety, depression  PAIN:  Are you having pain? Yes: NPRS scale: 5/10 elbow Pain location: R shoulder and neck Pain description: ache Aggravating factors: movement Relieving factors: heat, arthritis cream  PRECAUTIONS: None  RED FLAGS: None   WEIGHT BEARING RESTRICTIONS: No  FALLS:  Has patient fallen in last 6 months? No  LIVING ENVIRONMENT: Lives with: lives with their family Lives in: House/apartment Stairs: did not ask Has following equipment at home: None  OCCUPATION: Did not ask  PLOF: Independent  PATIENT GOALS: less pain  NEXT MD VISIT: 11/08/2024  OBJECTIVE:  Note: Objective measures were completed at Evaluation unless otherwise noted.  DIAGNOSTIC FINDINGS:  Xray C-spine: No acute changes.  Slight straightening of the normal lordotic curve.   Some degenerative changes with slight narrowing at C6-C7  Xray R shoulder: No acute osseous changes.  She does have some early degenerative changes of the [facility] joint.  Glenohumeral joint is fairly well-preserved PATIENT SURVEYS :  Quick Dash:  QUICK DASH  Please rate your ability do the following activities in the last week by selecting the number below the appropriate response.   Activities Rating  Open a tight or new jar.  2 = Mild difficulty  Do heavy household chores (e.g., wash walls, floors). 4 = Severe difficulty  Carry a shopping bag or briefcase 2 = Mild difficulty  Wash your back. 3 = Moderate difficulty  Use a knife to cut food. 2 = Mild difficulty  Recreational activities in which you take some force or impact through your arm, shoulder or hand (e.g., golf, hammering, tennis, etc.). 2 = Mild difficulty  During the past week, to what extent has your arm, shoulder or hand problem interfered with your normal social activities with family, friends, neighbors or groups?  5 = Extremely  During the past week, were you limited in your work or other regular daily  activities as a result of your arm, shoulder or hand problem? 4 = Very limited  Rate the severity of the following symptoms in the last week: Arm, Shoulder, or hand pain. 4 = Severe  Rate the severity of the following symptoms in the last week: Tingling (pins and needles) in your arm, shoulder or hand. 3 = Moderate  During the past week, how much difficulty have you had sleeping because of the pain in your arm, shoulder or hand?  4 = Severe difficulty   (A QuickDASH score may not be calculated if there is greater than 1 missing item.)  Quick Dash Disability/Symptom Score: [(sum of 35 (n) responses/11 (n)] x 25 = 79  Minimally Clinically Important Difference (MCID): 15-20 points  (Franchignoni, F. et al. (2013). Minimally clinically important difference of the disabilities of the arm, shoulder, and hand outcome measures (DASH) and its shortened version (Quick DASH). Journal of Orthopaedic & Sports Physical Therapy, 44(1), 30-39)   COGNITION: Overall cognitive status: Within functional limits for tasks assessed  SENSATION: Not tested  POSTURE: Protracted scapulas, forward head, rounded shoulders  CERVICAL ROM:   Active ROM A/PROM (deg) eval  Flexion WNL*  Extension WNL*  Right lateral flexion WNL*  Left lateral flexion WNL*  Right rotation WNL  Left rotation WNL*   (Blank rows = not tested)(* = pain)    UPPER EXTREMITY ROM:   Active/P ROM Right eval Left eval  Shoulder flexion 100* PROM 105*   Shoulder extension    Shoulder abduction 90   Shoulder adduction    Shoulder internal rotation    Shoulder external rotation 40* PROM 80*   Elbow flexion    Elbow extension    Wrist flexion    Wrist extension    Wrist ulnar deviation    Wrist radial deviation    Wrist pronation    Wrist supination    (Blank rows = not tested)(* = pain)   UPPER EXTREMITY MMT: deferred due to limited ROM and pain  MMT Right eval Left eval  Shoulder flexion    Shoulder extension     Shoulder abduction    Shoulder adduction    Shoulder internal rotation    Shoulder external rotation    Middle trapezius    Lower trapezius    Elbow flexion    Elbow extension    Wrist flexion    Wrist extension    Wrist ulnar deviation    Wrist radial deviation    Wrist pronation    Wrist supination    Grip strength (lbs)    (Blank rows = not tested)  JOINT MOBILITY TESTING:  Pain with inferior glenohumeral jt mobs; no pain with posterior  Pain with inferior and posterior AC jt mobs  PALPATION:  Tenderness over R anterior shoulder, AC joint, and distal clavicle    CERVICAL SPECIAL TESTS:  Spurling's test: Positive and Distraction test: Positive                                                                                                                             TREATMENT :  [facility] Adult PT Treatment:                                                DATE: 12/01/2024 Seated piriformis stretch 2 x 30 sec Standing repeated trunk extension 1 x 20 Prone on elbows 2 x 20 (noted centralization) Supine Chin tuck 10 x 10 sec x 2 sets MTPR along the R upper trap/ levator scapulae and scalenes Standing chin tuck with horizontal abduction , tactile cues for proper form 2 x 12 with RTB Reviewed posture with ADLS related to cervical positioning Scapular retaction with ER 2 x 10 maintaining chin tuck Reviewed and updated HEP.  [facility] Adult PT Treatment:  DATE: 11/24/2024 Sub-occipital release and how to perform at home MTPR along cervical parapsinals and bil upper trap R elbow lateral mobs with movement grade III with active gripping Eccentric supination and wrist extension 1 x 20 with RTB Wrist extensor stretch 2 x 30 sec Lateral elbow grade V with cavitation noted UBE L5 x 4 min (FWD/ BWD x 2 min )  Shoulder IR/ER 2 x 15 with RTB  [facility] Adult PT Treatment:                                                DATE: 11/22/2024 Seated chin tuck  2  x 10 holding 5 seconds MTPR along the common wrist extensors MTPR along the R piriformis Figure four piriformis stretch 2 x 30 sec. Eccentric extensor activation 3 x 10# Wrist extensor stretch 2 x 30 sec Lateral elbow grade IV no cavitation noted Reviewed posture and sleeping positioning.  Reviewed and updated HEP today  [facility] Adult PT Treatment:                                                DATE: 11/15/2024 C3-C7 R lateral gapping mobs grade 3 MTPR along the R upper trap, levator and cervical paraspinalss MTPR along the R elbow common extensor  Median nerve flossing in sitting Foam rolled tendon relief strap for elbow/ common extensors Wrist extensor stretch 2 x 30 sec Mechanical Traction  x 10 min , Mx pull 16, min pull 11, x 10 min with hold time of 60 sec x  PATIENT EDUCATION: Education details: POC, HEP, diagnosis, prognosis.  Person educated: Patient Education method: Explanation, Demonstration, Tactile cues, Verbal cues, and Handouts Education comprehension: verbalized understanding, returned demonstration, verbal cues required, tactile cues required, and needs further education  HOME EXERCISE PROGRAM: Access Code: [email or web address] URL: [email or web address] Date: 12/01/2024 Prepared by: Joneen Fresh  Exercises - Seated Cervical Retraction  - 1-2 x daily - 7 x weekly - 3 sets - 10 reps - 5sec hold - Seated Scapular Retraction  - 1-2 x daily - 7 x weekly - 3 sets - 10 reps - 5sec hold - Seated Assisted Cervical Rotation with Towel  - 1 x daily - 7 x weekly - 1 sets - 10 reps - 5sec hold - Standing Isometric Shoulder Abduction with Doorway - Arm Bent  - 1 x daily - 7 x weekly - 2 sets - 10 reps - 5sec hold - Median Nerve Flossing - Tray  - 1 x daily - 7 x weekly - 2 sets - 10 reps - Wrist Extensor Stretch  - 2-3 x daily - 7 x weekly - 2 sets - 3 reps - 30-60 sec hold - Supine Figure 4 Piriformis Stretch  - 3 x daily - 7 x weekly - 2 sets - 2 reps - 30-60  segundos hold - Seated Eccentric Wrist Extension  - 1 x daily - 7 x weekly - 3 sets - 10-12 reps - Shoulder Internal Rotation  - 1 x daily - 7 x weekly - 3 sets - 10-15 reps - Shoulder External Rotation with Anchored Resistance with Towel Under Elbow  - 1 x daily - 7 x weekly - 3 sets - 10-15 reps - Seated  Shoulder Horizontal Abduction with Resistance - Palms Down  - 1 x daily - 7 x weekly - 2-3 sets - 10-15 reps - Scapular retraction with ER (MONEY)  - 1 x daily - 7 x weekly - 2 - 3 sets - 10 -15 reps  ASSESSMENT:  CLINICAL IMPRESSION: 12/01/2024 Tyrika arrives to session noting in improvement in pain in the elbow and that overall her neck/ shoulder have remained at a 5/10. Continued working on lower Kinetic chain in the spine to reduce potential paraspinal involvement. Time was take to review posture and cervical position with her ADLs to avoid a forward head position which she required mod and tactile cues for proper form. With focused cervical retraction during exercises she needed additional cues to avoid hiking her R shoulder and continues to demonstrate upper trap dominance with activity. End of session she noted pain dropped from a 5/10 to a 3/10.    EVAL: Patient is a 59 y.o. F who was seen today for physical therapy evaluation and treatment for R shoulder pain. The pt demonstrates limited and painful R shoulder ROM, weakness, painful cervical ROM, and postural dysfunction. With screening of the cervical spine, the pts main complaint of shoulder pain is likely being contributed by the cervical region. Her radiating pain is also likely due to her cervical spine. The pts functional limitations include carrying, lifting, reaching, and ADLs. The pt will benefit from skilled physical therapy to decrease pain and increase function.     OBJECTIVE IMPAIRMENTS: decreased ROM, decreased strength, hypomobility, impaired UE functional use, improper body mechanics, postural dysfunction, obesity, and pain.    ACTIVITY LIMITATIONS: carrying, lifting, sleeping, bed mobility, bathing, toileting, dressing, reach over head, and hygiene/grooming  PARTICIPATION LIMITATIONS: shopping, community activity, and yard work  PERSONAL FACTORS: Time since onset of injury/illness/exacerbation and 3+ comorbidities: Vitamin D  deficiency, migraines, anxiety, depression are also affecting patient's functional outcome.   REHAB POTENTIAL: Fair due to above listed factors  CLINICAL DECISION MAKING: Evolving/moderate complexity  EVALUATION COMPLEXITY: Moderate  GOALS: Goals reviewed with patient? Yes  SHORT TERM GOALS: Target date: 11/25/2024  Pt will be compliant and independent with HEP to assist with symptom management/recovery at home.  Baseline: [email or web address] Goal status: INITIAL  2.  Pt will report no symptoms below her elbow for at least 1 week.  Baseline: symptoms below elbow  Goal status: INITIAL  3.  Pt will demonstrate at least 120 degrees AROM flexion and ABD. Baseline:  Active/P ROM Right eval  Shoulder flexion 100* PROM 105*  Shoulder extension   Shoulder abduction 90  Shoulder adduction   Shoulder internal rotation   Shoulder external rotation 40* PROM 80*   Goal status: INITIAL   LONG TERM GOALS: Target date: 12/23/2024  Pt will demonstrate at least 160 degrees AROM flexion and ABD. Baseline:  Active/P ROM Right eval  Shoulder flexion 100* PROM 105*  Shoulder extension   Shoulder abduction 90  Shoulder adduction 20  Shoulder internal rotation   Shoulder external rotation 40* PROM 80*   Goal status: INITIAL  2.  Pt will report pain no greater than 4/10 for at least 1 week. Baseline: 8/10  Goal status: INITIAL  3.  Pt will demonstrate 4/5 strength throughout the R shoulder.  Baseline: see objective Goal status: INITIAL  4.  Pt will be comfortable with her final HEP in order to continue any symptom management at home and to avoid regression.   Baseline:   [id number] Goal status: INITIAL   PLAN: PT  FREQUENCY: 1-2x/week  PT DURATION: 8 weeks  PLANNED INTERVENTIONS: 97110-Therapeutic exercises, 97530- Therapeutic activity, [id number]- Neuromuscular re-education, 97535- Self Care, [id number]- Manual therapy, 97012- Traction (mechanical), Patient/Family education, Taping, Joint mobilization, and Joint manipulation  PLAN FOR NEXT SESSION: cervical stability, cervical and shoulder manual, shoulder ROM, shoulder stability. HEP review. Elbow and shoulder strengthening. Practice lifting mechanics ( child care)   Joneen Fresh PT, DPT, LAT, ATC  12/01/24  11:02 AM     "

## 2024-12-05 ENCOUNTER — Ambulatory Visit: Admitting: Family Medicine

## 2024-12-06 ENCOUNTER — Ambulatory Visit: Admitting: Physical Therapy

## 2024-12-06 DIAGNOSIS — G8929 Other chronic pain: Secondary | ICD-10-CM

## 2024-12-06 DIAGNOSIS — M542 Cervicalgia: Secondary | ICD-10-CM

## 2024-12-06 DIAGNOSIS — M5412 Radiculopathy, cervical region: Secondary | ICD-10-CM

## 2024-12-13 ENCOUNTER — Ambulatory Visit: Admitting: Physical Therapy

## 2024-12-15 ENCOUNTER — Ambulatory Visit: Admitting: Physical Therapy

## 2024-12-20 ENCOUNTER — Ambulatory Visit: Admitting: Physical Therapy

## 2025-01-11 ENCOUNTER — Ambulatory Visit: Admitting: Family Medicine

## 2025-05-16 ENCOUNTER — Ambulatory Visit: Admitting: Endocrinology
